# Patient Record
Sex: Female | Born: 1935 | Race: White | Hispanic: No | Marital: Married | Smoking: Never smoker
Health system: Southern US, Community
[De-identification: ages and names within clinical notes are randomized; demographics above are authoritative.]

## PROBLEM LIST (undated history)

## (undated) DIAGNOSIS — I639 Cerebral infarction, unspecified: Secondary | ICD-10-CM

## (undated) DIAGNOSIS — E113593 Type 2 diabetes mellitus with proliferative diabetic retinopathy without macular edema, bilateral: Secondary | ICD-10-CM

## (undated) DIAGNOSIS — N390 Urinary tract infection, site not specified: Secondary | ICD-10-CM

## (undated) DIAGNOSIS — I350 Nonrheumatic aortic (valve) stenosis: Secondary | ICD-10-CM

## (undated) DIAGNOSIS — G459 Transient cerebral ischemic attack, unspecified: Secondary | ICD-10-CM

## (undated) DIAGNOSIS — G5600 Carpal tunnel syndrome, unspecified upper limb: Secondary | ICD-10-CM

## (undated) DIAGNOSIS — E785 Hyperlipidemia, unspecified: Secondary | ICD-10-CM

## (undated) DIAGNOSIS — S52532A Colles' fracture of left radius, initial encounter for closed fracture: Secondary | ICD-10-CM

## (undated) DIAGNOSIS — M1990 Unspecified osteoarthritis, unspecified site: Secondary | ICD-10-CM

## (undated) DIAGNOSIS — M21372 Foot drop, left foot: Secondary | ICD-10-CM

## (undated) DIAGNOSIS — I10 Essential (primary) hypertension: Secondary | ICD-10-CM

## (undated) DIAGNOSIS — K3189 Other diseases of stomach and duodenum: Secondary | ICD-10-CM

## (undated) DIAGNOSIS — F0781 Postconcussional syndrome: Secondary | ICD-10-CM

## (undated) DIAGNOSIS — I509 Heart failure, unspecified: Secondary | ICD-10-CM

## (undated) DIAGNOSIS — K920 Hematemesis: Secondary | ICD-10-CM

## (undated) DIAGNOSIS — E039 Hypothyroidism, unspecified: Secondary | ICD-10-CM

## (undated) DIAGNOSIS — I219 Acute myocardial infarction, unspecified: Secondary | ICD-10-CM

## (undated) DIAGNOSIS — N189 Chronic kidney disease, unspecified: Secondary | ICD-10-CM

## (undated) DIAGNOSIS — C44310 Basal cell carcinoma of skin of unspecified parts of face: Secondary | ICD-10-CM

## (undated) DIAGNOSIS — E119 Type 2 diabetes mellitus without complications: Secondary | ICD-10-CM

## (undated) DIAGNOSIS — I8500 Esophageal varices without bleeding: Secondary | ICD-10-CM

## (undated) DIAGNOSIS — I2510 Atherosclerotic heart disease of native coronary artery without angina pectoris: Secondary | ICD-10-CM

## (undated) DIAGNOSIS — K5710 Diverticulosis of small intestine without perforation or abscess without bleeding: Secondary | ICD-10-CM

## (undated) DIAGNOSIS — H3530 Unspecified macular degeneration: Secondary | ICD-10-CM

## (undated) DIAGNOSIS — E118 Type 2 diabetes mellitus with unspecified complications: Secondary | ICD-10-CM

## (undated) HISTORY — PX: FRACTURE SURGERY: SHX138

## (undated) HISTORY — DX: Nonrheumatic aortic (valve) stenosis: I35.0

## (undated) HISTORY — DX: Acute myocardial infarction, unspecified: I21.9

## (undated) HISTORY — DX: Unspecified osteoarthritis, unspecified site: M19.90

## (undated) HISTORY — DX: Carpal tunnel syndrome, unspecified upper limb: G56.00

## (undated) HISTORY — PX: TOTAL ABDOMINAL HYSTERECTOMY: SHX209

## (undated) HISTORY — PX: TOTAL KNEE ARTHROPLASTY: SHX125

## (undated) HISTORY — PX: APPENDECTOMY: SHX54

## (undated) HISTORY — DX: Hyperlipidemia, unspecified: E78.5

## (undated) HISTORY — DX: Type 2 diabetes mellitus without complications: E11.9

## (undated) HISTORY — DX: Essential (primary) hypertension: I10

## (undated) HISTORY — PX: HERNIA REPAIR: SHX51

## (undated) HISTORY — DX: Transient cerebral ischemic attack, unspecified: G45.9

## (undated) HISTORY — DX: Atherosclerotic heart disease of native coronary artery without angina pectoris: I25.10

## (undated) HISTORY — PX: ABDOMINAL HYSTERECTOMY: SHX81

## (undated) HISTORY — PX: JOINT REPLACEMENT: SHX530

## (undated) HISTORY — PX: CATARACT EXTRACTION: SUR2

## (undated) SURGERY — EGD (ESOPHAGOGASTRODUODENOSCOPY)
Anesthesia: Moderate Sedation

---
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
## 1999-01-15 ENCOUNTER — Inpatient Hospital Stay (HOSPITAL_COMMUNITY): Admission: EM | Admit: 1999-01-15 | Discharge: 1999-01-17 | Payer: Self-pay | Admitting: Emergency Medicine

## 1999-01-15 ENCOUNTER — Encounter: Payer: Self-pay | Admitting: Emergency Medicine

## 1999-01-17 ENCOUNTER — Encounter: Payer: Self-pay | Admitting: Emergency Medicine

## 1999-02-02 ENCOUNTER — Encounter: Payer: Self-pay | Admitting: Emergency Medicine

## 1999-02-02 ENCOUNTER — Inpatient Hospital Stay (HOSPITAL_COMMUNITY): Admission: EM | Admit: 1999-02-02 | Discharge: 1999-02-04 | Payer: Self-pay | Admitting: Emergency Medicine

## 1999-02-03 ENCOUNTER — Encounter: Payer: Self-pay | Admitting: Internal Medicine

## 1999-03-17 ENCOUNTER — Emergency Department (HOSPITAL_COMMUNITY): Admission: EM | Admit: 1999-03-17 | Discharge: 1999-03-17 | Payer: Self-pay | Admitting: Emergency Medicine

## 1999-03-17 ENCOUNTER — Encounter: Payer: Self-pay | Admitting: Emergency Medicine

## 1999-03-22 ENCOUNTER — Emergency Department (HOSPITAL_COMMUNITY): Admission: EM | Admit: 1999-03-22 | Discharge: 1999-03-22 | Payer: Self-pay | Admitting: *Deleted

## 1999-03-22 ENCOUNTER — Encounter: Payer: Self-pay | Admitting: Orthopedic Surgery

## 1999-03-22 ENCOUNTER — Ambulatory Visit (HOSPITAL_COMMUNITY): Admission: RE | Admit: 1999-03-22 | Discharge: 1999-03-22 | Payer: Self-pay | Admitting: Orthopedic Surgery

## 1999-03-23 ENCOUNTER — Emergency Department (HOSPITAL_COMMUNITY): Admission: EM | Admit: 1999-03-23 | Discharge: 1999-03-23 | Payer: Self-pay

## 1999-05-17 HISTORY — PX: HUMERUS FRACTURE SURGERY: SHX670

## 1999-05-21 ENCOUNTER — Ambulatory Visit (HOSPITAL_COMMUNITY): Admission: RE | Admit: 1999-05-21 | Discharge: 1999-05-21 | Payer: Self-pay | Admitting: Orthopedic Surgery

## 1999-05-21 ENCOUNTER — Encounter: Payer: Self-pay | Admitting: Orthopedic Surgery

## 1999-12-09 ENCOUNTER — Encounter: Admission: RE | Admit: 1999-12-09 | Discharge: 1999-12-09 | Payer: Self-pay | Admitting: Family Medicine

## 1999-12-09 ENCOUNTER — Encounter: Payer: Self-pay | Admitting: Family Medicine

## 2000-10-24 ENCOUNTER — Encounter: Payer: Self-pay | Admitting: Cardiology

## 2000-10-24 ENCOUNTER — Ambulatory Visit (HOSPITAL_COMMUNITY): Admission: RE | Admit: 2000-10-24 | Discharge: 2000-10-24 | Payer: Self-pay | Admitting: Cardiology

## 2001-06-06 ENCOUNTER — Encounter: Admission: RE | Admit: 2001-06-06 | Discharge: 2001-09-04 | Payer: Self-pay | Admitting: Internal Medicine

## 2001-09-13 HISTORY — PX: LAPAROSCOPIC CHOLECYSTECTOMY: SUR755

## 2001-10-01 ENCOUNTER — Ambulatory Visit (HOSPITAL_COMMUNITY): Admission: RE | Admit: 2001-10-01 | Discharge: 2001-10-01 | Payer: Self-pay | Admitting: Internal Medicine

## 2001-10-01 ENCOUNTER — Encounter (INDEPENDENT_AMBULATORY_CARE_PROVIDER_SITE_OTHER): Payer: Self-pay | Admitting: *Deleted

## 2001-10-01 ENCOUNTER — Inpatient Hospital Stay (HOSPITAL_COMMUNITY): Admission: EM | Admit: 2001-10-01 | Discharge: 2001-10-03 | Payer: Self-pay | Admitting: Emergency Medicine

## 2001-10-01 ENCOUNTER — Encounter: Payer: Self-pay | Admitting: Emergency Medicine

## 2001-10-01 ENCOUNTER — Encounter: Payer: Self-pay | Admitting: Internal Medicine

## 2001-10-02 ENCOUNTER — Encounter: Payer: Self-pay | Admitting: Surgery

## 2001-10-02 HISTORY — PX: LAPAROSCOPIC CHOLECYSTECTOMY: SUR755

## 2002-03-16 HISTORY — PX: LAPAROSCOPIC INCISIONAL / UMBILICAL / VENTRAL HERNIA REPAIR: SUR789

## 2002-03-26 ENCOUNTER — Inpatient Hospital Stay (HOSPITAL_COMMUNITY): Admission: RE | Admit: 2002-03-26 | Discharge: 2002-03-28 | Payer: Self-pay | Admitting: Surgery

## 2002-03-26 HISTORY — PX: LAPAROSCOPIC INCISIONAL / UMBILICAL / VENTRAL HERNIA REPAIR: SUR789

## 2004-03-18 ENCOUNTER — Ambulatory Visit: Payer: Self-pay | Admitting: Cardiology

## 2004-05-19 ENCOUNTER — Ambulatory Visit: Payer: Self-pay | Admitting: Family Medicine

## 2004-05-20 ENCOUNTER — Ambulatory Visit: Payer: Self-pay | Admitting: Family Medicine

## 2004-05-25 ENCOUNTER — Ambulatory Visit: Payer: Self-pay | Admitting: Family Medicine

## 2004-05-31 ENCOUNTER — Ambulatory Visit: Payer: Self-pay | Admitting: Cardiology

## 2004-05-31 ENCOUNTER — Encounter: Payer: Self-pay | Admitting: Family Medicine

## 2004-08-13 ENCOUNTER — Ambulatory Visit: Payer: Self-pay | Admitting: Cardiology

## 2004-08-31 ENCOUNTER — Ambulatory Visit: Payer: Self-pay | Admitting: Family Medicine

## 2004-09-01 ENCOUNTER — Ambulatory Visit: Payer: Self-pay | Admitting: Family Medicine

## 2004-12-24 ENCOUNTER — Ambulatory Visit: Payer: Self-pay | Admitting: Cardiology

## 2005-01-10 ENCOUNTER — Ambulatory Visit: Payer: Self-pay

## 2005-01-26 ENCOUNTER — Ambulatory Visit: Payer: Self-pay | Admitting: Cardiology

## 2005-07-20 ENCOUNTER — Ambulatory Visit: Payer: Self-pay | Admitting: Cardiology

## 2005-09-13 DIAGNOSIS — I2510 Atherosclerotic heart disease of native coronary artery without angina pectoris: Secondary | ICD-10-CM | POA: Diagnosis present

## 2005-09-13 HISTORY — PX: CORONARY ANGIOPLASTY WITH STENT PLACEMENT: SHX49

## 2005-09-13 HISTORY — DX: Atherosclerotic heart disease of native coronary artery without angina pectoris: I25.10

## 2005-10-04 ENCOUNTER — Inpatient Hospital Stay (HOSPITAL_COMMUNITY): Admission: EM | Admit: 2005-10-04 | Discharge: 2005-10-05 | Payer: Self-pay | Admitting: Emergency Medicine

## 2005-10-04 ENCOUNTER — Ambulatory Visit: Payer: Self-pay | Admitting: Cardiovascular Disease

## 2005-10-06 ENCOUNTER — Ambulatory Visit: Payer: Self-pay | Admitting: Cardiology

## 2005-10-06 ENCOUNTER — Inpatient Hospital Stay (HOSPITAL_COMMUNITY): Admission: EM | Admit: 2005-10-06 | Discharge: 2005-10-08 | Payer: Self-pay | Admitting: Emergency Medicine

## 2005-11-01 ENCOUNTER — Ambulatory Visit: Payer: Self-pay | Admitting: Cardiology

## 2005-12-27 ENCOUNTER — Ambulatory Visit (HOSPITAL_COMMUNITY): Admission: RE | Admit: 2005-12-27 | Discharge: 2005-12-27 | Payer: Self-pay | Admitting: Neurology

## 2006-01-23 ENCOUNTER — Encounter: Admission: RE | Admit: 2006-01-23 | Discharge: 2006-01-23 | Payer: Self-pay | Admitting: Family Medicine

## 2006-03-02 ENCOUNTER — Ambulatory Visit: Payer: Self-pay | Admitting: Cardiology

## 2006-03-06 ENCOUNTER — Encounter: Admission: RE | Admit: 2006-03-06 | Discharge: 2006-03-06 | Payer: Self-pay | Admitting: Internal Medicine

## 2006-04-10 ENCOUNTER — Ambulatory Visit: Payer: Self-pay | Admitting: Cardiology

## 2006-04-29 ENCOUNTER — Encounter: Admission: RE | Admit: 2006-04-29 | Discharge: 2006-04-29 | Payer: Self-pay | Admitting: Ophthalmology

## 2006-05-17 ENCOUNTER — Encounter: Admission: RE | Admit: 2006-05-17 | Discharge: 2006-05-17 | Payer: Self-pay | Admitting: Neurology

## 2006-06-14 ENCOUNTER — Ambulatory Visit: Payer: Self-pay

## 2006-06-20 ENCOUNTER — Ambulatory Visit: Payer: Self-pay | Admitting: Cardiology

## 2006-10-11 ENCOUNTER — Ambulatory Visit: Payer: Self-pay | Admitting: Cardiology

## 2006-10-16 ENCOUNTER — Emergency Department (HOSPITAL_COMMUNITY): Admission: EM | Admit: 2006-10-16 | Discharge: 2006-10-16 | Payer: Self-pay | Admitting: Emergency Medicine

## 2006-12-25 ENCOUNTER — Ambulatory Visit: Payer: Self-pay | Admitting: Cardiology

## 2007-01-01 ENCOUNTER — Inpatient Hospital Stay (HOSPITAL_COMMUNITY): Admission: RE | Admit: 2007-01-01 | Discharge: 2007-01-06 | Payer: Self-pay | Admitting: Orthopedic Surgery

## 2007-01-03 DIAGNOSIS — I2510 Atherosclerotic heart disease of native coronary artery without angina pectoris: Secondary | ICD-10-CM | POA: Insufficient documentation

## 2007-01-03 DIAGNOSIS — Z794 Long term (current) use of insulin: Secondary | ICD-10-CM

## 2007-01-03 DIAGNOSIS — E785 Hyperlipidemia, unspecified: Secondary | ICD-10-CM | POA: Insufficient documentation

## 2007-01-03 DIAGNOSIS — I10 Essential (primary) hypertension: Secondary | ICD-10-CM | POA: Insufficient documentation

## 2007-01-03 DIAGNOSIS — E119 Type 2 diabetes mellitus without complications: Secondary | ICD-10-CM

## 2007-01-07 ENCOUNTER — Emergency Department (HOSPITAL_COMMUNITY): Admission: EM | Admit: 2007-01-07 | Discharge: 2007-01-07 | Payer: Self-pay | Admitting: *Deleted

## 2007-01-14 ENCOUNTER — Ambulatory Visit: Payer: Self-pay | Admitting: Cardiology

## 2007-01-14 ENCOUNTER — Inpatient Hospital Stay (HOSPITAL_COMMUNITY): Admission: EM | Admit: 2007-01-14 | Discharge: 2007-01-17 | Payer: Self-pay | Admitting: Emergency Medicine

## 2007-01-29 ENCOUNTER — Ambulatory Visit: Payer: Self-pay | Admitting: Internal Medicine

## 2007-02-13 ENCOUNTER — Ambulatory Visit: Payer: Self-pay | Admitting: Cardiology

## 2007-03-26 ENCOUNTER — Emergency Department (HOSPITAL_COMMUNITY): Admission: EM | Admit: 2007-03-26 | Discharge: 2007-03-26 | Payer: Self-pay | Admitting: Emergency Medicine

## 2007-04-03 ENCOUNTER — Ambulatory Visit: Payer: Self-pay | Admitting: Cardiology

## 2007-05-17 HISTORY — PX: MEDIAN NERVE REPAIR: SHX2024

## 2007-07-30 ENCOUNTER — Ambulatory Visit: Payer: Self-pay | Admitting: Cardiology

## 2007-07-30 LAB — CONVERTED CEMR LAB
BUN: 13 mg/dL (ref 6–23)
Calcium: 9.3 mg/dL (ref 8.4–10.5)
Creatinine, Ser: 0.8 mg/dL (ref 0.4–1.2)
GFR calc Af Amer: 91 mL/min
Sodium: 139 meq/L (ref 135–145)

## 2008-02-12 ENCOUNTER — Ambulatory Visit (HOSPITAL_BASED_OUTPATIENT_CLINIC_OR_DEPARTMENT_OTHER): Admission: RE | Admit: 2008-02-12 | Discharge: 2008-02-12 | Payer: Self-pay | Admitting: Orthopedic Surgery

## 2008-02-19 ENCOUNTER — Ambulatory Visit: Payer: Self-pay | Admitting: Cardiology

## 2008-03-11 ENCOUNTER — Ambulatory Visit (HOSPITAL_BASED_OUTPATIENT_CLINIC_OR_DEPARTMENT_OTHER): Admission: RE | Admit: 2008-03-11 | Discharge: 2008-03-11 | Payer: Self-pay | Admitting: Orthopedic Surgery

## 2008-03-24 ENCOUNTER — Telehealth: Payer: Self-pay | Admitting: Family Medicine

## 2008-04-08 ENCOUNTER — Encounter: Payer: Self-pay | Admitting: Cardiology

## 2008-08-26 DIAGNOSIS — M1990 Unspecified osteoarthritis, unspecified site: Secondary | ICD-10-CM | POA: Insufficient documentation

## 2008-08-26 DIAGNOSIS — E039 Hypothyroidism, unspecified: Secondary | ICD-10-CM | POA: Insufficient documentation

## 2008-08-27 ENCOUNTER — Encounter: Payer: Self-pay | Admitting: Cardiology

## 2008-08-27 ENCOUNTER — Ambulatory Visit: Payer: Self-pay | Admitting: Cardiology

## 2008-10-27 ENCOUNTER — Encounter (INDEPENDENT_AMBULATORY_CARE_PROVIDER_SITE_OTHER): Payer: Self-pay | Admitting: *Deleted

## 2009-01-26 ENCOUNTER — Telehealth: Payer: Self-pay | Admitting: Cardiology

## 2009-01-27 ENCOUNTER — Encounter (INDEPENDENT_AMBULATORY_CARE_PROVIDER_SITE_OTHER): Payer: Self-pay | Admitting: *Deleted

## 2009-02-04 ENCOUNTER — Telehealth: Payer: Self-pay | Admitting: Cardiology

## 2009-03-11 ENCOUNTER — Ambulatory Visit: Payer: Self-pay | Admitting: Cardiology

## 2009-04-03 ENCOUNTER — Encounter: Admission: RE | Admit: 2009-04-03 | Discharge: 2009-04-03 | Payer: Self-pay | Admitting: Internal Medicine

## 2009-04-13 ENCOUNTER — Encounter: Payer: Self-pay | Admitting: Cardiology

## 2009-05-16 DIAGNOSIS — I219 Acute myocardial infarction, unspecified: Secondary | ICD-10-CM

## 2009-05-16 HISTORY — DX: Acute myocardial infarction, unspecified: I21.9

## 2009-08-19 ENCOUNTER — Telehealth (INDEPENDENT_AMBULATORY_CARE_PROVIDER_SITE_OTHER): Payer: Self-pay | Admitting: *Deleted

## 2009-10-28 ENCOUNTER — Ambulatory Visit: Payer: Self-pay

## 2009-10-28 ENCOUNTER — Encounter: Payer: Self-pay | Admitting: Cardiology

## 2009-10-28 DIAGNOSIS — M542 Cervicalgia: Secondary | ICD-10-CM | POA: Insufficient documentation

## 2010-03-03 ENCOUNTER — Ambulatory Visit: Payer: Self-pay | Admitting: Cardiology

## 2010-03-12 ENCOUNTER — Ambulatory Visit: Payer: Self-pay | Admitting: Internal Medicine

## 2010-03-12 ENCOUNTER — Ambulatory Visit: Payer: Self-pay

## 2010-03-12 ENCOUNTER — Encounter: Payer: Self-pay | Admitting: Cardiology

## 2010-03-12 ENCOUNTER — Ambulatory Visit (HOSPITAL_COMMUNITY): Admission: RE | Admit: 2010-03-12 | Discharge: 2010-03-12 | Payer: Self-pay | Admitting: Cardiology

## 2010-03-16 ENCOUNTER — Encounter: Payer: Self-pay | Admitting: Cardiology

## 2010-04-26 ENCOUNTER — Telehealth (INDEPENDENT_AMBULATORY_CARE_PROVIDER_SITE_OTHER): Payer: Self-pay | Admitting: *Deleted

## 2010-05-03 ENCOUNTER — Ambulatory Visit (HOSPITAL_COMMUNITY)
Admission: RE | Admit: 2010-05-03 | Discharge: 2010-05-11 | Disposition: A | Discharge status: Rehabilitation Facility | Payer: Self-pay | Source: Home / Self Care | Attending: Cardiology | Admitting: Cardiology

## 2010-05-03 ENCOUNTER — Inpatient Hospital Stay (HOSPITAL_COMMUNITY)
Admission: RE | Admit: 2010-05-03 | Discharge: 2010-05-11 | Disposition: A | Discharge status: Rehabilitation Facility | Payer: Self-pay | Source: Home / Self Care | Attending: Orthopedic Surgery | Admitting: Orthopedic Surgery

## 2010-05-03 ENCOUNTER — Encounter: Payer: Self-pay | Admitting: Cardiology

## 2010-05-11 ENCOUNTER — Inpatient Hospital Stay (HOSPITAL_COMMUNITY)
Admission: RE | Admit: 2010-05-11 | Discharge: 2010-05-18 | Payer: Self-pay | Attending: Physical Medicine & Rehabilitation | Admitting: Physical Medicine & Rehabilitation

## 2010-05-16 DIAGNOSIS — I219 Acute myocardial infarction, unspecified: Secondary | ICD-10-CM

## 2010-05-16 HISTORY — DX: Acute myocardial infarction, unspecified: I21.9

## 2010-05-31 ENCOUNTER — Encounter (INDEPENDENT_AMBULATORY_CARE_PROVIDER_SITE_OTHER): Payer: Self-pay | Admitting: *Deleted

## 2010-06-01 ENCOUNTER — Encounter: Payer: Self-pay | Admitting: Cardiology

## 2010-06-01 ENCOUNTER — Other Ambulatory Visit: Payer: Self-pay | Admitting: Cardiology

## 2010-06-01 ENCOUNTER — Ambulatory Visit
Admission: RE | Admit: 2010-06-01 | Discharge: 2010-06-01 | Payer: Self-pay | Source: Home / Self Care | Attending: Cardiology | Admitting: Cardiology

## 2010-06-02 LAB — BASIC METABOLIC PANEL
BUN: 15 mg/dL (ref 6–23)
CO2: 24 mEq/L (ref 19–32)
Calcium: 9.2 mg/dL (ref 8.4–10.5)
Chloride: 106 mEq/L (ref 96–112)
Creatinine, Ser: 0.9 mg/dL (ref 0.4–1.2)
GFR: 68.62 mL/min (ref >60.00)
Glucose, Bld: 194 mg/dL — ABNORMAL HIGH (ref 70–99)
Potassium: 4.5 mEq/L (ref 3.5–5.1)
Sodium: 139 mEq/L (ref 135–145)

## 2010-06-02 LAB — CBC WITH DIFFERENTIAL/PLATELET
Basophils Absolute: 0 10*3/uL (ref 0.0–0.1)
Basophils Relative: 0.1 % (ref 0.0–3.0)
Eosinophils Absolute: 0.2 10*3/uL (ref 0.0–0.7)
Eosinophils Relative: 3.3 % (ref 0.0–5.0)
HCT: 37.4 % (ref 36.0–46.0)
Hemoglobin: 12.7 g/dL (ref 12.0–15.0)
Lymphocytes Relative: 38.6 % (ref 12.0–46.0)
Lymphs Abs: 2.8 10*3/uL (ref 0.7–4.0)
MCHC: 33.8 g/dL (ref 30.0–36.0)
MCV: 95.7 fl (ref 78.0–100.0)
Monocytes Absolute: 0.1 10*3/uL (ref 0.1–1.0)
Monocytes Relative: 1.4 % — ABNORMAL LOW (ref 3.0–12.0)
Neutro Abs: 4.1 10*3/uL (ref 1.4–7.7)
Neutrophils Relative %: 56.6 % (ref 43.0–77.0)
Platelets: 229 10*3/uL (ref 150.0–400.0)
RBC: 3.91 Mil/uL (ref 3.87–5.11)
RDW: 15.2 % — ABNORMAL HIGH (ref 11.5–14.6)
WBC: 7.2 10*3/uL (ref 4.5–10.5)

## 2010-06-03 ENCOUNTER — Telehealth: Payer: Self-pay | Admitting: Cardiology

## 2010-06-04 NOTE — Discharge Summary (Signed)
Tonya Hunter, SPILLMAN NO.:  [id number]  MEDICAL RECORD NO.:  [id number]          PATIENT TYPE:  IPS  LOCATION:  3299                         FACILITY:  [facility]  PHYSICIAN:  Meredith Staggers, M.D.DATE OF BIRTH:  March 02, 1935  DATE OF ADMISSION:  05/11/2010 DATE OF DISCHARGE:  05/18/2010                              DISCHARGE SUMMARY   DISCHARGE DIAGNOSES: 1. End-stage osteoarthritis, left knee requiring left total knee     replacement. 2. Postop ST-elevation myocardial infarction with stent placement. 3. Hypertension. 4. Dyslipidemia. 5. Diabetes mellitus. 6. Acute blood loss anemia.  HISTORY OF PRESENT ILLNESS:  [name] is a 76 year old female with history of DM type 2, coronary artery disease, OA left knee who elected to undergo left total knee replacement on May 03, 2010 by Dr. Wynelle Link.  In PACU, the patient developed chest pain with ST changes due to STEMI.  She was taken to cath lab emergently for PTCA stent of LAD by Dr. Lia Foyer as treatment for her anterior wall MI.  Postprocedure, the patient is on aspirin and Plavix.  She also continues on subcu Lovenox for DVT prophylaxis.  She has had issues with pain and numbness in left calf past 24 hours but bilateral lower extremity Dopplers done showed no evidence of DVT.  Therapies were initiated and the patient is noted to have decreased posture requiring cues to weight shift on lower right lower extremity.  Continues to have issues with elevated anxiety level. The patient was evaluated by rehab and felt that she would benefit from a CIR program.  PAST MEDICAL HISTORY: 1. Coronary artery disease with PTCA 4 years ago. 2. DM type 2. 3. Hypothyroid. 4. Hysterectomy and BSO. 5. Right total knee replacement in 2008. 6. Bilateral carpal tunnel release. 7. Appendectomy. 8. Diverticulosis. 9. Cholecystectomy. 10.Left retinal tear repair. 11.Left shoulder repair. 12.Ventral hernia repair.  ALLERGIES:   PENICILLIN and DEMEROL.  REVIEW OF SYMPTOMS:  Positive for weakness, insomnia, anxiety, and constipation.  FAMILY HISTORY:  Positive for diabetes, coronary artery disease, and cancer.  SOCIAL HISTORY:  The patient is married, lives in 1-level home with two steps at entry.  Does not use any tobacco or alcohol.  Has a supportive husband.  FUNCTIONAL HISTORY:  The patient was independent prior to admission without assisted device.  FUNCTIONAL STATUS:  The patient is min assist transfers.  Min assist ambulating 15-33 feet with a rolling walker.  Min guard assist for upper body care, mod to total assist lower body care.  She has 55 degrees flexion with 5 degrees extension on left lower extremity.  HOSPITAL COURSE:  Tonya Hunter was admitted to rehab on May 11, 2010 for inpatient therapies to consist of PT, OT at least 3 hours 5 days a week.  Past admission, physiatrist, rehab RN, and therapy team have worked together to provide customized and collaborative interdisciplinary care.  The patient's blood pressures were checked on b.i.d. basis during this stay.  These have shown reasonable control and have ranged from 242A-834H systolics with an occasional high at 140s. Diastolic in 96Q-22L range.  Heart rate has been stable in  60-70 range. The patient's p.o. intake has been monitored along and is slowly been improving.  Blood sugars were monitored with before meals and at bedtime CBG checks.  Her Lantus insulin was titrated up to her home dose. Sliding scale insulin NovoLog was used for elevated blood sugars. Currently blood sugars are ranging from 120s-160s.  The patient reports using 50 units of NovoLog q.a.m. only.  She was advised that this would be too higher dose and wondered whether she needed to split this dose. She is advised to hold her NovoLog for now.  Check blood sugars on before meals and at bedtime basis and contact Dr. Jacquiline Doe office for adjustment in her  hypoglycemic regimen.  The patient's knee incision has been monitored along.  She was noted to have a deep edema with ecchymosis, left proximal thigh and left calf.  Left knee incision is clean, dry, and intact with Steri-Strips in place.  Edema in left knee and ecchymosis much improved by the time of discharge.  Pain in left calf has also almost resolved.  The patient has had some issues with chest discomfort with attempts at range of motion on left knee.  [facility] was contacted for input.  EKG done showed no significant changes.  PY2 platelet assay was done showing 95% inhibition. Cardiology felt that the patient's symptoms are more due to anxiety and eager support has been ongoing throughout her stay.  The patient continues to use Xanax on b.i.d. p.r.n. basis.  She is advised to follow up with Dr. [name] for long-term antianxiety medicine if her anxiety does not improve. Labs done past admission reveal sodium 136, potassium 3.9, chloride 102, CO2 27, glucose 114, BUN 9, Chloride 27, alkaline phosphatase 152, albumin 2.6.  CBC revealed hemoglobin 9.4, hematocrit 28.9, WBC 9.3 and platelets 363.  During the patient's stay in rehab, weekly team conference was held to monitor the patient's progress, set goals as well as discuss barriers to discharge.  At admission, the patient was limited by significant left knee pain and contracture, left ankle pain with contracture as well as left lower extremity edema.  She was noted to have decreased muscular endurance, decreased activity tolerance as well as decrease in left upper and lower extremity strength.  The patient was at supervision level for bed mobility to low supervision for ambulating short distances.  She required min assist to navigate around obstacles.  She did require cuing to get the left heel flat on the floor to help with her overall mobility.  The patient progressed to being modified independent for ambulation in a  controlled area.  She is able to navigate 8 steps with supervision and cuing for sequencing.  The patient declined attempts to progress to a cane.  Family education has been done with husband and the patient discharged to home at modified independent level for mobility in a controlled setting.  OT has worked with the patient on self-care tasks.  The patient was overall at supervision level for ADLs.  Attempts were made to get the patient into the shower to help progress towards independence in bathing and dressing.  The patient has declined to do this and has preferred to do sponge baths. Attempts were also made to progress the patient to home tasks.  However, the patient has declined this as she reports her husband will do these activities.  Currently, the patient is modified independent for toileting and toilet transfers.  She will have 24-hour supervision assistance from husband for her ADL  tasks.  Further followup home health PT, OT has been set up through [facility].  The patient was maintained on subcu Lovenox throughout her stay.  It was discontinued at the time of discharge.  Home Health RN has been arranged for wound care monitoring.  On May 18, 2010, the patient is discharged to home.  DISCHARGE MEDICATIONS: 1. Xanax 0.25 mg b.i.d. p.r.n. 2. Ecotrin 325 mg per day. 3. Nu-Iron 150 mg b.i.d. 4. Robaxin 500 mg p.o. q.6 h. p.r.n. spasms. 5. Percocet 5/325 one p.o. q.4 h. p.r.n. moderate-to-severe pain, #50     Rx. 6. Senokot-S 2 p.o. at bedtime. 7. Lasix 40 mg 1/2 p.o. per day. 8. Metoprolol 50 mg p.o. b.i.d., note increased dose. 9. KCl 10 mEq 2 tablets p.o. per day. 10.Lantus insulin 15 units subcu at bedtime. 11.Levothyroxine 200 mcg p.o. per day. 12.Metformin XR 1000 mg p.o. at bedtime. 13.Plavix 75 mg a day. 14.Vytorin 10-20 p.o. per day.  DIET:  Carb-modified medium.  ACTIVITY LEVEL:  Supervision for mobility, assistance with ADLs. Continue to  use walker for now.  SPECIAL INSTRUCTIONS:  No alcohol, no smoking, and no driving.  Do not use Humalog insulin for now.  Do not use Imdur.  [facility] to provide PT, OT, and RN.  Check blood sugars before meals and at bedtime basis for now.  FOLLOWUP:  The patient is to follow up with Dr. Naaman Plummer as needed. Follow up with Dr. Wynelle Link for postop check.  Follow up with Dr. Lia Foyer in 2 weeks.  Follow up with Dr. [name] in the next couple of weeks for input regarding diabetes management as well as issues regarding anxiety if these continue.     [name], P.A.   ______________________________ Meredith Staggers, M.D.    PL/MEDQ  D:  05/19/2010  T:  05/19/2010  Job:  [id number]  cc:   [name], M.D. Loretha Brasil. Lia Foyer, MD, [facility] Gaynelle Arabian, M.D.  Electronically Signed by Joline Maxcy. on 05/20/2010 08:46:36 AM Electronically Signed by Alger Simons M.D. on 06/04/2010 09:53:16 AM

## 2010-06-15 NOTE — Assessment & Plan Note (Signed)
Summary: f1y   Visit Type:  1 year follow up Primary Provider:  [name]  CC:  Sob.  History of Present Illness: Doing well. Denies chest pain.  Has mild shortness of breath with exertion.  Overall her leg is so bad she cannot go on any further.   Does not want to have another myoview  "nearly killed me".  No other major symptoms.  Current Medications (verified): 1)  Metoprolol Tartrate 50 Mg Tabs (Metoprolol Tartrate) .Marland Kitchen.. 1 Tablet in The Morning and 1/2 Tablet in The Afternoon 2)  Isosorbide Mononitrate Cr 60 Mg  Tb24 (Isosorbide Mononitrate) .... Take 1 Tablet By Mouth Once A Day 3)  Furosemide 40 Mg  Tabs (Furosemide) .... Take 1 Tablet By Mouth Once A Day 4)  Synthroid 200 Mcg  Tabs (Levothyroxine Sodium) .... Take 1 Tablet By Mouth Once A Day 5)  Humalog 100 Unit/ml Soln (Insulin Lispro (Human)) .... 50 Units Every Morning 6)  Vytorin 10-20 Mg Tabs (Ezetimibe-Simvastatin) .... Take One Tablet By Mouth Daily At Bedtime 7)  Plavix 75 Mg Tabs (Clopidogrel Bisulfate) .... Take One Tablet By Mouth Daily 8)  Metformin Hcl 500 Mg Tabs (Metformin Hcl) .... Take 4 Tablets At Night 9)  Lantus 100 Unit/ml Soln (Insulin Glargine) .... 50 Units At Night 10)  Aspirin Ec 325 Mg Tbec (Aspirin) .... Take One Tablet By Mouth Daily 11)  Potassium Chloride Cr 10 Meq Cr-Caps (Potassium Chloride) .... Take One Tablet By Mouth Daily 12)  Nitrostat 0.4 Mg Subl (Nitroglycerin) .Marland Kitchen.. 1 Tablet Under Tongue At Onset of Chest Pain; You May Repeat Every 5 Minutes For Up To 3 Doses.  Allergies: 1)  ! Pcn 2)  ! Demerol  Past History:  Past Medical History: Last updated: 08/26/2008 CORONARY ARTERY DISEASE (ICD-414.00)-status post Taxus drug-eluting stent  placement to the left anterior descending artery in May 2007. Marland Kitchen Hypertension.  DIABETES MELLITUS, TYPE II (ICD-250.00) HYPERLIPIDEMIA (ICD-272.4) HYPERTENSION (ICD-401.9) OSTEOARTHRITIS (ICD-715.90) HYPOTHYROIDISM, HX OF (ICD-V12.2) FAMILY HISTORY  DIABETES 1ST DEGREE RELATIVE (ICD-V18.0)  Past Surgical History: Last updated: 08/26/2008  Decompression, right median nerve- 2009  Decompression, left median nerve-2009 :appendectomy,  ,cataract  surgery,  cholecystectomy, hernia   repair, hysterectomy, knee surgery  Taxus drug-eluting stent   placement to the left anterior descending artery in May 2007.         Family History: Last updated: 08/26/2008 Family History Diabetes 1st degree relative Family History Hypertension  Is insignificant for premature coronary disease.      Social History: Last updated: 08/26/2008 The patient is married.  She has two children.  She   denies tobacco or alcohol abuse.  She is an Web designer at   [employer].   Vital Signs:  Patient profile:   76 year old female Height:      66 inches Weight:      222.50 pounds BMI:     36.04 Pulse rate:   66 / minute Pulse rhythm:   regular Resp:     18 per minute BP sitting:   129 / 69  (left arm) Cuff size:   large  Vitals Entered By: Sidney Ace (March 03, 2010 8:54 AM)  Physical Exam  General:  Well developed, well nourished, in no acute distress. Head:  normocephalic and atraumatic Eyes:  PERRLA/EOM intact; conjunctiva and lids normal. Lungs:  Clear bilaterally to auscultation and percussion. Heart:  PMI non displaced.  Normal S1 and S2.  Minimal SEM.  No DM.  Abdomen:  Bowel sounds positive; abdomen  soft and non-tender without masses, organomegaly, or hernias noted. No hepatosplenomegaly. Pulses:  pulses normal in all 4 extremities Extremities:  No clubbing or cyanosis. Neurologic:  Alert and oriented x 3.   Cardiac Cath  Procedure date:  10/06/2005  Findings:       ANGIOGRAPHIC DATA:  1.  The left main is free of critical disease.  2.  The left anterior descending artery is a moderately calcified vessel.      There is about a 40% area of eccentric plaquing just prior to the      stented site which is just after the  diagonal.  Distal to the stent      site, there is some mild luminal irregularity.  Proximal diagonal branch      has about 40-50% narrowing in its ostium as well, although it does not      appear to be critical.  The LAD appears to be slightly worse in the LAO      cranial view, but it does not appear to be any different than at the      time of the procedure itself and I would grade it at no more than 50% as      noted.  3.  The circumflex provides a large first marginal branch whose terminal      branches have subtotal occlusions of 90%.  This is similar to what she      had previously.  It is fairly distal.  The second marginal branch is      large in caliber and free of critical disease.  4.  The right coronary artery also has diffuse luminal irregularity with      some calcification and about 30-40% narrowing distally prior to the PDA      takeoff.  This is eccentric.   EKG  Procedure date:  03/03/2010  Findings:      NSR.  RBBB.  Impression & Recommendations:  Problem # 1:  CORONARY ARTERY DISEASE (ICD-414.00)  Stable.  no incremental symptoms.  She states she has to have surgery, and cannot have any stress  "last one almost killed me".  Therefore, will do echo and review LV.  No high risk features.  She can reduce ASA to 13m, and hold Plavix 5 days before surgery.   Her updated medication list for this problem includes:    Metoprolol Tartrate 50 Mg Tabs (Metoprolol tartrate) ..Marland Kitchen.. 1 tablet in the morning and 1/2 tablet in the afternoon    Isosorbide Mononitrate Cr 60 Mg Tb24 (Isosorbide mononitrate) ..Marland Kitchen.. Take 1 tablet by mouth once a day    Plavix 75 Mg Tabs (Clopidogrel bisulfate) ..Marland Kitchen.. Take one tablet by mouth daily    Aspirin 81 Mg Tbec (Aspirin) ..Marland Kitchen.. Take one tablet by mouth daily    Nitrostat 0.4 Mg Subl (Nitroglycerin) ..Marland Kitchen.. 1 tablet under tongue at onset of chest pain; you may repeat every 5 minutes for up to 3 doses.  Orders: EKG w/ Interpretation  (93000) Echocardiogram (Echo)  Problem # 2:  HYPERLIPIDEMIA (ICD-272.4)  Followed by her primary. Her updated medication list for this problem includes:    Vytorin 10-20 Mg Tabs (Ezetimibe-simvastatin) ..Marland Kitchen.. Take one tablet by mouth daily at bedtime  Her updated medication list for this problem includes:    Vytorin 10-20 Mg Tabs (Ezetimibe-simvastatin) ..Marland Kitchen.. Take one tablet by mouth daily at bedtime  Orders: EKG w/ Interpretation (93000) Echocardiogram (Echo)  Problem # 3:  DIABETES MELLITUS, TYPE II (ICD-250.00) Managed by  Dr. [name]. Her updated medication list for this problem includes:    Humalog 100 Unit/ml Soln (Insulin lispro (human)) .Marland KitchenMarland KitchenMarland KitchenMarland Kitchen 50 units every morning    Metformin Hcl 500 Mg Tabs (Metformin hcl) .Marland Kitchen... Take 4 tablets at night    Lantus 100 Unit/ml Soln (Insulin glargine) .Marland KitchenMarland KitchenMarland KitchenMarland Kitchen 50 units at night    Aspirin 81 Mg Tbec (Aspirin) .Marland Kitchen... Take one tablet by mouth daily  Problem # 4:  OSTEOARTHRITIS (ICD-715.90) requires surgery.  Patient Instructions: 1)  Your physician has recommended you make the following change in your medication: DECREASE Aspirin to 41m once a day 2)  Your physician wants you to follow-up in: 6 MONTHS.  You will receive a reminder letter in the mail two months in advance. If you don't receive a letter, please call our office to schedule the follow-up appointment. 3)  Your physician has requested that you have an echocardiogram.  Echocardiography is a painless test that uses sound waves to create images of your heart. It provides your doctor with information about the size and shape of your heart and how well your heart's chambers and valves are working.  This procedure takes approximately one hour. There are no restrictions for this procedure. 4)  Prior to surgery you may hold your Plavix for 5 days.  Do not stop Aspirin for surgery.

## 2010-06-15 NOTE — Progress Notes (Signed)
Summary: refill meds  Phone Note Refill Request Call back at Home Phone [phone] Message from:  Pharmacy on August 19, 2009 4:18 PM  Refills Requested: Medication #1:  PLAVIX 75 MG TABS Take one tablet by mouth daily [facility] [address] [phone]   Method Requested: Fax to [facility] Initial call taken by: Neil Crouch,  August 19, 2009 4:18 PM  Follow-up for Phone Call        Rx faxed to pharmacy Follow-up by: Sidney Ace,  August 19, 2009 4:42 PM    Prescriptions: PLAVIX 75 MG TABS (CLOPIDOGREL BISULFATE) Take one tablet by mouth daily  #90 x 3   Entered by:   Sidney Ace   Authorized by:   Wadie Lessen, MD, [facility]   Signed by:   Sidney Ace on 08/19/2009   Method used:   Faxed to ...       [facility] [id number] (retail)       [place]       [place],   [address]       Ph: [phone] or [phone]       Fax: [phone]   RxID:   [id number]

## 2010-06-15 NOTE — Assessment & Plan Note (Signed)
Summary: tightness in neck  Nurse Visit   Vital Signs:  Patient profile:   76 year old female Weight:      219 pounds Pulse rate:   78 / minute BP sitting:   120 / 66  (left arm) Cuff size:   large  Vitals Entered By: Theodosia Quay, RN, BSN (October 28, 2009 9:32 AM)  Current Medications (verified): 1)  Metoprolol Tartrate 50 Mg Tabs (Metoprolol Tartrate) .Marland Kitchen.. 1 Tablet in The Morning and 1/2 Tablet in The Afternoon 2)  Isosorbide Mononitrate Cr 60 Mg  Tb24 (Isosorbide Mononitrate) .... Take 1 Tablet By Mouth Once A Day 3)  Furosemide 40 Mg  Tabs (Furosemide) .... Take 1 Tablet By Mouth Once A Day 4)  Synthroid 200 Mcg  Tabs (Levothyroxine Sodium) .... Take 1 Tablet By Mouth Once A Day 5)  Humalog 100 Unit/ml Soln (Insulin Lispro (Human)) .... 50 Units Every Morning 6)  Vytorin 10-20 Mg Tabs (Ezetimibe-Simvastatin) .... Take One Tablet By Mouth Daily At Bedtime 7)  Plavix 75 Mg Tabs (Clopidogrel Bisulfate) .... Take One Tablet By Mouth Daily 8)  Metformin Hcl 500 Mg Tabs (Metformin Hcl) .... Take 4 Tablets At Night 9)  Lantus 100 Unit/ml Soln (Insulin Glargine) .... 50 Units At Night 10)  Aspirin Ec 325 Mg Tbec (Aspirin) .... Take One Tablet By Mouth Daily 11)  Potassium Chloride Cr 10 Meq Cr-Caps (Potassium Chloride) .... Take One Tablet By Mouth Daily 12)  Nitrostat 0.4 Mg Subl (Nitroglycerin) .Marland Kitchen.. 1 Tablet Under Tongue At Onset of Chest Pain; You May Repeat Every 5 Minutes For Up To 3 Doses.  Allergies (verified): 1)  ! Pcn 2)  ! Demerol  Impression & Recommendations:  Problem # 1:  NECK PAIN (ICD-723.1) The pt walked into the office this morning with c/o fatigue and left neck and should pain. The pt denies CP.  The pt developed constant left sided neck and shoulder pain yesterday and as the day progressed her pain worsened.  The pt did take one NTG last night and her pain decreased.   The pt said her neck hurts when she moves certain ways and it bothered her last night when she  would lay certain ways in bed. The pt did take an ASA and NTG as a "precaution" this morning.   The pt said she got very anxious because of this pain and thought that her carotid artery had closed off. The pt denies any visual disturbances, HA, dizziness, slurred speech, weakness of extremities or other stroke symptoms.  EKG was performed and is unchanged from previous.  The pt did have a carotid performed 06/14/06 and this was normal.  I did review both the carotid and EKG with the pt.  I spoke with Dr Haroldine Laws (DOD) and we felt that symptoms could be related to muscle or nerve issues.  I instructed the pt to contact our office if she had any change in her symptoms or further questions.  Pt agrees with plan.  Theodosia Quay, RN, BSN  October 28, 2009 9:34 AM   Prescriptions: NITROSTAT 0.4 MG SUBL (NITROGLYCERIN) 1 tablet under tongue at onset of chest pain; you may repeat every 5 minutes for up to 3 doses.  #25 x 1   Entered by:   Theodosia Quay, RN, BSN   Authorized by:   Wadie Lessen, MD, [facility]   Signed by:   Theodosia Quay, RN, BSN on 10/28/2009   Method used:   Electronically to  [facility] (retail)       [address] [place], [place]  [id number]       Ph: [phone]       Fax: [phone]   RxID:   [id number]

## 2010-06-15 NOTE — Letter (Signed)
Summary: [facility] Surgical Clearance   [facility] Surgical Clearance   Imported By: Sallee Provencal 03/31/2010 14:09:54  _____________________________________________________________________  External Attachment:    Type:   Image     Comment:   External Document

## 2010-06-17 NOTE — Progress Notes (Signed)
Summary:  medication question  Phone Note Call from Patient Call back at Home Phone [phone]   Caller: Patient Reason for Call: Talk to Nurse, Talk to Doctor Summary of Call: pt is on furosemide and she needs to know if she needs to go back to a whole tab or just stay on a half a tab Initial call taken by: Shelda Pal,  June 03, 2010 12:20 PM  Follow-up for Phone Call        Left message for pt to call back. Theodosia Quay, RN, BSN  June 03, 2010 3:33 PM  Additional Follow-up for Phone Call Additional follow up Details #1::        PT RTN YOUR CALL Shelda Pal  June 03, 2010 4:06 PM   I spoke with the pt and asked her to remain on current dose of Furosemide 69m 1/2 daily.  I also made the pt aware of lab results. Additional Follow-up by: LTheodosia Quay RN, BSN,  June 03, 2010 4:59 PM

## 2010-06-17 NOTE — Letter (Signed)
Summary: Generic Letter  Press [facility], [facility]  [address] [place]   [place], [place] [address]   Phone: [phone]  Fax: [phone]        June 01, 2010 MRN: [id number]    [facility] [address] [place], [place]  [address]    To Whom it May Concern:  This patient can begin physical therapy from a cardiac standpoint.   Sincerely,    Dr Marcello Moores D. Melene Plan, RN, BSN

## 2010-06-17 NOTE — Miscellaneous (Signed)
Summary: update med  Clinical Lists Changes  Medications: Changed medication from METOPROLOL TARTRATE 50 MG TABS (METOPROLOL TARTRATE) 1 tablet in the morning and 1/2 tablet in the afternoon to METOPROLOL TARTRATE 50 MG TABS (METOPROLOL TARTRATE) Take 1 tablet by mouth two times a day

## 2010-06-17 NOTE — Assessment & Plan Note (Addendum)
Summary: Tonya Hunter    Visit Type:  Post-hospital Primary Provider:  [name]  CC:  weakness.  History of Present Illness: Tonya Hunter is in for a follow up visit.  Overall, she is doing pretty well.   She denies any significant chest pain.   She has only had one episode since I last saw her.  She has been getting home rehab, and has tolerated it reasonably well.  We reveiwed her current situation, and she is progressing nicely.  She saw Dr. Wynelle Link today, and he gave her a good report.  Her mobility is good.  Tonya Hunter had acute STEMI just after coming out of surgery.  See prior notes.  Had immediate transfer and early reperfusion treatment with relatively preserved LV.  Stent was occluded proximally, possible in an area of progressed disease at proximal end of stent, and a short overlapping stent was placed, with higher pressure post dilitation for maximal stent expansion, and prevention of stent malexpansion.    Problems Prior to Update: 1)  Neck Pain  (ICD-723.1) 2)  Coronary Artery Disease  (ICD-414.00) 3)  Diabetes Mellitus, Type II  (ICD-250.00) 4)  Hyperlipidemia  (ICD-272.4) 5)  Hypertension  (ICD-401.9) 6)  Osteoarthritis  (ICD-715.90) 7)  Hypothyroidism, Hx of  (ICD-V12.2) 8)  Family History Diabetes 1st Degree Relative  (ICD-V18.0)  Current Medications (verified): 1)  Metoprolol Tartrate 50 Mg Tabs (Metoprolol Tartrate) .... Take 1 Tablet By Mouth Two Times A Day 2)  Furosemide 40 Mg  Tabs (Furosemide) .... Take 1/2  Tablet By Mouth Once A Day 3)  Synthroid 200 Mcg  Tabs (Levothyroxine Sodium) .... Take 1 Tablet By Mouth Once A Day 4)  Vytorin 10-20 Mg Tabs (Ezetimibe-Simvastatin) .... Take One Tablet By Mouth Daily At Bedtime 5)  Plavix 75 Mg Tabs (Clopidogrel Bisulfate) .... Take One Tablet By Mouth Daily 6)  Metformin Hcl 500 Mg Tabs (Metformin Hcl) .... Take 4 Tablets At Night 7)  Lantus 100 Unit/ml Soln (Insulin Glargine) .... 50 Units At Night 8)  Aspirin Ec 325 Mg Tbec (Aspirin)  .... Take One Tablet By Mouth Daily 9)  Potassium Chloride Cr 10 Meq Cr-Caps (Potassium Chloride) .... Take 1 Tablet By Mouth Two Times A Day 10)  Nitrostat 0.4 Mg Subl (Nitroglycerin) .Marland Kitchen.. 1 Tablet Under Tongue At Onset of Chest Pain; You May Repeat Every 5 Minutes For Up To 3 Doses.  Allergies: 1)  ! Pcn 2)  ! Demerol  Vital Signs:  Patient profile:   76 year old female Height:      66 inches Weight:      216 pounds BMI:     34.99 Pulse rate:   76 / minute Pulse rhythm:   regular Resp:     18 per minute BP sitting:   144 / 78  (left arm)  Vitals Entered By: Sidney Ace (June 01, 2010 2:41 PM)  Physical Exam  General:  Well developed, well nourished, in no acute distress. Head:  normocephalic and atraumatic Eyes:  PERRLA/EOM intact; conjunctiva and lids normal. Lungs:  Clear bilaterally to auscultation and percussion. Heart:  PMI non displaced. Normal S1 and S2.  No significant murmur at this point in time.   Extremities:  Left knee slightly swollen, better than before, with nice healing at incision site.   Reasonable mobiility.  Otherwise normal  Neurologic:  Alert and oriented x 3.   EKG  Procedure date:  06/01/2010  Findings:      NSR.  RBBB.  Anterior T  wave inversion.V1-V3.  Possible anteroseptal infarct, age indeterminate.   Cardiac Cath  Procedure date:  05/04/2010  Findings:      ANGIOGRAPHIC DATA: 1. The left main is free of critical disease. 2. The left anterior descending artery courses to the apex.  The first     diagonal has about 70% area of segmental plaquing just after the     septal and is the previously placed stent, and it is occluded.     Following reperfusion, this area is opened.  A second stent is     placed proximally.  There is very minimal mal-expansion due to     calcified plaque at the proximal end of the stent.  There is an     excellent TIMI 3 flow down the remainder of the artery with good     distal runoff. 3. The circumflex  is a fairly large-caliber vessel with a fair amount     of mild luminal irregularity, calcified irregularities proximally.     There is a large marginal branch which bifurcates and has a     superior subbranch with about 80% narrowing, a finding which has     been previously noted.  There is a large posterolateral branch     which is free of critical disease. 4. The right coronary artery demonstrates about 30% narrowing after an     RV branch which has about 50% narrowing is probably 30% narrowing     in the mid PDA. 5. Ventriculography in the RAO projection reveals vigorous global     systolic function.  There maybe minimal anteroapical hypokinesis,     but overall ejection fraction appears to be in excess of 60%.   CONCLUSION: 1. Acute anterior wall infarction with peri-edge stent narrowing as     well as some element of stent thrombosis treated successfully with     the repeat balloon angioplasty and placement of the second     overlapping stent. 2. Recent knee surgery. 3. Poorly controlled diabetes mellitus.   DISPOSITION:  The patient will be managed in the coronary care unit. Aspirin and Plavix will be given.  She will likely be sent on low-dose Lovenox as opposed to Coumadin.         Loretha Brasil. Lia Foyer, MD, [facility]    Impression & Recommendations:  Problem # 1:  CORONARY ARTERY DISEASE (ICD-414.00) Post op STEMI with successful reperfusion.  She is suppressed on clopidogrel, and doing well.  Will continue at present.  I think she can proceed with ortho rehab, and she knows her limitations.   The following medications were removed from the medication list:    Isosorbide Mononitrate Cr 60 Mg Tb24 (Isosorbide mononitrate) .Marland Kitchen... Take 1 tablet by mouth once a day Her updated medication list for this problem includes:    Metoprolol Tartrate 50 Mg Tabs (Metoprolol tartrate) .Marland Kitchen... Take 1 tablet by mouth two times a day    Plavix 75 Mg Tabs (Clopidogrel bisulfate) .Marland Kitchen... Take one  tablet by mouth daily    Aspirin Ec 325 Mg Tbec (Aspirin) .Marland Kitchen... Take one tablet by mouth daily    Nitrostat 0.4 Mg Subl (Nitroglycerin) .Marland Kitchen... 1 tablet under tongue at onset of chest pain; you may repeat every 5 minutes for up to 3 doses.  Orders: EKG w/ Interpretation (93000) TLB-BMP (Basic Metabolic Panel-BMET) ([id number]) TLB-CBC Platelet - w/Differential (85025-CBCD)  Problem # 2:  DIABETES MELLITUS, TYPE II (ICD-250.00) Clearly needs to better in this arena.  She does  not control her diabetes well, and she knows this.  She understands importance, and we discussed at length the fact that diabetes is an inflammatory disease, with higher rates of ACS as a consequence.  She will continue to be more careful, and she has lost some weight in this regard.  Follow up with Dr. Joni Fears. ollowing medications were removed from the medication list:    Humalog 100 Unit/ml Soln (Insulin lispro (human)) .Marland KitchenMarland KitchenMarland KitchenMarland Kitchen 50 units every morning Her updated medication list for this problem includes:    Metformin Hcl 500 Mg Tabs (Metformin hcl) .Marland Kitchen... Take 4 tablets at night    Lantus 100 Unit/ml Soln (Insulin glargine) .Marland KitchenMarland KitchenMarland KitchenMarland Kitchen 50 units at night    Aspirin Ec 325 Mg Tbec (Aspirin) .Marland Kitchen... Take one tablet by mouth daily  Problem # 3:  HYPERLIPIDEMIA (ICD-272.4) remains on medical therapy.  Not alot of advancement of disease fortunately.  Her updated medication list for this problem includes:    Vytorin 10-20 Mg Tabs (Ezetimibe-simvastatin) .Marland Kitchen... Take one tablet by mouth daily at bedtime  Orders: TLB-BMP (Basic Metabolic Panel-BMET) (38937-[id number]) TLB-CBC Platelet - w/Differential (85025-CBCD)  Patient Instructions: 1)  Your physician recommends that you schedule a follow-up appointment in: 1 MONTH 2)  Your physician recommends that you continue on your current medications as directed. Please refer to the Current Medication list given to you today.  Appended Document: Tonya Hunter  reviewed visit however Dr. Sarajane Jews is no  longer a patient of mine

## 2010-06-17 NOTE — Consult Note (Signed)
Summary: [facility]  [place]   Imported By: Marilynne Drivers 05/20/2010 18:55:47  _____________________________________________________________________  External Attachment:    Type:   Image     Comment:   External Document

## 2010-06-17 NOTE — Progress Notes (Signed)
Summary: Records Request  Faxed EKG & Echo. to [facility] at [place] ([phone]). Tonya Hunter  April 26, 2010 8:53 AM

## 2010-07-05 ENCOUNTER — Ambulatory Visit (HOSPITAL_COMMUNITY): Payer: Self-pay

## 2010-07-05 ENCOUNTER — Other Ambulatory Visit: Payer: Self-pay

## 2010-07-05 ENCOUNTER — Encounter (HOSPITAL_COMMUNITY): Payer: Medicare Other | Attending: Cardiology

## 2010-07-05 DIAGNOSIS — Z9861 Coronary angioplasty status: Secondary | ICD-10-CM | POA: Insufficient documentation

## 2010-07-05 DIAGNOSIS — M1710 Unilateral primary osteoarthritis, unspecified knee: Secondary | ICD-10-CM | POA: Insufficient documentation

## 2010-07-05 DIAGNOSIS — H269 Unspecified cataract: Secondary | ICD-10-CM | POA: Insufficient documentation

## 2010-07-05 DIAGNOSIS — Z5189 Encounter for other specified aftercare: Secondary | ICD-10-CM | POA: Insufficient documentation

## 2010-07-05 DIAGNOSIS — I10 Essential (primary) hypertension: Secondary | ICD-10-CM | POA: Insufficient documentation

## 2010-07-05 DIAGNOSIS — E119 Type 2 diabetes mellitus without complications: Secondary | ICD-10-CM | POA: Insufficient documentation

## 2010-07-05 DIAGNOSIS — Z7902 Long term (current) use of antithrombotics/antiplatelets: Secondary | ICD-10-CM | POA: Insufficient documentation

## 2010-07-05 DIAGNOSIS — I2510 Atherosclerotic heart disease of native coronary artery without angina pectoris: Secondary | ICD-10-CM | POA: Insufficient documentation

## 2010-07-05 DIAGNOSIS — F411 Generalized anxiety disorder: Secondary | ICD-10-CM | POA: Insufficient documentation

## 2010-07-05 DIAGNOSIS — Z794 Long term (current) use of insulin: Secondary | ICD-10-CM | POA: Insufficient documentation

## 2010-07-05 DIAGNOSIS — E039 Hypothyroidism, unspecified: Secondary | ICD-10-CM | POA: Insufficient documentation

## 2010-07-05 DIAGNOSIS — IMO0002 Reserved for concepts with insufficient information to code with codable children: Secondary | ICD-10-CM | POA: Insufficient documentation

## 2010-07-05 DIAGNOSIS — Z96659 Presence of unspecified artificial knee joint: Secondary | ICD-10-CM | POA: Insufficient documentation

## 2010-07-05 DIAGNOSIS — E785 Hyperlipidemia, unspecified: Secondary | ICD-10-CM | POA: Insufficient documentation

## 2010-07-05 DIAGNOSIS — I252 Old myocardial infarction: Secondary | ICD-10-CM | POA: Insufficient documentation

## 2010-07-06 ENCOUNTER — Encounter: Payer: Self-pay | Admitting: Cardiology

## 2010-07-06 LAB — GLUCOSE, CAPILLARY: Glucose-Capillary: 199 mg/dL — ABNORMAL HIGH (ref 70–99)

## 2010-07-07 ENCOUNTER — Ambulatory Visit (HOSPITAL_COMMUNITY): Payer: Self-pay

## 2010-07-07 ENCOUNTER — Encounter (HOSPITAL_COMMUNITY): Payer: Medicare Other

## 2010-07-07 ENCOUNTER — Other Ambulatory Visit: Payer: Self-pay

## 2010-07-09 ENCOUNTER — Other Ambulatory Visit: Payer: Self-pay

## 2010-07-09 ENCOUNTER — Ambulatory Visit (HOSPITAL_COMMUNITY): Payer: Self-pay

## 2010-07-09 ENCOUNTER — Ambulatory Visit (HOSPITAL_COMMUNITY): Payer: Medicare Other

## 2010-07-09 ENCOUNTER — Encounter (HOSPITAL_COMMUNITY): Payer: Medicare Other

## 2010-07-12 ENCOUNTER — Other Ambulatory Visit: Payer: Self-pay

## 2010-07-12 ENCOUNTER — Encounter (HOSPITAL_COMMUNITY): Payer: Medicare Other

## 2010-07-12 ENCOUNTER — Ambulatory Visit (HOSPITAL_COMMUNITY): Payer: Self-pay

## 2010-07-12 LAB — GLUCOSE, CAPILLARY: Glucose-Capillary: 172 mg/dL — ABNORMAL HIGH (ref 70–99)

## 2010-07-13 ENCOUNTER — Encounter: Payer: Self-pay | Admitting: Cardiology

## 2010-07-13 ENCOUNTER — Ambulatory Visit (INDEPENDENT_AMBULATORY_CARE_PROVIDER_SITE_OTHER): Payer: Medicare Other | Admitting: Cardiology

## 2010-07-13 DIAGNOSIS — E7800 Pure hypercholesterolemia, unspecified: Secondary | ICD-10-CM

## 2010-07-13 DIAGNOSIS — I10 Essential (primary) hypertension: Secondary | ICD-10-CM

## 2010-07-13 DIAGNOSIS — I2510 Atherosclerotic heart disease of native coronary artery without angina pectoris: Secondary | ICD-10-CM

## 2010-07-13 LAB — GLUCOSE, CAPILLARY: Glucose-Capillary: 143 mg/dL — ABNORMAL HIGH (ref 70–99)

## 2010-07-14 ENCOUNTER — Other Ambulatory Visit: Payer: Self-pay

## 2010-07-14 ENCOUNTER — Encounter (HOSPITAL_COMMUNITY): Payer: Medicare Other

## 2010-07-14 ENCOUNTER — Ambulatory Visit (HOSPITAL_COMMUNITY): Payer: Self-pay

## 2010-07-15 LAB — GLUCOSE, CAPILLARY: Glucose-Capillary: 138 mg/dL — ABNORMAL HIGH (ref 70–99)

## 2010-07-16 ENCOUNTER — Other Ambulatory Visit: Payer: Self-pay

## 2010-07-16 ENCOUNTER — Ambulatory Visit (HOSPITAL_COMMUNITY): Payer: Self-pay

## 2010-07-16 ENCOUNTER — Encounter (HOSPITAL_COMMUNITY): Payer: Medicare Other | Attending: Cardiology

## 2010-07-16 DIAGNOSIS — F411 Generalized anxiety disorder: Secondary | ICD-10-CM | POA: Insufficient documentation

## 2010-07-16 DIAGNOSIS — I10 Essential (primary) hypertension: Secondary | ICD-10-CM | POA: Insufficient documentation

## 2010-07-16 DIAGNOSIS — IMO0002 Reserved for concepts with insufficient information to code with codable children: Secondary | ICD-10-CM | POA: Insufficient documentation

## 2010-07-16 DIAGNOSIS — Z96659 Presence of unspecified artificial knee joint: Secondary | ICD-10-CM | POA: Insufficient documentation

## 2010-07-16 DIAGNOSIS — Z5189 Encounter for other specified aftercare: Secondary | ICD-10-CM | POA: Insufficient documentation

## 2010-07-16 DIAGNOSIS — E039 Hypothyroidism, unspecified: Secondary | ICD-10-CM | POA: Insufficient documentation

## 2010-07-16 DIAGNOSIS — Z794 Long term (current) use of insulin: Secondary | ICD-10-CM | POA: Insufficient documentation

## 2010-07-16 DIAGNOSIS — I2510 Atherosclerotic heart disease of native coronary artery without angina pectoris: Secondary | ICD-10-CM | POA: Insufficient documentation

## 2010-07-16 DIAGNOSIS — I252 Old myocardial infarction: Secondary | ICD-10-CM | POA: Insufficient documentation

## 2010-07-16 DIAGNOSIS — Z7902 Long term (current) use of antithrombotics/antiplatelets: Secondary | ICD-10-CM | POA: Insufficient documentation

## 2010-07-16 DIAGNOSIS — E785 Hyperlipidemia, unspecified: Secondary | ICD-10-CM | POA: Insufficient documentation

## 2010-07-16 DIAGNOSIS — E119 Type 2 diabetes mellitus without complications: Secondary | ICD-10-CM | POA: Insufficient documentation

## 2010-07-16 DIAGNOSIS — Z9861 Coronary angioplasty status: Secondary | ICD-10-CM | POA: Insufficient documentation

## 2010-07-16 DIAGNOSIS — H269 Unspecified cataract: Secondary | ICD-10-CM | POA: Insufficient documentation

## 2010-07-16 DIAGNOSIS — M1710 Unilateral primary osteoarthritis, unspecified knee: Secondary | ICD-10-CM | POA: Insufficient documentation

## 2010-07-19 ENCOUNTER — Other Ambulatory Visit: Payer: Self-pay

## 2010-07-19 ENCOUNTER — Encounter (HOSPITAL_COMMUNITY): Payer: Medicare Other

## 2010-07-19 ENCOUNTER — Ambulatory Visit (HOSPITAL_COMMUNITY): Payer: Self-pay

## 2010-07-19 LAB — GLUCOSE, CAPILLARY: Glucose-Capillary: 163 mg/dL — ABNORMAL HIGH (ref 70–99)

## 2010-07-20 LAB — GLUCOSE, CAPILLARY: Glucose-Capillary: 195 mg/dL — ABNORMAL HIGH (ref 70–99)

## 2010-07-21 ENCOUNTER — Ambulatory Visit (HOSPITAL_COMMUNITY): Payer: Self-pay

## 2010-07-21 ENCOUNTER — Other Ambulatory Visit: Payer: Self-pay

## 2010-07-21 ENCOUNTER — Encounter (HOSPITAL_COMMUNITY): Payer: Medicare Other

## 2010-07-21 LAB — GLUCOSE, CAPILLARY: Glucose-Capillary: 172 mg/dL — ABNORMAL HIGH (ref 70–99)

## 2010-07-22 NOTE — Assessment & Plan Note (Signed)
Summary: 65mh follow up   Visit Type:  Follow-up Primary Provider:  AReynaldo Minium CC:  weakness.  History of Present Illness: Tonya Hunter is in for follow up.  Overall she is doing well.  She has started doing cardiac rehab.  She has noted a minimal amount of tightness in the chest with higher levels of activity.  Her knee remains a bit stiff at this point in time.  She has not had progressive symptoms, and she has done a better job with her diabetes.  Problems Prior to Update: 1)  Neck Pain  (ICD-723.1) 2)  Coronary Artery Disease  (ICD-414.00) 3)  Diabetes Mellitus, Type II  (ICD-250.00) 4)  Hyperlipidemia  (ICD-272.4) 5)  Hypertension  (ICD-401.9) 6)  Osteoarthritis  (ICD-715.90) 7)  Hypothyroidism, Hx of  (ICD-V12.2) 8)  Family History Diabetes 1st Degree Relative  (ICD-V18.0)  Current Medications (verified): 1)  Metoprolol Tartrate 50 Mg Tabs (Metoprolol Tartrate) .... Take 1 Tablet By Mouth Two Times A Day 2)  Furosemide 40 Mg  Tabs (Furosemide) .... Take 1/2  Tablet By Mouth Once A Day 3)  Synthroid 200 Mcg  Tabs (Levothyroxine Sodium) .... Take 1 Tablet By Mouth Once A Day 4)  Vytorin 10-20 Mg Tabs (Ezetimibe-Simvastatin) .... Take One Tablet By Mouth Daily At Bedtime 5)  Plavix 75 Mg Tabs (Clopidogrel Bisulfate) .... Take One Tablet By Mouth Daily 6)  Metformin Hcl 500 Mg Tabs (Metformin Hcl) .... Take 4 Tablets At Night 7)  Lantus 100 Unit/ml Soln (Insulin Glargine) .... 50 Units At Night 8)  Aspirin Ec 325 Mg Tbec (Aspirin) .... Take One Tablet By Mouth Daily 9)  Potassium Chloride Cr 10 Meq Cr-Caps (Potassium Chloride) .... Take 1 Tablet By Mouth Two Times A Day 10)  Nitrostat 0.4 Mg Subl (Nitroglycerin) ..Marland Kitchen. 1 Tablet Under Tongue At Onset of Chest Pain; You May Repeat Every 5 Minutes For Up To 3 Doses.  Allergies: 1)  ! Pcn 2)  ! Demerol  Vital Signs:  Patient profile:   76year old female Height:      66 inches Weight:      210 pounds BMI:     34.02 Pulse rate:   76 /  minute Pulse rhythm:   regular Resp:     18 per minute BP sitting:   120 / 64  (left arm) Cuff size:   large  Vitals Entered By: LSidney Ace(July 13, 2010 4:24 PM)  Physical Exam  General:  Well developed, well nourished, in no acute distress. Head:  normocephalic and atraumatic Eyes:  PERRLA/EOM intact; conjunctiva and lids normal. Lungs:  Clear bilaterally to auscultation and percussion. Heart:  PMI non displaced.  Normal S1 and S2.  No significant murmur.  No diastolic murmur.  Pulses:  pulses normal in all 4 extremities Extremities:  No clubbing or cyanosis. Neurologic:  Alert and oriented x 3.   EKG  Procedure date:  07/13/2010  Findings:      NSR. RBBB.  Anterior infarct, old.  T inversion lead V3.    Impression & Recommendations:  Problem # 1:  CORONARY ARTERY DISEASE (ICD-414.00) Overall appears stable.  Now being monitored in cardiac rehab, and watched  closely.  Will still see patient closely, and follow up closely as well.  She is to call for any change in symptom pattern.. Reminded to take her meds.  Her updated medication list for this problem includes:    Metoprolol Tartrate 50 Mg Tabs (Metoprolol tartrate) ..Marland Kitchen.. Take 1 tablet by  mouth two times a day    Plavix 75 Mg Tabs (Clopidogrel bisulfate) .Marland Kitchen... Take one tablet by mouth daily    Aspirin Ec 325 Mg Tbec (Aspirin) .Marland Kitchen... Take one tablet by mouth daily    Nitrostat 0.4 Mg Subl (Nitroglycerin) .Marland Kitchen... 1 tablet under tongue at onset of chest pain; you may repeat every 5 minutes for up to 3 doses.  Orders: EKG w/ Interpretation (93000)  Problem # 2:  HYPERLIPIDEMIA (ZSW-109.4) Being monitored by Dr. [name]. Her updated medication list for this problem includes:    Vytorin 10-20 Mg Tabs (Ezetimibe-simvastatin) .Marland Kitchen... Take one tablet by mouth daily at bedtime  Problem # 3:  HYPERTENSION (ICD-401.9) controlled at present.   Her updated medication list for this problem includes:    Metoprolol Tartrate 50  Mg Tabs (Metoprolol tartrate) .Marland Kitchen... Take 1 tablet by mouth two times a day    Furosemide 40 Mg Tabs (Furosemide) .Marland Kitchen... Take 1/2  tablet by mouth once a day    Aspirin Ec 325 Mg Tbec (Aspirin) .Marland Kitchen... Take one tablet by mouth daily  Problem # 4:  HYPOTHYROIDISM, HX OF (ICD-V12.2) on thyroid replacement.   Problem # 5:  DIABETES MELLITUS, TYPE II (ICD-250.00) A1C is improved per patient.   Her updated medication list for this problem includes:    Metformin Hcl 500 Mg Tabs (Metformin hcl) .Marland Kitchen... Take 4 tablets at night    Lantus 100 Unit/ml Soln (Insulin glargine) .Marland KitchenMarland KitchenMarland KitchenMarland Kitchen 50 units at night    Aspirin Ec 325 Mg Tbec (Aspirin) .Marland Kitchen... Take one tablet by mouth daily  Patient Instructions: 1)  Your physician recommends that you schedule a follow-up appointment in: 6 WEEKS 2)  Your physician recommends that you continue on your current medications as directed. Please refer to the Current Medication list given to you today. 3)  Dr Curt Jews [phone]

## 2010-07-23 ENCOUNTER — Ambulatory Visit (HOSPITAL_COMMUNITY): Payer: Self-pay

## 2010-07-23 ENCOUNTER — Other Ambulatory Visit: Payer: Self-pay

## 2010-07-23 ENCOUNTER — Encounter (HOSPITAL_COMMUNITY): Payer: Medicare Other

## 2010-07-26 ENCOUNTER — Other Ambulatory Visit: Payer: Self-pay

## 2010-07-26 ENCOUNTER — Ambulatory Visit (HOSPITAL_COMMUNITY): Payer: Self-pay

## 2010-07-26 ENCOUNTER — Encounter (HOSPITAL_COMMUNITY): Payer: Medicare Other

## 2010-07-26 LAB — PLATELET INHIBITION P2Y12
P2Y12 % Inhibition: 36 %
P2Y12 % Inhibition: 95 %
Platelet Function  P2Y12: 15 [PRU] — ABNORMAL LOW (ref 194–418)

## 2010-07-26 LAB — CBC
HCT: 25.3 % — ABNORMAL LOW (ref 36.0–46.0)
HCT: 27.2 % — ABNORMAL LOW (ref 36.0–46.0)
HCT: 28.3 % — ABNORMAL LOW (ref 36.0–46.0)
HCT: 28.9 % — ABNORMAL LOW (ref 36.0–46.0)
HCT: 30 % — ABNORMAL LOW (ref 36.0–46.0)
Hemoglobin: 8.9 g/dL — ABNORMAL LOW (ref 12.0–15.0)
Hemoglobin: 9.3 g/dL — ABNORMAL LOW (ref 12.0–15.0)
Hemoglobin: 9.4 g/dL — ABNORMAL LOW (ref 12.0–15.0)
Hemoglobin: 9.4 g/dL — ABNORMAL LOW (ref 12.0–15.0)
Hemoglobin: 9.6 g/dL — ABNORMAL LOW (ref 12.0–15.0)
MCH: 30.1 pg (ref 26.0–34.0)
MCH: 30.2 pg (ref 26.0–34.0)
MCH: 30.4 pg (ref 26.0–34.0)
MCH: 30.7 pg (ref 26.0–34.0)
MCHC: 32.5 g/dL (ref 30.0–36.0)
MCHC: 32.6 g/dL (ref 30.0–36.0)
MCHC: 32.6 g/dL (ref 30.0–36.0)
MCHC: 32.7 g/dL (ref 30.0–36.0)
MCHC: 33.2 g/dL (ref 30.0–36.0)
MCV: 91.6 fL (ref 78.0–100.0)
MCV: 92.2 fL (ref 78.0–100.0)
MCV: 92.3 fL (ref 78.0–100.0)
MCV: 93.2 fL (ref 78.0–100.0)
MCV: 94 fL (ref 78.0–100.0)
Platelets: 232 10*3/uL (ref 150–400)
Platelets: 253 10*3/uL (ref 150–400)
RBC: 2.8 MIL/uL — ABNORMAL LOW (ref 3.87–5.11)
RBC: 3.19 MIL/uL — ABNORMAL LOW (ref 3.87–5.11)
RDW: 13.1 % (ref 11.5–15.5)
RDW: 13.2 % (ref 11.5–15.5)
RDW: 13.4 % (ref 11.5–15.5)
WBC: 7.5 10*3/uL (ref 4.0–10.5)
WBC: 9.5 10*3/uL (ref 4.0–10.5)

## 2010-07-26 LAB — BASIC METABOLIC PANEL
BUN: 11 mg/dL (ref 6–23)
BUN: 11 mg/dL (ref 6–23)
BUN: 9 mg/dL (ref 6–23)
BUN: 9 mg/dL (ref 6–23)
CO2: 24 mEq/L (ref 19–32)
CO2: 25 mEq/L (ref 19–32)
CO2: 26 mEq/L (ref 19–32)
CO2: 27 mEq/L (ref 19–32)
Calcium: 7.8 mg/dL — ABNORMAL LOW (ref 8.4–10.5)
Calcium: 7.8 mg/dL — ABNORMAL LOW (ref 8.4–10.5)
Chloride: 101 mEq/L (ref 96–112)
Chloride: 102 mEq/L (ref 96–112)
Chloride: 104 mEq/L (ref 96–112)
Chloride: 99 mEq/L (ref 96–112)
Chloride: 99 mEq/L (ref 96–112)
Creatinine, Ser: 0.81 mg/dL (ref 0.4–1.2)
Creatinine, Ser: 0.82 mg/dL (ref 0.4–1.2)
Creatinine, Ser: 0.82 mg/dL (ref 0.4–1.2)
Creatinine, Ser: 0.86 mg/dL (ref 0.4–1.2)
GFR calc Af Amer: >60 mL/min (ref >60)
GFR calc Af Amer: >60 mL/min (ref >60)
GFR calc Af Amer: >60 mL/min (ref >60)
GFR calc non Af Amer: >60 mL/min (ref >60)
GFR calc non Af Amer: >60 mL/min (ref >60)
GFR calc non Af Amer: >60 mL/min (ref >60)
Glucose, Bld: 196 mg/dL — ABNORMAL HIGH (ref 70–99)
Glucose, Bld: 205 mg/dL — ABNORMAL HIGH (ref 70–99)
Glucose, Bld: 205 mg/dL — ABNORMAL HIGH (ref 70–99)
Glucose, Bld: 212 mg/dL — ABNORMAL HIGH (ref 70–99)
Glucose, Bld: 231 mg/dL — ABNORMAL HIGH (ref 70–99)
Glucose, Bld: 274 mg/dL — ABNORMAL HIGH (ref 70–99)
Potassium: 3.5 mEq/L (ref 3.5–5.1)
Potassium: 3.6 mEq/L (ref 3.5–5.1)
Potassium: 4.1 mEq/L (ref 3.5–5.1)
Sodium: 134 mEq/L — ABNORMAL LOW (ref 135–145)
Sodium: 136 mEq/L (ref 135–145)
Sodium: 136 mEq/L (ref 135–145)
Sodium: 136 mEq/L (ref 135–145)

## 2010-07-26 LAB — COMPREHENSIVE METABOLIC PANEL
ALT: 16 U/L (ref 0–35)
ALT: 36 U/L — ABNORMAL HIGH (ref 0–35)
AST: 232 U/L — ABNORMAL HIGH (ref 0–37)
Albumin: 2.8 g/dL — ABNORMAL LOW (ref 3.5–5.2)
CO2: 27 mEq/L (ref 19–32)
Calcium: 7.9 mg/dL — ABNORMAL LOW (ref 8.4–10.5)
Calcium: 8.4 mg/dL (ref 8.4–10.5)
Chloride: 102 mEq/L (ref 96–112)
Creatinine, Ser: 0.83 mg/dL (ref 0.4–1.2)
GFR calc Af Amer: >60 mL/min (ref >60)
GFR calc non Af Amer: >60 mL/min (ref >60)
Glucose, Bld: 114 mg/dL — ABNORMAL HIGH (ref 70–99)
Sodium: 136 mEq/L (ref 135–145)
Sodium: 136 mEq/L (ref 135–145)
Total Bilirubin: 0.8 mg/dL (ref 0.3–1.2)

## 2010-07-26 LAB — GLUCOSE, CAPILLARY
Glucose-Capillary: 138 mg/dL — ABNORMAL HIGH (ref 70–99)
Glucose-Capillary: 179 mg/dL — ABNORMAL HIGH (ref 70–99)
Glucose-Capillary: 122 mg/dL — ABNORMAL HIGH (ref 70–99)
Glucose-Capillary: 123 mg/dL — ABNORMAL HIGH (ref 70–99)
Glucose-Capillary: 129 mg/dL — ABNORMAL HIGH (ref 70–99)
Glucose-Capillary: 145 mg/dL — ABNORMAL HIGH (ref 70–99)
Glucose-Capillary: 149 mg/dL — ABNORMAL HIGH (ref 70–99)
Glucose-Capillary: 156 mg/dL — ABNORMAL HIGH (ref 70–99)
Glucose-Capillary: 160 mg/dL — ABNORMAL HIGH (ref 70–99)
Glucose-Capillary: 161 mg/dL — ABNORMAL HIGH (ref 70–99)
Glucose-Capillary: 163 mg/dL — ABNORMAL HIGH (ref 70–99)
Glucose-Capillary: 164 mg/dL — ABNORMAL HIGH (ref 70–99)
Glucose-Capillary: 166 mg/dL — ABNORMAL HIGH (ref 70–99)
Glucose-Capillary: 169 mg/dL — ABNORMAL HIGH (ref 70–99)
Glucose-Capillary: 169 mg/dL — ABNORMAL HIGH (ref 70–99)
Glucose-Capillary: 174 mg/dL — ABNORMAL HIGH (ref 70–99)
Glucose-Capillary: 178 mg/dL — ABNORMAL HIGH (ref 70–99)
Glucose-Capillary: 180 mg/dL — ABNORMAL HIGH (ref 70–99)
Glucose-Capillary: 182 mg/dL — ABNORMAL HIGH (ref 70–99)
Glucose-Capillary: 182 mg/dL — ABNORMAL HIGH (ref 70–99)
Glucose-Capillary: 185 mg/dL — ABNORMAL HIGH (ref 70–99)
Glucose-Capillary: 193 mg/dL — ABNORMAL HIGH (ref 70–99)
Glucose-Capillary: 198 mg/dL — ABNORMAL HIGH (ref 70–99)
Glucose-Capillary: 205 mg/dL — ABNORMAL HIGH (ref 70–99)
Glucose-Capillary: 214 mg/dL — ABNORMAL HIGH (ref 70–99)
Glucose-Capillary: 215 mg/dL — ABNORMAL HIGH (ref 70–99)
Glucose-Capillary: 224 mg/dL — ABNORMAL HIGH (ref 70–99)
Glucose-Capillary: 225 mg/dL — ABNORMAL HIGH (ref 70–99)
Glucose-Capillary: 227 mg/dL — ABNORMAL HIGH (ref 70–99)
Glucose-Capillary: 260 mg/dL — ABNORMAL HIGH (ref 70–99)
Glucose-Capillary: 270 mg/dL — ABNORMAL HIGH (ref 70–99)
Glucose-Capillary: 279 mg/dL — ABNORMAL HIGH (ref 70–99)
Glucose-Capillary: 289 mg/dL — ABNORMAL HIGH (ref 70–99)

## 2010-07-26 LAB — PROTIME-INR
INR: 0.94 (ref 0.00–1.49)
INR: 1.08 (ref 0.00–1.49)
INR: 1.19 (ref 0.00–1.49)
INR: 1.25 (ref 0.00–1.49)
Prothrombin Time: 14.2 seconds (ref 11.6–15.2)
Prothrombin Time: 15.9 seconds — ABNORMAL HIGH (ref 11.6–15.2)

## 2010-07-26 LAB — TYPE AND SCREEN
ABO/RH(D): O POS
Antibody Screen: NEGATIVE

## 2010-07-26 LAB — URINE MICROSCOPIC-ADD ON

## 2010-07-26 LAB — CK TOTAL AND CKMB (NOT AT ARMC)
CK, MB: 199.5 ng/mL (ref 0.3–4.0)
Relative Index: 6.2 — ABNORMAL HIGH (ref 0.0–2.5)
Relative Index: 8.1 — ABNORMAL HIGH (ref 0.0–2.5)
Relative Index: 8.2 — ABNORMAL HIGH (ref 0.0–2.5)

## 2010-07-26 LAB — DIFFERENTIAL
Basophils Absolute: 0 10*3/uL (ref 0.0–0.1)
Basophils Relative: 0 % (ref 0–1)
Eosinophils Absolute: 0.3 10*3/uL (ref 0.0–0.7)
Lymphs Abs: 3.1 10*3/uL (ref 0.7–4.0)
Neutrophils Relative %: 53 % (ref 43–77)

## 2010-07-26 LAB — URINE CULTURE

## 2010-07-26 LAB — URINALYSIS, ROUTINE W REFLEX MICROSCOPIC
Glucose, UA: 100 mg/dL — AB
Hgb urine dipstick: NEGATIVE
Specific Gravity, Urine: >1.046 — ABNORMAL HIGH (ref 1.005–1.030)
Urobilinogen, UA: 1 mg/dL (ref 0.0–1.0)
pH: 5.5 (ref 5.0–8.0)

## 2010-07-26 LAB — MRSA PCR SCREENING: MRSA by PCR: NEGATIVE

## 2010-07-26 LAB — TROPONIN I: Troponin I: >100 ng/mL (ref 0.00–0.06)

## 2010-07-26 LAB — CARDIAC PANEL(CRET KIN+CKTOT+MB+TROPI)
Total CK: 244 U/L — ABNORMAL HIGH (ref 7–177)
Troponin I: 9.77 ng/mL (ref 0.00–0.06)

## 2010-07-27 LAB — CBC
HCT: 40.2 % (ref 36.0–46.0)
MCH: 30.5 pg (ref 26.0–34.0)
MCHC: 32.8 g/dL (ref 30.0–36.0)
RDW: 13 % (ref 11.5–15.5)

## 2010-07-27 LAB — COMPREHENSIVE METABOLIC PANEL
ALT: 24 U/L (ref 0–35)
CO2: 24 mEq/L (ref 19–32)
Calcium: 9.8 mg/dL (ref 8.4–10.5)
Creatinine, Ser: 0.83 mg/dL (ref 0.4–1.2)
GFR calc Af Amer: >60 mL/min (ref >60)
GFR calc non Af Amer: >60 mL/min (ref >60)
Glucose, Bld: 203 mg/dL — ABNORMAL HIGH (ref 70–99)
Sodium: 140 mEq/L (ref 135–145)
Total Protein: 7.4 g/dL (ref 6.0–8.3)

## 2010-07-27 LAB — SURGICAL PCR SCREEN
MRSA, PCR: NEGATIVE
Staphylococcus aureus: POSITIVE — AB

## 2010-07-27 LAB — URINALYSIS, ROUTINE W REFLEX MICROSCOPIC
Glucose, UA: NEGATIVE mg/dL
Ketones, ur: NEGATIVE mg/dL

## 2010-07-27 LAB — PROTIME-INR
INR: 1.08 (ref 0.00–1.49)
Prothrombin Time: 14.2 seconds (ref 11.6–15.2)

## 2010-07-27 LAB — URINE MICROSCOPIC-ADD ON

## 2010-07-27 LAB — APTT: aPTT: 43 seconds — ABNORMAL HIGH (ref 24–37)

## 2010-07-28 ENCOUNTER — Encounter (HOSPITAL_COMMUNITY): Payer: Medicare Other

## 2010-07-28 ENCOUNTER — Ambulatory Visit (HOSPITAL_COMMUNITY): Payer: Self-pay

## 2010-07-30 ENCOUNTER — Encounter (HOSPITAL_COMMUNITY): Payer: Medicare Other

## 2010-07-30 ENCOUNTER — Ambulatory Visit (HOSPITAL_COMMUNITY): Payer: Self-pay

## 2010-08-02 ENCOUNTER — Ambulatory Visit (HOSPITAL_COMMUNITY): Payer: Self-pay

## 2010-08-02 ENCOUNTER — Encounter (HOSPITAL_COMMUNITY): Payer: Medicare Other

## 2010-08-04 ENCOUNTER — Ambulatory Visit (HOSPITAL_COMMUNITY): Payer: Self-pay

## 2010-08-04 ENCOUNTER — Encounter (HOSPITAL_COMMUNITY): Payer: Medicare Other

## 2010-08-06 ENCOUNTER — Ambulatory Visit (HOSPITAL_COMMUNITY): Payer: Self-pay

## 2010-08-06 ENCOUNTER — Other Ambulatory Visit: Payer: Self-pay | Admitting: Cardiology

## 2010-08-06 ENCOUNTER — Encounter (HOSPITAL_COMMUNITY): Payer: Medicare Other

## 2010-08-09 ENCOUNTER — Ambulatory Visit (HOSPITAL_COMMUNITY): Payer: Self-pay

## 2010-08-09 ENCOUNTER — Other Ambulatory Visit: Payer: Self-pay | Admitting: Cardiology

## 2010-08-09 ENCOUNTER — Encounter (HOSPITAL_COMMUNITY): Payer: Medicare Other

## 2010-08-10 ENCOUNTER — Encounter: Payer: Self-pay | Admitting: Cardiology

## 2010-08-11 ENCOUNTER — Ambulatory Visit (HOSPITAL_COMMUNITY): Payer: Self-pay

## 2010-08-11 ENCOUNTER — Encounter (HOSPITAL_COMMUNITY): Payer: Medicare Other

## 2010-08-12 NOTE — Miscellaneous (Signed)
Summary: [facility] Last Lab Request   [facility] Last Lab Request   Imported By: Sallee Provencal 07/23/2010 11:10:34  _____________________________________________________________________  External Attachment:    Type:   Image     Comment:   External Document

## 2010-08-13 ENCOUNTER — Encounter (HOSPITAL_COMMUNITY): Payer: Medicare Other

## 2010-08-13 ENCOUNTER — Ambulatory Visit (HOSPITAL_COMMUNITY): Payer: Self-pay

## 2010-08-16 ENCOUNTER — Ambulatory Visit (HOSPITAL_COMMUNITY): Payer: Self-pay

## 2010-08-16 ENCOUNTER — Encounter (HOSPITAL_COMMUNITY): Payer: Medicare Other | Attending: Cardiology

## 2010-08-16 DIAGNOSIS — Z7902 Long term (current) use of antithrombotics/antiplatelets: Secondary | ICD-10-CM | POA: Insufficient documentation

## 2010-08-16 DIAGNOSIS — Z5189 Encounter for other specified aftercare: Secondary | ICD-10-CM | POA: Insufficient documentation

## 2010-08-16 DIAGNOSIS — I10 Essential (primary) hypertension: Secondary | ICD-10-CM | POA: Insufficient documentation

## 2010-08-16 DIAGNOSIS — H269 Unspecified cataract: Secondary | ICD-10-CM | POA: Insufficient documentation

## 2010-08-16 DIAGNOSIS — Z96659 Presence of unspecified artificial knee joint: Secondary | ICD-10-CM | POA: Insufficient documentation

## 2010-08-16 DIAGNOSIS — F411 Generalized anxiety disorder: Secondary | ICD-10-CM | POA: Insufficient documentation

## 2010-08-16 DIAGNOSIS — Z9861 Coronary angioplasty status: Secondary | ICD-10-CM | POA: Insufficient documentation

## 2010-08-16 DIAGNOSIS — I2510 Atherosclerotic heart disease of native coronary artery without angina pectoris: Secondary | ICD-10-CM | POA: Insufficient documentation

## 2010-08-16 DIAGNOSIS — E039 Hypothyroidism, unspecified: Secondary | ICD-10-CM | POA: Insufficient documentation

## 2010-08-16 DIAGNOSIS — Z794 Long term (current) use of insulin: Secondary | ICD-10-CM | POA: Insufficient documentation

## 2010-08-16 DIAGNOSIS — M1710 Unilateral primary osteoarthritis, unspecified knee: Secondary | ICD-10-CM | POA: Insufficient documentation

## 2010-08-16 DIAGNOSIS — IMO0002 Reserved for concepts with insufficient information to code with codable children: Secondary | ICD-10-CM | POA: Insufficient documentation

## 2010-08-16 DIAGNOSIS — E119 Type 2 diabetes mellitus without complications: Secondary | ICD-10-CM | POA: Insufficient documentation

## 2010-08-16 DIAGNOSIS — I252 Old myocardial infarction: Secondary | ICD-10-CM | POA: Insufficient documentation

## 2010-08-16 DIAGNOSIS — E785 Hyperlipidemia, unspecified: Secondary | ICD-10-CM | POA: Insufficient documentation

## 2010-08-18 ENCOUNTER — Ambulatory Visit (HOSPITAL_COMMUNITY): Payer: Self-pay

## 2010-08-18 ENCOUNTER — Encounter (HOSPITAL_COMMUNITY): Payer: Medicare Other

## 2010-08-20 ENCOUNTER — Encounter (HOSPITAL_COMMUNITY): Payer: Medicare Other

## 2010-08-20 ENCOUNTER — Ambulatory Visit (HOSPITAL_COMMUNITY): Payer: Self-pay

## 2010-08-23 ENCOUNTER — Ambulatory Visit (HOSPITAL_COMMUNITY): Payer: Self-pay

## 2010-08-23 ENCOUNTER — Encounter (HOSPITAL_COMMUNITY): Payer: Medicare Other

## 2010-08-23 ENCOUNTER — Other Ambulatory Visit: Payer: Self-pay | Admitting: Cardiology

## 2010-08-24 ENCOUNTER — Encounter: Payer: Self-pay | Admitting: Cardiology

## 2010-08-24 ENCOUNTER — Ambulatory Visit (INDEPENDENT_AMBULATORY_CARE_PROVIDER_SITE_OTHER): Payer: Medicare Other | Admitting: Cardiology

## 2010-08-24 DIAGNOSIS — I2510 Atherosclerotic heart disease of native coronary artery without angina pectoris: Secondary | ICD-10-CM

## 2010-08-24 DIAGNOSIS — E785 Hyperlipidemia, unspecified: Secondary | ICD-10-CM

## 2010-08-24 DIAGNOSIS — I10 Essential (primary) hypertension: Secondary | ICD-10-CM

## 2010-08-24 MED ORDER — POTASSIUM CHLORIDE 10 MEQ PO TBCR: 10.0000 meq | EXTENDED_RELEASE_TABLET | Freq: Two times a day (BID) | ORAL | Status: DC

## 2010-08-24 NOTE — Assessment & Plan Note (Signed)
No recurrent symptoms.  I think she can reduce her ASA to 15m.  We will continue to follow her in six months, or earlier should problems arise.

## 2010-08-24 NOTE — Assessment & Plan Note (Signed)
Followed by Dr. [name].  Labs not available to [name].

## 2010-08-24 NOTE — Patient Instructions (Signed)
Your physician wants you to follow-up in: 6 MONTHS.  You will receive a reminder letter in the mail two months in advance. If you don't receive a letter, please call our office to schedule the follow-up appointment.  Your physician has recommended you make the following change in your medication: DECREASE Aspirin to 87m once a day

## 2010-08-24 NOTE — Progress Notes (Signed)
HPI:  Tonya Hunter is in for follow up.  She is in rehab and doing well.  Does not have chest pain.  Overall is doing well.  Her sugars do go up and down.  Her LV function at the time of her MI was essentially normal.    Current Outpatient Prescriptions  Medication Sig Dispense Refill  . aspirin 325 MG EC tablet Take 325 mg by mouth daily.        . clopidogrel (PLAVIX) 75 MG tablet Take 75 mg by mouth daily.        Marland Kitchen ezetimibe-simvastatin (VYTORIN) 10-20 MG per tablet Take 1 tablet by mouth at bedtime.        . furosemide (LASIX) 40 MG tablet Take 40 mg by mouth daily. TAKE 1/2 (HALF) TAB QD       . insulin glargine (LANTUS) 100 UNIT/ML injection Inject 50 Units into the skin at bedtime.        Marland Kitchen levothyroxine (SYNTHROID, LEVOTHROID) 200 MCG tablet Take 200 mcg by mouth daily.        . metFORMIN (GLUCOPHAGE) 500 MG tablet Take 500 mg by mouth at bedtime. TAKE 4 TABS QHS       . nitroGLYCERIN (NITROSTAT) 0.4 MG SL tablet Place 0.4 mg under the tongue every 5 (five) minutes as needed.        . potassium chloride (KLOR-CON) 10 MEQ CR tablet Take 10 mEq by mouth 2 (two) times daily.          Allergies  Allergen Reactions  . Meperidine Hcl   . Penicillins     Past Medical History  Diagnosis Date  . Coronary artery disease 09/2005    s/p TAXUS DRUG-ELUTING STENT PLACEMENT TO THE LEFT ANTERIOR DESCENDING ARTERY  . Hypertension   . Diabetes mellitus     TYPE II  . Hyperlipidemia   . Osteoarthrosis, unspecified whether generalized or localized, unspecified site   . Thyroid disease     HYPOTHYROIDISM  . History of hysterectomy     Past Surgical History  Procedure Date  . Median nerve repair 2009    DECOMPRESSION...RIGHT AND LEFT DECOMPRESSION  . Appendectomy   . Cataract extraction   . Cholecystectomy   . Hernia repair   . Knee surgery   . Coronary stent placement 09/2005    PLACEMENT TO LEFT ANTERIOR DESCENDING ARTERY    Family History  Problem Relation Age of Onset  . Diabetes    .  Hypertension      History   Social History  . Marital Status: Married    Spouse Name: N/A    Number of Children: N/A  . 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 Smoking status: Never Smoker   . Smokeless tobacco: Not on file  . Alcohol Use: No  . Drug Use: No  . Sexually Active:    Other Topics Concern  . Not on file   Social History Narrative  . No narrative on file    ROS: Please see the HPI.  All other systems reviewed and negative.  PHYSICAL EXAM:  BP 124/74  Pulse 62  Resp 18  Ht 5' 6"  (1.676 m)  Wt 204 lb (92.534 kg)  BMI 32.93 kg/m2  General: Well developed, well nourished, in no acute distress. Head:  Normocephalic and atraumatic. Neck: no JVD Lungs: Clear to auscultation and percussion. Heart: Normal S1 and S2.  No murmur, rubs  or gallops.  Pulses: Pulses normal in all 4 extremities. Extremities: No clubbing or cyanosis. No edema. Neurologic: Alert and oriented x 3.  EKG:  NSR.  RBBB.  ASSESSMENT AND PLAN:

## 2010-08-24 NOTE — Assessment & Plan Note (Signed)
Well controlled 

## 2010-08-25 ENCOUNTER — Encounter (HOSPITAL_COMMUNITY): Payer: Medicare Other

## 2010-08-25 ENCOUNTER — Other Ambulatory Visit: Payer: Self-pay | Admitting: Cardiology

## 2010-08-25 ENCOUNTER — Ambulatory Visit (HOSPITAL_COMMUNITY): Payer: Self-pay

## 2010-08-25 LAB — GLUCOSE, CAPILLARY: Glucose-Capillary: 298 mg/dL — ABNORMAL HIGH (ref 70–99)

## 2010-08-26 ENCOUNTER — Telehealth: Payer: Self-pay | Admitting: Cardiology

## 2010-08-27 ENCOUNTER — Encounter (HOSPITAL_COMMUNITY): Payer: Medicare Other

## 2010-08-27 ENCOUNTER — Other Ambulatory Visit: Payer: Self-pay | Admitting: Cardiology

## 2010-08-27 ENCOUNTER — Ambulatory Visit (HOSPITAL_COMMUNITY): Payer: Self-pay

## 2010-08-27 NOTE — Telephone Encounter (Signed)
I spoke with the pt and made her aware that when she was in the office she requested that her potassium prescription be sent to [place].  This was sent on 08/24/10.  The pt has now ran out of potassium and I made her aware that I could call in a local Rx for this medication.  The pt now does not want a local prescription because the medicine will be coming from [place].  I made the pt aware that if she is not taking a potassium supplement for a few days then she needs to increase her intake of potassium rich foods.  Pt agreed with plan and will call the office back if she decides to get a local RX.

## 2010-08-30 ENCOUNTER — Encounter (HOSPITAL_COMMUNITY): Payer: Medicare Other

## 2010-08-30 ENCOUNTER — Ambulatory Visit (HOSPITAL_COMMUNITY): Payer: Self-pay

## 2010-08-30 LAB — GLUCOSE, CAPILLARY: Glucose-Capillary: 217 mg/dL — ABNORMAL HIGH (ref 70–99)

## 2010-09-01 ENCOUNTER — Ambulatory Visit (HOSPITAL_COMMUNITY): Payer: Self-pay

## 2010-09-01 ENCOUNTER — Encounter (HOSPITAL_COMMUNITY): Payer: Medicare Other

## 2010-09-02 ENCOUNTER — Other Ambulatory Visit: Payer: Self-pay | Admitting: Cardiology

## 2010-09-03 ENCOUNTER — Ambulatory Visit (HOSPITAL_COMMUNITY): Payer: Self-pay

## 2010-09-03 ENCOUNTER — Encounter (HOSPITAL_COMMUNITY): Payer: Medicare Other

## 2010-09-06 ENCOUNTER — Ambulatory Visit (HOSPITAL_COMMUNITY): Payer: Self-pay

## 2010-09-06 ENCOUNTER — Encounter (HOSPITAL_COMMUNITY): Payer: Medicare Other

## 2010-09-08 ENCOUNTER — Encounter (HOSPITAL_COMMUNITY): Payer: Medicare Other

## 2010-09-08 ENCOUNTER — Ambulatory Visit (HOSPITAL_COMMUNITY): Payer: Self-pay

## 2010-09-10 ENCOUNTER — Ambulatory Visit (HOSPITAL_COMMUNITY): Payer: Self-pay

## 2010-09-10 ENCOUNTER — Encounter (HOSPITAL_COMMUNITY): Payer: Medicare Other

## 2010-09-13 ENCOUNTER — Ambulatory Visit (HOSPITAL_COMMUNITY): Payer: Self-pay

## 2010-09-13 ENCOUNTER — Encounter (HOSPITAL_COMMUNITY): Payer: Medicare Other

## 2010-09-15 ENCOUNTER — Encounter (HOSPITAL_COMMUNITY): Payer: Medicare Other

## 2010-09-15 ENCOUNTER — Ambulatory Visit (HOSPITAL_COMMUNITY): Payer: Self-pay

## 2010-09-17 ENCOUNTER — Ambulatory Visit (HOSPITAL_COMMUNITY): Payer: Self-pay

## 2010-09-17 ENCOUNTER — Encounter (HOSPITAL_COMMUNITY): Payer: Medicare Other

## 2010-09-20 ENCOUNTER — Encounter (HOSPITAL_COMMUNITY): Payer: Medicare Other | Attending: Cardiology

## 2010-09-20 ENCOUNTER — Ambulatory Visit (HOSPITAL_COMMUNITY): Payer: Self-pay

## 2010-09-20 DIAGNOSIS — I2510 Atherosclerotic heart disease of native coronary artery without angina pectoris: Secondary | ICD-10-CM | POA: Insufficient documentation

## 2010-09-20 DIAGNOSIS — I10 Essential (primary) hypertension: Secondary | ICD-10-CM | POA: Insufficient documentation

## 2010-09-20 DIAGNOSIS — E119 Type 2 diabetes mellitus without complications: Secondary | ICD-10-CM | POA: Insufficient documentation

## 2010-09-20 DIAGNOSIS — Z7902 Long term (current) use of antithrombotics/antiplatelets: Secondary | ICD-10-CM | POA: Insufficient documentation

## 2010-09-20 DIAGNOSIS — E039 Hypothyroidism, unspecified: Secondary | ICD-10-CM | POA: Insufficient documentation

## 2010-09-20 DIAGNOSIS — Z9861 Coronary angioplasty status: Secondary | ICD-10-CM | POA: Insufficient documentation

## 2010-09-20 DIAGNOSIS — IMO0002 Reserved for concepts with insufficient information to code with codable children: Secondary | ICD-10-CM | POA: Insufficient documentation

## 2010-09-20 DIAGNOSIS — M1710 Unilateral primary osteoarthritis, unspecified knee: Secondary | ICD-10-CM | POA: Insufficient documentation

## 2010-09-20 DIAGNOSIS — F411 Generalized anxiety disorder: Secondary | ICD-10-CM | POA: Insufficient documentation

## 2010-09-20 DIAGNOSIS — I252 Old myocardial infarction: Secondary | ICD-10-CM | POA: Insufficient documentation

## 2010-09-20 DIAGNOSIS — Z794 Long term (current) use of insulin: Secondary | ICD-10-CM | POA: Insufficient documentation

## 2010-09-20 DIAGNOSIS — Z5189 Encounter for other specified aftercare: Secondary | ICD-10-CM | POA: Insufficient documentation

## 2010-09-20 DIAGNOSIS — Z96659 Presence of unspecified artificial knee joint: Secondary | ICD-10-CM | POA: Insufficient documentation

## 2010-09-20 DIAGNOSIS — H269 Unspecified cataract: Secondary | ICD-10-CM | POA: Insufficient documentation

## 2010-09-20 DIAGNOSIS — E785 Hyperlipidemia, unspecified: Secondary | ICD-10-CM | POA: Insufficient documentation

## 2010-09-22 ENCOUNTER — Ambulatory Visit (HOSPITAL_COMMUNITY): Payer: Self-pay

## 2010-09-22 ENCOUNTER — Encounter (INDEPENDENT_AMBULATORY_CARE_PROVIDER_SITE_OTHER): Payer: Medicare Other

## 2010-09-22 ENCOUNTER — Encounter (INDEPENDENT_AMBULATORY_CARE_PROVIDER_SITE_OTHER): Payer: Medicare Other | Admitting: Vascular Surgery

## 2010-09-22 ENCOUNTER — Encounter: Payer: Self-pay | Admitting: Vascular Surgery

## 2010-09-22 ENCOUNTER — Encounter (HOSPITAL_COMMUNITY): Payer: Medicare Other

## 2010-09-22 DIAGNOSIS — I83893 Varicose veins of bilateral lower extremities with other complications: Secondary | ICD-10-CM

## 2010-09-23 NOTE — Consult Note (Signed)
NEW PATIENT CONSULTATION  Tonya Hunter, Tonya Hunter DOB:  10-28-1934                                       09/22/2010 TAVWP#:[id number]  Patient presents today for evaluation of painful venous varicosity in her left pretibial area.  She reports this is becoming more enlarged over time, and she does have discomfort over this area, described as aching, more so with prolonged standing.  This can occur over the varicosity and also extends down onto her ankle as well.  She does not have any history of DVT, superficial thrombophlebitis, or bleeding.  She does not have any similar difficulty in her right leg.  PAST HISTORY:  Significant for insulin-dependent diabetes, hypertension. Does have a history of myocardial infarction at the time of orthopedic surgery on May 03, 2010.  Prior history is notable for cholecystectomy, hernia, hysterectomy, cataracts, bilateral carpal tunnel syndrome.  SOCIAL HISTORY:  She is married with 2 children.  She is retired from [employer].  She does not smoke or drink alcohol.  FAMILY HISTORY:  Significant for myocardial infarction and death of her father at age 65.  REVIEW OF SYSTEMS:  Positive for bronchitis, otherwise review of systems negative except for HPI.  PHYSICAL EXAMINATION:  A well-developed and well-nourished white female appearing stated age in no acute stress.  Blood pressure is 151/61, heart rate 83, respirations 18.  HEENT:  Normal.  Radial pulses are 2+. She has 2+ dorsalis pedis pulses bilaterally.  She does have a prior incision in her left patella  for total knee replacement, otherwise she has no major deformities or cyanosis.  Neurologic:  No focal weakness, paresthesias.  Skin without ulcers or rashes.  She does have the varix as described over her left pretibial area.  She underwent a formal venous duplex in our office, and this reveals multiple Tonya Hunter branching of her left great saphenous vein.  She does not have  any evidence of reflux in her great saphenous vein.  The area of particular concern does arise from her branch of her great saphenous vein and does communicate with a perforator at the level of the lateral calf.  She has not worn compression garments for these, and today was fitted with thigh-high graduated compression garments.  I do feel she could get the symptom relief from phlebectomy of this vein.  I explained that this is not dangerous and would not put her at any increased risk for DVT or other more serious medical problems.  I do not feel that she would require any treatment of her saphenous veins since we could not demonstrate any reflux on duplex.  She understands and will see [name] again in 3 months for continued discussion to determine if she is achieving adequate symptom relief with compression alone.    Rosetta Posner, M.D. Electronically Signed  TFE/MEDQ  D:  09/22/2010  T:  09/23/2010  Job:  [id number]  cc:   [name], M.D. Loretha Brasil. Lia Foyer, MD, [facility]

## 2010-09-24 ENCOUNTER — Ambulatory Visit (HOSPITAL_COMMUNITY): Payer: Self-pay

## 2010-09-24 ENCOUNTER — Encounter (HOSPITAL_COMMUNITY): Payer: Medicare Other

## 2010-09-27 ENCOUNTER — Encounter (HOSPITAL_COMMUNITY): Payer: Medicare Other

## 2010-09-27 ENCOUNTER — Ambulatory Visit (HOSPITAL_COMMUNITY): Payer: Self-pay

## 2010-09-28 NOTE — Assessment & Plan Note (Signed)
[facility]                            CARDIOLOGY OFFICE NOTE   NAME:Tonya Hunter, Tonya Hunter                       MRN:          [id number]  DATE:07/30/2007                            DOB:          30-Apr-1935    HISTORY OF PRESENT ILLNESS:  Tonya Hunter is in for follow-up.  In general,  she has been stable.  She did fall on the way in today.  In careful  questioning her, she did not have syncope or presyncope.  She also did  not trip or slide on any object.  She says she just thinks she just  simply fell.  In order to protect her leg, she landed on both hands.  However, she has full mobility, and the nurses placed some ice and gave  her some oral Tylenol.  Her sugars have been a little bit higher at  night, and I have encouraged her to speak with Dr. [name].  She has not  been having any recurrent anginal-type symptoms, and overall seems to be  getting along reasonably well.   PHYSICAL EXAMINATION:  VITAL SIGNS:  Today on examination a blood  pressure is 126/76, pulse 66.  LUNG:  Fields are clear.  CARDIAC:  Rhythm is regular.  NEUROLOGICAL:  She is able to squeeze both hands.  There is no  inordinate amount of swelling.  There is probably a bruise on the left  palm.  The remainder of the examination is unremarkable.   STUDIES:  EKG today reveals normal sinus rhythm with right bundle branch  block and is unchanged from previous tracings.   IMPRESSION:  1. Coronary disease with known multivessel.  2. Hypertension, under good control.  3. Hypercholesterolemia.  4. Insulin dependent diabetes mellitus.   PLAN:  1. Basic metabolic profile will be obtained.  2. We will defer to Dr. [name] for both management of lipids and her      diabetes.  3. We will have her return to see [name] in follow-up in 6 months.  4. She will remain on the same current medical regimen.     Loretha Brasil. Lia Foyer, MD, [facility]  Electronically Signed    TDS/MedQ  DD: 07/30/2007  DT:  07/30/2007  Job #: [id number]

## 2010-09-28 NOTE — H&P (Signed)
Tonya Hunter, PETH NO.:  [id number]   MEDICAL RECORD NO.:  [id number]          PATIENT TYPE:  INP   LOCATION:  7628                         FACILITY:  [facility]   PHYSICIAN:  Gaynelle Arabian, M.D.    DATE OF BIRTH:  June 12, 1934   DATE OF ADMISSION:  01/01/2007  DATE OF DISCHARGE:  01/06/2007                              HISTORY & PHYSICAL   CHIEF COMPLAINT:  Right knee pain.   HISTORY OF PRESENT ILLNESS:  The patient is 76 year old female who has  seen by Dr. Wynelle Link for ongoing knee pain.  She has known significant  end-stage arthritis which had been refractory to nonoperative  management.  She now presents for total knee replacement.  She has been  seen preoperatively by Dr. Lia Foyer and also by Dr. [name] and felt to  be stable for up and coming surgery.   ALLERGIES:  PENICILLIN.   CURRENT MEDICATIONS:  AVANDARYL, ASPIRIN, METFORMIN, FUROSEMIDE, PLAVIX,  POTASSIUM, METOPROLOL, ISOSORBIDE, SYNTHROID, VYTORIN AND LANTUS  INSULIN.   PAST MEDICAL HISTORY:  1. History bronchitis.  2. History of pneumonia.  3. Hypertension.  4. Coronary arterial disease.  5 . Hypercholesterolemia.  1. Diverticulosis.  2. Insulin dependent diabetes mellitus.  3. Hypothyroidism.   PAST SURGICAL HISTORY:  1. Hysterectomy.  2. Gallbladder surgery.  3. Hernia repair.  4. Shoulder surgery.  5. Cardiac catheterization with stenting.   SOCIAL HISTORY:  Married.  Works as an Web designer.  Nonsmoker.  No alcohol.  Two children.   FAMILY HISTORY:  Father with stroke and hypertension.  Mother with  cancer.  Grandfather with diabetes.   REVIEW OF SYSTEMS:  GENERAL:  No fevers, chills, night sweats.  NEUROLOGICAL:  No seizures, syncope or paralysis.  RESPIRATORY:  No  shortness of breath, productive cough or hemoptysis.  CARDIOVASCULAR:  No chest pain or orthopnea.  GI: No nausea or constipation.  GU: No  dysuria or hematuria.  DISCHARGE MUSCULOSKELETAL:  Right  knee.   PHYSICAL EXAMINATION:  VITAL SIGNS:  Pulse 76, respirations 14, blood  pressure 120/68.  GENERAL:  A 77 year old white female, well-nourished, well-developed, no  acute distress, overweight, alert, oriented, cooperative, pleasant.  HEENT:  Normocephalic, atraumatic.  Pupils round and reactive.  Oropharynx clear.  EOMs intact.  NECK:  Supple.  No carotid bruits.  CHEST:  Clear anterior and posterior chest walls.  No rhonchi, rales or  wheezing.  HEART:  Regular rhythm without murmur.  ABDOMEN:  Soft, round, slightly protuberant.  Bowel sounds present.  BREAST/RECTAL/GENITOURINARY:  Not done, not pertinent per present  illness.  EXTREMITIES:  Right knee, right range of motion 10-100,  marked crepitus  tender, more medial than lateral.  No effusion.   IMPRESSION:  Osteoarthritis right knee.   PLAN:  The patient was admitted to [facility] to undergo a  right total knee replacement arthroplasty.  Surgery will be performed by  Gaynelle Arabian.      Alexzandrew L. Perkins, P.A.C.      Gaynelle Arabian, M.D.  Electronically Signed    ALP/MEDQ  D:  02/08/2007  T:  02/09/2007  Job:  [id number]   cc:   Gaynelle Arabian, M.D.  Fax: [phone]   [name], M.D.  Fax: [place] Lia Foyer, MD, [place]. [place]  [place] [address]

## 2010-09-28 NOTE — H&P (Signed)
NAMENATHANIA, WALDMAN NO.:  [id number]   MEDICAL RECORD NO.:  [id number]          PATIENT TYPE:  INP   LOCATION:  0240                         FACILITY:  [facility]   PHYSICIAN:  Minus Breeding, MD, FACCDATE OF BIRTH:  Jan 08, 1935   DATE OF ADMISSION:  01/14/2007  DATE OF DISCHARGE:                              HISTORY & PHYSICAL   CARDIOLOGIST:  Dr. Bing Quarry   PRIMARY CARE PHYSICIAN:  Dr. [name]   CHIEF COMPLAINT:  Shortness of breath.   HISTORY OF PRESENT ILLNESS:  Mrs. Cella is a 76 year old female patient  with a history of coronary disease status post Taxus stenting to the LAD  in May 2007 who recently underwent elective total knee replacement on  January 01, 2007, secondary to severe osteoarthritis.  She presented to  [facility] emergency room today with sudden onset of dyspnea this  morning shortly after awakening.  She noted associated interscapular  numbness and bilateral hand numbness.  She denied chest pain, nausea,  diaphoresis, syncope or palpitations.  Her symptoms are somewhat better  now and she has received Lovenox x1 (1 mg/kg) and is on nitroglycerin  paste.  Her initial point-of-care markers have been negative x2.  Her  symptoms are completely unlike what she had prior to her LAD stenting.  She was somewhat concerned about pulmonary embolus.  D-dimer was  elevated and she did have a CT angiogram done of her chest.  This  revealed no pulmonary embolism.  She had no significant change in small  bilateral upper lobe nodules.   PAST MEDICAL HISTORY:  1. Coronary disease.      a.     Status post Taxus drug-eluting stent to the LAD in May 2007.      b.     Re-look catheterization May 2007 revealed patent stent in       the LAD, second diagonal 40% stenosis, RCA distal 40% stenosis,       and circumflex with two small subbranches with 90% stenosis.  2. Diabetes mellitus.  3. Hypothyroidism.  4. Hyperlipidemia.  5. Hypertension.  6.  Osteoarthritis status post right total knee replacement January 01, 2007.  7. Status post hysterectomy.  8. Status post cholecystectomy.  9. Status post hernia repair.  10.History of left shoulder fracture.  11.Carpal tunnel syndrome.   MEDICATIONS:  1. Coumadin - she was to take 5 mg one tablet today and one tablet      tomorrow and have her INR rechecked on Tuesday, September 2.  2. Hydrocodone/APAP 10/325 mg one to two q.4-6h. p.r.n.  3. Amaryl - the patient says that she takes this at home but it is not      on her medication list at this time.  4. Furosemide 40 mg daily.  5. Isosorbide mononitrate 60 mg daily.  6. Metformin 2 g q.h.s.  7. Metoprolol ER 50 mg q.h.s.  8. Potassium 10 mEq daily.  9. Synthroid 0.2 mg daily.  10.Lantus insulin 50 units q.h.s.   ALLERGIES:  1. PENICILLIN.  2. DEMEROL.   SOCIAL HISTORY:  The patient is married.  She has two children.  She  denies tobacco or alcohol abuse.  She is an Web designer at  [employer].   FAMILY HISTORY:  Is insignificant for premature coronary disease.   REVIEW OF SYSTEMS:  Please see HPI.  Denies any fevers, chills,  headache, sore throat.  Denies any dysuria, hematuria.  Denies any  bright red blood per rectum or melena.  Denies any dysphagia,  odynophagia.  Denies any orthopnea, PND, or pedal edema.  Denies any  significant palpitations, syncope, near-syncope.  Denies any cough.  Rest of the review of systems are negative.   PHYSICAL EXAMINATION:  She is a well-nourished, well-developed female.  Blood pressure is 138/72, pulse 84, respirations 15, temperature 97,  oxygen saturation is 92% on room air.  HEENT:  Normal.  NECK:  Without JVD.  LYMPH:  Without lymphadenopathy.  ENDOCRINE:  Without thyromegaly.  CAROTIDS:  Without bruits bilaterally.  CARDIAC:  Normal S1, S2; regular rate and rhythm without murmurs.  LUNGS:  Clear to auscultation bilaterally without wheezing, rhonchi or  rales.   ABDOMEN:  Soft, nontender, with normoactive bowel sounds, no  organomegaly.  EXTREMITIES:  Without clubbing, cyanosis.  She does have trace bilateral  edema.  MUSCULOSKELETAL:  Right total knee replacement scar healing well.  No  erythema, no discharge.  NEUROLOGIC:  She is alert and oriented x3.  Cranial nerves II-XII are  grossly intact.   Chest x-ray reveals bibasilar atelectasis, bronchitic changes.  Chest CT  reveals no pulmonary embolism, no significant change of the small  bilateral upper lobe nodules - preliminary reading.   Electrocardiogram reveals sinus rhythm, heart rate of 72, normal axis,  right bundle-branch block.   LABORATORY DATA:  WBC 6.3, HGB 11.6, HCT 34.1, PLT 513.  Sodium 140,  potassium 3.5, BUN 10, creatinine 0.58, glucose 86.  Point-of-care  markers negative x2.  INR 1.5, D-dimer 1.76.   IMPRESSION:  1. Acute dyspnea.  2. Coronary disease status post Taxus drug-eluting stent to left      anterior descending artery in May 2007.  3. Good left ventricular function.  4. Osteoarthritis status post right total knee replacement January 01, 2007.  5. Diabetes mellitus.  6. Hypertension.  7. Hyperlipidemia.  8. Hypothyroidism.   PLAN:  The patient will be placed in observation status.  We will  restart her aspirin at 81 mg daily given her history of drug-eluting  stent placement to the LAD.  She is currently off of her Plavix but  needs coverage with Coumadin status post knee replacement.  Her INR is  subtherapeutic.  She probably needs Lovenox or heparin bridging until  her INR is 2 or greater, since she is currently off of her Plavix.  She  had problems initially with Lovenox with gum bleeding and actually came  to the emergency room and had the Lovenox discontinued but was kept on  Coumadin.  She has received Lovenox in the emergency room today.  We  will continue this.  If she tolerates this, she may be able to go home  on Lovenox.  If not, she will  need IV heparin.  Once her Coumadin is  completed post knee replacement she will need to restart her Plavix.  We  will continue to cycle her cardiac markers.  If she rules out for  myocardial infarction will consider proceeding with outpatient Myoview  testing.      Richardson Dopp, PA-C  Minus Breeding, MD, [facility]  Electronically Signed    SW/MEDQ  D:  01/14/2007  T:  01/15/2007  Job:  [id number]   cc:   [name], M.D.  Fax: [phone]

## 2010-09-28 NOTE — Assessment & Plan Note (Signed)
[facility]                            CARDIOLOGY OFFICE NOTE   NAME:Tonya Hunter, Tonya Hunter                       MRN:          [id number]  DATE:04/03/2007                            DOB:          09-Dec-1934    Tonya Hunter is in for a followup visit.  In general, she is stable.  She has not  been having any major problems.  She recently had an episode of what was  thought to be profound vertigo.  She saw Dr. Ernesto Rutherford shortly thereafter  and was thought to have sinusitis.  She is slowly, gradually recovering  from her knee.  She denies any ongoing chest pain or significant  shortness of breath.   MEDICATIONS:  1. Lantus 30 units morning, 50 units in the evening.  2. Glimepiride 4 mg daily.  3. Furosemide 40 mg daily.  4. Plavix 75 mg daily.  5. Isosorbide mononitrate 60 mg daily.  6. Metoprolol 50 mg 1 in the morning and 1/2 in the p.m.  7. K-Dur 10 mEq daily.  8. Synthroid 200 mcg daily.  9. Metformin ER 500 mg 2 nightly.  10.Vytorin 10/20.  11.Aspirin 81 mg daily.   PHYSICAL EXAMINATION:  She is alert and oriented, no acute distress.  Lung fields are entirely clear.  Cardiac rhythm is regular.  Extremities reveal no definite edema.   Recent laboratory studies were obtained and revealed a BUN of 6,  creatinine of 0.6, and a potassium of 4.1.   IMPRESSION:  1. Coronary artery disease, on medical therapy.  2. Non-insulin dependent diabetes mellitus.  3. Recent knee surgery.  4. Hypercholesterolemia, on lipid-lowering therapy.   PLAN:  Return to clinic in 4 months unless further problems arise.     Loretha Brasil. Lia Foyer, MD, [facility]  Electronically Signed    TDS/MedQ  DD: 04/03/2007  DT: 04/04/2007  Job #: [id number]

## 2010-09-28 NOTE — Assessment & Plan Note (Signed)
[facility]                            CARDIOLOGY OFFICE NOTE   NAME:[place], Tonya Hunter                       MRN:          [id number]  DATE:10/11/2006                            DOB:          1934-10-14    Dot is in for followup.  In general she has been stable.  She has been  seen by multiple ophthalmologists.  There is no definite answer as to  the cause of her symptoms.  She denies any significant shortness of  breath or chest pain, she has continued to get along reasonably well.  She denies any blood in her stools.   MEDICATIONS:  1. Furosemide 40 mg daily.  2. Plavix 75 mg daily.  3. Isosorbide 60 mg daily.  4. Monopril 10 mg one-half tablet daily.  5. Metoprolol 50 mg one in the morning and one-half at night.  6. K-Dur 10 mEq daily.  7. Synthroid 200 mcg daily.  8. Lantus as directed.  9. Aspirin daily.  10.Vytorin 10/20 daily.   PHYSICAL EXAMINATION:  She is alert and oriented, she is in no acute  distress.  Blood pressure is 110/60 by me and the pulse is 61.  LUNGS:  Fields are clear.  CARDIAC:  Rhythm is regular.  There is no extremity edema.   The electrocardiogram demonstrates normal sinus rhythm with right bundle  branch block.   IMPRESSION:  1. Coronary artery disease.  2. Insulin-dependent diabetes mellitus.  3. Hypercholesterolemia on lipid lowering therapy.  4. Hypertension.   PLAN:  I have asked her to get her blood pressure checked on a frequent  basis and call us with the results.  Her blood pressure is a little bit  lower but she is feeling well.  She has had  laboratory studies by Dr. [name] and has planned followup with Dr.  [name] and Dr. Joni Fears.  We will see her back in followup in 3 months.     Loretha Brasil. Lia Foyer, MD, [facility]  Electronically Signed    TDS/MedQ  DD: 10/11/2006  DT: 10/11/2006  Job #: [id number]   cc:   [name], M.D.  Shanna Cisco., MD

## 2010-09-28 NOTE — Assessment & Plan Note (Signed)
[facility]                            CARDIOLOGY OFFICE NOTE   NAME:[place], Tonya Hunter                       MRN:          [id number]  DATE:12/25/2006                            DOB:          03-26-1935    [name] is in for followup.  She is scheduled to have surgery.  She  really is in a position now where she really can not put this off.  She  has not been having chest pain or significant shortness of breath.  She  is increasingly limited in part, because of less activity though.  Her  surgery is scheduled on January 01, 2007 with Dr. Wynelle Link.   EXAMINATION:  VITAL SIGNS:  On physical today, the blood pressure is  154/74, and the pulse is 96.  LUNGS:  The lung fields are clear.  CARDIAC:  The cardiac rhythm is regular without a significant murmur.   Electrocardiogram demonstrates sinus rhythm with right bundle branch  block.   From a cardiac standpoint, she has continued to remain stable.  She has  now out over a year from placement of a drug-eluting stent.  We have  talked about the various options.  At the present time, she will stop  her Plavix and hold it prior to surgery.  I have recommended that she  stay on aspirin, throughout the surgery.  She will continue to remain on  this medication.  She will need glucose control during the  hospitalization.  I have recommend that she continue her metoprolol as  prescribed.  She is also on daily isosorbide at 60 mg daily.  A  continued medical approach will be recommended.  She is at increased  risk, but clearly at the present time she needed to proceed with  surgery, as she is no longer able to get around.     Loretha Brasil. Lia Foyer, MD, [facility]  Electronically Signed    TDS/MedQ  DD: 01/01/2007  DT: 01/01/2007  Job #: [id number]

## 2010-09-28 NOTE — Op Note (Signed)
Tonya Hunter, APPLE NO.:  [id number]   MEDICAL RECORD NO.:  [id number]          PATIENT TYPE:  AMB   LOCATION:  [place]                          FACILITY:  [facility]   PHYSICIAN:  Daryll Brod, M.D.       DATE OF BIRTH:  12-31-1934   DATE OF PROCEDURE:  02/12/2008  DATE OF DISCHARGE:                               OPERATIVE REPORT   PREOPERATIVE DIAGNOSIS:  Carpal tunnel syndrome, right hand.   POSTOPERATIVE DIAGNOSIS:  Carpal tunnel syndrome, right hand.   OPERATION:  Decompression, right median nerve.   SURGEON:  Daryll Brod, MD   ASSISTANT:  Dimas Millin, RN   ANESTHESIA:  General.   ANESTHESIOLOGIST:  Nelda Severe. Tonya Alexander, MD.   HISTORY:  The patient is a 76 year old female with a history of carpal  tunnel syndrome, EMG nerve conductions positive with a motor delay of  greater than 12 on right side.  This has EMG nerve conduction positive.  She has elected to undergo surgical decompression.  She is aware of  risks and complications including infection, recurrence injury to  arteries, nerves, tendons, and incomplete relief of symptoms or  dystrophy.  In the preoperative area, the patient is seen.  The  extremity marked by both the patient and surgeon.  Antibiotic given.   PROCEDURE:  The patient was brought to the operating room where a  general anesthetic was carried out without difficulty under the  direction of Dr. Tobias Hunter.  A time-out was taken.  She was prepped using  DuraPrep, supine position, right arm free.  The limb was exsanguinated  with an Esmarch bandage.  Tourniquet placed in forearm and it was  inflated to 250 mmHg.  Straight incision was then made longitudinally in  the palm, carried down through subcutaneous tissue.  Bleeders were  electrocauterized, palmar fascia was split, superficial palmar arch  identified, the flexor tendon to the ring and little finger identified.  To the ulnar side of the median nerve, the carpal retinaculum was  incised with sharp  dissection.  A right-angle and Sewall retractor were  placed between skin and forearm fascia.  The fascia was released for  approximately a centimeter and half proximal to the wrist crease under  direct vision.  The canal was explored.  The area of compression to the  nerve was immediately apparent.  This showed an area of significant  hyperemia with an hourglass deformity.  The wound was irrigated.  Tenosynovial tissue was moderately thickened.  No further lesions were  identified.  The wound was irrigated.  The skin closed with interrupted  5-0  Vicryl and Rapide sutures.  A sterile compressive dressing and splint to  the wrist was applied.  The patient tolerated the procedure well and was  taken to the recovery room for observation in satisfactory condition.  She will be discharged home.  To return to the [facility]  in 1 week time, on Vicodin.           ______________________________  Daryll Brod, M.D.     GK/MEDQ  D:  02/12/2008  T:  02/12/2008  Job:  [id number]   cc:   [name], M.D.

## 2010-09-28 NOTE — Op Note (Signed)
NAMECORTNE, AMARA NO.:  [id number]   MEDICAL RECORD NO.:  [id number]          PATIENT TYPE:  AMB   LOCATION:  [place]                          FACILITY:  [facility]   PHYSICIAN:  Daryll Brod, M.D.       DATE OF BIRTH:  02-19-1935   DATE OF PROCEDURE:  03/11/2008  DATE OF DISCHARGE:                               OPERATIVE REPORT   PREOPERATIVE DIAGNOSIS:  Carpal tunnel syndrome, left hand.   POSTOPERATIVE DIAGNOSIS:  Carpal tunnel syndrome, left hand.   OPERATION:  Decompression, left median nerve.   SURGEON:  Daryll Brod, MD   ASSISTANT:  None.   ANESTHESIA:  General.   DATE OF OPERATION:  March 11, 2008.   ANESTHESIOLOGIST:  Sharolyn Douglas, MD   HISTORY:  The patient is a 76 year old female with a history of carpal  tunnel syndrome, EMG nerve conductions positive, which has not responded  to conservative treatment.  She is seen in the preoperative area.  The  extremity marked by both the patient and surgeon, questions again  encouraged and answered.  She was aware that there is no guarantee with  surgery, possibility of infection, recurrence, injury to arteries,  nerves, tendons, incomplete relief of symptoms, and dystrophy.  The  extremity has been marked, antibiotic given.   PROCEDURE:  The patient was brought to the operating room where a  general anesthetic was carried out without difficulty under the  direction of Dr. [name].  She was prepped using DuraPrep, supine  position, left arm free.  A time-out was taken.  The limb was  exsanguinated with an Esmarch bandage.  Tourniquet placed in the  forearm, was inflated to 250 mmHg.  A longitudinal incision was made in  the palm and carried down through the subcutaneous tissue.  Bleeders  were electrocauterized, palmar fascia was split, superficial palmar arch  identified, the flexor tendon to the right little finger identified to  the ulnar side of the median nerve.  Carpal retinaculum was incised with  sharp dissection.  Right angle and Sewall retractor were placed between  the skin and forearm fascia.  The fascia was released for approximately  1.5 proximal to the wrist crease under direct vision.  Canal was  explored.  Air compression to the nerve was apparent.  No further  lesions were identified.  Tenosynovial tissue was moderately thickened.  The wound was irrigated.  The skin was then closed with interrupted 5-0  Vicryl Rapide sutures.  A sterile  compressive dressing and splint to the wrist was applied.  The patient  tolerated the procedure well and was taken to the recovery room for  observation in satisfactory condition.  She will be discharged home to  return to the hand center of [facility] in 1 week on Vicodin.           ______________________________  Daryll Brod, M.D.     GK/MEDQ  D:  03/11/2008  T:  03/11/2008  Job:  [id number]   cc:   [name], M.D.

## 2010-09-28 NOTE — Assessment & Plan Note (Signed)
[facility]                            CARDIOLOGY OFFICE NOTE   NAME:Tonya Hunter                       MRN:          [id number]  DATE:02/19/2008                            DOB:          [birth year]    Tonya Hunter is in for a followup.  In general, she has been relatively  stable.  She has not been having any ongoing chest pain.  She had carpal  tunnel surgery without too much difficulty.  She has seen Dr. [name]  recently, and is scheduled to have followup labs in the near future.   MEDICATIONS:  1. Furosemide 40 mg daily.  2. Plavix 75 mg daily.  3. Isosorbide mononitrate 60 mg daily.  4. Metoprolol 50 mg one in the morning and one half in the evening.  5. K-Dur 10 mEq daily.  6. Synthroid 200 mcg daily.  7. Vytorin 10/20 nightly.  8. Aspirin 81 mg daily.  9. Lantus insulin 50 units nightly.  10.Glimepiride 4 mg daily.  11.Metformin ER 500 mg 4 tablets daily.   PHYSICAL EXAMINATION:  GENERAL:  She is alert and oriented.  VITAL SIGNS:  Initially, the blood pressure is 167/91 and recheck  146/77, pulse 81, weight is 212 pounds.  LUNG:  Fields are clear to auscultation and percussion.  CARDIAC:  Rhythm is regular.   Her electrocardiogram demonstrates normal sinus rhythm.  There is a  right bundle branch block.  There is nonspecific T-wave abnormalities.  T-waves have been down right previously in III and aVF, so I do not  think there is an interval change other than the rate being somewhat  higher.   Recent laboratory studies done as a part of our surgery revealed normal  BUN and creatinine with a normal potassium 4.7, hemoglobin of 13.5,  capillary glucose of 151.   Overall that appears to remain stable.  She will continue on her current  medical regimen.  She is scheduled to have the other carpal tunnel  worked on.  If she will remain on her current medical regimen, should  there be any change in symptoms.  She is to contact us  promptly.     Tonya Hunter. Lia Foyer, MD, [facility]  Electronically Signed    TDS/MedQ  DD: 02/19/2008  DT: 02/20/2008  Job #: [id number]

## 2010-09-28 NOTE — Discharge Summary (Signed)
Tonya Hunter, Tonya Hunter NO.:  [id number]   MEDICAL RECORD NO.:  [id number]          PATIENT TYPE:  INP   LOCATION:  0093                         FACILITY:  [facility]   PHYSICIAN:  Loretha Brasil. Lia Foyer, MD, FACCDATE OF BIRTH:  12/09/1934   DATE OF ADMISSION:  01/14/2007  DATE OF DISCHARGE:  01/17/2007                               DISCHARGE SUMMARY   DISCHARGE DIAGNOSIS:  Dyspnea.   SECONDARY DIAGNOSIS:  1. Coronary artery disease, status post Taxus drug-eluting stent      placement to the left anterior descending artery in May 2007.  2. Hypertension.  3. Hyperlipidemia.  4. Type 2 diabetes mellitus.  5. Osteoarthritis, status post right total knee replacement January 01, 2007, now on Coumadin.  6. Status post hysterectomy.  7. Status post cholecystectomy.  8. Status post hernia repair.  9. History of left shoulder fracture.  10.Carpal tunnel syndrome.  11.Obesity.   ALLERGIES:  PENICILLIN and DEMEROL.   PROCEDURES:  CT angiogram of the chest.   HISTORY OF PRESENT ILLNESS:  A 76 year old Caucasian female with the  above problem list, who is recently status post right total knee  placement January 01, 2007.  She has been on Coumadin since then.  She  presented to the [facility] ED on August 31 with complaints of dyspnea.  Cardiac markers were negative and D-dimer was elevated at 1.76.  Notably, her INR was subtherapeutic at 1.5.  She was admitted for  further evaluation.   HOSPITAL COURSE:  The patient ruled out for MI by cardiac markers.  Because of her elevated D-dimer, she underwent a CT angiogram of the  chest August 31, which was negative for pulmonary embolism and showed  stable apical pulmonary nodules.  Her Coumadin dosage was increased and  initially she was treated with Lovenox as a bridge; however, this was  discontinued at the patient's request secondary to prior history of  oropharyngeal bleeding on Lovenox.  She has not had any recurrent  dyspnea and will be discharged home September 3 in satisfactory  condition.  She will have follow-up INRs via [facility].   DISCHARGE LABS:  Hemoglobin 11.2, hematocrit 33.6, WBC 8.6, platelets  517.  PT 21.0, INR 1.8 (January 16, 2007).  Sodium 139, potassium 3.9,  chloride 102, CO2 28, BUN 11, creatinine 0.77, glucose 85, calcium 9.3.  CK 92, MB 1.8, troponin I 0.03.   DISPOSITION:  The patient will be discharged home September 3 in good  condition.   FOLLOW-UP PLANS AND APPOINTMENTS:  She has follow-up with Dr. Lia Foyer  September 30 at 11:30 a.m.  She will continue to have INR checked via  [facility].   DISCHARGE MEDICATIONS:  1. Aspirin 81 mg daily.  2. Coumadin as directed.  3. Lasix 40 mg daily.  4. Imdur 60 mg daily.  5. Metformin 2000 mg q.p.m.  6. Metoprolol 25 mg b.i.d.  7. Potassium 10 mEq daily.  8. Synthroid 200 mcg daily.  9. Vytorin 10/20 mg q.p.m.  10.Amaryl 4 mg daily.  11.Lantus 50 units q.h.s.  12.Nitroglycerin 0.4 mg sublingual p.r.n. chest pain.   OUTSTANDING LAB STUDIES:  INR will be pending the morning of September  3.   DURATION OF DISCHARGE ENCOUNTER:  40 minutes including physician time.      Murray Hodgkins, ANP      Loretha Brasil. Lia Foyer, MD, [facility]  Electronically Signed    CB/MEDQ  D:  01/16/2007  T:  01/17/2007  Job:  [id number]

## 2010-09-28 NOTE — Assessment & Plan Note (Signed)
[facility]                            CARDIOLOGY OFFICE NOTE   NAME:Hang, MARTHANN ABSHIER                       MRN:          [id number]  DATE:02/13/2007                            DOB:          1934/06/25    Dot is in for a followup visit.  Other than a little bit of nausea, she  is doing much better.  She saw Dr. [name] and her Lantus was cut back.  She had also been seen in our office and Dr. Dorris Carnes had cut back her  Amaryl to 2 mg.  Her Avandia had been stopped in the hospital.  She has  occasional episodes of mild nausea, but she is now off of pain medicine  for about a week.  This tends to be very intermittent.   CURRENT MEDICATIONS:  1. Lantus 25 mg nightly.  2. Glimepiride 2 mg daily.  3. Vytorin 10/20.  4. Aspirin 325 mg daily.  5. Synthroid 200 mcg daily.  6. K-Dur 20 mEq daily.  7. Metoprolol 50 mg in the morning and 25 in the evening.  8. Isosorbide 60 mg in the morning.  9. Plavix 75 mg daily.  10.Furosemide 40 mg daily.   Of note, the patient has had lab work done at Dr. Jacquiline Doe recently.   PHYSICAL:  The blood pressure is 115/67, pulse 72.  The lung fields are clear.  CARDIAC:  Rhythm is regular without a significant murmur noted.   IMPRESSION:  1. Known coronary artery disease status post stenting.  2. Insulin requiring diabetes mellitus.  3. History of bronchitis.  4. History of status post right knee replacement.  5. Hypothyroidism.  6. Diverticulosis.  7. Hypercholesterolemia.  8. Hypertension.   PLAN:  1. Return to clinic in 2 months in continuing followup.  2. Check labs from Dr. Jacquiline Doe office.  3. With the nausea, I also suggested that she consider cutting her      aspirin down to 81 mg.     Loretha Brasil. Lia Foyer, MD, [facility]  Electronically Signed    TDS/MedQ  DD: 02/13/2007  DT: 02/13/2007  Job #: [id number]   cc:   [name], M.D.  Shanna Cisco., MD

## 2010-09-28 NOTE — Discharge Summary (Signed)
Tonya Hunter, MARCHETTI NO.:  [id number]   MEDICAL RECORD NO.:  [id number]          PATIENT TYPE:  INP   LOCATION:  3220                         FACILITY:  [facility]   PHYSICIAN:  Gaynelle Arabian, M.D.    DATE OF BIRTH:  1934-12-03   DATE OF ADMISSION:  01/01/2007  DATE OF DISCHARGE:  01/06/2007                               DISCHARGE SUMMARY   ADMITTING DIAGNOSES:  1. Osteoarthritis right knee.  2. History of bronchitis.  3. History of pneumonia.  4. Coronary arterial disease.  5. Hypertension.  6. Hypercholesterolemia.  7. Diverticulosis.  8. Hypothyroidism.  9. Insulin-dependent diabetes mellitus.   DISCHARGE DIAGNOSES:  1. Osteoarthritis right knee status post right total knee replacement      arthroplasty.  2. Postoperative blood loss anemia and anemia did not require      transfusion.  3. History of bronchitis.  4. History of pneumonia.  5. Coronary arterial disease.  6. Hypertension.  7. Hypercholesterolemia.  8. Diverticulosis.  9. Hypothyroidism.  10.Insulin-dependent diabetes mellitus.   PROCEDURE:  On January 01, 2007, right total knee surgery Dr. Wynelle Link.  Tourniquet x51 minutes.   CONSULTS:  Medical services, Dr. Sharlett Iles, covering for Dr. [name].   BRIEF HISTORY:  Tonya Hunter is a 76 year old female with severe  osteoarthritis of the right knee with progressive worsening pain and  dysfunction, extensive nonoperative including injections now presents  for total knee arthroplasty.   LABORATORY:  The laboratory data shows admission CBC:  Hemoglobin 12.7,  hematocrit 37.1, white cell count 6.8.  Postop hemoglobin 10.2, drifted  down to 9.7, came back up to 10.2 and 29.9 with the hematocrit.  PT/PTT  preop 13.4 and 39, respectively.  INR 1.0.  Serial protimes followed.  Postop PT/INR 16.5 and 1.3.  Chem panel on admission:  Elevated ALP of  140; otherwise, chem panel within normal limits.  Serial __________ were  followed.  Electrolytes remained  within normal limits.  Preop UA:  Small  bili, otherwise negative.  Follow-up UA on January 05, 2007, trace  leukocyte esterase, only 3-6 white cells, positive nitrite.  Urine  culture:  Klebsiella.  Blood group type O positive.  EKG, December 25, 2006:  Normal sinus rhythm, possible left enlargement, right bundle  branch block.   HOSPITAL COURSE:  The patient admitted to [place] procedure well.  Later transferred to recovery room and  orthopedic floor.  Had moderate pain on the evening of surgery.  On  morning of day 1 we called in a medical consult.  Dr. Sharlett Iles was  covering for Dr. [name] and assisted with care throughout the hospital  course.  She was a known diabetic.  She was placed on sliding-scale  insulin and Glucophage was on hold along with Amaryl until her diet  improved.  Hypertension was well-controlled.  From the morning of day 1  she had moderate pain and was slow to get up out of bed.  Hemoglobin was  stable at 10.2.  By day 2, pain was not getting any better, so meds were  changed from Vicodin  over to p.o. Dilaudid.  Left the Foley in another  day.  The Amaryl was restarted by Dr. Sharlett Iles for better glucose  control. Increase MiraLax and Senokot-S for constipation.  She was still  slow to progress with her therapy, only getting up about 10 feet  although she did walk 40 feet later that afternoon.  Dressing was  changed on day 2.  Hemoglobin was down to 9.7.  Incision was healing  well.  She mainly had complaints of pain.  No complaints of the anemia.  By day 3 she was doing a little bit better.  More comfortable.  Incision  was healing well.  She was slowly progressing with therapy and felt like  she needed a little bit more time.  Constipation had resolved along with  the diabetes.  She was changed over to insulin-resistant sliding scale  and Glucophage was restarted to assist with better management per Dr.  Sharlett Iles.  By day 4 she started  to improve.  She was walking about 50-  75 feet, slowly progressing, although she did spike a temperature,  unfortunately, and we got a chest x-ray which only showed some  atelectasis.  Lovenox was added.  At the time of the of discharge the  urine culture was pending.  She did continue to slowly progress and by  the following day of January 06, 2007 she had met her goals.  She is  better controlled on medication and the chest x-ray only showed some  atelectasis.  Her temperature was back down.  She was discharged home.   DISCHARGE PLAN:  1. Was discharged home on January 06, 2007.  2. Discharge diagnoses:  Please see above.  3. Discharge meds:  Dilaudid, Robaxin, Coumadin and Lovenox.  4. Diet:  Cardiac diet, carbohydrate-modified diabetic diet.  5. Follow up:  Two weeks.   ACTIVITY:  Weightbearing as tolerated.  Total knee protocol.  Home  health PT and home health nursing.   DISPOSITION:  Home.   CONDITION ON DISCHARGE:  Slowly improving.  Please note, at the time of  the dictation the urine culture was negative.  This will be followed up  on outpatient basis.      Alexzandrew L. Perkins, P.A.C.      Gaynelle Arabian, M.D.  Electronically Signed    ALP/MEDQ  D:  02/07/2007  T:  02/08/2007  Job:  [id number]   cc:   Loretha Brasil. Lia Foyer, MD, [place]  [place]  [place] [address]   [name], M.D.  Fax: [phone]   Gaynelle Arabian, M.D.  Fax: [phone]

## 2010-09-28 NOTE — Assessment & Plan Note (Signed)
[facility]                            CARDIOLOGY OFFICE NOTE   NAME:Tonya Hunter, Tonya Hunter                       MRN:          [id number]  DATE:01/29/2007                            DOB:          June 15, 1934    IDENTIFICATION:  The patient is a 76 year old woman who is usually  followed by Tonya Hunter. Stuckey, MD.  She comes in today because she is  not feeling well, as an add-on.   The patient was recently discharged from the hospital on January 17, 2007.  Note, she is status post recent right knee replacement on January 01, 2007.  She went back into the hospital because of shortness of  breath and found to have some fluid overload.  Her medicines were  adjusted.  She was taken off of Avandia and kept on Amaryl and  metformin.   Since discharge, she is taking pain medicines for her knee, tapering  back.  She has had some nausea every day.  She is taking some fluid but  her p.o. intake seems to be down some.   Today, she was going to go to rehab and she woke up and she was very  lightheaded.  No chest pain.  She called in and was told to come here  for further evaluation.  Note, she has had some nausea and vomiting,  felt clammy.   CURRENT MEDICATIONS:  1. Amaryl 4 mg daily.  2. Lantus 50 nightly?  3. Vytorin 10/20.  4. Aspirin 325.  5. Synthroid 200 mcg.  6. Potassium 10 mEq.  7. Metoprolol 25 b.i.d.  8. Metformin 2 g q.p.m.  9. Imdur 60.  10.Lasix 40.  11.Coumadin as directed.  12.Aspirin 81 mg daily.   PHYSICAL EXAMINATION:  VITAL SIGNS:  Her blood pressure laying 112/66,  pulse 66; sitting 116/67, pulse 68; standing at 0 minutes 112/68, pulse  74 (note, patient dizzy with sitting and standing); standing at 2  minutes 122/68, pulse 77 (a little dizzy); 5 minutes 115/69, pulse 75 (a  little dizzy).  GENERAL APPEARANCE:  The patient is in no acute distress.  LUNGS:  Clear.  CARDIOVASCULAR:  Regular rate and rhythm, S1 and S2.  No S3.  No  murmurs.  ABDOMEN:  Benign.  EXTREMITIES:  Right knee looks good.  No lower extremity edema.   A 12-lead EKG shows normal sinus rhythm, 69 beats per minute, right  bundle branch block.   IMPRESSION:  1. Dizziness.  The patient is not orthostatic on exam.  She does note      some decreased intake  because of nausea.  I have encouraged her to      increase this.  Also her sugars are running a little lower than      usual in the morning, whether this is contributing.  I have asked      her to back down on her Amaryl in the morning to 2 and we will      follow up with her.  I did not check any blood work as the patient  was here after 5:00 and the phlebotomist was gone.  Again, we will      have one of the message nurses contact her tomorrow to see how she      is doing.  2. Coronary artery disease.  History of coronary artery disease with      stent placement to the left anterior descending in May 2007.  I am      not convinced this is any active process of ischemia going on.      Will follow closely.  3. Hypertension.  Not orthostatic.  4. Dyslipidemia.  Continue medications.  5. Diabetes.  As noted above.  6. Arthritis, status post right knee replacement.  Try to taper her      pain medications as she is doing.  These may contribute to above.   FOLLOW UP:  As planned, again as noted above.     Fay Records, MD, [facility]  Electronically Signed    PVR/MedQ  DD: 01/29/2007  DT: 01/30/2007  Job #: [id number]

## 2010-09-28 NOTE — Op Note (Signed)
Tonya Hunter, FREE NO.:  [id number]   MEDICAL RECORD NO.:  [id number]          PATIENT TYPE:  INP   LOCATION:  1618                         FACILITY:  [facility]   PHYSICIAN:  Gaynelle Arabian, M.D.    DATE OF BIRTH:  1934-07-02   DATE OF PROCEDURE:  01/01/2007  DATE OF DISCHARGE:                               OPERATIVE REPORT   PREOPERATIVE DIAGNOSES:  Osteoarthritis right knee.   POSTOPERATIVE DIAGNOSES:  Osteoarthritis right knee.   PROCEDURE:  Right total knee arthroplasty.   SURGEON:  Dr. Pilar Plate Aluisio.   ASSISTANT:  Nurse assist.   ANESTHESIA:  General with postop Marcaine pain pump.   ESTIMATED BLOOD LOSS:  Minimal.   DRAINS:  Hemovac x1.   TOURNIQUET TIME:  51 minutes at 300 mmHg.   COMPLICATIONS:  None.   CONDITION:  Stable to recovery.   CLINICAL NOTE:  Tonya Hunter is a 76 year old female with severe end-stage  arthritis of the right knee with progressively worsening pain and  dysfunction.  She has had extensive nonoperative management including  injections and presents now for a total knee arthroplasty due to  worsening symptoms and function.   PROCEDURE IN DETAIL:  After successful administration of general  anesthetic, a tourniquet was placed high on the right thigh and right  lower extremity was prepped and draped in the usual sterile fashion.  Extremities wrapped in Esmarch, knee flexed and tourniquet inflated to  300 mmHg. A midline incision made with a 10 blade through the  subcutaneous tissue to the level of the extensor mechanism.  A fresh  blade is used make a medial parapatellar arthrotomy.  The soft tissue  over the proximal medial tibia is subperiosteally elevated to the joint  line with the knife and into the semimembranosus bursa with the Cobb  elevator. The soft tissue laterally is elevated with attention being  paid to avoiding the patellar tendon on the tibial tubercle. The patella  was everted, knee flexed 90 degrees, ACL and  PCL removed. A drill was  used to create a starting hole in the distal femur and canal was  thoroughly irrigated.  A 5 degree right valgus alignment guide is placed  and referencing off the posterior condyles, rotation is marked and a  block pinned to remove 10 mm off the distal femur.  Distal femoral  resection is made with an oscillating saw.  A sizing block is placed and  a size 4 is the most appropriate.  Rotation is marked off the  epicondylar axis.  A size 4 cutting block placed and the anterior,  posterior and chamfer cuts made.   The tibia is subluxed forward and the menisci are removed.  Extramedullary tibial alignment guide is placed referencing proximally  at the medial aspect of the tibial tubercle and distally along the  second metatarsal axis and tibial crest.  The block is pinned to remove  approximately 10 mm off the nondeficient lateral side.  We had to go  down further to get to the base of the medial defect.  Resection is made  with an oscillating  saw.  A size 4 was the most appropriate tibial  component and the proximal tibia is prepared with the modular drill and  keel punch for a size 4.  Femoral preparation is completed with the  intercondylar cut.   A size 4 mobile bearing tibial trial, size 4 posterior stabilized  femoral trial and a 12.5-mm posterior stabilized rotating platform  insert trial are placed.  With the 12.5 full extension is achieved with  excellent varus and valgus balance throughout full range of motion.  The  patella was then everted and thickness measured to be 23 mm.  Freehand  resection was taken to 14 mm, 38 template is placed, lug holes were  drilled, trial patella was placed and it tracks normally.  Osteophytes  removed off the posterior femur with the trials in place.  All trials  are removed and the cut bone surfaces are prepared with pulsatile  lavage.  Cement is mixed and once ready for implantation the size 4  mobile bearing tibial  tray, size 4 posterior stabilized femur and 38  patella are cemented into place, patella is held with a clamp.  A trial  15 insert is placed, knee held in full extension and all extruded cement  removed.  Once the cement is fully hardened then the wound is copiously  irrigated and the FloSeal is attempted to be injected in the posterior  capsule.  It is noted that the FloSeal was not of appropriate  consistency and thus could not be used.  We further irrigated and then  placed the permanent 15 mm posterior stabilized rotating platform  insert.  The extensor mechanism was then closed over a Hemovac drain  with interrupted #1 PDS.  Flexion against gravity was 135 degrees with  normal tracking of the patella.  The tourniquet was then released for a  total time of 51 minutes. The subcu was closed with interrupted 2-0  Vicryl, subcuticular with running 4-0 Monocryl.  The incision is cleaned  and dried and Steri-Strips and a bulky dressing are applied.  The  catheter for the Marcaine pain pump is placed and then the pump is  initiated.  Steri-Strips and a bulky sterile dressing are applied and  the drain is hooked to suction.  She is placed into a knee immobilizer,  awakened and transported to recovery in stable condition.      Gaynelle Arabian, M.D.  Electronically Signed     FA/MEDQ  D:  01/01/2007  T:  01/02/2007  Job:  [id number]

## 2010-09-29 ENCOUNTER — Ambulatory Visit (HOSPITAL_COMMUNITY): Payer: Self-pay

## 2010-09-29 ENCOUNTER — Encounter (HOSPITAL_COMMUNITY): Payer: Medicare Other

## 2010-09-30 NOTE — Procedures (Unsigned)
LOWER EXTREMITY VENOUS REFLUX EXAM  INDICATION:  Painful left lower extremity.  EXAM:  Using color-flow imaging and pulse Doppler spectral analysis, the left common femoral, superficial femoral, popliteal, posterior tibial, greater and lesser saphenous veins are evaluated.  There is no evidence suggesting deep venous insufficiency in the left lower extremity.  The left saphenofemoral junction is competent.  The left GSV is competent.  The left proximal small saphenous vein demonstrates competency.  GSV Diameter (used if found to be incompetent only)                                           Right    Left Proximal Greater Saphenous Vein           cm       cm Proximal-to-mid-thigh                     cm       cm Mid thigh                                 cm       cm Mid-distal thigh                          cm       cm Distal thigh                              cm       cm Knee                                      cm       cm  IMPRESSION: 1. Left greater saphenous vein is competent. 2. The left greater saphenous vein is not tortuous. 3. The deep venous system is competent. 4. The left small saphenous vein is competent.  ___________________________________________ Rosetta Posner, M.D.  LT/MEDQ  D:  09/22/2010  T:  09/22/2010  Job:  [id number]

## 2010-10-01 ENCOUNTER — Ambulatory Visit (HOSPITAL_COMMUNITY): Payer: Self-pay

## 2010-10-01 ENCOUNTER — Encounter (HOSPITAL_COMMUNITY): Payer: Medicare Other

## 2010-10-01 NOTE — Assessment & Plan Note (Signed)
[facility]                            CARDIOLOGY OFFICE NOTE   NAME:Tonya Hunter, Tonya Hunter                       MRN:          [id number]  DATE:06/20/2006                            DOB:          1934/07/19    Tonya Hunter is in for a followup visit.  She is stable.  She has not been  having any chest pain.  The biggest problem seems to be intermittent  episodes of visual disturbance.  She says she gets diamond-like sparkles  in both eyes.  She has seen Dr. Kathrin Penner.  She apparently was thought  to have a little bit of hemorrhage behind both eyes, but she was seen by  Dr. Zadie Rhine and then apparently he told her he was not sure what the  problem was and referred her to Dr. Erling Cruz.  Dr. Erling Cruz saw her and  apparently an MRA was done which was not revealing.  Finally, she has  had carotid Dopplers which have been negative as well.  She still has  concerns.  She says this is almost like what she had with her cataract  problem.  She has questioned whether she should go to [facility] for  further opinion, which had been suggested by Dr. Kathrin Penner according  to the patient.   She denies any ongoing chest pain.  She is also having problems with her  knees which have been a consistent and ongoing issue.  She potentially  will have knee surgery at the end of this period.   Her medications include:  1. Furosemide 40 mg daily.  2. Plavix 75 mg daily.  3. Isosorbide mononitrate 60 mg daily.  4. Monopril 10 mg one-half tablet daily.  5. Metoprolol 50 mg one in the a.m. and one q.h.s.  6. K-Dur 10 mEq daily.  7. Synthroid 200 mcg daily.  8. Lantus as directed.  9. Avandaryl 4/4 mg which has been prescribed by Dr. [name].  10.Two aspirin daily.  11.Vytorin 10/20.   On physical examination, she is alert and oriented.  The blood pressure  is 133/72, the pulse is 62.  The carotid upstrokes are brisk.  The lung  fields are clear.  The cardiac rhythm is regular without a  significant  murmur noted.   The patient's electrocardiogram demonstrates normal sinus rhythm.  There  is right bundle-branch block.   The patient currently remains stable.   IMPRESSION:  1. Coronary artery disease status post stenting with drug-eluting      platform and diffuse disease.  2. Insulin-dependent diabetes mellitus.  3. Recurrent visual disturbances of uncertain etiology.  4. Severe degenerative arthritis, probably requiring total knee      replacements.  5. Abnormal chest x-ray with lung nodule.  CT scheduled in May 2008      for followup.  6. History of hypercholesterolemia on lipid-lowering therapy.   PLAN:  1. Return to clinic in 3 months.  2. CT will need to be repeated.  3. She wanted to see an ophthalmologist at [facility] and I have      encouraged her to call Dr.  Stoneburner who had recommended that      previously for that to be arranged.  4. I will defer management of her hyperlipidemia to her primary care      physician.     Loretha Brasil. Lia Foyer, MD, [facility]  Electronically Signed    TDS/MedQ  DD: 06/20/2006  DT: 06/20/2006  Job #: [id number]

## 2010-10-01 NOTE — H&P (Signed)
[place]  Patient:    Tonya Hunter, Tonya Hunter Visit Number: [id number] MRN: [id number]          Service Type: MED Location: [phone] Attending Physician:  [name] Cleverly Dictated by:   Earnstine Regal, M.D. Admit Date:  10/01/2001   CC:         [name]. [name], M.D.  Loretha Brasil. Lia Foyer, M.D. [facility]   History and Physical  REASON FOR ADMISSION:  Cholelithiasis, biliary colic.  REFERRING PHYSICIAN:  Dr. Nanine Means. [name] and Dr. Orlie Dakin.  BRIEF HISTORY:  The patient is a 75 year old white female who presents to the emergency department at the request of Dr. [name] for right upper quadrant abdominal and right chest pain radiating to the back.  The patient has had a several-month history of intermittent pain.  This occurs mainly in the morning after breakfast.  It is associated with nausea.  However, over the past five days the pain has been more severe.  It has been occurring at night as well as in the mornings.  The patient has had emesis.  The patient was seen within the past week by both Dr. [name] and Dr. Addison Lank, her cardiologist.  The patient was sent today to the radiology department where she underwent ultrasound of the abdomen demonstrating a solitary free-floating gallstone within the gallbladder.  There was no wall thickening.  There was no pericholecystic fluid.  There was no biliary dilatation.  There was fatty infiltration of the liver.  Chest x-ray showed no active disease.  The patient was then sent to the emergency department where she was seen and evaluated by Dr. Winfred Leeds.  Laboratory studies were obtained showing a normal white count of 7.6; hemoglobin 14.0; platelet count 320,000.  Differential was normal. Chemistry profile was normal with the exception of a glucose of 198 and an SGOT of 47.  Urinalysis was benign.  General surgery is consulted at this time for treatment of symptomatic  cholelithiasis.  PAST MEDICAL HISTORY:  History of coronary artery disease, history of hypertension, history of type 2 diabetes mellitus, status post umbilical herniorrhaphy by Dr. Marylene Buerger five years ago, status post ovarian cystectomy approximately 40 years ago, status post vaginal hysterectomy.  MEDICATIONS: 1. Lantus 28 units subcu q.h.s. 2. Lasix 20 mg p.o. q.d. 3. Glucophage 2000 mg q.p.m. 4. Glucotrol 20 mg q.a.m. 5. Isordil. 6. Hydrochlorothiazide. 7. Synthroid 0.175 mg daily. 8. Plavix.  ALLERGIES:  PENICILLIN causing difficulty with ambulation.  SOCIAL HISTORY:  The patient is employed as an Web designer at [employer].  She does not smoke.  She does not drink alcohol.  REVIEW OF SYSTEMS:  A 15-system review without significant other positive except at noted above.  FAMILY HISTORY:  Unremarkable.  PHYSICAL EXAMINATION:  GENERAL:  A 76 year old well-developed, well-nourished white female in no acute distress.  VITAL SIGNS:  Temperature 98.2, pulse 68, respirations 20, blood pressure 123/65.  HEENT:  Normocephalic, atraumatic.  Sclerae are clear.  Dentition is fair. Mucous membranes are dry.  LUNGS:  Clear to auscultation.  CARDIAC:  Shows a regular rate and rhythm.  ABDOMEN:  Soft without distention.  There are bowel sounds present.   There is a well-healed surgical wound just below the umbilicus.  There is a lower right paramedian incision consistent with previous ovarian cystectomy.  There are no palpable masses.  There is mild tenderness in the epigastrium and right upper quadrant.  There are no palpable masses.  There are  no peritoneal signs. There is no Murphys sign.  EXTREMITIES:  Nontender with trace edema at the ankles.  NEUROLOGIC:  The patient is alert and oriented to person, place, and time without focal neurologic deficit.  LABORATORY STUDIES:  Complete blood count:  White count 7.6; hemoglobin 14.0; hematocrit 40.4%; platelet  count 320,000.  Differential shows 58% neutrophils, 33% lymphocytes, 6% monocytes, 3% eosinophils.  Chemistry profile is notable for the following abnormalities:  Glucose 198, SGOT 47; otherwise, values are within range.  Lipase is normal at 47.  Urinalysis is benign.  Chest x-ray shows no active disease.  EKG shows right bundle-branch block without acute change.  IMPRESSION: 1. Cholelithiasis. 2. Probable biliary colic. 3. Fatty infiltration of the liver. 4. Type 2 diabetes. 5. Hypertension. 6. Coronary artery disease.  PLAN: 1. Admission to [facility] 2. Control the pain and nausea. 3. For cholecystectomy this admission. 4. Diabetes management per Dr. [name]. 5. Routine postoperative care. Dictated by:   Earnstine Regal, M.D. Attending Physician:  [name] Cleverly DD:  10/01/01 TD:  10/01/01 Job: [id number]

## 2010-10-01 NOTE — Op Note (Signed)
[place]  Patient:    Tonya Hunter, Tonya Hunter Visit Number: [id number] MRN: [id number]          Service Type: MED Location: [phone] Attending Physician:  [name] Cleverly Dictated by:   Earnstine Regal, M.D. Proc. Date: 10/02/01 Admit Date:  10/01/2001   CC:         [name]. [name], M.D.   Operative Report  PREOPERATIVE DIAGNOSES: 1. Unrelenting biliary colic. 2. Cholelithiasis.  POSTOPERATIVE DIAGNOSES: 1. Unrelenting biliary colic. 2. Cholelithiasis.  PROCEDURE:  Laparoscopic cholecystectomy with intraoperative cholangiography.  SURGEON:  Earnstine Regal, M.D.  ASSISTANT:  Laure Kidney, R.N.F.A.  ANESTHESIA:  General.  ESTIMATED BLOOD LOSS:  Minimal.  PREPARATION:  Betadine.  COMPLICATIONS:  None.  INDICATION:  The patient is a 76 year old white female who presents with three-week history of intermittent right upper quadrant abdominal pain radiating to the back.  The patient presented to the emergency department. Ultrasound demonstrated a solitary gallstone.  No ductal dilatation was identified.  Liver function tests showed minimal abnormality.  The patient was prepared and brought to the operating room for cholecystectomy.  DESCRIPTION OF PROCEDURE:  The procedure was done in OR #5 at the [facility].  The patient was brought to the operating room and placed in a supine position on the operating room table.  Following the administration of general anesthesia, the patient was prepped and draped in the usual strict aseptic fashion.  After ascertaining that an adequate level of anesthesia had been obtained, a supraumbilical incision was made with a #15 blade in the midline.  Dissection was carried down to the fascia.  Fascia was incised in the midline and the peritoneal cavity is entered cautiously.  A 0 Vicryl pursestring suture is placed in the fascia.  A Hasson cannula is introduced under direct vision  and secured with the pursestring suture.  The abdomen is insufflated with carbon dioxide.  The laparoscope is introduced under direct vision and the abdomen explored.  There are some adhesions to the patients previous lower abdominal surgery sites.  The liver appears to have considerable fatty infiltration.  Operative ports are placed along the right costal margin in the midline, midclavicular line, and anterior axillary line. Fundus of the gallbladder is grasped and retracted cephalad.  The gallbladder is nearly completely intrahepatic.  The fundus of the gallbladder is grasped. Adhesions to the duodenum are carefully taken down to provide exposure.  With some difficulty the cystic duct is dissected out.  A clip is placed at the neck of the gallbladder.  Cystic duct is incised, and a Cook cholangiography catheter is introduced to the right upper quadrant through a stab wound.  The [facility] catheter is inserted into the cystic duct and secured with a ligaclip. Using C-arm fluoroscopy, real-time cholangiography is performed.  There is rapid filling of a normal common bile duct.  There is free flow distally into the duodenum with slight reflux into the pancreatic duct.  There is reflux of contrast into the right and left hepatic ductal systems.  There are no filling defects and no evidence of obstruction.  The clip is withdrawn, as is the [facility] catheter.  The cystic duct is triply clipped and divided.  The cystic artery is dissected out along its length, triply clipped, and divided.  The gallbladder is then excised from the gallbladder bed using a hook electrocautery for hemostasis.  The gallbladder is extracted through the umbilical port without difficulty.  The 0  Vicryl pursestring suture is tied securely.  The right upper quadrant is irrigated with warm saline, which is evacuated.  Good hemostasis was noted.  The ports are removed under direct vision and pneumoperitoneum is released.  All port  sites are anesthetized with local anesthetic.  All wounds are closed with interrupted 4-0 Vicryl subcuticular sutures.  Wounds are washed and dried and benzoin and Steri-Strips are applied.  Sterile gauze dressings are applied.  The patient is awakened from anesthesia and brought to the recovery room in stable condition.  The patient tolerated the procedure well. Dictated by:   Earnstine Regal, M.D. Attending Physician:  [name] Cleverly DD:  10/02/01 TD:  10/03/01 Job: [id number]

## 2010-10-01 NOTE — Discharge Summary (Signed)
NAMEMarland Kitchen  Tonya Hunter, Tonya Hunter NO.:  [id number]   MEDICAL RECORD NO.:  [id number]          PATIENT TYPE:  INP   LOCATION:  2005                         FACILITY:  [facility]   PHYSICIAN:  Glori Bickers, M.D. LHCDATE OF BIRTH:  03-Oct-1934   DATE OF ADMISSION:  10/06/2005  DATE OF DISCHARGE:  10/08/2005                                 DISCHARGE SUMMARY   PROCEDURES:  1.  Cardiac catheterization.  2.  Coronary arteriogram.  3.  Left ventriculogram.  4.  CT the chest with contrast.   DISCHARGE DIAGNOSES:  Bilateral arm numbness.  Status post cardiac  catheterization showing no new disease.  CT of the chest without acute  changes.   SECONDARY DIAGNOSES:  1.  Status post bilateral arm numbness associated with chest tightness on      Oct 04, 2005, status post drug-eluting stent to the LAD.  2.  Type 2 by the diabetes.  3.  Hypothyroidism.  4.  Hyperlipidemia.  5.  Hypertension.  6.  Status post hysterectomy, cholecystectomy and hernia repair.  7.  Obesity.  8.  Allergy or intolerance to PENICILLIN and STATINS      which caused      swelling  9.  Osteoarthritis with knee problems.   Time of discharge 33 minutes.   HOSPITAL COURSE:  Tonya Hunter is a 76 year old female who had recurrent  bilateral arm numbness which has previously been an anginal equivalent.  She  came to the emergency room and was admitted for further evaluation and  treatment.   Her cardiac enzymes were negative for MI.  It was felt that she needed  cardiac catheterization to rule out in-stent restenosis.  A cardiac  catheterization was performed on Oct 06, 2005.   Dr. Lia Foyer performed a heart catheterization and it showed a patent LAD  stent with irregular margins and diffuse circumflex disease.  A terminal  branch of the first marginal had a subtotal occlusion of 90%, but this was  similar to what she had previously.  The second marginal branch was large in  caliber and free of significant disease.   Dr. Lia Foyer felt her symptoms were  atypical and the CT was indicated to rule out abnormalities.   His chest CT showed no PE or dissection.  There was a right upper lobe  nodule which will need follow-up.   On Oct 08, 2005, Tonya Hunter's arm numbness had resolved.  Hemoglobin A1c was  7.4.  The right upper lobe nodule will need follow-up CT in 3 months.  She  was evaluated by Dr. Haroldine Laws and considered stable for discharge with  outpatient follow-up arranged.   DISCHARGE INSTRUCTIONS:  1.  Her activity level is to be per the cath discharge instruction sheet.  2.  She is to follow up with Dr. Lia Foyer as scheduled.  She is to follow up      with Dr. [name] as needed.  3.  She is to get a repeat CT of the chest in 3 months.  4.  She is to stick to a low-fat diabetic diet.   DISCHARGE  MEDICATIONS:  1.  Plavix 75 mg daily.  2.  Aspirin 325 mg daily.  3.  Metoprolol 50 mg a.m., 25 mg p.m.  4.  Zetia 10 mg daily.  5.  Imdur 30 mg daily.  6.  Glucophage is on hold until Oct 10, 2005.  7.  Synthroid 200 mcg daily.  8.  Lantus 50 units q.h.s.  9.  Avandia daily.  10. Sublingual nitroglycerin p.r.n.      Rosaria Ferries, P.A. [facility]      Glori Bickers, M.D. [facility]  Electronically Signed    RB/MEDQ  D:  10/08/2005  T:  10/09/2005  Job:  [id number]   cc:   [name], M.D.  Fax: [phone]

## 2010-10-01 NOTE — H&P (Signed)
NAMEMarland Kitchen  Tonya Hunter, Tonya Hunter NO.:  [id number]   MEDICAL RECORD NO.:  [id number]          PATIENT TYPE:  EMS   LOCATION:  [facility]                         FACILITY:  [facility]   PHYSICIAN:  [name], M.D.     DATE OF BIRTH:  03-09-1935   DATE OF ADMISSION:  10/04/2005  DATE OF DISCHARGE:                                HISTORY & PHYSICAL   Admission for chest pain and unstable angina.   Tonya Hunter is a 76 year old patient of Tonya Hunter and Tonya Hunter.  She has  coronary risk factors including positive family history and diabetes.   She has been having increasing paresthesias in her upper extremities for  about a week.  This morning at about 3 a.m. she had these symptoms  accompanied with substernal chest pain.  She took nitroglycerin with relief.   The patient says she has a history of coronary disease.  She had a  catheterization three years ago apparently and it is unclear to me that she  had critical disease.  No angioplasty or stent was performed, but she said  she had five blockages.   The patient has seen Tonya Hunter in the office since then.  She does not  know the dosages of her medications.   REVIEW OF SYSTEMS:  Otherwise remarkable for no palpitations, no dyspnea, no  presyncope.  The patient still works full-time.   She is allergic to PENICILLIN.   She does not know the dosages of her medications except for Lantus 30 units  at night.  She also is on Avandia, Imdur and Synthroid.   She has had previous hernia surgery by Tonya Hunter, M.D.   PHYSICAL EXAMINATION:  VITAL SIGNS:  Her blood pressure is 110/70, pulse 70  and regular.  LUNGS:  Clear.  CARDIAC:  There is an S1 and S2 with normal heart sounds.  ABDOMEN:  Benign.  EXTREMITIES:  Lower extremities with intact pulses, no edema.   Labs are pending.  Chest x-ray is normal.  EKG shows sinus rhythm with right  bundle, which is old.  There are no acute changes.   IMPRESSION:  The patient will be ruled  out for myocardial infarction.  We  will hydrate her.  Given her history of probably moderate coronary disease  and diabetes, I think the best risk-stratifying procedure would be heart  catheterization.  She did not have any problems with her last procedure.   We will get our office records, and her husband will go home and get the  dosages of her other medications.  The risks of catheterization including  stroke, bleeding, contrast reaction, need for emergent surgery, were  discussed.  She is willing to proceed.   She will be started on heparin in the emergency room.   She was given three aspirin on arrival.           ______________________________  [name], M.D.     PN/MEDQ  D:  10/04/2005  T:  10/04/2005  Job:  [id number]

## 2010-10-01 NOTE — Cardiovascular Report (Signed)
NAMEMarland Kitchen  Tonya Hunter, Tonya Hunter NO.:  [id number]   MEDICAL RECORD NO.:  [id number]          PATIENT TYPE:  INP   LOCATION:  2807                         FACILITY:  [facility]   PHYSICIAN:  Eustace Quail, M.D. [facility] DATE OF BIRTH:  Mar 29, 1935   DATE OF PROCEDURE:  10/04/2005  DATE OF DISCHARGE:                              CARDIAC CATHETERIZATION   CLINICAL HISTORY:  Tonya Hunter is 76 years old, has insulin-dependent diabetes  and has previously diagnosed nonobstructive coronary disease.  She was  admitted to the emergency room today with unstable angina by Tonya Hunter and  scheduled for evaluation with angiography.   PROCEDURE:  The procedure was performed via the right femoral artery using  arterial sheath and 6-French performed coronary catheters.  A front wall  anteriorpuncture was performed, and Omnipaque contrast was used.  After  completion of the diagnostic study, we made a decision to do an intervention  on the lesion in the left anterior descending artery.   The patient was given Angiomax bolus and infusion and was given a load of  600 mg of Plavix and 20 of Pepcid.  We used a Q 3.56-French guiding catheter  with side holes and a Prowater wire.  We crossed the lesion in the proximal  LAD with a wire without too much difficulty.  We predilated with a 2.25 x 15-  mm Maverick performing two inflations up to 10 atmospheres for 30 seconds.  We then attempted to deploy a 2.5 x 24 mm Taxus stent but were unable to  advance the stent to the lesion due to calcification and tortuosity.  We  then passed a buddy wire and again attempted to pass the 2.5 x 24 mm stent  but again were unsuccessful.  We then downgraded to a 2.5 x 20 mm Taxus  stent, and with a great deal of difficulty with deep throating the guiding  catheter, we were able to advance the stent to the proper position, and it  just barely covered the lesion.  We dilated this with 1 inflation of 9  atmospheres for 30 seconds.   We post dilated to 2.5 x 20 mm Quantum Maverick  performing up one inflation up to 15 atmospheres for 30 seconds.  We then  post dilated with a 2.75 x 12-mm Quantum Maverick performing two inflations  up to 18 atmospheres for 30 seconds.  Final diagnostic study was then  performed through the guiding catheter.  The patient tolerated the procedure  well and left the laboratory in satisfactory condition.   RESULTS:  Aortic pressure is 110/58 with mean of 80 and left ventricular  pressure was 110/8.   Left main coronary artery.  The left main coronary artery was free of  significant disease.   Left anterior descending artery.  The left anterior descending artery gave  rise to small and large diagonal branches, a large septal perforator and a  small diagonal branch.  There was 90% stenosis in the proximal LAD after the  two diagonal branches and first septal perforator.  There was moderately  heavy calcification in the lesion.  There is also a 40% lesion in the  proximal portion of the second diagonal branch.   Circumflex artery.  The circumflex artery gave rise to an atrial branch, a  moderately large marginal branch and two posterolateral branches.  The  marginal branch divided into two sub-branches, each which had a 90%  stenosis.   Right coronary artery.  The right coronary artery is a moderate-sized vessel  that gave rise to a right ventricular branch, a posterior descending branch  and posterolateral branch.  There was 40% narrowing in the mid-to-distal  vessel, and there were irregularities in the mid and distal vessel.   Left ventriculogram. The left ventriculogram performed in the RAO projection  showed good wall motion with no areas of hypokinesis.  Estimated ejection  fraction was 70%.   Following stenting of the lesion in the proximal  the stenosis improved from  90% to 0%.  There were some lucencies in the distal portion of the stent.  We were not certain whether this  represented a prolapsed plaque or whether  this might have been altered densities related to calcium overlying parts of  the stent.  We felt we had good balloon expansion, and we went to a very  high pressures with the 2.75 balloon.   CONCLUSION:  1.  Coronary artery disease with 90% stenosis in the proximal LAD and 40%      narrowing in the second diagonal branch, 90% narrowing in two small sub      branches of the marginal branch of circumflex artery, 40% narrowing of      the distal right coronary artery with normal LV function.  2.  Successful percutaneous coronary intervention of the lesion in the      proximal LAD using a Taxus drug-eluting stent with improvement in      narrowing from 90% to 0%.   DISPOSITION:  The patient was returned to he angio suite for further  observation.  Because of the possible tissue prolapse, we elected to use  bail-out Integrilin at the end of the procedure which we will continue for  12 hours.           ______________________________  Eustace Quail, M.D. [facility]     BB/MEDQ  D:  10/04/2005  T:  10/04/2005  Job:  [id number]   cc:   Tonya Hunter., M.D.  [address] [place]  [place] [address]   Tonya Hunter, M.D.  Fax: [phone]

## 2010-10-01 NOTE — Op Note (Signed)
NAME:  IVADELL, GAUL                          ACCOUNT NO.:  [id number]   MEDICAL RECORD NO.:  [id number]                   PATIENT TYPE:  INP   LOCATION:  0002                                 FACILITY:  [facility]   PHYSICIAN:  Earnstine Regal, M.D.                DATE OF BIRTH:  [birth year]   DATE OF PROCEDURE:  03/26/2002  DATE OF DISCHARGE:                                 OPERATIVE REPORT   PREOPERATIVE DIAGNOSIS:  Incarcerated ventral incisional hernia.   POSTOPERATIVE DIAGNOSIS:  Incarcerated ventral incisional hernia.   PROCEDURE:  Laparoscopic ventral incisional hernia repair with Gore-Tex dual  mesh.   SURGEON:  Earnstine Regal, M.D.   ASSISTANT:  Lew Dawes. Rosana Hoes, M.D.   ANESTHESIA:  General per Nena Alexander. Fortune, M.D.   ESTIMATED BLOOD LOSS:  Minimal.   PREPARATION:  Betadine.   COMPLICATIONS:  None.   INDICATIONS:  The patient is a 76 year old white female, who has previously  undergone laparoscopic cholecystectomy and previous ventral hernia repair.  The patient has now developed a recurrent ventral incisional hernia with  incarcerated omentum.  She comes to surgery for repair.   DESCRIPTION OF PROCEDURE:  The procedure is done in OR #6 at the [facility].  The patient is brought to the operating room and placed  in a supine position on the operating room table.  Following the  administration of general anesthesia, the patient is prepped and draped in  the usual strict aseptic fashion.  After ascertaining that an adequate level  of anesthesia had been obtained, an incision is made in the left upper  quadrant of the abdominal wall with a #15 blade and carried down to the  fascia.  The fascia is incised.  An 0 Vicryl pursestring suture is placed in  the external and internal oblique fascias.  The peritoneum is incised, and  the peritoneal cavity is entered cautiously.  A 12 mm Hasson cannula is  introduced under direct vision and secured with  the pursestring suture.  The  abdomen is insufflated with carbon dioxide.  The laparoscope is introduced  and the abdomen explored.  There are adhesions of omentum into the hernia at  the level of the umbilicus.  The defect measures approximately 5 cm in  greatest dimension.  Operative ports are placed in the left lower quadrant  and right mid abdomen with 5 mm trocars.  Adhesions and omental tissue are  extracted from the hernia.  Hemostasis is obtained with the electrocautery.  Sharp dissection is employed with the Endoshears.  After clearing the  hernia, the margins of the defect are marked on the skin.  A 4 cm margin is  then measured circumferentially.  This measurement is 15 x 17 cm.  Therefore, a sheet of Gore-Tex dual mesh measuring 15 x 19 cm is employed in  its entirety.  The mesh is oriented with  alphanumeric marks at the four  cardinal points.  Then 8-0 Novofil sutures are placed around the margins of  the mesh.  The mesh is then rolled and inserted into the peritoneal cavity  through the Hasson cannula without difficulty.  The mesh is unrolled and is  oriented with the corrugated side towards the abdominal wall and the  alphanumeric orientation appropriately situated.  Incisions are made at each  of the eight points around the diagram on the abdominal wall.  Using the  EndoCatch, the suture material of 0 Novofil is brought through the abdominal  wall at each of eight points.  Sutures are then tied securely and suture  tags excised.  The mesh appears to be well-situated against the anterior  abdominal wall, widely covering the defect.  Next, the tacking device is  employed to place two concentric rows of tacks around the periphery of the  mesh, securing it generously to the abdominal wall.  Good hemostasis is  noted throughout.  Pneumoperitoneum is released, and a scope is used to  watch the abdominal wall reapproximate to the anterior viscera.  All ports  are removed.  Then the  0 Vicryl pursestring suture is tied securely.  Wounds  are anesthetized with local anesthetic.  All three laparoscopic port wounds  are closed with interrupted 4-0 Vicryl subcuticular sutures.  All wounds are  washed and dried.  Benzoin and Steri-Strips are then applied to all wounds.  Sterile dressings are applied.  The patient will be placed in an abdominal  binder prior to transfer to the recovery room.  The patient tolerated the  procedure well.                                               Earnstine Regal, M.D.    TMG/MEDQ  D:  03/26/2002  T:  03/26/2002  Job:  [id number]   cc:   [name], M.D.  [address]  [place]  [place] [address]  Fax: [place] Lia Foyer, M.D. [facility]

## 2010-10-01 NOTE — H&P (Signed)
NAMEMarland Kitchen  BALI, LYN NO.:  [id number]   MEDICAL RECORD NO.:  [id number]          PATIENT TYPE:  INP   LOCATION:  1826                         FACILITY:  [facility]   PHYSICIAN:  Kirk Ruths, M.D. LHCDATE OF BIRTH:  [birth year]   DATE OF ADMISSION:  10/06/2005  DATE OF DISCHARGE:                                HISTORY & PHYSICAL   SUMMARY OF HISTORY:  Mrs. Sproule is a 76 year old white female who presents  from the ER complaining of bilateral arm numbness.  She was discharged  yesterday at 4:30 p.m. after being hospitalized with chest discomfort.  She  stated for a week prior to her Oct 04, 2005, admission she was having  bilateral hand numbness.  However, on Oct 04, 2005, she had bilateral arm  numbness associated with chest tightness which prompted her admission,  cardiac catheterization, and stenting of her LAD.   After being discharged home yesterday she stated that she did well.  This  morning she woke up and rolled over and noticed bilateral arm numbness.  She  denied any associated chest discomfort, shortness of breath, nausea,  vomiting or diaphoresis.  She gave a 5 on a scale of 0-10.  She took two  aspirin, three sublingual nitroglycerin without any relief, thus her  presentation to the emergency room.  In the emergency room she has not  received any thing for her bilateral discomfort and it has gradually eased  off to a level of 2 on a scale of 0-10.   While during our interview, she developed pain underneath her left breast.  She stated at that point that her bilateral arm numbness had resolved.  A  repeat EKG was performed and again did not show any acute changes.   PAST MEDICAL HISTORY:  Is notable for allergy to PENICILLIN.  The patient  states that she swells with all STATINS (specifics unknown).   Current medications as of discharge include:  1.  Synthroid 200 mcg daily.  2.  Lopressor 25 b.i.d.  3.  Lantus 50 units q.h.s.  4.  Imdur, metformin  and Avandia, unknown dosages.  5.  Plavix 75 daily.  6.  Zetia 10 mg daily.  7.  Nitroglycerin 0.4 p.r.n.  8.  Aspirin 325 daily.   She has not taken her medications this morning.  On review of her  medications, she stated that she took Plavix last night and she states that  the nurse did not inform her that she had already received Plavix yesterday  prior to her discharge.  The patient did not bring her medications to the  hospital.  She does not know the doses of her Imdur, metformin, and Avandia.  These were not listed in her previous admission.  Her husband was asked to  bring in the bottles of medications for review.   Her medical history is notable for type 2 diabetes for 10 years without  associated -opathies; hypothyroidism, unknown last TSH check;  hyperlipidemia, unknown last lipid check; hypertension; remote  catheterization with specifics unknown; status post hysterectomy in 1991;  cholecystectomy; hernia repair; in addition  to findings listed in the HPI.   SOCIAL HISTORY:  She resides in [place] with her husband.  She is  employed at [employer] as an Web designer.  She denies any  alcohol, drugs or tobacco, herbal medications, specific diet, and she does  not exercise secondary to the need for a bilateral knee replacement.   Her mother is deceased at the age of 41 with liver cancer, her father at the  age of 59 with a myocardial infarction, history of hypertension.  She has a  sister age 36 with a history of diabetes and status post kidney transplant.   REVIEW OF SYSTEMS:  In addition to above is notable for glasses, bilateral  knee arthralgias, menopausal.  She denies exertional chest discomfort.   PHYSICAL EXAMINATION:  GENERAL:  Well-nourished, well-developed, obese white  female in no apparent distress, accompanied by her husband.  VITAL SIGNS  Temperature is 96.6; blood pressure initially 159/82, now  134/76; pulse is 78 and regular; respirations 22  and regular; 98% saturation  on room air.  HEENT:  Unremarkable.  NECK:  Supple without thyromegaly, adenopathy, JVD or carotid bruits.  CHEST:  Symmetrical excursion.  LUNGS:  Clear to auscultation without rales, rhonchi or wheezing.  HEART:  PMI is not displaced.  Regular rate and rhythm.  Do not appreciate  murmurs, rubs, clicks or gallops.  All pulses are symmetrical and intact  without femoral or abdominal bruits.  ABDOMEN:  Obese.  Bowel sounds present without organomegaly, masses or  tenderness.  EXTREMITIES:  Negative cyanosis, clubbing or edema.  Peripheral pulses  symmetrical and intact.  Right catheterization site does show some slight  ecchymosis.  Dressing is still in place.  MUSCULOSKELETAL:  Essentially unremarkable, as well as neurologic.   Chest x-ray is pending.  Initial EKG showed normal sinus rhythm, left axis  deviation, normal intervals, right bundle-branch block without any changes  from EKG on Oct 05, 2005.  Repeat EKG in the emergency room associated chest  discomfort again did not show any changes.  An i-STAT in the ER showed H&H  of 15.3 and 45.0, sodium 137, potassium 4.2, BUN 14, creatinine 0.8, glucose  154.  CK-MB was unremarkable.  Troponin slightly elevated 0.13.   IMPRESSION:  1.  Bilateral arm numbness, uncertain etiology; somewhat atypical chest      discomfort.  2.  Anxiety.  3.  Hypertension.  4.  Obesity.  5.  Status post recent stenting of the left anterior descending artery as      previously described.   DISPOSITION:  Dr. Stanford Breed reviewed the patient's history, spoke with and  examined the patient, and agrees with the above.  Dr. Stanford Breed feels that  her symptoms are somewhat conservative.  We will admit her, place her on IV  nitroglycerin, nitroglycerin, aspirin and Plavix, and continue her home  medications.  Her husband was asked to bring in the bottles for review.  We will proceed with re-look catheterization with Dr. Lia Foyer.  The  risks,  benefits  and procedure were discussed with the patient and she agrees to proceed as  planned.  We will continue to cycle enzymes.  If her catheterization does  not show any new stenoses, consideration should be given to referring her  back to her primary care physician to determine etiology of her bilateral  arm paresthesias.      Sharyl Nimrod, P.A. [facility]    ______________________________  Kirk Ruths, M.D. [facility]    EW/MEDQ  D:  10/06/2005  T:  10/06/2005  Job:  [id number]   cc:   [name], M.D.  Fax: [place] Lia Foyer, M.D. [facility]  [address] [place]  [place] [address]

## 2010-10-01 NOTE — Assessment & Plan Note (Signed)
[facility]                              CARDIOLOGY OFFICE NOTE   NAME:Juniel, CYAN CLIPPINGER                       MRN:          [id number]  DATE:03/02/2006                            DOB:          1935/02/23    [name] is in for followup.  She is stable.  She has not been having any  ongoing symptoms.  She feels well.  Her biggest problem has been her knees.  She wants to probably have something done with her knees.  She saw Dr.  [name] the other day, and she is continuing on her current medical regimen.  Unfortunately, she had previously tried Actos and could not tolerate it.  She has been able to tolerate Avandia, and despite the known data regarding  the cardiac situation, the Avandia has been successful in bringing her  hemoglobin A1c down from the 11 range to 6.6.  It is therefore my  understanding that the drug has been continued.   PHYSICAL EXAMINATION:  VITAL SIGNS:  The blood pressure is 124/70, the pulse  is 68.  LUNGS:  The lung fields are clear.  CARDIAC:  The cardiac rhythm is regular.   The EKG reveals normal sinus rhythm with a right bundle morphology.   Recent lab work is remarkable for a BUN of 21, a creatinine of 0.8.  Her LDL  is 101 on Zetia 10 mg.  She cannot tolerate statins.  Hemoglobin is 12.3 and  hematocrit 36.9, with a platelet count of 280,000 and an A1c of 6.6.   Fortunately, she is continuing to get along well.  She is interested in  possibly having her knees done.  I told her that we should hold off for at  least a year after the implantation of a drug-eluting stent.  Finally, the  patient has had a small right upper lobe lung nodule and a followup CT scan  was suggested, and this will be accomplished shortly.  We will follow up  with her in 4 months.       Loretha Brasil. Lia Foyer, MD, [facility]     TDS/MedQ  DD:  03/02/2006  DT:  03/04/2006  Job #:  [id number]

## 2010-10-01 NOTE — Discharge Summary (Signed)
[name], [name] NO.:  [id number]   MEDICAL RECORD NO.:  [id number]          PATIENT TYPE:  INP   LOCATION:  6531                         FACILITY:  [facility]   PHYSICIAN:  Eustace Quail, M.D. [facility] DATE OF BIRTH:  [birth year]   DATE OF ADMISSION:  10/04/2005  DATE OF DISCHARGE:  10/05/2005                           DISCHARGE SUMMARY - REFERRING   DISCHARGE DIAGNOSES:  ACS, status post drug-eluting stenting to the left  anterior descending, as previously described.   PROCEDURES PERFORMED:  Cardiac catheterization on Oct 04, 2005 with stenting  to LAD by Dr. Olevia Perches.   SUMMARY OF HISTORY:  Tonya Hunter is a 76 year old female who on the morning of  admission at approximately 3:00 a.m. developed substernal chest discomfort  for which she took nitroglycerin with relief.  She also had some  paresthesias in the upper extremities for the preceding week.   The patient states that she has had catheterization in the past with a  history of 5 blockages but without previous angioplasty or stenting.  Specifics were not available.  Her history is also notable for diabetes,  hyperlipidemia, hypothyroidism.   LABORATORY DATA:  Admission H&H 14.6 and 43.0, normal indices, platelets  317,000, WBCs 8.9.  Prior to discharge H&H was 12.2 and 35.9, normal  indices, platelets 273,000,  WBC 6.4.  Admission PTT 41, PT 12.8, sodium  131, potassium 4.1, BUN 22, creatinine 0.6, glucose 141.  Prior to  discharge, BUN, creatinine was 14 and 0.9, potassium 32.9.  CK-MB post  procedure was unremarkable.  Chest x-ray on admission did not show any edema  or focal infiltrate.  There was a subtle nodular opacity in the right mid  lung suspicious for super-imposition of shadows.  EKG showed sinus rhythm,  normal axis, right bundle branch block, nonspecific ST-T wave changes.   HOSPITAL COURSE:  Tonya Hunter was taken to the cath lab on Oct 04, 2005 by Dr.  Olevia Perches.  According to Dr. Nichola Sizer note, she had  a mid 40% RCA with distal  irregularities, s 40% diagonal one, 90% proximal LAD, distal circumflex of  90%, EF of 70%.  After review, Dr. Olevia Perches performed drug-eluting stent to  the proximal LAD reducing this from 90% to 0%.  Post-procedure  catheterization site was intact.  Case management assisted with discharge  needs.  After Dr. Olevia Perches assessed on Oct 05, 2005, it was felt that she  could be discharged home.  He recommend Plavix long-term and Zetia was added  for her history of hyperlipidemia.  He recommended that she stay out of work  until next week.   HISTORY:  As noted above or below.   DISPOSITION:  The patient was asked to maintain low salt/fat/cholesterol ADA  diet.  Her activity and wound care is as per the supplemental discharge  sheet.  She was asked to bring all her medications to all appointments.   New prescriptions were written for:  1.  Plavix 75 mg q.d.  2.  Zetia 10 mg q.d.  3.  Nitroglycerin 0.4 as needed were written.   She was asked to  begin aspirin 325 mg q.d.   Continue her:  1.  Synthroid 200 mcg q.d.  2.  Lopressor 25 b.i.d.  3.  Lantus 500 units q.h.s.  4.  Imdur, as previously.  5.  Metformin, as previously.  6.  Avandia as previously.   She will follow up with Dr. Lia Foyer on November 01, 2005 at 4:15, and she was  asked to follow up with Dr. [name].  Consideration should be given to  discontinuing Avandia given recent studies.      Tonya Hunter, P.A. [facility]    ______________________________  Eustace Quail, M.D. [facility]    EW/MEDQ  D:  10/05/2005  T:  10/05/2005  Job:  [id number]   cc:   Dr. Queen Blossom D. Lia Foyer, M.D. [facility]  [address] [place]  [place] [address]

## 2010-10-01 NOTE — Discharge Summary (Signed)
[place]  Patient:    Tonya Hunter, Tonya Hunter Visit Number: [id number] MRN: [id number]          Service Type: MED Location: [phone] Attending Physician:  [name] Cleverly Dictated by:   Earnstine Regal, M.D. Admit Date:  10/01/2001 Discharge Date: 10/03/2001   CC:         [name]. [name], M.D.   Discharge Summary  REASON FOR ADMISSION:  Biliary colic.  HISTORY OF PRESENT ILLNESS:  Patient is a 76 year old white female who presents with a several-month history of right upper quadrant and right chest pain radiating to the back associated with nausea.  Patient was seen and evaluated in the emergency room.  Ultrasound showed solitary gallstone without biliary dilatation.  Chest x-ray was unrevealing.  Patient was admitted for pain control.  She was seen by Dr. [name] for her diabetes management.  She was prepared for the operating room.  HOSPITAL COURSE:  Patient was admitted on May 19.  She was prepared and taken to the operating room on the morning of Oct 02, 2001.  She underwent laparoscopic cholecystectomy with intraoperative cholangiography. Postoperatively, the patient did very well.  Initially, she had difficulty with pain control, nausea, and vomiting.  However, this resolved over the ensuing 24 hours.  She was advanced from clear liquids to a regular diet.  She became ambulatory.  She was prepared for discharge home on the evening of 5x 21, 2003.  DISCHARGE PLANNING:  Patient is discharged home Oct 03, 2001, in good condition, tolerating a regular diet, and ambulating independently.  Patient will be seen back in my office at [facility] in two to three weeks.  DISCHARGE MEDICATIONS:  Vicodin as needed for pain and Milk of Magnesia as needed for constipation.  FINAL DIAGNOSIS:  Chronic cholecystitis, cholelithiasis.  CONDITION AT DISCHARGE:  Improved. Dictated by:   Earnstine Regal, M.D. Attending Physician:   [name] Cleverly DD:  10/25/01 TD:  10/28/01 Job: [id number]

## 2010-10-01 NOTE — Cardiovascular Report (Signed)
NAMEMarland Kitchen  LYNDA, WANNINGER NO.:  [id number]   MEDICAL RECORD NO.:  [id number]          PATIENT TYPE:  INP   LOCATION:  2005                         FACILITY:  [facility]   PHYSICIAN:  Loretha Brasil. Lia Foyer, M.D. [facility] OF BIRTH:  17-Feb-1935   DATE OF PROCEDURE:  10/06/2005  DATE OF DISCHARGE:                              CARDIAC CATHETERIZATION   INDICATIONS:  Ms. Eriksen is a delightful 76 year old who is well known to me.  She underwent stenting of the left anterior descending artery with a Taxus  drug-eluting stent by Dr. Olevia Perches.  There was some difficulty with passing  the stent due to calcification in the vessel.  She has now had some chest  discomfort, and the quality of the discomfort is that it is slightly worse  with inspiration and also into the back.  The current study was done to  assess coronary anatomy.   PROCEDURE:  Selective coronary arteriography.   DESCRIPTION OF PROCEDURE:  The procedure was performed through the left  femoral artery using a 4-French sheath and 4-French catheters.  She  tolerated the procedure without complication and was taken into the holding  area in satisfactory clinical condition.   HEMODYNAMIC DATA.:  Central aortic pressure 129/66, mean 93.   ANGIOGRAPHIC DATA:  1.  The left main is free of critical disease.  2.  The left anterior descending artery is a moderately calcified vessel.      There is about a 40% area of eccentric plaquing just prior to the      stented site which is just after the diagonal.  Distal to the stent      site, there is some mild luminal irregularity.  Proximal diagonal branch      has about 40-50% narrowing in its ostium as well, although it does not      appear to be critical.  The LAD appears to be slightly worse in the LAO      cranial view, but it does not appear to be any different than at the      time of the procedure itself and I would grade it at no more than 50% as      noted.  3.  The circumflex  provides a large first marginal branch whose terminal      branches have subtotal occlusions of 90%.  This is similar to what she      had previously.  It is fairly distal.  The second marginal branch is      large in caliber and free of critical disease.  4.  The right coronary artery also has diffuse luminal irregularity with      some calcification and about 30-40% narrowing distally prior to the PDA      takeoff.  This is eccentric.   CONCLUSIONS:  1.  The stent to the left anterior descending artery continues to      demonstrate patency.  There is mild irregularity along the proximal      aspect of the stent at the completion of the procedure, but it does not  appear to be flow limiting and that was a consensus opinion.  There is      brisk flow down the LAD, and it does not appear to be significantly      diseased in this location.  2.  Severe disease of the circumflex marginal as previously noted.   DISPOSITION:  The patient's symptoms are somewhat atypical.  Her blood  pressure is under control in the catheterization laboratory.  We will check  a D-dimer, and we may potentially get a CT scan to rule out other potential  causes of her current symptoms which seem to be more pleuritic in nature.      Loretha Brasil. Lia Foyer, M.D. [facility]  Electronically Signed     TDS/MEDQ  D:  10/06/2005  T:  10/07/2005  Job:  [id number]   cc:   CV Laboratory   Shanna Cisco., M.D.  [address] [place]  [place] [address]   Eustace Quail, M.D. [facility]  [address] [place]  [place] [address]

## 2010-10-04 ENCOUNTER — Ambulatory Visit (HOSPITAL_COMMUNITY): Payer: Self-pay

## 2010-10-04 ENCOUNTER — Encounter (HOSPITAL_COMMUNITY): Payer: Medicare Other

## 2010-10-06 ENCOUNTER — Ambulatory Visit (HOSPITAL_COMMUNITY): Payer: Self-pay

## 2010-10-06 ENCOUNTER — Encounter (HOSPITAL_COMMUNITY): Payer: Medicare Other

## 2010-10-08 ENCOUNTER — Ambulatory Visit (HOSPITAL_COMMUNITY): Payer: Self-pay

## 2010-10-08 ENCOUNTER — Encounter (HOSPITAL_COMMUNITY): Payer: Medicare Other

## 2010-12-16 ENCOUNTER — Encounter: Payer: Self-pay | Admitting: Cardiology

## 2010-12-27 ENCOUNTER — Other Ambulatory Visit: Payer: Self-pay | Admitting: Cardiology

## 2010-12-29 MED ORDER — METOPROLOL TARTRATE 50 MG PO TABS: ORAL_TABLET | ORAL | Status: DC

## 2010-12-29 NOTE — Telephone Encounter (Signed)
Pt out of metoprolol and needs refill, was requested 8-13

## 2010-12-31 ENCOUNTER — Other Ambulatory Visit: Payer: Self-pay | Admitting: *Deleted

## 2011-02-09 ENCOUNTER — Other Ambulatory Visit: Payer: Self-pay | Admitting: Dermatology

## 2011-02-14 LAB — BASIC METABOLIC PANEL
BUN: 11
CO2: 28
Calcium: 9.3
Chloride: 103
Creatinine, Ser: 0.64
Creatinine, Ser: 0.76
GFR calc Af Amer: >60

## 2011-02-14 LAB — GLUCOSE, CAPILLARY: Glucose-Capillary: 151 — ABNORMAL HIGH

## 2011-02-14 LAB — POCT HEMOGLOBIN-HEMACUE: Hemoglobin: 13.5

## 2011-02-22 LAB — I-STAT 8, (EC8 V) (CONVERTED LAB)
BUN: 6
Chloride: 105
HCT: 46
Hemoglobin: 15.6 — ABNORMAL HIGH
Operator id: 198171
Potassium: 4.1
Sodium: 140

## 2011-02-22 LAB — POCT I-STAT CREATININE
Creatinine, Ser: 0.6
Operator id: 198171

## 2011-02-23 ENCOUNTER — Ambulatory Visit (INDEPENDENT_AMBULATORY_CARE_PROVIDER_SITE_OTHER): Payer: Medicare Other | Admitting: Cardiology

## 2011-02-23 ENCOUNTER — Encounter: Payer: Self-pay | Admitting: Cardiology

## 2011-02-23 DIAGNOSIS — E785 Hyperlipidemia, unspecified: Secondary | ICD-10-CM

## 2011-02-23 DIAGNOSIS — I10 Essential (primary) hypertension: Secondary | ICD-10-CM

## 2011-02-23 DIAGNOSIS — I2510 Atherosclerotic heart disease of native coronary artery without angina pectoris: Secondary | ICD-10-CM

## 2011-02-23 NOTE — Assessment & Plan Note (Signed)
No recurrent symptoms.  We discussed DAPT.  Would recommend continued use of DAPT.  Had AMI with withdrawal for ortho surgery

## 2011-02-23 NOTE — Assessment & Plan Note (Signed)
BP is well controlled 

## 2011-02-23 NOTE — Patient Instructions (Signed)
Your physician recommends that you return for a FASTING lipid, liver and BMP---nothing to eat or drink after midnight, lab opens at 8:30  Your physician recommends that you continue on your current medications as directed. Please refer to the Current Medication list given to you today.  Your physician wants you to follow-up in: 6 MONTHS.  You will receive a reminder letter in the mail two months in advance. If you don't receive a letter, please call our office to schedule the follow-up appointment.

## 2011-02-23 NOTE — Assessment & Plan Note (Signed)
Needs lipid and liver.

## 2011-02-23 NOTE — Progress Notes (Signed)
HPI:  She is doing pretty well.  Both knees are functioning without trouble.  Denies any chest pain.  No progressive shortness of breath.  She is now on Lantus, and has many questions which we tried to answer today.    Current Outpatient Prescriptions  Medication Sig Dispense Refill  . aspirin 81 MG EC tablet Take 1 tablet (81 mg total) by mouth daily.      Marland Kitchen ezetimibe-simvastatin (VYTORIN) 10-20 MG per tablet Take 1 tablet by mouth at bedtime.        . furosemide (LASIX) 40 MG tablet Take 40 mg by mouth daily. TAKE 1/2 (HALF) TAB QD       . insulin glargine (LANTUS) 100 UNIT/ML injection Inject 80 Units into the skin at bedtime.       . insulin lispro (HUMALOG) 100 UNIT/ML injection Inject 10 Units into the skin 3 (three) times daily before meals.        Marland Kitchen levothyroxine (SYNTHROID, LEVOTHROID) 200 MCG tablet Take 200 mcg by mouth daily.        . metoprolol (LOPRESSOR) 50 MG tablet 50 mg 2 (two) times daily.        . nitroGLYCERIN (NITROSTAT) 0.4 MG SL tablet Place 0.4 mg under the tongue every 5 (five) minutes as needed.        Marland Kitchen PLAVIX 75 MG tablet TAKE 1 TABLET EVERY DAY  90 tablet  3  . potassium chloride (KLOR-CON) 10 MEQ CR tablet Take 1 tablet (10 mEq total) by mouth 2 (two) times daily.  180 tablet  3    Allergies  Allergen Reactions  . Meperidine Hcl   . Penicillins     Past Medical History  Diagnosis Date  . Coronary artery disease 09/2005    s/p TAXUS DRUG-ELUTING STENT PLACEMENT TO THE LEFT ANTERIOR DESCENDING ARTERY  . Hypertension   . Diabetes mellitus     TYPE II  . Hyperlipidemia   . Osteoarthrosis, unspecified whether generalized or localized, unspecified site   . Thyroid disease     HYPOTHYROIDISM  . History of hysterectomy     Past Surgical History  Procedure Date  . Median nerve repair 2009    DECOMPRESSION...RIGHT AND LEFT DECOMPRESSION  . Appendectomy   . Cataract extraction   . Cholecystectomy   . Hernia repair   . Knee surgery   . Coronary stent  placement 09/2005    PLACEMENT TO LEFT ANTERIOR DESCENDING ARTERY    Family History  Problem Relation Age of Onset  . Diabetes    . Hypertension      History   Social History  . Marital Status: Married    Spouse Name: N/A    Number of Children: N/A  . 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 Smoking status: Never Smoker   . Smokeless tobacco: Not on file  . Alcohol Use: No  . Drug Use: No  . Sexually Active:    Other Topics Concern  . Not on file   Social History Narrative  . No narrative on file    ROS: Please see the HPI.  All other systems reviewed and negative.  PHYSICAL EXAM:  BP 138/70  Pulse 60  Resp 18  Ht 5' 6"  (1.676 m)  Wt 212 lb 12.8 oz (96.525 kg)  BMI 34.35 kg/m2  General: Well developed, well nourished, in no acute distress. Head:  Normocephalic and atraumatic. Neck: no  JVD Lungs: Clear to auscultation and percussion. Heart: Normal S1 and S2.  No murmur, rubs or gallops.  Abdomen:  Normal bowel sounds; soft; non tender; no organomegaly Pulses: Pulses normal in all 4 extremities. Extremities: No clubbing or cyanosis. Trace edema Neurologic: Alert and oriented x 3.  EKG:  NSR.  RBBB.  Cannot exclude old ASMI.    ASSESSMENT AND PLAN:

## 2011-02-24 ENCOUNTER — Other Ambulatory Visit (INDEPENDENT_AMBULATORY_CARE_PROVIDER_SITE_OTHER): Payer: Medicare Other | Admitting: *Deleted

## 2011-02-24 DIAGNOSIS — I10 Essential (primary) hypertension: Secondary | ICD-10-CM

## 2011-02-24 DIAGNOSIS — I2510 Atherosclerotic heart disease of native coronary artery without angina pectoris: Secondary | ICD-10-CM

## 2011-02-24 DIAGNOSIS — E785 Hyperlipidemia, unspecified: Secondary | ICD-10-CM

## 2011-02-24 LAB — HEPATIC FUNCTION PANEL
ALT: 27 U/L (ref 0–35)
AST: 34 U/L (ref 0–37)
Bilirubin, Direct: 0.2 mg/dL (ref 0.0–0.3)
Total Bilirubin: 0.9 mg/dL (ref 0.3–1.2)

## 2011-02-24 LAB — BASIC METABOLIC PANEL
BUN: 14 mg/dL (ref 6–23)
CO2: 28 mEq/L (ref 19–32)
Calcium: 9.4 mg/dL (ref 8.4–10.5)
GFR: 89.8 mL/min (ref >60.00)
Glucose, Bld: 218 mg/dL — ABNORMAL HIGH (ref 70–99)
Potassium: 4 mEq/L (ref 3.5–5.1)

## 2011-02-25 LAB — BASIC METABOLIC PANEL
BUN: 11
BUN: 8
CO2: 25
Calcium: 8.6
Calcium: 9.2
Chloride: 101
Chloride: 102
Chloride: 103
Chloride: 103
Chloride: 105
Creatinine, Ser: 0.77
Creatinine, Ser: 0.77
Creatinine, Ser: 0.8
GFR calc Af Amer: >60
GFR calc Af Amer: >60
GFR calc Af Amer: >60
GFR calc Af Amer: >60
GFR calc non Af Amer: >60
GFR calc non Af Amer: >60
GFR calc non Af Amer: >60
GFR calc non Af Amer: >60
Glucose, Bld: 85
Potassium: 3.9
Potassium: 3.4 — ABNORMAL LOW
Potassium: 3.5
Potassium: 4.3
Sodium: 137
Sodium: 137
Sodium: 139
Sodium: 140

## 2011-02-25 LAB — TYPE AND SCREEN
ABO/RH(D): O POS
Antibody Screen: NEGATIVE

## 2011-02-25 LAB — URINE MICROSCOPIC-ADD ON

## 2011-02-25 LAB — CBC
HCT: 33.5 — ABNORMAL LOW
HCT: 34 — ABNORMAL LOW
HCT: 34.1 — ABNORMAL LOW
Hemoglobin: 11.3 — ABNORMAL LOW
Hemoglobin: 10.2 — ABNORMAL LOW
Hemoglobin: 10.2 — ABNORMAL LOW
Hemoglobin: 11.6 — ABNORMAL LOW
Hemoglobin: 11.6 — ABNORMAL LOW
MCHC: 33.9
MCHC: 34.4
MCV: 89.1
MCV: 89.2
MCV: 88.6
MCV: 89.8
Platelets: 517 — ABNORMAL HIGH
Platelets: 182
RBC: 3.76 — ABNORMAL LOW
RBC: 3.25 — ABNORMAL LOW
RBC: 3.36 — ABNORMAL LOW
RBC: 3.38 — ABNORMAL LOW
RBC: 3.78 — ABNORMAL LOW
RBC: 3.88
RDW: 13.6
RDW: 13.2
WBC: 8.1
WBC: 8.6
WBC: 6.3
WBC: 7.3
WBC: 9

## 2011-02-25 LAB — DIFFERENTIAL
Basophils Relative: 1
Eosinophils Absolute: 0.3
Eosinophils Relative: 3
Eosinophils Relative: 4
Lymphocytes Relative: 25
Lymphs Abs: 1.6
Monocytes Absolute: 0.4
Monocytes Absolute: 0.6
Monocytes Relative: 7
Monocytes Relative: 9
Neutro Abs: 3.4

## 2011-02-25 LAB — URINE CULTURE

## 2011-02-25 LAB — PROTIME-INR
INR: 1.8 — ABNORMAL HIGH
INR: 1.1
INR: 1.3
INR: 1.3
INR: 1.5
Prothrombin Time: 14.7
Prothrombin Time: 16 — ABNORMAL HIGH
Prothrombin Time: 16.5 — ABNORMAL HIGH
Prothrombin Time: 18 — ABNORMAL HIGH

## 2011-02-25 LAB — CK TOTAL AND CKMB (NOT AT ARMC): CK, MB: 1.5

## 2011-02-25 LAB — URINALYSIS, ROUTINE W REFLEX MICROSCOPIC
Glucose, UA: NEGATIVE
Hgb urine dipstick: NEGATIVE
Protein, ur: NEGATIVE
Specific Gravity, Urine: 1.01
Urobilinogen, UA: 2 — ABNORMAL HIGH

## 2011-02-25 LAB — CARDIAC PANEL(CRET KIN+CKTOT+MB+TROPI)
CK, MB: 1.8
Troponin I: 0.03

## 2011-02-25 LAB — POCT CARDIAC MARKERS
CKMB, poc: <1 — ABNORMAL LOW
Operator id: 4534
Troponin i, poc: <0.05

## 2011-02-25 LAB — TROPONIN I: Troponin I: 0.02

## 2011-02-28 LAB — URINALYSIS, ROUTINE W REFLEX MICROSCOPIC
Nitrite: NEGATIVE
Specific Gravity, Urine: 1.025
Urobilinogen, UA: 0.2

## 2011-02-28 LAB — COMPREHENSIVE METABOLIC PANEL
ALT: 20
AST: 30
CO2: 27
Calcium: 9.6
Chloride: 105
GFR calc Af Amer: >60
GFR calc non Af Amer: >60
Sodium: 140
Total Bilirubin: 0.5

## 2011-02-28 LAB — CBC
RBC: 4.21
WBC: 6.8

## 2011-03-03 LAB — I-STAT 8, (EC8 V) (CONVERTED LAB)
BUN: 16
Bicarbonate: 24.5 — ABNORMAL HIGH
Glucose, Bld: 109 — ABNORMAL HIGH
Operator id: 277751
Sodium: 141
TCO2: 26
pCO2, Ven: 39.5 — ABNORMAL LOW

## 2011-03-03 LAB — CBC
HCT: 35.6 — ABNORMAL LOW
MCV: 90.2
RBC: 3.95
WBC: 8.4

## 2011-03-03 LAB — POCT CARDIAC MARKERS
CKMB, poc: 1.1
CKMB, poc: 1.3
Operator id: 277751
Troponin i, poc: <0.05
Troponin i, poc: <0.05

## 2011-03-03 LAB — DIFFERENTIAL
Eosinophils Absolute: 0.1
Eosinophils Relative: 1
Lymphocytes Relative: 28
Lymphs Abs: 2.4
Monocytes Relative: 8

## 2011-03-03 LAB — POCT I-STAT CREATININE: Operator id: 277751

## 2011-03-08 ENCOUNTER — Telehealth: Payer: Self-pay | Admitting: Cardiology

## 2011-03-08 NOTE — Telephone Encounter (Signed)
Walk In Pt Form " Pt Dropped off Hand Written Note" sent to Message Nurse  03/08/11/km

## 2011-03-17 ENCOUNTER — Telehealth: Payer: Self-pay | Admitting: Cardiology

## 2011-03-17 NOTE — Telephone Encounter (Signed)
Pt was returning your call about test results

## 2011-03-17 NOTE — Telephone Encounter (Signed)
Pt calling back again about test results

## 2011-03-21 ENCOUNTER — Telehealth: Payer: Self-pay | Admitting: Cardiology

## 2011-03-21 NOTE — Telephone Encounter (Signed)
Pt rtn call, Pt calling back very anxious to get test results, can be reached until 950a

## 2011-03-21 NOTE — Telephone Encounter (Signed)
Called patient with lab results and Dr.Stuckey's MD recommendations.

## 2011-05-23 ENCOUNTER — Other Ambulatory Visit: Payer: Self-pay | Admitting: Cardiology

## 2011-05-31 DIAGNOSIS — F341 Dysthymic disorder: Secondary | ICD-10-CM | POA: Diagnosis not present

## 2011-05-31 DIAGNOSIS — I259 Chronic ischemic heart disease, unspecified: Secondary | ICD-10-CM | POA: Diagnosis not present

## 2011-05-31 DIAGNOSIS — I10 Essential (primary) hypertension: Secondary | ICD-10-CM | POA: Diagnosis not present

## 2011-05-31 DIAGNOSIS — E1159 Type 2 diabetes mellitus with other circulatory complications: Secondary | ICD-10-CM | POA: Diagnosis not present

## 2011-07-28 ENCOUNTER — Other Ambulatory Visit: Payer: Self-pay | Admitting: Cardiology

## 2011-07-28 DIAGNOSIS — E1159 Type 2 diabetes mellitus with other circulatory complications: Secondary | ICD-10-CM | POA: Diagnosis not present

## 2011-07-28 DIAGNOSIS — I10 Essential (primary) hypertension: Secondary | ICD-10-CM | POA: Diagnosis not present

## 2011-07-28 DIAGNOSIS — F341 Dysthymic disorder: Secondary | ICD-10-CM | POA: Diagnosis not present

## 2011-07-28 DIAGNOSIS — I259 Chronic ischemic heart disease, unspecified: Secondary | ICD-10-CM | POA: Diagnosis not present

## 2011-07-28 MED ORDER — METOPROLOL TARTRATE 50 MG PO TABS: 50.0000 mg | ORAL_TABLET | Freq: Two times a day (BID) | ORAL | Status: DC

## 2011-07-28 NOTE — Telephone Encounter (Signed)
FU Call: Pt calling to check on status of refill. Pt stated she is out of RX and needs refill called in ASAP.

## 2011-07-28 NOTE — Telephone Encounter (Signed)
Refilled medication and spoke with patient

## 2011-08-22 ENCOUNTER — Ambulatory Visit (INDEPENDENT_AMBULATORY_CARE_PROVIDER_SITE_OTHER): Payer: Medicare Other | Admitting: Cardiology

## 2011-08-22 ENCOUNTER — Encounter: Payer: Self-pay | Admitting: Cardiology

## 2011-08-22 VITALS — BP 120/72 | HR 60 | Ht 66.0 in | Wt 220.4 lb

## 2011-08-22 DIAGNOSIS — E785 Hyperlipidemia, unspecified: Secondary | ICD-10-CM

## 2011-08-22 DIAGNOSIS — I2510 Atherosclerotic heart disease of native coronary artery without angina pectoris: Secondary | ICD-10-CM | POA: Diagnosis not present

## 2011-08-22 DIAGNOSIS — I10 Essential (primary) hypertension: Secondary | ICD-10-CM

## 2011-08-22 NOTE — Progress Notes (Signed)
HPI:  She is doing pretty well.  She denies chest pain.  Her biggest issue at present is her insulin use and dosing.  She relates her weight gain to the change in meds, but she is understanding that there are issues with this.  Denies cardiac symptoms at all.   Current Outpatient Prescriptions  Medication Sig Dispense Refill  . aspirin 81 MG EC tablet Take 1 tablet (81 mg total) by mouth daily.      Marland Kitchen ezetimibe-simvastatin (VYTORIN) 10-20 MG per tablet Take 1 tablet by mouth at bedtime.        . furosemide (LASIX) 40 MG tablet Take 40 mg by mouth daily. TAKE 1/2 (HALF) TAB QD       . insulin glargine (LANTUS) 100 UNIT/ML injection Inject 80 Units into the skin at bedtime.       . insulin lispro (HUMALOG) 100 UNIT/ML injection Inject 12 Units into the skin 3 (three) times daily before meals.       Marland Kitchen levothyroxine (SYNTHROID, LEVOTHROID) 200 MCG tablet Take 200 mcg by mouth daily.        . metoprolol (LOPRESSOR) 50 MG tablet Take 1 tablet (50 mg total) by mouth 2 (two) times daily.  180 tablet  2  . nitroGLYCERIN (NITROSTAT) 0.4 MG SL tablet TAKE 1 TABLET UNDER TONGUE AT ONSET OF CHEST PAIN; MAY REPEAT IN 5 MIN FOR UP TO 3 DOSES.  25 tablet  2  . PLAVIX 75 MG tablet TAKE 1 TABLET EVERY DAY  90 tablet  3  . potassium chloride (KLOR-CON) 10 MEQ CR tablet Take 1 tablet (10 mEq total) by mouth 2 (two) times daily.  180 tablet  3    Allergies  Allergen Reactions  . Meperidine Hcl   . Penicillins     Past Medical History  Diagnosis Date  . Coronary artery disease 09/2005    s/p TAXUS DRUG-ELUTING STENT PLACEMENT TO THE LEFT ANTERIOR DESCENDING ARTERY  . Hypertension   . Diabetes mellitus     TYPE II  . Hyperlipidemia   . Osteoarthrosis, unspecified whether generalized or localized, unspecified site   . Thyroid disease     HYPOTHYROIDISM  . History of hysterectomy     Past Surgical History  Procedure Date  . Median nerve repair 2009    DECOMPRESSION...RIGHT AND LEFT DECOMPRESSION  .  Appendectomy   . Cataract extraction   . Cholecystectomy   . Hernia repair   . Knee surgery   . Coronary stent placement 09/2005    PLACEMENT TO LEFT ANTERIOR DESCENDING ARTERY    Family History  Problem Relation Age of Onset  . Diabetes    . Hypertension      History   Social History  . Marital Status: Married    Spouse Name: N/A    Number of Children: N/A  . 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 Smoking status: Never Smoker   . Smokeless tobacco: Not on file  . Alcohol Use: No  . Drug Use: No  . Sexually Active:    Other Topics Concern  . Not on file   Social History Narrative  . No narrative on file    ROS: Please see the HPI.  All other systems reviewed and negative.  PHYSICAL EXAM:  BP 120/72  Pulse 60  Ht 5' 6"  (1.676 m)  Wt 220 lb 6.4 oz (99.973 kg)  BMI 35.57  kg/m2  General: Well developed, well nourished, overweight female in no acute distress. Head:  Normocephalic and atraumatic. Neck: no JVD Lungs: Clear to auscultation and percussion. Heart: Normal S1 and S2.  No murmur, rubs or gallops.  Pulses: Pulses normal in all 4 extremities. Extremities: No clubbing or cyanosis. No edema. Neurologic: Alert and oriented x 3.  EKG:  NSR.  RBBB. T inversion in III, AVF unchanged.  V2 and V3 likely due to lead placement. And underlying bundle.   ASSESSMENT AND PLAN:

## 2011-08-22 NOTE — Assessment & Plan Note (Signed)
Well controlled 

## 2011-08-22 NOTE — Assessment & Plan Note (Signed)
No symptoms.  Will keep on DAPT, and see back again in about six months.

## 2011-08-22 NOTE — Patient Instructions (Signed)
Your physician wants you to follow-up in: 6 MONTHS.  You will receive a reminder letter in the mail two months in advance. If you don't receive a letter, please call our office to schedule the follow-up appointment.  Your physician recommends that you continue on your current medications as directed. Please refer to the Current Medication list given to you today.

## 2011-08-22 NOTE — Assessment & Plan Note (Signed)
Defer management to Dr. [name] in concert with DM management.

## 2011-08-26 ENCOUNTER — Other Ambulatory Visit: Payer: Self-pay | Admitting: Cardiology

## 2011-08-26 MED ORDER — CLOPIDOGREL BISULFATE 75 MG PO TABS: 75.0000 mg | ORAL_TABLET | Freq: Every day | ORAL | Status: DC

## 2011-08-26 NOTE — Telephone Encounter (Signed)
Rx Refill - Clopidogrel (PLAVIX) 75 MG tablet   Patient verified pharmacy for this refill as [facility] on [address] ([place], [place]).  Patient requesting 90 day supply, she can be contacted at hm# for additional information [phone].

## 2011-09-02 DIAGNOSIS — I259 Chronic ischemic heart disease, unspecified: Secondary | ICD-10-CM | POA: Diagnosis not present

## 2011-09-02 DIAGNOSIS — E669 Obesity, unspecified: Secondary | ICD-10-CM | POA: Diagnosis not present

## 2011-09-02 DIAGNOSIS — I10 Essential (primary) hypertension: Secondary | ICD-10-CM | POA: Diagnosis not present

## 2011-09-02 DIAGNOSIS — E1159 Type 2 diabetes mellitus with other circulatory complications: Secondary | ICD-10-CM | POA: Diagnosis not present

## 2011-09-06 DIAGNOSIS — E1139 Type 2 diabetes mellitus with other diabetic ophthalmic complication: Secondary | ICD-10-CM | POA: Diagnosis not present

## 2011-09-06 DIAGNOSIS — H43819 Vitreous degeneration, unspecified eye: Secondary | ICD-10-CM | POA: Diagnosis not present

## 2011-09-06 DIAGNOSIS — E11339 Type 2 diabetes mellitus with moderate nonproliferative diabetic retinopathy without macular edema: Secondary | ICD-10-CM | POA: Diagnosis not present

## 2011-09-06 DIAGNOSIS — E1165 Type 2 diabetes mellitus with hyperglycemia: Secondary | ICD-10-CM | POA: Diagnosis not present

## 2011-09-06 DIAGNOSIS — H35049 Retinal micro-aneurysms, unspecified, unspecified eye: Secondary | ICD-10-CM | POA: Diagnosis not present

## 2011-10-14 DIAGNOSIS — L570 Actinic keratosis: Secondary | ICD-10-CM | POA: Diagnosis not present

## 2011-10-14 DIAGNOSIS — L723 Sebaceous cyst: Secondary | ICD-10-CM | POA: Diagnosis not present

## 2011-10-14 DIAGNOSIS — L821 Other seborrheic keratosis: Secondary | ICD-10-CM | POA: Diagnosis not present

## 2011-11-24 DIAGNOSIS — E1159 Type 2 diabetes mellitus with other circulatory complications: Secondary | ICD-10-CM | POA: Diagnosis not present

## 2011-11-24 DIAGNOSIS — I259 Chronic ischemic heart disease, unspecified: Secondary | ICD-10-CM | POA: Diagnosis not present

## 2011-11-24 DIAGNOSIS — I10 Essential (primary) hypertension: Secondary | ICD-10-CM | POA: Diagnosis not present

## 2011-11-24 DIAGNOSIS — E782 Mixed hyperlipidemia: Secondary | ICD-10-CM | POA: Diagnosis not present

## 2012-02-08 DIAGNOSIS — E039 Hypothyroidism, unspecified: Secondary | ICD-10-CM | POA: Diagnosis not present

## 2012-02-08 DIAGNOSIS — I10 Essential (primary) hypertension: Secondary | ICD-10-CM | POA: Diagnosis not present

## 2012-02-08 DIAGNOSIS — R82998 Other abnormal findings in urine: Secondary | ICD-10-CM | POA: Diagnosis not present

## 2012-02-08 DIAGNOSIS — E119 Type 2 diabetes mellitus without complications: Secondary | ICD-10-CM | POA: Diagnosis not present

## 2012-02-15 DIAGNOSIS — Z0000 Encounter for general adult medical examination without abnormal findings: Secondary | ICD-10-CM | POA: Diagnosis not present

## 2012-02-15 DIAGNOSIS — I10 Essential (primary) hypertension: Secondary | ICD-10-CM | POA: Diagnosis not present

## 2012-02-15 DIAGNOSIS — E669 Obesity, unspecified: Secondary | ICD-10-CM | POA: Diagnosis not present

## 2012-02-15 DIAGNOSIS — E1159 Type 2 diabetes mellitus with other circulatory complications: Secondary | ICD-10-CM | POA: Diagnosis not present

## 2012-02-21 ENCOUNTER — Ambulatory Visit (INDEPENDENT_AMBULATORY_CARE_PROVIDER_SITE_OTHER): Payer: Medicare Other | Admitting: Cardiology

## 2012-02-21 ENCOUNTER — Encounter: Payer: Self-pay | Admitting: Cardiology

## 2012-02-21 VITALS — BP 110/68 | HR 58 | Ht 66.0 in | Wt 212.0 lb

## 2012-02-21 DIAGNOSIS — E785 Hyperlipidemia, unspecified: Secondary | ICD-10-CM

## 2012-02-21 DIAGNOSIS — I2510 Atherosclerotic heart disease of native coronary artery without angina pectoris: Secondary | ICD-10-CM | POA: Diagnosis not present

## 2012-02-21 DIAGNOSIS — I10 Essential (primary) hypertension: Secondary | ICD-10-CM | POA: Diagnosis not present

## 2012-02-21 NOTE — Patient Instructions (Addendum)
Your physician recommends that you schedule a follow-up appointment in: MARCH 2014  Your physician recommends that you continue on your current medications as directed. Please refer to the Current Medication list given to you today.

## 2012-02-21 NOTE — Assessment & Plan Note (Signed)
She appears very stable at the present time. We discussed dual antiplatelet therapy in great detail. At the present time, bicarbonate Tonya Hunter this. She is a poorly controlled diabetic she had stent thrombosis. She is overlapping drug-eluting stents one of is a first generation.  We will continue to follow her closely, and maintain a dialogue about this. She has no interest in stopping her dual antiplatelet therapy

## 2012-02-21 NOTE — Assessment & Plan Note (Signed)
Currently stable.

## 2012-02-21 NOTE — Assessment & Plan Note (Signed)
We discussed this in some detail today. I went over with her the construct of the improvement trial, and told her that an answer will eventually be available regarding ezetamibe.  Follow up is with Dr. [name].

## 2012-02-21 NOTE — Progress Notes (Signed)
HPI:  The patient is in for followup. She's gradually recovered from all of her surgeries, and seems to be doing extremely well. She denies any ongoing chest pain, and feels good. We discussed in detail today the continued use of dual antiplatelet therapy in her case. The patient had what appears to be stent thrombosis, and a second stent placed. She has diabetes. The diabetes is chronically poorly controlled.  Current Outpatient Prescriptions  Medication Sig Dispense Refill  . aspirin 81 MG EC tablet Take 1 tablet (81 mg total) by mouth daily.      . ciprofloxacin (CIPRO) 500 MG tablet Take 1 tablet by mouth Twice daily.      . clopidogrel (PLAVIX) 75 MG tablet Take 1 tablet (75 mg total) by mouth daily.  90 tablet  3  . furosemide (LASIX) 40 MG tablet Take 40 mg by mouth daily. TAKE 1/2 (HALF) TAB QD       . insulin glargine (LANTUS) 100 UNIT/ML injection Inject 80 Units into the skin at bedtime.       . insulin lispro (HUMALOG) 100 UNIT/ML injection Inject 12 Units into the skin 3 (three) times daily before meals.       Marland Kitchen levothyroxine (SYNTHROID, LEVOTHROID) 200 MCG tablet Take 200 mcg by mouth daily.        . metoprolol (LOPRESSOR) 50 MG tablet Take 1 tablet (50 mg total) by mouth 2 (two) times daily.  180 tablet  2  . nitroGLYCERIN (NITROSTAT) 0.4 MG SL tablet TAKE 1 TABLET UNDER TONGUE AT ONSET OF CHEST PAIN; MAY REPEAT IN 5 MIN FOR UP TO 3 DOSES.  25 tablet  2  . potassium chloride (K-DUR,KLOR-CON) 10 MEQ tablet Take 1 tablet by mouth daily.      . simvastatin (ZOCOR) 20 MG tablet Take 1 tablet by mouth daily.      Marland Kitchen ZETIA 10 MG tablet Take 1 tablet by mouth daily.        Allergies  Allergen Reactions  . Meperidine Hcl   . Penicillins     Past Medical History  Diagnosis Date  . Coronary artery disease 09/2005    s/p TAXUS DRUG-ELUTING STENT PLACEMENT TO THE LEFT ANTERIOR DESCENDING ARTERY  . Hypertension   . Diabetes mellitus     TYPE II  . Hyperlipidemia   .  Osteoarthrosis, unspecified whether generalized or localized, unspecified site   . Thyroid disease     HYPOTHYROIDISM  . History of hysterectomy     Past Surgical History  Procedure Date  . Median nerve repair 2009    DECOMPRESSION...RIGHT AND LEFT DECOMPRESSION  . Appendectomy   . Cataract extraction   . Cholecystectomy   . Hernia repair   . Knee surgery   . Coronary stent placement 09/2005    PLACEMENT TO LEFT ANTERIOR DESCENDING ARTERY    Family History  Problem Relation Age of Onset  . Diabetes    . Hypertension      History   Social History  . Marital Status: Married    Spouse Name: N/A    Number of Children: N/A  . 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 Smoking status: Never Smoker   . Smokeless tobacco: Not on file  . Alcohol Use: No  . Drug Use: No  . Sexually Active:    Other Topics Concern  . Not on file   Social History Narrative  .  No narrative on file    ROS: Please see the HPI.  All other systems reviewed and negative.  PHYSICAL EXAM:  BP 110/68  Pulse 58  Ht 5' 6"  (1.676 m)  Wt 212 lb (96.163 kg)  BMI 34.22 kg/m2  SpO2 95%  General: Well developed, well nourished, in no acute distress. Head:  Normocephalic and atraumatic. Neck: no JVD Lungs: Clear to auscultation and percussion. Heart: Normal S1 and S2.  No murmur, rubs or gallops.  Pulses: Pulses normal in all 4 extremities. Extremities: No clubbing or cyanosis. No edema. Neurologic: Alert and oriented x 3.  EKG:  SB.  RBBB.  Septal MI, old.  Nonspecific T flattening.  No acute changes.   ASSESSMENT AND PLAN:

## 2012-03-06 DIAGNOSIS — H35049 Retinal micro-aneurysms, unspecified, unspecified eye: Secondary | ICD-10-CM | POA: Diagnosis not present

## 2012-03-06 DIAGNOSIS — E1139 Type 2 diabetes mellitus with other diabetic ophthalmic complication: Secondary | ICD-10-CM | POA: Diagnosis not present

## 2012-03-06 DIAGNOSIS — E11339 Type 2 diabetes mellitus with moderate nonproliferative diabetic retinopathy without macular edema: Secondary | ICD-10-CM | POA: Diagnosis not present

## 2012-03-06 DIAGNOSIS — H353190 Nonexudative age-related macular degeneration, unspecified eye, stage unspecified: Secondary | ICD-10-CM | POA: Diagnosis not present

## 2012-03-06 DIAGNOSIS — E1165 Type 2 diabetes mellitus with hyperglycemia: Secondary | ICD-10-CM | POA: Diagnosis not present

## 2012-03-06 DIAGNOSIS — H43819 Vitreous degeneration, unspecified eye: Secondary | ICD-10-CM | POA: Diagnosis not present

## 2012-06-18 DIAGNOSIS — I10 Essential (primary) hypertension: Secondary | ICD-10-CM | POA: Diagnosis not present

## 2012-06-18 DIAGNOSIS — E1159 Type 2 diabetes mellitus with other circulatory complications: Secondary | ICD-10-CM | POA: Diagnosis not present

## 2012-06-18 DIAGNOSIS — E039 Hypothyroidism, unspecified: Secondary | ICD-10-CM | POA: Diagnosis not present

## 2012-06-18 DIAGNOSIS — E119 Type 2 diabetes mellitus without complications: Secondary | ICD-10-CM | POA: Diagnosis not present

## 2012-07-19 DIAGNOSIS — Z85828 Personal history of other malignant neoplasm of skin: Secondary | ICD-10-CM | POA: Diagnosis not present

## 2012-07-19 DIAGNOSIS — L570 Actinic keratosis: Secondary | ICD-10-CM | POA: Diagnosis not present

## 2012-07-19 DIAGNOSIS — D485 Neoplasm of uncertain behavior of skin: Secondary | ICD-10-CM | POA: Diagnosis not present

## 2012-07-19 DIAGNOSIS — L538 Other specified erythematous conditions: Secondary | ICD-10-CM | POA: Diagnosis not present

## 2012-07-24 ENCOUNTER — Encounter: Payer: Self-pay | Admitting: Cardiology

## 2012-07-24 ENCOUNTER — Ambulatory Visit (INDEPENDENT_AMBULATORY_CARE_PROVIDER_SITE_OTHER): Payer: Medicare Other | Admitting: Cardiology

## 2012-07-24 VITALS — BP 136/64 | HR 65 | Ht 66.0 in | Wt 210.2 lb

## 2012-07-24 DIAGNOSIS — I10 Essential (primary) hypertension: Secondary | ICD-10-CM

## 2012-07-24 DIAGNOSIS — I2510 Atherosclerotic heart disease of native coronary artery without angina pectoris: Secondary | ICD-10-CM | POA: Diagnosis not present

## 2012-07-24 DIAGNOSIS — E785 Hyperlipidemia, unspecified: Secondary | ICD-10-CM

## 2012-07-24 NOTE — Patient Instructions (Addendum)
Your physician recommends that you continue on your current medications as directed. Please refer to the Current Medication list given to you today.  Your physician wants you to follow-up in: 6 months. You will receive a reminder letter in the mail two months in advance. If you don't receive a letter, please call our office to schedule the follow-up appointment.  

## 2012-07-24 NOTE — Assessment & Plan Note (Signed)
Controlled at present.

## 2012-07-24 NOTE — Assessment & Plan Note (Signed)
This nice lady had a first generation drug-eluting stent placed by Dr. Olevia Perches in 2007. In 2011, she presented with acute stent thrombosis following a orthopedic procedure. She underwent urgent catheterization, and had a clotted first generation drug-eluting stent. She underwent repeat dilatation with placement of a second stent, has done well since that time. We discussed the options in great detail. We plan to continue her on dual antiplatelet therapy, for 2 specific reasons, 1/  DM, 2/  VLST associated with first generation DES  (TAXUS).  She has minor bruising but no clinical bleeding.

## 2012-07-24 NOTE — Progress Notes (Signed)
HPI:   Nice lady is in for followup. We had an extensive discussion regarding her clinical situation. She is doing really quite well. She has no major specific complaints. She denies any chest pain or progressive shortness of breath. We went over in detail her dual antiplatelet therapy a day, and discussed all the options available to her.  We also discussed the IMPROVE IT study and its impact on her care, specifically in the ZETIA puts her in the donut hole.  Also, she says she has started bowling again.  She did ask about a prominent clavicle today.      Current Outpatient Prescriptions  Medication Sig Dispense Refill  . aspirin 81 MG EC tablet Take 325 mg by mouth daily.       . clopidogrel (PLAVIX) 75 MG tablet Take 1 tablet (75 mg total) by mouth daily.  90 tablet  3  . furosemide (LASIX) 40 MG tablet Take 40 mg by mouth daily. TAKE 1/2 (HALF) TAB QD       . insulin glargine (LANTUS) 100 UNIT/ML injection Inject 80 Units into the skin at bedtime.       Marland Kitchen levothyroxine (SYNTHROID, LEVOTHROID) 200 MCG tablet Take 200 mcg by mouth daily.        . metFORMIN (GLUCOPHAGE) 500 MG tablet Take 500 mg by mouth 4 (four) times daily. 2 in the morning 2 in the evening.      . metoprolol (LOPRESSOR) 50 MG tablet Take 1 tablet (50 mg total) by mouth 2 (two) times daily.  180 tablet  2  . nitroGLYCERIN (NITROSTAT) 0.4 MG SL tablet TAKE 1 TABLET UNDER TONGUE AT ONSET OF CHEST PAIN; MAY REPEAT IN 5 MIN FOR UP TO 3 DOSES.  25 tablet  2  . potassium chloride (K-DUR,KLOR-CON) 10 MEQ tablet Take 1 tablet by mouth daily.      . simvastatin (ZOCOR) 20 MG tablet Take 1 tablet by mouth daily.      Marland Kitchen ZETIA 10 MG tablet Take 1 tablet by mouth daily.       No current facility-administered medications for this visit.    Allergies  Allergen Reactions  . Meperidine Hcl   . Penicillins     Past Medical History  Diagnosis Date  . Coronary artery disease 09/2005    s/p TAXUS DRUG-ELUTING STENT PLACEMENT TO THE  LEFT ANTERIOR DESCENDING ARTERY  . Hypertension   . Diabetes mellitus     TYPE II  . Hyperlipidemia   . Osteoarthrosis, unspecified whether generalized or localized, unspecified site   . Thyroid disease     HYPOTHYROIDISM  . History of hysterectomy     Past Surgical History  Procedure Laterality Date  . Median nerve repair  2009    DECOMPRESSION...RIGHT AND LEFT DECOMPRESSION  . Appendectomy    . Cataract extraction    . Cholecystectomy    . Hernia repair    . Knee surgery    . Coronary stent placement  09/2005    PLACEMENT TO LEFT ANTERIOR DESCENDING ARTERY    Family History  Problem Relation Age of Onset  . Diabetes    . Hypertension      History   Social History  . Marital Status: Married    Spouse Name: N/A    Number of Children: N/A  . 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 Smoking status: Never Smoker   . Smokeless tobacco: Not  on file  . Alcohol Use: No  . Drug Use: No  . Sexually Active:    Other Topics Concern  . Not on file   Social History Narrative  . No narrative on file    ROS: Please see the HPI.  All other systems reviewed and negative.  PHYSICAL EXAM:  BP 136/64  Pulse 65  Ht 5' 6"  (1.676 m)  Wt 210 lb 3.2 oz (95.346 kg)  BMI 33.94 kg/m2  SpO2 98%  General: Well developed, well nourished, in no acute distress. Head:  Normocephalic and atraumatic. Neck: no JVD.  Prominent R clavicular head.   Lungs: Clear to auscultation and percussion. Heart: Normal S1 and S2.  No murmur, rubs or gallops.  Pulses: Pulses normal in all 4 extremities. Extremities: No clubbing or cyanosis. No edema. Neurologic: Alert and oriented x 3.  EKG:  NSR.  RBBB.    ASSESSMENT AND PLAN:  We will transition her to the care of Dr. Johnsie Cancel who provides care for her husband.   Clavicle looks like from healed fracture?.  Suggested she review with her primary MD at next visit.     Total review time:  42 minutes.

## 2012-07-24 NOTE — Assessment & Plan Note (Signed)
Discussed the upcoming results of IMPROVE IT, which should tell us if ezetamibe provides clinical benefit beyond LDL lowering.  She has not tolerated either Crestor or Lipitor in the past, so I would not change treatment.

## 2012-07-26 DIAGNOSIS — F4323 Adjustment disorder with mixed anxiety and depressed mood: Secondary | ICD-10-CM | POA: Diagnosis not present

## 2012-08-15 DIAGNOSIS — I10 Essential (primary) hypertension: Secondary | ICD-10-CM | POA: Diagnosis not present

## 2012-08-15 DIAGNOSIS — F341 Dysthymic disorder: Secondary | ICD-10-CM | POA: Diagnosis not present

## 2012-08-15 DIAGNOSIS — E782 Mixed hyperlipidemia: Secondary | ICD-10-CM | POA: Diagnosis not present

## 2012-08-15 DIAGNOSIS — E1159 Type 2 diabetes mellitus with other circulatory complications: Secondary | ICD-10-CM | POA: Diagnosis not present

## 2012-08-15 DIAGNOSIS — E669 Obesity, unspecified: Secondary | ICD-10-CM | POA: Diagnosis not present

## 2012-08-15 DIAGNOSIS — I259 Chronic ischemic heart disease, unspecified: Secondary | ICD-10-CM | POA: Diagnosis not present

## 2012-08-15 DIAGNOSIS — E039 Hypothyroidism, unspecified: Secondary | ICD-10-CM | POA: Diagnosis not present

## 2012-08-15 DIAGNOSIS — Z1331 Encounter for screening for depression: Secondary | ICD-10-CM | POA: Diagnosis not present

## 2012-09-04 DIAGNOSIS — H353190 Nonexudative age-related macular degeneration, unspecified eye, stage unspecified: Secondary | ICD-10-CM | POA: Diagnosis not present

## 2012-09-04 DIAGNOSIS — E1139 Type 2 diabetes mellitus with other diabetic ophthalmic complication: Secondary | ICD-10-CM | POA: Diagnosis not present

## 2012-09-04 DIAGNOSIS — H35049 Retinal micro-aneurysms, unspecified, unspecified eye: Secondary | ICD-10-CM | POA: Diagnosis not present

## 2012-09-04 DIAGNOSIS — E11339 Type 2 diabetes mellitus with moderate nonproliferative diabetic retinopathy without macular edema: Secondary | ICD-10-CM | POA: Diagnosis not present

## 2012-10-16 DIAGNOSIS — Z85828 Personal history of other malignant neoplasm of skin: Secondary | ICD-10-CM | POA: Diagnosis not present

## 2012-10-16 DIAGNOSIS — L821 Other seborrheic keratosis: Secondary | ICD-10-CM | POA: Diagnosis not present

## 2012-10-16 DIAGNOSIS — L570 Actinic keratosis: Secondary | ICD-10-CM | POA: Diagnosis not present

## 2012-10-16 DIAGNOSIS — L708 Other acne: Secondary | ICD-10-CM | POA: Diagnosis not present

## 2012-10-19 DIAGNOSIS — Z6832 Body mass index (BMI) 32.0-32.9, adult: Secondary | ICD-10-CM | POA: Diagnosis not present

## 2012-10-19 DIAGNOSIS — I259 Chronic ischemic heart disease, unspecified: Secondary | ICD-10-CM | POA: Diagnosis not present

## 2012-10-19 DIAGNOSIS — E782 Mixed hyperlipidemia: Secondary | ICD-10-CM | POA: Diagnosis not present

## 2012-10-19 DIAGNOSIS — E039 Hypothyroidism, unspecified: Secondary | ICD-10-CM | POA: Diagnosis not present

## 2012-10-19 DIAGNOSIS — I10 Essential (primary) hypertension: Secondary | ICD-10-CM | POA: Diagnosis not present

## 2012-10-19 DIAGNOSIS — E1159 Type 2 diabetes mellitus with other circulatory complications: Secondary | ICD-10-CM | POA: Diagnosis not present

## 2012-10-19 DIAGNOSIS — E669 Obesity, unspecified: Secondary | ICD-10-CM | POA: Diagnosis not present

## 2012-11-21 DIAGNOSIS — E1159 Type 2 diabetes mellitus with other circulatory complications: Secondary | ICD-10-CM | POA: Diagnosis not present

## 2012-11-21 DIAGNOSIS — I10 Essential (primary) hypertension: Secondary | ICD-10-CM | POA: Diagnosis not present

## 2012-12-01 ENCOUNTER — Other Ambulatory Visit: Payer: Self-pay | Admitting: Cardiology

## 2013-01-16 DIAGNOSIS — B373 Candidiasis of vulva and vagina: Secondary | ICD-10-CM | POA: Diagnosis not present

## 2013-01-16 DIAGNOSIS — I10 Essential (primary) hypertension: Secondary | ICD-10-CM | POA: Diagnosis not present

## 2013-01-16 DIAGNOSIS — E782 Mixed hyperlipidemia: Secondary | ICD-10-CM | POA: Diagnosis not present

## 2013-01-16 DIAGNOSIS — E1159 Type 2 diabetes mellitus with other circulatory complications: Secondary | ICD-10-CM | POA: Diagnosis not present

## 2013-01-21 ENCOUNTER — Ambulatory Visit: Payer: Medicare Other | Admitting: Cardiovascular Disease

## 2013-01-31 ENCOUNTER — Ambulatory Visit (INDEPENDENT_AMBULATORY_CARE_PROVIDER_SITE_OTHER): Payer: Medicare Other | Admitting: Cardiovascular Disease

## 2013-01-31 ENCOUNTER — Encounter: Payer: Self-pay | Admitting: Cardiovascular Disease

## 2013-01-31 VITALS — BP 120/68 | HR 68 | Wt 209.0 lb

## 2013-01-31 DIAGNOSIS — I2510 Atherosclerotic heart disease of native coronary artery without angina pectoris: Secondary | ICD-10-CM

## 2013-01-31 DIAGNOSIS — Z862 Personal history of diseases of the blood and blood-forming organs and certain disorders involving the immune mechanism: Secondary | ICD-10-CM

## 2013-01-31 DIAGNOSIS — E785 Hyperlipidemia, unspecified: Secondary | ICD-10-CM

## 2013-01-31 DIAGNOSIS — Z79899 Other long term (current) drug therapy: Secondary | ICD-10-CM

## 2013-01-31 DIAGNOSIS — I10 Essential (primary) hypertension: Secondary | ICD-10-CM | POA: Diagnosis not present

## 2013-01-31 DIAGNOSIS — E119 Type 2 diabetes mellitus without complications: Secondary | ICD-10-CM

## 2013-01-31 NOTE — Assessment & Plan Note (Signed)
Stable with no angina and good activity level.  Continue medical Rx Continue DAT per TS

## 2013-01-31 NOTE — Assessment & Plan Note (Signed)
TSH with lab work  F/u [employer]

## 2013-01-31 NOTE — Patient Instructions (Addendum)
Your physician wants you to follow-up in:   [place] will receive a reminder letter in the mail two months in advance. If you don't receive a letter, please call our office to schedule the follow-up appointment. Your physician recommends that you continue on your current medications as directed. Please refer to the Current Medication list given to you today.  Your physician recommends that you return for lab work in: FASTING  LIPID  TSH  FREE T4  HGB A1C

## 2013-01-31 NOTE — Assessment & Plan Note (Signed)
Victoza too expensize F/U [name] ? Start ACE for renal protection

## 2013-01-31 NOTE — Assessment & Plan Note (Signed)
Will check labs but think its ok to stop zetia  Can change statin to lipitor if LDL over 100.

## 2013-01-31 NOTE — Assessment & Plan Note (Signed)
Well controlled.  Continue current medications and low sodium Dash type diet.    

## 2013-01-31 NOTE — Progress Notes (Signed)
Patient ID: Tonya Hunter, female   DOB: March 11, 1935, 78 y.o.   MRN: [id number] 78 yo patient previously seen by Dr Lia Foyer  Stent to LAD in 2007  CRF;s elevated cholesterol and HTN.  Last cath in 2007 stent patent EF has been ok.  IDDM and not on ACE  Hard to afford zetia due to donut whole.  Sets her back $400.  No chest pain  Needs labs No recent lipid thyroid or A1c.  Sees Dr [name] as primary.  Has both knees replaced and ambulating well.  Dr [name] does her ortho work.  Discussed staying on DAT which TS suggested due to her restenosis.    ROS: Denies fever, malais, weight loss, blurry vision, decreased visual acuity, cough, sputum, SOB, hemoptysis, pleuritic pain, palpitaitons, heartburn, abdominal pain, melena, lower extremity edema, claudication, or rash.  All other systems reviewed and negative   General: Affect appropriate Healthy:  appears stated age 78: normal Neck supple with no adenopathy JVP normal no bruits no thyromegaly Lungs clear with no wheezing and good diaphragmatic motion Heart:  S1/S2 no murmur,rub, gallop or click PMI normal Abdomen: benighn, BS positve, no tenderness, no AAA no bruit.  No HSM or HJR Distal pulses intact with no bruits No edema Neuro non-focal Skin warm and dry No muscular weakness  S/P bilateral TKR   Medications Current Outpatient Prescriptions  Medication Sig Dispense Refill  . aspirin 81 MG EC tablet Take 325 mg by mouth daily.       . clopidogrel (PLAVIX) 75 MG tablet TAKE 1 TABLET BY MOUTH DAILY  90 tablet  1  . furosemide (LASIX) 40 MG tablet TAKE 1/2 (HALF) TAB QD      . insulin glargine (LANTUS) 100 UNIT/ML injection Inject into the skin at bedtime. 75/25  As directed      . levothyroxine (SYNTHROID, LEVOTHROID) 200 MCG tablet Take 200 mcg by mouth daily.        . metFORMIN (GLUCOPHAGE) 500 MG tablet 2 in the morning 2 in the evening.      . metoprolol (LOPRESSOR) 50 MG tablet Take 1 tablet (50 mg total) by mouth 2 (two) times  daily.  180 tablet  2  . nitroGLYCERIN (NITROSTAT) 0.4 MG SL tablet TAKE 1 TABLET UNDER TONGUE AT ONSET OF CHEST PAIN; MAY REPEAT IN 5 MIN FOR UP TO 3 DOSES.  25 tablet  2  . potassium chloride (K-DUR,KLOR-CON) 10 MEQ tablet Take 1 tablet by mouth daily.      . simvastatin (ZOCOR) 20 MG tablet Take 1 tablet by mouth daily.      Marland Kitchen ZETIA 10 MG tablet Take 1 tablet by mouth daily.       No current facility-administered medications for this visit.    Allergies Meperidine hcl and Penicillins  Family History: Family History  Problem Relation Age of Onset  . Diabetes    . Hypertension      Social History: History   Social History  . Marital Status: Married    Spouse Name: N/A    Number of Children: N/A  . 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 Smoking status: Never Smoker   . Smokeless tobacco: Not on file  . Alcohol Use: No  . Drug Use: No  . Sexual Activity:    Other Topics Concern  . Not on file   Social History Narrative  . No narrative on file  Electrocardiogram:  Assessment and Plan

## 2013-02-01 ENCOUNTER — Other Ambulatory Visit (INDEPENDENT_AMBULATORY_CARE_PROVIDER_SITE_OTHER): Payer: Medicare Other

## 2013-02-01 DIAGNOSIS — Z79899 Other long term (current) drug therapy: Secondary | ICD-10-CM

## 2013-02-01 DIAGNOSIS — E785 Hyperlipidemia, unspecified: Secondary | ICD-10-CM

## 2013-02-01 LAB — LIPID PANEL
Cholesterol: 128 mg/dL (ref 0–200)
VLDL: 21.8 mg/dL (ref 0.0–40.0)

## 2013-02-01 LAB — TSH: TSH: 0.96 u[IU]/mL (ref 0.35–5.50)

## 2013-02-01 LAB — HEMOGLOBIN A1C: Hgb A1c MFr Bld: 10 % — ABNORMAL HIGH (ref 4.6–6.5)

## 2013-02-07 ENCOUNTER — Telehealth: Payer: Self-pay | Admitting: Cardiovascular Disease

## 2013-02-07 NOTE — Telephone Encounter (Signed)
Follow up  Pt returning the call.

## 2013-02-07 NOTE — Telephone Encounter (Signed)
PT AWARE OF LAB RESULTS./CY 

## 2013-02-13 DIAGNOSIS — R82998 Other abnormal findings in urine: Secondary | ICD-10-CM | POA: Diagnosis not present

## 2013-02-13 DIAGNOSIS — E039 Hypothyroidism, unspecified: Secondary | ICD-10-CM | POA: Diagnosis not present

## 2013-02-13 DIAGNOSIS — E1159 Type 2 diabetes mellitus with other circulatory complications: Secondary | ICD-10-CM | POA: Diagnosis not present

## 2013-02-20 ENCOUNTER — Other Ambulatory Visit: Payer: Self-pay | Admitting: Internal Medicine

## 2013-02-20 DIAGNOSIS — I10 Essential (primary) hypertension: Secondary | ICD-10-CM | POA: Diagnosis not present

## 2013-02-20 DIAGNOSIS — E669 Obesity, unspecified: Secondary | ICD-10-CM | POA: Diagnosis not present

## 2013-02-20 DIAGNOSIS — E1159 Type 2 diabetes mellitus with other circulatory complications: Secondary | ICD-10-CM | POA: Diagnosis not present

## 2013-02-20 DIAGNOSIS — F341 Dysthymic disorder: Secondary | ICD-10-CM | POA: Diagnosis not present

## 2013-02-20 DIAGNOSIS — I259 Chronic ischemic heart disease, unspecified: Secondary | ICD-10-CM | POA: Diagnosis not present

## 2013-02-20 DIAGNOSIS — Z6833 Body mass index (BMI) 33.0-33.9, adult: Secondary | ICD-10-CM | POA: Diagnosis not present

## 2013-02-20 DIAGNOSIS — E782 Mixed hyperlipidemia: Secondary | ICD-10-CM | POA: Diagnosis not present

## 2013-02-20 DIAGNOSIS — E039 Hypothyroidism, unspecified: Secondary | ICD-10-CM | POA: Diagnosis not present

## 2013-02-20 DIAGNOSIS — R945 Abnormal results of liver function studies: Secondary | ICD-10-CM

## 2013-02-20 DIAGNOSIS — Z0000 Encounter for general adult medical examination without abnormal findings: Secondary | ICD-10-CM | POA: Diagnosis not present

## 2013-02-28 ENCOUNTER — Other Ambulatory Visit: Payer: Medicare Other

## 2013-03-01 ENCOUNTER — Ambulatory Visit
Admission: RE | Admit: 2013-03-01 | Discharge: 2013-03-01 | Disposition: A | Discharge status: Home / Self Care | Payer: Medicare Other | Source: Ambulatory Visit | Attending: Internal Medicine | Admitting: Internal Medicine

## 2013-03-01 DIAGNOSIS — R7989 Other specified abnormal findings of blood chemistry: Secondary | ICD-10-CM | POA: Diagnosis not present

## 2013-03-01 DIAGNOSIS — R945 Abnormal results of liver function studies: Secondary | ICD-10-CM

## 2013-03-04 ENCOUNTER — Other Ambulatory Visit: Payer: Medicare Other

## 2013-03-08 DIAGNOSIS — H35049 Retinal micro-aneurysms, unspecified, unspecified eye: Secondary | ICD-10-CM | POA: Diagnosis not present

## 2013-03-08 DIAGNOSIS — E1139 Type 2 diabetes mellitus with other diabetic ophthalmic complication: Secondary | ICD-10-CM | POA: Diagnosis not present

## 2013-03-08 DIAGNOSIS — H35349 Macular cyst, hole, or pseudohole, unspecified eye: Secondary | ICD-10-CM | POA: Diagnosis not present

## 2013-03-08 DIAGNOSIS — E11349 Type 2 diabetes mellitus with severe nonproliferative diabetic retinopathy without macular edema: Secondary | ICD-10-CM | POA: Diagnosis not present

## 2013-04-10 DIAGNOSIS — Z6833 Body mass index (BMI) 33.0-33.9, adult: Secondary | ICD-10-CM | POA: Diagnosis not present

## 2013-04-10 DIAGNOSIS — I10 Essential (primary) hypertension: Secondary | ICD-10-CM | POA: Diagnosis not present

## 2013-04-10 DIAGNOSIS — I259 Chronic ischemic heart disease, unspecified: Secondary | ICD-10-CM | POA: Diagnosis not present

## 2013-04-10 DIAGNOSIS — E1159 Type 2 diabetes mellitus with other circulatory complications: Secondary | ICD-10-CM | POA: Diagnosis not present

## 2013-06-10 ENCOUNTER — Other Ambulatory Visit: Payer: Self-pay | Admitting: Cardiovascular Disease

## 2013-07-10 DIAGNOSIS — M7989 Other specified soft tissue disorders: Secondary | ICD-10-CM | POA: Diagnosis not present

## 2013-07-10 DIAGNOSIS — S90129A Contusion of unspecified lesser toe(s) without damage to nail, initial encounter: Secondary | ICD-10-CM | POA: Diagnosis not present

## 2013-07-10 DIAGNOSIS — E039 Hypothyroidism, unspecified: Secondary | ICD-10-CM | POA: Diagnosis not present

## 2013-07-10 DIAGNOSIS — M79609 Pain in unspecified limb: Secondary | ICD-10-CM | POA: Diagnosis not present

## 2013-07-10 DIAGNOSIS — S92909A Unspecified fracture of unspecified foot, initial encounter for closed fracture: Secondary | ICD-10-CM | POA: Diagnosis not present

## 2013-07-10 DIAGNOSIS — IMO0002 Reserved for concepts with insufficient information to code with codable children: Secondary | ICD-10-CM | POA: Diagnosis not present

## 2013-07-10 DIAGNOSIS — E1159 Type 2 diabetes mellitus with other circulatory complications: Secondary | ICD-10-CM | POA: Diagnosis not present

## 2013-07-10 DIAGNOSIS — I10 Essential (primary) hypertension: Secondary | ICD-10-CM | POA: Diagnosis not present

## 2013-07-10 DIAGNOSIS — I259 Chronic ischemic heart disease, unspecified: Secondary | ICD-10-CM | POA: Diagnosis not present

## 2013-07-10 DIAGNOSIS — L089 Local infection of the skin and subcutaneous tissue, unspecified: Secondary | ICD-10-CM | POA: Diagnosis not present

## 2013-07-10 DIAGNOSIS — Z6833 Body mass index (BMI) 33.0-33.9, adult: Secondary | ICD-10-CM | POA: Diagnosis not present

## 2013-07-19 DIAGNOSIS — S90129A Contusion of unspecified lesser toe(s) without damage to nail, initial encounter: Secondary | ICD-10-CM | POA: Diagnosis not present

## 2013-07-25 DIAGNOSIS — E11349 Type 2 diabetes mellitus with severe nonproliferative diabetic retinopathy without macular edema: Secondary | ICD-10-CM | POA: Diagnosis not present

## 2013-07-25 DIAGNOSIS — E1139 Type 2 diabetes mellitus with other diabetic ophthalmic complication: Secondary | ICD-10-CM | POA: Diagnosis not present

## 2013-07-25 DIAGNOSIS — E1165 Type 2 diabetes mellitus with hyperglycemia: Secondary | ICD-10-CM | POA: Diagnosis not present

## 2013-07-25 DIAGNOSIS — H3560 Retinal hemorrhage, unspecified eye: Secondary | ICD-10-CM | POA: Diagnosis not present

## 2013-08-06 ENCOUNTER — Ambulatory Visit: Payer: Medicare Other | Admitting: Cardiovascular Disease

## 2013-08-07 ENCOUNTER — Ambulatory Visit: Payer: Medicare Other | Admitting: Cardiovascular Disease

## 2013-08-14 ENCOUNTER — Encounter: Payer: Self-pay | Admitting: Cardiovascular Disease

## 2013-08-14 ENCOUNTER — Ambulatory Visit (INDEPENDENT_AMBULATORY_CARE_PROVIDER_SITE_OTHER): Payer: Medicare Other | Admitting: Cardiovascular Disease

## 2013-08-14 VITALS — BP 124/78 | HR 74 | Ht 66.0 in | Wt 199.0 lb

## 2013-08-14 DIAGNOSIS — I2510 Atherosclerotic heart disease of native coronary artery without angina pectoris: Secondary | ICD-10-CM

## 2013-08-14 DIAGNOSIS — E785 Hyperlipidemia, unspecified: Secondary | ICD-10-CM

## 2013-08-14 DIAGNOSIS — E119 Type 2 diabetes mellitus without complications: Secondary | ICD-10-CM

## 2013-08-14 DIAGNOSIS — I10 Essential (primary) hypertension: Secondary | ICD-10-CM

## 2013-08-14 NOTE — Assessment & Plan Note (Signed)
Well controlled.  Continue current medications and low sodium Dash type diet.    

## 2013-08-14 NOTE — Assessment & Plan Note (Signed)
Stable with no angina and good activity level.  Continue medical Rx  

## 2013-08-14 NOTE — Progress Notes (Signed)
Patient ID: Tonya Hunter, female   DOB: 1935-02-03, 79 y.o.   MRN: [id number] 79 yo patient previously seen by Dr Lia Foyer Stent to LAD in 2007 CRF;s elevated cholesterol and HTN. Last cath in 2007 stent patent EF has been ok. IDDM and not on ACE Hard to afford zetia due to donut whole. Sets her back $400. No chest pain Needs labs No recent lipid thyroid or A1c. Sees Dr [name] as primary. Has both knees replaced and ambulating well. Dr [name] does her ortho work. Discussed staying on DAT which TS suggested due to her restenosis.   No chest pain Compliant with meds Horrible BS control  A1c over 10  Seeing Dr [name] and insulin being adjusted She has a hard time with her diet Vision worse and seeing [facility]       ROS: Denies fever, malais, weight loss, blurry vision, decreased visual acuity, cough, sputum, SOB, hemoptysis, pleuritic pain, palpitaitons, heartburn, abdominal pain, melena, lower extremity edema, claudication, or rash.  All other systems reviewed and negative  General: Affect appropriate Healthy:  appears stated age 52: normal Neck supple with no adenopathy JVP normal no bruits no thyromegaly Lungs clear with no wheezing and good diaphragmatic motion Heart:  S1/S2 no murmur, no rub, gallop or click PMI normal Abdomen: benighn, BS positve, no tenderness, no AAA no bruit.  No HSM or HJR Distal pulses intact with no bruits No edema Neuro non-focal Skin warm and dry No muscular weakness bilateral TKR with mild echymosis over RLE from fall playing shuffle board   Current Outpatient Prescriptions  Medication Sig Dispense Refill  . aspirin 81 MG EC tablet Take 325 mg by mouth daily.       . clopidogrel (PLAVIX) 75 MG tablet TAKE 1 TABLET BY MOUTH EVERY DAY  90 tablet  0  . furosemide (LASIX) 40 MG tablet TAKE 1/2 (HALF) TAB QD      . insulin glargine (LANTUS) 100 UNIT/ML injection Inject 50 Units into the skin at bedtime.      . insulin lispro protamine-lispro (HUMALOG  75/25 MIX) (75-25) 100 UNIT/ML SUSP injection Inject 30 Units into the skin 3 (three) times daily.       Marland Kitchen levothyroxine (SYNTHROID, LEVOTHROID) 200 MCG tablet Take 200 mcg by mouth daily.        . metFORMIN (GLUCOPHAGE) 500 MG tablet 2 in the morning 2 in the evening.      . metoprolol (LOPRESSOR) 50 MG tablet Take 1 tablet (50 mg total) by mouth 2 (two) times daily.  180 tablet  2  . nitroGLYCERIN (NITROSTAT) 0.4 MG SL tablet TAKE 1 TABLET UNDER TONGUE AT ONSET OF CHEST PAIN; MAY REPEAT IN 5 MIN FOR UP TO 3 DOSES.  25 tablet  2  . potassium chloride (K-DUR,KLOR-CON) 10 MEQ tablet Take 1 tablet by mouth daily.      . simvastatin (ZOCOR) 20 MG tablet Take 1 tablet by mouth daily.      Marland Kitchen ZETIA 10 MG tablet Take 1 tablet by mouth daily.       No current facility-administered medications for this visit.    Allergies  Meperidine hcl and Penicillins  Electrocardiogram:  07/24/12  SR RBBB   Today SR rate 74 RBBB no change   Assessment and Plan

## 2013-08-14 NOTE — Assessment & Plan Note (Signed)
Cholesterol is at goal.  Continue current dose of statin and diet Rx.  No myalgias or side effects.  F/U  LFT's in 6 months. Lab Results  Component Value Date   LDLCALC 59 02/01/2013

## 2013-08-14 NOTE — Assessment & Plan Note (Signed)
F/U Tonya Hunter  Big issue is inability to eat low carb diet.  Concern about recurrent CAD due to poor BS control discussed Dr Zadie Rhine will not work on eyes until better controlled as well

## 2013-08-14 NOTE — Patient Instructions (Addendum)
Your physician wants you to follow-up in:   [place] will receive a reminder letter in the mail two months in advance. If you don't receive a letter, please call our office to schedule the follow-up appointment. Your physician recommends that you continue on your current medications as directed. Please refer to the Current Medication list given to you today.

## 2013-09-13 ENCOUNTER — Other Ambulatory Visit: Payer: Self-pay | Admitting: Cardiovascular Disease

## 2013-10-15 ENCOUNTER — Other Ambulatory Visit: Payer: Self-pay | Admitting: Dermatology

## 2013-10-15 DIAGNOSIS — D485 Neoplasm of uncertain behavior of skin: Secondary | ICD-10-CM | POA: Diagnosis not present

## 2013-10-15 DIAGNOSIS — L259 Unspecified contact dermatitis, unspecified cause: Secondary | ICD-10-CM | POA: Diagnosis not present

## 2013-10-15 DIAGNOSIS — L571 Actinic reticuloid: Secondary | ICD-10-CM | POA: Diagnosis not present

## 2013-10-15 DIAGNOSIS — L570 Actinic keratosis: Secondary | ICD-10-CM | POA: Diagnosis not present

## 2013-10-15 DIAGNOSIS — Z85828 Personal history of other malignant neoplasm of skin: Secondary | ICD-10-CM | POA: Diagnosis not present

## 2013-10-15 DIAGNOSIS — D0470 Carcinoma in situ of skin of unspecified lower limb, including hip: Secondary | ICD-10-CM | POA: Diagnosis not present

## 2013-10-15 DIAGNOSIS — L708 Other acne: Secondary | ICD-10-CM | POA: Diagnosis not present

## 2013-10-15 DIAGNOSIS — L821 Other seborrheic keratosis: Secondary | ICD-10-CM | POA: Diagnosis not present

## 2013-10-15 DIAGNOSIS — L538 Other specified erythematous conditions: Secondary | ICD-10-CM | POA: Diagnosis not present

## 2013-10-24 DIAGNOSIS — L538 Other specified erythematous conditions: Secondary | ICD-10-CM | POA: Diagnosis not present

## 2013-10-24 DIAGNOSIS — L570 Actinic keratosis: Secondary | ICD-10-CM | POA: Diagnosis not present

## 2013-10-24 DIAGNOSIS — Z85828 Personal history of other malignant neoplasm of skin: Secondary | ICD-10-CM | POA: Diagnosis not present

## 2013-10-28 DIAGNOSIS — F329 Major depressive disorder, single episode, unspecified: Secondary | ICD-10-CM | POA: Diagnosis not present

## 2013-10-28 DIAGNOSIS — I259 Chronic ischemic heart disease, unspecified: Secondary | ICD-10-CM | POA: Diagnosis not present

## 2013-10-28 DIAGNOSIS — E039 Hypothyroidism, unspecified: Secondary | ICD-10-CM | POA: Diagnosis not present

## 2013-10-28 DIAGNOSIS — I10 Essential (primary) hypertension: Secondary | ICD-10-CM | POA: Diagnosis not present

## 2013-10-28 DIAGNOSIS — Z6833 Body mass index (BMI) 33.0-33.9, adult: Secondary | ICD-10-CM | POA: Diagnosis not present

## 2013-10-28 DIAGNOSIS — E1159 Type 2 diabetes mellitus with other circulatory complications: Secondary | ICD-10-CM | POA: Diagnosis not present

## 2013-10-28 DIAGNOSIS — E782 Mixed hyperlipidemia: Secondary | ICD-10-CM | POA: Diagnosis not present

## 2013-11-07 DIAGNOSIS — H353190 Nonexudative age-related macular degeneration, unspecified eye, stage unspecified: Secondary | ICD-10-CM | POA: Diagnosis not present

## 2013-11-07 DIAGNOSIS — E1165 Type 2 diabetes mellitus with hyperglycemia: Secondary | ICD-10-CM | POA: Diagnosis not present

## 2013-11-07 DIAGNOSIS — H35359 Cystoid macular degeneration, unspecified eye: Secondary | ICD-10-CM | POA: Diagnosis not present

## 2013-11-07 DIAGNOSIS — R42 Dizziness and giddiness: Secondary | ICD-10-CM | POA: Diagnosis not present

## 2013-11-07 DIAGNOSIS — E11349 Type 2 diabetes mellitus with severe nonproliferative diabetic retinopathy without macular edema: Secondary | ICD-10-CM | POA: Diagnosis not present

## 2013-11-07 DIAGNOSIS — E1139 Type 2 diabetes mellitus with other diabetic ophthalmic complication: Secondary | ICD-10-CM | POA: Diagnosis not present

## 2013-11-28 NOTE — Telephone Encounter (Signed)
Close Encounter

## 2014-02-21 DIAGNOSIS — E782 Mixed hyperlipidemia: Secondary | ICD-10-CM | POA: Diagnosis not present

## 2014-02-21 DIAGNOSIS — R8299 Other abnormal findings in urine: Secondary | ICD-10-CM | POA: Diagnosis not present

## 2014-02-21 DIAGNOSIS — R8290 Unspecified abnormal findings in urine: Secondary | ICD-10-CM | POA: Diagnosis not present

## 2014-02-21 DIAGNOSIS — E039 Hypothyroidism, unspecified: Secondary | ICD-10-CM | POA: Diagnosis not present

## 2014-02-21 DIAGNOSIS — E1151 Type 2 diabetes mellitus with diabetic peripheral angiopathy without gangrene: Secondary | ICD-10-CM | POA: Diagnosis not present

## 2014-02-21 DIAGNOSIS — I10 Essential (primary) hypertension: Secondary | ICD-10-CM | POA: Diagnosis not present

## 2014-02-27 ENCOUNTER — Other Ambulatory Visit: Payer: Self-pay | Admitting: Internal Medicine

## 2014-02-27 DIAGNOSIS — E039 Hypothyroidism, unspecified: Secondary | ICD-10-CM | POA: Diagnosis not present

## 2014-02-27 DIAGNOSIS — Z0000 Encounter for general adult medical examination without abnormal findings: Secondary | ICD-10-CM | POA: Diagnosis not present

## 2014-02-27 DIAGNOSIS — Z1231 Encounter for screening mammogram for malignant neoplasm of breast: Secondary | ICD-10-CM

## 2014-02-27 DIAGNOSIS — E1151 Type 2 diabetes mellitus with diabetic peripheral angiopathy without gangrene: Secondary | ICD-10-CM | POA: Diagnosis not present

## 2014-02-27 DIAGNOSIS — Z9861 Coronary angioplasty status: Secondary | ICD-10-CM | POA: Diagnosis not present

## 2014-02-27 DIAGNOSIS — E782 Mixed hyperlipidemia: Secondary | ICD-10-CM | POA: Diagnosis not present

## 2014-02-27 DIAGNOSIS — Z6832 Body mass index (BMI) 32.0-32.9, adult: Secondary | ICD-10-CM | POA: Diagnosis not present

## 2014-02-27 DIAGNOSIS — I10 Essential (primary) hypertension: Secondary | ICD-10-CM | POA: Diagnosis not present

## 2014-02-27 DIAGNOSIS — Z1389 Encounter for screening for other disorder: Secondary | ICD-10-CM | POA: Diagnosis not present

## 2014-03-26 ENCOUNTER — Ambulatory Visit
Admission: RE | Admit: 2014-03-26 | Discharge: 2014-03-26 | Disposition: A | Discharge status: Home / Self Care | Payer: Medicare Other | Source: Ambulatory Visit | Attending: Internal Medicine | Admitting: Internal Medicine

## 2014-03-26 ENCOUNTER — Encounter (INDEPENDENT_AMBULATORY_CARE_PROVIDER_SITE_OTHER): Payer: Self-pay

## 2014-03-26 DIAGNOSIS — Z1231 Encounter for screening mammogram for malignant neoplasm of breast: Secondary | ICD-10-CM | POA: Diagnosis not present

## 2014-03-31 DIAGNOSIS — E11349 Type 2 diabetes mellitus with severe nonproliferative diabetic retinopathy without macular edema: Secondary | ICD-10-CM | POA: Diagnosis not present

## 2014-03-31 DIAGNOSIS — H35362 Drusen (degenerative) of macula, left eye: Secondary | ICD-10-CM | POA: Diagnosis not present

## 2014-04-07 ENCOUNTER — Ambulatory Visit (INDEPENDENT_AMBULATORY_CARE_PROVIDER_SITE_OTHER): Payer: Medicare Other | Admitting: Cardiovascular Disease

## 2014-04-07 ENCOUNTER — Encounter: Payer: Self-pay | Admitting: Cardiovascular Disease

## 2014-04-07 VITALS — BP 138/68 | HR 76 | Ht 66.0 in | Wt 203.8 lb

## 2014-04-07 DIAGNOSIS — E119 Type 2 diabetes mellitus without complications: Secondary | ICD-10-CM | POA: Diagnosis not present

## 2014-04-07 DIAGNOSIS — I2510 Atherosclerotic heart disease of native coronary artery without angina pectoris: Secondary | ICD-10-CM | POA: Diagnosis not present

## 2014-04-07 DIAGNOSIS — Z794 Long term (current) use of insulin: Secondary | ICD-10-CM

## 2014-04-07 DIAGNOSIS — I10 Essential (primary) hypertension: Secondary | ICD-10-CM

## 2014-04-07 DIAGNOSIS — IMO0001 Reserved for inherently not codable concepts without codable children: Secondary | ICD-10-CM

## 2014-04-07 NOTE — Progress Notes (Signed)
Patient ID: Tonya Hunter, female   DOB: 1934-12-17, 79 y.o.   MRN: [id number] 79 yo patient previously seen by Dr Lia Foyer Stent to LAD in 2007 CRF;s elevated cholesterol and HTN. Last cath in 2007 stent patent EF has been ok. IDDM and not on ACE Hard to afford zetia due to donut whole. Sets her back $400. No chest pain Needs labs No recent lipid thyroid or A1c. Sees Dr [name] as primary. Has both knees replaced and ambulating well. Dr [name] does her ortho work. Discussed staying on DAT which TS suggested due to her restenosis.   No chest pain Compliant with meds Horrible BS control A1c over 10 Seeing Dr [name] and insulin being adjusted  Now on LA at night and meal coverage She has a hard time with her diet Vision worse and seeing [facility] eye exam ok   Stress about daughter who has chronic Lyme disease     ROS: Denies fever, malais, weight loss, blurry vision, decreased visual acuity, cough, sputum, SOB, hemoptysis, pleuritic pain, palpitaitons, heartburn, abdominal pain, melena, lower extremity edema, claudication, or rash.  All other systems reviewed and negative  General: Affect appropriate Healthy:  appears stated age 22: normal Neck supple with no adenopathy JVP normal no bruits no thyromegaly Lungs clear with no wheezing and good diaphragmatic motion Heart:  S1/S2 no murmur, no rub, gallop or click PMI normal Abdomen: benighn, BS positve, no tenderness, no AAA no bruit.  No HSM or HJR Distal pulses intact with no bruits No edema Neuro non-focal Skin warm and dry No muscular weakness   Current Outpatient Prescriptions  Medication Sig Dispense Refill  . aspirin 81 MG EC tablet Take 325 mg by mouth daily.     . clopidogrel (PLAVIX) 75 MG tablet TAKE 1 TABLET BY MOUTH DAILY 90 tablet 0  . furosemide (LASIX) 40 MG tablet TAKE 1/2 (HALF) TAB QD    . insulin glargine (LANTUS) 100 UNIT/ML injection Inject 50 Units into the skin at bedtime.    . insulin lispro protamine-lispro  (HUMALOG 75/25 MIX) (75-25) 100 UNIT/ML SUSP injection Inject 30 Units into the skin 3 (three) times daily.     Marland Kitchen levothyroxine (SYNTHROID, LEVOTHROID) 200 MCG tablet Take 200 mcg by mouth daily.      . metFORMIN (GLUCOPHAGE) 500 MG tablet 2 in the morning 2 in the evening.    . metoprolol (LOPRESSOR) 50 MG tablet Take 1 tablet (50 mg total) by mouth 2 (two) times daily. 180 tablet 2  . nitroGLYCERIN (NITROSTAT) 0.4 MG SL tablet TAKE 1 TABLET UNDER TONGUE AT ONSET OF CHEST PAIN; MAY REPEAT IN 5 MIN FOR UP TO 3 DOSES. 25 tablet 2  . potassium chloride (K-DUR,KLOR-CON) 10 MEQ tablet Take 1 tablet by mouth daily.    . simvastatin (ZOCOR) 20 MG tablet Take 1 tablet by mouth daily.    Marland Kitchen ZETIA 10 MG tablet Take 1 tablet by mouth daily.     No current facility-administered medications for this visit.    Allergies  Meperidine hcl and Penicillins  Electrocardiogram: 08/14/13  SR rate 61 RBBB   Assessment and Plan

## 2014-04-07 NOTE — Assessment & Plan Note (Signed)
F/U Tonya Hunter  Discussed low carb diet.  Target hemoglobin A1c is 6.5 or less.  Continue current medications.

## 2014-04-07 NOTE — Patient Instructions (Signed)
Your physician wants you to follow-up in: YEAR WITH DR NISHAN  You will receive a reminder letter in the mail two months in advance. If you don't receive a letter, please call our office to schedule the follow-up appointment.  Your physician recommends that you continue on your current medications as directed. Please refer to the Current Medication list given to you today. 

## 2014-04-07 NOTE — Assessment & Plan Note (Signed)
Stable with no angina and good activity level.  Continue medical Rx  

## 2014-04-07 NOTE — Assessment & Plan Note (Signed)
Well controlled.  Continue current medications and low sodium Dash type diet.    

## 2014-05-01 DIAGNOSIS — L920 Granuloma annulare: Secondary | ICD-10-CM | POA: Diagnosis not present

## 2014-05-01 DIAGNOSIS — L570 Actinic keratosis: Secondary | ICD-10-CM | POA: Diagnosis not present

## 2014-05-01 DIAGNOSIS — Z85828 Personal history of other malignant neoplasm of skin: Secondary | ICD-10-CM | POA: Diagnosis not present

## 2014-05-01 DIAGNOSIS — L578 Other skin changes due to chronic exposure to nonionizing radiation: Secondary | ICD-10-CM | POA: Diagnosis not present

## 2014-05-01 DIAGNOSIS — L821 Other seborrheic keratosis: Secondary | ICD-10-CM | POA: Diagnosis not present

## 2014-06-16 ENCOUNTER — Inpatient Hospital Stay (HOSPITAL_COMMUNITY)
Admission: EM | Admit: 2014-06-16 | Discharge: 2014-06-18 | DRG: 379 | Disposition: A | Discharge status: Home / Self Care | Payer: Medicare Other | Attending: Internal Medicine | Admitting: Internal Medicine

## 2014-06-16 ENCOUNTER — Encounter (HOSPITAL_COMMUNITY): Payer: Self-pay | Admitting: Internal Medicine

## 2014-06-16 ENCOUNTER — Emergency Department (HOSPITAL_COMMUNITY): Payer: Medicare Other

## 2014-06-16 DIAGNOSIS — M1990 Unspecified osteoarthritis, unspecified site: Secondary | ICD-10-CM | POA: Diagnosis present

## 2014-06-16 DIAGNOSIS — I252 Old myocardial infarction: Secondary | ICD-10-CM

## 2014-06-16 DIAGNOSIS — K2960 Other gastritis without bleeding: Secondary | ICD-10-CM | POA: Diagnosis present

## 2014-06-16 DIAGNOSIS — Z90710 Acquired absence of both cervix and uterus: Secondary | ICD-10-CM | POA: Diagnosis not present

## 2014-06-16 DIAGNOSIS — Z9049 Acquired absence of other specified parts of digestive tract: Secondary | ICD-10-CM | POA: Diagnosis present

## 2014-06-16 DIAGNOSIS — Z9861 Coronary angioplasty status: Secondary | ICD-10-CM | POA: Diagnosis not present

## 2014-06-16 DIAGNOSIS — Z9849 Cataract extraction status, unspecified eye: Secondary | ICD-10-CM | POA: Diagnosis not present

## 2014-06-16 DIAGNOSIS — K222 Esophageal obstruction: Secondary | ICD-10-CM | POA: Diagnosis present

## 2014-06-16 DIAGNOSIS — I10 Essential (primary) hypertension: Secondary | ICD-10-CM | POA: Diagnosis present

## 2014-06-16 DIAGNOSIS — I2510 Atherosclerotic heart disease of native coronary artery without angina pectoris: Secondary | ICD-10-CM | POA: Diagnosis present

## 2014-06-16 DIAGNOSIS — Z7901 Long term (current) use of anticoagulants: Secondary | ICD-10-CM

## 2014-06-16 DIAGNOSIS — K922 Gastrointestinal hemorrhage, unspecified: Secondary | ICD-10-CM | POA: Diagnosis present

## 2014-06-16 DIAGNOSIS — E785 Hyperlipidemia, unspecified: Secondary | ICD-10-CM | POA: Diagnosis present

## 2014-06-16 DIAGNOSIS — Z880 Allergy status to penicillin: Secondary | ICD-10-CM

## 2014-06-16 DIAGNOSIS — Z7982 Long term (current) use of aspirin: Secondary | ICD-10-CM

## 2014-06-16 DIAGNOSIS — Z888 Allergy status to other drugs, medicaments and biological substances status: Secondary | ICD-10-CM | POA: Diagnosis not present

## 2014-06-16 DIAGNOSIS — Z96652 Presence of left artificial knee joint: Secondary | ICD-10-CM | POA: Diagnosis present

## 2014-06-16 DIAGNOSIS — Z7902 Long term (current) use of antithrombotics/antiplatelets: Secondary | ICD-10-CM

## 2014-06-16 DIAGNOSIS — E119 Type 2 diabetes mellitus without complications: Secondary | ICD-10-CM

## 2014-06-16 DIAGNOSIS — K920 Hematemesis: Secondary | ICD-10-CM | POA: Diagnosis present

## 2014-06-16 DIAGNOSIS — E039 Hypothyroidism, unspecified: Secondary | ICD-10-CM | POA: Diagnosis present

## 2014-06-16 DIAGNOSIS — R404 Transient alteration of awareness: Secondary | ICD-10-CM | POA: Diagnosis not present

## 2014-06-16 DIAGNOSIS — E1159 Type 2 diabetes mellitus with other circulatory complications: Secondary | ICD-10-CM | POA: Diagnosis not present

## 2014-06-16 DIAGNOSIS — Z955 Presence of coronary angioplasty implant and graft: Secondary | ICD-10-CM

## 2014-06-16 DIAGNOSIS — R42 Dizziness and giddiness: Secondary | ICD-10-CM | POA: Diagnosis not present

## 2014-06-16 DIAGNOSIS — Z794 Long term (current) use of insulin: Secondary | ICD-10-CM | POA: Diagnosis not present

## 2014-06-16 LAB — URINALYSIS, ROUTINE W REFLEX MICROSCOPIC
Bilirubin Urine: NEGATIVE
Glucose, UA: >1000 mg/dL — AB
HGB URINE DIPSTICK: NEGATIVE
Ketones, ur: 15 mg/dL — AB
Nitrite: NEGATIVE
PROTEIN: NEGATIVE mg/dL
Specific Gravity, Urine: 1.029 (ref 1.005–1.030)
Urobilinogen, UA: 1 mg/dL (ref 0.0–1.0)
pH: 5 (ref 5.0–8.0)

## 2014-06-16 LAB — COMPREHENSIVE METABOLIC PANEL
ALT: 16 U/L (ref 0–35)
ANION GAP: 11 (ref 5–15)
AST: 28 U/L (ref 0–37)
Albumin: 3 g/dL — ABNORMAL LOW (ref 3.5–5.2)
Alkaline Phosphatase: 123 U/L — ABNORMAL HIGH (ref 39–117)
BUN: 41 mg/dL — ABNORMAL HIGH (ref 6–23)
CALCIUM: 9.2 mg/dL (ref 8.4–10.5)
CHLORIDE: 103 mmol/L (ref 96–112)
CO2: 22 mmol/L (ref 19–32)
CREATININE: 0.92 mg/dL (ref 0.50–1.10)
GFR calc non Af Amer: 59 mL/min — ABNORMAL LOW (ref >90)
GFR, EST AFRICAN AMERICAN: 68 mL/min — AB (ref >90)
Glucose, Bld: 315 mg/dL — ABNORMAL HIGH (ref 70–99)
Potassium: 4.1 mmol/L (ref 3.5–5.1)
Sodium: 136 mmol/L (ref 135–145)
Total Bilirubin: 0.8 mg/dL (ref 0.3–1.2)
Total Protein: 6 g/dL (ref 6.0–8.3)

## 2014-06-16 LAB — CBC WITH DIFFERENTIAL/PLATELET
Basophils Absolute: 0 10*3/uL (ref 0.0–0.1)
Basophils Relative: 0 % (ref 0–1)
Eosinophils Absolute: 0.1 10*3/uL (ref 0.0–0.7)
Eosinophils Relative: 1 % (ref 0–5)
HCT: 30.3 % — ABNORMAL LOW (ref 36.0–46.0)
Hemoglobin: 10.2 g/dL — ABNORMAL LOW (ref 12.0–15.0)
LYMPHS PCT: 34 % (ref 12–46)
Lymphs Abs: 2.9 10*3/uL (ref 0.7–4.0)
MCH: 29.5 pg (ref 26.0–34.0)
MCHC: 33.7 g/dL (ref 30.0–36.0)
MCV: 87.6 fL (ref 78.0–100.0)
Monocytes Absolute: 0.7 10*3/uL (ref 0.1–1.0)
Monocytes Relative: 9 % (ref 3–12)
NEUTROS ABS: 4.8 10*3/uL (ref 1.7–7.7)
NEUTROS PCT: 56 % (ref 43–77)
PLATELETS: 206 10*3/uL (ref 150–400)
RBC: 3.46 MIL/uL — AB (ref 3.87–5.11)
RDW: 12.8 % (ref 11.5–15.5)
WBC: 8.6 10*3/uL (ref 4.0–10.5)

## 2014-06-16 LAB — PROTIME-INR
INR: 1.31 (ref 0.00–1.49)
Prothrombin Time: 16.4 seconds — ABNORMAL HIGH (ref 11.6–15.2)

## 2014-06-16 LAB — GLUCOSE, CAPILLARY: Glucose-Capillary: 218 mg/dL — ABNORMAL HIGH (ref 70–99)

## 2014-06-16 LAB — I-STAT TROPONIN, ED: Troponin i, poc: 0.01 ng/mL (ref 0.00–0.08)

## 2014-06-16 LAB — URINE MICROSCOPIC-ADD ON

## 2014-06-16 LAB — TYPE AND SCREEN
ABO/RH(D): O POS
Antibody Screen: NEGATIVE

## 2014-06-16 LAB — POC OCCULT BLOOD, ED: FECAL OCCULT BLD: NEGATIVE

## 2014-06-16 LAB — APTT: aPTT: 37 seconds (ref 24–37)

## 2014-06-16 LAB — LIPASE, BLOOD: LIPASE: 67 U/L — AB (ref 11–59)

## 2014-06-16 MED ORDER — INSULIN GLARGINE 100 UNIT/ML ~~LOC~~ SOLN
25.0000 [IU] | Freq: Every day | SUBCUTANEOUS | Status: DC
  Administered 2014-06-17: 25 [IU] via SUBCUTANEOUS
  Filled 2014-06-16 (×3): qty 0.25

## 2014-06-16 MED ORDER — ALUM & MAG HYDROXIDE-SIMETH 200-200-20 MG/5ML PO SUSP: 30.0000 mL | Freq: Four times a day (QID) | ORAL | Status: DC | PRN

## 2014-06-16 MED ORDER — SODIUM CHLORIDE 0.9 % IV SOLN
INTRAVENOUS | Status: DC
  Administered 2014-06-17: via INTRAVENOUS

## 2014-06-16 MED ORDER — ONDANSETRON HCL 4 MG/2ML IJ SOLN
4.0000 mg | Freq: Once | INTRAMUSCULAR | Status: AC
  Administered 2014-06-16: 4 mg via INTRAVENOUS
  Filled 2014-06-16: qty 2

## 2014-06-16 MED ORDER — ACETAMINOPHEN 650 MG RE SUPP: 650.0000 mg | Freq: Four times a day (QID) | RECTAL | Status: DC | PRN

## 2014-06-16 MED ORDER — INSULIN ASPART 100 UNIT/ML ~~LOC~~ SOLN
0.0000 [IU] | SUBCUTANEOUS | Status: DC
  Administered 2014-06-17 (×2): 5 [IU] via SUBCUTANEOUS
  Administered 2014-06-17: 2 [IU] via SUBCUTANEOUS
  Administered 2014-06-17: 3 [IU] via SUBCUTANEOUS
  Administered 2014-06-17: 2 [IU] via SUBCUTANEOUS
  Administered 2014-06-17: 5 [IU] via SUBCUTANEOUS
  Administered 2014-06-18: 3 [IU] via SUBCUTANEOUS
  Administered 2014-06-18: 2 [IU] via SUBCUTANEOUS

## 2014-06-16 MED ORDER — HYDROCODONE-ACETAMINOPHEN 5-325 MG PO TABS: 1.0000 | ORAL_TABLET | ORAL | Status: DC | PRN

## 2014-06-16 MED ORDER — LEVOTHYROXINE SODIUM 200 MCG PO TABS
200.0000 ug | ORAL_TABLET | Freq: Every day | ORAL | Status: DC
  Administered 2014-06-18: 200 ug via ORAL
  Filled 2014-06-16 (×3): qty 1

## 2014-06-16 MED ORDER — METOPROLOL TARTRATE 50 MG PO TABS
50.0000 mg | ORAL_TABLET | Freq: Two times a day (BID) | ORAL | Status: DC
  Administered 2014-06-17: 50 mg via ORAL
  Filled 2014-06-16 (×5): qty 1

## 2014-06-16 MED ORDER — SODIUM CHLORIDE 0.9 % IV SOLN
8.0000 mg/h | INTRAVENOUS | Status: DC
  Administered 2014-06-16 – 2014-06-17 (×2): 8 mg/h via INTRAVENOUS
  Filled 2014-06-16 (×4): qty 80

## 2014-06-16 MED ORDER — ACETAMINOPHEN 325 MG PO TABS: 650.0000 mg | ORAL_TABLET | Freq: Four times a day (QID) | ORAL | Status: DC | PRN

## 2014-06-16 MED ORDER — ONDANSETRON HCL 4 MG PO TABS: 4.0000 mg | ORAL_TABLET | Freq: Four times a day (QID) | ORAL | Status: DC | PRN

## 2014-06-16 MED ORDER — SODIUM CHLORIDE 0.9 % IV SOLN
80.0000 mg | Freq: Once | INTRAVENOUS | Status: AC
  Administered 2014-06-16: 80 mg via INTRAVENOUS
  Filled 2014-06-16: qty 80

## 2014-06-16 MED ORDER — ONDANSETRON HCL 4 MG/2ML IJ SOLN
4.0000 mg | Freq: Four times a day (QID) | INTRAMUSCULAR | Status: DC | PRN
  Administered 2014-06-17: 4 mg via INTRAVENOUS
  Filled 2014-06-16: qty 2

## 2014-06-16 MED ORDER — SIMVASTATIN 20 MG PO TABS
20.0000 mg | ORAL_TABLET | Freq: Every day | ORAL | Status: DC
  Administered 2014-06-17: 20 mg via ORAL
  Filled 2014-06-16 (×2): qty 1

## 2014-06-16 NOTE — H&P (Signed)
PCP:   [name], MD   Chief Complaint:  vomitting blood  HPI: Tonya Hunter is a 80 year old white female with a history of coronary artery disease status post PCI with drug-eluting stent (2007) with MI after TKR (2012) on ongoing aspirin and Plavix, diabetes mellitus on insulin therapy who presented to the emergency department with complaint of vomiting blood. The patient states that she had an episode of black emesis at 1 PM. Several hours later, she had a recurrent episode of vomiting with dark black blood clots. She states that she was feeling well prior to the onset of vomiting other than mild anorexia. No other preceding symptoms. Mild lower abdominal discomfort but no cramping, diarrhea, or blood in her stools. Last bowel movement was 2 days ago.  Other than her aspirin and Plavix she is not on any anticoagulants, NSAIDs.  She denies any alcohol intake. Endorses significant family stress. Due to her vomiting of blood she called EMS. En route she had an additional episode of black emesis. In the emergency department her hemoglobin is 10.2, BUN 41. She is Hemoccult negative. She is hemodynamically stable with a blood pressure of 129/56 with heart rate of 86 on presentation. She was started on continuous Protonix drip in the emergency department and we were consulted for admission. GI was also called while she was in the emergency department due to concern of active UGI bleed.    Review of Systems:  Review of Systems - All systems reviewed with the patient and are negative except in history of present illness. Past Medical History: Past Medical History  Diagnosis Date  . Coronary artery disease 09/2005    s/p TAXUS DRUG-ELUTING STENT PLACEMENT TO THE LEFT ANTERIOR DESCENDING ARTERY  . Hypertension   . Diabetes mellitus     TYPE II  . Hyperlipidemia   . Osteoarthrosis, unspecified whether generalized or localized, unspecified site   . Thyroid disease     HYPOTHYROIDISM  . History of  hysterectomy    Coronary disease s/p taxus drug-eluting stent to LAD (2007) Osteoarthritis Left shoulder fracture Carpal tunnel syndrome dm2 MI s/p Left TKR 2012-cath   Past Surgical History  Procedure Laterality Date  . Median nerve repair  2009    DECOMPRESSION...RIGHT AND LEFT DECOMPRESSION  . Appendectomy    . Cataract extraction    . Cholecystectomy    . Hernia repair    . Knee surgery    . Coronary stent placement  09/2005    PLACEMENT TO LEFT ANTERIOR DESCENDING ARTERY   Right total knee (2008) Hysterectomy Cholecystectomy Hernia repair left tkr 2012 cath/PCI 2012   Medications: Prior to Admission medications   Medication Sig Start Date End Date Taking? Authorizing Provider  aspirin 325 MG EC tablet Take 325 mg by mouth daily.   Yes Historical Provider, MD  clopidogrel (PLAVIX) 75 MG tablet TAKE 1 TABLET BY MOUTH DAILY   Yes Josue Hector, MD  furosemide (LASIX) 40 MG tablet TAKE 1/2 (HALF) TAB QD   Yes Historical Provider, MD  hydrocortisone valerate cream (WESTCORT) 0.2 % Apply 1 application topically as needed. For skin iritation 05/05/14  Yes Historical Provider, MD  insulin glargine (LANTUS) 100 UNIT/ML injection Inject 50 Units into the skin at bedtime.   Yes Historical Provider, MD  insulin lispro protamine-lispro (HUMALOG 75/25 MIX) (75-25) 100 UNIT/ML SUSP injection Inject 10 Units into the skin 3 (three) times daily.    Yes Historical Provider, MD  levothyroxine (SYNTHROID, LEVOTHROID) 200 MCG tablet  Take 200 mcg by mouth daily.     Yes Historical Provider, MD  metFORMIN (GLUCOPHAGE) 500 MG tablet Take 1,000 mg by mouth 2 (two) times daily with a meal. 2 in the morning 2 in the evening. 06/04/12  Yes Historical Provider, MD  metoprolol (LOPRESSOR) 50 MG tablet Take 1 tablet (50 mg total) by mouth 2 (two) times daily. 07/28/11  Yes Hillary Bow, MD  potassium chloride (K-DUR,KLOR-CON) 10 MEQ tablet Take 1 tablet by mouth daily. 12/16/11  Yes Historical  Provider, MD  simvastatin (ZOCOR) 20 MG tablet Take 1 tablet by mouth daily at 6 PM.  12/24/11  Yes Historical Provider, MD  nitroGLYCERIN (NITROSTAT) 0.4 MG SL tablet TAKE 1 TABLET UNDER TONGUE AT ONSET OF CHEST PAIN; MAY REPEAT IN 5 MIN FOR UP TO 3 DOSES. 05/23/11   Blane Ohara, MD    Allergies:   Allergies  Allergen Reactions  . Penicillins Other (See Comments)    weakness  . Meperidine Hcl Other (See Comments)    unknown    Social History: She is married.  She has two children.  She is an Web designer at [employer].  She does not drink or smoke.   Family History: Family History  Problem Relation Age of Onset  . Diabetes    . Hypertension      Physical Exam: Filed Vitals:   06/16/14 2101 06/16/14 2115 06/16/14 2145 06/16/14 2215  BP: 129/56 124/70 127/44 115/51  Pulse: 86 92 89 89  Temp: 98.3 F (36.8 C)     TempSrc: Oral     Resp: 18 17 20 16   Height: 5' 6"  (1.676 m)     Weight: 88.905 kg (196 lb)     SpO2: 95% 94% 96% 97%   General appearance: alert and no distress Head: Normocephalic, without obvious abnormality, atraumatic Eyes: conjunctivae/corneas clear. PERRL, EOM's intact.  Nose: Nares normal. Septum midline. Mucosa normal. No drainage or sinus tenderness. Throat: lips, mucosa, and tongue normal; teeth and gums normal Neck: no adenopathy, no carotid bruit, no JVD and thyroid not enlarged, symmetric, no tenderness/mass/nodules Resp: clear to auscultation bilaterally Cardio: regular rate and rhythm GI: soft, non-tender; bowel sounds normal; no masses,  no organomegaly Extremities: extremities normal, atraumatic, no cyanosis or edema Pulses: 2+ and symmetric Lymph nodes: no cervical lymphadenopathy Neurologic: Alert and oriented X 3, normal strength and tone. Normal symmetric reflexes.     Labs on Admission:   Recent Labs  06/16/14 2104  NA 136  K 4.1  CL 103  CO2 22  GLUCOSE 315*  BUN 41*  CREATININE 0.92  CALCIUM 9.2     Recent Labs  06/16/14 2104  AST 28  ALT 16  ALKPHOS 123*  BILITOT 0.8  PROT 6.0  ALBUMIN 3.0*    Recent Labs  06/16/14 2104  LIPASE 67*    Recent Labs  06/16/14 2104  WBC 8.6  NEUTROABS 4.8  HGB 10.2*  HCT 30.3*  MCV 87.6  PLT 206   No results for input(s): CKTOTAL, CKMB, CKMBINDEX, TROPONINI in the last 72 hours. Lab Results  Component Value Date   INR 1.31 06/16/2014   INR 0.94 05/10/2010   INR 1.06 05/09/2010   No results for input(s): TSH, T4TOTAL, T3FREE, THYROIDAB in the last 72 hours.  Invalid input(s): FREET3 No results for input(s): VITAMINB12, FOLATE, FERRITIN, TIBC, IRON, RETICCTPCT in the last 72 hours.  Radiological Exams on Admission: Dg Chest Portable 1 View  06/16/2014   CLINICAL DATA:  Acute onset of hematemesis.  Initial encounter.  EXAM: PORTABLE CHEST - 1 VIEW  COMPARISON:  Chest radiograph performed 05/09/2010  FINDINGS: The lungs are well-aerated and clear. There is no evidence of focal opacification, pleural effusion or pneumothorax.  The cardiomediastinal silhouette is within normal limits. No acute osseous abnormalities are seen. There is nonspecific lucency within the humeral heads, more prominent on the left, which appears grossly stable from 2008.  IMPRESSION: No acute cardiopulmonary process seen.   Electronically Signed   By: Garald Balding M.D.   On: 06/16/2014 21:31   Orders placed or performed during the hospital encounter of 06/16/14  . ED EKG   (only if pt is 78 y.o. or older and pain is above umbilicus)  . ED EKG   (only if pt is 77 y.o. or older and pain is above umbilicus)  . EKG 12-Lead  . EKG 12-Lead    Assessment/Plan Principal Problem: 1. Upper gastrointestinal bleed- history is concerning for an acute upper GI bleed with recurrent black hematemesis, elevated BUN, and anemia. Admit to a medical bed for close monitoring of vitals, hemoglobin, and GI evaluation. Continue continuous Protonix drip.  GI was consult it in  the emergency department. We'll keep her nothing by mouth for anticipated EGD. She has been typed and screened. We'll transfuse if hemoglobin drops below 8.0 on serial hemoglobins. Active Problems: 2. IDDM (insulin dependent diabetes mellitus)-will provide one half of her home dose of Lantus and cover with sliding-scale insulin while nothing by mouth. 3. Essential hypertension-we'll continue her metoprolol and hold Lasix. Hold metoprolol if blood pressure below 100/50. 4. Coronary artery disease status post PCI with drug-eluting stent and prior MI-she is at increased risk for recurrent in-stent thrombosis while off Plavix. However, at this point, risk of continuing antiplatelet therapy exceeds benefit. We'll resume when bleeding resolves per GI recommendations.  Marton Redwood 06/16/2014, 10:57 PM

## 2014-06-16 NOTE — ED Provider Notes (Signed)
CSN: [id number]     Arrival date & time 06/16/14  2055 History   First MD Initiated Contact with Patient 06/16/14 2057     Chief Complaint  Patient presents with  . Hematemesis  . Dizziness     (Consider location/radiation/quality/duration/timing/severity/associated sxs/prior Treatment) HPI  80 year old female past history of CAD status post stent, diabetes, hypertension, thyroid disease who presents to ED complaining of several bouts of coffee-ground emesis this evening. Patient states onset was acute. She denies any abdominal pain prior to this. She has no history of peptic ulcer disease. Patient states she vomited approximately 5 or 6 times. She states she called her PCP on call who advised her to go to the ED for evaluation of ulcer. Patient denies any dark or bloody stools. She has been previously in her normal state of health without recent illness. Patient reports she has felt lightheaded which is made worse upon sitting up or standing. Denies syncope. Patient does not take any NSAIDs. She is on an aspirin and Plavix. Denies fever, URI symptoms, nosebleed, use of Pepto-Bismol or iron tablets.     Past Medical History  Diagnosis Date  . Coronary artery disease 09/2005    s/p TAXUS DRUG-ELUTING STENT PLACEMENT TO THE LEFT ANTERIOR DESCENDING ARTERY  . Hypertension   . Diabetes mellitus     TYPE II  . Hyperlipidemia   . Osteoarthrosis, unspecified whether generalized or localized, unspecified site   . Thyroid disease     HYPOTHYROIDISM  . History of hysterectomy    Past Surgical History  Procedure Laterality Date  . Median nerve repair  2009    DECOMPRESSION...RIGHT AND LEFT DECOMPRESSION  . Appendectomy    . Cataract extraction    . Cholecystectomy    . Hernia repair    . Knee surgery    . Coronary stent placement  09/2005    PLACEMENT TO LEFT ANTERIOR DESCENDING ARTERY   Family History  Problem Relation Age of Onset  . Diabetes    . Hypertension     History   Substance Use Topics  . Smoking status: Never Smoker   . Smokeless tobacco: Not on file  . Alcohol Use: No   OB History    No data available     Review of Systems  Constitutional: Negative for fever and chills.  HENT: Negative for congestion, rhinorrhea and sore throat.   Eyes: Negative for visual disturbance.  Respiratory: Negative for cough and shortness of breath.   Cardiovascular: Negative for chest pain, palpitations and leg swelling.  Gastrointestinal: Positive for vomiting. Negative for nausea, abdominal pain, diarrhea, constipation, blood in stool, abdominal distention, anal bleeding and rectal pain.  Genitourinary: Negative for dysuria, hematuria, vaginal bleeding and vaginal discharge.  Musculoskeletal: Negative for back pain and neck pain.  Skin: Negative for rash.  Neurological: Positive for light-headedness. Negative for weakness and headaches.  All other systems reviewed and are negative.     Allergies  Meperidine hcl and Penicillins  Home Medications   Prior to Admission medications   Medication Sig Start Date End Date Taking? Authorizing Provider  aspirin 81 MG EC tablet Take 325 mg by mouth daily.  08/24/10   Hillary Bow, MD  clopidogrel (PLAVIX) 75 MG tablet TAKE 1 TABLET BY MOUTH DAILY    Josue Hector, MD  furosemide (LASIX) 40 MG tablet TAKE 1/2 (HALF) TAB QD    Historical Provider, MD  insulin glargine (LANTUS) 100 UNIT/ML injection Inject 50 Units into the skin at  bedtime.    Historical Provider, MD  insulin lispro protamine-lispro (HUMALOG 75/25 MIX) (75-25) 100 UNIT/ML SUSP injection Inject 30 Units into the skin 3 (three) times daily.     Historical Provider, MD  levothyroxine (SYNTHROID, LEVOTHROID) 200 MCG tablet Take 200 mcg by mouth daily.      Historical Provider, MD  metFORMIN (GLUCOPHAGE) 500 MG tablet 2 in the morning 2 in the evening. 06/04/12   Historical Provider, MD  metoprolol (LOPRESSOR) 50 MG tablet Take 1 tablet (50 mg total) by  mouth 2 (two) times daily. 07/28/11   Hillary Bow, MD  nitroGLYCERIN (NITROSTAT) 0.4 MG SL tablet TAKE 1 TABLET UNDER TONGUE AT ONSET OF CHEST PAIN; MAY REPEAT IN 5 MIN FOR UP TO 3 DOSES. 05/23/11   Blane Ohara, MD  potassium chloride (K-DUR,KLOR-CON) 10 MEQ tablet Take 1 tablet by mouth daily. 12/16/11   Historical Provider, MD  simvastatin (ZOCOR) 20 MG tablet Take 1 tablet by mouth daily. 12/24/11   Historical Provider, MD  ZETIA 10 MG tablet Take 1 tablet by mouth daily. 12/24/11   Historical Provider, MD   BP 129/56 mmHg  Pulse 86  Temp(Src) 98.3 F (36.8 C) (Oral)  Resp 18  Ht 5' 6"  (1.676 m)  Wt 196 lb (88.905 kg)  BMI 31.65 kg/m2  SpO2 95% Physical Exam  Constitutional: She is oriented to person, place, and time. She appears well-developed and well-nourished. No distress.  HENT:  Head: Normocephalic and atraumatic.  Eyes: Conjunctivae are normal.  Neck: Normal range of motion.  Cardiovascular: Normal rate, regular rhythm, normal heart sounds and intact distal pulses.   No murmur heard. Pulmonary/Chest: Effort normal and breath sounds normal. No respiratory distress. She has no wheezes. She has no rales. She exhibits no tenderness.  Abdominal: Soft. Bowel sounds are normal. She exhibits no distension and no mass. There is no tenderness. There is no rebound and no guarding.  Musculoskeletal: Normal range of motion.  Neurological: She is alert and oriented to person, place, and time. No cranial nerve deficit.  Skin: Skin is warm and dry.  Psychiatric: She has a normal mood and affect.  Nursing note and vitals reviewed.   ED Course  Procedures (including critical care time) Labs Review Labs Reviewed  CBC WITH DIFFERENTIAL/PLATELET - Abnormal; Notable for the following:    RBC 3.46 (*)    Hemoglobin 10.2 (*)    HCT 30.3 (*)    All other components within normal limits  COMPREHENSIVE METABOLIC PANEL - Abnormal; Notable for the following:    Glucose, Bld 315 (*)    BUN  41 (*)    Albumin 3.0 (*)    Alkaline Phosphatase 123 (*)    GFR calc non Af Amer 59 (*)    GFR calc Af Amer 68 (*)    All other components within normal limits  LIPASE, BLOOD - Abnormal; Notable for the following:    Lipase 67 (*)    All other components within normal limits  URINALYSIS, ROUTINE W REFLEX MICROSCOPIC - Abnormal; Notable for the following:    Glucose, UA >1000 (*)    Ketones, ur 15 (*)    Leukocytes, UA LARGE (*)    All other components within normal limits  PROTIME-INR - Abnormal; Notable for the following:    Prothrombin Time 16.4 (*)    All other components within normal limits  URINE MICROSCOPIC-ADD ON - Abnormal; Notable for the following:    Squamous Epithelial / LPF FEW (*)  Casts HYALINE CASTS (*)    All other components within normal limits  URINE CULTURE  APTT  PROTIME-INR  HEMOGLOBIN  BASIC METABOLIC PANEL  CBC  I-STAT TROPOININ, ED  POC OCCULT BLOOD, ED  TYPE AND SCREEN    Imaging Review Dg Chest Portable 1 View  06/16/2014   CLINICAL DATA:  Acute onset of hematemesis.  Initial encounter.  EXAM: PORTABLE CHEST - 1 VIEW  COMPARISON:  Chest radiograph performed 05/09/2010  FINDINGS: The lungs are well-aerated and clear. There is no evidence of focal opacification, pleural effusion or pneumothorax.  The cardiomediastinal silhouette is within normal limits. No acute osseous abnormalities are seen. There is nonspecific lucency within the humeral heads, more prominent on the left, which appears grossly stable from 2008.  IMPRESSION: No acute cardiopulmonary process seen.   Electronically Signed   By: Garald Balding M.D.   On: 06/16/2014 21:31     EKG Interpretation   Date/Time:  Monday June 16 2014 20:59:39 EST Ventricular Rate:  92 PR Interval:  147 QRS Duration: 146 QT Interval:  431 QTC Calculation: 533 R Axis:   73 Text Interpretation:  Sinus tachycardia Ventricular premature complex  Right bundle branch block No significant change since  last tracing  Confirmed by POLLINA  MD, CHRISTOPHER [phone]) on 06/16/2014 9:09:06 PM      MDM   Final diagnoses:  None    RAMATOULAYE PACK is a 80 y.o. female with H&P as above. Patient presents hemodynamically stable and in no apparent distress. Patient having coffee ground emesis prior to arrival. No history of ulcers. Suspect upper GI bleed. No signs of shock. Patient is mildly tachycardic into the 90s for patient who is on metoprolol. Patient has a benign abdominal exam. Will check CXR, screening labs and start patient on Protonix drip. Hgb 10 which is greater than normal. Elevated lipase is likely 2/2 UGIB and not pancreatitis as this is only mildly elevated and pt without abd pain. UA shows likely dirty catch. Pt admitted to Dr. Brigitte Pulse. GI was consulted who will see in AM. Remained stable in ED.  Pt seen in conjunction with Dr. Georgie Chard, [facility] Resident - PGY-2        Kirstie Peri, MD 06/17/14 0001  Orpah Greek, MD 06/18/14 [phone]

## 2014-06-16 NOTE — ED Notes (Signed)
Pt coming from home with c/o vomiting blood, approximately 300-400 mL, pt given 4 mg Zofran, nausea resolved, pt is on Plavix. Pt reports some dizziness, and minor abdominal pain. Pt A&Ox4, respirations equal and unlabored, skin warm and dry

## 2014-06-17 ENCOUNTER — Encounter (HOSPITAL_COMMUNITY): Payer: Self-pay | Admitting: *Deleted

## 2014-06-17 ENCOUNTER — Encounter (HOSPITAL_COMMUNITY)
Admission: EM | Disposition: A | Discharge status: Home / Self Care | Payer: Self-pay | Source: Home / Self Care | Attending: Internal Medicine

## 2014-06-17 HISTORY — PX: ESOPHAGOGASTRODUODENOSCOPY: SHX5428

## 2014-06-17 LAB — BASIC METABOLIC PANEL
ANION GAP: 10 (ref 5–15)
BUN: 37 mg/dL — ABNORMAL HIGH (ref 6–23)
CO2: 23 mmol/L (ref 19–32)
CREATININE: 0.92 mg/dL (ref 0.50–1.10)
Calcium: 9.1 mg/dL (ref 8.4–10.5)
Chloride: 107 mmol/L (ref 96–112)
GFR calc Af Amer: 68 mL/min — ABNORMAL LOW (ref >90)
GFR, EST NON AFRICAN AMERICAN: 59 mL/min — AB (ref >90)
Glucose, Bld: 144 mg/dL — ABNORMAL HIGH (ref 70–99)
POTASSIUM: 3.7 mmol/L (ref 3.5–5.1)
SODIUM: 140 mmol/L (ref 135–145)

## 2014-06-17 LAB — CBC
HCT: 27.9 % — ABNORMAL LOW (ref 36.0–46.0)
Hemoglobin: 9.5 g/dL — ABNORMAL LOW (ref 12.0–15.0)
MCH: 30.2 pg (ref 26.0–34.0)
MCHC: 34.1 g/dL (ref 30.0–36.0)
MCV: 88.6 fL (ref 78.0–100.0)
PLATELETS: 175 10*3/uL (ref 150–400)
RBC: 3.15 MIL/uL — ABNORMAL LOW (ref 3.87–5.11)
RDW: 12.9 % (ref 11.5–15.5)
WBC: 7.9 10*3/uL (ref 4.0–10.5)

## 2014-06-17 LAB — GLUCOSE, CAPILLARY
GLUCOSE-CAPILLARY: 154 mg/dL — AB (ref 70–99)
GLUCOSE-CAPILLARY: 225 mg/dL — AB (ref 70–99)
Glucose-Capillary: 133 mg/dL — ABNORMAL HIGH (ref 70–99)
Glucose-Capillary: 134 mg/dL — ABNORMAL HIGH (ref 70–99)
Glucose-Capillary: 136 mg/dL — ABNORMAL HIGH (ref 70–99)
Glucose-Capillary: 143 mg/dL — ABNORMAL HIGH (ref 70–99)
Glucose-Capillary: 229 mg/dL — ABNORMAL HIGH (ref 70–99)
Glucose-Capillary: 242 mg/dL — ABNORMAL HIGH (ref 70–99)

## 2014-06-17 LAB — PROTIME-INR
INR: 1.21 (ref 0.00–1.49)
PROTHROMBIN TIME: 15.4 s — AB (ref 11.6–15.2)

## 2014-06-17 LAB — ABO/RH: ABO/RH(D): O POS

## 2014-06-17 LAB — HEMOGLOBIN: Hemoglobin: 9.9 g/dL — ABNORMAL LOW (ref 12.0–15.0)

## 2014-06-17 SURGERY — EGD (ESOPHAGOGASTRODUODENOSCOPY)
Anesthesia: Moderate Sedation

## 2014-06-17 MED ORDER — DIPHENHYDRAMINE HCL 50 MG/ML IJ SOLN
INTRAMUSCULAR | Status: AC
  Filled 2014-06-17: qty 1

## 2014-06-17 MED ORDER — BUTAMBEN-TETRACAINE-BENZOCAINE 2-2-14 % EX AERO
INHALATION_SPRAY | CUTANEOUS | Status: DC | PRN
  Administered 2014-06-17: 2 via TOPICAL

## 2014-06-17 MED ORDER — SODIUM CHLORIDE 0.9 % IV SOLN
INTRAVENOUS | Status: DC
  Administered 2014-06-17: 09:00:00 via INTRAVENOUS

## 2014-06-17 MED ORDER — MIDAZOLAM HCL 5 MG/ML IJ SOLN
INTRAMUSCULAR | Status: AC
  Filled 2014-06-17: qty 2

## 2014-06-17 MED ORDER — NITROGLYCERIN 0.4 MG SL SUBL
0.4000 mg | SUBLINGUAL_TABLET | SUBLINGUAL | Status: DC | PRN
  Administered 2014-06-17: 0.4 mg via SUBLINGUAL
  Filled 2014-06-17: qty 1

## 2014-06-17 MED ORDER — MIDAZOLAM HCL 10 MG/2ML IJ SOLN
INTRAMUSCULAR | Status: DC | PRN
  Administered 2014-06-17: 2 mg via INTRAVENOUS
  Administered 2014-06-17: 1 mg via INTRAVENOUS

## 2014-06-17 MED ORDER — PANTOPRAZOLE SODIUM 40 MG PO TBEC
40.0000 mg | DELAYED_RELEASE_TABLET | Freq: Every day | ORAL | Status: DC
  Administered 2014-06-17 – 2014-06-18 (×3): 40 mg via ORAL
  Filled 2014-06-17 (×2): qty 1

## 2014-06-17 MED ORDER — FENTANYL CITRATE 0.05 MG/ML IJ SOLN
INTRAMUSCULAR | Status: DC | PRN
  Administered 2014-06-17: 25 ug via INTRAVENOUS

## 2014-06-17 MED ORDER — NITROGLYCERIN 0.4 MG/SPRAY TL SOLN: 1.0000 | Status: DC | PRN

## 2014-06-17 MED ORDER — FENTANYL CITRATE 0.05 MG/ML IJ SOLN
INTRAMUSCULAR | Status: AC
  Filled 2014-06-17: qty 2

## 2014-06-17 NOTE — Interval H&P Note (Signed)
History and Physical Interval Note:  06/17/2014 10:01 AM  Tonya Hunter  has presented today for surgery, with the diagnosis of Hematemesis  The various methods of treatment have been discussed with the patient and family. After consideration of risks, benefits and other options for treatment, the patient has consented to  Procedure(s): ESOPHAGOGASTRODUODENOSCOPY (EGD) (N/A) as a surgical intervention .  The patient's history has been reviewed, patient examined, no change in status, stable for surgery.  I have reviewed the patient's chart and labs.  Questions were answered to the patient's satisfaction.     [name] C

## 2014-06-17 NOTE — Progress Notes (Addendum)
GI addendum: EGD surprisingly relatively normal except for mild antral gastritis and a lower esophageal ring. Did not see any coffee grounds or older fresh blood anywhere in the upper GI tract. Would monitor stools hemoglobin and monitor for any further vomiting and advance diet as tolerated. If no Further bleeding would probably be reasonable to resume Plavix relatively soon. Would consider a proton pump inhibitor for 6 weeks for any possible missed lesions on this exam.

## 2014-06-17 NOTE — Op Note (Signed)
[facility] [place], [id number]   ENDOSCOPY PROCEDURE REPORT  PATIENT: Tonya Hunter, Tonya Hunter  MR#: [id number] BIRTHDATE: Nov 14, 1934 , 46  yrs. old GENDER: female ENDOSCOPIST: Teena Irani, MD REFERRED BY: PROCEDURE DATE:  2014-06-27 PROCEDURE: ASA CLASS: INDICATIONS:  coffee-ground emesis MEDICATIONS: fentanyl 25 g, Versed 3 mg TOPICAL ANESTHETIC:  DESCRIPTION OF PROCEDURE: After the risks benefits and alternatives of the procedure were thoroughly explained, informed consent was obtained.  The Pentax      endoscope was introduced through the mouth and advanced to the second portion of the duodenum , Without limitations.  The instrument was slowly withdrawn as the mucosa was fully examined.    esophagus:widely patent lower esophageal ring without other esophageal abnormality stomach: Minimal antral gastritis no focal abnormality no fresh or old blood seen anywhere duodenum: Normal bulb and second portion          The scope was then withdrawn from the patient and the procedure completed.  COMPLICATIONS: There were no immediate complications.  ENDOSCOPIC IMPRESSION: lower esophageal ring Mild antral gastritis No evidence or stigmata of acute or recent bleeding  RECOMMENDATIONS: monitor for further vomiting, monitor stools and hemoglobin. Advance diet as tolerated. Consider trial of proton pump inhibitor for 6 weeks for possible missed lesions  REPEAT EXAM:  eSigned:  Teena Irani, MD June 27, 2014 10:16 AM    CC:  CPT CODES: ICD CODES:  The ICD and CPT codes recommended by this software are interpretations from the data that the clinical staff has captured with the software.  The verification of the translation of this report to the ICD and CPT codes and modifiers is the sole responsibility of the health care institution and practicing physician where this report was generated.  [facility] will not be held  responsible for the validity of the ICD and CPT codes included on this report.  AMA assumes no liability for data contained or not contained herein. CPT is a Designer, television/film set of the [employer].

## 2014-06-17 NOTE — Progress Notes (Signed)
Subjective: Seen enroute to egd-no further bleeding this am--husband bedside  Objective: Vital signs in last 24 hours: Temp:  [98.3 F (36.8 C)-98.7 F (37.1 C)] 98.3 F (36.8 C) (02/02 0951) Pulse Rate:  [86-100] 88 (02/02 1020) Resp:  [13-25] 19 (02/02 1020) BP: (108-166)/(33-70) 115/46 mmHg (02/02 1020) SpO2:  [93 %-98 %] 95 % (02/02 1020) Weight:  [88.905 kg (196 lb)] 88.905 kg (196 lb) (02/01 2101) Weight change:   CBG (last 3)   Recent Labs  06/17/14 0346 06/17/14 0644 06/17/14 0746  GLUCAP 133* 134* 136*    Intake/Output from previous day: 02/01 0701 - 02/02 0700 In: 729.2 [I.V.:729.2] Out: -   Physical Exam: Alert, no distress No jvd Chest clear RRR Abdomen benign Neuro normal No edema   Lab Results:  Recent Labs  06/16/14 2104 06/17/14 0537  NA 136 140  K 4.1 3.7  CL 103 107  CO2 22 23  GLUCOSE 315* 144*  BUN 41* 37*  CREATININE 0.92 0.92  CALCIUM 9.2 9.1    Recent Labs  06/16/14 2104  AST 28  ALT 16  ALKPHOS 123*  BILITOT 0.8  PROT 6.0  ALBUMIN 3.0*    Recent Labs  06/16/14 2104 06/17/14 0040 06/17/14 0537  WBC 8.6  --  7.9  NEUTROABS 4.8  --   --   HGB 10.2* 9.9* 9.5*  HCT 30.3*  --  27.9*  MCV 87.6  --  88.6  PLT 206  --  175   Lab Results  Component Value Date   INR 1.21 06/17/2014   INR 1.31 06/16/2014   INR 0.94 05/10/2010   No results for input(s): CKTOTAL, CKMB, CKMBINDEX, TROPONINI in the last 72 hours. No results for input(s): TSH, T4TOTAL, T3FREE, THYROIDAB in the last 72 hours.  Invalid input(s): FREET3 No results for input(s): VITAMINB12, FOLATE, FERRITIN, TIBC, IRON, RETICCTPCT in the last 72 hours.  Studies/Results: Dg Chest Portable 1 View  06/16/2014   CLINICAL DATA:  Acute onset of hematemesis.  Initial encounter.  EXAM: PORTABLE CHEST - 1 VIEW  COMPARISON:  Chest radiograph performed 05/09/2010  FINDINGS: The lungs are well-aerated and clear. There is no evidence of focal opacification, pleural  effusion or pneumothorax.  The cardiomediastinal silhouette is within normal limits. No acute osseous abnormalities are seen. There is nonspecific lucency within the humeral heads, more prominent on the left, which appears grossly stable from 2008.  IMPRESSION: No acute cardiopulmonary process seen.   Electronically Signed   By: Garald Balding M.D.   On: 06/16/2014 21:31     Assessment/Plan: 1. UGI bleed- per endo--itis, see orders 2. CAD/stent-stable 3. DM2- stable  Advance diet   LOS: 1 day   Tonya Hunter A 06/17/2014, 10:55 AM

## 2014-06-17 NOTE — Consult Note (Signed)
[facility] Consult Note  Referring Provider: No ref. provider found Primary Care Physician:  [name], MD Primary Gastroenterologist:  Dr.  Laurel Dimmer Complaint: Vomiting blood HPI: Tonya Hunter is an 80 y.o. white female  who presents with vomiting black emesis followed by vomiting dark red blood and clots on 2 occasions. She denies any abdominal pain in her stool was heme-negative. She had stable blood pressure and presenting to the emergency room. She is on Plavix because of a drug-eluting stent which has been held. She denies any prior upper GI bleeding. She denies any use of nonsteroidal anti-inflammatory drugs. She is started on a Protonix drip and had a hemoglobin of 10.2  Past Medical History  Diagnosis Date  . Coronary artery disease 09/2005    s/p TAXUS DRUG-ELUTING STENT PLACEMENT TO THE LEFT ANTERIOR DESCENDING ARTERY  . Hypertension   . Diabetes mellitus     TYPE II  . Hyperlipidemia   . Osteoarthrosis, unspecified whether generalized or localized, unspecified site   . Thyroid disease     HYPOTHYROIDISM  . History of hysterectomy     Past Surgical History  Procedure Laterality Date  . Median nerve repair  2009    DECOMPRESSION...RIGHT AND LEFT DECOMPRESSION  . Appendectomy    . Cataract extraction    . Cholecystectomy    . Hernia repair    . Knee surgery    . Coronary stent placement  09/2005    PLACEMENT TO LEFT ANTERIOR DESCENDING ARTERY    Medications Prior to Admission  Medication Sig Dispense Refill  . aspirin 325 MG EC tablet Take 325 mg by mouth daily.    . clopidogrel (PLAVIX) 75 MG tablet TAKE 1 TABLET BY MOUTH DAILY 90 tablet 0  . furosemide (LASIX) 40 MG tablet TAKE 1/2 (HALF) TAB QD    . hydrocortisone valerate cream (WESTCORT) 0.2 % Apply 1 application topically as needed. For skin iritation    . insulin glargine (LANTUS) 100 UNIT/ML injection Inject 50 Units into the skin at bedtime.    . insulin lispro protamine-lispro (HUMALOG  75/25 MIX) (75-25) 100 UNIT/ML SUSP injection Inject 10 Units into the skin 3 (three) times daily.     Marland Kitchen levothyroxine (SYNTHROID, LEVOTHROID) 200 MCG tablet Take 200 mcg by mouth daily.      . metFORMIN (GLUCOPHAGE) 500 MG tablet Take 1,000 mg by mouth 2 (two) times daily with a meal. 2 in the morning 2 in the evening.    . metoprolol (LOPRESSOR) 50 MG tablet Take 1 tablet (50 mg total) by mouth 2 (two) times daily. 180 tablet 2  . potassium chloride (K-DUR,KLOR-CON) 10 MEQ tablet Take 1 tablet by mouth daily.    . simvastatin (ZOCOR) 20 MG tablet Take 1 tablet by mouth daily at 6 PM.     . nitroGLYCERIN (NITROSTAT) 0.4 MG SL tablet TAKE 1 TABLET UNDER TONGUE AT ONSET OF CHEST PAIN; MAY REPEAT IN 5 MIN FOR UP TO 3 DOSES. 25 tablet 2    Allergies:  Allergies  Allergen Reactions  . Penicillins Other (See Comments)    weakness  . Meperidine Hcl Other (See Comments)    unknown    Family History  Problem Relation Age of Onset  . Diabetes    . Hypertension      Social History:  reports that she has never smoked. She does not have any smokeless tobacco history on file. She reports that she does not drink alcohol or use illicit drugs.  Review of  Systems: negative except as above   Blood pressure 112/55, pulse 100, temperature 98.5 F (36.9 C), temperature source Oral, resp. rate 20, height 5' 6" (1.676 m), weight 88.905 kg (196 lb), SpO2 97 %. Head: Normocephalic, without obvious abnormality, atraumatic Neck: no adenopathy, no carotid bruit, no JVD, supple, symmetrical, trachea midline and thyroid not enlarged, symmetric, no tenderness/mass/nodules Resp: clear to auscultation bilaterally Cardio: regular rate and rhythm, S1, S2 normal, no murmur, click, rub or gallop GI: Abdomen soft nondistended with normoactive bowel sounds. No hepatosplenomegaly mass or guarding Extremities: extremities normal, atraumatic, no cyanosis or edema  Results for orders placed or performed during the  hospital encounter of 06/16/14 (from the past 48 hour(s))  CBC with Differential     Status: Abnormal   Collection Time: 06/16/14  9:04 PM  Result Value Ref Range   WBC 8.6 4.0 - 10.5 K/uL   RBC 3.46 (L) 3.87 - 5.11 MIL/uL   Hemoglobin 10.2 (L) 12.0 - 15.0 g/dL   HCT 30.3 (L) 36.0 - 46.0 %   MCV 87.6 78.0 - 100.0 fL   MCH 29.5 26.0 - 34.0 pg   MCHC 33.7 30.0 - 36.0 g/dL   RDW 12.8 11.5 - 15.5 %   Platelets 206 150 - 400 K/uL   Neutrophils Relative % 56 43 - 77 %   Neutro Abs 4.8 1.7 - 7.7 K/uL   Lymphocytes Relative 34 12 - 46 %   Lymphs Abs 2.9 0.7 - 4.0 K/uL   Monocytes Relative 9 3 - 12 %   Monocytes Absolute 0.7 0.1 - 1.0 K/uL   Eosinophils Relative 1 0 - 5 %   Eosinophils Absolute 0.1 0.0 - 0.7 K/uL   Basophils Relative 0 0 - 1 %   Basophils Absolute 0.0 0.0 - 0.1 K/uL  Comprehensive metabolic panel     Status: Abnormal   Collection Time: 06/16/14  9:04 PM  Result Value Ref Range   Sodium 136 135 - 145 mmol/L   Potassium 4.1 3.5 - 5.1 mmol/L   Chloride 103 96 - 112 mmol/L   CO2 22 19 - 32 mmol/L   Glucose, Bld 315 (H) 70 - 99 mg/dL   BUN 41 (H) 6 - 23 mg/dL   Creatinine, Ser 0.92 0.50 - 1.10 mg/dL   Calcium 9.2 8.4 - 10.5 mg/dL   Total Protein 6.0 6.0 - 8.3 g/dL   Albumin 3.0 (L) 3.5 - 5.2 g/dL   AST 28 0 - 37 U/L   ALT 16 0 - 35 U/L   Alkaline Phosphatase 123 (H) 39 - 117 U/L   Total Bilirubin 0.8 0.3 - 1.2 mg/dL   GFR calc non Af Amer 59 (L) >90 mL/min   GFR calc Af Amer 68 (L) >90 mL/min    Comment: (NOTE) The eGFR has been calculated using the CKD EPI equation. This calculation has not been validated in all clinical situations. eGFR's persistently <90 mL/min signify possible Chronic Kidney Disease.    Anion gap 11 5 - 15  Lipase, blood     Status: Abnormal   Collection Time: 06/16/14  9:04 PM  Result Value Ref Range   Lipase 67 (H) 11 - 59 U/L  APTT     Status: None   Collection Time: 06/16/14  9:04 PM  Result Value Ref Range   aPTT 37 24 - 37 seconds     Comment:        IF BASELINE aPTT IS ELEVATED, SUGGEST PATIENT RISK ASSESSMENT BE USED   TO DETERMINE APPROPRIATE ANTICOAGULANT THERAPY.   Protime-INR     Status: Abnormal   Collection Time: 06/16/14  9:04 PM  Result Value Ref Range   Prothrombin Time 16.4 (H) 11.6 - 15.2 seconds   INR 1.31 0.00 - 1.49  I-stat troponin, ED (only if pt is 78 y.o. or older & pain is above umbilicus) - do not order at [facility]     Status: None   Collection Time: 06/16/14  9:37 PM  Result Value Ref Range   Troponin i, poc 0.01 0.00 - 0.08 ng/mL   Comment 3            Comment: Due to the release kinetics of cTnI, a negative result within the first hours of the onset of symptoms does not rule out myocardial infarction with certainty. If myocardial infarction is still suspected, repeat the test at appropriate intervals.   Urinalysis, Routine w reflex microscopic     Status: Abnormal   Collection Time: 06/16/14  9:38 PM  Result Value Ref Range   Color, Urine YELLOW YELLOW   APPearance CLEAR CLEAR   Specific Gravity, Urine 1.029 1.005 - 1.030   pH 5.0 5.0 - 8.0   Glucose, UA >1000 (A) NEGATIVE mg/dL   Hgb urine dipstick NEGATIVE NEGATIVE   Bilirubin Urine NEGATIVE NEGATIVE   Ketones, ur 15 (A) NEGATIVE mg/dL   Protein, ur NEGATIVE NEGATIVE mg/dL   Urobilinogen, UA 1.0 0.0 - 1.0 mg/dL   Nitrite NEGATIVE NEGATIVE   Leukocytes, UA LARGE (A) NEGATIVE  Urine microscopic-add on     Status: Abnormal   Collection Time: 06/16/14  9:38 PM  Result Value Ref Range   Squamous Epithelial / LPF FEW (A) RARE   WBC, UA 11-20 <3 WBC/hpf   RBC / HPF 0-2 <3 RBC/hpf   Bacteria, UA RARE RARE   Casts HYALINE CASTS (A) NEGATIVE   Urine-Other MUCOUS PRESENT   Type and screen     Status: None   Collection Time: 06/16/14 10:54 PM  Result Value Ref Range   ABO/RH(D) O POS    Antibody Screen NEG    Sample Expiration 06/19/2014   ABO/Rh     Status: None (Preliminary result)   Collection Time: 06/16/14 10:54 PM  Result  Value Ref Range   ABO/RH(D) O POS   POC occult blood, ED RN will collect     Status: None   Collection Time: 06/16/14 11:17 PM  Result Value Ref Range   Fecal Occult Bld NEGATIVE NEGATIVE  Glucose, capillary     Status: Abnormal   Collection Time: 06/16/14 11:57 PM  Result Value Ref Range   Glucose-Capillary 218 (H) 70 - 99 mg/dL  Protime-INR     Status: Abnormal   Collection Time: 06/17/14 12:40 AM  Result Value Ref Range   Prothrombin Time 15.4 (H) 11.6 - 15.2 seconds   INR 1.21 0.00 - 1.49  Hemoglobin     Status: Abnormal   Collection Time: 06/17/14 12:40 AM  Result Value Ref Range   Hemoglobin 9.9 (L) 12.0 - 15.0 g/dL  Glucose, capillary     Status: Abnormal   Collection Time: 06/17/14  3:46 AM  Result Value Ref Range   Glucose-Capillary 133 (H) 70 - 99 mg/dL  Basic metabolic panel     Status: Abnormal   Collection Time: 06/17/14  5:37 AM  Result Value Ref Range   Sodium 140 135 - 145 mmol/L   Potassium 3.7 3.5 - 5.1 mmol/L   Chloride 107 96 -  112 mmol/L   CO2 23 19 - 32 mmol/L   Glucose, Bld 144 (H) 70 - 99 mg/dL   BUN 37 (H) 6 - 23 mg/dL   Creatinine, Ser 0.92 0.50 - 1.10 mg/dL   Calcium 9.1 8.4 - 10.5 mg/dL   GFR calc non Af Amer 59 (L) >90 mL/min   GFR calc Af Amer 68 (L) >90 mL/min    Comment: (NOTE) The eGFR has been calculated using the CKD EPI equation. This calculation has not been validated in all clinical situations. eGFR's persistently <90 mL/min signify possible Chronic Kidney Disease.    Anion gap 10 5 - 15  CBC     Status: Abnormal   Collection Time: 06/17/14  5:37 AM  Result Value Ref Range   WBC 7.9 4.0 - 10.5 K/uL   RBC 3.15 (L) 3.87 - 5.11 MIL/uL   Hemoglobin 9.5 (L) 12.0 - 15.0 g/dL   HCT 27.9 (L) 36.0 - 46.0 %   MCV 88.6 78.0 - 100.0 fL   MCH 30.2 26.0 - 34.0 pg   MCHC 34.1 30.0 - 36.0 g/dL   RDW 12.9 11.5 - 15.5 %   Platelets 175 150 - 400 K/uL  Glucose, capillary     Status: Abnormal   Collection Time: 06/17/14  6:44 AM  Result Value  Ref Range   Glucose-Capillary 134 (H) 70 - 99 mg/dL   Dg Chest Portable 1 View  06/16/2014   CLINICAL DATA:  Acute onset of hematemesis.  Initial encounter.  EXAM: PORTABLE CHEST - 1 VIEW  COMPARISON:  Chest radiograph performed 05/09/2010  FINDINGS: The lungs are well-aerated and clear. There is no evidence of focal opacification, pleural effusion or pneumothorax.  The cardiomediastinal silhouette is within normal limits. No acute osseous abnormalities are seen. There is nonspecific lucency within the humeral heads, more prominent on the left, which appears grossly stable from 2008.  IMPRESSION: No acute cardiopulmonary process seen.   Electronically Signed   By: Jeffery  Chang M.D.   On: 06/16/2014 21:31    Assessment: Acute upper GI bleed associated with anticoagulant use, suspect peptic ulcer disease Plan:  Will proceed with EGD this morning, continue Protonix, transfuse as needed. , C 06/17/2014, 7:43 AM    

## 2014-06-17 NOTE — Progress Notes (Signed)
**Note Tonya Hunter** NURSING PROGRESS NOTE  [name] [id number] Admission Data: 06/17/2014 2330 Attending Provider: [name], MD [email or web address],[id number] A, MD Code Status: full  Tonya Hunter is a 80 y.o. female patient admitted from ED:  -No acute distress noted.  -No complaints of shortness of breath.  -No complaints of chest pain.   Blood pressure 156/56, pulse 95, temperature 98.7 F (37.1 C), temperature source Oral, resp. rate 18, height 5' 6"  (1.676 m), weight 88.905 kg (196 lb), SpO2 98 %.   IV Fluids:  IV in place, occlusive dsg intact without redness, IV cath antecubital left, condition patent and no redness normal saline.   Allergies:  Penicillins and Meperidine hcl  Past Medical History:   has a past medical history of Coronary artery disease (09/2005); Hypertension; Diabetes mellitus; Hyperlipidemia; Osteoarthrosis, unspecified whether generalized or localized, unspecified site; Thyroid disease; and History of hysterectomy.  Past Surgical History:   has past surgical history that includes Median nerve repair (2009); Appendectomy; Cataract extraction; Cholecystectomy; Hernia repair; Knee surgery; and Coronary stent placement (09/2005).  Social History:   reports that she has never smoked. She does not have any smokeless tobacco history on file. She reports that she does not drink alcohol or use illicit drugs.  Skin: WDL  Patient/Family oriented to room. Information packet given to patient/family. Admission inpatient armband information verified with patient/family to include name and date of birth and placed on patient arm. Side rails up x 2, fall assessment and education completed with patient/family. Patient/family able to verbalize understanding of risk associated with falls and verbalized understanding to call for assistance before getting out of bed. Call light within reach. Patient/family able to voice and demonstrate understanding of unit orientation instructions.

## 2014-06-17 NOTE — H&P (View-Only) (Signed)
[facility] Consult Note  Referring Provider: No ref. provider found Primary Care Physician:  ARONSON,[name], MD Primary Gastroenterologist:  Dr.  Chief Complaint: Vomiting blood HPI: Tonya Hunter is an 79 y.o. white female  who presents with vomiting black emesis followed by vomiting dark red blood and clots on 2 occasions. She denies any abdominal pain in her stool was heme-negative. She had stable blood pressure and presenting to the emergency room. She is on Plavix because of a drug-eluting stent which has been held. She denies any prior upper GI bleeding. She denies any use of nonsteroidal anti-inflammatory drugs. She is started on a Protonix drip and had a hemoglobin of 10.2  Past Medical History  Diagnosis Date  . Coronary artery disease 09/2005    s/p TAXUS DRUG-ELUTING STENT PLACEMENT TO THE LEFT ANTERIOR DESCENDING ARTERY  . Hypertension   . Diabetes mellitus     TYPE II  . Hyperlipidemia   . Osteoarthrosis, unspecified whether generalized or localized, unspecified site   . Thyroid disease     HYPOTHYROIDISM  . History of hysterectomy     Past Surgical History  Procedure Laterality Date  . Median nerve repair  2009    DECOMPRESSION...RIGHT AND LEFT DECOMPRESSION  . Appendectomy    . Cataract extraction    . Cholecystectomy    . Hernia repair    . Knee surgery    . Coronary stent placement  09/2005    PLACEMENT TO LEFT ANTERIOR DESCENDING ARTERY    Medications Prior to Admission  Medication Sig Dispense Refill  . aspirin 325 MG EC tablet Take 325 mg by mouth daily.    . clopidogrel (PLAVIX) 75 MG tablet TAKE 1 TABLET BY MOUTH DAILY 90 tablet 0  . furosemide (LASIX) 40 MG tablet TAKE 1/2 (HALF) TAB QD    . hydrocortisone valerate cream (WESTCORT) 0.2 % Apply 1 application topically as needed. For skin iritation    . insulin glargine (LANTUS) 100 UNIT/ML injection Inject 50 Units into the skin at bedtime.    . insulin lispro protamine-lispro (HUMALOG  75/25 MIX) (75-25) 100 UNIT/ML SUSP injection Inject 10 Units into the skin 3 (three) times daily.     . levothyroxine (SYNTHROID, LEVOTHROID) 200 MCG tablet Take 200 mcg by mouth daily.      . metFORMIN (GLUCOPHAGE) 500 MG tablet Take 1,000 mg by mouth 2 (two) times daily with a meal. 2 in the morning 2 in the evening.    . metoprolol (LOPRESSOR) 50 MG tablet Take 1 tablet (50 mg total) by mouth 2 (two) times daily. 180 tablet 2  . potassium chloride (K-DUR,KLOR-CON) 10 MEQ tablet Take 1 tablet by mouth daily.    . simvastatin (ZOCOR) 20 MG tablet Take 1 tablet by mouth daily at 6 PM.     . nitroGLYCERIN (NITROSTAT) 0.4 MG SL tablet TAKE 1 TABLET UNDER TONGUE AT ONSET OF CHEST PAIN; MAY REPEAT IN 5 MIN FOR UP TO 3 DOSES. 25 tablet 2    Allergies:  Allergies  Allergen Reactions  . Penicillins Other (See Comments)    weakness  . Meperidine Hcl Other (See Comments)    unknown    Family History  Problem Relation Age of Onset  . Diabetes    . Hypertension      Social History:  reports that she has never smoked. She does not have any smokeless tobacco history on file. She reports that she does not drink alcohol or use illicit drugs.  Review of   Systems: negative except as above   Blood pressure 112/55, pulse 100, temperature 98.5 F (36.9 C), temperature source Oral, resp. rate 20, height 5' 6" (1.676 m), weight 88.905 kg (196 lb), SpO2 97 %. Head: Normocephalic, without obvious abnormality, atraumatic Neck: no adenopathy, no carotid bruit, no JVD, supple, symmetrical, trachea midline and thyroid not enlarged, symmetric, no tenderness/mass/nodules Resp: clear to auscultation bilaterally Cardio: regular rate and rhythm, S1, S2 normal, no murmur, click, rub or gallop GI: Abdomen soft nondistended with normoactive bowel sounds. No hepatosplenomegaly mass or guarding Extremities: extremities normal, atraumatic, no cyanosis or edema  Results for orders placed or performed during the  hospital encounter of 06/16/14 (from the past 48 hour(s))  CBC with Differential     Status: Abnormal   Collection Time: 06/16/14  9:04 PM  Result Value Ref Range   WBC 8.6 4.0 - 10.5 K/uL   RBC 3.46 (L) 3.87 - 5.11 MIL/uL   Hemoglobin 10.2 (L) 12.0 - 15.0 g/dL   HCT 30.3 (L) 36.0 - 46.0 %   MCV 87.6 78.0 - 100.0 fL   MCH 29.5 26.0 - 34.0 pg   MCHC 33.7 30.0 - 36.0 g/dL   RDW 12.8 11.5 - 15.5 %   Platelets 206 150 - 400 K/uL   Neutrophils Relative % 56 43 - 77 %   Neutro Abs 4.8 1.7 - 7.7 K/uL   Lymphocytes Relative 34 12 - 46 %   Lymphs Abs 2.9 0.7 - 4.0 K/uL   Monocytes Relative 9 3 - 12 %   Monocytes Absolute 0.7 0.1 - 1.0 K/uL   Eosinophils Relative 1 0 - 5 %   Eosinophils Absolute 0.1 0.0 - 0.7 K/uL   Basophils Relative 0 0 - 1 %   Basophils Absolute 0.0 0.0 - 0.1 K/uL  Comprehensive metabolic panel     Status: Abnormal   Collection Time: 06/16/14  9:04 PM  Result Value Ref Range   Sodium 136 135 - 145 mmol/L   Potassium 4.1 3.5 - 5.1 mmol/L   Chloride 103 96 - 112 mmol/L   CO2 22 19 - 32 mmol/L   Glucose, Bld 315 (H) 70 - 99 mg/dL   BUN 41 (H) 6 - 23 mg/dL   Creatinine, Ser 0.92 0.50 - 1.10 mg/dL   Calcium 9.2 8.4 - 10.5 mg/dL   Total Protein 6.0 6.0 - 8.3 g/dL   Albumin 3.0 (L) 3.5 - 5.2 g/dL   AST 28 0 - 37 U/L   ALT 16 0 - 35 U/L   Alkaline Phosphatase 123 (H) 39 - 117 U/L   Total Bilirubin 0.8 0.3 - 1.2 mg/dL   GFR calc non Af Amer 59 (L) >90 mL/min   GFR calc Af Amer 68 (L) >90 mL/min    Comment: (NOTE) The eGFR has been calculated using the CKD EPI equation. This calculation has not been validated in all clinical situations. eGFR's persistently <90 mL/min signify possible Chronic Kidney Disease.    Anion gap 11 5 - 15  Lipase, blood     Status: Abnormal   Collection Time: 06/16/14  9:04 PM  Result Value Ref Range   Lipase 67 (H) 11 - 59 U/L  APTT     Status: None   Collection Time: 06/16/14  9:04 PM  Result Value Ref Range   aPTT 37 24 - 37 seconds     Comment:        IF BASELINE aPTT IS ELEVATED, SUGGEST PATIENT RISK ASSESSMENT BE USED   TO DETERMINE APPROPRIATE ANTICOAGULANT THERAPY.   Protime-INR     Status: Abnormal   Collection Time: 06/16/14  9:04 PM  Result Value Ref Range   Prothrombin Time 16.4 (H) 11.6 - 15.2 seconds   INR 1.31 0.00 - 1.49  I-stat troponin, ED (only if pt is 78 y.o. or older & pain is above umbilicus) - do not order at [facility]     Status: None   Collection Time: 06/16/14  9:37 PM  Result Value Ref Range   Troponin i, poc 0.01 0.00 - 0.08 ng/mL   Comment 3            Comment: Due to the release kinetics of cTnI, a negative result within the first hours of the onset of symptoms does not rule out myocardial infarction with certainty. If myocardial infarction is still suspected, repeat the test at appropriate intervals.   Urinalysis, Routine w reflex microscopic     Status: Abnormal   Collection Time: 06/16/14  9:38 PM  Result Value Ref Range   Color, Urine YELLOW YELLOW   APPearance CLEAR CLEAR   Specific Gravity, Urine 1.029 1.005 - 1.030   pH 5.0 5.0 - 8.0   Glucose, UA >1000 (A) NEGATIVE mg/dL   Hgb urine dipstick NEGATIVE NEGATIVE   Bilirubin Urine NEGATIVE NEGATIVE   Ketones, ur 15 (A) NEGATIVE mg/dL   Protein, ur NEGATIVE NEGATIVE mg/dL   Urobilinogen, UA 1.0 0.0 - 1.0 mg/dL   Nitrite NEGATIVE NEGATIVE   Leukocytes, UA LARGE (A) NEGATIVE  Urine microscopic-add on     Status: Abnormal   Collection Time: 06/16/14  9:38 PM  Result Value Ref Range   Squamous Epithelial / LPF FEW (A) RARE   WBC, UA 11-20 <3 WBC/hpf   RBC / HPF 0-2 <3 RBC/hpf   Bacteria, UA RARE RARE   Casts HYALINE CASTS (A) NEGATIVE   Urine-Other MUCOUS PRESENT   Type and screen     Status: None   Collection Time: 06/16/14 10:54 PM  Result Value Ref Range   ABO/RH(D) O POS    Antibody Screen NEG    Sample Expiration 06/19/2014   ABO/Rh     Status: None (Preliminary result)   Collection Time: 06/16/14 10:54 PM  Result  Value Ref Range   ABO/RH(D) O POS   POC occult blood, ED RN will collect     Status: None   Collection Time: 06/16/14 11:17 PM  Result Value Ref Range   Fecal Occult Bld NEGATIVE NEGATIVE  Glucose, capillary     Status: Abnormal   Collection Time: 06/16/14 11:57 PM  Result Value Ref Range   Glucose-Capillary 218 (H) 70 - 99 mg/dL  Protime-INR     Status: Abnormal   Collection Time: 06/17/14 12:40 AM  Result Value Ref Range   Prothrombin Time 15.4 (H) 11.6 - 15.2 seconds   INR 1.21 0.00 - 1.49  Hemoglobin     Status: Abnormal   Collection Time: 06/17/14 12:40 AM  Result Value Ref Range   Hemoglobin 9.9 (L) 12.0 - 15.0 g/dL  Glucose, capillary     Status: Abnormal   Collection Time: 06/17/14  3:46 AM  Result Value Ref Range   Glucose-Capillary 133 (H) 70 - 99 mg/dL  Basic metabolic panel     Status: Abnormal   Collection Time: 06/17/14  5:37 AM  Result Value Ref Range   Sodium 140 135 - 145 mmol/L   Potassium 3.7 3.5 - 5.1 mmol/L   Chloride 107 96 -  112 mmol/L   CO2 23 19 - 32 mmol/L   Glucose, Bld 144 (H) 70 - 99 mg/dL   BUN 37 (H) 6 - 23 mg/dL   Creatinine, Ser 0.92 0.50 - 1.10 mg/dL   Calcium 9.1 8.4 - 10.5 mg/dL   GFR calc non Af Amer 59 (L) >90 mL/min   GFR calc Af Amer 68 (L) >90 mL/min    Comment: (NOTE) The eGFR has been calculated using the CKD EPI equation. This calculation has not been validated in all clinical situations. eGFR's persistently <90 mL/min signify possible Chronic Kidney Disease.    Anion gap 10 5 - 15  CBC     Status: Abnormal   Collection Time: 06/17/14  5:37 AM  Result Value Ref Range   WBC 7.9 4.0 - 10.5 K/uL   RBC 3.15 (L) 3.87 - 5.11 MIL/uL   Hemoglobin 9.5 (L) 12.0 - 15.0 g/dL   HCT 27.9 (L) 36.0 - 46.0 %   MCV 88.6 78.0 - 100.0 fL   MCH 30.2 26.0 - 34.0 pg   MCHC 34.1 30.0 - 36.0 g/dL   RDW 12.9 11.5 - 15.5 %   Platelets 175 150 - 400 K/uL  Glucose, capillary     Status: Abnormal   Collection Time: 06/17/14  6:44 AM  Result Value  Ref Range   Glucose-Capillary 134 (H) 70 - 99 mg/dL   Dg Chest Portable 1 View  06/16/2014   CLINICAL DATA:  Acute onset of hematemesis.  Initial encounter.  EXAM: PORTABLE CHEST - 1 VIEW  COMPARISON:  Chest radiograph performed 05/09/2010  FINDINGS: The lungs are well-aerated and clear. There is no evidence of focal opacification, pleural effusion or pneumothorax.  The cardiomediastinal silhouette is within normal limits. No acute osseous abnormalities are seen. There is nonspecific lucency within the humeral heads, more prominent on the left, which appears grossly stable from 2008.  IMPRESSION: No acute cardiopulmonary process seen.   Electronically Signed   By: Jeffery  Chang M.D.   On: 06/16/2014 21:31    Assessment: Acute upper GI bleed associated with anticoagulant use, suspect peptic ulcer disease Plan:  Will proceed with EGD this morning, continue Protonix, transfuse as needed. Wiktoria Hemrick C 06/17/2014, 7:43 AM    

## 2014-06-18 ENCOUNTER — Encounter (HOSPITAL_COMMUNITY): Payer: Self-pay | Admitting: Gastroenterology

## 2014-06-18 LAB — CBC
HCT: 26.6 % — ABNORMAL LOW (ref 36.0–46.0)
Hemoglobin: 9 g/dL — ABNORMAL LOW (ref 12.0–15.0)
MCH: 30.4 pg (ref 26.0–34.0)
MCHC: 33.8 g/dL (ref 30.0–36.0)
MCV: 89.9 fL (ref 78.0–100.0)
Platelets: 162 10*3/uL (ref 150–400)
RBC: 2.96 MIL/uL — ABNORMAL LOW (ref 3.87–5.11)
RDW: 13.2 % (ref 11.5–15.5)
WBC: 5.3 10*3/uL (ref 4.0–10.5)

## 2014-06-18 LAB — BASIC METABOLIC PANEL
ANION GAP: 6 (ref 5–15)
BUN: 16 mg/dL (ref 6–23)
CO2: 27 mmol/L (ref 19–32)
Calcium: 8.5 mg/dL (ref 8.4–10.5)
Chloride: 106 mmol/L (ref 96–112)
Creatinine, Ser: 0.88 mg/dL (ref 0.50–1.10)
GFR calc Af Amer: 72 mL/min — ABNORMAL LOW (ref >90)
GFR calc non Af Amer: 62 mL/min — ABNORMAL LOW (ref >90)
Glucose, Bld: 111 mg/dL — ABNORMAL HIGH (ref 70–99)
POTASSIUM: 3.7 mmol/L (ref 3.5–5.1)
Sodium: 139 mmol/L (ref 135–145)

## 2014-06-18 LAB — GLUCOSE, CAPILLARY
GLUCOSE-CAPILLARY: 106 mg/dL — AB (ref 70–99)
GLUCOSE-CAPILLARY: 191 mg/dL — AB (ref 70–99)
Glucose-Capillary: 74 mg/dL (ref 70–99)

## 2014-06-18 LAB — URINE CULTURE: Colony Count: 70000

## 2014-06-18 MED ORDER — PANTOPRAZOLE SODIUM 40 MG PO TBEC
40.0000 mg | DELAYED_RELEASE_TABLET | Freq: Two times a day (BID) | ORAL | Status: DC
  Administered 2014-06-18: 40 mg via ORAL
  Filled 2014-06-18: qty 1

## 2014-06-18 MED ORDER — PANTOPRAZOLE SODIUM 40 MG PO TBEC: 40.0000 mg | DELAYED_RELEASE_TABLET | Freq: Two times a day (BID) | ORAL | Status: DC

## 2014-06-18 MED ORDER — POLYETHYLENE GLYCOL 3350 17 G PO PACK
17.0000 g | PACK | Freq: Three times a day (TID) | ORAL | Status: DC | PRN
  Administered 2014-06-18: 17 g via ORAL
  Filled 2014-06-18 (×3): qty 1

## 2014-06-18 NOTE — Progress Notes (Addendum)
[facility] Progress Note  Subjective: The patient denies any nausea vomiting and has not had any bowel movements. Tolerated clear liquid diet  Objective: Vital signs in last 24 hours: Temp:  [98.1 F (36.7 C)-98.4 F (36.9 C)] 98.4 F (36.9 C) (02/03 0552) Pulse Rate:  [65-93] 65 (02/03 0552) Resp:  [12-25] 12 (02/03 0552) BP: (108-172)/(33-73) 112/47 mmHg (02/03 0552) SpO2:  [92 %-99 %] 92 % (02/03 0552) Weight change:    PE: Abdomen soft  Lab Results: Results for orders placed or performed during the hospital encounter of 06/16/14 (from the past 24 hour(s))  Glucose, capillary     Status: Abnormal   Collection Time: 06/17/14 12:09 PM  Result Value Ref Range   Glucose-Capillary 154 (H) 70 - 99 mg/dL  Glucose, capillary     Status: Abnormal   Collection Time: 06/17/14  3:52 PM  Result Value Ref Range   Glucose-Capillary 225 (H) 70 - 99 mg/dL  Glucose, capillary     Status: Abnormal   Collection Time: 06/17/14  8:11 PM  Result Value Ref Range   Glucose-Capillary 242 (H) 70 - 99 mg/dL   Comment 1 Notify RN   Glucose, capillary     Status: Abnormal   Collection Time: 06/17/14  8:20 PM  Result Value Ref Range   Glucose-Capillary 229 (H) 70 - 99 mg/dL  Glucose, capillary     Status: Abnormal   Collection Time: 06/17/14 11:45 PM  Result Value Ref Range   Glucose-Capillary 143 (H) 70 - 99 mg/dL   Comment 1 Notify RN    Comment 2 Documented in Chart   Glucose, capillary     Status: None   Collection Time: 06/18/14  4:13 AM  Result Value Ref Range   Glucose-Capillary 74 70 - 99 mg/dL  Glucose, capillary     Status: Abnormal   Collection Time: 06/18/14  7:54 AM  Result Value Ref Range   Glucose-Capillary 106 (H) 70 - 99 mg/dL  CBC     Status: Abnormal   Collection Time: 06/18/14  8:14 AM  Result Value Ref Range   WBC 5.3 4.0 - 10.5 K/uL   RBC 2.96 (L) 3.87 - 5.11 MIL/uL   Hemoglobin 9.0 (L) 12.0 - 15.0 g/dL   HCT 26.6 (L) 36.0 - 46.0 %   MCV 89.9 78.0 -  100.0 fL   MCH 30.4 26.0 - 34.0 pg   MCHC 33.8 30.0 - 36.0 g/dL   RDW 13.2 11.5 - 15.5 %   Platelets 162 150 - 400 K/uL    Studies/Results: Dg Chest Portable 1 View  06/16/2014   CLINICAL DATA:  Acute onset of hematemesis.  Initial encounter.  EXAM: PORTABLE CHEST - 1 VIEW  COMPARISON:  Chest radiograph performed 05/09/2010  FINDINGS: The lungs are well-aerated and clear. There is no evidence of focal opacification, pleural effusion or pneumothorax.  The cardiomediastinal silhouette is within normal limits. No acute osseous abnormalities are seen. There is nonspecific lucency within the humeral heads, more prominent on the left, which appears grossly stable from 2008.  IMPRESSION: No acute cardiopulmonary process seen.   Electronically Signed   By: Garald Balding M.D.   On: 06/16/2014 21:31      Assessment: Upper GI bleed with surprisingly clean EGD with no old blood or definite bleeding source  Plan: Would treat with PPI for 6 weeks to cover any missed acid peptic disease. Monitor stools and hemoglobin Will advance diet  Plavix on hold for now  FFKVQ,OHCO C 06/18/2014, 9:31 AM

## 2014-06-18 NOTE — Progress Notes (Signed)
Nsg Discharge Note  Admit Date:  06/16/2014 Discharge date: 06/18/2014   CADIE SORCI to be D/C'd Home per MD order.  AVS completed.  Copy for chart, and copy for patient signed, and dated. Patient/caregiver able to verbalize understanding.  Discharge Medication:   Medication List    STOP taking these medications        aspirin 325 MG EC tablet     clopidogrel 75 MG tablet  Commonly known as:  PLAVIX      TAKE these medications        furosemide 40 MG tablet  Commonly known as:  LASIX  TAKE 1/2 (HALF) TAB QD     hydrocortisone valerate cream 0.2 %  Commonly known as:  WESTCORT  Apply 1 application topically as needed. For skin iritation     insulin glargine 100 UNIT/ML injection  Commonly known as:  LANTUS  Inject 50 Units into the skin at bedtime.     insulin lispro protamine-lispro (75-25) 100 UNIT/ML Susp injection  Commonly known as:  HUMALOG 75/25 MIX  Inject 10 Units into the skin 3 (three) times daily.     levothyroxine 200 MCG tablet  Commonly known as:  SYNTHROID, LEVOTHROID  Take 200 mcg by mouth daily.     metFORMIN 500 MG tablet  Commonly known as:  GLUCOPHAGE  Take 1,000 mg by mouth 2 (two) times daily with a meal. 2 in the morning 2 in the evening.     metoprolol 50 MG tablet  Commonly known as:  LOPRESSOR  Take 1 tablet (50 mg total) by mouth 2 (two) times daily.     nitroGLYCERIN 0.4 MG SL tablet  Commonly known as:  NITROSTAT  TAKE 1 TABLET UNDER TONGUE AT ONSET OF CHEST PAIN; MAY REPEAT IN 5 MIN FOR UP TO 3 DOSES.     pantoprazole 40 MG tablet  Commonly known as:  PROTONIX  Take 1 tablet (40 mg total) by mouth 2 (two) times daily.     potassium chloride 10 MEQ tablet  Commonly known as:  K-DUR,KLOR-CON  Take 1 tablet by mouth daily.     simvastatin 20 MG tablet  Commonly known as:  ZOCOR  Take 1 tablet by mouth daily at 6 PM.        Discharge Assessment: Filed Vitals:   06/18/14 1434  BP: 153/66  Pulse: 73  Temp: 98 F (36.7  C)  Resp: 18   Skin clean, dry and intact without evidence of skin break down, no evidence of skin tears noted. IV catheter discontinued intact. Site without signs and symptoms of complications - no redness or edema noted at insertion site, patient denies c/o pain - only slight tenderness at site.  Dressing with slight pressure applied.  D/c Instructions-Education: Discharge instructions given to patient/family with verbalized understanding. D/c education completed with patient/family including follow up instructions, medication list, d/c activities limitations if indicated, with other d/c instructions as indicated by MD - patient able to verbalize understanding, all questions fully answered. Patient instructed to return to ED, call 911, or call MD for any changes in condition.  Patient escorted via [place], and D/C home via private auto.  Dayle [place], RN 06/18/2014 4:07 PM

## 2014-06-18 NOTE — Discharge Summary (Signed)
DISCHARGE SUMMARY  Tonya Hunter  MR#: [id number]  DOB:02-Jul-1934  Date of Admission: 06/16/2014 Date of Discharge: 06/18/2014  Attending Physician:Tonya Hunter  Patient's NTI:Tonya A, MD  Consults:Treatment Team:  Tonya Sabins, MD  Discharge Diagnoses: Principal Problem:   Upper gastrointestinal bleed Active Problems:   IDDM (insulin dependent diabetes mellitus)   Essential hypertension   Coronary atherosclerosis   Upper GI bleed   Discharge Medications:   Medication List    STOP taking these medications        aspirin 325 MG EC tablet     clopidogrel 75 MG tablet  Commonly known as:  PLAVIX      TAKE these medications        furosemide 40 MG tablet  Commonly known as:  LASIX  TAKE 1/2 (HALF) TAB QD     hydrocortisone valerate cream 0.2 %  Commonly known as:  WESTCORT  Apply 1 application topically as needed. For skin iritation     insulin glargine 100 UNIT/ML injection  Commonly known as:  LANTUS  Inject 50 Units into the skin at bedtime.     insulin lispro protamine-lispro (75-25) 100 UNIT/ML Susp injection  Commonly known as:  HUMALOG 75/25 MIX  Inject 10 Units into the skin 3 (three) times daily.     levothyroxine 200 MCG tablet  Commonly known as:  SYNTHROID, LEVOTHROID  Take 200 mcg by mouth daily.     metFORMIN 500 MG tablet  Commonly known as:  GLUCOPHAGE  Take 1,000 mg by mouth 2 (two) times daily with Hunter meal. 2 in the morning 2 in the evening.     metoprolol 50 MG tablet  Commonly known as:  LOPRESSOR  Take 1 tablet (50 mg total) by mouth 2 (two) times daily.     nitroGLYCERIN 0.4 MG SL tablet  Commonly known as:  NITROSTAT  TAKE 1 TABLET UNDER TONGUE AT ONSET OF CHEST PAIN; MAY REPEAT IN 5 MIN FOR UP TO 3 DOSES.     potassium chloride 10 MEQ tablet  Commonly known as:  K-DUR,KLOR-CON  Take 1 tablet by mouth daily.     simvastatin 20 MG tablet  Commonly known as:  ZOCOR  Take 1 tablet by mouth daily at 6 PM.         Hospital Procedures: Dg Chest Portable 1 View  06/16/2014   CLINICAL DATA:  Acute onset of hematemesis.  Initial encounter.  EXAM: PORTABLE CHEST - 1 VIEW  COMPARISON:  Chest radiograph performed 05/09/2010  FINDINGS: The lungs are well-aerated and clear. There is no evidence of focal opacification, pleural effusion or pneumothorax.  The cardiomediastinal silhouette is within normal limits. No acute osseous abnormalities are seen. There is nonspecific lucency within the humeral heads, more prominent on the left, which appears grossly stable from 2008.  IMPRESSION: No acute cardiopulmonary process seen.   Electronically Signed   By: Tonya Hunter M.D.   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 The patient states that she had an episode of black emesis at 1 PM. Several hours later, she had Hunter recurrent episode of vomiting with dark black blood clots. She states that she was feeling well prior to  the onset of vomiting other than mild anorexia. No other preceding symptoms. Mild lower abdominal discomfort but no cramping, diarrhea, or blood in her stools. Last bowel movement was 2 days ago. Other than her aspirin and Plavix she is not on any anticoagulants, NSAIDs. She denies any alcohol intake. Endorses significant family stress. Due to her vomiting of blood she called EMS. En route she had an additional episode of black emesis. In the emergency department her hemoglobin is 10.2, BUN 41. She is Hemoccult negative. She is hemodynamically stable with Hunter blood pressure of 129/56 with heart rate of 86 on presentation. She was started on continuous Protonix drip in the emergency department and we were  consulted for admission. GI was also called while she was in the emergency department due to concern of active UGI bleed.  Hospital Course: Patient was hydrated, bowel rested and had no bleeding since admitted.  HGB stable 9-9.5.  EGD with itis but no active blood.  Diet advanced and stable upon d/c.  We will call protonix in to [place] and f/u outpt.  Day of Discharge Exam BP 120/58 mmHg  Pulse 70  Temp(Src) 98.4 F (36.9 C) (Oral)  Resp 12  Ht 5' 6"  (1.676 m)  Wt 88.905 kg (196 lb)  BMI 31.65 kg/m2  SpO2 92%  Physical Exam: General appearance: alert, cooperative and no distress Eyes: no scleral icterus Throat: oropharynx moist without erythema Resp: clear to auscultation bilaterally Cardio: regular rate and rhythm, S1, S2 normal, no murmur, click, rub or gallop Extremities: no clubbing, cyanosis or edema Abdomen benign Neuro normal  Discharge Labs:  Recent Labs  06/17/14 0537 06/18/14 0814  NA 140 139  K 3.7 3.7  CL 107 106  CO2 23 27  GLUCOSE 144* 111*  BUN 37* 16  CREATININE 0.92 0.88  CALCIUM 9.1 8.5    Recent Labs  06/16/14 2104  AST 28  ALT 16  ALKPHOS 123*  BILITOT 0.8  PROT 6.0  ALBUMIN 3.0*    Recent Labs  06/16/14 2104  06/17/14 0537 06/18/14 0814  WBC 8.6  --  7.9 5.3  NEUTROABS 4.8  --   --   --   HGB 10.2*  < > 9.5* 9.0*  HCT 30.3*  --  27.9* 26.6*  MCV 87.6  --  88.6 89.9  PLT 206  --  175 162  < > = values in this interval not displayed. No results for input(s): CKTOTAL, CKMB, CKMBINDEX, TROPONINI in the last 72 hours. No results for input(s): TSH, T4TOTAL, T3FREE, THYROIDAB in the last 72 hours.  Invalid input(s): FREET3 No results for input(s): VITAMINB12, FOLATE, FERRITIN, TIBC, IRON, RETICCTPCT in the last 72 hours.  Discharge instructions:     Discharge Instructions    Diet - low sodium heart healthy    Complete by:  As directed      Increase activity slowly    Complete by:  As directed             Disposition:home  Follow-up Appts: Follow-up with Tonya Hunter at [facility] in 1 week.  Call for appointment.  Condition on Discharge: improved   Tests Needing Follow-up: Cbc next week  Signed: Tyron Manetta Hunter 06/18/2014, 1:56 PM

## 2014-06-20 ENCOUNTER — Emergency Department (HOSPITAL_COMMUNITY): Payer: Medicare Other

## 2014-06-20 ENCOUNTER — Telehealth: Payer: Self-pay | Admitting: Cardiovascular Disease

## 2014-06-20 ENCOUNTER — Encounter (HOSPITAL_COMMUNITY): Payer: Self-pay | Admitting: Emergency Medicine

## 2014-06-20 ENCOUNTER — Emergency Department (HOSPITAL_COMMUNITY)
Admission: EM | Admit: 2014-06-20 | Discharge: 2014-06-20 | Disposition: A | Discharge status: Home / Self Care | Payer: Medicare Other | Attending: Emergency Medicine | Admitting: Emergency Medicine

## 2014-06-20 DIAGNOSIS — R079 Chest pain, unspecified: Secondary | ICD-10-CM | POA: Diagnosis not present

## 2014-06-20 DIAGNOSIS — Z7902 Long term (current) use of antithrombotics/antiplatelets: Secondary | ICD-10-CM | POA: Insufficient documentation

## 2014-06-20 DIAGNOSIS — Z7982 Long term (current) use of aspirin: Secondary | ICD-10-CM | POA: Diagnosis not present

## 2014-06-20 DIAGNOSIS — Z880 Allergy status to penicillin: Secondary | ICD-10-CM | POA: Diagnosis not present

## 2014-06-20 DIAGNOSIS — R0789 Other chest pain: Secondary | ICD-10-CM | POA: Diagnosis not present

## 2014-06-20 DIAGNOSIS — D62 Acute posthemorrhagic anemia: Secondary | ICD-10-CM

## 2014-06-20 DIAGNOSIS — Z794 Long term (current) use of insulin: Secondary | ICD-10-CM | POA: Insufficient documentation

## 2014-06-20 DIAGNOSIS — E079 Disorder of thyroid, unspecified: Secondary | ICD-10-CM | POA: Diagnosis not present

## 2014-06-20 DIAGNOSIS — E119 Type 2 diabetes mellitus without complications: Secondary | ICD-10-CM | POA: Insufficient documentation

## 2014-06-20 DIAGNOSIS — Z79899 Other long term (current) drug therapy: Secondary | ICD-10-CM | POA: Diagnosis not present

## 2014-06-20 DIAGNOSIS — E785 Hyperlipidemia, unspecified: Secondary | ICD-10-CM | POA: Insufficient documentation

## 2014-06-20 DIAGNOSIS — R202 Paresthesia of skin: Secondary | ICD-10-CM | POA: Diagnosis not present

## 2014-06-20 DIAGNOSIS — I10 Essential (primary) hypertension: Secondary | ICD-10-CM | POA: Diagnosis not present

## 2014-06-20 DIAGNOSIS — I2510 Atherosclerotic heart disease of native coronary artery without angina pectoris: Secondary | ICD-10-CM | POA: Insufficient documentation

## 2014-06-20 DIAGNOSIS — Z9889 Other specified postprocedural states: Secondary | ICD-10-CM | POA: Diagnosis not present

## 2014-06-20 DIAGNOSIS — M1990 Unspecified osteoarthritis, unspecified site: Secondary | ICD-10-CM | POA: Diagnosis not present

## 2014-06-20 DIAGNOSIS — Z90710 Acquired absence of both cervix and uterus: Secondary | ICD-10-CM | POA: Insufficient documentation

## 2014-06-20 DIAGNOSIS — I25110 Atherosclerotic heart disease of native coronary artery with unstable angina pectoris: Secondary | ICD-10-CM | POA: Diagnosis not present

## 2014-06-20 DIAGNOSIS — R42 Dizziness and giddiness: Secondary | ICD-10-CM

## 2014-06-20 LAB — COMPREHENSIVE METABOLIC PANEL
ALT: 17 U/L (ref 0–35)
AST: 29 U/L (ref 0–37)
Albumin: 3.3 g/dL — ABNORMAL LOW (ref 3.5–5.2)
Alkaline Phosphatase: 117 U/L (ref 39–117)
Anion gap: 5 (ref 5–15)
BUN: 7 mg/dL (ref 6–23)
CO2: 26 mmol/L (ref 19–32)
Calcium: 8.6 mg/dL (ref 8.4–10.5)
Chloride: 108 mmol/L (ref 96–112)
Creatinine, Ser: 0.82 mg/dL (ref 0.50–1.10)
GFR, EST AFRICAN AMERICAN: 78 mL/min — AB (ref >90)
GFR, EST NON AFRICAN AMERICAN: 67 mL/min — AB (ref >90)
Glucose, Bld: 116 mg/dL — ABNORMAL HIGH (ref 70–99)
Potassium: 3.7 mmol/L (ref 3.5–5.1)
Sodium: 139 mmol/L (ref 135–145)
Total Bilirubin: 0.5 mg/dL (ref 0.3–1.2)
Total Protein: 5.9 g/dL — ABNORMAL LOW (ref 6.0–8.3)

## 2014-06-20 LAB — CBC WITH DIFFERENTIAL/PLATELET
BASOS ABS: 0 10*3/uL (ref 0.0–0.1)
Basophils Relative: 0 % (ref 0–1)
EOS ABS: 0 10*3/uL (ref 0.0–0.7)
EOS PCT: 1 % (ref 0–5)
HEMATOCRIT: 24.8 % — AB (ref 36.0–46.0)
HEMOGLOBIN: 8.6 g/dL — AB (ref 12.0–15.0)
Lymphocytes Relative: 21 % (ref 12–46)
Lymphs Abs: 1.4 10*3/uL (ref 0.7–4.0)
MCH: 30.7 pg (ref 26.0–34.0)
MCHC: 34.7 g/dL (ref 30.0–36.0)
MCV: 88.6 fL (ref 78.0–100.0)
Monocytes Absolute: 0.5 10*3/uL (ref 0.1–1.0)
Monocytes Relative: 7 % (ref 3–12)
NEUTROS ABS: 4.6 10*3/uL (ref 1.7–7.7)
Neutrophils Relative %: 71 % (ref 43–77)
Platelets: 162 10*3/uL (ref 150–400)
RBC: 2.8 MIL/uL — AB (ref 3.87–5.11)
RDW: 13.1 % (ref 11.5–15.5)
WBC: 6.4 10*3/uL (ref 4.0–10.5)

## 2014-06-20 LAB — TROPONIN I: Troponin I: <0.03 ng/mL (ref <0.031)

## 2014-06-20 MED ORDER — FERROUS SULFATE 325 (65 FE) MG PO TABS: 325.0000 mg | ORAL_TABLET | Freq: Every day | ORAL | Status: DC

## 2014-06-20 MED ORDER — CLOPIDOGREL BISULFATE 75 MG PO TABS: 75.0000 mg | ORAL_TABLET | Freq: Every day | ORAL | Status: DC

## 2014-06-20 MED ORDER — SODIUM CHLORIDE 0.9 % IV BOLUS (SEPSIS)
500.0000 mL | Freq: Once | INTRAVENOUS | Status: AC
  Administered 2014-06-20: 500 mL via INTRAVENOUS

## 2014-06-20 MED ORDER — NITROGLYCERIN 0.4 MG SL SUBL: SUBLINGUAL_TABLET | SUBLINGUAL | Status: DC

## 2014-06-20 NOTE — ED Notes (Signed)
Ambulated with assistance to bathroom, not dizzy, ambulated without difficulty.

## 2014-06-20 NOTE — ED Notes (Signed)
Pt arrived by [facility] from home with c/o cp that feels like pressure on the center with no radiation. Pt also c/o numbness to hands and knees and lightheadedness. EKG shows RBBB. Nitro administered x 5, pt administered 3 nitro at home with no relief and ems administered nitro x2 we relief after 2nd nitro. Denies pain at this time. Pt has hx of MI in the past with stent placement.

## 2014-06-20 NOTE — ED Provider Notes (Signed)
CSN: [id number]     Arrival date & time 06/20/14  1007 History   First MD Initiated Contact with Patient 06/20/14 1008     Chief Complaint  Patient presents with  . Chest Pain     (Consider location/radiation/quality/duration/timing/severity/associated sxs/prior Treatment) HPI    PCP: [name], MD / Cardiologist - Dr. Johnsie Cancel Blood pressure 104/46, pulse 69, resp. rate 18, SpO2 97 %.  Tonya Hunter is a 80 y.o.female with a significant PMH of CAD (MI and placement of stent to Left Anterior Descending Artery), hypertension, diabetes, hyperlipidemia, arthritis, and thyroid disease presents to the ER with complaints of numbness to 4 and 5th digits to right and left hands, bilateral knee numbness and chest pressure that the patient became aware of when she woke up this morning.  She was released from the hospital on 06/18/2014 for further evaluation of coffee ground emesis, she had an endoscopy done which did not show any abnormalities to explain the bleeding. She reports not feeling 100% when leaving the hospital but because her hemoglobin has improved from 6 to 9 (per pt report) inpatient that she was discharged.   Her pressure was substernal and not associated with nausea, vomiting, rectal bleeding, hematemesis. No weakness, confusion, change in vision, headache, diarrhea. She reports being given nitro x 5 by EMS and did not have pain relief until the 5th nitro. She now has no pressure or tingling sensations. Pt A &O x 3 and currently asymptomatic.   Past Medical History  Diagnosis Date  . Coronary artery disease 09/2005    s/p TAXUS DRUG-ELUTING STENT PLACEMENT TO THE LEFT ANTERIOR DESCENDING ARTERY  . Hypertension   . Diabetes mellitus     TYPE II  . Hyperlipidemia   . Osteoarthrosis, unspecified whether generalized or localized, unspecified site   . Thyroid disease     HYPOTHYROIDISM  . History of hysterectomy    Past Surgical History  Procedure Laterality Date  . Median  nerve repair  2009    DECOMPRESSION...RIGHT AND LEFT DECOMPRESSION  . Appendectomy    . Cataract extraction    . Cholecystectomy    . Hernia repair    . Knee surgery    . Coronary stent placement  09/2005    PLACEMENT TO LEFT ANTERIOR DESCENDING ARTERY  . Esophagogastroduodenoscopy N/A 06/17/2014    Procedure: ESOPHAGOGASTRODUODENOSCOPY (EGD);  Surgeon: Missy Sabins, MD;  Location[facility]COPY;  Service: Endoscopy;  Laterality: N/A;   Family History  Problem Relation Age of Onset  . Diabetes    . Hypertension     History  Substance Use Topics  . Smoking status: Never Smoker   . Smokeless tobacco: Not on file  . Alcohol Use: No   OB History    No data available     Review of Systems  10 Systems reviewed and are negative for acute change except as noted in the HPI.    Allergies  Penicillins; Demerol; and Meperidine hcl  Home Medications   Prior to Admission medications   Medication Sig Start Date End Date Taking? Authorizing Provider  aspirin 325 MG tablet Take 325 mg by mouth once.   Yes Historical Provider, MD  clopidogrel (PLAVIX) 75 MG tablet Take 75 mg by mouth daily.   Yes Historical Provider, MD  furosemide (LASIX) 40 MG tablet Take 20 mg by mouth 2 (two) times daily.    Yes Historical Provider, MD  hydrocortisone valerate cream (WESTCORT) 0.2 % Apply 1 application topically as needed. For  skin iritation 05/05/14  Yes Historical Provider, MD  insulin glargine (LANTUS) 100 UNIT/ML injection Inject 50 Units into the skin at bedtime.   Yes Historical Provider, MD  insulin lispro protamine-lispro (HUMALOG 75/25 MIX) (75-25) 100 UNIT/ML SUSP injection Inject 10 Units into the skin 3 (three) times daily.    Yes Historical Provider, MD  levothyroxine (SYNTHROID, LEVOTHROID) 200 MCG tablet Take 200 mcg by mouth daily.     Yes Historical Provider, MD  metFORMIN (GLUCOPHAGE) 500 MG tablet Take 1,000 mg by mouth 2 (two) times daily with a meal. 2 in the morning 2 in the evening.  06/04/12  Yes Historical Provider, MD  metoprolol (LOPRESSOR) 50 MG tablet Take 1 tablet (50 mg total) by mouth 2 (two) times daily. 07/28/11  Yes Hillary Bow, MD  nitroGLYCERIN (NITROSTAT) 0.4 MG SL tablet TAKE 1 TABLET UNDER TONGUE AT ONSET OF CHEST PAIN; MAY REPEAT IN 5 MIN FOR UP TO 3 DOSES. 05/23/11  Yes Blane Ohara, MD  pantoprazole (PROTONIX) 40 MG tablet Take 1 tablet (40 mg total) by mouth 2 (two) times daily. 06/18/14  Yes [name], MD  potassium chloride (K-DUR,KLOR-CON) 10 MEQ tablet Take 1 tablet by mouth daily. 12/16/11  Yes Historical Provider, MD  simvastatin (ZOCOR) 20 MG tablet Take 1 tablet by mouth daily at 6 PM.  12/24/11  Yes Historical Provider, MD   BP 136/53 mmHg  Pulse 67  Resp 17  SpO2 97% Physical Exam  Constitutional: She is oriented to person, place, and time. She appears well-developed and well-nourished. No distress.  HENT:  Head: Normocephalic and atraumatic.  Eyes: Pupils are equal, round, and reactive to light.  Neck: Normal range of motion. Neck supple.  Cardiovascular: Normal rate and regular rhythm.   Pulmonary/Chest: Effort normal and breath sounds normal. She exhibits no tenderness and no bony tenderness.  Abdominal: Soft. Bowel sounds are normal. There is no tenderness. There is no CVA tenderness.  Neurological: She is alert and oriented to person, place, and time.  Cranial nerves II-VIII and X-XII evaluated and show no deficits. Pt alert and oriented x 3 Upper and lower extremity strength is symmetrical and physiologic Normal muscular tone No facial droop Coordination intact  Skin: Skin is warm and dry.  Psychiatric: Her behavior is normal. Thought content normal.  Nursing note and vitals reviewed.   ED Course  Procedures (including critical care time) Labs Review Labs Reviewed  CBC WITH DIFFERENTIAL/PLATELET - Abnormal; Notable for the following:    RBC 2.80 (*)    Hemoglobin 8.6 (*)    HCT 24.8 (*)    All other components  within normal limits  COMPREHENSIVE METABOLIC PANEL - Abnormal; Notable for the following:    Glucose, Bld 116 (*)    Total Protein 5.9 (*)    Albumin 3.3 (*)    GFR calc non Af Amer 67 (*)    GFR calc Af Amer 78 (*)    All other components within normal limits  TROPONIN I    Imaging Review Dg Chest 2 View  06/20/2014   CLINICAL DATA:  Hematemesis for 4 days.  Chest pain.  EXAM: CHEST - 2 VIEW  COMPARISON:  One-view chest 06/16/2014  FINDINGS: The heart size and mediastinal contours are within normal limits. Both lungs are clear. The visualized skeletal structures are unremarkable.  IMPRESSION: Negative two view chest x-ray   Electronically Signed   By: Lawrence Santiago M.D.   On: 06/20/2014 12:43  EKG Interpretation   Date/Time:  Friday June 20 2014 10:23:49 EST Ventricular Rate:  65 PR Interval:  148 QRS Duration: 153 QT Interval:  494 QTC Calculation: 514 R Axis:   55 Text Interpretation:  Sinus rhythm Right bundle branch block No  significant change since last tracing Confirmed by BEATON  MD, ROBERT  ([id number]) on 06/20/2014 10:40:44 AM      MDM   Final diagnoses:  Chest pain    Patient work-up in the ED showed no acute changes and pt has had no symptoms in the ED. The PA with cardiologist recommends discharging the patient. She has  Given her rx to treat her anemia, please refer to Nell Range, PA-C note from this visit for recommendations. She has a f/u appointment scheduled for recheck. Dr. Audie Pinto aware of plans.  80 y.o.Merida Alcantar Sovine's evaluation in the Emergency Department is complete. It has been determined that no acute conditions requiring further emergency intervention are present at this time. The patient/guardian have been advised of the diagnosis and plan. We have discussed signs and symptoms that warrant return to the ED, such as changes or worsening in symptoms.  Vital signs are stable at discharge. Filed Vitals:   06/20/14 1243  BP: 136/53  Pulse:  67  Resp: 17    Patient/guardian has voiced understanding and agreed to follow-up with the PCP or specialist.     Linus Mako, PA-C 06/20/14 [place], MD 06/20/14 [phone]

## 2014-06-20 NOTE — H&P (Signed)
Patient ID: AVIN UPPERMAN MRN: [id number], DOB/AGE: 1934/07/14   Admit date: 06/20/2014   Primary Physician: [name], MD Primary Cardiologist: Dr. Johnsie Cancel   Pt. Profile:  Tonya Hunter is a 80 y.o. female with a history of CAD s/p DES to LAD (2007) s/p DES for ISR of LAD (2011), HTN, DM, and HLD who presented to [facility] via EMS today for chest pain as well as numbness to 4 and 5th digits to right and left hands, bilateral knee numbness.  She had a first generation DES placed to her LAD in 2007. In 2011, she presented with acute stent thrombosis following a orthopedic procedure and stopping her DAPT. She underwent urgent catheterization, and had a clotted first generation drug-eluting stent. She underwent repeat dilatation with placement of a second stent, has done well since that time.  She was recently admitted to [facility] for evaluation of coffee ground emesis, she had an endoscopy done which did not show any abnormalities to explain the bleeding. Her Hg was noted to be around 6, which climbed to ~9 and she was discharged on 06/18/2014 off DAPT and a trial of protonix. She reports not feeling 100% when leaving the hospital but because her hemoglobin has improved that she was discharged. She has continued to feel mildly dizzy since discharge.   Today she noted sudden onset of chest pressure and feeling like she was going to pass out. She also had numbness in her hands and knees which was similar to the sx she had prior to her 2007 stent placement. She became alarmed and called EMS. The pressure was substernal and not associated with nausea, vomiting, diaphoresis or SOB. It was constant and finally relieved after 5 SL NTG. She reports taking 3 SL NTG at home with no relief and finally relief after 2 given by EMS. She now has no pressure or tingling sensations. Troponin negative x1, ECG with RBBB unchanged from previous tracings.   Problem List  Past Medical History  Diagnosis Date  .  Coronary artery disease 09/2005    s/p TAXUS DRUG-ELUTING STENT PLACEMENT TO THE LEFT ANTERIOR DESCENDING ARTERY  . Hypertension   . Diabetes mellitus     TYPE II  . Hyperlipidemia   . Osteoarthrosis, unspecified whether generalized or localized, unspecified site   . Thyroid disease     HYPOTHYROIDISM  . History of hysterectomy     Past Surgical History  Procedure Laterality Date  . Median nerve repair  2009    DECOMPRESSION...RIGHT AND LEFT DECOMPRESSION  . Appendectomy    . Cataract extraction    . Cholecystectomy    . Hernia repair    . Knee surgery    . Coronary stent placement  09/2005    PLACEMENT TO LEFT ANTERIOR DESCENDING ARTERY  . Esophagogastroduodenoscopy N/A 06/17/2014    Procedure: ESOPHAGOGASTRODUODENOSCOPY (EGD);  Surgeon: Missy Sabins, MD;  Location: [facility] ENDOSCOPY;  Service: Endoscopy;  Laterality: N/A;     Allergies  Allergies  Allergen Reactions  . Penicillins Other (See Comments)    weakness  . Demerol [Meperidine] Other (See Comments)    unknown  . Meperidine Hcl Other (See Comments)    unknown     Home Medications  Prior to Admission medications   Medication Sig Start Date End Date Taking? Authorizing Provider  aspirin 325 MG tablet Take 325 mg by mouth once.   Yes Historical Provider, MD  clopidogrel (PLAVIX) 75 MG tablet Take 75 mg by mouth daily.  Yes Historical Provider, MD  furosemide (LASIX) 40 MG tablet Take 20 mg by mouth 2 (two) times daily.    Yes Historical Provider, MD  hydrocortisone valerate cream (WESTCORT) 0.2 % Apply 1 application topically as needed. For skin iritation 05/05/14  Yes Historical Provider, MD  insulin glargine (LANTUS) 100 UNIT/ML injection Inject 50 Units into the skin at bedtime.   Yes Historical Provider, MD  insulin lispro protamine-lispro (HUMALOG 75/25 MIX) (75-25) 100 UNIT/ML SUSP injection Inject 10 Units into the skin 3 (three) times daily.    Yes Historical Provider, MD  levothyroxine (SYNTHROID, LEVOTHROID)  200 MCG tablet Take 200 mcg by mouth daily.     Yes Historical Provider, MD  metFORMIN (GLUCOPHAGE) 500 MG tablet Take 1,000 mg by mouth 2 (two) times daily with a meal. 2 in the morning 2 in the evening. 06/04/12  Yes Historical Provider, MD  metoprolol (LOPRESSOR) 50 MG tablet Take 1 tablet (50 mg total) by mouth 2 (two) times daily. 07/28/11  Yes Hillary Bow, MD  nitroGLYCERIN (NITROSTAT) 0.4 MG SL tablet TAKE 1 TABLET UNDER TONGUE AT ONSET OF CHEST PAIN; MAY REPEAT IN 5 MIN FOR UP TO 3 DOSES. 05/23/11  Yes Blane Ohara, MD  pantoprazole (PROTONIX) 40 MG tablet Take 1 tablet (40 mg total) by mouth 2 (two) times daily. 06/18/14  Yes [name], MD  potassium chloride (K-DUR,KLOR-CON) 10 MEQ tablet Take 1 tablet by mouth daily. 12/16/11  Yes Historical Provider, MD  simvastatin (ZOCOR) 20 MG tablet Take 1 tablet by mouth daily at 6 PM.  12/24/11  Yes Historical Provider, MD    Family History  Family History  Problem Relation Age of Onset  . Diabetes    . Hypertension     No family status information on file.     Social History  History   Social History  . Marital Status: Married    Spouse Name: N/A    Number of Children: N/A  . 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 Smoking status: Never Smoker   . Smokeless tobacco: Not on file  . Alcohol Use: No  . Drug Use: No  . Sexual Activity: Not on file   Other Topics Concern  . Not on file   Social History Narrative     All other systems reviewed and are otherwise negative except as noted above.  Physical Exam  Blood pressure 136/53, pulse 67, resp. rate 17, SpO2 97 %.  General: Pleasant, NAD Psych: Normal affect. Neuro: Alert and oriented X 3. Moves all extremities spontaneously. HEENT: Normal  Neck: Supple without bruits or JVD. Lungs:  Resp regular and unlabored, CTA. Heart: RRR no s3, s4, or murmurs. Abdomen: Soft, non-tender,  non-distended, BS + x 4.  Extremities: No clubbing, cyanosis or edema. DP/PT/Radials 2+ and equal bilaterally.  Labs   Recent Labs  06/20/14 1128  TROPONINI <0.03   Lab Results  Component Value Date   WBC 6.4 06/20/2014   HGB 8.6* 06/20/2014   HCT 24.8* 06/20/2014   MCV 88.6 06/20/2014   PLT 162 06/20/2014    Recent Labs Lab 06/20/14 1128  NA 139  K 3.7  CL 108  CO2 26  BUN 7  CREATININE 0.82  CALCIUM 8.6  PROT 5.9*  BILITOT 0.5  ALKPHOS 117  ALT 17  AST 29  GLUCOSE 116*   Lab Results  Component Value Date  CHOL 128 02/01/2013   HDL 47.70 02/01/2013   LDLCALC 59 02/01/2013   TRIG 109.0 02/01/2013     Radiology/Studies  Dg Chest 2 View  06/20/2014   CLINICAL DATA:  Hematemesis for 4 days.  Chest pain.  EXAM: CHEST - 2 VIEW  COMPARISON:  One-view chest 06/16/2014  FINDINGS: The heart size and mediastinal contours are within normal limits. Both lungs are clear. The visualized skeletal structures are unremarkable.  IMPRESSION: Negative two view chest x-ray   Electronically Signed   By: Lawrence Santiago M.D.   On: 06/20/2014 12:43   Dg Chest Portable 1 View  06/16/2014   CLINICAL DATA:  Acute onset of hematemesis.  Initial encounter.  EXAM: PORTABLE CHEST - 1 VIEW  COMPARISON:  Chest radiograph performed 05/09/2010  FINDINGS: The lungs are well-aerated and clear. There is no evidence of focal opacification, pleural effusion or pneumothorax.  The cardiomediastinal silhouette is within normal limits. No acute osseous abnormalities are seen. There is nonspecific lucency within the humeral heads, more prominent on the left, which appears grossly stable from 2008.  IMPRESSION: No acute cardiopulmonary process seen.   Electronically Signed   By: Garald Balding M.D.   On: 06/16/2014 21:31    ECG   ECG with RBBB unchanged from previous tracings.    ASSESSMENT AND PLAN  CONNY SITU is a 80 y.o. female with a history of CAD s/p DES to LAD (2007) s/p DES for ISR of LAD  (2011), HTN, DM, and HLD who presented to [facility] via EMS today for chest pain as well as numbness to 4 and 5th digits to right and left hands, bilateral knee numbness.  Chest pain- atypical but per patient report this is a similar presentation to when she presented prior to her MI. She was recently taken off her DAPT for GI bleed and has a history of acute stent restenosis off DAPT for orthopedic surgery. --Troponin neg x1, ECG with no acute ST or TW changes.  -- Recent upper endoscopy with no evidence of acute bleed. Would restart ASA 81 (previously on 367m) + plavix.  -- Cont simvastatin and lopressor 558mBID  Recent coffee ground emesis-  Patient very concerned with her Hg dropping. 8.6 today. This could still be equilibrating from her recent GI bleed. Will add iron supplementation  --  Recent upper endoscopy with no evidence of acute bleed. FOBT negative.  -- Continue protonix  DM- continue home meds  HTN- well controlled on metoprolol 5074mID  Hypothyroidism- continue syncthroid   HLD- continue statin    Discharge from the ED back on DAPT with ASA 60m46md Plavix as well as iron supplementation ferrous sulfate 325mg22m. Will see her back in the office on 06/25/14 with me and repeat lab work at that time.   SigneJudy PimpleC 06/20/2014, 1:11 PM  Pager [phone] seen and examined and history reviewed. Agree with above findings and plan. 79 [id number] with history outlined above. Recent admission with Upper GIB but EGD showed only mild antral gastritis. Presents today with nonspecific symptoms of numbness in arms and knees and lightheadedness. Was concerned because when she had her cardiac problems in the past she felt numb all over. Cardiac evaluation today is benign. Ecg is without change. Normal troponin. Hgb is a little lower at 8.6. No symptoms of recurrent bleeding. She states she has felt weak since earlier admission. I think a lot of this may be related to her  persistent  anemia. I have recommended starting her on iron therapy. Will recheck CBC in one week. Since no active source of bleeding noted on EGD I would start her back on DAPT with Plavix 75 mg daily and ASA 81 mg daily. She has a history of stent thrombosis when off DAPT and she has a stent within a stent in her LAD. She is still on Protonix. She is stable to be DC home from ED from my standpoint.   Peter Martinique, Lyons 06/20/2014 2:32 PM

## 2014-06-20 NOTE — Telephone Encounter (Signed)
New Message        Pt's husband calling to notify Dr. Johnsie Cancel that pt is being taken to [facility] due to her heart.

## 2014-06-20 NOTE — Telephone Encounter (Signed)
No answer. Message routed to Dr. Johnsie Cancel.

## 2014-06-20 NOTE — Discharge Instructions (Signed)

## 2014-06-23 ENCOUNTER — Telehealth: Payer: Self-pay | Admitting: *Deleted

## 2014-06-23 NOTE — Telephone Encounter (Signed)
Needs f/u with primary and GI for anemia      PT  NOTIFIED   NEEDS  TO F/U WITH  DR  ARONSON   RE  ANEMIA ./CY

## 2014-06-25 ENCOUNTER — Ambulatory Visit (INDEPENDENT_AMBULATORY_CARE_PROVIDER_SITE_OTHER): Payer: Medicare Other | Admitting: Physician Assistant

## 2014-06-25 ENCOUNTER — Encounter: Payer: Self-pay | Admitting: Physician Assistant

## 2014-06-25 VITALS — BP 124/68 | HR 63 | Ht 66.0 in | Wt 203.2 lb

## 2014-06-25 DIAGNOSIS — D509 Iron deficiency anemia, unspecified: Secondary | ICD-10-CM | POA: Diagnosis not present

## 2014-06-25 LAB — CBC WITH DIFFERENTIAL/PLATELET
Basophils Absolute: 0 10*3/uL (ref 0.0–0.1)
Basophils Relative: 0.5 % (ref 0.0–3.0)
Eosinophils Absolute: 0.2 10*3/uL (ref 0.0–0.7)
Eosinophils Relative: 2.6 % (ref 0.0–5.0)
HCT: 31.1 % — ABNORMAL LOW (ref 36.0–46.0)
Hemoglobin: 10.3 g/dL — ABNORMAL LOW (ref 12.0–15.0)
Lymphocytes Relative: 30.5 % (ref 12.0–46.0)
Lymphs Abs: 2.1 10*3/uL (ref 0.7–4.0)
MCHC: 33.1 g/dL (ref 30.0–36.0)
MCV: 91.3 fl (ref 78.0–100.0)
MONO ABS: 0.6 10*3/uL (ref 0.1–1.0)
Monocytes Relative: 8.4 % (ref 3.0–12.0)
NEUTROS ABS: 4 10*3/uL (ref 1.4–7.7)
NEUTROS PCT: 58 % (ref 43.0–77.0)
Platelets: 240 10*3/uL (ref 150.0–400.0)
RBC: 3.41 Mil/uL — AB (ref 3.87–5.11)
RDW: 14 % (ref 11.5–15.5)
WBC: 7 10*3/uL (ref 4.0–10.5)

## 2014-06-25 NOTE — Progress Notes (Signed)
Cardiology Office Note   Date:  06/25/2014   ID:  Tonya Hunter, DOB 1935-01-18, MRN [id number]  PCP:  Tonya Lyons, MD  Cardiologist:  Dr. Romero Hunter hospital follow up for chest pain.    History of Present Illness:  Tonya Hunter is a 80 y.o. female with a history of CAD s/p DES to LAD (2007) s/p DES for ISR of LAD (2011), HTN, DM, and HLD who was recently discharged from the [facility] ED for chest pain and arm/leg numbness. She was sent home the same day with close follow up in the clinic. Today she presents for post hospital follow up.  She had a first generation DES placed to her LAD in 2007. In 2011, she presented with acute stent thrombosis following a orthopedic procedure and stopping her DAPT. She underwent urgent catheterization, and had a clotted first generation drug-eluting stent. She underwent repeat dilatation with placement of a second stent, has done well since that time. She was recently admitted to [facility] for evaluation of coffee ground emesis, she had an endoscopy done which did not show any abnormalities to explain the bleeding. She was discharged on 06/18/2014 off DAPT and a trial of protonix. She reports not feeling 100% when leaving the hospital but because her hemoglobin had improved that she was discharged. She continued to feel mildly dizzy since discharge.   She presented again to the [facility] ED on 06/20/14 after sudden onset of chest pressure and feeling like she was going to pass out and numbness. She was concerned because when she had her cardiac problems in the past she felt numb all over, which prompted her to call EMS. The pressure was substernal and not associated with nausea, vomiting, diaphoresis or SOB. It was constant and finally relieved after 5 SL NTG. She reports taking 3 SL NTG at home with no relief and finally relief after 2 given by EMS. Troponin negative x1, ECG with RBBB unchanged from previous tracings. Hgb was a little lower at 8.6. No symptoms of  recurrent bleeding. She reported feeling weak since earlier admission. She was seen by Dr. Martinique who felt that her symptoms were most likely related to her persistent anemia and recommended starting her on iron therapy. Since there was no active source of bleeding noted on EGD, it was recommended that she resume DAPT with Plavix 75 mg daily and ASA 81 mg daily. She has a history of stent thrombosis when off DAPT and she has a stent within a stent in her LAD. She was felt stable to be sent home from the ED.   Today she presents for post hospital follow up. She reports slight pressure and numbness behind her eyes. She has has a little weakness, mostly with exertion. No more chest pain. No SOB. No dizziness or syncope. No bleeding in her stool and urine. Her stool does look "smokey looking." Does have some slight abdominal discomfort. No LE swelling, orthopnea and PND. She is scheduled to see her PCP, Dr. Reynaldo Hunter today.    Past Medical History  Diagnosis Date  . Coronary artery disease 09/2005    s/p TAXUS DRUG-ELUTING STENT PLACEMENT TO THE LEFT ANTERIOR DESCENDING ARTERY  . Hypertension   . Diabetes mellitus     TYPE II  . Hyperlipidemia   . Osteoarthrosis, unspecified whether generalized or localized, unspecified site   . Thyroid disease     HYPOTHYROIDISM  . History of hysterectomy     Past Surgical History  Procedure Laterality Date  . Median nerve repair  2009    DECOMPRESSION...RIGHT AND LEFT DECOMPRESSION  . Appendectomy    . Cataract extraction    . Cholecystectomy    . Hernia repair    . Knee surgery    . Coronary stent placement  09/2005    PLACEMENT TO LEFT ANTERIOR DESCENDING ARTERY  . Esophagogastroduodenoscopy N/A 06/17/2014    Procedure: ESOPHAGOGASTRODUODENOSCOPY (EGD);  Surgeon: Tonya Sabins, MD;  Location: [facility] ENDOSCOPY;  Service: Endoscopy;  Laterality: N/A;     Current Outpatient Prescriptions  Medication Sig Dispense Refill  . aspirin 325 MG tablet Take 325 mg by  mouth once.    . clopidogrel (PLAVIX) 75 MG tablet Take 1 tablet (75 mg total) by mouth daily. 30 tablet 0  . ferrous sulfate 325 (65 FE) MG tablet Take 1 tablet (325 mg total) by mouth daily. 30 tablet 0  . furosemide (LASIX) 40 MG tablet Take 20 mg by mouth 2 (two) times daily.     . hydrocortisone valerate cream (WESTCORT) 0.2 % Apply 1 application topically as needed. For skin iritation    . insulin glargine (LANTUS) 100 UNIT/ML injection Inject 50 Units into the skin at bedtime.    . insulin lispro protamine-lispro (HUMALOG 75/25 MIX) (75-25) 100 UNIT/ML SUSP injection Inject 10 Units into the skin 3 (three) times daily.     Marland Kitchen levothyroxine (SYNTHROID, LEVOTHROID) 200 MCG tablet Take 200 mcg by mouth daily.      . metFORMIN (GLUCOPHAGE) 500 MG tablet Take 1,000 mg by mouth 2 (two) times daily with a meal. 2 in the morning 2 in the evening.    . metoprolol (LOPRESSOR) 50 MG tablet Take 1 tablet (50 mg total) by mouth 2 (two) times daily. 180 tablet 2  . nitroGLYCERIN (NITROSTAT) 0.4 MG SL tablet TAKE 1 TABLET UNDER TONGUE AT ONSET OF CHEST PAIN; MAY REPEAT IN 5 MIN FOR UP TO 3 DOSES. 25 tablet 2  . pantoprazole (PROTONIX) 40 MG tablet Take 1 tablet (40 mg total) by mouth 2 (two) times daily. 60 tablet 3  . potassium chloride (K-DUR,KLOR-CON) 10 MEQ tablet Take 1 tablet by mouth daily.    . simvastatin (ZOCOR) 20 MG tablet Take 1 tablet by mouth daily at 6 PM.      No current facility-administered medications for this visit.    Allergies:   Demerol; Penicillins; and Meperidine hcl    Social History:  The patient  reports that she has never smoked. She does not have any smokeless tobacco history on file. She reports that she does not drink alcohol or use illicit drugs.   Family History:  The patient's family history includes Cancer in her mother; Heart attack in her father.    ROS:  Please see the history of present illness.   Otherwise, review of systems are negative.  All other systems  are reviewed and negative.    PHYSICAL EXAM: VS:  BP 124/68 mmHg  Pulse 63  Ht 5' 6"  (1.676 m)  Wt 203 lb 3.2 oz (92.171 kg)  BMI 32.81 kg/m2  SpO2 99% , BMI Body mass index is 32.81 kg/(m^2). GEN: Well nourished, well developed, in no acute distress HEENT: normal Neck: no JVD, carotid bruits, or masses Cardiac: RRR; no murmurs, rubs, or gallops,no edema  Respiratory:  clear to auscultation bilaterally, normal work of breathing GI: soft, nontender, nondistended, + BS MS: no deformity or atrophy Skin: warm and dry, no rash Neuro:  Strength and sensation  are intact Psych: euthymic mood, full affect   EKG:  EKG is not ordered today.   Recent Labs: 06/20/2014: ALT 17; BUN 7; Creatinine 0.82; Hemoglobin 8.6*; Platelets 162; Potassium 3.7; Sodium 139    Lipid Panel    Component Value Date/Time   CHOL 128 02/01/2013 0810   TRIG 109.0 02/01/2013 0810   HDL 47.70 02/01/2013 0810   CHOLHDL 3 02/01/2013 0810   VLDL 21.8 02/01/2013 0810   LDLCALC 59 02/01/2013 0810      Wt Readings from Last 3 Encounters:  06/25/14 203 lb 3.2 oz (92.171 kg)  06/16/14 196 lb (88.905 kg)  04/07/14 203 lb 12.8 oz (92.443 kg)      Other studies Reviewed: Additional studies/ records that were reviewed today include: LHC, 2007, 2001. EGD Review of the above records demonstrates:  CAD s/p DES to LAD (2007) s/p DES for ISR of LAD (2011). Mild antral gastritis on EGD.   ASSESSMENT AND PLAN:  Tonya Hunter is a 80 y.o. female with a history of CAD s/p DES to LAD (2007) s/p DES for ISR of LAD (2011), HTN, DM, and HLD who was recently discharged from the [facility] ED for chest pain and arm/leg numbness. She was sent home the same day with close follow up in the clinic. Today she presents for post hospital follow up.   CAD- chest pain now resolved. No further episodes since being seen in the ED last week.  -- Recent UGIB and DAPT discontinued. She had an upper endoscopy with no evidence of acute bleed. She  was restarted on ASA 81 (previously on 343m) + plavix on 06/20/14. Tolerating this well  -- Cont ASA, simvastatin and lopressor 555mBID  Recent coffee ground emesis-8.6 in the ED 06/20/14. This could still be equilibrating from her recent GI bleed. Placed on iron supplementation  -- Recent upper endoscopy with no evidence of acute bleed. FOBT negative.  -- Continue protonix -- Continue iron supp. Will repeat CBC today and she will follow with Dr. ArReynaldo Miniumbout this  DM- continue home meds  HTN- Well controlled today: BP 124/68; HR 63. Continue metoprolol 5070mID  Hypothyroidism- continue syncthroid  HLD- continue statin    Current medicines are reviewed at length with the patient today.  The patient does not have concerns regarding medicines.  The following changes have been made:  no change  Labs/ tests ordered today include: CBC  Orders Placed This Encounter  Procedures  . CBC with Differential    Disposition:   FU with Dr. NisJohnsie Cancel 3-6 months  Signed, THOCrista Luria/02/2015 10:25 AM    [email or web address]  [id number]: [phone]: [phone]

## 2014-06-25 NOTE — Patient Instructions (Signed)
Your physician recommends that you continue on your current medications as directed. Please refer to the Current Medication list given to you today.  Your physician recommends that you get labs today, CBC.  Your physician recommends that you schedule a follow-up appointment in: 3 months with Dr. Johnsie Cancel.

## 2014-07-17 DIAGNOSIS — Z6832 Body mass index (BMI) 32.0-32.9, adult: Secondary | ICD-10-CM | POA: Diagnosis not present

## 2014-07-17 DIAGNOSIS — K922 Gastrointestinal hemorrhage, unspecified: Secondary | ICD-10-CM | POA: Diagnosis not present

## 2014-07-17 DIAGNOSIS — E1159 Type 2 diabetes mellitus with other circulatory complications: Secondary | ICD-10-CM | POA: Diagnosis not present

## 2014-07-17 DIAGNOSIS — I10 Essential (primary) hypertension: Secondary | ICD-10-CM | POA: Diagnosis not present

## 2014-07-17 DIAGNOSIS — Z9861 Coronary angioplasty status: Secondary | ICD-10-CM | POA: Diagnosis not present

## 2014-08-20 DIAGNOSIS — Z6832 Body mass index (BMI) 32.0-32.9, adult: Secondary | ICD-10-CM | POA: Diagnosis not present

## 2014-08-20 DIAGNOSIS — N390 Urinary tract infection, site not specified: Secondary | ICD-10-CM | POA: Diagnosis not present

## 2014-08-20 DIAGNOSIS — R8290 Unspecified abnormal findings in urine: Secondary | ICD-10-CM | POA: Diagnosis not present

## 2014-08-20 DIAGNOSIS — R109 Unspecified abdominal pain: Secondary | ICD-10-CM | POA: Diagnosis not present

## 2014-08-25 DIAGNOSIS — F329 Major depressive disorder, single episode, unspecified: Secondary | ICD-10-CM | POA: Diagnosis not present

## 2014-08-25 DIAGNOSIS — I10 Essential (primary) hypertension: Secondary | ICD-10-CM | POA: Diagnosis not present

## 2014-08-25 DIAGNOSIS — E782 Mixed hyperlipidemia: Secondary | ICD-10-CM | POA: Diagnosis not present

## 2014-08-25 DIAGNOSIS — E039 Hypothyroidism, unspecified: Secondary | ICD-10-CM | POA: Diagnosis not present

## 2014-08-25 DIAGNOSIS — Z9861 Coronary angioplasty status: Secondary | ICD-10-CM | POA: Diagnosis not present

## 2014-08-25 DIAGNOSIS — E1159 Type 2 diabetes mellitus with other circulatory complications: Secondary | ICD-10-CM | POA: Diagnosis not present

## 2014-08-25 DIAGNOSIS — Z6831 Body mass index (BMI) 31.0-31.9, adult: Secondary | ICD-10-CM | POA: Diagnosis not present

## 2014-08-25 DIAGNOSIS — Z1389 Encounter for screening for other disorder: Secondary | ICD-10-CM | POA: Diagnosis not present

## 2014-08-26 DIAGNOSIS — E11349 Type 2 diabetes mellitus with severe nonproliferative diabetic retinopathy without macular edema: Secondary | ICD-10-CM | POA: Diagnosis not present

## 2014-08-26 DIAGNOSIS — E11341 Type 2 diabetes mellitus with severe nonproliferative diabetic retinopathy with macular edema: Secondary | ICD-10-CM | POA: Diagnosis not present

## 2014-08-27 DIAGNOSIS — H6122 Impacted cerumen, left ear: Secondary | ICD-10-CM | POA: Diagnosis not present

## 2014-08-27 DIAGNOSIS — H903 Sensorineural hearing loss, bilateral: Secondary | ICD-10-CM | POA: Diagnosis not present

## 2014-09-02 DIAGNOSIS — E1139 Type 2 diabetes mellitus with other diabetic ophthalmic complication: Secondary | ICD-10-CM | POA: Diagnosis not present

## 2014-09-02 DIAGNOSIS — E11341 Type 2 diabetes mellitus with severe nonproliferative diabetic retinopathy with macular edema: Secondary | ICD-10-CM | POA: Diagnosis not present

## 2014-09-02 DIAGNOSIS — E11349 Type 2 diabetes mellitus with severe nonproliferative diabetic retinopathy without macular edema: Secondary | ICD-10-CM | POA: Diagnosis not present

## 2014-10-03 NOTE — Progress Notes (Signed)
Patient ID: Tonya Hunter, female   DOB: 1934/08/22, 80 y.o.   MRN: [id number]    Cardiology Office Note   Date:  10/06/2014   ID:  Tonya Hunter, DOB 1935/01/27, MRN [id number]  PCP:  [name], MD  Cardiologist:  Dr. Romero Belling hospital follow up for chest pain.    History of Present Illness:  Tonya Hunter is a 80 y.o. female with a history of CAD s/p DES to LAD (2007) s/p DES for ISR of LAD (2011), HTN, DM, and HLD who was recently discharged from the [facility] ED for chest pain and arm/leg numbness. She was sent home the same day with close follow up in the clinic. Today she presents for post hospital follow up.  She had a first generation DES placed to her LAD in 2007. In 2011, she presented with acute stent thrombosis following a orthopedic procedure and stopping her DAPT. She underwent urgent catheterization, and had a clotted first generation drug-eluting stent. She underwent repeat dilatation with placement of a second stent, has done well since that time. She was recently admitted to [facility] for evaluation of coffee ground emesis, she had an endoscopy done which did not show any abnormalities to explain the bleeding. She was discharged on 06/18/2014 off DAPT and a trial of protonix. She reports not feeling 100% when leaving the hospital but because her hemoglobin had improved that she was discharged. She continued to feel mildly dizzy since discharge.   She presented again to the [facility] ED on 06/20/14 after sudden onset of chest pressure and feeling like she was going to pass out and numbness. She was concerned because when she had her cardiac problems in the past she felt numb all over, which prompted her to call EMS. The pressure was substernal and not associated with nausea, vomiting, diaphoresis or SOB. It was constant and finally relieved after 5 SL NTG. She reports taking 3 SL NTG at home with no relief and finally relief after 2 given by EMS. Troponin negative x1, ECG with RBBB  unchanged from previous tracings. Hgb was a little lower at 8.6. No symptoms of recurrent bleeding. She reported feeling weak since earlier admission. She was seen by Dr. Martinique who felt that her symptoms were most likely related to her persistent anemia and recommended starting her on iron therapy. Since there was no active source of bleeding noted on EGD, it was recommended that she resume DAPT with Plavix 75 mg daily and ASA 81 mg daily. She has a history of stent thrombosis when off DAPT and she has a stent within a stent in her LAD. She was felt stable to be sent home from the ED.   Saw PA 07/01/14 and complained of  slight pressure and numbness behind her eyes. She has has a little weakness, mostly with exertion. No more chest pain. No SOB. No dizziness or syncope. No bleeding in her stool and urine. Her stool does look "smokey looking." Does have some slight abdominal discomfort. No LE swelling, orthopnea and PND.    Past Medical History  Diagnosis Date  . Coronary artery disease 09/2005    s/p TAXUS DRUG-ELUTING STENT PLACEMENT TO THE LEFT ANTERIOR DESCENDING ARTERY  . Hypertension   . Diabetes mellitus     TYPE II  . Hyperlipidemia   . Osteoarthrosis, unspecified whether generalized or localized, unspecified site   . Thyroid disease     HYPOTHYROIDISM  . History of hysterectomy  Past Surgical History  Procedure Laterality Date  . Median nerve repair  2009    DECOMPRESSION...RIGHT AND LEFT DECOMPRESSION  . Appendectomy    . Cataract extraction    . Cholecystectomy    . Hernia repair    . Knee surgery    . Coronary stent placement  09/2005    PLACEMENT TO LEFT ANTERIOR DESCENDING ARTERY  . Esophagogastroduodenoscopy N/A 06/17/2014    Procedure: ESOPHAGOGASTRODUODENOSCOPY (EGD);  Surgeon: Missy Sabins, MD;  Location: [facility] ENDOSCOPY;  Service: Endoscopy;  Laterality: N/A;     Current Outpatient Prescriptions  Medication Sig Dispense Refill  . aspirin 81 MG tablet Take 81 mg by  mouth daily.    . clopidogrel (PLAVIX) 75 MG tablet Take 1 tablet (75 mg total) by mouth daily. 30 tablet 0  . ferrous sulfate 325 (65 FE) MG tablet Take 1 tablet (325 mg total) by mouth daily. 30 tablet 0  . furosemide (LASIX) 40 MG tablet Take 20 mg by mouth 2 (two) times daily.     . hydrocortisone valerate cream (WESTCORT) 0.2 % Apply 1 application topically as needed. For skin iritation    . insulin glargine (LANTUS) 100 UNIT/ML injection Inject 50 Units into the skin at bedtime.    . insulin lispro protamine-lispro (HUMALOG 75/25 MIX) (75-25) 100 UNIT/ML SUSP injection Inject 10 Units into the skin 3 (three) times daily.     Marland Kitchen levothyroxine (SYNTHROID, LEVOTHROID) 200 MCG tablet Take 200 mcg by mouth daily.      . metFORMIN (GLUCOPHAGE) 500 MG tablet Take 1,000 mg by mouth 2 (two) times daily with a meal.     . metFORMIN (GLUCOPHAGE) 500 MG tablet Take 1,000 mg by mouth 2 (two) times daily with a meal.    . nitroGLYCERIN (NITROSTAT) 0.4 MG SL tablet TAKE 1 TABLET UNDER TONGUE AT ONSET OF CHEST PAIN; MAY REPEAT IN 5 MIN FOR UP TO 3 DOSES. 25 tablet 2  . pantoprazole (PROTONIX) 40 MG tablet Take 40 mg by mouth daily.    . potassium chloride (K-DUR,KLOR-CON) 10 MEQ tablet Take 1 tablet by mouth daily.    . simvastatin (ZOCOR) 20 MG tablet Take 1 tablet by mouth daily at 6 PM.      No current facility-administered medications for this visit.    Allergies:   Demerol; Penicillins; and Meperidine hcl    Social History:  The patient  reports that she has never smoked. She does not have any smokeless tobacco history on file. She reports that she does not drink alcohol or use illicit drugs.   Family History:  The patient's family history includes Cancer in her mother; Heart attack in her father.    ROS:  Please see the history of present illness.   Otherwise, review of systems are negative.  All other systems are reviewed and negative.    PHYSICAL EXAM: VS:  BP 118/60 mmHg  Pulse 61  Ht  5' 6"  (1.676 m)  Wt 90.629 kg (199 lb 12.8 oz)  BMI 32.26 kg/m2 , BMI Body mass index is 32.26 kg/(m^2). GEN: Well nourished, well developed, in no acute distress HEENT: normal Neck: no JVD, carotid bruits, or masses Cardiac: RRR; no murmurs, rubs, or gallops,no edema  Respiratory:  clear to auscultation bilaterally, normal work of breathing GI: soft, nontender, nondistended, + BS MS: no deformity or atrophy Skin: warm and dry, no rash Neuro:  Strength and sensation are intact Psych: euthymic mood, full affect   EKG:  EKG is not  ordered today.   Recent Labs: 06/20/2014: ALT 17; BUN 7; Creatinine 0.82; Potassium 3.7; Sodium 139 06/25/2014: Hemoglobin 10.3*; Platelets 240.0    Lipid Panel    Component Value Date/Time   CHOL 128 02/01/2013 0810   TRIG 109.0 02/01/2013 0810   HDL 47.70 02/01/2013 0810   CHOLHDL 3 02/01/2013 0810   VLDL 21.8 02/01/2013 0810   LDLCALC 59 02/01/2013 0810      Wt Readings from Last 3 Encounters:  10/06/14 90.629 kg (199 lb 12.8 oz)  06/25/14 92.171 kg (203 lb 3.2 oz)  06/16/14 88.905 kg (196 lb)      Other studies Reviewed: Additional studies/ records that were reviewed today include: LHC, 2007, 2001. EGD Review of the above records demonstrates:  CAD s/p DES to LAD (2007) s/p DES for ISR of LAD (2011). Mild antral gastritis on EGD.   ASSESSMENT AND PLAN:  Tonya Hunter is a 80 y.o. female with a history of CAD s/p DES to LAD (2007) s/p DES for ISR of LAD (2011), HTN, DM, and HLD who was recently discharged from the [facility] ED for chest pain and arm/leg numbness. She was sent home the same day with close follow up in the clinic. Today she presents for post hospital follow up.   CAD- chest pain now resolved. No further episodes since being seen in the ED last week.  -- Recent UGIB and DAPT discontinued. She had an upper endoscopy with no evidence of acute bleed. She was restarted on ASA 81 (previously on 379m) + plavix on 06/20/14. Tolerating this  well  -- Cont ASA, simvastatin and lopressor 511mBID f/u lexiscan myovue given known CAD and risk of restenosis.  She describes a bad reaction to  Previous cardiolite likely done with adenosine and not lexiscan.    Recent coffee ground emesis-8.6 in the ED 06/20/14. This could still be equilibrating from her recent GI bleed. Placed on iron supplementation  -- Recent upper endoscopy with no evidence of acute bleed. FOBT negative.  -- Continue protonix -- Continue iron supp.  F/U HaAmedeo Plenty ArReynaldo Minium DM- continue home meds  HTN- Well controlled today: BP 124/68; HR 63. Continue metoprolol 5068mID  Hypothyroidism- continue synthroid  HLD- continue statin    Current medicines are reviewed at length with the patient today.  The patient does not have concerns regarding medicines.  The following changes have been made:  no change  Labs/ tests ordered today include: Lexiscan myovue   No orders of the defined types were placed in this encounter.    Disposition:   FU with me in 6 months if myovue low risk   Signed, PetJenkins RougeD  10/06/2014 11:53 AM    [facility] [address]  [id number]: [phone]: [phone]

## 2014-10-06 ENCOUNTER — Encounter: Payer: Self-pay | Admitting: Cardiovascular Disease

## 2014-10-06 ENCOUNTER — Ambulatory Visit (INDEPENDENT_AMBULATORY_CARE_PROVIDER_SITE_OTHER): Payer: Medicare Other | Admitting: Cardiovascular Disease

## 2014-10-06 VITALS — BP 118/60 | HR 61 | Ht 66.0 in | Wt 199.8 lb

## 2014-10-06 DIAGNOSIS — R0789 Other chest pain: Secondary | ICD-10-CM

## 2014-10-06 NOTE — Patient Instructions (Signed)
Medication Instructions:  NO CHANGES  Labwork: NONE  Testing/Procedures: Your physician has requested that you have a lexiscan myoview. For further information please visit [email or web address]. Please follow instruction sheet, as given.   Follow-Up: Your physician wants you to follow-up in:   [place] will receive a reminder letter in the mail two months in advance. If you don't receive a letter, please call our office to schedule the follow-up appointment.  Any Other Special Instructions Will Be Listed Below (If Applicable).

## 2014-10-15 ENCOUNTER — Telehealth (HOSPITAL_COMMUNITY): Payer: Self-pay

## 2014-10-15 NOTE — Telephone Encounter (Signed)
Encounter complete. 

## 2014-10-17 ENCOUNTER — Ambulatory Visit (HOSPITAL_COMMUNITY)
Admission: RE | Admit: 2014-10-17 | Discharge: 2014-10-17 | Disposition: A | Discharge status: Home / Self Care | Payer: Medicare Other | Source: Ambulatory Visit | Attending: Cardiovascular Disease | Admitting: Cardiovascular Disease

## 2014-10-17 ENCOUNTER — Other Ambulatory Visit: Payer: Self-pay | Admitting: Cardiovascular Disease

## 2014-10-17 DIAGNOSIS — R0789 Other chest pain: Secondary | ICD-10-CM | POA: Diagnosis not present

## 2014-10-17 DIAGNOSIS — I4510 Unspecified right bundle-branch block: Secondary | ICD-10-CM | POA: Diagnosis not present

## 2014-10-17 DIAGNOSIS — R9439 Abnormal result of other cardiovascular function study: Secondary | ICD-10-CM | POA: Insufficient documentation

## 2014-10-17 LAB — MYOCARDIAL PERFUSION IMAGING
CHL CUP NUCLEAR SDS: 3
CHL CUP NUCLEAR SRS: 8
CHL CUP RESTING HR STRESS: 59 {beats}/min
CHL CUP STRESS STAGE 1 GRADE: 0 %
CHL CUP STRESS STAGE 1 SBP: 132 mmHg
CHL CUP STRESS STAGE 1 SPEED: 0 mph
CHL CUP STRESS STAGE 2 SPEED: 0 mph
CHL CUP STRESS STAGE 4 GRADE: 0 %
CHL CUP STRESS STAGE 4 SPEED: 0 mph
Estimated workload: 1 METS
LV dias vol: 117 mL
LV sys vol: 64 mL
NUC STRESS TID: 1.15
Nuc Stress EF: 46 %
Peak HR: 68 {beats}/min
Percent of predicted max HR: 47 %
SSS: 11
Stage 1 DBP: 65 mmHg
Stage 1 HR: 57 {beats}/min
Stage 2 Grade: 0 %
Stage 2 HR: 57 {beats}/min
Stage 3 Grade: 0 %
Stage 3 HR: 68 {beats}/min
Stage 3 Speed: 0 mph
Stage 4 DBP: 67 mmHg
Stage 4 HR: 65 {beats}/min
Stage 4 SBP: 153 mmHg

## 2014-10-17 MED ORDER — REGADENOSON 0.4 MG/5ML IV SOLN
0.4000 mg | Freq: Once | INTRAVENOUS | Status: AC
Start: 2014-10-17 — End: 2014-10-17
  Administered 2014-10-17: 0.4 mg via INTRAVENOUS

## 2014-10-17 MED ORDER — TECHNETIUM TC 99M SESTAMIBI GENERIC - CARDIOLITE
30.6000 | Freq: Once | INTRAVENOUS | Status: AC | PRN
  Administered 2014-10-17: 31 via INTRAVENOUS

## 2014-10-17 MED ORDER — TECHNETIUM TC 99M SESTAMIBI GENERIC - CARDIOLITE
10.5000 | Freq: Once | INTRAVENOUS | Status: AC | PRN
  Administered 2014-10-17: 11 via INTRAVENOUS

## 2014-10-17 MED ORDER — AMINOPHYLLINE 25 MG/ML IV SOLN
75.0000 mg | Freq: Once | INTRAVENOUS | Status: AC
  Administered 2014-10-17: 75 mg via INTRAVENOUS

## 2014-10-22 ENCOUNTER — Telehealth: Payer: Self-pay | Admitting: Cardiovascular Disease

## 2014-10-22 NOTE — Telephone Encounter (Signed)
PT AWARE OF MYOVIEW RESULTS./CY 

## 2014-10-22 NOTE — Telephone Encounter (Signed)
New message      Returning a call to Altha Harm to get test results

## 2014-11-20 DIAGNOSIS — L821 Other seborrheic keratosis: Secondary | ICD-10-CM | POA: Diagnosis not present

## 2014-11-20 DIAGNOSIS — Z85828 Personal history of other malignant neoplasm of skin: Secondary | ICD-10-CM | POA: Diagnosis not present

## 2014-11-20 DIAGNOSIS — L718 Other rosacea: Secondary | ICD-10-CM | POA: Diagnosis not present

## 2014-11-20 DIAGNOSIS — L570 Actinic keratosis: Secondary | ICD-10-CM | POA: Diagnosis not present

## 2014-11-20 DIAGNOSIS — L820 Inflamed seborrheic keratosis: Secondary | ICD-10-CM | POA: Diagnosis not present

## 2014-11-20 DIAGNOSIS — L920 Granuloma annulare: Secondary | ICD-10-CM | POA: Diagnosis not present

## 2014-12-15 DIAGNOSIS — L718 Other rosacea: Secondary | ICD-10-CM | POA: Diagnosis not present

## 2014-12-15 DIAGNOSIS — Z85828 Personal history of other malignant neoplasm of skin: Secondary | ICD-10-CM | POA: Diagnosis not present

## 2015-01-05 DIAGNOSIS — E11349 Type 2 diabetes mellitus with severe nonproliferative diabetic retinopathy without macular edema: Secondary | ICD-10-CM | POA: Diagnosis not present

## 2015-01-05 DIAGNOSIS — E11341 Type 2 diabetes mellitus with severe nonproliferative diabetic retinopathy with macular edema: Secondary | ICD-10-CM | POA: Diagnosis not present

## 2015-01-05 DIAGNOSIS — H3531 Nonexudative age-related macular degeneration: Secondary | ICD-10-CM | POA: Diagnosis not present

## 2015-01-07 DIAGNOSIS — Z6831 Body mass index (BMI) 31.0-31.9, adult: Secondary | ICD-10-CM | POA: Diagnosis not present

## 2015-01-07 DIAGNOSIS — E1159 Type 2 diabetes mellitus with other circulatory complications: Secondary | ICD-10-CM | POA: Diagnosis not present

## 2015-01-07 DIAGNOSIS — F329 Major depressive disorder, single episode, unspecified: Secondary | ICD-10-CM | POA: Diagnosis not present

## 2015-01-07 DIAGNOSIS — I10 Essential (primary) hypertension: Secondary | ICD-10-CM | POA: Diagnosis not present

## 2015-01-07 DIAGNOSIS — Z9861 Coronary angioplasty status: Secondary | ICD-10-CM | POA: Diagnosis not present

## 2015-02-25 ENCOUNTER — Encounter: Payer: Self-pay | Admitting: Cardiovascular Disease

## 2015-03-02 DIAGNOSIS — I10 Essential (primary) hypertension: Secondary | ICD-10-CM | POA: Diagnosis not present

## 2015-03-02 DIAGNOSIS — E1159 Type 2 diabetes mellitus with other circulatory complications: Secondary | ICD-10-CM | POA: Diagnosis not present

## 2015-03-02 DIAGNOSIS — E039 Hypothyroidism, unspecified: Secondary | ICD-10-CM | POA: Diagnosis not present

## 2015-03-02 DIAGNOSIS — N390 Urinary tract infection, site not specified: Secondary | ICD-10-CM | POA: Diagnosis not present

## 2015-03-02 DIAGNOSIS — R8290 Unspecified abnormal findings in urine: Secondary | ICD-10-CM | POA: Diagnosis not present

## 2015-03-06 DIAGNOSIS — E782 Mixed hyperlipidemia: Secondary | ICD-10-CM | POA: Diagnosis not present

## 2015-03-06 DIAGNOSIS — E1159 Type 2 diabetes mellitus with other circulatory complications: Secondary | ICD-10-CM | POA: Diagnosis not present

## 2015-03-06 DIAGNOSIS — I10 Essential (primary) hypertension: Secondary | ICD-10-CM | POA: Diagnosis not present

## 2015-03-06 DIAGNOSIS — Z23 Encounter for immunization: Secondary | ICD-10-CM | POA: Diagnosis not present

## 2015-03-06 DIAGNOSIS — E039 Hypothyroidism, unspecified: Secondary | ICD-10-CM | POA: Diagnosis not present

## 2015-03-06 DIAGNOSIS — F329 Major depressive disorder, single episode, unspecified: Secondary | ICD-10-CM | POA: Diagnosis not present

## 2015-03-06 DIAGNOSIS — Z0000 Encounter for general adult medical examination without abnormal findings: Secondary | ICD-10-CM | POA: Diagnosis not present

## 2015-03-06 DIAGNOSIS — Z6830 Body mass index (BMI) 30.0-30.9, adult: Secondary | ICD-10-CM | POA: Diagnosis not present

## 2015-03-06 DIAGNOSIS — Z9861 Coronary angioplasty status: Secondary | ICD-10-CM | POA: Diagnosis not present

## 2015-03-06 DIAGNOSIS — D692 Other nonthrombocytopenic purpura: Secondary | ICD-10-CM | POA: Diagnosis not present

## 2015-04-10 ENCOUNTER — Ambulatory Visit: Payer: Medicare Other | Admitting: Cardiovascular Disease

## 2015-04-20 NOTE — Progress Notes (Signed)
Patient ID: Tonya Hunter, female   DOB: 05/24/1934, 80 y.o.   MRN: [id number]    Cardiology Office Note   Date:  04/23/2015   ID:  Tonya Hunter, DOB [birth year], MRN [id number]  PCP:  [name], MD  Cardiologist:  Dr. Romero Belling hospital follow up for chest pain.    History of Present Illness:  Tonya Hunter is a 80 y.o. female with a history of CAD s/p DES to LAD (2007) s/p DES for ISR of LAD (2011), HTN, DM, and HLD   She had a first generation DES placed to her LAD in 2007. In 2011, she presented with acute stent thrombosis following a orthopedic procedure and stopping her DAPT. She underwent urgent catheterization, and had a clotted first generation drug-eluting stent. She underwent repeat dilatation with placement of a second stent, has done well since that time. She was  admitted to [facility] for evaluation of coffee ground emesis, she had an endoscopy done which did not show any abnormalities to explain the bleeding. She was discharged on 06/18/2014 off DAPT and a trial of protonix. She reports not feeling 100% when leaving the hospital but because her hemoglobin had improved that she was discharged. She continued to feel mildly dizzy since discharge.   She presented again to the [facility] ED on 06/20/14 after sudden onset of chest pressure and feeling like she was going to pass out and numbness. She was concerned because when she had her cardiac problems in the past she felt numb all over, which prompted her to call EMS. The pressure was substernal and not associated with nausea, vomiting, diaphoresis or SOB. It was constant and finally relieved after 5 SL NTG. She reports taking 3 SL NTG at home with no relief and finally relief after 2 given by EMS. Troponin negative x1, ECG with RBBB unchanged from previous tracings. Hgb was a little lower at 8.6. No symptoms of recurrent bleeding. She reported feeling weak since earlier admission. She was seen by Dr. Martinique who felt that her symptoms  were most likely related to her persistent anemia and recommended starting her on iron therapy. Since there was no active source of bleeding noted on EGD, it was recommended that she resume DAPT with Plavix 75 mg daily and ASA 81 mg daily. She has a history of stent thrombosis when off DAPT and she has a stent within a stent in her LAD. She was felt stable to be sent home from the ED.   F/U Myovue was done 10/17/14 and low risk: Low risk pharmacological stress nuclear study with a moderate anteroapical scar with minimal periinfarct ischemia and corresponding hypokinesis with mildly reduced global LV systolic function.  Sees Tonya Hunter as primary a1c in 9-10 range too high    Past Medical History  Diagnosis Date  . Coronary artery disease 09/2005    s/p TAXUS DRUG-ELUTING STENT PLACEMENT TO THE LEFT ANTERIOR DESCENDING ARTERY  . Hypertension   . Diabetes mellitus     TYPE II  . Hyperlipidemia   . Osteoarthrosis, unspecified whether generalized or localized, unspecified site   . Thyroid disease     HYPOTHYROIDISM  . History of hysterectomy     Past Surgical History  Procedure Laterality Date  . Median nerve repair  2009    DECOMPRESSION...RIGHT AND LEFT DECOMPRESSION  . Appendectomy    . Cataract extraction    . Cholecystectomy    . Hernia repair    . Knee surgery    .  Coronary stent placement  09/2005    PLACEMENT TO LEFT ANTERIOR DESCENDING ARTERY  . Esophagogastroduodenoscopy N/A 06/17/2014    Procedure: ESOPHAGOGASTRODUODENOSCOPY (EGD);  Surgeon: Missy Sabins, MD;  Location: [facility] ENDOSCOPY;  Service: Endoscopy;  Laterality: N/A;     Current Outpatient Prescriptions  Medication Sig Dispense Refill  . aspirin 81 MG tablet Take 81 mg by mouth daily.    . clopidogrel (PLAVIX) 75 MG tablet Take 1 tablet (75 mg total) by mouth daily. 30 tablet 0  . furosemide (LASIX) 40 MG tablet Take 20 mg by mouth 2 (two) times daily.     . hydrocortisone valerate cream (WESTCORT) 0.2 % Apply 1  application topically daily as needed. For skin iritation    . insulin glargine (LANTUS) 100 UNIT/ML injection Inject 50 Units into the skin at bedtime.    . insulin lispro protamine-lispro (HUMALOG 75/25 MIX) (75-25) 100 UNIT/ML SUSP injection Inject 10 Units into the skin 3 (three) times daily.     Marland Kitchen levothyroxine (SYNTHROID, LEVOTHROID) 200 MCG tablet Take 200 mcg by mouth daily.      . metFORMIN (GLUCOPHAGE) 500 MG tablet Take 1,000 mg by mouth 2 (two) times daily with a meal.    . metoprolol (LOPRESSOR) 50 MG tablet Take 50 mg by mouth 2 (two) times daily.  2  . nitroGLYCERIN (NITROSTAT) 0.4 MG SL tablet TAKE 1 TABLET UNDER TONGUE AT ONSET OF CHEST PAIN; MAY REPEAT IN 5 MIN FOR UP TO 3 DOSES. 25 tablet 2  . potassium chloride (K-DUR,KLOR-CON) 10 MEQ tablet Take 1 tablet by mouth daily.    . simvastatin (ZOCOR) 20 MG tablet Take 1 tablet by mouth daily at 6 PM.      No current facility-administered medications for this visit.    Allergies:   Demerol; Penicillins; and Meperidine hcl    Social History:  The patient  reports that she has never smoked. She does not have any smokeless tobacco history on file. She reports that she does not drink alcohol or use illicit drugs.   Family History:  The patient's family history includes Cancer in her mother; Heart attack in her father.    ROS:  Please see the history of present illness.   Otherwise, review of systems are negative.  All other systems are reviewed and negative.    PHYSICAL EXAM: VS:  BP 130/60 mmHg  Pulse 56  Ht 5' 6"  (1.676 m)  Wt 90.175 kg (198 lb 12.8 oz)  BMI 32.10 kg/m2 , BMI Body mass index is 32.1 kg/(m^2). GEN: Well nourished, well developed, in no acute distress HEENT: normal Neck: no JVD, carotid bruits, or masses Cardiac: RRR; no murmurs, rubs, or gallops,no edema  Respiratory:  clear to auscultation bilaterally, normal work of breathing GI: soft, nontender, nondistended, + BS MS: no deformity or atrophy Skin:  warm and dry, no rash Neuro:  Strength and sensation are intact Psych: euthymic mood, full affect   EKG:  EKG is not ordered today.   Recent Labs: 06/20/2014: ALT 17; BUN 7; Creatinine, Ser 0.82; Potassium 3.7; Sodium 139 06/25/2014: Hemoglobin 10.3*; Platelets 240.0    Lipid Panel    Component Value Date/Time   CHOL 128 02/01/2013 0810   TRIG 109.0 02/01/2013 0810   HDL 47.70 02/01/2013 0810   CHOLHDL 3 02/01/2013 0810   VLDL 21.8 02/01/2013 0810   LDLCALC 59 02/01/2013 0810      Wt Readings from Last 3 Encounters:  04/23/15 90.175 kg (198 lb 12.8 oz)  10/17/14 90.266 kg (199 lb)  10/06/14 90.629 kg (199 lb 12.8 oz)      Other studies Reviewed: Additional studies/ records that were reviewed today include: LHC, 2007, 2001. EGD Review of the above records demonstrates:  CAD s/p DES to LAD (2007) s/p DES for ISR of LAD (2011). Mild antral gastritis on EGD.   ASSESSMENT AND PLAN:  Tonya Hunter is a 80 y.o. female with a history of CAD s/p DES to LAD (2007) s/p DES for ISR of LAD (2011)   CAD- chest pain now resolved. No further episodes  -- Recent UGIB and DAPT discontinued. She had an upper endoscopy with no evidence of acute bleed. She was restarted on ASA 81 (previously on 352m) + plavix on 06/20/14. Tolerating this well  -- Cont ASA, simvastatin and lopressor 548mBID low risk myovue June  Stable   Coffee ground emesis-8.6 in the ED 06/20/14. This could still be equilibrating from her recent GI bleed. Placed on iron supplementation  -- Recent upper endoscopy with no evidence of acute bleed. FOBT negative.  -- Continue protonix -- Continue iron supp.  F/U HaAmedeo Plenty ArReynaldo Hunter -  Lab Results  Component Value Date   HCT 31.1* 06/25/2014     DM- continue home meds needs to take humalog with meals on a regular basis   HTN- Well controlled today: BP 124/68; HR 63. Continue metoprolol 5038mID  Hypothyroidism- continue synthroid  HLD- continue statin     Current medicines are reviewed at length with the patient today.  The patient does not have concerns regarding medicines.  The following changes have been made:  no change  Labs/ tests ordered today include: Lexiscan myovue   ECG: 06/20/14  SR RBBB no acute changes    Disposition:   FU with me in 6 months   Signed, PetJenkins RougeD  04/23/2015 8:19 AM    [facility] [address]  [phone]: [phone]: [phone]

## 2015-04-23 ENCOUNTER — Ambulatory Visit (INDEPENDENT_AMBULATORY_CARE_PROVIDER_SITE_OTHER): Payer: Medicare Other | Admitting: Cardiovascular Disease

## 2015-04-23 ENCOUNTER — Encounter: Payer: Self-pay | Admitting: Cardiovascular Disease

## 2015-04-23 VITALS — BP 130/60 | HR 56 | Ht 66.0 in | Wt 198.8 lb

## 2015-04-23 DIAGNOSIS — I10 Essential (primary) hypertension: Secondary | ICD-10-CM

## 2015-04-23 DIAGNOSIS — I2510 Atherosclerotic heart disease of native coronary artery without angina pectoris: Secondary | ICD-10-CM

## 2015-04-23 NOTE — Patient Instructions (Addendum)

## 2015-04-30 DIAGNOSIS — L820 Inflamed seborrheic keratosis: Secondary | ICD-10-CM | POA: Diagnosis not present

## 2015-04-30 DIAGNOSIS — L718 Other rosacea: Secondary | ICD-10-CM | POA: Diagnosis not present

## 2015-04-30 DIAGNOSIS — L920 Granuloma annulare: Secondary | ICD-10-CM | POA: Diagnosis not present

## 2015-04-30 DIAGNOSIS — Z85828 Personal history of other malignant neoplasm of skin: Secondary | ICD-10-CM | POA: Diagnosis not present

## 2015-04-30 DIAGNOSIS — L570 Actinic keratosis: Secondary | ICD-10-CM | POA: Diagnosis not present

## 2015-04-30 DIAGNOSIS — L821 Other seborrheic keratosis: Secondary | ICD-10-CM | POA: Diagnosis not present

## 2015-07-09 DIAGNOSIS — E113411 Type 2 diabetes mellitus with severe nonproliferative diabetic retinopathy with macular edema, right eye: Secondary | ICD-10-CM | POA: Diagnosis not present

## 2015-07-09 DIAGNOSIS — E113412 Type 2 diabetes mellitus with severe nonproliferative diabetic retinopathy with macular edema, left eye: Secondary | ICD-10-CM | POA: Diagnosis not present

## 2015-07-09 DIAGNOSIS — H35362 Drusen (degenerative) of macula, left eye: Secondary | ICD-10-CM | POA: Diagnosis not present

## 2015-07-09 DIAGNOSIS — H353131 Nonexudative age-related macular degeneration, bilateral, early dry stage: Secondary | ICD-10-CM | POA: Diagnosis not present

## 2015-07-10 DIAGNOSIS — E1159 Type 2 diabetes mellitus with other circulatory complications: Secondary | ICD-10-CM | POA: Diagnosis not present

## 2015-07-10 DIAGNOSIS — Z1389 Encounter for screening for other disorder: Secondary | ICD-10-CM | POA: Diagnosis not present

## 2015-07-10 DIAGNOSIS — I10 Essential (primary) hypertension: Secondary | ICD-10-CM | POA: Diagnosis not present

## 2015-07-10 DIAGNOSIS — Z6831 Body mass index (BMI) 31.0-31.9, adult: Secondary | ICD-10-CM | POA: Diagnosis not present

## 2015-07-16 DIAGNOSIS — E113411 Type 2 diabetes mellitus with severe nonproliferative diabetic retinopathy with macular edema, right eye: Secondary | ICD-10-CM | POA: Diagnosis not present

## 2015-08-04 DIAGNOSIS — H6122 Impacted cerumen, left ear: Secondary | ICD-10-CM | POA: Diagnosis not present

## 2015-08-04 DIAGNOSIS — H903 Sensorineural hearing loss, bilateral: Secondary | ICD-10-CM | POA: Diagnosis not present

## 2015-10-27 DIAGNOSIS — E113411 Type 2 diabetes mellitus with severe nonproliferative diabetic retinopathy with macular edema, right eye: Secondary | ICD-10-CM | POA: Diagnosis not present

## 2015-10-27 DIAGNOSIS — H5371 Glare sensitivity: Secondary | ICD-10-CM | POA: Diagnosis not present

## 2015-10-27 DIAGNOSIS — E113412 Type 2 diabetes mellitus with severe nonproliferative diabetic retinopathy with macular edema, left eye: Secondary | ICD-10-CM | POA: Diagnosis not present

## 2015-10-27 DIAGNOSIS — H353131 Nonexudative age-related macular degeneration, bilateral, early dry stage: Secondary | ICD-10-CM | POA: Diagnosis not present

## 2015-11-23 DIAGNOSIS — Z6830 Body mass index (BMI) 30.0-30.9, adult: Secondary | ICD-10-CM | POA: Diagnosis not present

## 2015-11-23 DIAGNOSIS — Z9861 Coronary angioplasty status: Secondary | ICD-10-CM | POA: Diagnosis not present

## 2015-11-23 DIAGNOSIS — R945 Abnormal results of liver function studies: Secondary | ICD-10-CM | POA: Diagnosis not present

## 2015-11-23 DIAGNOSIS — E782 Mixed hyperlipidemia: Secondary | ICD-10-CM | POA: Diagnosis not present

## 2015-11-23 DIAGNOSIS — D692 Other nonthrombocytopenic purpura: Secondary | ICD-10-CM | POA: Diagnosis not present

## 2015-11-23 DIAGNOSIS — I10 Essential (primary) hypertension: Secondary | ICD-10-CM | POA: Diagnosis not present

## 2015-11-23 DIAGNOSIS — E669 Obesity, unspecified: Secondary | ICD-10-CM | POA: Diagnosis not present

## 2015-11-23 DIAGNOSIS — E1159 Type 2 diabetes mellitus with other circulatory complications: Secondary | ICD-10-CM | POA: Diagnosis not present

## 2015-11-23 DIAGNOSIS — E038 Other specified hypothyroidism: Secondary | ICD-10-CM | POA: Diagnosis not present

## 2015-11-23 DIAGNOSIS — R160 Hepatomegaly, not elsewhere classified: Secondary | ICD-10-CM | POA: Diagnosis not present

## 2015-11-23 DIAGNOSIS — F329 Major depressive disorder, single episode, unspecified: Secondary | ICD-10-CM | POA: Diagnosis not present

## 2015-12-04 DIAGNOSIS — Z0100 Encounter for examination of eyes and vision without abnormal findings: Secondary | ICD-10-CM | POA: Diagnosis not present

## 2015-12-04 DIAGNOSIS — E113391 Type 2 diabetes mellitus with moderate nonproliferative diabetic retinopathy without macular edema, right eye: Secondary | ICD-10-CM | POA: Diagnosis not present

## 2015-12-04 DIAGNOSIS — H04123 Dry eye syndrome of bilateral lacrimal glands: Secondary | ICD-10-CM | POA: Diagnosis not present

## 2015-12-04 DIAGNOSIS — H353130 Nonexudative age-related macular degeneration, bilateral, stage unspecified: Secondary | ICD-10-CM | POA: Diagnosis not present

## 2015-12-15 DIAGNOSIS — Z85828 Personal history of other malignant neoplasm of skin: Secondary | ICD-10-CM | POA: Diagnosis not present

## 2015-12-15 DIAGNOSIS — L720 Epidermal cyst: Secondary | ICD-10-CM | POA: Diagnosis not present

## 2015-12-31 ENCOUNTER — Encounter: Payer: Self-pay | Admitting: Cardiovascular Disease

## 2015-12-31 DIAGNOSIS — E1159 Type 2 diabetes mellitus with other circulatory complications: Secondary | ICD-10-CM | POA: Diagnosis not present

## 2015-12-31 DIAGNOSIS — I10 Essential (primary) hypertension: Secondary | ICD-10-CM | POA: Diagnosis not present

## 2015-12-31 DIAGNOSIS — I2510 Atherosclerotic heart disease of native coronary artery without angina pectoris: Secondary | ICD-10-CM | POA: Diagnosis not present

## 2015-12-31 DIAGNOSIS — Z9861 Coronary angioplasty status: Secondary | ICD-10-CM | POA: Diagnosis not present

## 2015-12-31 DIAGNOSIS — I959 Hypotension, unspecified: Secondary | ICD-10-CM | POA: Diagnosis not present

## 2015-12-31 DIAGNOSIS — Z6831 Body mass index (BMI) 31.0-31.9, adult: Secondary | ICD-10-CM | POA: Diagnosis not present

## 2015-12-31 DIAGNOSIS — R911 Solitary pulmonary nodule: Secondary | ICD-10-CM | POA: Diagnosis not present

## 2015-12-31 DIAGNOSIS — E039 Hypothyroidism, unspecified: Secondary | ICD-10-CM | POA: Diagnosis not present

## 2015-12-31 DIAGNOSIS — I700 Atherosclerosis of aorta: Secondary | ICD-10-CM | POA: Diagnosis not present

## 2015-12-31 DIAGNOSIS — R0600 Dyspnea, unspecified: Secondary | ICD-10-CM | POA: Diagnosis not present

## 2015-12-31 DIAGNOSIS — R079 Chest pain, unspecified: Secondary | ICD-10-CM | POA: Diagnosis not present

## 2016-01-05 ENCOUNTER — Ambulatory Visit (INDEPENDENT_AMBULATORY_CARE_PROVIDER_SITE_OTHER): Payer: Medicare Other | Admitting: Cardiovascular Disease

## 2016-01-05 VITALS — BP 110/62 | HR 70 | Resp 18 | Ht 66.0 in | Wt 201.0 lb

## 2016-01-05 DIAGNOSIS — R0989 Other specified symptoms and signs involving the circulatory and respiratory systems: Secondary | ICD-10-CM

## 2016-01-05 NOTE — Patient Instructions (Addendum)
Medication Instructions:  Your physician recommends that you continue on your current medications as directed. Please refer to the Current Medication list given to you today.  Labwork: NONE  Testing/Procedures: Your physician has requested that you have a carotid duplex. This test is an ultrasound of the carotid arteries in your neck. It looks at blood flow through these arteries that supply the brain with blood. Allow one hour for this exam. There are no restrictions or special instructions.  Follow-Up: Your physician wants you to follow-up in: December with Dr. Johnsie Cancel.   If you need a refill on your cardiac medications before your next appointment, please call your pharmacy.

## 2016-01-05 NOTE — Progress Notes (Signed)
Patient ID: Tonya Hunter, female   DOB: Sep 26, 1934, 81 y.o.   MRN: [id number]    Cardiology Office Note   Date:  01/05/2016   ID:  Tonya Hunter, DOB 1935-03-01, MRN [id number]  PCP:  [name], MD  Cardiologist:  Dr. Romero Belling hospital follow up for chest pain.    History of Present Illness:  Tonya Hunter is a 81 y.o. female with a history of CAD s/p DES to LAD (2007) s/p DES for ISR of LAD (2011), HTN, DM, and HLD   She had a first generation DES placed to her LAD in 2007. In 2011, she presented with acute stent thrombosis following a orthopedic procedure and stopping her DAPT. She underwent urgent catheterization, and had a clotted first generation drug-eluting stent. She underwent repeat dilatation with placement of a second stent, has done well since that time. She was  admitted to [facility] for evaluation of coffee ground emesis, she had an endoscopy done which did not show any abnormalities to explain the bleeding. She was discharged on 06/18/2014 off DAPT and a trial of protonix. She reports not feeling 100% when leaving the hospital but because her hemoglobin had improved that she was discharged. She continued to feel mildly dizzy since discharge.   She presented again to the [facility] ED on 06/20/14 after sudden onset of chest pressure and feeling like she was going to pass out and numbness. She was concerned because when she had her cardiac problems in the past she felt numb all over, which prompted her to call EMS. The pressure was substernal and not associated with nausea, vomiting, diaphoresis or SOB. It was constant and finally relieved after 5 SL NTG. She reports taking 3 SL NTG at home with no relief and finally relief after 2 given by EMS. Troponin negative x1, ECG with RBBB unchanged from previous tracings. Hgb was a little lower at 8.6. No symptoms of recurrent bleeding. She reported feeling weak since earlier admission. She was seen by Dr. Martinique who felt that her symptoms  were most likely related to her persistent anemia and recommended starting her on iron therapy. Since there was no active source of bleeding noted on EGD, it was recommended that she resume DAPT with Plavix 75 mg daily and ASA 81 mg daily. She has a history of stent thrombosis when off DAPT and she has a stent within a stent in her LAD. She was felt stable to be sent home from the ED.   F/U Myovue was done 10/17/14 and low risk: Low risk pharmacological stress nuclear study with a moderate anteroapical scar with minimal periinfarct ischemia and corresponding hypokinesis with mildly reduced global LV systolic function.  Sees [name] as primary a1c in 9-10 range too high Recent bought of pleurisy helped  By [employer]. Thought she might have angina but clearly was pleuritic.  CTA negative   Past Medical History:  Diagnosis Date  . Coronary artery disease 09/2005   s/p TAXUS DRUG-ELUTING STENT PLACEMENT TO THE LEFT ANTERIOR DESCENDING ARTERY  . Diabetes mellitus    TYPE II  . History of hysterectomy   . Hyperlipidemia   . Hypertension   . Osteoarthrosis, unspecified whether generalized or localized, unspecified site   . Thyroid disease    HYPOTHYROIDISM    Past Surgical History:  Procedure Laterality Date  . APPENDECTOMY    . CATARACT EXTRACTION    . CHOLECYSTECTOMY    . CORONARY STENT PLACEMENT  09/2005  PLACEMENT TO LEFT ANTERIOR DESCENDING ARTERY  . ESOPHAGOGASTRODUODENOSCOPY N/A 06/17/2014   Procedure: ESOPHAGOGASTRODUODENOSCOPY (EGD);  Surgeon: Missy Sabins, MD;  Location: [facility] ENDOSCOPY;  Service: Endoscopy;  Laterality: N/A;  . HERNIA REPAIR    . KNEE SURGERY    . MEDIAN NERVE REPAIR  2009   DECOMPRESSION...RIGHT AND LEFT DECOMPRESSION     Current Outpatient Prescriptions  Medication Sig Dispense Refill  . aspirin 81 MG tablet Take 81 mg by mouth daily.    . clopidogrel (PLAVIX) 75 MG tablet Take 1 tablet (75 mg total) by mouth daily. 30 tablet 0  . furosemide (LASIX) 40 MG tablet  Take 20 mg by mouth 2 (two) times daily.     . hydrocortisone valerate cream (WESTCORT) 0.2 % Apply 1 application topically daily as needed. For skin iritation    . insulin glargine (LANTUS) 100 UNIT/ML injection Inject 50 Units into the skin at bedtime.    . insulin lispro protamine-lispro (HUMALOG 75/25 MIX) (75-25) 100 UNIT/ML SUSP injection Inject 10 Units into the skin 3 (three) times daily.     Marland Kitchen levothyroxine (SYNTHROID, LEVOTHROID) 200 MCG tablet Take 200 mcg by mouth daily.      . metFORMIN (GLUCOPHAGE) 500 MG tablet Take 1,000 mg by mouth 2 (two) times daily with a meal.    . metoprolol (LOPRESSOR) 50 MG tablet Take 50 mg by mouth 2 (two) times daily.  2  . nitroGLYCERIN (NITROSTAT) 0.4 MG SL tablet TAKE 1 TABLET UNDER TONGUE AT ONSET OF CHEST PAIN; MAY REPEAT IN 5 MIN FOR UP TO 3 DOSES. 25 tablet 2  . potassium chloride (K-DUR,KLOR-CON) 10 MEQ tablet Take 1 tablet by mouth daily.    . simvastatin (ZOCOR) 20 MG tablet Take 1 tablet by mouth daily at 6 PM.      No current facility-administered medications for this visit.     Allergies:   Demerol [meperidine]; Penicillins; and Meperidine hcl    Social History:  The patient  reports that she has never smoked. She does not have any smokeless tobacco history on file. She reports that she does not drink alcohol or use drugs.   Family History:  The patient's family history includes Cancer in her mother; Heart attack in her father.    ROS:  Please see the history of present illness.   Otherwise, review of systems are negative.  All other systems are reviewed and negative.    PHYSICAL EXAM: VS:  BP 110/62 (BP Location: Right Arm, Patient Position: Sitting, Cuff Size: Large)   Pulse 70   Resp 18   Ht 5' 6"  (1.676 m)   Wt 201 lb (91.2 kg)   SpO2 96%   BMI 32.44 kg/m  , BMI Body mass index is 32.44 kg/m. GEN: Well nourished, well developed, in no acute distress  HEENT: normal  Neck: no JVD,  Positive left carotid bruits, or  masses Cardiac: RRR; no murmurs, rubs, or gallops,no edema  Respiratory:  clear to auscultation bilaterally, normal work of breathing GI: soft, nontender, nondistended, + BS MS: no deformity or atrophy  Skin: warm and dry, no rash Neuro:  Strength and sensation are intact Psych: euthymic mood, full affect   EKG:  EKG is not ordered today.   Recent Labs: No results found for requested labs within last 8760 hours.    Lipid Panel    Component Value Date/Time   CHOL 128 02/01/2013 0810   TRIG 109.0 02/01/2013 0810   HDL 47.70 02/01/2013 0810  CHOLHDL 3 02/01/2013 0810   VLDL 21.8 02/01/2013 0810   LDLCALC 59 02/01/2013 0810      Wt Readings from Last 3 Encounters:  01/05/16 201 lb (91.2 kg)  04/23/15 198 lb 12.8 oz (90.2 kg)  10/17/14 199 lb (90.3 kg)      Other studies Reviewed: Additional studies/ records that were reviewed today include: LHC, 2007, 2001. EGD Review of the above records demonstrates:  CAD s/p DES to LAD (2007) s/p DES for ISR of LAD (2011). Mild antral gastritis on EGD.   ASSESSMENT AND PLAN:  Tonya Hunter is a 81 y.o. female with a history of CAD s/p DES to LAD (2007) s/p DES for ISR of LAD (2011)   CAD- chest pain now resolved. No further episodes  -- Recent UGIB and DAPT discontinued. She had an upper endoscopy with no evidence of acute bleed. She was restarted on ASA 81 (previously on 320m) + plavix on 06/20/14. Tolerating this well  -- Cont ASA, simvastatin and lopressor 575mBID low risk myovue June  Stable   Coffee ground emesis-8.6 in the ED 06/20/14. This could still be equilibrating from her recent GI bleed. Placed on iron supplementation  -- Recent upper endoscopy with no evidence of acute bleed. FOBT negative.  -- Continue protonix -- Continue iron supp.  F/U HaAmedeo Plenty ArReynaldo Minium -  Lab Results  Component Value Date   HCT 31.1 (L) 06/25/2014     DM- continue home meds needs to take humalog with meals on a regular basis  Discussed Consequences of poor control I think this may be contributing to some of her nausea  HTN- Well controlled today: BP 124/68; HR 63. Continue metoprolol 5043mID  Hypothyroidism- continue synthroid  HLD- continue statin   Pleurisy:  Resolved lung exam normal PRN NSAI's try to avoid steroids due to DM  Bruit: left sided f/u carotid duplex given known CAD    Current medicines are reviewed at length with the patient today.  The patient does not have concerns regarding medicines.  The following changes have been made:  no change  Labs/ tests ordered today include: Lexiscan myovue   ECG: 06/20/14  SR RBBB no acute changes    Disposition:   FU with me in 6 months   Signed, PetJenkins RougeD  01/05/2016 4:04 PM    [facility] [address]  [id number]: [phone]: [phone]

## 2016-01-11 ENCOUNTER — Ambulatory Visit (HOSPITAL_COMMUNITY)
Admission: RE | Admit: 2016-01-11 | Discharge: 2016-01-11 | Disposition: A | Discharge status: Home / Self Care | Payer: Medicare Other | Source: Ambulatory Visit | Attending: Cardiovascular Disease | Admitting: Cardiovascular Disease

## 2016-01-11 DIAGNOSIS — E785 Hyperlipidemia, unspecified: Secondary | ICD-10-CM | POA: Diagnosis not present

## 2016-01-11 DIAGNOSIS — I6523 Occlusion and stenosis of bilateral carotid arteries: Secondary | ICD-10-CM | POA: Insufficient documentation

## 2016-01-11 DIAGNOSIS — I10 Essential (primary) hypertension: Secondary | ICD-10-CM | POA: Diagnosis not present

## 2016-01-11 DIAGNOSIS — I2510 Atherosclerotic heart disease of native coronary artery without angina pectoris: Secondary | ICD-10-CM | POA: Diagnosis not present

## 2016-01-11 DIAGNOSIS — R0989 Other specified symptoms and signs involving the circulatory and respiratory systems: Secondary | ICD-10-CM | POA: Insufficient documentation

## 2016-01-11 DIAGNOSIS — R42 Dizziness and giddiness: Secondary | ICD-10-CM | POA: Insufficient documentation

## 2016-01-27 ENCOUNTER — Ambulatory Visit: Payer: Self-pay | Admitting: Cardiovascular Disease

## 2016-02-04 DIAGNOSIS — L738 Other specified follicular disorders: Secondary | ICD-10-CM | POA: Diagnosis not present

## 2016-02-04 DIAGNOSIS — L0889 Other specified local infections of the skin and subcutaneous tissue: Secondary | ICD-10-CM | POA: Diagnosis not present

## 2016-02-04 DIAGNOSIS — L820 Inflamed seborrheic keratosis: Secondary | ICD-10-CM | POA: Diagnosis not present

## 2016-02-04 DIAGNOSIS — Z85828 Personal history of other malignant neoplasm of skin: Secondary | ICD-10-CM | POA: Diagnosis not present

## 2016-02-29 DIAGNOSIS — E1159 Type 2 diabetes mellitus with other circulatory complications: Secondary | ICD-10-CM | POA: Diagnosis not present

## 2016-02-29 DIAGNOSIS — N390 Urinary tract infection, site not specified: Secondary | ICD-10-CM | POA: Diagnosis not present

## 2016-02-29 DIAGNOSIS — I10 Essential (primary) hypertension: Secondary | ICD-10-CM | POA: Diagnosis not present

## 2016-02-29 DIAGNOSIS — R8299 Other abnormal findings in urine: Secondary | ICD-10-CM | POA: Diagnosis not present

## 2016-02-29 DIAGNOSIS — E038 Other specified hypothyroidism: Secondary | ICD-10-CM | POA: Diagnosis not present

## 2016-03-07 DIAGNOSIS — R160 Hepatomegaly, not elsewhere classified: Secondary | ICD-10-CM | POA: Diagnosis not present

## 2016-03-07 DIAGNOSIS — E1159 Type 2 diabetes mellitus with other circulatory complications: Secondary | ICD-10-CM | POA: Diagnosis not present

## 2016-03-07 DIAGNOSIS — Z6831 Body mass index (BMI) 31.0-31.9, adult: Secondary | ICD-10-CM | POA: Diagnosis not present

## 2016-03-07 DIAGNOSIS — D692 Other nonthrombocytopenic purpura: Secondary | ICD-10-CM | POA: Diagnosis not present

## 2016-03-07 DIAGNOSIS — R945 Abnormal results of liver function studies: Secondary | ICD-10-CM | POA: Diagnosis not present

## 2016-03-07 DIAGNOSIS — E668 Other obesity: Secondary | ICD-10-CM | POA: Diagnosis not present

## 2016-03-07 DIAGNOSIS — Z0000 Encounter for general adult medical examination without abnormal findings: Secondary | ICD-10-CM | POA: Diagnosis not present

## 2016-03-07 DIAGNOSIS — I10 Essential (primary) hypertension: Secondary | ICD-10-CM | POA: Diagnosis not present

## 2016-03-07 DIAGNOSIS — Z789 Other specified health status: Secondary | ICD-10-CM | POA: Diagnosis not present

## 2016-03-07 DIAGNOSIS — E038 Other specified hypothyroidism: Secondary | ICD-10-CM | POA: Diagnosis not present

## 2016-03-07 DIAGNOSIS — E782 Mixed hyperlipidemia: Secondary | ICD-10-CM | POA: Diagnosis not present

## 2016-03-07 DIAGNOSIS — Z9861 Coronary angioplasty status: Secondary | ICD-10-CM | POA: Diagnosis not present

## 2016-03-14 ENCOUNTER — Other Ambulatory Visit: Payer: Self-pay | Admitting: Internal Medicine

## 2016-03-14 DIAGNOSIS — Z1231 Encounter for screening mammogram for malignant neoplasm of breast: Secondary | ICD-10-CM

## 2016-03-29 DIAGNOSIS — H353131 Nonexudative age-related macular degeneration, bilateral, early dry stage: Secondary | ICD-10-CM | POA: Diagnosis not present

## 2016-03-29 DIAGNOSIS — E113412 Type 2 diabetes mellitus with severe nonproliferative diabetic retinopathy with macular edema, left eye: Secondary | ICD-10-CM | POA: Diagnosis not present

## 2016-03-29 DIAGNOSIS — E113411 Type 2 diabetes mellitus with severe nonproliferative diabetic retinopathy with macular edema, right eye: Secondary | ICD-10-CM | POA: Diagnosis not present

## 2016-03-29 DIAGNOSIS — H35362 Drusen (degenerative) of macula, left eye: Secondary | ICD-10-CM | POA: Diagnosis not present

## 2016-04-04 DIAGNOSIS — L570 Actinic keratosis: Secondary | ICD-10-CM | POA: Diagnosis not present

## 2016-04-04 DIAGNOSIS — L820 Inflamed seborrheic keratosis: Secondary | ICD-10-CM | POA: Diagnosis not present

## 2016-04-04 DIAGNOSIS — L821 Other seborrheic keratosis: Secondary | ICD-10-CM | POA: Diagnosis not present

## 2016-04-04 DIAGNOSIS — L84 Corns and callosities: Secondary | ICD-10-CM | POA: Diagnosis not present

## 2016-04-04 DIAGNOSIS — L738 Other specified follicular disorders: Secondary | ICD-10-CM | POA: Diagnosis not present

## 2016-04-04 DIAGNOSIS — L218 Other seborrheic dermatitis: Secondary | ICD-10-CM | POA: Diagnosis not present

## 2016-04-04 DIAGNOSIS — Z85828 Personal history of other malignant neoplasm of skin: Secondary | ICD-10-CM | POA: Diagnosis not present

## 2016-04-20 ENCOUNTER — Ambulatory Visit
Admission: RE | Admit: 2016-04-20 | Discharge: 2016-04-20 | Disposition: A | Discharge status: Home / Self Care | Payer: Medicare Other | Source: Ambulatory Visit | Attending: Internal Medicine | Admitting: Internal Medicine

## 2016-04-20 DIAGNOSIS — Z1231 Encounter for screening mammogram for malignant neoplasm of breast: Secondary | ICD-10-CM | POA: Diagnosis not present

## 2016-04-25 DIAGNOSIS — Z85828 Personal history of other malignant neoplasm of skin: Secondary | ICD-10-CM | POA: Diagnosis not present

## 2016-04-25 DIAGNOSIS — L280 Lichen simplex chronicus: Secondary | ICD-10-CM | POA: Diagnosis not present

## 2016-04-26 NOTE — Progress Notes (Signed)
Patient ID: Tonya Hunter, female   DOB: July 28, 1934, 81 y.o.   MRN: [id number]    Cardiology Office Note   Date:  04/28/2016   ID:  Tonya Hunter, DOB 1934/08/29, MRN [id number]  PCP:  [name], MD  Cardiologist:  Dr. Romero Belling hospital follow up for chest pain.    History of Present Illness:  Tonya Hunter is a 81 y.o. female with a history of CAD s/p DES to LAD (2007) s/p DES for ISR of LAD (2011), HTN, DM, and HLD   She had a first generation DES placed to her LAD in 2007. In 2011, she presented with acute stent thrombosis following a orthopedic procedure and stopping her DAPT. She underwent urgent catheterization, and had a clotted first generation drug-eluting stent. She underwent repeat dilatation with placement of a second stent, has done well since that time. She was  admitted to [facility] for evaluation of coffee ground emesis, she had an endoscopy done which did not show any abnormalities to explain the bleeding. She was discharged on 06/18/2014 off DAPT and a trial of protonix. She reports not feeling 100% when leaving the hospital but because her hemoglobin had improved that she was discharged. She continued to feel mildly dizzy since discharge.   She presented again to the [facility] ED on 06/20/14 after sudden onset of chest pressure and feeling like she was going to pass out and numbness. She was concerned because when she had her cardiac problems in the past she felt numb all over, which prompted her to call EMS. The pressure was substernal and not associated with nausea, vomiting, diaphoresis or SOB. It was constant and finally relieved after 5 SL NTG. She reports taking 3 SL NTG at home with no relief and finally relief after 2 given by EMS. Troponin negative x1, ECG with RBBB unchanged from previous tracings. Hgb was a little lower at 8.6. No symptoms of recurrent bleeding. She reported feeling weak since earlier admission. She was seen by Dr. Martinique who felt that her symptoms  were most likely related to her persistent anemia and recommended starting her on iron therapy. Since there was no active source of bleeding noted on EGD, it was recommended that she resume DAPT with Plavix 75 mg daily and ASA 81 mg daily. She has a history of stent thrombosis when off DAPT and she has a stent within a stent in her LAD. She was felt stable to be sent home from the ED.   F/U Myovue was done 10/17/14 and low risk: Low risk pharmacological stress nuclear study with a moderate anteroapical scar with minimal periinfarct ischemia and corresponding hypokinesis with mildly reduced global LV systolic function.  Sees [name] as primary a1c in 9-10 range too high    Past Medical History:  Diagnosis Date  . Coronary artery disease 09/2005   s/p TAXUS DRUG-ELUTING STENT PLACEMENT TO THE LEFT ANTERIOR DESCENDING ARTERY  . Diabetes mellitus    TYPE II  . History of hysterectomy   . Hyperlipidemia   . Hypertension   . Osteoarthrosis, unspecified whether generalized or localized, unspecified site   . Thyroid disease    HYPOTHYROIDISM    Past Surgical History:  Procedure Laterality Date  . APPENDECTOMY    . CATARACT EXTRACTION    . CHOLECYSTECTOMY    . CORONARY STENT PLACEMENT  09/2005   PLACEMENT TO LEFT ANTERIOR DESCENDING ARTERY  . ESOPHAGOGASTRODUODENOSCOPY N/A 06/17/2014   Procedure: ESOPHAGOGASTRODUODENOSCOPY (EGD);  Surgeon: Jenny Reichmann  Charolette Forward, MD;  Location: [facility] ENDOSCOPY;  Service: Endoscopy;  Laterality: N/A;  . HERNIA REPAIR    . KNEE SURGERY    . MEDIAN NERVE REPAIR  2009   DECOMPRESSION...RIGHT AND LEFT DECOMPRESSION     Current Outpatient Prescriptions  Medication Sig Dispense Refill  . aspirin 81 MG tablet Take 81 mg by mouth daily.    . clopidogrel (PLAVIX) 75 MG tablet Take 1 tablet (75 mg total) by mouth daily. 30 tablet 0  . furosemide (LASIX) 40 MG tablet Take 20 mg by mouth 2 (two) times daily.     . hydrocortisone valerate cream (WESTCORT) 0.2 % Apply 1 application  topically daily as needed. For skin iritation    . insulin glargine (LANTUS) 100 UNIT/ML injection Inject 50 Units into the skin at bedtime.    . insulin lispro protamine-lispro (HUMALOG 75/25 MIX) (75-25) 100 UNIT/ML SUSP injection Inject 10 Units into the skin 3 (three) times daily.     Marland Kitchen levothyroxine (SYNTHROID, LEVOTHROID) 200 MCG tablet Take 200 mcg by mouth daily.      . metFORMIN (GLUCOPHAGE) 500 MG tablet Take 1,000 mg by mouth 2 (two) times daily with a meal.    . metoprolol (LOPRESSOR) 50 MG tablet Take 50 mg by mouth 2 (two) times daily.  2  . nitroGLYCERIN (NITROSTAT) 0.4 MG SL tablet TAKE 1 TABLET UNDER TONGUE AT ONSET OF CHEST PAIN; MAY REPEAT IN 5 MIN FOR UP TO 3 DOSES. 25 tablet 2  . potassium chloride (K-DUR,KLOR-CON) 10 MEQ tablet Take 1 tablet by mouth daily.    . simvastatin (ZOCOR) 20 MG tablet Take 1 tablet by mouth daily at 6 PM.      No current facility-administered medications for this visit.     Allergies:   Demerol [meperidine]; Penicillins; and Meperidine hcl    Social History:  The patient  reports that she has never smoked. She has never used smokeless tobacco. She reports that she does not drink alcohol or use drugs.   Family History:  The patient's family history includes Cancer in her mother; Heart attack in her father.    ROS:  Please see the history of present illness.   Otherwise, review of systems are negative.  All other systems are reviewed and negative.    PHYSICAL EXAM: VS:  BP 120/66   Pulse (!) 110   Ht 5' 6"  (1.676 m)   Wt 201 lb (91.2 kg)   SpO2 94%   BMI 32.44 kg/m  , BMI Body mass index is 32.44 kg/m. GEN: Well nourished, well developed, in no acute distress  HEENT: normal  Neck: no JVD,  Positive left carotid bruits, or masses Cardiac: RRR; no murmurs, rubs, or gallops,no edema  Respiratory:  clear to auscultation bilaterally, normal work of breathing GI: soft, nontender, nondistended, + BS MS: no deformity or atrophy  Skin:  warm and dry, no rash Neuro:  Strength and sensation are intact Psych: euthymic mood, full affect   EKG:  EKG is not ordered today.   Recent Labs: No results found for requested labs within last 8760 hours.    Lipid Panel    Component Value Date/Time   CHOL 128 02/01/2013 0810   TRIG 109.0 02/01/2013 0810   HDL 47.70 02/01/2013 0810   CHOLHDL 3 02/01/2013 0810   VLDL 21.8 02/01/2013 0810   LDLCALC 59 02/01/2013 0810      Wt Readings from Last 3 Encounters:  04/28/16 201 lb (91.2 kg)  01/05/16 201  lb (91.2 kg)  04/23/15 198 lb 12.8 oz (90.2 kg)      Other studies Reviewed: Additional studies/ records that were reviewed today include: LHC, 2007, 2001. EGD Review of the above records demonstrates:  CAD s/p DES to LAD (2007) s/p DES for ISR of LAD (2011). Mild antral gastritis on EGD.   ASSESSMENT AND PLAN:  Tonya Hunter is a 81 y.o. female with a history of CAD s/p DES to LAD (2007) s/p DES for ISR of LAD (2011)   CAD- chest pain now resolved. No further episodes  --  UGIB and DAPT discontinued. She had an upper endoscopy with no evidence of acute bleed. She was restarted on ASA 81 (previously on 349m) + plavix on 06/20/14. Tolerating this well  -- Cont ASA, simvastatin and lopressor 532mBID low risk myovue June  Stable   GI Bleed 06/20/14.  -- Continue iron supp.  F/U HaAmedeo Plenty ArReynaldo Minium -  Lab Results  Component Value Date   HCT 31.1 (L) 06/25/2014     DM- continue home meds needs to take humalog with meals on a regular basis Discussed Consequences of poor control I think this may be contributing to some of her nausea  HTN- Well controlled.  Continue current medications and low sodium Dash type diet.     Hypothyroidism- continue synthroid Lab Results  Component Value Date   TSH 0.96 02/01/2013     HLD- continue statin  Lab Results  Component Value Date   LDLCALC 59 02/01/2013     Bruit: left sided carotid done 8//28/17 with plaque no stenosis     Current medicines are reviewed at length with the patient today.  The patient does not have concerns regarding medicines.  The following changes have been made:  no change  Labs/ tests ordered today include: Lexiscan myovue   ECG: 12/31/15   SR RBBB no acute changes    Disposition:   FU with me in a year    [employer]

## 2016-04-28 ENCOUNTER — Ambulatory Visit (INDEPENDENT_AMBULATORY_CARE_PROVIDER_SITE_OTHER): Payer: Medicare Other | Admitting: Cardiovascular Disease

## 2016-04-28 ENCOUNTER — Encounter: Payer: Self-pay | Admitting: Cardiovascular Disease

## 2016-04-28 VITALS — BP 120/66 | HR 110 | Ht 66.0 in | Wt 201.0 lb

## 2016-04-28 DIAGNOSIS — I2510 Atherosclerotic heart disease of native coronary artery without angina pectoris: Secondary | ICD-10-CM

## 2016-04-28 NOTE — Patient Instructions (Addendum)

## 2016-05-16 DIAGNOSIS — K2980 Duodenitis without bleeding: Secondary | ICD-10-CM

## 2016-05-16 DIAGNOSIS — G459 Transient cerebral ischemic attack, unspecified: Secondary | ICD-10-CM

## 2016-05-16 HISTORY — DX: Duodenitis without bleeding: K29.80

## 2016-05-16 HISTORY — DX: Transient cerebral ischemic attack, unspecified: G45.9

## 2016-05-23 DIAGNOSIS — Z85828 Personal history of other malignant neoplasm of skin: Secondary | ICD-10-CM | POA: Diagnosis not present

## 2016-05-23 DIAGNOSIS — L718 Other rosacea: Secondary | ICD-10-CM | POA: Diagnosis not present

## 2016-05-23 DIAGNOSIS — L280 Lichen simplex chronicus: Secondary | ICD-10-CM | POA: Diagnosis not present

## 2016-06-03 ENCOUNTER — Emergency Department (HOSPITAL_COMMUNITY)
Admission: EM | Admit: 2016-06-03 | Discharge: 2016-06-03 | Disposition: A | Discharge status: Home / Self Care | Payer: Medicare Other | Attending: Emergency Medicine | Admitting: Emergency Medicine

## 2016-06-03 ENCOUNTER — Encounter (HOSPITAL_COMMUNITY): Payer: Self-pay

## 2016-06-03 DIAGNOSIS — E039 Hypothyroidism, unspecified: Secondary | ICD-10-CM | POA: Insufficient documentation

## 2016-06-03 DIAGNOSIS — E119 Type 2 diabetes mellitus without complications: Secondary | ICD-10-CM | POA: Diagnosis not present

## 2016-06-03 DIAGNOSIS — I10 Essential (primary) hypertension: Secondary | ICD-10-CM | POA: Insufficient documentation

## 2016-06-03 DIAGNOSIS — M7989 Other specified soft tissue disorders: Secondary | ICD-10-CM | POA: Diagnosis present

## 2016-06-03 DIAGNOSIS — Z7982 Long term (current) use of aspirin: Secondary | ICD-10-CM | POA: Diagnosis not present

## 2016-06-03 DIAGNOSIS — Z955 Presence of coronary angioplasty implant and graft: Secondary | ICD-10-CM | POA: Insufficient documentation

## 2016-06-03 DIAGNOSIS — Z794 Long term (current) use of insulin: Secondary | ICD-10-CM | POA: Insufficient documentation

## 2016-06-03 DIAGNOSIS — I2510 Atherosclerotic heart disease of native coronary artery without angina pectoris: Secondary | ICD-10-CM | POA: Insufficient documentation

## 2016-06-03 LAB — CBC WITH DIFFERENTIAL/PLATELET
BASOS PCT: 0 %
Basophils Absolute: 0 10*3/uL (ref 0.0–0.1)
Eosinophils Absolute: 0.2 10*3/uL (ref 0.0–0.7)
Eosinophils Relative: 3 %
HCT: 36.9 % (ref 36.0–46.0)
Hemoglobin: 12.5 g/dL (ref 12.0–15.0)
Lymphocytes Relative: 32 %
Lymphs Abs: 2.1 10*3/uL (ref 0.7–4.0)
MCH: 29.7 pg (ref 26.0–34.0)
MCHC: 33.9 g/dL (ref 30.0–36.0)
MCV: 87.6 fL (ref 78.0–100.0)
MONO ABS: 0.7 10*3/uL (ref 0.1–1.0)
MONOS PCT: 10 %
NEUTROS PCT: 55 %
Neutro Abs: 3.5 10*3/uL (ref 1.7–7.7)
Platelets: 178 10*3/uL (ref 150–400)
RBC: 4.21 MIL/uL (ref 3.87–5.11)
RDW: 13.4 % (ref 11.5–15.5)
WBC: 6.5 10*3/uL (ref 4.0–10.5)

## 2016-06-03 LAB — BASIC METABOLIC PANEL
Anion gap: 9 (ref 5–15)
BUN: 17 mg/dL (ref 6–20)
CALCIUM: 9.2 mg/dL (ref 8.9–10.3)
CO2: 24 mmol/L (ref 22–32)
CREATININE: 0.84 mg/dL (ref 0.44–1.00)
Chloride: 104 mmol/L (ref 101–111)
GFR calc Af Amer: >60 mL/min (ref >60)
Glucose, Bld: 137 mg/dL — ABNORMAL HIGH (ref 65–99)
Potassium: 4.2 mmol/L (ref 3.5–5.1)
SODIUM: 137 mmol/L (ref 135–145)

## 2016-06-03 MED ORDER — ENOXAPARIN SODIUM 100 MG/ML ~~LOC~~ SOLN
1.0000 mg/kg | Freq: Once | SUBCUTANEOUS | Status: AC
  Administered 2016-06-03: 85 mg via SUBCUTANEOUS
  Filled 2016-06-03: qty 1

## 2016-06-03 NOTE — ED Provider Notes (Signed)
[facility] DEPT Provider Note   CSN: [id number] Arrival date & time: 06/03/16  1934     History   Chief Complaint Chief Complaint  Patient presents with  . DVT    possible    HPI Tonya Hunter is a 82 y.o. female.  The history is provided by the patient.  Leg Pain   This is a new problem. The current episode started 2 days ago. The problem occurs constantly. The problem has been gradually worsening. The pain is present in the left lower leg and left foot. The pain is moderate. Pertinent negatives include no numbness, full range of motion, no stiffness, no tingling and no itching. Associated symptoms comments: Swelling. She has tried nothing for the symptoms. The treatment provided no relief. There has been no history of extremity trauma. Family history is significant for no rheumatoid arthritis and no gout.    Past Medical History:  Diagnosis Date  . Coronary artery disease 09/2005   s/p TAXUS DRUG-ELUTING STENT PLACEMENT TO THE LEFT ANTERIOR DESCENDING ARTERY  . Diabetes mellitus    TYPE II  . History of hysterectomy   . Hyperlipidemia   . Hypertension   . Osteoarthrosis, unspecified whether generalized or localized, unspecified site   . Thyroid disease    HYPOTHYROIDISM    Patient Active Problem List   Diagnosis Date Noted  . Upper gastrointestinal bleed 06/16/2014  . Upper GI bleed 06/16/2014  . NECK PAIN 10/28/2009  . OSTEOARTHRITIS 08/26/2008  . Hypothyroidism 08/26/2008  . IDDM (insulin dependent diabetes mellitus) ([place]) 01/03/2007  . Elevated lipids 01/03/2007  . Essential hypertension 01/03/2007  . Coronary atherosclerosis 01/03/2007    Past Surgical History:  Procedure Laterality Date  . APPENDECTOMY    . CATARACT EXTRACTION    . CHOLECYSTECTOMY    . CORONARY STENT PLACEMENT  09/2005   PLACEMENT TO LEFT ANTERIOR DESCENDING ARTERY  . ESOPHAGOGASTRODUODENOSCOPY N/A 06/17/2014   Procedure: ESOPHAGOGASTRODUODENOSCOPY (EGD);  Surgeon: Missy Sabins, MD;   Location: [facility] ENDOSCOPY;  Service: Endoscopy;  Laterality: N/A;  . HERNIA REPAIR    . KNEE SURGERY    . MEDIAN NERVE REPAIR  2009   DECOMPRESSION...RIGHT AND LEFT DECOMPRESSION    OB History    No data available       Home Medications    Prior to Admission medications   Medication Sig Start Date End Date Taking? Authorizing Provider  aspirin 81 MG tablet Take 81 mg by mouth daily.   Yes Historical Provider, MD  clopidogrel (PLAVIX) 75 MG tablet Take 1 tablet (75 mg total) by mouth daily. 06/20/14  Yes Delos Haring, PA-C  doxycycline (VIBRAMYCIN) 100 MG capsule Take 100 mg by mouth daily. 05/23/16  Yes Historical Provider, MD  furosemide (LASIX) 40 MG tablet Take 20 mg by mouth 2 (two) times daily.    Yes Historical Provider, MD  insulin glargine (LANTUS) 100 UNIT/ML injection Inject 50 Units into the skin at bedtime.   Yes Historical Provider, MD  insulin lispro protamine-lispro (HUMALOG 75/25 MIX) (75-25) 100 UNIT/ML SUSP injection Inject 10 Units into the skin 3 (three) times daily.    Yes Historical Provider, MD  levothyroxine (SYNTHROID, LEVOTHROID) 200 MCG tablet Take 200 mcg by mouth daily.     Yes Historical Provider, MD  metFORMIN (GLUCOPHAGE) 500 MG tablet Take 1,000 mg by mouth 2 (two) times daily with a meal.   Yes Historical Provider, MD  metoprolol (LOPRESSOR) 50 MG tablet Take 50 mg by mouth 2 (two) times daily.  03/13/15  Yes Historical Provider, MD  nitroGLYCERIN (NITROSTAT) 0.4 MG SL tablet TAKE 1 TABLET UNDER TONGUE AT ONSET OF CHEST PAIN; MAY REPEAT IN 5 MIN FOR UP TO 3 DOSES. 06/20/14  Yes Delos Haring, PA-C  potassium chloride (K-DUR,KLOR-CON) 10 MEQ tablet Take 1 tablet by mouth daily. 12/16/11  Yes Historical Provider, MD  simvastatin (ZOCOR) 20 MG tablet Take 1 tablet by mouth daily at 6 PM.  12/24/11  Yes Historical Provider, MD  hydrocortisone valerate cream (WESTCORT) 0.2 % Apply 1 application topically daily as needed. For skin iritation 05/05/14   Historical  Provider, MD    Family History Family History  Problem Relation Age of Onset  . Cancer Mother   . Heart attack Father     Social History Social History  Substance Use Topics  . Smoking status: Never Smoker  . Smokeless tobacco: Never Used  . Alcohol use No     Allergies   Demerol [meperidine]; Penicillins; and Meperidine hcl   Review of Systems Review of Systems  Musculoskeletal: Negative for stiffness.  Skin: Negative for itching.  Neurological: Negative for tingling and numbness.  Ten systems are reviewed and are negative for acute change except as noted in the HPI    Physical Exam Updated Vital Signs BP 140/72 (BP Location: Right Arm)   Pulse 68   Temp 97.9 F (36.6 C) (Oral)   Resp 16   Ht 5' 6"  (1.676 m)   Wt 190 lb (86.2 kg)   SpO2 97%   BMI 30.67 kg/m   Physical Exam  Constitutional: She is oriented to person, place, and time. She appears well-developed and well-nourished. No distress.  HENT:  Head: Normocephalic and atraumatic.  Nose: Nose normal.  Eyes: Conjunctivae and EOM are normal. Pupils are equal, round, and reactive to light. Right eye exhibits no discharge. Left eye exhibits no discharge. No scleral icterus.  Neck: Normal range of motion. Neck supple.  Cardiovascular: Normal rate and regular rhythm.  Exam reveals no gallop and no friction rub.   No murmur heard. Pulmonary/Chest: Effort normal and breath sounds normal. No stridor. No respiratory distress. She has no rales.  Abdominal: Soft. She exhibits no distension. There is no tenderness.  Musculoskeletal: She exhibits no edema or tenderness.       Left ankle: She exhibits swelling and ecchymosis. She exhibits no deformity, no laceration and normal pulse.  Neurological: She is alert and oriented to person, place, and time.  Skin: Skin is warm and dry. No rash noted. She is not diaphoretic. No erythema.  Psychiatric: She has a normal mood and affect.  Vitals reviewed.    ED Treatments  / Results  Labs (all labs ordered are listed, but only abnormal results are displayed) Labs Reviewed  BASIC METABOLIC PANEL - Abnormal; Notable for the following:       Result Value   Glucose, Bld 137 (*)    All other components within normal limits  CBC WITH DIFFERENTIAL/PLATELET    EKG  EKG Interpretation None       Radiology No results found.  Procedures Procedures (including critical care time)  Medications Ordered in ED Medications  enoxaparin (LOVENOX) injection 85 mg (85 mg Subcutaneous Given 06/03/16 2244)     Initial Impression / Assessment and Plan / ED Course  I have reviewed the triage vital signs and the nursing notes.  Pertinent labs & imaging results that were available during my care of the patient were reviewed by me and considered in  my medical decision making (see chart for details).     Presentation concerning for lower extremity DVT. No evidence of cellulitis. Unable to obtain ultrasound at this time a night. Screening labs obtained which showed normal platelet count and no evidence of renal insufficiency. Patient given dose of Lovenox here and instructed to follow-up at [facility] for DVT study in the morning.  Final Clinical Impressions(s) / ED Diagnoses   Final diagnoses:  Leg swelling   Disposition: Discharge  Condition: Good  I have discussed the results, Dx and Tx plan with the patient who expressed understanding and agree(s) with the plan. Discharge instructions discussed at great length. The patient was given strict return precautions who verbalized understanding of the instructions. No further questions at time of discharge.    New Prescriptions   No medications on file    Follow Up: [place] to  please present to the emergency department at [facility] and inform them that you therefore ultrasound of the leg to rule out a DVT      Fatima Blank, MD 06/03/16 2332

## 2016-06-03 NOTE — ED Triage Notes (Signed)
Pt states that 2 nights ago, she started experiencing L calf pain. She states that the pain has subsided, but now the L lower leg and ankle is stiff, warm to touch, and swelling. Denies recent travel or prolonged sitting. Denies SOB or chest pain. A&Ox4.

## 2016-06-04 ENCOUNTER — Ambulatory Visit (HOSPITAL_BASED_OUTPATIENT_CLINIC_OR_DEPARTMENT_OTHER)
Admission: RE | Admit: 2016-06-04 | Discharge: 2016-06-04 | Disposition: A | Discharge status: Home / Self Care | Payer: Medicare Other | Source: Ambulatory Visit | Attending: Emergency Medicine | Admitting: Emergency Medicine

## 2016-06-04 ENCOUNTER — Encounter (HOSPITAL_COMMUNITY): Payer: Self-pay

## 2016-06-04 ENCOUNTER — Ambulatory Visit (HOSPITAL_COMMUNITY): Admission: EM | Admit: 2016-06-04 | Payer: Medicare Other | Source: Ambulatory Visit | Admitting: Radiology

## 2016-06-04 ENCOUNTER — Emergency Department (HOSPITAL_COMMUNITY)
Admission: EM | Admit: 2016-06-04 | Discharge: 2016-06-04 | Disposition: A | Discharge status: Home / Self Care | Payer: Medicare Other | Attending: Dermatology | Admitting: Dermatology

## 2016-06-04 DIAGNOSIS — Z79899 Other long term (current) drug therapy: Secondary | ICD-10-CM | POA: Diagnosis not present

## 2016-06-04 DIAGNOSIS — Z5321 Procedure and treatment not carried out due to patient leaving prior to being seen by health care provider: Secondary | ICD-10-CM | POA: Diagnosis not present

## 2016-06-04 DIAGNOSIS — E119 Type 2 diabetes mellitus without complications: Secondary | ICD-10-CM | POA: Insufficient documentation

## 2016-06-04 DIAGNOSIS — M25572 Pain in left ankle and joints of left foot: Secondary | ICD-10-CM

## 2016-06-04 DIAGNOSIS — Z794 Long term (current) use of insulin: Secondary | ICD-10-CM | POA: Diagnosis not present

## 2016-06-04 DIAGNOSIS — Z955 Presence of coronary angioplasty implant and graft: Secondary | ICD-10-CM | POA: Insufficient documentation

## 2016-06-04 DIAGNOSIS — I10 Essential (primary) hypertension: Secondary | ICD-10-CM | POA: Insufficient documentation

## 2016-06-04 DIAGNOSIS — M79609 Pain in unspecified limb: Secondary | ICD-10-CM

## 2016-06-04 DIAGNOSIS — M7989 Other specified soft tissue disorders: Secondary | ICD-10-CM

## 2016-06-04 DIAGNOSIS — I2510 Atherosclerotic heart disease of native coronary artery without angina pectoris: Secondary | ICD-10-CM | POA: Insufficient documentation

## 2016-06-04 NOTE — ED Triage Notes (Signed)
Pt has redness and swelling in her left lower leg. Sent here after visit from [place] yesterday to have ultrasound.

## 2016-06-04 NOTE — Progress Notes (Signed)
*  PRELIMINARY RESULTS* Vascular Ultrasound Left lower extremity venous duplex has been completed.  Preliminary findings: No evidence of deep vein thrombosis or baker's cysts in the left lower extremity. Incidental finding: There is a heterogeneous fluid collection on the left anterior foot, patients area of pain, swelling and bruising.  Area is approximately 4 x 2 x 1 cm.   Tonya Hunter 06/04/2016, 10:27 AM

## 2016-06-06 DIAGNOSIS — Z9861 Coronary angioplasty status: Secondary | ICD-10-CM | POA: Diagnosis not present

## 2016-06-06 DIAGNOSIS — Z6831 Body mass index (BMI) 31.0-31.9, adult: Secondary | ICD-10-CM | POA: Diagnosis not present

## 2016-06-06 DIAGNOSIS — M25472 Effusion, left ankle: Secondary | ICD-10-CM | POA: Diagnosis not present

## 2016-06-09 ENCOUNTER — Emergency Department (HOSPITAL_COMMUNITY)
Admission: EM | Admit: 2016-06-09 | Discharge: 2016-06-09 | Disposition: A | Discharge status: Home / Self Care | Payer: Medicare Other | Attending: Emergency Medicine | Admitting: Emergency Medicine

## 2016-06-09 ENCOUNTER — Emergency Department (HOSPITAL_COMMUNITY): Payer: Medicare Other

## 2016-06-09 ENCOUNTER — Encounter (HOSPITAL_COMMUNITY): Payer: Self-pay

## 2016-06-09 DIAGNOSIS — H539 Unspecified visual disturbance: Secondary | ICD-10-CM | POA: Diagnosis not present

## 2016-06-09 DIAGNOSIS — Z79899 Other long term (current) drug therapy: Secondary | ICD-10-CM | POA: Insufficient documentation

## 2016-06-09 DIAGNOSIS — E039 Hypothyroidism, unspecified: Secondary | ICD-10-CM | POA: Insufficient documentation

## 2016-06-09 DIAGNOSIS — Z7982 Long term (current) use of aspirin: Secondary | ICD-10-CM | POA: Insufficient documentation

## 2016-06-09 DIAGNOSIS — Z955 Presence of coronary angioplasty implant and graft: Secondary | ICD-10-CM | POA: Insufficient documentation

## 2016-06-09 DIAGNOSIS — I10 Essential (primary) hypertension: Secondary | ICD-10-CM | POA: Insufficient documentation

## 2016-06-09 DIAGNOSIS — Z794 Long term (current) use of insulin: Secondary | ICD-10-CM | POA: Diagnosis not present

## 2016-06-09 DIAGNOSIS — E119 Type 2 diabetes mellitus without complications: Secondary | ICD-10-CM | POA: Diagnosis not present

## 2016-06-09 DIAGNOSIS — R200 Anesthesia of skin: Secondary | ICD-10-CM | POA: Insufficient documentation

## 2016-06-09 DIAGNOSIS — R791 Abnormal coagulation profile: Secondary | ICD-10-CM | POA: Diagnosis not present

## 2016-06-09 DIAGNOSIS — R531 Weakness: Secondary | ICD-10-CM | POA: Diagnosis not present

## 2016-06-09 LAB — ETHANOL

## 2016-06-09 LAB — CBC WITH DIFFERENTIAL/PLATELET
Basophils Absolute: 0 10*3/uL (ref 0.0–0.1)
Basophils Relative: 0 %
EOS ABS: 0.1 10*3/uL (ref 0.0–0.7)
EOS PCT: 2 %
HCT: 36.7 % (ref 36.0–46.0)
Hemoglobin: 12.2 g/dL (ref 12.0–15.0)
LYMPHS ABS: 1.5 10*3/uL (ref 0.7–4.0)
Lymphocytes Relative: 34 %
MCH: 29.9 pg (ref 26.0–34.0)
MCHC: 33.2 g/dL (ref 30.0–36.0)
MCV: 90 fL (ref 78.0–100.0)
MONOS PCT: 8 %
Monocytes Absolute: 0.4 10*3/uL (ref 0.1–1.0)
Neutro Abs: 2.4 10*3/uL (ref 1.7–7.7)
Neutrophils Relative %: 56 %
PLATELETS: 139 10*3/uL — AB (ref 150–400)
RBC: 4.08 MIL/uL (ref 3.87–5.11)
RDW: 13.7 % (ref 11.5–15.5)
WBC: 4.3 10*3/uL (ref 4.0–10.5)

## 2016-06-09 LAB — RAPID URINE DRUG SCREEN, HOSP PERFORMED
AMPHETAMINES: NOT DETECTED
BARBITURATES: NOT DETECTED
Benzodiazepines: NOT DETECTED
Cocaine: NOT DETECTED
Opiates: NOT DETECTED
TETRAHYDROCANNABINOL: NOT DETECTED

## 2016-06-09 LAB — COMPREHENSIVE METABOLIC PANEL
ALK PHOS: 131 U/L — AB (ref 38–126)
ALT: 21 U/L (ref 14–54)
AST: 32 U/L (ref 15–41)
Albumin: 3.3 g/dL — ABNORMAL LOW (ref 3.5–5.0)
Anion gap: 10 (ref 5–15)
BUN: 11 mg/dL (ref 6–20)
CALCIUM: 8.8 mg/dL — AB (ref 8.9–10.3)
CHLORIDE: 108 mmol/L (ref 101–111)
CO2: 24 mmol/L (ref 22–32)
Creatinine, Ser: 0.68 mg/dL (ref 0.44–1.00)
GFR calc Af Amer: >60 mL/min (ref >60)
GFR calc non Af Amer: >60 mL/min (ref >60)
Glucose, Bld: 106 mg/dL — ABNORMAL HIGH (ref 65–99)
Potassium: 3.9 mmol/L (ref 3.5–5.1)
Sodium: 142 mmol/L (ref 135–145)
Total Bilirubin: 0.8 mg/dL (ref 0.3–1.2)
Total Protein: 6.7 g/dL (ref 6.5–8.1)

## 2016-06-09 LAB — URINALYSIS, ROUTINE W REFLEX MICROSCOPIC
Glucose, UA: NEGATIVE mg/dL
Hgb urine dipstick: NEGATIVE
KETONES UR: NEGATIVE mg/dL
NITRITE: NEGATIVE
PH: 5.5 (ref 5.0–8.0)
Protein, ur: NEGATIVE mg/dL
Specific Gravity, Urine: >1.03 — ABNORMAL HIGH (ref 1.005–1.030)

## 2016-06-09 LAB — PROTIME-INR
INR: 1.09
Prothrombin Time: 14.1 seconds (ref 11.4–15.2)

## 2016-06-09 LAB — APTT: APTT: 39 s — AB (ref 24–36)

## 2016-06-09 LAB — URINALYSIS, MICROSCOPIC (REFLEX): RBC / HPF: NONE SEEN RBC/hpf (ref 0–5)

## 2016-06-09 LAB — TROPONIN I: Troponin I: <0.03 ng/mL (ref <0.03)

## 2016-06-09 MED ORDER — SODIUM CHLORIDE 0.9 % IV SOLN: 100.0000 mL/h | INTRAVENOUS | Status: DC

## 2016-06-09 MED ORDER — LORAZEPAM 2 MG/ML IJ SOLN
1.0000 mg | INTRAMUSCULAR | Status: DC | PRN
Start: 2016-06-09 — End: 2016-06-09
  Administered 2016-06-09: 1 mg via INTRAVENOUS
  Filled 2016-06-09: qty 1

## 2016-06-09 MED ORDER — SODIUM CHLORIDE 0.9 % IV BOLUS (SEPSIS)
500.0000 mL | Freq: Once | INTRAVENOUS | Status: AC
  Administered 2016-06-09: 500 mL via INTRAVENOUS

## 2016-06-09 NOTE — ED Notes (Signed)
Pt ambulatory to restroom

## 2016-06-09 NOTE — Discharge Instructions (Signed)
As discussed, your evaluation today has been largely reassuring.  But, it is important that you monitor your condition carefully, and do not hesitate to return to the ED if you develop new, or concerning changes in your condition. ? ?Otherwise, please follow-up with your physician for appropriate ongoing care. ? ?

## 2016-06-09 NOTE — ED Notes (Addendum)
Pt transported to xray 

## 2016-06-09 NOTE — ED Triage Notes (Signed)
Per Pt, Pt reports feeling anxious all yesterday and last night. Has Hx of blood clot noted to her left leg and reports having an [name] that was positive. Pt complains that this morning she woke up and felt some numbness on the back of her arms bilaterally and front of her legs bilaterally.

## 2016-06-09 NOTE — ED Notes (Signed)
Pt transported to MRI 

## 2016-06-09 NOTE — ED Provider Notes (Signed)
[facility] DEPT Provider Note   CSN: [id number] Arrival date & time: 06/09/16  0908     History   Chief Complaint Chief Complaint  Patient presents with  . Numbness    HPI Tonya Hunter is a 82 y.o. female.  HPI  Patient presents with concern of numbness, vision changes. She is a poor historian.  It seems as though over the past week patient has had differences from baseline. Initially the patient had left ankle swelling, ecchymosis, and was evaluated at our affiliated facility, and then with her primary care physician. The evaluation was essentially unremarkable, and she notes that symptoms have improved. However, since that time the patient smoked bilateral thigh numbness, bilateral posterior forearm numbness, intermittent blurry vision. No changes in medication, no changes in diet. She does have multiple medical issues, including diabetes, CAD, prior stent. She states that she takes medication, including Plavix, as directed.   Past Medical History:  Diagnosis Date  . Coronary artery disease 09/2005   s/p TAXUS DRUG-ELUTING STENT PLACEMENT TO THE LEFT ANTERIOR DESCENDING ARTERY  . Diabetes mellitus    TYPE II  . History of hysterectomy   . Hyperlipidemia   . Hypertension   . Osteoarthrosis, unspecified whether generalized or localized, unspecified site   . Thyroid disease    HYPOTHYROIDISM    Patient Active Problem List   Diagnosis Date Noted  . Upper gastrointestinal bleed 06/16/2014  . Upper GI bleed 06/16/2014  . NECK PAIN 10/28/2009  . OSTEOARTHRITIS 08/26/2008  . Hypothyroidism 08/26/2008  . IDDM (insulin dependent diabetes mellitus) ([place]) 01/03/2007  . Elevated lipids 01/03/2007  . Essential hypertension 01/03/2007  . Coronary atherosclerosis 01/03/2007    Past Surgical History:  Procedure Laterality Date  . APPENDECTOMY    . CATARACT EXTRACTION    . CHOLECYSTECTOMY    . CORONARY STENT PLACEMENT  09/2005   PLACEMENT TO LEFT ANTERIOR  DESCENDING ARTERY  . ESOPHAGOGASTRODUODENOSCOPY N/A 06/17/2014   Procedure: ESOPHAGOGASTRODUODENOSCOPY (EGD);  Surgeon: Missy Sabins, MD;  Location:  [facility] ENDOSCOPY;  Service: Endoscopy;  Laterality: N/A;  . HERNIA REPAIR    . KNEE SURGERY    . MEDIAN NERVE REPAIR  2009   DECOMPRESSION...RIGHT AND LEFT DECOMPRESSION    OB History    No data available       Home Medications    Prior to Admission medications   Medication Sig Start Date End Date Taking? Authorizing Provider  aspirin EC 81 MG tablet Take 81 mg by mouth daily.   Yes Historical Provider, MD  clopidogrel (PLAVIX) 75 MG tablet Take 75 mg by mouth at bedtime.   Yes Historical Provider, MD  doxycycline (VIBRAMYCIN) 100 MG capsule Take 100 mg by mouth daily.   Yes Historical Provider, MD  furosemide (LASIX) 40 MG tablet Take 20 mg by mouth 2 (two) times daily.    Yes Historical Provider, MD  insulin glargine (LANTUS) 100 UNIT/ML injection Inject 50 Units into the skin at bedtime.   Yes Historical Provider, MD  insulin lispro protamine-lispro (HUMALOG 75/25 MIX) (75-25) 100 UNIT/ML SUSP injection Inject 10 Units into the skin 3 (three) times daily.    Yes Historical Provider, MD  levothyroxine (SYNTHROID, LEVOTHROID) 200 MCG tablet Take 200 mcg by mouth daily before breakfast.    Yes Historical Provider, MD  metFORMIN (GLUCOPHAGE) 500 MG tablet Take 1,000 mg by mouth 2 (two) times daily with a meal.   Yes Historical Provider, MD  metoprolol (LOPRESSOR) 50 MG tablet Take 50 mg by mouth  2 (two) times daily.   Yes Historical Provider, MD  nitroGLYCERIN (NITROSTAT) 0.4 MG SL tablet Place 0.4 mg under the tongue every 5 (five) minutes as needed for chest pain.   Yes Historical Provider, MD  potassium chloride (K-DUR,KLOR-CON) 10 MEQ tablet Take 10 mEq by mouth daily.    Yes Historical Provider, MD  simvastatin (ZOCOR) 20 MG tablet Take 20 mg by mouth every evening.    Yes Historical Provider, MD    Family History Family History  Problem  Relation Age of Onset  . Cancer Mother   . Heart attack Father     Social History Social History  Substance Use Topics  . Smoking status: Never Smoker  . Smokeless tobacco: Never Used  . Alcohol use No     Allergies   Demerol [meperidine] and Penicillins   Review of Systems Review of Systems  Constitutional:       Per HPI, otherwise negative  HENT:       Per HPI, otherwise negative  Eyes: Positive for visual disturbance.  Respiratory:       Per HPI, otherwise negative  Cardiovascular:       Per HPI, otherwise negative  Gastrointestinal: Negative for vomiting.  Endocrine:       Negative aside from HPI  Genitourinary:       Neg aside from HPI   Musculoskeletal:       Per HPI, otherwise negative  Skin: Negative.   Neurological: Positive for numbness. Negative for syncope.     Physical Exam Updated Vital Signs BP 166/72 (BP Location: Right Arm)   Pulse 77   Temp 98 F (36.7 C) (Oral)   Resp 18   Ht 5' 6"  (1.676 m)   Wt 190 lb (86.2 kg)   SpO2 99%   BMI 30.67 kg/m   Physical Exam  Constitutional: She is oriented to person, place, and time. She appears well-developed and well-nourished. No distress.  HENT:  Head: Normocephalic and atraumatic.  Eyes: Conjunctivae and EOM are normal.  Cardiovascular: Normal rate and regular rhythm.   Pulmonary/Chest: Effort normal and breath sounds normal. No stridor. No respiratory distress.  Abdominal: She exhibits no distension.  Musculoskeletal: She exhibits no edema.  Neurological: She is alert and oriented to person, place, and time.  Patient has slow cranial nerve responses, but appropriate, symmetric. Speech is clear, brief. Visual tracking is appropriate, bilateral No appreciable sensation differences, with dull or pinpoint sensation in her thighs, forearms.  Skin: Skin is warm and dry.  Ecchymosis, left anterior calf, unremarkable otherwise  Psychiatric: She has a normal mood and affect.  Repetitive speech, but  otherwise awake and alert, oriented appropriately  Nursing note and vitals reviewed.    ED Treatments / Results  Labs (all labs ordered are listed, but only abnormal results are displayed) Labs Reviewed  APTT - Abnormal; Notable for the following:       Result Value   aPTT 39 (*)    All other components within normal limits  COMPREHENSIVE METABOLIC PANEL - Abnormal; Notable for the following:    Glucose, Bld 106 (*)    Calcium 8.8 (*)    Albumin 3.3 (*)    Alkaline Phosphatase 131 (*)    All other components within normal limits  CBC WITH DIFFERENTIAL/PLATELET - Abnormal; Notable for the following:    Platelets 139 (*)    All other components within normal limits  URINALYSIS, ROUTINE W REFLEX MICROSCOPIC - Abnormal; Notable for the following:  Specific Gravity, Urine >1.030 (*)    Bilirubin Urine MODERATE (*)    Leukocytes, UA SMALL (*)    All other components within normal limits  URINALYSIS, MICROSCOPIC (REFLEX) - Abnormal; Notable for the following:    Bacteria, UA RARE (*)    Squamous Epithelial / LPF 6-30 (*)    All other components within normal limits  ETHANOL  TROPONIN I  PROTIME-INR  RAPID URINE DRUG SCREEN, HOSP PERFORMED    EKG  EKG Interpretation  Date/Time:  Thursday June 09 2016 09:15:49 EST Ventricular Rate:  82 PR Interval:  148 QRS Duration: 144 QT Interval:  432 QTC Calculation: 504 R Axis:   112 Text Interpretation:  Sinus rhythm with occasional Premature ventricular complexes Right bundle branch block No significant change since last tracing Abnormal ekg Confirmed by Carmin Muskrat  MD [phone]) on 06/09/2016 11:13:12 AM       Radiology Dg Chest 2 View  Result Date: 06/09/2016 CLINICAL DATA:  Bilateral upper extremity numbness.  Weakness. EXAM: CHEST  2 VIEW COMPARISON:  06/20/2014 FINDINGS: Heart and mediastinal contours are within normal limits. No focal opacities or effusions. No acute bony abnormality. IMPRESSION: No active  cardiopulmonary disease. Electronically Signed   By: Rolm Baptise M.D.   On: 06/09/2016 12:01   Mr Brain Wo Contrast  Result Date: 06/09/2016 CLINICAL DATA:  Bilateral arm numbness, weakness.  Vision change EXAM: MRI HEAD WITHOUT CONTRAST TECHNIQUE: Multiplanar, multiecho pulse sequences of the brain and surrounding structures were obtained without intravenous contrast. COMPARISON:  MRI 05/17/2006 FINDINGS: Brain: Image quality degraded by mild motion. Ventricle size and cerebral volume normal for age. Negative for acute infarct. No significant chronic ischemia. Negative for hemorrhage or mass. No shift of the midline structures. Vascular: Normal arterial flow voids. Skull and upper cervical spine: Negative Sinuses/Orbits: Negative Other: None IMPRESSION: Normal for age MRI of the brain without contrast. Image quality degraded by motion. Electronically Signed   By: Franchot Gallo M.D.   On: 06/09/2016 14:08    Procedures Procedures (including critical care time)  Medications Ordered in ED Medications  sodium chloride 0.9 % bolus 500 mL (500 mLs Intravenous New Bag/Given 06/09/16 1250)    Followed by  0.9 %  sodium chloride infusion (not administered)  LORazepam (ATIVAN) injection 1 mg (1 mg Intravenous Given 06/09/16 1254)     Initial Impression / Assessment and Plan / ED Course  I have reviewed the triage vital signs and the nursing notes.  Pertinent labs & imaging results that were available during my care of the patient were reviewed by me and considered in my medical decision making (see chart for details).  2:24 PM On repeat exam the patient is awake and alert in no distress, has no ongoing complaints. She and I had a lengthy conversation about all findings, including a reassuring MRI, labs. With no ongoing complex, there is low suspicion for atypical stroke. No evidence for PE, on physical exam, vital signs, no evidence for ACS. Patient is on appropriate medical management for risk  reduction for stroke, ACS. With reassuring vitals, findings, labs, appropriate medication use, the patient was discharged in stable condition.  Final Clinical Impressions(s) / ED Diagnoses  Numbness   Carmin Muskrat, MD 06/09/16 1424

## 2016-06-14 ENCOUNTER — Encounter (HOSPITAL_COMMUNITY)
Admission: EM | Disposition: A | Discharge status: Home / Self Care | Payer: Self-pay | Source: Home / Self Care | Attending: Internal Medicine

## 2016-06-14 ENCOUNTER — Inpatient Hospital Stay (HOSPITAL_COMMUNITY)
Admission: EM | Admit: 2016-06-14 | Discharge: 2016-06-17 | DRG: 369 | Disposition: A | Discharge status: Home / Self Care | Payer: Medicare Other | Attending: Internal Medicine | Admitting: Internal Medicine

## 2016-06-14 ENCOUNTER — Emergency Department (HOSPITAL_COMMUNITY): Payer: Medicare Other

## 2016-06-14 ENCOUNTER — Encounter (HOSPITAL_COMMUNITY): Payer: Self-pay

## 2016-06-14 DIAGNOSIS — I10 Essential (primary) hypertension: Secondary | ICD-10-CM | POA: Diagnosis not present

## 2016-06-14 DIAGNOSIS — D649 Anemia, unspecified: Secondary | ICD-10-CM | POA: Diagnosis present

## 2016-06-14 DIAGNOSIS — K920 Hematemesis: Secondary | ICD-10-CM | POA: Diagnosis present

## 2016-06-14 DIAGNOSIS — C259 Malignant neoplasm of pancreas, unspecified: Secondary | ICD-10-CM | POA: Diagnosis not present

## 2016-06-14 DIAGNOSIS — K3189 Other diseases of stomach and duodenum: Secondary | ICD-10-CM | POA: Diagnosis not present

## 2016-06-14 DIAGNOSIS — E1165 Type 2 diabetes mellitus with hyperglycemia: Secondary | ICD-10-CM | POA: Diagnosis present

## 2016-06-14 DIAGNOSIS — E119 Type 2 diabetes mellitus without complications: Secondary | ICD-10-CM

## 2016-06-14 DIAGNOSIS — Z79899 Other long term (current) drug therapy: Secondary | ICD-10-CM

## 2016-06-14 DIAGNOSIS — Z792 Long term (current) use of antibiotics: Secondary | ICD-10-CM

## 2016-06-14 DIAGNOSIS — I2510 Atherosclerotic heart disease of native coronary artery without angina pectoris: Secondary | ICD-10-CM | POA: Diagnosis present

## 2016-06-14 DIAGNOSIS — I8501 Esophageal varices with bleeding: Secondary | ICD-10-CM | POA: Diagnosis not present

## 2016-06-14 DIAGNOSIS — K922 Gastrointestinal hemorrhage, unspecified: Secondary | ICD-10-CM | POA: Diagnosis present

## 2016-06-14 DIAGNOSIS — K5710 Diverticulosis of small intestine without perforation or abscess without bleeding: Secondary | ICD-10-CM | POA: Diagnosis present

## 2016-06-14 DIAGNOSIS — Z955 Presence of coronary angioplasty implant and graft: Secondary | ICD-10-CM

## 2016-06-14 DIAGNOSIS — K766 Portal hypertension: Secondary | ICD-10-CM | POA: Diagnosis present

## 2016-06-14 DIAGNOSIS — Z794 Long term (current) use of insulin: Secondary | ICD-10-CM

## 2016-06-14 DIAGNOSIS — K2980 Duodenitis without bleeding: Secondary | ICD-10-CM | POA: Diagnosis not present

## 2016-06-14 DIAGNOSIS — K7460 Unspecified cirrhosis of liver: Secondary | ICD-10-CM | POA: Diagnosis present

## 2016-06-14 DIAGNOSIS — Z7982 Long term (current) use of aspirin: Secondary | ICD-10-CM

## 2016-06-14 DIAGNOSIS — E875 Hyperkalemia: Secondary | ICD-10-CM | POA: Diagnosis present

## 2016-06-14 DIAGNOSIS — E118 Type 2 diabetes mellitus with unspecified complications: Secondary | ICD-10-CM

## 2016-06-14 DIAGNOSIS — E039 Hypothyroidism, unspecified: Secondary | ICD-10-CM | POA: Diagnosis present

## 2016-06-14 DIAGNOSIS — E785 Hyperlipidemia, unspecified: Secondary | ICD-10-CM | POA: Diagnosis present

## 2016-06-14 DIAGNOSIS — K2970 Gastritis, unspecified, without bleeding: Secondary | ICD-10-CM | POA: Diagnosis present

## 2016-06-14 DIAGNOSIS — I8500 Esophageal varices without bleeding: Secondary | ICD-10-CM

## 2016-06-14 DIAGNOSIS — Z7902 Long term (current) use of antithrombotics/antiplatelets: Secondary | ICD-10-CM

## 2016-06-14 DIAGNOSIS — R112 Nausea with vomiting, unspecified: Secondary | ICD-10-CM | POA: Diagnosis not present

## 2016-06-14 DIAGNOSIS — I4510 Unspecified right bundle-branch block: Secondary | ICD-10-CM | POA: Diagnosis present

## 2016-06-14 DIAGNOSIS — Z8249 Family history of ischemic heart disease and other diseases of the circulatory system: Secondary | ICD-10-CM

## 2016-06-14 HISTORY — DX: Type 2 diabetes mellitus without complications: E11.9

## 2016-06-14 HISTORY — DX: Hematemesis: K92.0

## 2016-06-14 HISTORY — DX: Basal cell carcinoma of skin of unspecified parts of face: C44.310

## 2016-06-14 HISTORY — PX: ESOPHAGOGASTRODUODENOSCOPY: SHX5428

## 2016-06-14 HISTORY — DX: Hypothyroidism, unspecified: E03.9

## 2016-06-14 HISTORY — DX: Acute myocardial infarction, unspecified: I21.9

## 2016-06-14 LAB — CBC WITH DIFFERENTIAL/PLATELET
BASOS PCT: 1 %
Basophils Absolute: 0.1 10*3/uL (ref 0.0–0.1)
Eosinophils Absolute: 0.2 10*3/uL (ref 0.0–0.7)
Eosinophils Relative: 2 %
HEMATOCRIT: 32.9 % — AB (ref 36.0–46.0)
HEMOGLOBIN: 10.5 g/dL — AB (ref 12.0–15.0)
LYMPHS ABS: 1.8 10*3/uL (ref 0.7–4.0)
Lymphocytes Relative: 25 %
MCH: 29.2 pg (ref 26.0–34.0)
MCHC: 31.9 g/dL (ref 30.0–36.0)
MCV: 91.4 fL (ref 78.0–100.0)
MONOS PCT: 7 %
Monocytes Absolute: 0.5 10*3/uL (ref 0.1–1.0)
NEUTROS ABS: 4.9 10*3/uL (ref 1.7–7.7)
NEUTROS PCT: 65 %
Platelets: 169 10*3/uL (ref 150–400)
RBC: 3.6 MIL/uL — ABNORMAL LOW (ref 3.87–5.11)
RDW: 13.2 % (ref 11.5–15.5)
WBC: 7.3 10*3/uL (ref 4.0–10.5)

## 2016-06-14 LAB — COMPREHENSIVE METABOLIC PANEL
ALT: 21 U/L (ref 14–54)
ANION GAP: 8 (ref 5–15)
AST: 32 U/L (ref 15–41)
Albumin: 2.9 g/dL — ABNORMAL LOW (ref 3.5–5.0)
Alkaline Phosphatase: 126 U/L (ref 38–126)
BUN: 30 mg/dL — ABNORMAL HIGH (ref 6–20)
CHLORIDE: 108 mmol/L (ref 101–111)
CO2: 22 mmol/L (ref 22–32)
Calcium: 8.8 mg/dL — ABNORMAL LOW (ref 8.9–10.3)
Creatinine, Ser: 0.96 mg/dL (ref 0.44–1.00)
GFR calc non Af Amer: 55 mL/min — ABNORMAL LOW (ref >60)
Glucose, Bld: 194 mg/dL — ABNORMAL HIGH (ref 65–99)
Potassium: 6.4 mmol/L (ref 3.5–5.1)
SODIUM: 138 mmol/L (ref 135–145)
Total Bilirubin: 0.9 mg/dL (ref 0.3–1.2)
Total Protein: 6.1 g/dL — ABNORMAL LOW (ref 6.5–8.1)

## 2016-06-14 LAB — CBG MONITORING, ED: GLUCOSE-CAPILLARY: 78 mg/dL (ref 65–99)

## 2016-06-14 LAB — PROTIME-INR
INR: 1.2
PROTHROMBIN TIME: 15.3 s — AB (ref 11.4–15.2)

## 2016-06-14 LAB — TYPE AND SCREEN
ABO/RH(D): O POS
ANTIBODY SCREEN: NEGATIVE

## 2016-06-14 LAB — GLUCOSE, CAPILLARY
Glucose-Capillary: 67 mg/dL (ref 65–99)
Glucose-Capillary: 164 mg/dL — ABNORMAL HIGH (ref 65–99)

## 2016-06-14 LAB — HEMOGLOBIN AND HEMATOCRIT, BLOOD
HCT: 31.7 % — ABNORMAL LOW (ref 36.0–46.0)
Hemoglobin: 10.2 g/dL — ABNORMAL LOW (ref 12.0–15.0)

## 2016-06-14 LAB — POC OCCULT BLOOD, ED: Fecal Occult Bld: POSITIVE — AB

## 2016-06-14 LAB — POTASSIUM: Potassium: 5.5 mmol/L — ABNORMAL HIGH (ref 3.5–5.1)

## 2016-06-14 LAB — APTT: aPTT: 37 seconds — ABNORMAL HIGH (ref 24–36)

## 2016-06-14 SURGERY — EGD (ESOPHAGOGASTRODUODENOSCOPY)
Anesthesia: Moderate Sedation

## 2016-06-14 MED ORDER — ACETAMINOPHEN 650 MG RE SUPP
650.0000 mg | Freq: Four times a day (QID) | RECTAL | Status: DC | PRN
Start: 2016-06-14 — End: 2016-06-17

## 2016-06-14 MED ORDER — INSULIN GLARGINE 100 UNIT/ML ~~LOC~~ SOLN
30.0000 [IU] | Freq: Every day | SUBCUTANEOUS | Status: DC
  Administered 2016-06-14 – 2016-06-16 (×3): 30 [IU] via SUBCUTANEOUS
  Filled 2016-06-14 (×4): qty 0.3

## 2016-06-14 MED ORDER — IOPAMIDOL (ISOVUE-300) INJECTION 61%
INTRAVENOUS | Status: AC
  Administered 2016-06-14: 30 mL
  Filled 2016-06-14: qty 30

## 2016-06-14 MED ORDER — PANTOPRAZOLE SODIUM 40 MG IV SOLR
40.0000 mg | INTRAVENOUS | Status: DC
  Administered 2016-06-14: 40 mg via INTRAVENOUS
  Filled 2016-06-14: qty 40

## 2016-06-14 MED ORDER — INSULIN ASPART 100 UNIT/ML ~~LOC~~ SOLN
0.0000 [IU] | Freq: Every day | SUBCUTANEOUS | Status: DC
  Administered 2016-06-16: 2 [IU] via SUBCUTANEOUS

## 2016-06-14 MED ORDER — PANTOPRAZOLE SODIUM 40 MG IV SOLR
40.0000 mg | Freq: Two times a day (BID) | INTRAVENOUS | Status: DC
  Administered 2016-06-14 – 2016-06-16 (×5): 40 mg via INTRAVENOUS
  Filled 2016-06-14 (×6): qty 40

## 2016-06-14 MED ORDER — SODIUM CHLORIDE 0.9% FLUSH
3.0000 mL | Freq: Two times a day (BID) | INTRAVENOUS | Status: DC
  Administered 2016-06-15 – 2016-06-17 (×4): 3 mL via INTRAVENOUS

## 2016-06-14 MED ORDER — MIDAZOLAM HCL 10 MG/2ML IJ SOLN
INTRAMUSCULAR | Status: DC | PRN
  Administered 2016-06-14: 2 mg via INTRAVENOUS
  Administered 2016-06-14: 1 mg via INTRAVENOUS

## 2016-06-14 MED ORDER — INSULIN ASPART 100 UNIT/ML ~~LOC~~ SOLN
0.0000 [IU] | Freq: Three times a day (TID) | SUBCUTANEOUS | Status: DC
  Administered 2016-06-15: 3 [IU] via SUBCUTANEOUS
  Administered 2016-06-16: 2 [IU] via SUBCUTANEOUS
  Administered 2016-06-16: 5 [IU] via SUBCUTANEOUS
  Administered 2016-06-17: 1 [IU] via SUBCUTANEOUS
  Administered 2016-06-17: 3 [IU] via SUBCUTANEOUS

## 2016-06-14 MED ORDER — ONDANSETRON HCL 4 MG PO TABS: 4.0000 mg | ORAL_TABLET | Freq: Four times a day (QID) | ORAL | Status: DC | PRN

## 2016-06-14 MED ORDER — MIDAZOLAM HCL 5 MG/ML IJ SOLN
INTRAMUSCULAR | Status: AC
  Filled 2016-06-14: qty 2

## 2016-06-14 MED ORDER — IOPAMIDOL (ISOVUE-300) INJECTION 61%
INTRAVENOUS | Status: AC
  Administered 2016-06-14
  Administered 2016-06-15: 100 mL
  Filled 2016-06-14: qty 100

## 2016-06-14 MED ORDER — BUTAMBEN-TETRACAINE-BENZOCAINE 2-2-14 % EX AERO
INHALATION_SPRAY | CUTANEOUS | Status: DC | PRN
  Administered 2016-06-14: 2 via TOPICAL

## 2016-06-14 MED ORDER — FENTANYL CITRATE (PF) 100 MCG/2ML IJ SOLN
INTRAMUSCULAR | Status: AC
Start: 2016-06-14 — End: 2016-06-14
  Filled 2016-06-14: qty 2

## 2016-06-14 MED ORDER — SODIUM CHLORIDE 0.9 % IV SOLN
INTRAVENOUS | Status: DC
  Administered 2016-06-14 – 2016-06-15 (×3): via INTRAVENOUS

## 2016-06-14 MED ORDER — ONDANSETRON HCL 4 MG/2ML IJ SOLN
4.0000 mg | Freq: Four times a day (QID) | INTRAMUSCULAR | Status: DC | PRN
  Administered 2016-06-14 – 2016-06-16 (×2): 4 mg via INTRAVENOUS
  Filled 2016-06-14: qty 2

## 2016-06-14 MED ORDER — ACETAMINOPHEN 325 MG PO TABS
650.0000 mg | ORAL_TABLET | Freq: Four times a day (QID) | ORAL | Status: DC | PRN
  Administered 2016-06-16: 650 mg via ORAL
  Filled 2016-06-14: qty 2

## 2016-06-14 NOTE — ED Notes (Signed)
MD Tomi Bamberger at the bedside

## 2016-06-14 NOTE — Consult Note (Signed)
[facility] Consult Note  Referring Provider: No ref. provider found Primary Care Physician:  [name], MD Primary Gastroenterologist:  Dr.  Laurel Dimmer Complaint: Vomiting blood HPI: Tonya Hunter is an 82 y.o. white female  presents to the emergency room after early this morning spitting up a little blood, shortly thereafter followed by frank hematemesis. She had a bowel movement slightly earlier in the day which was normal in appearance. She is on Plavix. She had a upper endoscopy for GI bleeding 2 years ago which was essentially unrevealing. She has been hemodynamically stable.  Past Medical History:  Diagnosis Date  . Coronary artery disease 09/2005   s/p TAXUS DRUG-ELUTING STENT PLACEMENT TO THE LEFT ANTERIOR DESCENDING ARTERY  . Diabetes mellitus    TYPE II  . History of hysterectomy   . Hyperlipidemia   . Hypertension   . Osteoarthrosis, unspecified whether generalized or localized, unspecified site   . Thyroid disease    HYPOTHYROIDISM    Past Surgical History:  Procedure Laterality Date  . APPENDECTOMY    . CATARACT EXTRACTION    . CHOLECYSTECTOMY    . CORONARY STENT PLACEMENT  09/2005   PLACEMENT TO LEFT ANTERIOR DESCENDING ARTERY  . ESOPHAGOGASTRODUODENOSCOPY N/A 06/17/2014   Procedure: ESOPHAGOGASTRODUODENOSCOPY (EGD);  Surgeon: Missy Sabins, MD;  Location: [facility] ENDOSCOPY;  Service: Endoscopy;  Laterality: N/A;  . HERNIA REPAIR    . KNEE SURGERY    . MEDIAN NERVE REPAIR  2009   DECOMPRESSION...RIGHT AND LEFT DECOMPRESSION     (Not in a hospital admission)  Allergies:  Allergies  Allergen Reactions  . Demerol [Meperidine] Other (See Comments)    Reaction:  Unknown   . Penicillins Other (See Comments)    Reaction:  Weakness  Has patient had a PCN reaction causing immediate rash, facial/tongue/throat swelling, SOB or lightheadedness with hypotension: No Has patient had a PCN reaction causing severe rash involving mucus membranes or skin necrosis:  No Has patient had a PCN reaction that required hospitalization Yes Has patient had a PCN reaction occurring within the last 10 years: No If all of the above answers are "NO", then may proceed with Cephalosporin use.    Family History  Problem Relation Age of Onset  . Cancer Mother   . Heart attack Father     Social History:  reports that she has never smoked. She has never used smokeless tobacco. She reports that she does not drink alcohol or use drugs.  Review of Systems: negative except as above   Blood pressure (!) 119/105, pulse 69, temperature 98.2 F (36.8 C), temperature source Oral, resp. rate 16, height 5' 6" (1.676 m), weight 86.2 kg (190 lb), SpO2 97 %. Head: Normocephalic, without obvious abnormality, atraumatic Neck: no adenopathy, no carotid bruit, no JVD, supple, symmetrical, trachea midline and thyroid not enlarged, symmetric, no tenderness/mass/nodules Resp: clear to auscultation bilaterally Cardio: regular rate and rhythm, S1, S2 normal, no murmur, click, rub or gallop GI: Abdomen soft nontender no hepatosplenomegaly mass or guarding Extremities: extremities normal, atraumatic, no cyanosis or edema  Results for orders placed or performed during the hospital encounter of 06/14/16 (from the past 48 hour(s))  Comprehensive metabolic panel     Status: Abnormal   Collection Time: 06/14/16  9:39 AM  Result Value Ref Range   Sodium 138 135 - 145 mmol/L   Potassium 6.4 (HH) 3.5 - 5.1 mmol/L    Comment: NO VISIBLE HEMOLYSIS CRITICAL RESULT CALLED TO, READ BACK BY AND VERIFIED WITH: H.STEELE,RN 06/14/16  1022 BY BSLADE    Chloride 108 101 - 111 mmol/L   CO2 22 22 - 32 mmol/L   Glucose, Bld 194 (H) 65 - 99 mg/dL   BUN 30 (H) 6 - 20 mg/dL   Creatinine, Ser 0.96 0.44 - 1.00 mg/dL   Calcium 8.8 (L) 8.9 - 10.3 mg/dL   Total Protein 6.1 (L) 6.5 - 8.1 g/dL   Albumin 2.9 (L) 3.5 - 5.0 g/dL   AST 32 15 - 41 U/L   ALT 21 14 - 54 U/L   Alkaline Phosphatase 126 38 - 126 U/L    Total Bilirubin 0.9 0.3 - 1.2 mg/dL   GFR calc non Af Amer 55 (L) >60 mL/min   GFR calc Af Amer >60 >60 mL/min    Comment: (NOTE) The eGFR has been calculated using the CKD EPI equation. This calculation has not been validated in all clinical situations. eGFR's persistently <60 mL/min signify possible Chronic Kidney Disease.    Anion gap 8 5 - 15  CBC WITH DIFFERENTIAL     Status: Abnormal   Collection Time: 06/14/16  9:39 AM  Result Value Ref Range   WBC 7.3 4.0 - 10.5 K/uL   RBC 3.60 (L) 3.87 - 5.11 MIL/uL   Hemoglobin 10.5 (L) 12.0 - 15.0 g/dL   HCT 32.9 (L) 36.0 - 46.0 %   MCV 91.4 78.0 - 100.0 fL   MCH 29.2 26.0 - 34.0 pg   MCHC 31.9 30.0 - 36.0 g/dL   RDW 13.2 11.5 - 15.5 %   Platelets 169 150 - 400 K/uL   Neutrophils Relative % 65 %   Neutro Abs 4.9 1.7 - 7.7 K/uL   Lymphocytes Relative 25 %   Lymphs Abs 1.8 0.7 - 4.0 K/uL   Monocytes Relative 7 %   Monocytes Absolute 0.5 0.1 - 1.0 K/uL   Eosinophils Relative 2 %   Eosinophils Absolute 0.2 0.0 - 0.7 K/uL   Basophils Relative 1 %   Basophils Absolute 0.1 0.0 - 0.1 K/uL  APTT     Status: Abnormal   Collection Time: 06/14/16  9:39 AM  Result Value Ref Range   aPTT 37 (H) 24 - 36 seconds    Comment:        IF BASELINE aPTT IS ELEVATED, SUGGEST PATIENT RISK ASSESSMENT BE USED TO DETERMINE APPROPRIATE ANTICOAGULANT THERAPY.   Protime-INR     Status: Abnormal   Collection Time: 06/14/16  9:39 AM  Result Value Ref Range   Prothrombin Time 15.3 (H) 11.4 - 15.2 seconds   INR 1.20   Type and screen     Status: None   Collection Time: 06/14/16  9:41 AM  Result Value Ref Range   ABO/RH(D) O POS    Antibody Screen NEG    Sample Expiration 06/17/2016   Hemoglobin and hematocrit, blood     Status: Abnormal   Collection Time: 06/14/16 12:15 PM  Result Value Ref Range   Hemoglobin 10.2 (L) 12.0 - 15.0 g/dL   HCT 31.7 (L) 36.0 - 46.0 %  Potassium     Status: Abnormal   Collection Time: 06/14/16 12:15 PM  Result  Value Ref Range   Potassium 5.5 (H) 3.5 - 5.1 mmol/L  POC occult blood, ED Provider will collect     Status: Abnormal   Collection Time: 06/14/16 12:17 PM  Result Value Ref Range   Fecal Occult Bld POSITIVE (A) NEGATIVE   Dg Chest Portable 1 View  Result Date: 06/14/2016 CLINICAL  DATA:  Onset nausea and vomiting this morning. Patient reports hematemesis. EXAM: PORTABLE CHEST 1 VIEW COMPARISON:  PA and lateral chest 06/09/2016 and 01/14/2007. CT chest 01/14/2007. FINDINGS: Right upper lobe nodule is unchanged. Lungs otherwise clear. Heart size normal. No pneumothorax or pleural effusion. IMPRESSION: No acute disease. Electronically Signed   By: Inge Rise M.D.   On: 06/14/2016 10:09    Assessment: Upper GI bleed by history, hemodynamically stable. Plan:  Will plan bedside EGD. IV Protonix. Hold Plavix. Koua Deeg C 06/14/2016, 1:24 PM  Pager [phone] If no answer or after 5 PM call [phone]

## 2016-06-14 NOTE — Progress Notes (Signed)
Addendum to consult: EGD showed no active bleeding and no strong stigmata of hemorrhage but unexpectedly moderate-sized esophageal varices involving the lower two thirds of the esophagus with portal gastropathy appearance and multiple small antral and duodenal erosions. No blood was seen whatsoever. We will obtain H. pylori antibody, start a PPI and obtain abdominal CT scan to rule out cirrhosis or pre-hepatic causes of portal hypertension. Will also hold Plavix.

## 2016-06-14 NOTE — H&P (Signed)
History and Physical    Tonya Hunter ZOX:[id number] DOB: 1934-12-13 DOA: 06/14/2016  PCP: [name], MD Patient coming from: home  Chief Complaint: hematemesis  HPI: Tonya Hunter is a very pleasant 82 y.o. female with medical history significant  CAD status post DES, hypertension, diabetes, hyperlipidemia since emergency department today with the chief complaint of hematemesis. Initial evaluation concerning for upper GI bleed acute blood loss anemia  Information is obtained from the patient and her husband who is at the bedside. Patient reports awakening today experiencing nausea vomiting moderate amount bright red blood. She states she has experienced this in the past but the blood was "very dark". Associated symptoms include generalized weakness over the last day and a half mild dizziness with position change intermittent nausea. She denies any abdominal pain melena bright red blood per rectum. She denies headache visual disturbances chest pain palpitation shortness of breath. She denies fever chills cough dysuria hematuria frequency or urgency. She does report he came to the emergency department 5 days ago complaining of swelling bruising and numbness of her left lower leg. Was evaluated by her PCP and at the emergency department that was unremarkable. Was negative for DVT. She reports swelling improved but ecchymosis spreading.    ED Course: In emergency department she's afebrile hemodynamically stable and not hypoxic. He is provided with IV fluids at the time of admission she's had no further hematemesis  Review of Systems: As per HPI otherwise 10 point review of systems negative.   Ambulatory Status: She ambulates independently no recent falls independent with ADLs  Past Medical History:  Diagnosis Date  . Coronary artery disease 09/2005   s/p TAXUS DRUG-ELUTING STENT PLACEMENT TO THE LEFT ANTERIOR DESCENDING ARTERY  . Diabetes mellitus    TYPE II  . History of  hysterectomy   . Hyperlipidemia   . Hypertension   . Osteoarthrosis, unspecified whether generalized or localized, unspecified site   . Thyroid disease    HYPOTHYROIDISM    Past Surgical History:  Procedure Laterality Date  . APPENDECTOMY    . CATARACT EXTRACTION    . CHOLECYSTECTOMY    . CORONARY STENT PLACEMENT  09/2005   PLACEMENT TO LEFT ANTERIOR DESCENDING ARTERY  . ESOPHAGOGASTRODUODENOSCOPY N/A 06/17/2014   Procedure: ESOPHAGOGASTRODUODENOSCOPY (EGD);  Surgeon: Missy Sabins, MD;  Location: [facility] ENDOSCOPY;  Service: Endoscopy;  Laterality: N/A;  . HERNIA REPAIR    . KNEE SURGERY    . MEDIAN NERVE REPAIR  2009   DECOMPRESSION...RIGHT AND LEFT DECOMPRESSION    Social History   Social History  . Marital status: Married    Spouse name: N/A  . Number of children: N/A  . Years of education: N/A   Occupational History  . 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 Smoking status: Never Smoker  . Smokeless tobacco: Never Used  . Alcohol use No  . Drug use: No  . Sexual activity: Not on file   Other Topics Concern  . Not on file   Social History Narrative  . No narrative on file    Allergies  Allergen Reactions  . Demerol [Meperidine] Other (See Comments)    Reaction:  Unknown   . Penicillins Other (See Comments)    Reaction:  Weakness  Has patient had a PCN reaction causing immediate rash, facial/tongue/throat swelling, SOB or lightheadedness with hypotension: No Has patient had a PCN reaction causing severe rash involving mucus membranes or skin necrosis: No Has patient had a PCN  reaction that required hospitalization Yes Has patient had a PCN reaction occurring within the last 10 years: No If all of the above answers are "NO", then may proceed with Cephalosporin use.    Family History  Problem Relation Age of Onset  . Cancer Mother   . Heart attack Father     Prior to Admission medications   Medication Sig Start Date End Date  Taking? Authorizing Provider  aspirin EC 81 MG tablet Take 81 mg by mouth daily.   Yes Historical Provider, MD  clopidogrel (PLAVIX) 75 MG tablet Take 75 mg by mouth at bedtime.   Yes Historical Provider, MD  doxycycline (VIBRAMYCIN) 100 MG capsule Take 100 mg by mouth daily.   Yes Historical Provider, MD  furosemide (LASIX) 40 MG tablet Take 20 mg by mouth 2 (two) times daily.    Yes Historical Provider, MD  insulin glargine (LANTUS) 100 UNIT/ML injection Inject 50 Units into the skin at bedtime.   Yes Historical Provider, MD  insulin lispro protamine-lispro (HUMALOG 75/25 MIX) (75-25) 100 UNIT/ML SUSP injection Inject 10 Units into the skin 3 (three) times daily.    Yes Historical Provider, MD  levothyroxine (SYNTHROID, LEVOTHROID) 200 MCG tablet Take 200 mcg by mouth daily before breakfast.    Yes Historical Provider, MD  metFORMIN (GLUCOPHAGE) 500 MG tablet Take 1,000 mg by mouth 2 (two) times daily with a meal.   Yes Historical Provider, MD  metoprolol (LOPRESSOR) 50 MG tablet Take 50 mg by mouth 2 (two) times daily.   Yes Historical Provider, MD  nitroGLYCERIN (NITROSTAT) 0.4 MG SL tablet Place 0.4 mg under the tongue every 5 (five) minutes as needed for chest pain.   Yes Historical Provider, MD  potassium chloride (K-DUR,KLOR-CON) 10 MEQ tablet Take 10 mEq by mouth daily.    Yes Historical Provider, MD  simvastatin (ZOCOR) 20 MG tablet Take 20 mg by mouth every evening.    Yes Historical Provider, MD    Physical Exam: Vitals:   06/14/16 1245 06/14/16 1300 06/14/16 1315 06/14/16 1330  BP: 136/56 134/71 140/63 135/72  Pulse: 77 78 82 85  Resp: 15 22 21 15   Temp:      TempSrc:      SpO2: 97% 95% 100% 95%  Weight:      Height:         General:  Appears calm and comfortable Eyes:  PERRL, EOMI, normal lids, iris ENT:  grossly normal hearing, lips & tongue, Because membranes of her mouth are moist and pink Neck:  no LAD, masses or thyromegaly Cardiovascular:  RRR, no m/r/g. No LE  edema.  Respiratory:  CTA bilaterally, no w/r/r. Normal respiratory effort. Abdomen:  soft, ntnd, is a bowel sounds throughout no guarding or rebounding Skin:  no rash or induration seen on limited exam, ecchymosis left lower extremity no swelling no heat Musculoskeletal:  grossly normal tone BUE/BLE, good ROM, no bony abnormality Psychiatric:  grossly normal mood and affect, speech fluent and appropriate, AOx3 Neurologic:  CN 2-12 grossly intact, moves all extremities in coordinated fashion, sensation intact  Labs on Admission: I have personally reviewed following labs and imaging studies  CBC:  Recent Labs Lab 06/09/16 1228 06/14/16 0939 06/14/16 1215  WBC 4.3 7.3  --   NEUTROABS 2.4 4.9  --   HGB 12.2 10.5* 10.2*  HCT 36.7 32.9* 31.7*  MCV 90.0 91.4  --   PLT 139* 169  --    Basic Metabolic Panel:  Recent Labs Lab 06/09/16  1228 06/14/16 0939 06/14/16 1215  NA 142 138  --   K 3.9 6.4* 5.5*  CL 108 108  --   CO2 24 22  --   GLUCOSE 106* 194*  --   BUN 11 30*  --   CREATININE 0.68 0.96  --   CALCIUM 8.8* 8.8*  --    GFR: Estimated Creatinine Clearance: 52.6 mL/min (by C-G formula based on SCr of 0.96 mg/dL). Liver Function Tests:  Recent Labs Lab 06/09/16 1228 06/14/16 0939  AST 32 32  ALT 21 21  ALKPHOS 131* 126  BILITOT 0.8 0.9  PROT 6.7 6.1*  ALBUMIN 3.3* 2.9*   No results for input(s): LIPASE, AMYLASE in the last 168 hours. No results for input(s): AMMONIA in the last 168 hours. Coagulation Profile:  Recent Labs Lab 06/09/16 1228 06/14/16 0939  INR 1.09 1.20   Cardiac Enzymes:  Recent Labs Lab 06/09/16 1228  TROPONINI <0.03   BNP (last 3 results) No results for input(s): PROBNP in the last 8760 hours. HbA1C: No results for input(s): HGBA1C in the last 72 hours. CBG: No results for input(s): GLUCAP in the last 168 hours. Lipid Profile: No results for input(s): CHOL, HDL, LDLCALC, TRIG, CHOLHDL, LDLDIRECT in the last 72 hours. Thyroid  Function Tests: No results for input(s): TSH, T4TOTAL, FREET4, T3FREE, THYROIDAB in the last 72 hours. Anemia Panel: No results for input(s): VITAMINB12, FOLATE, FERRITIN, TIBC, IRON, RETICCTPCT in the last 72 hours. Urine analysis:    Component Value Date/Time   COLORURINE YELLOW 06/09/2016 1146   APPEARANCEUR CLEAR 06/09/2016 1146   LABSPEC >1.030 (H) 06/09/2016 1146   PHURINE 5.5 06/09/2016 1146   GLUCOSEU NEGATIVE 06/09/2016 1146   HGBUR NEGATIVE 06/09/2016 1146   BILIRUBINUR MODERATE (A) 06/09/2016 1146   KETONESUR NEGATIVE 06/09/2016 1146   PROTEINUR NEGATIVE 06/09/2016 1146   UROBILINOGEN 1.0 06/16/2014 2138   NITRITE NEGATIVE 06/09/2016 1146   LEUKOCYTESUR SMALL (A) 06/09/2016 1146    Creatinine Clearance: Estimated Creatinine Clearance: 52.6 mL/min (by C-G formula based on SCr of 0.96 mg/dL).  Sepsis Labs: @LABRCNTIP (procalcitonin:4,lacticidven:4) )No results found for this or any previous visit (from the past 240 hour(s)).   Radiological Exams on Admission: Dg Chest Portable 1 View  Result Date: 06/14/2016 CLINICAL DATA:  Onset nausea and vomiting this morning. Patient reports hematemesis. EXAM: PORTABLE CHEST 1 VIEW COMPARISON:  PA and lateral chest 06/09/2016 and 01/14/2007. CT chest 01/14/2007. FINDINGS: Right upper lobe nodule is unchanged. Lungs otherwise clear. Heart size normal. No pneumothorax or pleural effusion. IMPRESSION: No acute disease. Electronically Signed   By: Inge Rise M.D.   On: 06/14/2016 10:09    EKG: Independently reviewed. Sinus rhythm Right bundle branch block No significant change since last tracing  Assessment/Plan Principal Problem:   Upper gastrointestinal bleed Active Problems:   IDDM (insulin dependent diabetes mellitus) (HCC)   Essential hypertension   Hypothyroidism   Hematemesis   Anemia   Hyperkalemia   CAD (coronary artery disease)   #1.Hematemesis/upper gi bleed. Patient on aspirin and Plavix. Denies any NSAID use.  history of same. EGD 2 years ago unremarkable. Hemoglobin 10.5 is hemodynamically stable and nontoxic appearing -Admit to telemetry -Serial CBC -Hold aspirin and Plavix -Nothing by mouth -Protonix IV -Away Dr. Amedeo Plenty recommendations  #2. Anemia. Likely acute blood loss. Hemoglobin 10.5. Chart review indicates 5 days ago hemoglobin was 12.2. Likely related to #1. -See #1 -Monitor oxygen saturation level -Supplemental oxygen as indicated -Protonic -Serial CBCs -Consider transfusion if any further hematemesis  and/or hemoglobin trending down  #3. CAD status post DES to LAD as well as stent thrombosis and started plavix. EKG as noted above. Patient denies any chest pain. Home meds include aspirin, metoprolol -Hold aspirin and Plavix -Resume metoprolol when indicated -Resume Zocor when appropriate -consider transfusion if Hg drifts to 7.  4. Hyperkalemia. Mild. Potassium 5.5 it point of admission. Medications include Lasix and potassium supplement. -Hold potassium for now -Hold Lasix for now -Monitor  #5. Diabetes. Home medications include Lantus 50 units at bedtime as well as 75/25 10 units 3 times a day  And oral agents -Hold oral agents for now -Provide Lantus at a slightly lower dose -Obtain a hemoglobin A1c -Nothing by mouth for now -Sliding scale insulin for optimal control  #6. Hypertension. Controlled in the emergency department. -Hold home medications for now -Monitor blood pressure closely -Resume as indicated     DVT prophylaxis: scd  Code Status: full  Family Communication: husband at bedside  Disposition Plan: home when ready  Consults called: dr Amedeo Plenty  Admission status: obs    Radene Gunning MD [facility]  If 7PM-7AM, please contact night-coverage [email or web address] Password [email or web address]  06/14/2016, 1:54 PM

## 2016-06-14 NOTE — Op Note (Signed)
[facility] Patient Name: Tonya Hunter Procedure Date : 06/14/2016 MRN: [id number] Attending MD: Missy Sabins , MD Date of Birth: 1934/12/15 CSN: [id number] Age: 82 Admit Type: Inpatient Procedure:                Upper GI endoscopy Indications:              Hematemesis Providers:                Elyse Jarvis. Amedeo Plenty, MD, Vista Lawman, RN, William Dalton, Technician Referring MD:              Medicines:                Midazolam 3 mg IV Complications:            No immediate complications. Estimated Blood Loss:     Estimated blood loss: none. Procedure:                Pre-Anesthesia Assessment:                           - Prior to the procedure, a History and Physical                            was performed, and patient medications and                            allergies were reviewed. The patient's tolerance of                            previous anesthesia was also reviewed. The risks                            and benefits of the procedure and the sedation                            options and risks were discussed with the patient.                            All questions were answered, and informed consent                            was obtained. Prior Anticoagulants: The patient has                            taken Plavix (clopidogrel), last dose was day of                            procedure. ASA Grade Assessment: III - A patient                            with severe systemic disease. After reviewing the  risks and benefits, the patient was deemed in                            satisfactory condition to undergo the procedure.                           After obtaining informed consent, the endoscope was                            passed under direct vision. Throughout the                            procedure, the patient's blood pressure, pulse, and                            oxygen saturations were monitored  continuously. The                            [id number]-[id number] ([id number]) scope was introduced through the                            mouth, and advanced to the second part of duodenum.                            The upper GI endoscopy was accomplished without                            difficulty. The patient tolerated the procedure                            well. Scope In: Scope Out: Findings:      Grade III varices were found in the middle third of the esophagus and in       the lower third of the esophagus.      Moderate portal hypertensive gastropathy was found in the gastric fundus       and in the gastric body.      Scattered moderate inflammation characterized by erosions, erythema and       granularity was found in the entire examined stomach.      Diffuse moderate inflammation characterized by erosions, erythema and       friability was found in the duodenal bulb.      The second portion of the duodenum was normal.      There is no endoscopic evidence of bleeding in the entire examined       stomach.      There is no endoscopic evidence of bleeding in the entire examined       duodenum.      A large non-bleeding diverticulum was found in the second portion of the       duodenum. Impression:               - Grade III esophageal varices.                           - Portal hypertensive gastropathy.                           -  Gastritis.                           - Duodenitis.                           - Normal second portion of the duodenum.                           - Non-bleeding duodenal diverticulum.                           - No specimens collected. Moderate Sedation:      Moderate (conscious) sedation was administered by the endoscopy nurse       and supervised by the endoscopist. The following parameters were       monitored: oxygen saturation, heart rate, blood pressure, and response       to care. Recommendation:           - Use Protonix (pantoprazole) 80 mg IV BID.                            - Perform CT scan (computed tomography) of the                            abdomen with contrast at appointment to be                            scheduled.                           - Patient has a contact number available for                            emergencies. The signs and symptoms of potential                            delayed complications were discussed with the                            patient. Return to normal activities tomorrow.                            Written discharge instructions were provided to the                            patient.                           - Clear liquid diet.                           - hold plavix                           - Continue present medications.                           -  Discontinue Plavix (clopidogrel). Procedure Code(s):        --- Professional ---                           [phone], Esophagogastroduodenoscopy, flexible,                            transoral; diagnostic, including collection of                            specimen(s) by brushing or washing, when performed                            (separate procedure) Diagnosis Code(s):        --- Professional ---                           I85.00, Esophageal varices without bleeding                           K76.6, Portal hypertension                           K31.89, Other diseases of stomach and duodenum                           K29.70, Gastritis, unspecified, without bleeding                           K29.80, Duodenitis without bleeding                           K92.0, Hematemesis                           K57.10, Diverticulosis of small intestine without                            perforation or abscess without bleeding CPT copyright 2016 [employer]. All rights reserved. The codes documented in this report are preliminary and upon coder review may  be revised to meet current compliance requirements. Missy Sabins, MD 06/14/2016 3:03:54  PM This report has been signed electronically. Number of Addenda: 0

## 2016-06-14 NOTE — ED Provider Notes (Addendum)
[facility] DEPT Provider Note   CSN: [id number] Arrival date & time: 06/14/16  0906     History   Chief Complaint Chief Complaint  Patient presents with  . Hematemesis    HPI Tonya Hunter is a 82 y.o. female.  HPI Patient presents to the emergency room with complaints of nausea, vomiting and hematemesis.  When she woke up this morning she was feeling nauseated and had general malaise. She went to the bathroom and vomited what appeared to be specks of blood. She thought it might pass a she walked out to the kitchen. She felt nauseated again and suddenly vomited a large amount of blood into the sink. She continues to have nausea. She denies any abdominal pain. She has not noticed any blood in her stool.  Patient does take aspirin daily. She also takes Plavix. She does have history of intestinal bleeding in the past but they were never able to locate the source. Previously she saw Dr. Oletta Lamas, gastroenterology. Patient denies any chest pain or shortness of breath. Past Medical History:  Diagnosis Date  . Coronary artery disease 09/2005   s/p TAXUS DRUG-ELUTING STENT PLACEMENT TO THE LEFT ANTERIOR DESCENDING ARTERY  . Diabetes mellitus    TYPE II  . History of hysterectomy   . Hyperlipidemia   . Hypertension   . Osteoarthrosis, unspecified whether generalized or localized, unspecified site   . Thyroid disease    HYPOTHYROIDISM    Patient Active Problem List   Diagnosis Date Noted  . Anemia 06/14/2016  . Hyperkalemia 06/14/2016  . Upper gastrointestinal bleed 06/16/2014  . Hematemesis 06/16/2014  . NECK PAIN 10/28/2009  . OSTEOARTHRITIS 08/26/2008  . Hypothyroidism 08/26/2008  . IDDM (insulin dependent diabetes mellitus) ([place]) 01/03/2007  . Elevated lipids 01/03/2007  . Essential hypertension 01/03/2007  . Coronary atherosclerosis 01/03/2007    Past Surgical History:  Procedure Laterality Date  . APPENDECTOMY    . CATARACT EXTRACTION    . CHOLECYSTECTOMY    .  CORONARY STENT PLACEMENT  09/2005   PLACEMENT TO LEFT ANTERIOR DESCENDING ARTERY  . ESOPHAGOGASTRODUODENOSCOPY N/A 06/17/2014   Procedure: ESOPHAGOGASTRODUODENOSCOPY (EGD);  Surgeon: Missy Sabins, MD;  Location: [facility] ENDOSCOPY;  Service: Endoscopy;  Laterality: N/A;  . HERNIA REPAIR    . KNEE SURGERY    . MEDIAN NERVE REPAIR  2009   DECOMPRESSION...RIGHT AND LEFT DECOMPRESSION    OB History    No data available       Home Medications    Prior to Admission medications   Medication Sig Start Date End Date Taking? Authorizing Provider  aspirin EC 81 MG tablet Take 81 mg by mouth daily.   Yes Historical Provider, MD  clopidogrel (PLAVIX) 75 MG tablet Take 75 mg by mouth at bedtime.   Yes Historical Provider, MD  doxycycline (VIBRAMYCIN) 100 MG capsule Take 100 mg by mouth daily.   Yes Historical Provider, MD  furosemide (LASIX) 40 MG tablet Take 20 mg by mouth 2 (two) times daily.    Yes Historical Provider, MD  insulin glargine (LANTUS) 100 UNIT/ML injection Inject 50 Units into the skin at bedtime.   Yes Historical Provider, MD  insulin lispro protamine-lispro (HUMALOG 75/25 MIX) (75-25) 100 UNIT/ML SUSP injection Inject 10 Units into the skin 3 (three) times daily.    Yes Historical Provider, MD  levothyroxine (SYNTHROID, LEVOTHROID) 200 MCG tablet Take 200 mcg by mouth daily before breakfast.    Yes Historical Provider, MD  metFORMIN (GLUCOPHAGE) 500 MG tablet Take 1,000  mg by mouth 2 (two) times daily with a meal.   Yes Historical Provider, MD  metoprolol (LOPRESSOR) 50 MG tablet Take 50 mg by mouth 2 (two) times daily.   Yes Historical Provider, MD  nitroGLYCERIN (NITROSTAT) 0.4 MG SL tablet Place 0.4 mg under the tongue every 5 (five) minutes as needed for chest pain.   Yes Historical Provider, MD  potassium chloride (K-DUR,KLOR-CON) 10 MEQ tablet Take 10 mEq by mouth daily.    Yes Historical Provider, MD  simvastatin (ZOCOR) 20 MG tablet Take 20 mg by mouth every evening.    Yes  Historical Provider, MD    Family History Family History  Problem Relation Age of Onset  . Cancer Mother   . Heart attack Father     Social History Social History  Substance Use Topics  . Smoking status: Never Smoker  . Smokeless tobacco: Never Used  . Alcohol use No     Allergies   Demerol [meperidine] and Penicillins   Review of Systems Review of Systems  Cardiovascular: Positive for palpitations.  All other systems reviewed and are negative.    Physical Exam Updated Vital Signs BP (!) 119/105   Pulse 69   Temp 98.2 F (36.8 C) (Oral)   Resp 16   Ht 5' 6"  (1.676 m)   Wt 86.2 kg   SpO2 97%   BMI 30.67 kg/m   Physical Exam  Constitutional: She appears well-developed and well-nourished. No distress.  HENT:  Head: Normocephalic and atraumatic.  Right Ear: External ear normal.  Left Ear: External ear normal.  Mouth/Throat: No oropharyngeal exudate.  No epistaxis, no blood in the oropharynx  Eyes: Conjunctivae are normal. Right eye exhibits no discharge. Left eye exhibits no discharge. No scleral icterus.  Neck: Neck supple. No tracheal deviation present.  Cardiovascular: Normal rate, regular rhythm and intact distal pulses.   Pulmonary/Chest: Effort normal and breath sounds normal. No stridor. No respiratory distress. She has no wheezes. She has no rales.  Abdominal: Soft. Bowel sounds are normal. She exhibits no distension. There is no tenderness. There is no rebound and no guarding.  Genitourinary:  Genitourinary Comments: Brown stool  Musculoskeletal: She exhibits no edema or tenderness.  Mild bruising noted of the left shin, no edema  Neurological: She is alert. She has normal strength. No cranial nerve deficit (no facial droop, extraocular movements intact, no slurred speech) or sensory deficit. She exhibits normal muscle tone. She displays no seizure activity. Coordination normal.  Skin: Skin is warm and dry. No rash noted.  Psychiatric: She has a  normal mood and affect.  Nursing note and vitals reviewed.    ED Treatments / Results  Labs (all labs ordered are listed, but only abnormal results are displayed) Labs Reviewed  COMPREHENSIVE METABOLIC PANEL - Abnormal; Notable for the following:       Result Value   Potassium 6.4 (*)    Glucose, Bld 194 (*)    BUN 30 (*)    Calcium 8.8 (*)    Total Protein 6.1 (*)    Albumin 2.9 (*)    GFR calc non Af Amer 55 (*)    All other components within normal limits  CBC WITH DIFFERENTIAL/PLATELET - Abnormal; Notable for the following:    RBC 3.60 (*)    Hemoglobin 10.5 (*)    HCT 32.9 (*)    All other components within normal limits  APTT - Abnormal; Notable for the following:    aPTT 37 (*)  All other components within normal limits  PROTIME-INR - Abnormal; Notable for the following:    Prothrombin Time 15.3 (*)    All other components within normal limits  HEMOGLOBIN AND HEMATOCRIT, BLOOD - Abnormal; Notable for the following:    Hemoglobin 10.2 (*)    HCT 31.7 (*)    All other components within normal limits  POTASSIUM - Abnormal; Notable for the following:    Potassium 5.5 (*)    All other components within normal limits  POC OCCULT BLOOD, ED - Abnormal; Notable for the following:    Fecal Occult Bld POSITIVE (*)    All other components within normal limits  HEMOGLOBIN A1C  TYPE AND SCREEN    EKG  EKG Interpretation  Date/Time:  Tuesday June 14 2016 09:34:17 EST Ventricular Rate:  68 PR Interval:    QRS Duration: 138 QT Interval:  442 QTC Calculation: 471 R Axis:   85 Text Interpretation:  Sinus rhythm Right bundle branch block No significant change since last tracing Confirmed by Tynlee Bayle  MD-J, Khalea Ventura ([id number]) on 06/14/2016 12:12:06 PM       Radiology Dg Chest Portable 1 View  Result Date: 06/14/2016 CLINICAL DATA:  Onset nausea and vomiting this morning. Patient reports hematemesis. EXAM: PORTABLE CHEST 1 VIEW COMPARISON:  PA and lateral chest 06/09/2016 and  01/14/2007. CT chest 01/14/2007. FINDINGS: Right upper lobe nodule is unchanged. Lungs otherwise clear. Heart size normal. No pneumothorax or pleural effusion. IMPRESSION: No acute disease. Electronically Signed   By: Inge Rise M.D.   On: 06/14/2016 10:09    Procedures Procedures (including critical care time)  Medications Ordered in ED Medications  0.9 %  sodium chloride infusion (125 mL/hr Intravenous Rate/Dose Verify 06/14/16 1130)  sodium chloride flush (NS) 0.9 % injection 3 mL (not administered)  acetaminophen (TYLENOL) tablet 650 mg (not administered)    Or  acetaminophen (TYLENOL) suppository 650 mg (not administered)  ondansetron (ZOFRAN) tablet 4 mg (not administered)    Or  ondansetron (ZOFRAN) injection 4 mg (not administered)  insulin aspart (novoLOG) injection 0-15 Units (not administered)  insulin aspart (novoLOG) injection 0-5 Units (not administered)  insulin glargine (LANTUS) injection 30 Units (not administered)  pantoprazole (PROTONIX) injection 40 mg (not administered)     Initial Impression / Assessment and Plan / ED Course  I have reviewed the triage vital signs and the nursing notes.  Pertinent labs & imaging results that were available during my care of the patient were reviewed by me and considered in my medical decision making (see chart for details).  Clinical Course as of Jun 15 1327  Tue Jun 14, 2016  1143 K elevated.  Cr is normal.  Suspect hemolysis although none visualized.  Will repeat  [JK]  1143 Hgb decreased to 10.  C/w gi bleeding  [JK]  1328 D/w Dr Amedeo Plenty.  Plan on endoscopy.  D/w Dyanne Carrel who will admit  [JK]  1329 Repeat K decreased  [JK]    Clinical Course User Index [JK] Dorie Rank, MD    Patient presents to the emergency room with complaints of upper GI bleeding. In the emergency room she has not had any recurrent hematemesis. Labs do show a drop in her hemoglobin in just the last couple of days. She remains hemodynamically  stable without consult with gastroenterology in the medical service for admission and further evaluation.  Will repeat her K level and recheck h/h  Final Clinical Impressions(s) / ED Diagnoses   Final diagnoses:  Gastrointestinal  hemorrhage, unspecified gastrointestinal hemorrhage type    New Prescriptions New Prescriptions   No medications on file     Dorie Rank, MD 06/14/16 [place], MD 06/14/16 1329

## 2016-06-14 NOTE — ED Notes (Signed)
Hemoccult card at the bedside

## 2016-06-14 NOTE — ED Notes (Signed)
X-Ray at the bedside 

## 2016-06-14 NOTE — ED Notes (Signed)
Endoscopy at bedside.

## 2016-06-14 NOTE — ED Triage Notes (Signed)
Per Pt, Pt is coming from home with complaints of two episodes of blood noted in her vomit. Pt reports waking up, feeling nauseous, and noting dark red and bright red blood in her vomit. EMS was called. While transporting, pt reports "chest fluttering" and nausea. EKG was noted to be normal. Pt was given 4 mg of Zofran that relieved nausea.

## 2016-06-15 ENCOUNTER — Encounter (HOSPITAL_COMMUNITY): Payer: Self-pay | Admitting: Radiology

## 2016-06-15 ENCOUNTER — Observation Stay (HOSPITAL_COMMUNITY): Payer: Medicare Other

## 2016-06-15 DIAGNOSIS — I8501 Esophageal varices with bleeding: Secondary | ICD-10-CM | POA: Diagnosis present

## 2016-06-15 DIAGNOSIS — I8500 Esophageal varices without bleeding: Secondary | ICD-10-CM | POA: Diagnosis not present

## 2016-06-15 DIAGNOSIS — E039 Hypothyroidism, unspecified: Secondary | ICD-10-CM | POA: Diagnosis present

## 2016-06-15 DIAGNOSIS — I2510 Atherosclerotic heart disease of native coronary artery without angina pectoris: Secondary | ICD-10-CM | POA: Diagnosis not present

## 2016-06-15 DIAGNOSIS — K922 Gastrointestinal hemorrhage, unspecified: Secondary | ICD-10-CM | POA: Diagnosis not present

## 2016-06-15 DIAGNOSIS — Z7982 Long term (current) use of aspirin: Secondary | ICD-10-CM | POA: Diagnosis not present

## 2016-06-15 DIAGNOSIS — Z79899 Other long term (current) drug therapy: Secondary | ICD-10-CM | POA: Diagnosis not present

## 2016-06-15 DIAGNOSIS — E890 Postprocedural hypothyroidism: Secondary | ICD-10-CM | POA: Diagnosis not present

## 2016-06-15 DIAGNOSIS — D649 Anemia, unspecified: Secondary | ICD-10-CM | POA: Diagnosis not present

## 2016-06-15 DIAGNOSIS — K7460 Unspecified cirrhosis of liver: Secondary | ICD-10-CM | POA: Diagnosis present

## 2016-06-15 DIAGNOSIS — Z794 Long term (current) use of insulin: Secondary | ICD-10-CM | POA: Diagnosis not present

## 2016-06-15 DIAGNOSIS — C259 Malignant neoplasm of pancreas, unspecified: Secondary | ICD-10-CM | POA: Diagnosis not present

## 2016-06-15 DIAGNOSIS — Z792 Long term (current) use of antibiotics: Secondary | ICD-10-CM | POA: Diagnosis not present

## 2016-06-15 DIAGNOSIS — E785 Hyperlipidemia, unspecified: Secondary | ICD-10-CM | POA: Diagnosis present

## 2016-06-15 DIAGNOSIS — Z7902 Long term (current) use of antithrombotics/antiplatelets: Secondary | ICD-10-CM | POA: Diagnosis not present

## 2016-06-15 DIAGNOSIS — K5710 Diverticulosis of small intestine without perforation or abscess without bleeding: Secondary | ICD-10-CM | POA: Diagnosis present

## 2016-06-15 DIAGNOSIS — E875 Hyperkalemia: Secondary | ICD-10-CM | POA: Diagnosis not present

## 2016-06-15 DIAGNOSIS — K2980 Duodenitis without bleeding: Secondary | ICD-10-CM | POA: Diagnosis present

## 2016-06-15 DIAGNOSIS — E119 Type 2 diabetes mellitus without complications: Secondary | ICD-10-CM | POA: Diagnosis not present

## 2016-06-15 DIAGNOSIS — K3189 Other diseases of stomach and duodenum: Secondary | ICD-10-CM | POA: Diagnosis present

## 2016-06-15 DIAGNOSIS — Z8249 Family history of ischemic heart disease and other diseases of the circulatory system: Secondary | ICD-10-CM | POA: Diagnosis not present

## 2016-06-15 DIAGNOSIS — Z955 Presence of coronary angioplasty implant and graft: Secondary | ICD-10-CM | POA: Diagnosis not present

## 2016-06-15 DIAGNOSIS — I10 Essential (primary) hypertension: Secondary | ICD-10-CM | POA: Diagnosis not present

## 2016-06-15 DIAGNOSIS — K766 Portal hypertension: Secondary | ICD-10-CM | POA: Diagnosis present

## 2016-06-15 DIAGNOSIS — E1165 Type 2 diabetes mellitus with hyperglycemia: Secondary | ICD-10-CM | POA: Diagnosis present

## 2016-06-15 DIAGNOSIS — K920 Hematemesis: Secondary | ICD-10-CM | POA: Diagnosis not present

## 2016-06-15 DIAGNOSIS — K2970 Gastritis, unspecified, without bleeding: Secondary | ICD-10-CM | POA: Diagnosis not present

## 2016-06-15 DIAGNOSIS — I4510 Unspecified right bundle-branch block: Secondary | ICD-10-CM | POA: Diagnosis present

## 2016-06-15 LAB — BASIC METABOLIC PANEL
ANION GAP: 8 (ref 5–15)
BUN: 27 mg/dL — ABNORMAL HIGH (ref 6–20)
CHLORIDE: 109 mmol/L (ref 101–111)
CO2: 22 mmol/L (ref 22–32)
Calcium: 8.6 mg/dL — ABNORMAL LOW (ref 8.9–10.3)
Creatinine, Ser: 0.77 mg/dL (ref 0.44–1.00)
GFR calc non Af Amer: >60 mL/min (ref >60)
Glucose, Bld: 77 mg/dL (ref 65–99)
POTASSIUM: 3.9 mmol/L (ref 3.5–5.1)
Sodium: 139 mmol/L (ref 135–145)

## 2016-06-15 LAB — CBC
HEMATOCRIT: 29 % — AB (ref 36.0–46.0)
HEMATOCRIT: 28.8 % — AB (ref 36.0–46.0)
HEMOGLOBIN: 9.7 g/dL — AB (ref 12.0–15.0)
HEMOGLOBIN: 9.5 g/dL — AB (ref 12.0–15.0)
MCH: 30.1 pg (ref 26.0–34.0)
MCH: 29.6 pg (ref 26.0–34.0)
MCHC: 33.4 g/dL (ref 30.0–36.0)
MCHC: 33 g/dL (ref 30.0–36.0)
MCV: 90.1 fL (ref 78.0–100.0)
MCV: 89.7 fL (ref 78.0–100.0)
Platelets: 125 10*3/uL — ABNORMAL LOW (ref 150–400)
Platelets: 137 10*3/uL — ABNORMAL LOW (ref 150–400)
RBC: 3.22 MIL/uL — AB (ref 3.87–5.11)
RBC: 3.21 MIL/uL — ABNORMAL LOW (ref 3.87–5.11)
RDW: 13.6 % (ref 11.5–15.5)
RDW: 13.4 % (ref 11.5–15.5)
WBC: 5.1 10*3/uL (ref 4.0–10.5)
WBC: 4 10*3/uL (ref 4.0–10.5)

## 2016-06-15 LAB — HEMOGLOBIN A1C
Hgb A1c MFr Bld: 10 % — ABNORMAL HIGH (ref 4.8–5.6)
Mean Plasma Glucose: 240 mg/dL

## 2016-06-15 LAB — IRON AND TIBC
Iron: 67 ug/dL (ref 28–170)
SATURATION RATIOS: 20 % (ref 10.4–31.8)
TIBC: 333 ug/dL (ref 250–450)
UIBC: 266 ug/dL

## 2016-06-15 LAB — GLUCOSE, CAPILLARY
GLUCOSE-CAPILLARY: 128 mg/dL — AB (ref 65–99)
Glucose-Capillary: 70 mg/dL (ref 65–99)
Glucose-Capillary: 112 mg/dL — ABNORMAL HIGH (ref 65–99)
Glucose-Capillary: 154 mg/dL — ABNORMAL HIGH (ref 65–99)

## 2016-06-15 LAB — FERRITIN: Ferritin: 49 ng/mL (ref 11–307)

## 2016-06-15 MED ORDER — SODIUM CHLORIDE 0.9 % IV SOLN: INTRAVENOUS | Status: DC

## 2016-06-15 NOTE — Care Management Obs Status (Signed)
[place] NOTIFICATION   Patient Details  Name: Tonya Hunter MRN: [id number] Date of Birth: December 20, 1934   Medicare Observation Status Notification Given:  Yes    Carles Collet, RN 06/15/2016, 11:10 AM

## 2016-06-15 NOTE — Progress Notes (Signed)
PROGRESS NOTE    Tonya Hunter  QAS:[id number] DOB: [birth year] DOA: 06/14/2016 PCP: [name], MD   Outpatient Specialists:     Brief Narrative:  Tonya VECCHIO is a very pleasant 82 y.o. female with medical history significant  CAD status post DES, hypertension, diabetes, hyperlipidemia since emergency department today with the chief complaint of hematemesis. Initial evaluation concerning for upper GI bleed acute blood loss anemia  Information is obtained from the patient and her husband who is at the bedside. Patient reports awakening today experiencing nausea vomiting moderate amount bright red blood. She states she has experienced this in the past but the blood was "very dark". Associated symptoms include generalized weakness over the last day and a half mild dizziness with position change intermittent nausea. She denies any abdominal pain melena bright red blood per rectum. She denies headache visual disturbances chest pain palpitation shortness of breath. She denies fever chills cough dysuria hematuria frequency or urgency. She does report he came to the emergency department 5 days ago complaining of swelling bruising and numbness of her left lower leg. Was evaluated by her PCP and at the emergency department that was unremarkable. Was negative for DVT. She reports swelling improved but ecchymosis spreading.    Assessment & Plan:   Principal Problem:   Upper gastrointestinal bleed Active Problems:   IDDM (insulin dependent diabetes mellitus) (HCC)   Essential hypertension   Hypothyroidism   Hematemesis   Anemia   Hyperkalemia   CAD (coronary artery disease)   Hematemesis/upper gi bleed due to esophageal varices -CT Scan of abd shows cirrhosis-- patient denies alcohol usage GI consulted-- appreciate assistance  Anemia. Likely acute blood loss. Hemoglobin 10.5. Chart review indicates 5 days ago hemoglobin was 12.2. Likely related to #1. -daily CBC -Consider  transfusion if any further hematemesis and/or hemoglobin trending down < 7  CAD status post DES to LAD as well as stent thrombosis and started plavix. EKG as noted above. Patient denies any chest pain. Home meds include aspirin, metoprolol -Hold aspirin and Plavix -Resume metoprolol when indicated -Resume Zocor when appropriate -consider transfusion if Hg drifts to 7.  Hyperkalemia. Mild. Potassium 5.5 it point of admission. Medications include Lasix and potassium supplement. -Hold potassium for now -Hold Lasix for now -Monitor  Diabetes- poorly controlled -Home medications include Lantus 50 units at bedtime as well as 75/25 10 units 3 times a day  And oral agents -Hold oral agents for now -Lantus at a slightly lower dose -hemoglobin A1c 10  -Sliding scale insulin for optimal control  Hypertension. -Hold home medications for now    DVT prophylaxis:  SCD's  Code Status: Full Code   Family Communication: patient  Disposition Plan:     Consultants:   GI     Subjective: No SOB, no CP-- anxious to hear test results  Objective: Vitals:   06/14/16 1735 06/14/16 2029 06/15/16 0456 06/15/16 0943  BP: (!) 143/52 (!) 110/54 (!) 124/48 (!) 144/61  Pulse: 81 88 83 94  Resp: 19 17 17 17   Temp: 98.2 F (36.8 C) 98 F (36.7 C) 98.2 F (36.8 C) 97.9 F (36.6 C)  TempSrc: Oral   Oral  SpO2: 96% 95% 91% 98%  Weight:  91.2 kg (201 lb)    Height:        Intake/Output Summary (Last 24 hours) at 06/15/16 1206 Last data filed at 06/15/16 1059  Gross per 24 hour  Intake          9432.76  ml  Output             2550 ml  Net           125.42 ml   Filed Weights   06/14/16 0921 06/14/16 1400 06/14/16 2029  Weight: 86.2 kg (190 lb) 86.2 kg (190 lb) 91.2 kg (201 lb)    Examination:  General exam: Appears calm and comfortable  Respiratory system: Clear to auscultation. Respiratory effort normal. Cardiovascular system: S1 & S2 heard, RRR. No JVD, murmurs, rubs,  gallops or clicks. No pedal edema. Gastrointestinal system: Abdomen is nondistended, soft and nontender. No organomegaly or masses felt. Normal bowel sounds heard. Central nervous system: Alert      Data Reviewed: I have personally reviewed following labs and imaging studies  CBC:  Recent Labs Lab 06/09/16 1228 06/14/16 0939 06/14/16 1215 06/15/16 0636  WBC 4.3 7.3  --  5.1  NEUTROABS 2.4 4.9  --   --   HGB 12.2 10.5* 10.2* 9.7*  HCT 36.7 32.9* 31.7* 29.0*  MCV 90.0 91.4  --  90.1  PLT 139* 169  --  151*   Basic Metabolic Panel:  Recent Labs Lab 06/09/16 1228 06/14/16 0939 06/14/16 1215 06/15/16 0636  NA 142 138  --  139  K 3.9 6.4* 5.5* 3.9  CL 108 108  --  109  CO2 24 22  --  22  GLUCOSE 106* 194*  --  77  BUN 11 30*  --  27*  CREATININE 0.68 0.96  --  0.77  CALCIUM 8.8* 8.8*  --  8.6*   GFR: Estimated Creatinine Clearance: 64.9 mL/min (by C-G formula based on SCr of 0.77 mg/dL). Liver Function Tests:  Recent Labs Lab 06/09/16 1228 06/14/16 0939  AST 32 32  ALT 21 21  ALKPHOS 131* 126  BILITOT 0.8 0.9  PROT 6.7 6.1*  ALBUMIN 3.3* 2.9*   No results for input(s): LIPASE, AMYLASE in the last 168 hours. No results for input(s): AMMONIA in the last 168 hours. Coagulation Profile:  Recent Labs Lab 06/09/16 1228 06/14/16 0939  INR 1.09 1.20   Cardiac Enzymes:  Recent Labs Lab 06/09/16 1228  TROPONINI <0.03   BNP (last 3 results) No results for input(s): PROBNP in the last 8760 hours. HbA1C:  Recent Labs  06/14/16 1215  HGBA1C 10.0*   CBG:  Recent Labs Lab 06/14/16 1611 06/14/16 1739 06/14/16 2159 06/15/16 0811 06/15/16 1137  GLUCAP 78 67 164* 70 112*   Lipid Profile: No results for input(s): CHOL, HDL, LDLCALC, TRIG, CHOLHDL, LDLDIRECT in the last 72 hours. Thyroid Function Tests: No results for input(s): TSH, T4TOTAL, FREET4, T3FREE, THYROIDAB in the last 72 hours. Anemia Panel: No results for input(s): VITAMINB12, FOLATE,  FERRITIN, TIBC, IRON, RETICCTPCT in the last 72 hours. Urine analysis:    Component Value Date/Time   COLORURINE YELLOW 06/09/2016 [place] 06/09/2016 1146   LABSPEC >1.030 (H) 06/09/2016 1146   PHURINE 5.5 06/09/2016 1146   GLUCOSEU NEGATIVE 06/09/2016 1146   HGBUR NEGATIVE 06/09/2016 1146   BILIRUBINUR MODERATE (A) 06/09/2016 1146   KETONESUR NEGATIVE 06/09/2016 1146   PROTEINUR NEGATIVE 06/09/2016 1146   UROBILINOGEN 1.0 06/16/2014 2138   NITRITE NEGATIVE 06/09/2016 1146   LEUKOCYTESUR SMALL (A) 06/09/2016 1146     )No results found for this or any previous visit (from the past 240 hour(s)).    Anti-infectives    None       Radiology Studies: Ct Abdomen Pelvis W Contrast  Result Date: 06/15/2016 CLINICAL DATA:  82 year old female inpatient admitted with hematemesis, with moderate-sized varices in the lower 2/3 of the esophagus, portal gastropathy and antral and duodenal erosions on upper endoscopy. EXAM: CT ABDOMEN AND PELVIS WITH CONTRAST TECHNIQUE: Multidetector CT imaging of the abdomen and pelvis was performed using the standard protocol following bolus administration of intravenous contrast. CONTRAST:  100 cc ISOVUE-300 IOPAMIDOL (ISOVUE-300) INJECTION 61% COMPARISON:  03/01/2013 abdominal sonogram. 10/07/2005 CT angiogram of the abdomen and pelvis. FINDINGS: Lower chest: No significant pulmonary nodules or acute consolidative airspace disease. Three-vessel coronary atherosclerosis. Small varices are noted surrounding the lower third of the thoracic esophagus. Hepatobiliary: There is mild relative hypertrophy of the lateral segment left liver lobe, and the liver surface is diffusely irregular, indicating hepatic cirrhosis. No liver mass. Cholecystectomy. No biliary ductal dilatation. Common bile duct diameter 5 mm. Pancreas: Diffuse fatty infiltration of the otherwise normal pancreas, with no pancreatic mass or duct dilation. Multiple small to moderate  periampullary duodenal diverticula. Spleen: Normal size spleen. No splenic mass. Scattered granulomatous calcifications in the spleen. Adrenals/Urinary Tract: Normal adrenals. Simple 2.4 cm lower left renal cyst. Subcentimeter hypodense renal cortical lesion in the lateral interpolar left kidney is too small to characterize and requires no further follow-up ( This recommendation follows ACR consensus guidelines: Management of the Incidental Renal Mass on CT: A White Paper of the ACR Incidental Findings Committee. J Am Coll Radiol 2017; article in press.). No hydronephrosis. Normal bladder. Stomach/Bowel: Small hiatal hernia. Mild circumferential wall thickening in the gastric antrum. Normal caliber small bowel with no small bowel wall thickening. Appendectomy. Mild sigmoid diverticulosis, with no large bowel wall thickening or pericolonic fat stranding. Vascular/Lymphatic: Atherosclerotic nonaneurysmal abdominal aorta. Patent portal, splenic, hepatic and renal veins. Small paraumbilical varices. No pathologically enlarged lymph nodes in the abdomen or pelvis. Reproductive: Status post hysterectomy, with no abnormal findings at the vaginal cuff. No adnexal mass. Other: No pneumoperitoneum, ascites or focal fluid collection. Status post ventral abdominal wall mesh hernia repair . No evidence of a recurrent ventral abdominal hernia. Musculoskeletal: No aggressive appearing focal osseous lesions. Marked thoracolumbar spondylosis. IMPRESSION: 1. Cirrhosis.  No liver mass. 2. Small paraumbilical and lower esophageal varices. 3. No ascites.  Normal size spleen. 4. Nonspecific mild circumferential wall thickening in the gastric antrum, correlate with the recent upper endoscopy findings. 5. Additional findings include aortic atherosclerosis, three-vessel coronary atherosclerosis, small hiatal hernia and mild sigmoid diverticulosis. Electronically Signed   By: Ilona Sorrel M.D.   On: 06/15/2016 07:41   Dg Chest Portable 1  View  Result Date: 06/14/2016 CLINICAL DATA:  Onset nausea and vomiting this morning. Patient reports hematemesis. EXAM: PORTABLE CHEST 1 VIEW COMPARISON:  PA and lateral chest 06/09/2016 and 01/14/2007. CT chest 01/14/2007. FINDINGS: Right upper lobe nodule is unchanged. Lungs otherwise clear. Heart size normal. No pneumothorax or pleural effusion. IMPRESSION: No acute disease. Electronically Signed   By: Inge Rise M.D.   On: 06/14/2016 10:09        Scheduled Meds: . insulin aspart  0-15 Units Subcutaneous TID WC  . insulin aspart  0-5 Units Subcutaneous QHS  . insulin glargine  30 Units Subcutaneous QHS  . pantoprazole (PROTONIX) IV  40 mg Intravenous Q12H  . sodium chloride flush  3 mL Intravenous Q12H   Continuous Infusions:   LOS: 0 days    Time spent: 25 min    [place], DO [facility] Pager [phone]  If 7PM-7AM, please contact night-coverage [email or web address] Password [facility] 06/15/2016,  12:06 PM

## 2016-06-15 NOTE — Progress Notes (Signed)
[facility] Progress Note  Subjective: The patient feels okay, did have a dark stool last night.  Objective: Vital signs in last 24 hours: Temp:  [97.9 F (36.6 C)-98.2 F (36.8 C)] 97.9 F (36.6 C) (01/31 0943) Pulse Rate:  [78-94] 94 (01/31 0943) Resp:  [15-27] 17 (01/31 0943) BP: (91-144)/(47-72) 144/61 (01/31 0943) SpO2:  [91 %-100 %] 98 % (01/31 0943) Weight:  [86.2 kg (190 lb)-91.2 kg (201 lb)] 91.2 kg (201 lb) (01/30 2029) Weight change:    PE: Unchanged  Lab Results: Results for orders placed or performed during the hospital encounter of 06/14/16 (from the past 24 hour(s))  CBG monitoring, ED     Status: None   Collection Time: 06/14/16  4:11 PM  Result Value Ref Range   Glucose-Capillary 78 65 - 99 mg/dL  Glucose, capillary     Status: None   Collection Time: 06/14/16  5:39 PM  Result Value Ref Range   Glucose-Capillary 67 65 - 99 mg/dL  Glucose, capillary     Status: Abnormal   Collection Time: 06/14/16  9:59 PM  Result Value Ref Range   Glucose-Capillary 164 (H) 65 - 99 mg/dL  Basic metabolic panel     Status: Abnormal   Collection Time: 06/15/16  6:36 AM  Result Value Ref Range   Sodium 139 135 - 145 mmol/L   Potassium 3.9 3.5 - 5.1 mmol/L   Chloride 109 101 - 111 mmol/L   CO2 22 22 - 32 mmol/L   Glucose, Bld 77 65 - 99 mg/dL   BUN 27 (H) 6 - 20 mg/dL   Creatinine, Ser 0.77 0.44 - 1.00 mg/dL   Calcium 8.6 (L) 8.9 - 10.3 mg/dL   GFR calc non Af Amer >60 >60 mL/min   GFR calc Af Amer >60 >60 mL/min   Anion gap 8 5 - 15  CBC     Status: Abnormal   Collection Time: 06/15/16  6:36 AM  Result Value Ref Range   WBC 5.1 4.0 - 10.5 K/uL   RBC 3.22 (L) 3.87 - 5.11 MIL/uL   Hemoglobin 9.7 (L) 12.0 - 15.0 g/dL   HCT 29.0 (L) 36.0 - 46.0 %   MCV 90.1 78.0 - 100.0 fL   MCH 30.1 26.0 - 34.0 pg   MCHC 33.4 30.0 - 36.0 g/dL   RDW 13.6 11.5 - 15.5 %   Platelets 125 (L) 150 - 400 K/uL  Glucose, capillary     Status: None   Collection Time: 06/15/16   8:11 AM  Result Value Ref Range   Glucose-Capillary 70 65 - 99 mg/dL  Glucose, capillary     Status: Abnormal   Collection Time: 06/15/16 11:37 AM  Result Value Ref Range   Glucose-Capillary 112 (H) 65 - 99 mg/dL    Studies/Results: Ct Abdomen Pelvis W Contrast  Result Date: 06/15/2016 CLINICAL DATA:  82 year old female inpatient admitted with hematemesis, with moderate-sized varices in the lower 2/3 of the esophagus, portal gastropathy and antral and duodenal erosions on upper endoscopy. EXAM: CT ABDOMEN AND PELVIS WITH CONTRAST TECHNIQUE: Multidetector CT imaging of the abdomen and pelvis was performed using the standard protocol following bolus administration of intravenous contrast. CONTRAST:  100 cc ISOVUE-300 IOPAMIDOL (ISOVUE-300) INJECTION 61% COMPARISON:  03/01/2013 abdominal sonogram. 10/07/2005 CT angiogram of the abdomen and pelvis. FINDINGS: Lower chest: No significant pulmonary nodules or acute consolidative airspace disease. Three-vessel coronary atherosclerosis. Small varices are noted surrounding the lower third of the thoracic esophagus. Hepatobiliary: There is mild  relative hypertrophy of the lateral segment left liver lobe, and the liver surface is diffusely irregular, indicating hepatic cirrhosis. No liver mass. Cholecystectomy. No biliary ductal dilatation. Common bile duct diameter 5 mm. Pancreas: Diffuse fatty infiltration of the otherwise normal pancreas, with no pancreatic mass or duct dilation. Multiple small to moderate periampullary duodenal diverticula. Spleen: Normal size spleen. No splenic mass. Scattered granulomatous calcifications in the spleen. Adrenals/Urinary Tract: Normal adrenals. Simple 2.4 cm lower left renal cyst. Subcentimeter hypodense renal cortical lesion in the lateral interpolar left kidney is too small to characterize and requires no further follow-up ( This recommendation follows ACR consensus guidelines: Management of the Incidental Renal Mass on CT: A  White Paper of the ACR Incidental Findings Committee. J Am Coll Radiol 2017; article in press.). No hydronephrosis. Normal bladder. Stomach/Bowel: Small hiatal hernia. Mild circumferential wall thickening in the gastric antrum. Normal caliber small bowel with no small bowel wall thickening. Appendectomy. Mild sigmoid diverticulosis, with no large bowel wall thickening or pericolonic fat stranding. Vascular/Lymphatic: Atherosclerotic nonaneurysmal abdominal aorta. Patent portal, splenic, hepatic and renal veins. Small paraumbilical varices. No pathologically enlarged lymph nodes in the abdomen or pelvis. Reproductive: Status post hysterectomy, with no abnormal findings at the vaginal cuff. No adnexal mass. Other: No pneumoperitoneum, ascites or focal fluid collection. Status post ventral abdominal wall mesh hernia repair . No evidence of a recurrent ventral abdominal hernia. Musculoskeletal: No aggressive appearing focal osseous lesions. Marked thoracolumbar spondylosis. IMPRESSION: 1. Cirrhosis.  No liver mass. 2. Small paraumbilical and lower esophageal varices. 3. No ascites.  Normal size spleen. 4. Nonspecific mild circumferential wall thickening in the gastric antrum, correlate with the recent upper endoscopy findings. 5. Additional findings include aortic atherosclerosis, three-vessel coronary atherosclerosis, small hiatal hernia and mild sigmoid diverticulosis. Electronically Signed   By: Ilona Sorrel M.D.   On: 06/15/2016 07:41   Dg Chest Portable 1 View  Result Date: 06/14/2016 CLINICAL DATA:  Onset nausea and vomiting this morning. Patient reports hematemesis. EXAM: PORTABLE CHEST 1 VIEW COMPARISON:  PA and lateral chest 06/09/2016 and 01/14/2007. CT chest 01/14/2007. FINDINGS: Right upper lobe nodule is unchanged. Lungs otherwise clear. Heart size normal. No pneumothorax or pleural effusion. IMPRESSION: No acute disease. Electronically Signed   By: Inge Rise M.D.   On: 06/14/2016 10:09       Assessment: Upper GI bleed with esophageal varices found, CT scan shows cirrhosis, LFTs all normal, no ascites.  Plan: Suspect this represents nonalcoholic fatty liver disease but will obtain hepatitis serologies autoimmune markers and iron studies. She has already bled once and is already on a beta blocker, after some discussion with decided to repeat EGD tomorrow with banding to prevent further bleeds.    Lankford Gutzmer C 06/15/2016, 12:59 PM  Pager [phone] If no answer or after 5 PM call [phone]

## 2016-06-15 NOTE — Care Management Note (Signed)
Case Management Note  Patient Details  Name: Tonya Hunter MRN: [id number] Date of Birth: 05-17-1934  Subjective/Objective:                 Patient in obs for GIB. No CM needs identified at this time.    Action/Plan:  Anticipate DC to home with spouse when medically cleared by MD. No CM needs identified at this time.  Expected Discharge Date:                  Expected Discharge Plan:  Home/Self Care  In-House Referral:     Discharge planning Services  CM Consult  Post Acute Care Choice:    Choice offered to:     DME Arranged:    DME Agency:     HH Arranged:    HH Agency:     Status of Service:  In process, will continue to follow  If discussed at Long Length of Stay Meetings, dates discussed:    Additional Comments:  Carles Collet, RN 06/15/2016, 11:10 AM

## 2016-06-16 ENCOUNTER — Inpatient Hospital Stay (HOSPITAL_COMMUNITY): Payer: Medicare Other | Admitting: Anesthesiology

## 2016-06-16 ENCOUNTER — Encounter (HOSPITAL_COMMUNITY)
Admission: EM | Disposition: A | Discharge status: Home / Self Care | Payer: Self-pay | Source: Home / Self Care | Attending: Internal Medicine

## 2016-06-16 ENCOUNTER — Encounter (HOSPITAL_COMMUNITY): Payer: Self-pay | Admitting: Anesthesiology

## 2016-06-16 DIAGNOSIS — I8500 Esophageal varices without bleeding: Secondary | ICD-10-CM

## 2016-06-16 DIAGNOSIS — I864 Gastric varices: Secondary | ICD-10-CM

## 2016-06-16 HISTORY — PX: ESOPHAGOGASTRODUODENOSCOPY (EGD) WITH PROPOFOL: SHX5813

## 2016-06-16 HISTORY — DX: Esophageal varices without bleeding: I85.00

## 2016-06-16 HISTORY — PX: ESOPHAGEAL BANDING: SHX5518

## 2016-06-16 HISTORY — DX: Gastric varices: I86.4

## 2016-06-16 LAB — CBC WITH DIFFERENTIAL/PLATELET
BASOS PCT: 0 %
Basophils Absolute: 0 10*3/uL (ref 0.0–0.1)
Eosinophils Absolute: 0.2 10*3/uL (ref 0.0–0.7)
Eosinophils Relative: 4 %
HCT: 28.6 % — ABNORMAL LOW (ref 36.0–46.0)
HEMOGLOBIN: 9.6 g/dL — AB (ref 12.0–15.0)
LYMPHS PCT: 38 %
Lymphs Abs: 1.9 10*3/uL (ref 0.7–4.0)
MCH: 30.2 pg (ref 26.0–34.0)
MCHC: 33.6 g/dL (ref 30.0–36.0)
MCV: 89.9 fL (ref 78.0–100.0)
MONO ABS: 0.5 10*3/uL (ref 0.1–1.0)
Monocytes Relative: 10 %
NEUTROS ABS: 2.4 10*3/uL (ref 1.7–7.7)
NEUTROS PCT: 48 %
Platelets: 143 10*3/uL — ABNORMAL LOW (ref 150–400)
RBC: 3.18 MIL/uL — AB (ref 3.87–5.11)
RDW: 13.8 % (ref 11.5–15.5)
WBC: 4.9 10*3/uL (ref 4.0–10.5)

## 2016-06-16 LAB — BASIC METABOLIC PANEL
ANION GAP: 8 (ref 5–15)
BUN: 15 mg/dL (ref 6–20)
CALCIUM: 9.1 mg/dL (ref 8.9–10.3)
CO2: 23 mmol/L (ref 22–32)
Chloride: 110 mmol/L (ref 101–111)
Creatinine, Ser: 0.77 mg/dL (ref 0.44–1.00)
Glucose, Bld: 127 mg/dL — ABNORMAL HIGH (ref 65–99)
POTASSIUM: 3.9 mmol/L (ref 3.5–5.1)
SODIUM: 141 mmol/L (ref 135–145)

## 2016-06-16 LAB — CBC
HCT: 29.8 % — ABNORMAL LOW (ref 36.0–46.0)
Hemoglobin: 9.9 g/dL — ABNORMAL LOW (ref 12.0–15.0)
MCH: 29.8 pg (ref 26.0–34.0)
MCHC: 33.2 g/dL (ref 30.0–36.0)
MCV: 89.8 fL (ref 78.0–100.0)
PLATELETS: 128 10*3/uL — AB (ref 150–400)
RBC: 3.32 MIL/uL — AB (ref 3.87–5.11)
RDW: 13.6 % (ref 11.5–15.5)
WBC: 4.5 10*3/uL (ref 4.0–10.5)

## 2016-06-16 LAB — GLUCOSE, CAPILLARY
GLUCOSE-CAPILLARY: 130 mg/dL — AB (ref 65–99)
GLUCOSE-CAPILLARY: 232 mg/dL — AB (ref 65–99)
Glucose-Capillary: 120 mg/dL — ABNORMAL HIGH (ref 65–99)
Glucose-Capillary: 207 mg/dL — ABNORMAL HIGH (ref 65–99)

## 2016-06-16 LAB — MITOCHONDRIAL ANTIBODIES: MITOCHONDRIAL M2 AB, IGG: 7.4 U (ref 0.0–20.0)

## 2016-06-16 LAB — HEPATITIS C ANTIBODY

## 2016-06-16 LAB — ANTI-SMOOTH MUSCLE ANTIBODY, IGG: F-ACTIN AB IGG: 11 U (ref 0–19)

## 2016-06-16 LAB — H. PYLORI ANTIBODY, IGG

## 2016-06-16 LAB — HEPATITIS B SURFACE ANTIGEN: HEP B S AG: NEGATIVE

## 2016-06-16 SURGERY — ESOPHAGOGASTRODUODENOSCOPY (EGD) WITH PROPOFOL
Anesthesia: Monitor Anesthesia Care

## 2016-06-16 MED ORDER — LIDOCAINE 2% (20 MG/ML) 5 ML SYRINGE
INTRAMUSCULAR | Status: DC | PRN
  Administered 2016-06-16: 30 mg via INTRAVENOUS

## 2016-06-16 MED ORDER — FENTANYL CITRATE (PF) 100 MCG/2ML IJ SOLN
INTRAMUSCULAR | Status: AC
  Filled 2016-06-16: qty 2

## 2016-06-16 MED ORDER — LACTATED RINGERS IV SOLN
INTRAVENOUS | Status: DC
  Administered 2016-06-16: 09:00:00 via INTRAVENOUS

## 2016-06-16 MED ORDER — PROPOFOL 500 MG/50ML IV EMUL
INTRAVENOUS | Status: DC | PRN
  Administered 2016-06-16: 50 ug/kg/min via INTRAVENOUS

## 2016-06-16 MED ORDER — LEVOTHYROXINE SODIUM 200 MCG PO TABS
200.0000 ug | ORAL_TABLET | Freq: Every day | ORAL | Status: DC
  Administered 2016-06-17: 200 ug via ORAL
  Filled 2016-06-16: qty 2
  Filled 2016-06-16: qty 1

## 2016-06-16 MED ORDER — ONDANSETRON HCL 4 MG/2ML IJ SOLN: 4.0000 mg | Freq: Once | INTRAMUSCULAR | Status: DC | PRN

## 2016-06-16 MED ORDER — PHENOL 1.4 % MT LIQD: 1.0000 | OROMUCOSAL | Status: DC | PRN

## 2016-06-16 MED ORDER — SENNOSIDES-DOCUSATE SODIUM 8.6-50 MG PO TABS
1.0000 | ORAL_TABLET | Freq: Every evening | ORAL | Status: DC | PRN
  Administered 2016-06-16: 1 via ORAL
  Filled 2016-06-16: qty 1

## 2016-06-16 MED ORDER — FENTANYL CITRATE (PF) 100 MCG/2ML IJ SOLN
50.0000 ug | Freq: Once | INTRAMUSCULAR | Status: AC
  Administered 2016-06-16: 50 ug via INTRAVENOUS

## 2016-06-16 MED ORDER — GLYCOPYRROLATE 0.2 MG/ML IV SOSY
PREFILLED_SYRINGE | INTRAVENOUS | Status: DC | PRN
  Administered 2016-06-16: .2 mg via INTRAVENOUS

## 2016-06-16 MED ORDER — METOPROLOL TARTRATE 50 MG PO TABS
50.0000 mg | ORAL_TABLET | Freq: Two times a day (BID) | ORAL | Status: DC
  Administered 2016-06-16 – 2016-06-17 (×3): 50 mg via ORAL
  Filled 2016-06-16 (×3): qty 1

## 2016-06-16 MED ORDER — POLYETHYLENE GLYCOL 3350 17 G PO PACK: 17.0000 g | PACK | Freq: Every day | ORAL | Status: DC | PRN

## 2016-06-16 NOTE — Evaluation (Signed)
Physical Therapy Evaluation Patient Details Name: Tonya Hunter MRN: [id number] DOB: 1934/10/14 Today's Date: 06/16/2016   History of Present Illness  JALA DUNDON is a very pleasant 82 y.o. female with medical history significant  CAD status post DES, hypertension, diabetes, hyperlipidemia since emergency department today with the chief complaint of hematemesis.  2/1 pt s/p upper endoscopy with varices banding.  Clinical Impression  Pt is close to baseline functioning and should be safe at home with husband's. Although pt is mildly deconditioned, there are no further acute PT needs.  Will sign off at this time.     Follow Up Recommendations No PT follow up    Equipment Recommendations  None recommended by PT    Recommendations for Other Services       Precautions / Restrictions Precautions Precautions:  (minimal fall risk)      Mobility  Bed Mobility Overal bed mobility: Modified Independent                Transfers Overall transfer level: Modified independent                  Ambulation/Gait Ambulation/Gait assistance: Supervision Ambulation Distance (Feet): 100 Feet Assistive device: None Gait Pattern/deviations: Step-through pattern Gait velocity: slower Gait velocity interpretation: at or above normal speed for age/gender General Gait Details: generally steady, but guarded  Stairs            Wheelchair Mobility    Modified Rankin (Stroke Patients Only)       Balance Overall balance assessment: No apparent balance deficits (not formally assessed)                                           Pertinent Vitals/Pain Pain Assessment: Faces Faces Pain Scale: No hurt    Home Living Family/patient expects to be discharged to:: Private residence Living Arrangements: Spouse/significant other Available Help at Discharge: Family;Available 24 hours/day Type of Home: House Home Access: Stairs to enter Entrance Stairs-Rails:  Psychiatric nurse of Steps: several Home Layout: One level Home Equipment: None      Prior Function Level of Independence: Independent         Comments: drives and runs errands     Hand Dominance   Dominant Hand: Right    Extremity/Trunk Assessment   Upper Extremity Assessment Upper Extremity Assessment: Overall WFL for tasks assessed (mild left shoulder dysfunction, but generally functional)    Lower Extremity Assessment Lower Extremity Assessment: Overall WFL for tasks assessed       Communication   Communication: No difficulties  Cognition Arousal/Alertness: Awake/alert Behavior During Therapy: WFL for tasks assessed/performed Overall Cognitive Status: Within Functional Limits for tasks assessed                      General Comments      Exercises     Assessment/Plan    PT Assessment Patent does not need any further PT services  PT Problem List            PT Treatment Interventions      PT Goals (Current goals can be found in the Care Plan section)  Acute Rehab PT Goals Patient Stated Goal: home when medically ready PT Goal Formulation: With patient    Frequency     Barriers to discharge        Co-evaluation  End of Session   Activity Tolerance: Patient tolerated treatment well Patient left: in bed;with call bell/phone within reach;with family/visitor present Nurse Communication: Mobility status         Time: 7955-8316 PT Time Calculation (min) (ACUTE ONLY): 17 min   Charges:   PT Evaluation $PT Eval Low Complexity: 1 Procedure     PT G CodesTessie Fass Catori Panozzo 06/16/2016, 5:43 PM 06/16/2016  Donnella Sham, PT [phone]  (pager)

## 2016-06-16 NOTE — Transfer of Care (Signed)
Immediate Anesthesia Transfer of Care Note  Patient: Tonya Hunter  Procedure(s) Performed: Procedure(s): ESOPHAGOGASTRODUODENOSCOPY (EGD) WITH PROPOFOL (N/A) ESOPHAGEAL BANDING (N/A)  Patient Location: PACU  Anesthesia Type:General  Level of Consciousness: awake, alert , oriented and patient cooperative  Airway & Oxygen Therapy: Patient Spontanous Breathing and Patient connected to nasal cannula oxygen  Post-op Assessment: Report given to RN and Post -op Vital signs reviewed and stable  Post vital signs: Reviewed  Last Vitals:  Vitals:   06/16/16 1016 06/16/16 1020  BP: (!) 165/69 (!) 143/90  Pulse: (!) 118 (!) 118  Resp: (!) 23 (!) 21  Temp: 36.6 C     Last Pain:  Vitals:   06/16/16 1016  TempSrc: Oral  PainSc:          Complications: No apparent anesthesia complications

## 2016-06-16 NOTE — Progress Notes (Signed)
PROGRESS NOTE    Tonya Hunter  JSE:[id number] DOB: May 21, 1934 DOA: 06/14/2016 PCP: [name], MD   Outpatient Specialists:     Brief Narrative:  Tonya Hunter was admitted after experiencing nausea vomiting moderate amount bright red blood. She states she has experienced this in the past but the blood was "very dark". Associated symptoms include generalized weakness over the last day and a half mild dizziness with position change intermittent nausea. She denies any abdominal pain melena bright red blood per rectum. She denies headache visual disturbances chest pain palpitation shortness of breath. She denies fever chills cough dysuria hematuria frequency or urgency. She does report he came to the emergency department 5 days ago complaining of swelling bruising and numbness of her left lower leg. Was evaluated by her PCP and at the emergency department that was unremarkable. Was negative for DVT. She reports swelling improved but ecchymosis spreading.    Assessment & Plan:   Principal Problem:   Upper gastrointestinal bleed Active Problems:   IDDM (insulin dependent diabetes mellitus) (HCC)   Essential hypertension   Hypothyroidism   Hematemesis   Anemia   Hyperkalemia   CAD (coronary artery disease)   Hematemesis/upper gi bleed due to esophageal varices -CT Scan of abd shows cirrhosis-- patient denies alcohol usage, probably NASH-- labs pending: mitochondrial antibody, anti smooth muscle, Hep C GI consulted-- plan for EGD and banding today  Anemia. Likely acute blood loss. Hemoglobin 10.5. Chart review indicates most recent hemoglobin was 12.2.  -daily CBC -Consider transfusion if any further hematemesis and/or hemoglobin trending down < 7  CAD status post DES to LAD as well as stent thrombosis and started plavix. EKG as noted above. Patient denies any chest pain. Home meds include aspirin, metoprolol -Hold aspirin and Plavix- stent appears to be from 2007 -Resume  metoprolol  -Resume Zocor when appropriate   Hyperkalemia.  -resolved  Diabetes- poorly controlled -Home medications include Lantus 50 units at bedtime as well as 75/25 10 units 3 times a day  And oral agents -Hold oral agents for now -Lantus at a slightly lower dose and hold 75/25 -hemoglobin A1c 10 -Sliding scale insulin for optimal control  Hypertension. -resume BB  Hypothyroidism -resume home meds   DVT prophylaxis:  SCD's  Code Status: Full Code   Family Communication: Patient/husband  Disposition Plan:  Home in AM?   Consultants:   GI     Subjective: Very anxious about procedure  Objective: Vitals:   06/15/16 2141 06/16/16 0545 06/16/16 0829 06/16/16 0856  BP: 137/71 135/65 (!) 142/77 (!) 166/78  Pulse: 87 84 98 97  Resp: 17 16 16 17   Temp: 97.6 F (36.4 C) 97.6 F (36.4 C) 98.5 F (36.9 C) 98.4 F (36.9 C)  TempSrc: Oral Oral Oral Oral  SpO2: 96% 97% 97% 95%  Weight: 89.8 kg (198 lb)   89.8 kg (198 lb)  Height:    5' 6"  (1.676 m)    Intake/Output Summary (Last 24 hours) at 06/16/16 1011 Last data filed at 06/16/16 0600  Gross per 24 hour  Intake              720 ml  Output             1900 ml  Net            -1180 ml   Filed Weights   06/14/16 2029 06/15/16 2141 06/16/16 0856  Weight: 91.2 kg (201 lb) 89.8 kg (198 lb) 89.8 kg (198 lb)  Examination:  General exam: Anxious, twirling hair Respiratory system: Clear to auscultation. Respiratory effort normal. Cardiovascular system: S1 & S2 heard, RRR. No JVD, murmurs, rubs, gallops or clicks. No pedal edema. Gastrointestinal system: Abdomen is nondistended, soft and nontender. No organomegaly or masses felt. Normal bowel sounds heard. Central nervous system: Alert      Data Reviewed: I have personally reviewed following labs and imaging studies  CBC:  Recent Labs Lab 06/09/16 1228 06/14/16 0939 06/14/16 1215 06/15/16 0636 06/15/16 2133  WBC 4.3 7.3  --  5.1 4.0    NEUTROABS 2.4 4.9  --   --   --   HGB 12.2 10.5* 10.2* 9.7* 9.5*  HCT 36.7 32.9* 31.7* 29.0* 28.8*  MCV 90.0 91.4  --  90.1 89.7  PLT 139* 169  --  125* 993*   Basic Metabolic Panel:  Recent Labs Lab 06/09/16 1228 06/14/16 0939 06/14/16 1215 06/15/16 0636  NA 142 138  --  139  K 3.9 6.4* 5.5* 3.9  CL 108 108  --  109  CO2 24 22  --  22  GLUCOSE 106* 194*  --  77  BUN 11 30*  --  27*  CREATININE 0.68 0.96  --  0.77  CALCIUM 8.8* 8.8*  --  8.6*   GFR: Estimated Creatinine Clearance: 64.4 mL/min (by C-G formula based on SCr of 0.77 mg/dL). Liver Function Tests:  Recent Labs Lab 06/09/16 1228 06/14/16 0939  AST 32 32  ALT 21 21  ALKPHOS 131* 126  BILITOT 0.8 0.9  PROT 6.7 6.1*  ALBUMIN 3.3* 2.9*   No results for input(s): LIPASE, AMYLASE in the last 168 hours. No results for input(s): AMMONIA in the last 168 hours. Coagulation Profile:  Recent Labs Lab 06/09/16 1228 06/14/16 0939  INR 1.09 1.20   Cardiac Enzymes:  Recent Labs Lab 06/09/16 1228  TROPONINI <0.03   BNP (last 3 results) No results for input(s): PROBNP in the last 8760 hours. HbA1C:  Recent Labs  06/14/16 1215  HGBA1C 10.0*   CBG:  Recent Labs Lab 06/15/16 0811 06/15/16 1137 06/15/16 1631 06/15/16 2125 06/16/16 0811  GLUCAP 70 112* 154* 128* 130*   Lipid Profile: No results for input(s): CHOL, HDL, LDLCALC, TRIG, CHOLHDL, LDLDIRECT in the last 72 hours. Thyroid Function Tests: No results for input(s): TSH, T4TOTAL, FREET4, T3FREE, THYROIDAB in the last 72 hours. Anemia Panel:  Recent Labs  06/15/16 1516  FERRITIN 49  TIBC 333  IRON 67   Urine analysis:    Component Value Date/Time   COLORURINE YELLOW 06/09/2016 [place] 06/09/2016 1146   LABSPEC >1.030 (H) 06/09/2016 1146   PHURINE 5.5 06/09/2016 1146   GLUCOSEU NEGATIVE 06/09/2016 1146   HGBUR NEGATIVE 06/09/2016 1146   BILIRUBINUR MODERATE (A) 06/09/2016 1146   KETONESUR NEGATIVE 06/09/2016  1146   PROTEINUR NEGATIVE 06/09/2016 1146   UROBILINOGEN 1.0 06/16/2014 2138   NITRITE NEGATIVE 06/09/2016 1146   LEUKOCYTESUR SMALL (A) 06/09/2016 1146     )No results found for this or any previous visit (from the past 240 hour(s)).    Anti-infectives    None       Radiology Studies: Ct Abdomen Pelvis W Contrast  Result Date: 06/15/2016 CLINICAL DATA:  82 year old female inpatient admitted with hematemesis, with moderate-sized varices in the lower 2/3 of the esophagus, portal gastropathy and antral and duodenal erosions on upper endoscopy. EXAM: CT ABDOMEN AND PELVIS WITH CONTRAST TECHNIQUE: Multidetector CT imaging of the abdomen and pelvis was  performed using the standard protocol following bolus administration of intravenous contrast. CONTRAST:  100 cc ISOVUE-300 IOPAMIDOL (ISOVUE-300) INJECTION 61% COMPARISON:  03/01/2013 abdominal sonogram. 10/07/2005 CT angiogram of the abdomen and pelvis. FINDINGS: Lower chest: No significant pulmonary nodules or acute consolidative airspace disease. Three-vessel coronary atherosclerosis. Small varices are noted surrounding the lower third of the thoracic esophagus. Hepatobiliary: There is mild relative hypertrophy of the lateral segment left liver lobe, and the liver surface is diffusely irregular, indicating hepatic cirrhosis. No liver mass. Cholecystectomy. No biliary ductal dilatation. Common bile duct diameter 5 mm. Pancreas: Diffuse fatty infiltration of the otherwise normal pancreas, with no pancreatic mass or duct dilation. Multiple small to moderate periampullary duodenal diverticula. Spleen: Normal size spleen. No splenic mass. Scattered granulomatous calcifications in the spleen. Adrenals/Urinary Tract: Normal adrenals. Simple 2.4 cm lower left renal cyst. Subcentimeter hypodense renal cortical lesion in the lateral interpolar left kidney is too small to characterize and requires no further follow-up ( This recommendation follows ACR consensus  guidelines: Management of the Incidental Renal Mass on CT: A White Paper of the ACR Incidental Findings Committee. J Am Coll Radiol 2017; article in press.). No hydronephrosis. Normal bladder. Stomach/Bowel: Small hiatal hernia. Mild circumferential wall thickening in the gastric antrum. Normal caliber small bowel with no small bowel wall thickening. Appendectomy. Mild sigmoid diverticulosis, with no large bowel wall thickening or pericolonic fat stranding. Vascular/Lymphatic: Atherosclerotic nonaneurysmal abdominal aorta. Patent portal, splenic, hepatic and renal veins. Small paraumbilical varices. No pathologically enlarged lymph nodes in the abdomen or pelvis. Reproductive: Status post hysterectomy, with no abnormal findings at the vaginal cuff. No adnexal mass. Other: No pneumoperitoneum, ascites or focal fluid collection. Status post ventral abdominal wall mesh hernia repair . No evidence of a recurrent ventral abdominal hernia. Musculoskeletal: No aggressive appearing focal osseous lesions. Marked thoracolumbar spondylosis. IMPRESSION: 1. Cirrhosis.  No liver mass. 2. Small paraumbilical and lower esophageal varices. 3. No ascites.  Normal size spleen. 4. Nonspecific mild circumferential wall thickening in the gastric antrum, correlate with the recent upper endoscopy findings. 5. Additional findings include aortic atherosclerosis, three-vessel coronary atherosclerosis, small hiatal hernia and mild sigmoid diverticulosis. Electronically Signed   By: Ilona Sorrel M.D.   On: 06/15/2016 07:41        Scheduled Meds: . [MAR Hold] insulin aspart  0-15 Units Subcutaneous TID WC  . [MAR Hold] insulin aspart  0-5 Units Subcutaneous QHS  . [MAR Hold] insulin glargine  30 Units Subcutaneous QHS  . [MAR Hold] pantoprazole (PROTONIX) IV  40 mg Intravenous Q12H  . [MAR Hold] sodium chloride flush  3 mL Intravenous Q12H   Continuous Infusions: . sodium chloride Stopped (06/15/16 2128)  . lactated ringers 20  mL/hr at 06/16/16 0901     LOS: 1 day    Time spent: 25 min    [place], DO [facility] Pager [phone]  If 7PM-7AM, please contact night-coverage [email or web address] Password [email or web address] 06/16/2016, 10:11 AM

## 2016-06-16 NOTE — Progress Notes (Signed)
Patient has been encouraged twice to ambulate and patient continues to refuse

## 2016-06-16 NOTE — Anesthesia Preprocedure Evaluation (Addendum)
Anesthesia Evaluation  Patient identified by MRN, date of birth, ID band Patient awake    Reviewed: Allergy & Precautions, NPO status , Patient's Chart, lab work & pertinent test results  Airway Mallampati: II  TM Distance: >3 FB Neck ROM: Full    Dental no notable dental hx. (+) Teeth Intact, Caps   Pulmonary neg pulmonary ROS,    Pulmonary exam normal breath sounds clear to auscultation       Cardiovascular hypertension, Pt. on medications + CAD, + Past MI and + Cardiac Stents  Normal cardiovascular exam Rhythm:Regular Rate:Normal     Neuro/Psych negative neurological ROS  negative psych ROS   GI/Hepatic (+) Cirrhosis   Esophageal Varices    , Esophageal varices Hematemesis   Endo/Other  diabetes, Poorly Controlled, Type 2, Insulin Dependent, Oral Hypoglycemic AgentsHypothyroidism Obesity Hyperlipidemia  Renal/GU negative Renal ROS  negative genitourinary   Musculoskeletal  (+) Arthritis , Osteoarthritis,    Abdominal (+) + obese,   Peds  Hematology  (+) anemia , Thrombocytopenia    Anesthesia Other Findings   Reproductive/Obstetrics                              Chemistry      Component Value Date/Time   NA 139 06/15/2016 0636   K 3.9 06/15/2016 0636   CL 109 06/15/2016 0636   CO2 22 06/15/2016 0636   BUN 27 (H) 06/15/2016 0636   CREATININE 0.77 06/15/2016 0636      Component Value Date/Time   CALCIUM 8.6 (L) 06/15/2016 0636   ALKPHOS 126 06/14/2016 0939   AST 32 06/14/2016 0939   ALT 21 06/14/2016 0939   BILITOT 0.9 06/14/2016 0939     Lab Results  Component Value Date   WBC 4.0 06/15/2016   HGB 9.5 (L) 06/15/2016   HCT 28.8 (L) 06/15/2016   MCV 89.7 06/15/2016   PLT 137 (L) 06/15/2016   EKG: normal sinus rhythm, RBBB.  Anesthesia Physical Anesthesia Plan  ASA: III  Anesthesia Plan: MAC   Post-op Pain Management:    Induction: Intravenous  Airway  Management Planned: Natural Airway and Nasal Cannula  Additional Equipment:   Intra-op Plan:   Post-operative Plan:   Informed Consent: I have reviewed the patients History and Physical, chart, labs and discussed the procedure including the risks, benefits and alternatives for the proposed anesthesia with the patient or authorized representative who has indicated his/her understanding and acceptance.     Plan Discussed with: CRNA and Anesthesiologist  Anesthesia Plan Comments:         Anesthesia Quick Evaluation

## 2016-06-16 NOTE — Progress Notes (Signed)
EGD with variceal banding successful. Hepatitis B surface antigen is negative, remainder of serologic workup in progress. Will follow. Probably resume Plavix 2 days from now.

## 2016-06-16 NOTE — Op Note (Signed)
[facility] Patient Name: Tonya Hunter Procedure Date : 06/16/2016 MRN: [id number] Attending MD: Missy Sabins , MD Date of Birth: October 31, 1934 CSN: [id number] Age: 82 Admit Type: Inpatient Procedure:                Upper GI endoscopy Indications:              1st degree variceal eradication (no prior bleeding) Providers:                Elyse Jarvis. Amedeo Plenty, MD, Cleda Daub, RN, William Dalton, Technician Referring MD:              Medicines:                Propofol per Anesthesia Complications:            No immediate complications. Estimated Blood Loss:     Estimated blood loss was minimal. Procedure:                Pre-Anesthesia Assessment:                           - Prior to the procedure, a History and Physical                            was performed, and patient medications and                            allergies were reviewed. The patient's tolerance of                            previous anesthesia was also reviewed. The risks                            and benefits of the procedure and the sedation                            options and risks were discussed with the patient.                            All questions were answered, and informed consent                            was obtained. Prior Anticoagulants: The patient has                            taken Plavix (clopidogrel), last dose was 3 days                            prior to procedure. ASA Grade Assessment: III - A                            patient with severe systemic disease. After  reviewing the risks and benefits, the patient was                            deemed in satisfactory condition to undergo the                            procedure.                           After obtaining informed consent, the endoscope was                            passed under direct vision. Throughout the                            procedure, the patient's blood  pressure, pulse, and                            oxygen saturations were monitored continuously. The                            [id number] ([id number]) scope was introduced through the                            mouth, and advanced to the second part of duodenum.                            The upper GI endoscopy was accomplished without                            difficulty. The patient tolerated the procedure                            well. Findings:      Grade III varices were found in the middle third of the esophagus and in       the lower third of the esophagus. Six bands were successfully placed       with incomplete eradication of varices. There was no bleeding during the       procedure.      Moderate portal hypertensive gastropathy was found in the cardia, in the       gastric fundus and in the gastric body.      The examined duodenum was normal. Impression:               - Grade III esophageal varices. Incompletely                            eradicated. Banded.                           - Portal hypertensive gastropathy.                           - Normal examined duodenum.                           -  No specimens collected. Moderate Sedation:      no moderate sedation Recommendation:           - Patient has a contact number available for                            emergencies. The signs and symptoms of potential                            delayed complications were discussed with the                            patient. Return to normal activities tomorrow.                            Written discharge instructions were provided to the                            patient.                           - Resume previous diet.                           - Continue present medications.                           - Resume Plavix (clopidogrel) at prior dose in 2                            days. Procedure Code(s):        --- Professional ---                           [phone],  Esophagogastroduodenoscopy, flexible,                            transoral; with band ligation of esophageal/gastric                            varices Diagnosis Code(s):        --- Professional ---                           I85.00, Esophageal varices without bleeding                           K76.6, Portal hypertension                           K31.89, Other diseases of stomach and duodenum CPT copyright 2016 [employer]. All rights reserved. The codes documented in this report are preliminary and upon coder review may  be revised to meet current compliance requirements. Missy Sabins, MD 06/16/2016 10:15:33 AM This report has been signed electronically. Number of Addenda: 0

## 2016-06-16 NOTE — Anesthesia Postprocedure Evaluation (Signed)
Anesthesia Post Note  Patient: Tonya Hunter  Procedure(s) Performed: Procedure(s) (LRB): ESOPHAGOGASTRODUODENOSCOPY (EGD) WITH PROPOFOL (N/A) ESOPHAGEAL BANDING (N/A)  Patient location during evaluation: PACU Anesthesia Type: MAC Level of consciousness: awake and alert and oriented Pain management: pain level controlled Vital Signs Assessment: post-procedure vital signs reviewed and stable Respiratory status: spontaneous breathing, nonlabored ventilation and respiratory function stable Cardiovascular status: stable and blood pressure returned to baseline Postop Assessment: no signs of nausea or vomiting Anesthetic complications: no       Last Vitals:  Vitals:   06/16/16 1016 06/16/16 1020  BP: (!) 165/69 (!) 143/90  Pulse: (!) 118 (!) 118  Resp: (!) 23 (!) 21  Temp: 36.6 C     Last Pain:  Vitals:   06/16/16 1016  TempSrc: Oral  PainSc:                  Jory Welke A.

## 2016-06-17 DIAGNOSIS — E875 Hyperkalemia: Secondary | ICD-10-CM

## 2016-06-17 DIAGNOSIS — K922 Gastrointestinal hemorrhage, unspecified: Secondary | ICD-10-CM

## 2016-06-17 DIAGNOSIS — I2510 Atherosclerotic heart disease of native coronary artery without angina pectoris: Secondary | ICD-10-CM

## 2016-06-17 DIAGNOSIS — E890 Postprocedural hypothyroidism: Secondary | ICD-10-CM

## 2016-06-17 DIAGNOSIS — D649 Anemia, unspecified: Secondary | ICD-10-CM

## 2016-06-17 LAB — BASIC METABOLIC PANEL
Anion gap: 10 (ref 5–15)
BUN: 13 mg/dL (ref 6–20)
CHLORIDE: 108 mmol/L (ref 101–111)
CO2: 23 mmol/L (ref 22–32)
CREATININE: 0.85 mg/dL (ref 0.44–1.00)
Calcium: 9.5 mg/dL (ref 8.9–10.3)
GFR calc Af Amer: >60 mL/min (ref >60)
GFR calc non Af Amer: >60 mL/min (ref >60)
Glucose, Bld: 155 mg/dL — ABNORMAL HIGH (ref 65–99)
POTASSIUM: 4 mmol/L (ref 3.5–5.1)
SODIUM: 141 mmol/L (ref 135–145)

## 2016-06-17 LAB — GLUCOSE, CAPILLARY
GLUCOSE-CAPILLARY: 198 mg/dL — AB (ref 65–99)
Glucose-Capillary: 138 mg/dL — ABNORMAL HIGH (ref 65–99)

## 2016-06-17 MED ORDER — PANTOPRAZOLE SODIUM 40 MG PO TBEC: 40.0000 mg | DELAYED_RELEASE_TABLET | Freq: Every day | ORAL | 0 refills | Status: DC

## 2016-06-17 MED ORDER — CLOPIDOGREL BISULFATE 75 MG PO TABS: 75.0000 mg | ORAL_TABLET | Freq: Every day | ORAL | 0 refills | Status: DC

## 2016-06-17 MED ORDER — PANTOPRAZOLE SODIUM 40 MG PO TBEC: 40.0000 mg | DELAYED_RELEASE_TABLET | Freq: Two times a day (BID) | ORAL | 0 refills | Status: DC

## 2016-06-17 MED ORDER — PANTOPRAZOLE SODIUM 40 MG PO TBEC
40.0000 mg | DELAYED_RELEASE_TABLET | Freq: Two times a day (BID) | ORAL | Status: DC
  Administered 2016-06-17: 40 mg via ORAL
  Filled 2016-06-17: qty 1

## 2016-06-17 NOTE — Discharge Summary (Addendum)
Physician Discharge Summary  Tonya Hunter:[id number] DOB: 1935-01-14 DOA: 06/14/2016  PCP: [name], MD  Admit date: 06/14/2016 Discharge date: 06/17/2016  Admitted From: Home  Disposition:  Home   Recommendations for Outpatient Follow-up:  1. Follow up with PCP in 1-week 2. Please resume Plavix on 02.04.2018  Home Health: No  Equipment/Devices: No   Discharge Condition: Stable  CODE STATUS: Full  Diet recommendation: Heart Healthy / Carb Modified   Brief/Interim Summary: This is a 82 yo female with CAD status post stenting who presents with a chief complaint of hematemesis. The day of admission patient presented nausea, vomiting with bright red blood. It has been associated generalized weakness, no abdominal pain or melena. Initial physical examination blood pressure 136/56, heart rate 77, respiratory 15, oxygen saturation 97%. Her mucous membranes was moist, lungs were clear to auscultation bilaterally, heart S1-S2 present rhythmic, her abdomen was soft, no guarding, lower extremities edema. Sodium 138, potassium 6.4, chloride 108, bicarbonate 22, glucose 194, BUN 30, creatinine 0.96, white count 7.3, hemoglobin 10.5, hematocrit 32.9, platelets 169. Fecal occult blood was positive. Chest x-ray was negative, noted right upper lung nodule (stable). EKG was sinus rhythm with a right bundle branch block.   The patient was admitted to the hospital with working diagnosis of hematemesis suspected to be related to upper GI bleed.  1. Acute upper GI bleed. Patient was admitted to the medical unit with a telemetry monitor, aspirin and Plavix were held, patient was started on Protonix intravenously. Patient was seen by gastroenterology, patient was intervened with upper endoscopy showed grade 3 esophageal varices which were incompletely eradicated/banded, portal hypertensive gastropathy, normal duodenum. CT of the abdomen showed liver cirrhosis with a small paraumbilical and lower  esophageal varices. No ascites. Normal spleen. She was resumed on aspirin and instruction to resume Plavix on February 4. He had mild nausea post procedure. Patient will be discharged with protonix daily.   2. Anemia. Probably multifactorial, her hemoglobin remained stable. Iron stores show no iron deficiency.  3. CAD. No chest pain. Patient was continued on antiplatelet therapy with aspirin, beta blockade with metoprolol., Statin therapy. Resume Plavix on February 4.  4. Hyperkalemia. Potassium supplements will be held, will recommend follow-up kidney function as an outpatient.  5. Type 2 diabetes mellitus. Patient was continued on long-acting insulin, 75/25 insulin, capillary glucose 232, 138. Will resume metformin at the time of discharge.   6. Hypothyroid. Patient will continue levothyroxine.  Discharge Diagnoses:  Principal Problem:   Upper gastrointestinal bleed Active Problems:   IDDM (insulin dependent diabetes mellitus) (HCC)   Essential hypertension   Hypothyroidism   Hematemesis   Anemia   Hyperkalemia   CAD (coronary artery disease)    Discharge Instructions  Discharge Instructions    Diet - low sodium heart healthy    Complete by:  As directed    Discharge instructions    Complete by:  As directed    Please follow with primary care in 7 days.   Increase activity slowly    Complete by:  As directed      Allergies as of 06/17/2016      Reactions   Demerol [meperidine] Other (See Comments)   Reaction:  Unknown    Penicillins Other (See Comments)   Reaction:  Weakness  Has patient had a PCN reaction causing immediate rash, facial/tongue/throat swelling, SOB or lightheadedness with hypotension: No Has patient had a PCN reaction causing severe rash involving mucus membranes or skin necrosis: No Has patient  had a PCN reaction that required hospitalization Yes Has patient had a PCN reaction occurring within the last 10 years: No If all of the above answers are "NO",  then may proceed with Cephalosporin use.      Medication List    STOP taking these medications   potassium chloride 10 MEQ tablet Commonly known as:  K-DUR,KLOR-CON     TAKE these medications   aspirin EC 81 MG tablet Take 81 mg by mouth daily.   clopidogrel 75 MG tablet Commonly known as:  PLAVIX Take 1 tablet (75 mg total) by mouth at bedtime. Please resume taking on 06/19/2016 What changed:  additional instructions   doxycycline 100 MG capsule Commonly known as:  VIBRAMYCIN Take 100 mg by mouth daily.   furosemide 40 MG tablet Commonly known as:  LASIX Take 20 mg by mouth 2 (two) times daily.   insulin glargine 100 UNIT/ML injection Commonly known as:  LANTUS Inject 50 Units into the skin at bedtime.   insulin lispro protamine-lispro (75-25) 100 UNIT/ML Susp injection Commonly known as:  HUMALOG 75/25 MIX Inject 10 Units into the skin 3 (three) times daily.   levothyroxine 200 MCG tablet Commonly known as:  SYNTHROID, LEVOTHROID Take 200 mcg by mouth daily before breakfast.   metFORMIN 500 MG tablet Commonly known as:  GLUCOPHAGE Take 1,000 mg by mouth 2 (two) times daily with a meal.   metoprolol 50 MG tablet Commonly known as:  LOPRESSOR Take 50 mg by mouth 2 (two) times daily.   nitroGLYCERIN 0.4 MG SL tablet Commonly known as:  NITROSTAT Place 0.4 mg under the tongue every 5 (five) minutes as needed for chest pain.   pantoprazole 40 MG tablet Commonly known as:  PROTONIX Take 1 tablet (40 mg total) by mouth 2 (two) times daily.   simvastatin 20 MG tablet Commonly known as:  ZOCOR Take 20 mg by mouth every evening.      Follow-up Information    ARONSON,[name], MD Follow up in 1 week(s).   Specialty:  Internal Medicine Contact information: [place] [address] [phone]          Allergies  Allergen Reactions  . Demerol [Meperidine] Other (See Comments)    Reaction:  Unknown   . Penicillins Other (See Comments)     Reaction:  Weakness  Has patient had a PCN reaction causing immediate rash, facial/tongue/throat swelling, SOB or lightheadedness with hypotension: No Has patient had a PCN reaction causing severe rash involving mucus membranes or skin necrosis: No Has patient had a PCN reaction that required hospitalization Yes Has patient had a PCN reaction occurring within the last 10 years: No If all of the above answers are "NO", then may proceed with Cephalosporin use.    Consultations:  Gastroenterology    Procedures/Studies: Dg Chest 2 View  Result Date: 06/09/2016 CLINICAL DATA:  Bilateral upper extremity numbness.  Weakness. EXAM: CHEST  2 VIEW COMPARISON:  06/20/2014 FINDINGS: Heart and mediastinal contours are within normal limits. No focal opacities or effusions. No acute bony abnormality. IMPRESSION: No active cardiopulmonary disease. Electronically Signed   By: Rolm Baptise M.D.   On: 06/09/2016 12:01   Mr Brain Wo Contrast  Result Date: 06/09/2016 CLINICAL DATA:  Bilateral arm numbness, weakness.  Vision change EXAM: MRI HEAD WITHOUT CONTRAST TECHNIQUE: Multiplanar, multiecho pulse sequences of the brain and surrounding structures were obtained without intravenous contrast. COMPARISON:  MRI 05/17/2006 FINDINGS: Brain: Image quality degraded by mild motion. Ventricle size and  cerebral volume normal for age. Negative for acute infarct. No significant chronic ischemia. Negative for hemorrhage or mass. No shift of the midline structures. Vascular: Normal arterial flow voids. Skull and upper cervical spine: Negative Sinuses/Orbits: Negative Other: None IMPRESSION: Normal for age MRI of the brain without contrast. Image quality degraded by motion. Electronically Signed   By: Franchot Gallo M.D.   On: 06/09/2016 14:08   Ct Abdomen Pelvis W Contrast  Result Date: 06/15/2016 CLINICAL DATA:  82 year old female inpatient admitted with hematemesis, with moderate-sized varices in the lower 2/3 of the  esophagus, portal gastropathy and antral and duodenal erosions on upper endoscopy. EXAM: CT ABDOMEN AND PELVIS WITH CONTRAST TECHNIQUE: Multidetector CT imaging of the abdomen and pelvis was performed using the standard protocol following bolus administration of intravenous contrast. CONTRAST:  100 cc ISOVUE-300 IOPAMIDOL (ISOVUE-300) INJECTION 61% COMPARISON:  03/01/2013 abdominal sonogram. 10/07/2005 CT angiogram of the abdomen and pelvis. FINDINGS: Lower chest: No significant pulmonary nodules or acute consolidative airspace disease. Three-vessel coronary atherosclerosis. Small varices are noted surrounding the lower third of the thoracic esophagus. Hepatobiliary: There is mild relative hypertrophy of the lateral segment left liver lobe, and the liver surface is diffusely irregular, indicating hepatic cirrhosis. No liver mass. Cholecystectomy. No biliary ductal dilatation. Common bile duct diameter 5 mm. Pancreas: Diffuse fatty infiltration of the otherwise normal pancreas, with no pancreatic mass or duct dilation. Multiple small to moderate periampullary duodenal diverticula. Spleen: Normal size spleen. No splenic mass. Scattered granulomatous calcifications in the spleen. Adrenals/Urinary Tract: Normal adrenals. Simple 2.4 cm lower left renal cyst. Subcentimeter hypodense renal cortical lesion in the lateral interpolar left kidney is too small to characterize and requires no further follow-up ( This recommendation follows ACR consensus guidelines: Management of the Incidental Renal Mass on CT: A White Paper of the ACR Incidental Findings Committee. J Am Coll Radiol 2017; article in press.). No hydronephrosis. Normal bladder. Stomach/Bowel: Small hiatal hernia. Mild circumferential wall thickening in the gastric antrum. Normal caliber small bowel with no small bowel wall thickening. Appendectomy. Mild sigmoid diverticulosis, with no large bowel wall thickening or pericolonic fat stranding. Vascular/Lymphatic:  Atherosclerotic nonaneurysmal abdominal aorta. Patent portal, splenic, hepatic and renal veins. Small paraumbilical varices. No pathologically enlarged lymph nodes in the abdomen or pelvis. Reproductive: Status post hysterectomy, with no abnormal findings at the vaginal cuff. No adnexal mass. Other: No pneumoperitoneum, ascites or focal fluid collection. Status post ventral abdominal wall mesh hernia repair . No evidence of a recurrent ventral abdominal hernia. Musculoskeletal: No aggressive appearing focal osseous lesions. Marked thoracolumbar spondylosis. IMPRESSION: 1. Cirrhosis.  No liver mass. 2. Small paraumbilical and lower esophageal varices. 3. No ascites.  Normal size spleen. 4. Nonspecific mild circumferential wall thickening in the gastric antrum, correlate with the recent upper endoscopy findings. 5. Additional findings include aortic atherosclerosis, three-vessel coronary atherosclerosis, small hiatal hernia and mild sigmoid diverticulosis. Electronically Signed   By: Ilona Sorrel M.D.   On: 06/15/2016 07:41   Dg Chest Portable 1 View  Result Date: 06/14/2016 CLINICAL DATA:  Onset nausea and vomiting this morning. Patient reports hematemesis. EXAM: PORTABLE CHEST 1 VIEW COMPARISON:  PA and lateral chest 06/09/2016 and 01/14/2007. CT chest 01/14/2007. FINDINGS: Right upper lobe nodule is unchanged. Lungs otherwise clear. Heart size normal. No pneumothorax or pleural effusion. IMPRESSION: No acute disease. Electronically Signed   By: Inge Rise M.D.   On: 06/14/2016 10:09    (Echo, Carotid, EGD, Colonoscopy, ERCP)    Subjective: Had mild nausea this am  after breakfast. No chest pain, no dyspnea. No fevers.   Discharge Exam: Vitals:   06/16/16 2100 06/17/16 0420  BP: (!) 157/60 (!) 133/94  Pulse: 80 73  Resp: 18 16  Temp: 97.7 F (36.5 C) 97.5 F (36.4 C)   Vitals:   06/16/16 1045 06/16/16 1833 06/16/16 2100 06/17/16 0420  BP: (!) 146/78 138/80 (!) 157/60 (!) 133/94  Pulse:  (!) 105 100 80 73  Resp: 20 20 18 16   Temp:  98 F (36.7 C) 97.7 F (36.5 C) 97.5 F (36.4 C)  TempSrc:  Oral Oral Oral  SpO2: 97% 97% 97% 95%  Weight:   89.4 kg (197 lb 1.6 oz)   Height:        General: Pt is alert, awake, not in acute distress Cardiovascular: RRR, S1/S2 +, no rubs, no gallops Respiratory: CTA bilaterally, no wheezing, no rhonchi Abdominal: Soft, NT, ND, bowel sounds + Extremities: no edema, no cyanosis    The results of significant diagnostics from this hospitalization (including imaging, microbiology, ancillary and laboratory) are listed below for reference.     Microbiology: No results found for this or any previous visit (from the past 240 hour(s)).   Labs: BNP (last 3 results) No results for input(s): BNP in the last 8760 hours. Basic Metabolic Panel:  Recent Labs Lab 06/14/16 0939 06/14/16 1215 06/15/16 0636 06/16/16 1118 06/17/16 0743  NA 138  --  139 141 141  K 6.4* 5.5* 3.9 3.9 4.0  CL 108  --  109 110 108  CO2 22  --  22 23 23   GLUCOSE 194*  --  77 127* 155*  BUN 30*  --  27* 15 13  CREATININE 0.96  --  0.77 0.77 0.85  CALCIUM 8.8*  --  8.6* 9.1 9.5   Liver Function Tests:  Recent Labs Lab 06/14/16 0939  AST 32  ALT 21  ALKPHOS 126  BILITOT 0.9  PROT 6.1*  ALBUMIN 2.9*   No results for input(s): LIPASE, AMYLASE in the last 168 hours. No results for input(s): AMMONIA in the last 168 hours. CBC:  Recent Labs Lab 06/14/16 0939 06/14/16 1215 06/15/16 0636 06/15/16 2133 06/16/16 1118 06/16/16 2317  WBC 7.3  --  5.1 4.0 4.5 4.9  NEUTROABS 4.9  --   --   --   --  2.4  HGB 10.5* 10.2* 9.7* 9.5* 9.9* 9.6*  HCT 32.9* 31.7* 29.0* 28.8* 29.8* 28.6*  MCV 91.4  --  90.1 89.7 89.8 89.9  PLT 169  --  125* 137* 128* 143*   Cardiac Enzymes: No results for input(s): CKTOTAL, CKMB, CKMBINDEX, TROPONINI in the last 168 hours. BNP: Invalid input(s): POCBNP CBG:  Recent Labs Lab 06/16/16 0811 06/16/16 1142 06/16/16 1704  06/16/16 2057 06/17/16 0755  GLUCAP 130* 120* 207* 232* 138*   D-Dimer No results for input(s): DDIMER in the last 72 hours. Hgb A1c  Recent Labs  06/14/16 1215  HGBA1C 10.0*   Lipid Profile No results for input(s): CHOL, HDL, LDLCALC, TRIG, CHOLHDL, LDLDIRECT in the last 72 hours. Thyroid function studies No results for input(s): TSH, T4TOTAL, T3FREE, THYROIDAB in the last 72 hours.  Invalid input(s): FREET3 Anemia work up  Recent Labs  06/15/16 1516  FERRITIN 49  TIBC 333  IRON 67   Urinalysis    Component Value Date/Time   COLORURINE YELLOW 06/09/2016 [place] 06/09/2016 1146   LABSPEC >1.030 (H) 06/09/2016 1146   PHURINE 5.5 06/09/2016 1146  GLUCOSEU NEGATIVE 06/09/2016 1146   HGBUR NEGATIVE 06/09/2016 1146   BILIRUBINUR MODERATE (A) 06/09/2016 1146   KETONESUR NEGATIVE 06/09/2016 1146   PROTEINUR NEGATIVE 06/09/2016 1146   UROBILINOGEN 1.0 06/16/2014 2138   NITRITE NEGATIVE 06/09/2016 1146   LEUKOCYTESUR SMALL (A) 06/09/2016 1146   Sepsis Labs Invalid input(s): PROCALCITONIN,  WBC,  LACTICIDVEN Microbiology No results found for this or any previous visit (from the past 240 hour(s)).   Time coordinating discharge: 45 minutes  SIGNED:   Tawni Millers, MD  [facility] 06/17/2016, 10:20 AM Pager   If 7PM-7AM, please contact night-coverage [email or web address] Password [email or web address]

## 2016-06-17 NOTE — Progress Notes (Signed)
Patient discharged to home after no complaints of nausea. AVS reviwed with patient by Jonni Sanger. Patient left the floor via wheelchair with nursing student and family member

## 2016-06-17 NOTE — Progress Notes (Signed)
[facility] Progress Note  Subjective: Patient complaining of some mild dysphagia and regurgitation after banding yesterday.  Objective: Vital signs in last 24 hours: Temp:  [97.5 F (36.4 C)-98 F (36.7 C)] 97.5 F (36.4 C) (02/02 0420) Pulse Rate:  [73-100] 79 (02/02 1020) Resp:  [16-20] 16 (02/02 0420) BP: (133-157)/(56-94) 139/56 (02/02 1020) SpO2:  [95 %-97 %] 95 % (02/02 0420) Weight:  [89.4 kg (197 lb 1.6 oz)] 89.4 kg (197 lb 1.6 oz) (02/01 2100) Weight change: 0 kg (0 lb)   PE: Unchanged  Lab Results: Results for orders placed or performed during the hospital encounter of 06/14/16 (from the past 24 hour(s))  Glucose, capillary     Status: Abnormal   Collection Time: 06/16/16  5:04 PM  Result Value Ref Range   Glucose-Capillary 207 (H) 65 - 99 mg/dL   Comment 1 W   Glucose, capillary     Status: Abnormal   Collection Time: 06/16/16  8:57 PM  Result Value Ref Range   Glucose-Capillary 232 (H) 65 - 99 mg/dL  CBC with Differential/Platelet     Status: Abnormal   Collection Time: 06/16/16 11:17 PM  Result Value Ref Range   WBC 4.9 4.0 - 10.5 K/uL   RBC 3.18 (L) 3.87 - 5.11 MIL/uL   Hemoglobin 9.6 (L) 12.0 - 15.0 g/dL   HCT 28.6 (L) 36.0 - 46.0 %   MCV 89.9 78.0 - 100.0 fL   MCH 30.2 26.0 - 34.0 pg   MCHC 33.6 30.0 - 36.0 g/dL   RDW 13.8 11.5 - 15.5 %   Platelets 143 (L) 150 - 400 K/uL   Neutrophils Relative % 48 %   Neutro Abs 2.4 1.7 - 7.7 K/uL   Lymphocytes Relative 38 %   Lymphs Abs 1.9 0.7 - 4.0 K/uL   Monocytes Relative 10 %   Monocytes Absolute 0.5 0.1 - 1.0 K/uL   Eosinophils Relative 4 %   Eosinophils Absolute 0.2 0.0 - 0.7 K/uL   Basophils Relative 0 %   Basophils Absolute 0.0 0.0 - 0.1 K/uL  Basic metabolic panel     Status: Abnormal   Collection Time: 06/17/16  7:43 AM  Result Value Ref Range   Sodium 141 135 - 145 mmol/L   Potassium 4.0 3.5 - 5.1 mmol/L   Chloride 108 101 - 111 mmol/L   CO2 23 22 - 32 mmol/L   Glucose, Bld 155 (H)  65 - 99 mg/dL   BUN 13 6 - 20 mg/dL   Creatinine, Ser 0.85 0.44 - 1.00 mg/dL   Calcium 9.5 8.9 - 10.3 mg/dL   GFR calc non Af Amer >60 >60 mL/min   GFR calc Af Amer >60 >60 mL/min   Anion gap 10 5 - 15  Glucose, capillary     Status: Abnormal   Collection Time: 06/17/16  7:55 AM  Result Value Ref Range   Glucose-Capillary 138 (H) 65 - 99 mg/dL  Glucose, capillary     Status: Abnormal   Collection Time: 06/17/16 11:39 AM  Result Value Ref Range   Glucose-Capillary 198 (H) 65 - 99 mg/dL   Comment 1 Notify RN    Comment 2 Document in Chart     Studies/Results: No results found.    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 Soft gel varices, status post banding and already on a beta blocker  Plan: Okay to discharge today or tomorrow. I will follow-up the next 2  or 3 weeks and pursue repeat EGD for inspection and possible further banding.    Satina Jerrell C 06/17/2016, 12:42 PM  Pager [phone] If no answer or after 5 PM call [phone]

## 2016-06-23 DIAGNOSIS — R945 Abnormal results of liver function studies: Secondary | ICD-10-CM | POA: Diagnosis not present

## 2016-06-23 DIAGNOSIS — D692 Other nonthrombocytopenic purpura: Secondary | ICD-10-CM | POA: Diagnosis not present

## 2016-06-23 DIAGNOSIS — M25473 Effusion, unspecified ankle: Secondary | ICD-10-CM | POA: Diagnosis not present

## 2016-06-23 DIAGNOSIS — Z6830 Body mass index (BMI) 30.0-30.9, adult: Secondary | ICD-10-CM | POA: Diagnosis not present

## 2016-06-23 DIAGNOSIS — I872 Venous insufficiency (chronic) (peripheral): Secondary | ICD-10-CM | POA: Diagnosis not present

## 2016-06-23 DIAGNOSIS — F329 Major depressive disorder, single episode, unspecified: Secondary | ICD-10-CM | POA: Diagnosis not present

## 2016-06-23 DIAGNOSIS — R160 Hepatomegaly, not elsewhere classified: Secondary | ICD-10-CM | POA: Diagnosis not present

## 2016-06-23 DIAGNOSIS — E038 Other specified hypothyroidism: Secondary | ICD-10-CM | POA: Diagnosis not present

## 2016-06-23 DIAGNOSIS — L218 Other seborrheic dermatitis: Secondary | ICD-10-CM | POA: Diagnosis not present

## 2016-06-23 DIAGNOSIS — L280 Lichen simplex chronicus: Secondary | ICD-10-CM | POA: Diagnosis not present

## 2016-06-23 DIAGNOSIS — I10 Essential (primary) hypertension: Secondary | ICD-10-CM | POA: Diagnosis not present

## 2016-06-23 DIAGNOSIS — K7460 Unspecified cirrhosis of liver: Secondary | ICD-10-CM | POA: Diagnosis not present

## 2016-06-23 DIAGNOSIS — I8501 Esophageal varices with bleeding: Secondary | ICD-10-CM | POA: Diagnosis not present

## 2016-06-23 DIAGNOSIS — Z85828 Personal history of other malignant neoplasm of skin: Secondary | ICD-10-CM | POA: Diagnosis not present

## 2016-06-23 DIAGNOSIS — E1159 Type 2 diabetes mellitus with other circulatory complications: Secondary | ICD-10-CM | POA: Diagnosis not present

## 2016-06-27 DIAGNOSIS — K7469 Other cirrhosis of liver: Secondary | ICD-10-CM | POA: Diagnosis not present

## 2016-06-27 DIAGNOSIS — H353131 Nonexudative age-related macular degeneration, bilateral, early dry stage: Secondary | ICD-10-CM | POA: Diagnosis not present

## 2016-06-27 DIAGNOSIS — H5371 Glare sensitivity: Secondary | ICD-10-CM | POA: Diagnosis not present

## 2016-06-27 DIAGNOSIS — E113411 Type 2 diabetes mellitus with severe nonproliferative diabetic retinopathy with macular edema, right eye: Secondary | ICD-10-CM | POA: Diagnosis not present

## 2016-06-27 DIAGNOSIS — J321 Chronic frontal sinusitis: Secondary | ICD-10-CM | POA: Diagnosis not present

## 2016-06-27 DIAGNOSIS — I8501 Esophageal varices with bleeding: Secondary | ICD-10-CM | POA: Diagnosis not present

## 2016-06-29 ENCOUNTER — Telehealth: Payer: Self-pay | Admitting: Cardiovascular Disease

## 2016-06-29 NOTE — Telephone Encounter (Signed)
Will fax this clearance note from Dr Johnsie Cancel to Loma Sousa at Dr. Llana Aliment office.  Pam RN for Dr Johnsie Cancel will fax this note.

## 2016-06-29 NOTE — Telephone Encounter (Signed)
Mustang from Dr. Llana Aliment office is calling to verify that Dr. Johnsie Cancel has received the fax regarding stopping  Request for surgical clearance:  1. What type of surgery is being performed? Endoscopy  2. When is this surgery scheduled? 07-07-16  3. Are there any medications that need to be held prior to surgery and how long? clopidogrel (PLAVIX) 75 MG tablet  4. Name of physician performing surgery? John Haze  5. What is your office phone and fax number?

## 2016-06-29 NOTE — Telephone Encounter (Signed)
Ok to stop plavix for 5 days to do endoscopy

## 2016-06-29 NOTE — Telephone Encounter (Signed)
Dr Johnsie Cancel, triage cannot locate this surgical clearance that's needed for this pt.   Dr. Carlyn Reichert will be doing a Endoscopy with banding on this pt on 07/07/16 at [facility].   He needs clearance from you stating that this pt can hold Plavix 7 days prior too this procedure.   Faxed clearance should go to [phone] ATTENTION:  Loma Sousa.   Send a message to triage and we will be glad to fax your response to Dr. Llana Aliment office.

## 2016-07-01 ENCOUNTER — Other Ambulatory Visit: Payer: Self-pay | Admitting: Gastroenterology

## 2016-07-06 ENCOUNTER — Encounter (HOSPITAL_COMMUNITY): Payer: Self-pay | Admitting: *Deleted

## 2016-07-06 NOTE — Progress Notes (Signed)
Pt denies SOB and chest pain. Pt stated that her last dose of Plavix was Saturday. Pt made aware of diabetes protocol to take 25 units of Lantus tonight, 7 units of Humalog 75/25 tonight, No Humalog or Metformin DOS. Pt made aware to check BG every 2 hours prior to arrival, interventions for BG < 70 (4 ounces of Apple or Cranberry juice recheck BG 15 minutes after drinking juice, if BG still < 70 call Endo ). Pt verbalized understanding of all pre-op instructions.

## 2016-07-07 ENCOUNTER — Encounter (HOSPITAL_COMMUNITY)
Admission: RE | Disposition: A | Discharge status: Home / Self Care | Payer: Self-pay | Source: Ambulatory Visit | Attending: Gastroenterology

## 2016-07-07 ENCOUNTER — Ambulatory Visit (HOSPITAL_COMMUNITY): Payer: Medicare Other | Admitting: Anesthesiology

## 2016-07-07 ENCOUNTER — Encounter (HOSPITAL_COMMUNITY): Payer: Self-pay

## 2016-07-07 ENCOUNTER — Ambulatory Visit (HOSPITAL_COMMUNITY)
Admission: RE | Admit: 2016-07-07 | Discharge: 2016-07-07 | Disposition: A | Discharge status: Home / Self Care | Payer: Medicare Other | Source: Ambulatory Visit | Attending: Gastroenterology | Admitting: Gastroenterology

## 2016-07-07 DIAGNOSIS — I252 Old myocardial infarction: Secondary | ICD-10-CM | POA: Insufficient documentation

## 2016-07-07 DIAGNOSIS — K7469 Other cirrhosis of liver: Secondary | ICD-10-CM | POA: Diagnosis not present

## 2016-07-07 DIAGNOSIS — Z9849 Cataract extraction status, unspecified eye: Secondary | ICD-10-CM | POA: Insufficient documentation

## 2016-07-07 DIAGNOSIS — I2510 Atherosclerotic heart disease of native coronary artery without angina pectoris: Secondary | ICD-10-CM | POA: Diagnosis not present

## 2016-07-07 DIAGNOSIS — Z7902 Long term (current) use of antithrombotics/antiplatelets: Secondary | ICD-10-CM | POA: Diagnosis not present

## 2016-07-07 DIAGNOSIS — M1990 Unspecified osteoarthritis, unspecified site: Secondary | ICD-10-CM | POA: Diagnosis not present

## 2016-07-07 DIAGNOSIS — Z880 Allergy status to penicillin: Secondary | ICD-10-CM | POA: Diagnosis not present

## 2016-07-07 DIAGNOSIS — I10 Essential (primary) hypertension: Secondary | ICD-10-CM | POA: Diagnosis not present

## 2016-07-07 DIAGNOSIS — Z90710 Acquired absence of both cervix and uterus: Secondary | ICD-10-CM | POA: Insufficient documentation

## 2016-07-07 DIAGNOSIS — I8501 Esophageal varices with bleeding: Secondary | ICD-10-CM | POA: Diagnosis not present

## 2016-07-07 DIAGNOSIS — E119 Type 2 diabetes mellitus without complications: Secondary | ICD-10-CM | POA: Insufficient documentation

## 2016-07-07 DIAGNOSIS — Z885 Allergy status to narcotic agent status: Secondary | ICD-10-CM | POA: Diagnosis not present

## 2016-07-07 DIAGNOSIS — Z8249 Family history of ischemic heart disease and other diseases of the circulatory system: Secondary | ICD-10-CM | POA: Diagnosis not present

## 2016-07-07 DIAGNOSIS — Z794 Long term (current) use of insulin: Secondary | ICD-10-CM | POA: Diagnosis not present

## 2016-07-07 DIAGNOSIS — E785 Hyperlipidemia, unspecified: Secondary | ICD-10-CM | POA: Diagnosis not present

## 2016-07-07 DIAGNOSIS — D649 Anemia, unspecified: Secondary | ICD-10-CM | POA: Insufficient documentation

## 2016-07-07 DIAGNOSIS — Z809 Family history of malignant neoplasm, unspecified: Secondary | ICD-10-CM | POA: Diagnosis not present

## 2016-07-07 DIAGNOSIS — Z79899 Other long term (current) drug therapy: Secondary | ICD-10-CM | POA: Diagnosis not present

## 2016-07-07 DIAGNOSIS — E039 Hypothyroidism, unspecified: Secondary | ICD-10-CM | POA: Diagnosis not present

## 2016-07-07 DIAGNOSIS — Z7982 Long term (current) use of aspirin: Secondary | ICD-10-CM | POA: Diagnosis not present

## 2016-07-07 DIAGNOSIS — I8500 Esophageal varices without bleeding: Secondary | ICD-10-CM | POA: Insufficient documentation

## 2016-07-07 DIAGNOSIS — Z955 Presence of coronary angioplasty implant and graft: Secondary | ICD-10-CM | POA: Diagnosis not present

## 2016-07-07 DIAGNOSIS — E875 Hyperkalemia: Secondary | ICD-10-CM | POA: Diagnosis not present

## 2016-07-07 HISTORY — PX: GASTRIC VARICES BANDING: SHX5519

## 2016-07-07 HISTORY — PX: ESOPHAGOGASTRODUODENOSCOPY (EGD) WITH PROPOFOL: SHX5813

## 2016-07-07 LAB — GLUCOSE, CAPILLARY: GLUCOSE-CAPILLARY: 149 mg/dL — AB (ref 65–99)

## 2016-07-07 SURGERY — ESOPHAGOGASTRODUODENOSCOPY (EGD) WITH PROPOFOL
Anesthesia: Monitor Anesthesia Care

## 2016-07-07 MED ORDER — ONDANSETRON HCL 4 MG/2ML IJ SOLN
INTRAMUSCULAR | Status: DC | PRN
Start: 2016-07-07 — End: 2016-07-07
  Administered 2016-07-07: 4 mg via INTRAVENOUS

## 2016-07-07 MED ORDER — PROPOFOL 500 MG/50ML IV EMUL
INTRAVENOUS | Status: DC | PRN
  Administered 2016-07-07: 100 ug/kg/min via INTRAVENOUS

## 2016-07-07 MED ORDER — CLOPIDOGREL BISULFATE 75 MG PO TABS: 75.0000 mg | ORAL_TABLET | Freq: Every day | ORAL | 0 refills | Status: DC

## 2016-07-07 MED ORDER — BUTAMBEN-TETRACAINE-BENZOCAINE 2-2-14 % EX AERO
INHALATION_SPRAY | CUTANEOUS | Status: DC | PRN
  Administered 2016-07-07: 1 via TOPICAL

## 2016-07-07 MED ORDER — FENTANYL CITRATE (PF) 100 MCG/2ML IJ SOLN: 25.0000 ug | INTRAMUSCULAR | Status: DC | PRN

## 2016-07-07 MED ORDER — LACTATED RINGERS IV SOLN
INTRAVENOUS | Status: DC | PRN
  Administered 2016-07-07: 10:00:00 via INTRAVENOUS

## 2016-07-07 MED ORDER — SODIUM CHLORIDE 0.9 % IV SOLN: INTRAVENOUS | Status: DC

## 2016-07-07 MED ORDER — ACETAMINOPHEN 325 MG PO TABS
650.0000 mg | ORAL_TABLET | Freq: Once | ORAL | Status: AC
  Administered 2016-07-07: 650 mg via ORAL
  Filled 2016-07-07: qty 2

## 2016-07-07 NOTE — Anesthesia Procedure Notes (Signed)
Procedure Name: MAC Date/Time: 07/07/2016 10:25 AM Performed by: Eligha Bridegroom Pre-anesthesia Checklist: Patient identified, Emergency Drugs available, Suction available, Patient being monitored and Timeout performed Patient Re-evaluated:Patient Re-evaluated prior to inductionOxygen Delivery Method: Nasal cannula Intubation Type: IV induction

## 2016-07-07 NOTE — Discharge Instructions (Signed)

## 2016-07-07 NOTE — Progress Notes (Signed)
Pt given tylenol for mild throat pain following procedure.  Pt able to swallow pills but is unable to swallow water without regurgitating it.  Pt is not nauseous.  Dr. Amedeo Plenty notified, ok to proceed with discharge.  Per MD, patient instructed to take small sips of liquids at home.    Vista Lawman, RN

## 2016-07-07 NOTE — Transfer of Care (Signed)
Immediate Anesthesia Transfer of Care Note  Patient: Tonya Hunter  Procedure(s) Performed: Procedure(s): ESOPHAGOGASTRODUODENOSCOPY (EGD) WITH PROPOFOL (N/A) GASTRIC VARICES BANDING (N/A)  Patient Location: Endoscopy Unit  Anesthesia Type:MAC  Level of Consciousness: awake, alert  and oriented  Airway & Oxygen Therapy: Patient Spontanous Breathing  Post-op Assessment: Report given to RN and Post -op Vital signs reviewed and stable  Post vital signs: Reviewed and stable  Last Vitals:  Vitals:   07/07/16 1110 07/07/16 1120  BP: (!) 164/58   Pulse: 66 69  Resp: 14 14  Temp:      Last Pain:  Vitals:   07/07/16 1100  TempSrc: Oral         Complications: No apparent anesthesia complications

## 2016-07-07 NOTE — Interval H&P Note (Signed)
History and Physical Interval Note:  07/07/2016 10:05 AM  Tonya Hunter  has presented today for surgery, with the diagnosis of esoph. varicies  The various methods of treatment have been discussed with the patient and family. After consideration of risks, benefits and other options for treatment, the patient has consented to  Procedure(s): ESOPHAGOGASTRODUODENOSCOPY (EGD) WITH PROPOFOL (N/A) GASTRIC VARICES BANDING (N/A) as a surgical intervention .  The patient's history has been reviewed, patient examined, no change in status, stable for surgery.  I have reviewed the patient's chart and labs.  Questions were answered to the patient's satisfaction.     Ugo Thoma C

## 2016-07-07 NOTE — Anesthesia Postprocedure Evaluation (Signed)
Anesthesia Post Note  Patient: Tonya Hunter  Procedure(s) Performed: Procedure(s) (LRB): ESOPHAGOGASTRODUODENOSCOPY (EGD) WITH PROPOFOL (N/A) GASTRIC VARICES BANDING (N/A)  Patient location during evaluation: PACU Anesthesia Type: MAC Level of consciousness: awake and alert Pain management: pain level controlled Vital Signs Assessment: post-procedure vital signs reviewed and stable Respiratory status: spontaneous breathing, nonlabored ventilation and respiratory function stable Cardiovascular status: stable and blood pressure returned to baseline Anesthetic complications: no       Last Vitals:  Vitals:   07/07/16 1140 07/07/16 1150  BP: (!) 174/71 140/80  Pulse: 65 68  Resp: 15 15  Temp:      Last Pain:  Vitals:   07/07/16 1205  TempSrc:   PainSc: 3                  Naeema Patlan,W. EDMOND

## 2016-07-07 NOTE — Anesthesia Preprocedure Evaluation (Addendum)
Anesthesia Evaluation  Patient identified by MRN, date of birth, ID band Patient awake    Reviewed: Allergy & Precautions, H&P , NPO status , Patient's Chart, lab work & pertinent test results, reviewed documented beta blocker date and time   Airway Mallampati: III  TM Distance: >3 FB Neck ROM: Full    Dental no notable dental hx. (+) Teeth Intact, Dental Advisory Given   Pulmonary neg pulmonary ROS,    Pulmonary exam normal breath sounds clear to auscultation       Cardiovascular hypertension, Pt. on medications and Pt. on home beta blockers + CAD, + Past MI and + Cardiac Stents   Rhythm:Regular Rate:Normal     Neuro/Psych negative neurological ROS  negative psych ROS   GI/Hepatic negative GI ROS, Neg liver ROS,   Endo/Other  diabetes, Insulin DependentHypothyroidism   Renal/GU negative Renal ROS  negative genitourinary   Musculoskeletal  (+) Arthritis , Osteoarthritis,    Abdominal   Peds  Hematology negative hematology ROS (+) anemia ,   Anesthesia Other Findings   Reproductive/Obstetrics negative OB ROS                            Anesthesia Physical Anesthesia Plan  ASA: III  Anesthesia Plan: MAC   Post-op Pain Management:    Induction: Intravenous  Airway Management Planned: Nasal Cannula  Additional Equipment:   Intra-op Plan:   Post-operative Plan:   Informed Consent: I have reviewed the patients History and Physical, chart, labs and discussed the procedure including the risks, benefits and alternatives for the proposed anesthesia with the patient or authorized representative who has indicated his/her understanding and acceptance.   Dental advisory given  Plan Discussed with: CRNA  Anesthesia Plan Comments:        Anesthesia Quick Evaluation

## 2016-07-07 NOTE — H&P (View-Only) (Signed)
Pt denies SOB and chest pain. Pt stated that her last dose of Plavix was Saturday. Pt made aware of diabetes protocol to take 25 units of Lantus tonight, 7 units of Humalog 75/25 tonight, No Humalog or Metformin DOS. Pt made aware to check BG every 2 hours prior to arrival, interventions for BG < 70 (4 ounces of Apple or Cranberry juice recheck BG 15 minutes after drinking juice, if BG still < 70 call Endo ). Pt verbalized understanding of all pre-op instructions.

## 2016-07-07 NOTE — Op Note (Signed)
[facility] Patient Name: Tonya Hunter Procedure Date : 07/07/2016 MRN: [id number] Attending MD: Missy Sabins , MD Date of Birth: [birth year] CSN: [id number] Age: 82 Admit Type: Outpatient Procedure:                Upper GI endoscopy Indications:              For therapy of esophageal varices Providers:                Ladarrious Kirksey C. Amedeo Plenty, MD, Cleda Daub, RN, Alfonso Patten,                            Technician, Eligha Bridegroom CRNA, CRNA Referring MD:              Medicines:                Propofol per Anesthesia Complications:            No immediate complications. Estimated Blood Loss:     Estimated blood loss was minimal. Procedure:                Pre-Anesthesia Assessment:                           - Prior to the procedure, a History and Physical                            was performed, and patient medications and                            allergies were reviewed. The patient's tolerance of                            previous anesthesia was also reviewed. The risks                            and benefits of the procedure and the sedation                            options and risks were discussed with the patient.                            All questions were answered, and informed consent                            was obtained. Prior Anticoagulants: The patient has                            taken Plavix (clopidogrel), last dose was 5 days                            prior to procedure. ASA Grade Assessment: III - A                            patient with severe systemic disease. After  reviewing the risks and benefits, the patient was                            deemed in satisfactory condition to undergo the                            procedure.                           After obtaining informed consent, the endoscope was                            passed under direct vision. Throughout the                            procedure, the patient's  blood pressure, pulse, and                            oxygen saturations were monitored continuously. The                            [id number] ([id number]) scope was introduced through the                            mouth, and advanced to the second part of duodenum.                            The upper GI endoscopy was accomplished without                            difficulty. The patient tolerated the procedure                            well. Scope In: Scope Out: Findings:      Grade II varices were found in the lower third of the esophagus. Four       bands were successfully placed with complete eradication, resulting in       deflation of varices. A slow ooze remained at the end of the procedure.      The entire examined stomach was normal.      The examined duodenum was normal. Impression:               - Grade II esophageal varices. Completely                            eradicated. Banded.                           - Normal stomach.                           - Normal examined duodenum.                           - No specimens collected. Moderate Sedation:      no moderate sedation Recommendation:           -  Patient has a contact number available for                            emergencies. The signs and symptoms of potential                            delayed complications were discussed with the                            patient. Return to normal activities tomorrow.                            Written discharge instructions were provided to the                            patient.                           - Resume previous diet.                           - Continue present medications.                           - Resume Plavix (clopidogrel) at prior dose in 2                            days.                           - Return to my office in 4 weeks. Procedure Code(s):        --- Professional ---                           [phone], Esophagogastroduodenoscopy, flexible,                             transoral; with band ligation of esophageal/gastric                            varices Diagnosis Code(s):        --- Professional ---                           I85.00, Esophageal varices without bleeding CPT copyright 2016 [employer] Association. All rights reserved. The codes documented in this report are preliminary and upon coder review may  be revised to meet current compliance requirements. Missy Sabins, MD 07/07/2016 10:56:39 AM This report has been signed electronically. Number of Addenda: 0

## 2016-07-11 ENCOUNTER — Encounter (HOSPITAL_COMMUNITY): Payer: Self-pay | Admitting: Gastroenterology

## 2016-07-28 DIAGNOSIS — Z9861 Coronary angioplasty status: Secondary | ICD-10-CM | POA: Diagnosis not present

## 2016-07-28 DIAGNOSIS — D692 Other nonthrombocytopenic purpura: Secondary | ICD-10-CM | POA: Diagnosis not present

## 2016-07-28 DIAGNOSIS — I10 Essential (primary) hypertension: Secondary | ICD-10-CM | POA: Diagnosis not present

## 2016-07-28 DIAGNOSIS — E782 Mixed hyperlipidemia: Secondary | ICD-10-CM | POA: Diagnosis not present

## 2016-07-28 DIAGNOSIS — R945 Abnormal results of liver function studies: Secondary | ICD-10-CM | POA: Diagnosis not present

## 2016-07-28 DIAGNOSIS — Z6831 Body mass index (BMI) 31.0-31.9, adult: Secondary | ICD-10-CM | POA: Diagnosis not present

## 2016-07-28 DIAGNOSIS — F329 Major depressive disorder, single episode, unspecified: Secondary | ICD-10-CM | POA: Diagnosis not present

## 2016-07-28 DIAGNOSIS — K7460 Unspecified cirrhosis of liver: Secondary | ICD-10-CM | POA: Diagnosis not present

## 2016-07-28 DIAGNOSIS — E669 Obesity, unspecified: Secondary | ICD-10-CM | POA: Diagnosis not present

## 2016-07-28 DIAGNOSIS — I872 Venous insufficiency (chronic) (peripheral): Secondary | ICD-10-CM | POA: Diagnosis not present

## 2016-07-28 DIAGNOSIS — E1159 Type 2 diabetes mellitus with other circulatory complications: Secondary | ICD-10-CM | POA: Diagnosis not present

## 2016-08-02 ENCOUNTER — Emergency Department (HOSPITAL_COMMUNITY)
Admission: EM | Admit: 2016-08-02 | Discharge: 2016-08-02 | Disposition: A | Discharge status: Home / Self Care | Payer: Medicare Other | Attending: Emergency Medicine | Admitting: Emergency Medicine

## 2016-08-02 ENCOUNTER — Emergency Department (HOSPITAL_COMMUNITY): Payer: Medicare Other

## 2016-08-02 ENCOUNTER — Encounter (HOSPITAL_COMMUNITY): Payer: Self-pay | Admitting: Emergency Medicine

## 2016-08-02 DIAGNOSIS — Z79899 Other long term (current) drug therapy: Secondary | ICD-10-CM | POA: Diagnosis not present

## 2016-08-02 DIAGNOSIS — Z955 Presence of coronary angioplasty implant and graft: Secondary | ICD-10-CM | POA: Insufficient documentation

## 2016-08-02 DIAGNOSIS — I252 Old myocardial infarction: Secondary | ICD-10-CM | POA: Insufficient documentation

## 2016-08-02 DIAGNOSIS — J069 Acute upper respiratory infection, unspecified: Secondary | ICD-10-CM | POA: Diagnosis not present

## 2016-08-02 DIAGNOSIS — H903 Sensorineural hearing loss, bilateral: Secondary | ICD-10-CM | POA: Diagnosis not present

## 2016-08-02 DIAGNOSIS — R002 Palpitations: Secondary | ICD-10-CM | POA: Insufficient documentation

## 2016-08-02 DIAGNOSIS — Z794 Long term (current) use of insulin: Secondary | ICD-10-CM | POA: Insufficient documentation

## 2016-08-02 DIAGNOSIS — I700 Atherosclerosis of aorta: Secondary | ICD-10-CM | POA: Diagnosis not present

## 2016-08-02 DIAGNOSIS — Z96653 Presence of artificial knee joint, bilateral: Secondary | ICD-10-CM | POA: Insufficient documentation

## 2016-08-02 DIAGNOSIS — E039 Hypothyroidism, unspecified: Secondary | ICD-10-CM | POA: Diagnosis not present

## 2016-08-02 DIAGNOSIS — H6122 Impacted cerumen, left ear: Secondary | ICD-10-CM | POA: Diagnosis not present

## 2016-08-02 DIAGNOSIS — I2510 Atherosclerotic heart disease of native coronary artery without angina pectoris: Secondary | ICD-10-CM | POA: Diagnosis not present

## 2016-08-02 DIAGNOSIS — E119 Type 2 diabetes mellitus without complications: Secondary | ICD-10-CM | POA: Diagnosis not present

## 2016-08-02 DIAGNOSIS — Z7982 Long term (current) use of aspirin: Secondary | ICD-10-CM | POA: Insufficient documentation

## 2016-08-02 DIAGNOSIS — I10 Essential (primary) hypertension: Secondary | ICD-10-CM | POA: Insufficient documentation

## 2016-08-02 HISTORY — DX: Esophageal varices without bleeding: I85.00

## 2016-08-02 LAB — BASIC METABOLIC PANEL
ANION GAP: 11 (ref 5–15)
BUN: 11 mg/dL (ref 6–20)
CALCIUM: 9.5 mg/dL (ref 8.9–10.3)
CHLORIDE: 100 mmol/L — AB (ref 101–111)
CO2: 24 mmol/L (ref 22–32)
Creatinine, Ser: 0.89 mg/dL (ref 0.44–1.00)
GFR calc non Af Amer: >60 mL/min (ref >60)
Glucose, Bld: 323 mg/dL — ABNORMAL HIGH (ref 65–99)
Potassium: 4.5 mmol/L (ref 3.5–5.1)
Sodium: 135 mmol/L (ref 135–145)

## 2016-08-02 LAB — CBC
HCT: 34.1 % — ABNORMAL LOW (ref 36.0–46.0)
HEMOGLOBIN: 11.1 g/dL — AB (ref 12.0–15.0)
MCH: 28 pg (ref 26.0–34.0)
MCHC: 32.6 g/dL (ref 30.0–36.0)
MCV: 86.1 fL (ref 78.0–100.0)
Platelets: 192 10*3/uL (ref 150–400)
RBC: 3.96 MIL/uL (ref 3.87–5.11)
RDW: 13.2 % (ref 11.5–15.5)
WBC: 5.8 10*3/uL (ref 4.0–10.5)

## 2016-08-02 LAB — I-STAT TROPONIN, ED: TROPONIN I, POC: 0.01 ng/mL (ref 0.00–0.08)
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
## 2016-08-02 NOTE — Discharge Instructions (Signed)
Follow up with cardiology. Return for any passing out chest pain or rapid heart rate lasting 40 minutes or longer. Cardiac monitoring here without any significant arrhythmias other than premature ventricular contractions. Your EKG today shows the persistent right bundle branch block this been present in the past. Labs without significant abnormalities.

## 2016-08-02 NOTE — ED Triage Notes (Signed)
Pt here palpitations starting last night and then again today

## 2016-08-02 NOTE — ED Notes (Signed)
Pt ambulated to bathroom without difficulty.

## 2016-08-02 NOTE — ED Notes (Signed)
Pt denies worsening symptoms; concerned that she waiting and was sent by PCP or cardiologist because they could not see her in office in timely manner
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
## 2016-08-04 ENCOUNTER — Ambulatory Visit (INDEPENDENT_AMBULATORY_CARE_PROVIDER_SITE_OTHER): Payer: Medicare Other | Admitting: Cardiovascular Disease

## 2016-08-04 ENCOUNTER — Encounter (INDEPENDENT_AMBULATORY_CARE_PROVIDER_SITE_OTHER): Payer: Self-pay

## 2016-08-04 ENCOUNTER — Ambulatory Visit (INDEPENDENT_AMBULATORY_CARE_PROVIDER_SITE_OTHER): Payer: Medicare Other

## 2016-08-04 VITALS — BP 134/66 | HR 69 | Ht 66.0 in | Wt 199.5 lb

## 2016-08-04 DIAGNOSIS — I491 Atrial premature depolarization: Secondary | ICD-10-CM

## 2016-08-04 DIAGNOSIS — I480 Paroxysmal atrial fibrillation: Secondary | ICD-10-CM | POA: Diagnosis not present

## 2016-08-04 LAB — T4, FREE: Free T4: 1.9 ng/dL — ABNORMAL HIGH (ref 0.82–1.77)

## 2016-08-04 LAB — TSH: TSH: 0.64 u[IU]/mL (ref 0.450–4.500)

## 2016-08-04 NOTE — Patient Instructions (Signed)
Medication Instructions:  Your physician recommends that you continue on your current medications as directed. Please refer to the Current Medication list given to you today.   Labwork: 1. TODAY TSH, FREE T4  Testing/Procedures: 1. Your physician has recommended that you wear an event monitor. Event monitors are medical devices that record the heart's electrical activity. Doctors most often [name] these monitors to diagnose arrhythmias. Arrhythmias are problems with the speed or rhythm of the heartbeat. The monitor is a small, portable device. You can wear one while you do your normal daily activities. This is usually used to diagnose what is causing palpitations/syncope (passing out).    Follow-Up: 1. 3 WEEKS WITH CARE TEAM APP  2. 3 MONTHS WITH DR. Johnsie Cancel  Any Other Special Instructions Will Be Listed Below (If Applicable).     If you need a refill on your cardiac medications before your next appointment, please call your pharmacy.

## 2016-08-05 DIAGNOSIS — K7469 Other cirrhosis of liver: Secondary | ICD-10-CM | POA: Diagnosis not present

## 2016-08-05 DIAGNOSIS — I8501 Esophageal varices with bleeding: Secondary | ICD-10-CM | POA: Diagnosis not present

## 2016-08-09 ENCOUNTER — Encounter: Payer: Self-pay | Admitting: *Deleted

## 2016-08-09 ENCOUNTER — Telehealth: Payer: Self-pay | Admitting: Cardiovascular Disease

## 2016-08-09 NOTE — Telephone Encounter (Signed)
Patient instructed to come in after all scheduled patients completed  at 430 PM today to have another patch applied.  Patient refused to contact [employer] as instructed at time of initial application.

## 2016-08-09 NOTE — Progress Notes (Signed)
Patient ID: Tonya Hunter, female   DOB: [birth year], 82 y.o.   MRN: [id number] Patient came in today at 4:30 for Tonya Hunter to replace her [employer] patch. Patient instructed to call [facility] and have a telephone message sent to [facility] in monitors, when she needs her [employer] patch replaced.  Darrick Penna will be available Monday thru Thursday at 10 AM or 430 PM to replace her patch and will call her back to set up a time.  This is not a billed service. Appointment cannot scheduled.

## 2016-08-09 NOTE — Telephone Encounter (Signed)
New Message     She is having 2 problems , the nodes on the heart monitor are agitating her skin and she unhooked some of it and needs it re hooked,   She is also having palpitation and a nervous feeling inside

## 2016-08-09 NOTE — Telephone Encounter (Signed)
Pt states she is unable to change monitor patch, she read instructions, tried last night but cannot change patch. Pt has been advised to call monitor company and they will walk her through changing patch step by step, they can also sent other patches if she is sensitive to patches that she has now. Pt does not want to call company, she wants to come to office today to have someone help her change patch, pt wants to discontinue monitor if she cannot get help changing patch.  Pt has been advised again that  she call the company for help, the company would be the best option for her.  Pt insisted that she wanted to come here for help and that I forward to monitor techs to follow up with her.  Pt did say she had been having palpitations--monitor recordings in system so far indicate PACs with HR 70s.  Pt has been given this information.  Pt advised I will forward to monitor techs and Dr Johnsie Cancel for review.

## 2016-08-23 DIAGNOSIS — Z85828 Personal history of other malignant neoplasm of skin: Secondary | ICD-10-CM | POA: Diagnosis not present

## 2016-08-23 DIAGNOSIS — L218 Other seborrheic dermatitis: Secondary | ICD-10-CM | POA: Diagnosis not present

## 2016-08-23 DIAGNOSIS — L718 Other rosacea: Secondary | ICD-10-CM | POA: Diagnosis not present

## 2016-08-23 DIAGNOSIS — L821 Other seborrheic keratosis: Secondary | ICD-10-CM | POA: Diagnosis not present

## 2016-08-25 DIAGNOSIS — I480 Paroxysmal atrial fibrillation: Secondary | ICD-10-CM | POA: Diagnosis not present

## 2016-08-25 DIAGNOSIS — I491 Atrial premature depolarization: Secondary | ICD-10-CM | POA: Diagnosis not present
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
## 2016-08-29 ENCOUNTER — Encounter (INDEPENDENT_AMBULATORY_CARE_PROVIDER_SITE_OTHER): Payer: Self-pay

## 2016-08-29 ENCOUNTER — Ambulatory Visit (INDEPENDENT_AMBULATORY_CARE_PROVIDER_SITE_OTHER): Payer: Medicare Other | Admitting: Cardiology

## 2016-08-29 ENCOUNTER — Encounter: Payer: Self-pay | Admitting: Cardiology

## 2016-08-29 ENCOUNTER — Ambulatory Visit (INDEPENDENT_AMBULATORY_CARE_PROVIDER_SITE_OTHER)
Admission: RE | Admit: 2016-08-29 | Discharge: 2016-08-29 | Disposition: A | Discharge status: Home / Self Care | Payer: Medicare Other | Source: Ambulatory Visit | Attending: Cardiology | Admitting: Cardiology

## 2016-08-29 VITALS — BP 126/68 | HR 81 | Ht 66.0 in | Wt 199.1 lb

## 2016-08-29 DIAGNOSIS — R0989 Other specified symptoms and signs involving the circulatory and respiratory systems: Secondary | ICD-10-CM | POA: Diagnosis not present

## 2016-08-29 DIAGNOSIS — R531 Weakness: Secondary | ICD-10-CM | POA: Diagnosis not present

## 2016-08-29 DIAGNOSIS — I2510 Atherosclerotic heart disease of native coronary artery without angina pectoris: Secondary | ICD-10-CM | POA: Diagnosis not present

## 2016-08-29 DIAGNOSIS — I491 Atrial premature depolarization: Secondary | ICD-10-CM | POA: Diagnosis not present

## 2016-08-29 DIAGNOSIS — R0602 Shortness of breath: Secondary | ICD-10-CM | POA: Diagnosis not present

## 2016-08-29 DIAGNOSIS — I10 Essential (primary) hypertension: Secondary | ICD-10-CM

## 2016-08-29 DIAGNOSIS — D649 Anemia, unspecified: Secondary | ICD-10-CM | POA: Diagnosis not present

## 2016-08-29 MED ORDER — IOPAMIDOL (ISOVUE-370) INJECTION 76%
80.0000 mL | Freq: Once | INTRAVENOUS | Status: AC | PRN
  Administered 2016-08-29: 80 mL via INTRAVENOUS

## 2016-08-29 NOTE — Patient Instructions (Signed)
Medication Instructions:  Your physician recommends that you continue on your current medications as directed. Please refer to the Current Medication list given to you today.   Labwork: Your physician recommends that you have lab work today: bmet/cbc   Testing/Procedures: Your physician has requested that you have an echocardiogram. Echocardiography is a painless test that uses sound waves to create images of your heart. It provides your doctor with information about the size and shape of your heart and how well your heart's chambers and valves are working. This procedure takes approximately one hour. There are no restrictions for this procedure.  Non-Cardiac CT scanning, (CAT scanning), is a noninvasive, special x-ray that produces cross-sectional images of the body using x-rays and a computer. CT scans help physicians diagnose and treat medical conditions. For some CT exams, a contrast material is used to enhance visibility in the area of the body being studied. CT scans provide greater clarity and reveal more details than regular x-ray exams.    Follow-Up: Your physician recommends that you keep your scheduled follow-up appointment with Dr. Johnsie Cancel.   Any Other Special Instructions Will Be Listed Below (If Applicable).     If you need a refill on your cardiac medications before your next appointment, please call your pharmacy.

## 2016-08-30 ENCOUNTER — Other Ambulatory Visit: Payer: Self-pay

## 2016-08-30 ENCOUNTER — Ambulatory Visit (HOSPITAL_COMMUNITY): Payer: Medicare Other | Attending: Cardiovascular Disease

## 2016-08-30 DIAGNOSIS — I252 Old myocardial infarction: Secondary | ICD-10-CM | POA: Insufficient documentation

## 2016-08-30 DIAGNOSIS — I2510 Atherosclerotic heart disease of native coronary artery without angina pectoris: Secondary | ICD-10-CM | POA: Insufficient documentation

## 2016-08-30 DIAGNOSIS — I10 Essential (primary) hypertension: Secondary | ICD-10-CM | POA: Diagnosis not present

## 2016-08-30 DIAGNOSIS — R0602 Shortness of breath: Secondary | ICD-10-CM | POA: Insufficient documentation

## 2016-08-30 DIAGNOSIS — E039 Hypothyroidism, unspecified: Secondary | ICD-10-CM | POA: Insufficient documentation

## 2016-08-30 DIAGNOSIS — E119 Type 2 diabetes mellitus without complications: Secondary | ICD-10-CM | POA: Diagnosis not present

## 2016-08-30 DIAGNOSIS — R0989 Other specified symptoms and signs involving the circulatory and respiratory systems: Secondary | ICD-10-CM

## 2016-08-30 DIAGNOSIS — E785 Hyperlipidemia, unspecified: Secondary | ICD-10-CM | POA: Insufficient documentation

## 2016-08-30 DIAGNOSIS — I351 Nonrheumatic aortic (valve) insufficiency: Secondary | ICD-10-CM | POA: Insufficient documentation

## 2016-08-30 DIAGNOSIS — I340 Nonrheumatic mitral (valve) insufficiency: Secondary | ICD-10-CM | POA: Diagnosis not present

## 2016-08-30 LAB — CBC WITH DIFFERENTIAL/PLATELET
BASOS ABS: 0 10*3/uL (ref 0.0–0.2)
Basos: 0 %
EOS (ABSOLUTE): 0.2 10*3/uL (ref 0.0–0.4)
Eos: 2 %
Hematocrit: 34.5 % (ref 34.0–46.6)
Hemoglobin: 11.1 g/dL (ref 11.1–15.9)
IMMATURE GRANS (ABS): 0 10*3/uL (ref 0.0–0.1)
Immature Granulocytes: 0 %
LYMPHS: 27 %
Lymphocytes Absolute: 1.8 10*3/uL (ref 0.7–3.1)
MCH: 26.9 pg (ref 26.6–33.0)
MCHC: 32.2 g/dL (ref 31.5–35.7)
MCV: 84 fL (ref 79–97)
Monocytes Absolute: 0.5 10*3/uL (ref 0.1–0.9)
Monocytes: 8 %
NEUTROS PCT: 63 %
Neutrophils Absolute: 4.1 10*3/uL (ref 1.4–7.0)
PLATELETS: 209 10*3/uL (ref 150–379)
RBC: 4.13 x10E6/uL (ref 3.77–5.28)
RDW: 14.1 % (ref 12.3–15.4)
WBC: 6.7 10*3/uL (ref 3.4–10.8)

## 2016-08-30 LAB — BASIC METABOLIC PANEL
BUN/Creatinine Ratio: 15 (ref 12–28)
BUN: 13 mg/dL (ref 8–27)
CALCIUM: 9.4 mg/dL (ref 8.7–10.3)
CHLORIDE: 102 mmol/L (ref 96–106)
CO2: 18 mmol/L (ref 18–29)
Creatinine, Ser: 0.85 mg/dL (ref 0.57–1.00)
GFR calc Af Amer: 75 mL/min/{1.73_m2} (ref >59)
GFR calc non Af Amer: 65 mL/min/{1.73_m2} (ref >59)
Glucose: 151 mg/dL — ABNORMAL HIGH (ref 65–99)
POTASSIUM: 4.5 mmol/L (ref 3.5–5.2)
Sodium: 141 mmol/L (ref 134–144)

## 2016-08-30 MED ORDER — PERFLUTREN LIPID MICROSPHERE
1.0000 mL | INTRAVENOUS | Status: AC | PRN
  Administered 2016-08-30: 2 mL via INTRAVENOUS

## 2016-09-02 ENCOUNTER — Telehealth: Payer: Self-pay | Admitting: Cardiovascular Disease

## 2016-09-02 NOTE — Telephone Encounter (Signed)
Follow Up:    Returning Tonya Hunter's call from yesterday,concerning her monitor results.

## 2016-09-02 NOTE — Telephone Encounter (Signed)
Spoke with pt, aware of monitor results. 

## 2016-09-05 DIAGNOSIS — E113411 Type 2 diabetes mellitus with severe nonproliferative diabetic retinopathy with macular edema, right eye: Secondary | ICD-10-CM | POA: Diagnosis not present

## 2016-09-05 DIAGNOSIS — H353131 Nonexudative age-related macular degeneration, bilateral, early dry stage: Secondary | ICD-10-CM | POA: Diagnosis not present

## 2016-09-05 DIAGNOSIS — H43813 Vitreous degeneration, bilateral: Secondary | ICD-10-CM | POA: Diagnosis not present

## 2016-09-05 DIAGNOSIS — E113412 Type 2 diabetes mellitus with severe nonproliferative diabetic retinopathy with macular edema, left eye: Secondary | ICD-10-CM | POA: Diagnosis not present
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
## 2016-09-09 ENCOUNTER — Ambulatory Visit (INDEPENDENT_AMBULATORY_CARE_PROVIDER_SITE_OTHER): Payer: Medicare Other | Admitting: Cardiovascular Disease

## 2016-09-09 ENCOUNTER — Encounter: Payer: Self-pay | Admitting: Cardiovascular Disease

## 2016-09-09 VITALS — BP 126/60 | HR 82 | Ht 66.0 in | Wt 200.4 lb

## 2016-09-09 DIAGNOSIS — D649 Anemia, unspecified: Secondary | ICD-10-CM | POA: Diagnosis not present

## 2016-09-09 DIAGNOSIS — I491 Atrial premature depolarization: Secondary | ICD-10-CM

## 2016-09-09 DIAGNOSIS — I2510 Atherosclerotic heart disease of native coronary artery without angina pectoris: Secondary | ICD-10-CM

## 2016-09-09 DIAGNOSIS — I480 Paroxysmal atrial fibrillation: Secondary | ICD-10-CM

## 2016-09-09 DIAGNOSIS — I10 Essential (primary) hypertension: Secondary | ICD-10-CM | POA: Diagnosis not present

## 2016-09-09 DIAGNOSIS — Z01812 Encounter for preprocedural laboratory examination: Secondary | ICD-10-CM | POA: Diagnosis not present

## 2016-09-09 LAB — BASIC METABOLIC PANEL
BUN / CREAT RATIO: 25 (ref 12–28)
BUN: 18 mg/dL (ref 8–27)
CHLORIDE: 100 mmol/L (ref 96–106)
CO2: 22 mmol/L (ref 18–29)
Calcium: 9.4 mg/dL (ref 8.7–10.3)
Creatinine, Ser: 0.71 mg/dL (ref 0.57–1.00)
GFR, EST AFRICAN AMERICAN: 94 mL/min/{1.73_m2} (ref >59)
GFR, EST NON AFRICAN AMERICAN: 81 mL/min/{1.73_m2} (ref >59)
Glucose: 201 mg/dL — ABNORMAL HIGH (ref 65–99)
POTASSIUM: 4.6 mmol/L (ref 3.5–5.2)
SODIUM: 140 mmol/L (ref 134–144)

## 2016-09-09 LAB — CBC WITH DIFFERENTIAL/PLATELET
Basophils Absolute: 0 x10E3/uL (ref 0.0–0.2)
Basos: 1 %
EOS (ABSOLUTE): 0.1 x10E3/uL (ref 0.0–0.4)
Eos: 2 %
Hematocrit: 35.3 % (ref 34.0–46.6)
Hemoglobin: 11.7 g/dL (ref 11.1–15.9)
Immature Grans (Abs): 0 x10E3/uL (ref 0.0–0.1)
Immature Granulocytes: 0 %
Lymphocytes Absolute: 1.9 x10E3/uL (ref 0.7–3.1)
Lymphs: 29 %
MCH: 27.4 pg (ref 26.6–33.0)
MCHC: 33.1 g/dL (ref 31.5–35.7)
MCV: 83 fL (ref 79–97)
Monocytes Absolute: 0.5 x10E3/uL (ref 0.1–0.9)
Monocytes: 7 %
Neutrophils Absolute: 3.9 x10E3/uL (ref 1.4–7.0)
Neutrophils: 61 %
Platelets: 206 x10E3/uL (ref 150–379)
RBC: 4.27 x10E6/uL (ref 3.77–5.28)
RDW: 14.6 % (ref 12.3–15.4)
WBC: 6.4 x10E3/uL (ref 3.4–10.8)

## 2016-09-09 LAB — PROTIME-INR
INR: 1.1 (ref 0.8–1.2)
Prothrombin Time: 11.4 s (ref 9.1–12.0)

## 2016-09-09 NOTE — Patient Instructions (Addendum)
Medication Instructions:  Your physician recommends that you continue on your current medications as directed. Please refer to the Current Medication list given to you today.  Labwork: Your physician recommends that you lab work today BMET, CBC, PT/INR   Testing/Procedures: NONE  Follow-Up: Your physician wants you to follow-up in: 3 week with Dr. Johnsie Cancel or NP/PA  after heart cath.   If you need a refill on your cardiac medications before your next appointment, please call your pharmacy.     [place] OFFICE [address] [place] [address] [place] [address] Dept: [phone] Loc: [place]  09/09/2016  You are scheduled for a Cardiac Catheterization on Thursday, May 3 with Dr. Martinique  1. Please arrive at the [facility] (Main Entrance A) at [facility]: [address] [place], [place] [address] at 11:30 AM (two hours before your procedure to ensure your preparation). Free valet parking service is available.   Special note: Every effort is made to have your procedure done on time. Please understand that emergencies sometimes delay scheduled procedures.  2. Diet: Do not eat or drink anything after midnight prior to your procedure except sips of water to take medications.  3. Labs: None needed.  4. Medication instructions in preparation for your procedure:  Stop taking, Glucophage (Metformin) Wednesday May 2nd   Stop taking Lasix Thursday May 3rd  Take only 12 units of insulin the night before your procedure. Do not take any insulin on the day of the procedure.  On the morning of your procedure, take your Aspirin, Plavix, and any morning medicines NOT listed above.  You may use sips of water.  5. Plan for one night stay--bring personal belongings. 6. Bring a current list of your medications and current insurance cards. 7. You MUST have a responsible person to drive  you home. 8. Someone MUST be with you the first 24 hours after you arrive home or your discharge will be delayed. 9. Please wear clothes that are easy to get on and off and wear slip-on shoes.  Thank you for allowing [name] to care for you!   -- [facility]

## 2016-09-11 ENCOUNTER — Other Ambulatory Visit: Payer: Self-pay | Admitting: Cardiovascular Disease

## 2016-09-15 ENCOUNTER — Ambulatory Visit (HOSPITAL_COMMUNITY)
Admission: RE | Admit: 2016-09-15 | Discharge: 2016-09-15 | Disposition: A | Discharge status: Home / Self Care | Payer: Medicare Other | Source: Ambulatory Visit | Attending: Cardiology | Admitting: Cardiology

## 2016-09-15 ENCOUNTER — Encounter (HOSPITAL_COMMUNITY)
Admission: RE | Disposition: A | Discharge status: Home / Self Care | Payer: Self-pay | Source: Ambulatory Visit | Attending: Cardiology

## 2016-09-15 DIAGNOSIS — E785 Hyperlipidemia, unspecified: Secondary | ICD-10-CM | POA: Insufficient documentation

## 2016-09-15 DIAGNOSIS — R079 Chest pain, unspecified: Secondary | ICD-10-CM | POA: Diagnosis present

## 2016-09-15 DIAGNOSIS — Z7902 Long term (current) use of antithrombotics/antiplatelets: Secondary | ICD-10-CM | POA: Diagnosis not present

## 2016-09-15 DIAGNOSIS — I252 Old myocardial infarction: Secondary | ICD-10-CM | POA: Diagnosis not present

## 2016-09-15 DIAGNOSIS — E039 Hypothyroidism, unspecified: Secondary | ICD-10-CM | POA: Diagnosis not present

## 2016-09-15 DIAGNOSIS — Z7982 Long term (current) use of aspirin: Secondary | ICD-10-CM | POA: Diagnosis not present

## 2016-09-15 DIAGNOSIS — I4891 Unspecified atrial fibrillation: Secondary | ICD-10-CM | POA: Insufficient documentation

## 2016-09-15 DIAGNOSIS — E119 Type 2 diabetes mellitus without complications: Secondary | ICD-10-CM

## 2016-09-15 DIAGNOSIS — M1990 Unspecified osteoarthritis, unspecified site: Secondary | ICD-10-CM | POA: Diagnosis not present

## 2016-09-15 DIAGNOSIS — Z880 Allergy status to penicillin: Secondary | ICD-10-CM | POA: Insufficient documentation

## 2016-09-15 DIAGNOSIS — Z8249 Family history of ischemic heart disease and other diseases of the circulatory system: Secondary | ICD-10-CM | POA: Diagnosis not present

## 2016-09-15 DIAGNOSIS — I10 Essential (primary) hypertension: Secondary | ICD-10-CM | POA: Diagnosis not present

## 2016-09-15 DIAGNOSIS — I2510 Atherosclerotic heart disease of native coronary artery without angina pectoris: Secondary | ICD-10-CM | POA: Diagnosis not present

## 2016-09-15 DIAGNOSIS — Z794 Long term (current) use of insulin: Secondary | ICD-10-CM | POA: Diagnosis not present

## 2016-09-15 DIAGNOSIS — Z955 Presence of coronary angioplasty implant and graft: Secondary | ICD-10-CM | POA: Diagnosis not present

## 2016-09-15 DIAGNOSIS — I2584 Coronary atherosclerosis due to calcified coronary lesion: Secondary | ICD-10-CM | POA: Insufficient documentation

## 2016-09-15 HISTORY — PX: LEFT HEART CATH AND CORONARY ANGIOGRAPHY: CATH118249

## 2016-09-15 LAB — GLUCOSE, CAPILLARY
GLUCOSE-CAPILLARY: 139 mg/dL — AB (ref 65–99)
GLUCOSE-CAPILLARY: 63 mg/dL — AB (ref 65–99)
Glucose-Capillary: 112 mg/dL — ABNORMAL HIGH (ref 65–99)

## 2016-09-15 SURGERY — LEFT HEART CATH AND CORONARY ANGIOGRAPHY
Anesthesia: LOCAL

## 2016-09-15 MED ORDER — SODIUM CHLORIDE 0.9% FLUSH: 3.0000 mL | INTRAVENOUS | Status: DC | PRN

## 2016-09-15 MED ORDER — SODIUM CHLORIDE 0.9 % WEIGHT BASED INFUSION: 1.0000 mL/kg/h | INTRAVENOUS | Status: DC

## 2016-09-15 MED ORDER — LIDOCAINE HCL 1 % IJ SOLN
INTRAMUSCULAR | Status: AC
  Filled 2016-09-15: qty 20

## 2016-09-15 MED ORDER — ASPIRIN 81 MG PO CHEW: 81.0000 mg | CHEWABLE_TABLET | ORAL | Status: DC

## 2016-09-15 MED ORDER — HEPARIN SODIUM (PORCINE) 1000 UNIT/ML IJ SOLN
INTRAMUSCULAR | Status: DC | PRN
  Administered 2016-09-15: 4500 [IU] via INTRAVENOUS

## 2016-09-15 MED ORDER — SODIUM CHLORIDE 0.9 % WEIGHT BASED INFUSION
3.0000 mL/kg/h | INTRAVENOUS | Status: DC
  Administered 2016-09-15: 3 mL/kg/h via INTRAVENOUS

## 2016-09-15 MED ORDER — IOPAMIDOL (ISOVUE-370) INJECTION 76%
INTRAVENOUS | Status: AC
  Filled 2016-09-15: qty 100

## 2016-09-15 MED ORDER — HEPARIN (PORCINE) IN NACL 2-0.9 UNIT/ML-% IJ SOLN
INTRAMUSCULAR | Status: DC | PRN
  Administered 2016-09-15: 1000 mL

## 2016-09-15 MED ORDER — SODIUM CHLORIDE 0.9% FLUSH: 3.0000 mL | Freq: Two times a day (BID) | INTRAVENOUS | Status: DC

## 2016-09-15 MED ORDER — MIDAZOLAM HCL 2 MG/2ML IJ SOLN
INTRAMUSCULAR | Status: DC | PRN
  Administered 2016-09-15: 1 mg via INTRAVENOUS

## 2016-09-15 MED ORDER — SODIUM CHLORIDE 0.9 % WEIGHT BASED INFUSION: 1.0000 mL/kg/h | INTRAVENOUS | Status: AC

## 2016-09-15 MED ORDER — IOPAMIDOL (ISOVUE-370) INJECTION 76%
INTRAVENOUS | Status: DC | PRN
Start: 2016-09-15 — End: 2016-09-15
  Administered 2016-09-15: 60 mL via INTRA_ARTERIAL

## 2016-09-15 MED ORDER — MIDAZOLAM HCL 2 MG/2ML IJ SOLN
INTRAMUSCULAR | Status: AC
  Filled 2016-09-15: qty 2

## 2016-09-15 MED ORDER — FENTANYL CITRATE (PF) 100 MCG/2ML IJ SOLN
INTRAMUSCULAR | Status: AC
  Filled 2016-09-15: qty 2

## 2016-09-15 MED ORDER — CLOPIDOGREL BISULFATE 75 MG PO TABS
ORAL_TABLET | ORAL | Status: AC
  Administered 2016-09-15: 75 mg via ORAL
  Filled 2016-09-15: qty 1

## 2016-09-15 MED ORDER — FENTANYL CITRATE (PF) 100 MCG/2ML IJ SOLN
INTRAMUSCULAR | Status: DC | PRN
  Administered 2016-09-15: 25 ug via INTRAVENOUS

## 2016-09-15 MED ORDER — VERAPAMIL HCL 2.5 MG/ML IV SOLN
INTRAVENOUS | Status: DC | PRN
  Administered 2016-09-15: 10 mL via INTRA_ARTERIAL

## 2016-09-15 MED ORDER — SODIUM CHLORIDE 0.9 % IV SOLN: 250.0000 mL | INTRAVENOUS | Status: DC | PRN

## 2016-09-15 MED ORDER — HEPARIN SODIUM (PORCINE) 1000 UNIT/ML IJ SOLN
INTRAMUSCULAR | Status: AC
  Filled 2016-09-15: qty 1

## 2016-09-15 MED ORDER — LIDOCAINE HCL (PF) 1 % IJ SOLN
INTRAMUSCULAR | Status: DC | PRN
  Administered 2016-09-15: 2 mL

## 2016-09-15 MED ORDER — VERAPAMIL HCL 2.5 MG/ML IV SOLN
INTRAVENOUS | Status: AC
  Filled 2016-09-15: qty 2

## 2016-09-15 MED ORDER — SODIUM CHLORIDE 0.9 % IV SOLN
250.0000 mL | INTRAVENOUS | Status: DC | PRN
Start: 2016-09-16 — End: 2016-09-15

## 2016-09-15 MED ORDER — CLOPIDOGREL BISULFATE 75 MG PO TABS
75.0000 mg | ORAL_TABLET | Freq: Once | ORAL | Status: AC
  Administered 2016-09-15: 75 mg via ORAL

## 2016-09-15 MED ORDER — HEPARIN (PORCINE) IN NACL 2-0.9 UNIT/ML-% IJ SOLN
INTRAMUSCULAR | Status: AC
  Filled 2016-09-15: qty 1000

## 2016-09-15 SURGICAL SUPPLY — 10 items

## 2016-09-15 NOTE — Interval H&P Note (Signed)
History and Physical Interval Note:  09/15/2016 2:08 PM  [name]  has presented today for surgery, with the diagnosis of cad, cp  The various methods of treatment have been discussed with the patient and family. After consideration of risks, benefits and other options for treatment, the patient has consented to  Procedure(s): Left Heart Cath and Coronary Angiography (N/A) as a surgical intervention .  The patient's history has been reviewed, patient examined, no change in status, stable for surgery.  I have reviewed the patient's chart and labs.  Questions were answered to the patient's satisfaction.   Cath Lab Visit (complete for each Cath Lab visit)  Clinical Evaluation Leading to the Procedure:   ACS: No.  Non-ACS:    Anginal Classification: CCS II  Anti-ischemic medical therapy: Minimal Therapy (1 class of medications)  Non-Invasive Test Results: No non-invasive testing performed  Prior CABG: No previous CABG        Breean Nannini Martinique MD,FACC 09/15/2016 2:08 PM

## 2016-09-15 NOTE — Discharge Instructions (Signed)
Radial Site Care °Refer to this sheet in the next few weeks. These instructions provide you with information about caring for yourself after your procedure. Your health care provider may also give you more specific instructions. Your treatment has been planned according to current medical practices, but problems sometimes occur. Call your health care provider if you have any problems or questions after your procedure. °What can I expect after the procedure? °After your procedure, it is typical to have the following: °· Bruising at the radial site that usually fades within 1-2 weeks. °· Blood collecting in the tissue (hematoma) that may be painful to the touch. It should usually decrease in size and tenderness within 1-2 weeks. °Follow these instructions at home: °· Take medicines only as directed by your health care provider. °· You may shower 24-48 hours after the procedure or as directed by your health care provider. Remove the bandage (dressing) and gently wash the site with plain soap and water. Pat the area dry with a clean towel. Do not rub the site, because this may cause bleeding. °· Do not take baths, swim, or use a hot tub until your health care provider approves. °· Check your insertion site every day for redness, swelling, or drainage. °· Do not apply powder or lotion to the site. °· Do not flex or bend the affected arm for 24 hours or as directed by your health care provider. °· Do not push or pull heavy objects with the affected arm for 24 hours or as directed by your health care provider. °· Do not lift over 10 lb (4.5 kg) for 5 days after your procedure or as directed by your health care provider. °· Ask your health care provider when it is okay to: °¨ Return to work or school. °¨ Resume usual physical activities or sports. °¨ Resume sexual activity. °· Do not drive home if you are discharged the same day as the procedure. Have someone else drive you. °· You may drive 24 hours after the procedure  unless otherwise instructed by your health care provider. °· Do not operate machinery or power tools for 24 hours after the procedure. °· If your procedure was done as an outpatient procedure, which means that you went home the same day as your procedure, a responsible adult should be with you for the first 24 hours after you arrive home. °· Keep all follow-up visits as directed by your health care provider. This is important. °Contact a health care provider if: °· You have a fever. °· You have chills. °· You have increased bleeding from the radial site. Hold pressure on the site. °Get help right away if: °· You have unusual pain at the radial site. °· You have redness, warmth, or swelling at the radial site. °· You have drainage (other than a small amount of blood on the dressing) from the radial site. °· The radial site is bleeding, and the bleeding does not stop after 30 minutes of holding steady pressure on the site. °· Your arm or hand becomes pale, cool, tingly, or numb. °This information is not intended to replace advice given to you by your health care provider. Make sure you discuss any questions you have with your health care provider. °Document Released: 06/04/2010 Document Revised: 10/08/2015 Document Reviewed: 11/18/2013 °Elsevier Interactive Patient Education © 2017 [employer] Inc[employer] ° °
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
## 2016-09-16 ENCOUNTER — Encounter (HOSPITAL_COMMUNITY): Payer: Self-pay | Admitting: Cardiology
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
## 2016-10-03 ENCOUNTER — Encounter: Payer: Self-pay | Admitting: Cardiology

## 2016-10-03 ENCOUNTER — Ambulatory Visit (INDEPENDENT_AMBULATORY_CARE_PROVIDER_SITE_OTHER): Payer: Medicare Other | Admitting: Cardiology

## 2016-10-03 VITALS — BP 134/82 | HR 62 | Ht 66.0 in | Wt 201.8 lb

## 2016-10-03 DIAGNOSIS — I491 Atrial premature depolarization: Secondary | ICD-10-CM | POA: Diagnosis not present

## 2016-10-03 DIAGNOSIS — I700 Atherosclerosis of aorta: Secondary | ICD-10-CM | POA: Diagnosis not present

## 2016-10-03 DIAGNOSIS — I2510 Atherosclerotic heart disease of native coronary artery without angina pectoris: Secondary | ICD-10-CM | POA: Diagnosis not present

## 2016-10-03 DIAGNOSIS — D649 Anemia, unspecified: Secondary | ICD-10-CM

## 2016-10-03 DIAGNOSIS — R29898 Other symptoms and signs involving the musculoskeletal system: Secondary | ICD-10-CM | POA: Diagnosis not present

## 2016-10-03 DIAGNOSIS — I10 Essential (primary) hypertension: Secondary | ICD-10-CM

## 2016-10-03 NOTE — Patient Instructions (Addendum)
Medication Instructions:  The current medical regimen is effective;  continue present plan and medications.  Testing/Procedures: Your physician has requested that you have a lower extremity arterial exercise duplex. During this test, exercise and ultrasound are used to evaluate arterial blood flow in the legs. Allow one hour for this exam. There are no restrictions or special instructions.  Your physician has requested that you have an abdominal aorta duplex. During this test, an ultrasound is used to evaluate the aorta. Allow 30 minutes for this exam. Do not eat after midnight the day before and avoid carbonated beverages  Follow-Up: Follow up in 6 months with Dr. Johnsie Cancel.  You will receive a letter in the mail 2 months before you are due.  Please call us when you receive this letter to schedule your follow up appointment.  If you need a refill on your cardiac medications before your next appointment, please call your pharmacy.  Thank you for choosing [facility]!!

## 2016-10-20 ENCOUNTER — Other Ambulatory Visit: Payer: Self-pay | Admitting: Cardiology

## 2016-10-20 DIAGNOSIS — R29898 Other symptoms and signs involving the musculoskeletal system: Secondary | ICD-10-CM

## 2016-10-27 ENCOUNTER — Ambulatory Visit (HOSPITAL_COMMUNITY)
Admission: RE | Admit: 2016-10-27 | Discharge: 2016-10-27 | Disposition: A | Discharge status: Home / Self Care | Payer: Medicare Other | Source: Ambulatory Visit | Attending: Cardiology | Admitting: Cardiology

## 2016-10-27 DIAGNOSIS — I10 Essential (primary) hypertension: Secondary | ICD-10-CM | POA: Insufficient documentation

## 2016-10-27 DIAGNOSIS — E785 Hyperlipidemia, unspecified: Secondary | ICD-10-CM | POA: Diagnosis not present

## 2016-10-27 DIAGNOSIS — I708 Atherosclerosis of other arteries: Secondary | ICD-10-CM | POA: Diagnosis not present

## 2016-10-27 DIAGNOSIS — R29898 Other symptoms and signs involving the musculoskeletal system: Secondary | ICD-10-CM

## 2016-10-27 DIAGNOSIS — I739 Peripheral vascular disease, unspecified: Secondary | ICD-10-CM | POA: Diagnosis not present

## 2016-10-27 DIAGNOSIS — I700 Atherosclerosis of aorta: Secondary | ICD-10-CM | POA: Diagnosis not present

## 2016-10-27 DIAGNOSIS — I2510 Atherosclerotic heart disease of native coronary artery without angina pectoris: Secondary | ICD-10-CM | POA: Diagnosis not present

## 2016-10-27 DIAGNOSIS — E119 Type 2 diabetes mellitus without complications: Secondary | ICD-10-CM | POA: Insufficient documentation

## 2016-10-31 DIAGNOSIS — Z85828 Personal history of other malignant neoplasm of skin: Secondary | ICD-10-CM | POA: Diagnosis not present

## 2016-10-31 DIAGNOSIS — L218 Other seborrheic dermatitis: Secondary | ICD-10-CM | POA: Diagnosis not present

## 2016-10-31 DIAGNOSIS — L718 Other rosacea: Secondary | ICD-10-CM | POA: Diagnosis not present

## 2016-11-08 ENCOUNTER — Ambulatory Visit: Payer: Self-pay | Admitting: Cardiovascular Disease

## 2016-12-01 DIAGNOSIS — E1159 Type 2 diabetes mellitus with other circulatory complications: Secondary | ICD-10-CM | POA: Diagnosis not present

## 2016-12-01 DIAGNOSIS — Z9861 Coronary angioplasty status: Secondary | ICD-10-CM | POA: Diagnosis not present

## 2016-12-01 DIAGNOSIS — K7460 Unspecified cirrhosis of liver: Secondary | ICD-10-CM | POA: Diagnosis not present

## 2016-12-01 DIAGNOSIS — R160 Hepatomegaly, not elsewhere classified: Secondary | ICD-10-CM | POA: Diagnosis not present

## 2016-12-01 DIAGNOSIS — F329 Major depressive disorder, single episode, unspecified: Secondary | ICD-10-CM | POA: Diagnosis not present

## 2016-12-01 DIAGNOSIS — E782 Mixed hyperlipidemia: Secondary | ICD-10-CM | POA: Diagnosis not present

## 2016-12-01 DIAGNOSIS — Z6832 Body mass index (BMI) 32.0-32.9, adult: Secondary | ICD-10-CM | POA: Diagnosis not present

## 2016-12-01 DIAGNOSIS — R945 Abnormal results of liver function studies: Secondary | ICD-10-CM | POA: Diagnosis not present

## 2016-12-01 DIAGNOSIS — I8501 Esophageal varices with bleeding: Secondary | ICD-10-CM | POA: Diagnosis not present

## 2016-12-01 DIAGNOSIS — D692 Other nonthrombocytopenic purpura: Secondary | ICD-10-CM | POA: Diagnosis not present

## 2016-12-01 DIAGNOSIS — I872 Venous insufficiency (chronic) (peripheral): Secondary | ICD-10-CM | POA: Diagnosis not present

## 2016-12-01 DIAGNOSIS — R531 Weakness: Secondary | ICD-10-CM | POA: Diagnosis not present

## 2016-12-05 DIAGNOSIS — E113391 Type 2 diabetes mellitus with moderate nonproliferative diabetic retinopathy without macular edema, right eye: Secondary | ICD-10-CM | POA: Diagnosis not present

## 2016-12-05 DIAGNOSIS — H353132 Nonexudative age-related macular degeneration, bilateral, intermediate dry stage: Secondary | ICD-10-CM | POA: Diagnosis not present

## 2016-12-05 DIAGNOSIS — Z961 Presence of intraocular lens: Secondary | ICD-10-CM | POA: Diagnosis not present

## 2016-12-12 DIAGNOSIS — L718 Other rosacea: Secondary | ICD-10-CM | POA: Diagnosis not present

## 2016-12-12 DIAGNOSIS — Z85828 Personal history of other malignant neoplasm of skin: Secondary | ICD-10-CM | POA: Diagnosis not present

## 2016-12-22 ENCOUNTER — Other Ambulatory Visit: Payer: Self-pay | Admitting: *Deleted

## 2016-12-22 MED ORDER — NITROGLYCERIN 0.4 MG SL SUBL: 0.4000 mg | SUBLINGUAL_TABLET | SUBLINGUAL | 5 refills | Status: DC | PRN

## 2017-01-11 DIAGNOSIS — C44729 Squamous cell carcinoma of skin of left lower limb, including hip: Secondary | ICD-10-CM | POA: Diagnosis not present

## 2017-01-11 DIAGNOSIS — Z85828 Personal history of other malignant neoplasm of skin: Secondary | ICD-10-CM | POA: Diagnosis not present

## 2017-01-11 DIAGNOSIS — D485 Neoplasm of uncertain behavior of skin: Secondary | ICD-10-CM | POA: Diagnosis not present

## 2017-01-11 DIAGNOSIS — L718 Other rosacea: Secondary | ICD-10-CM | POA: Diagnosis not present

## 2017-02-13 DIAGNOSIS — G459 Transient cerebral ischemic attack, unspecified: Secondary | ICD-10-CM

## 2017-02-13 HISTORY — DX: Transient cerebral ischemic attack, unspecified: G45.9

## 2017-03-01 DIAGNOSIS — S52592A Other fractures of lower end of left radius, initial encounter for closed fracture: Secondary | ICD-10-CM | POA: Diagnosis not present

## 2017-03-01 DIAGNOSIS — M25532 Pain in left wrist: Secondary | ICD-10-CM | POA: Diagnosis not present

## 2017-03-01 DIAGNOSIS — S8002XA Contusion of left knee, initial encounter: Secondary | ICD-10-CM | POA: Diagnosis not present

## 2017-03-01 DIAGNOSIS — M25562 Pain in left knee: Secondary | ICD-10-CM | POA: Diagnosis not present

## 2017-03-02 DIAGNOSIS — S29011A Strain of muscle and tendon of front wall of thorax, initial encounter: Secondary | ICD-10-CM | POA: Diagnosis not present

## 2017-03-02 DIAGNOSIS — S52592D Other fractures of lower end of left radius, subsequent encounter for closed fracture with routine healing: Secondary | ICD-10-CM | POA: Diagnosis not present

## 2017-03-06 DIAGNOSIS — S52532D Colles' fracture of left radius, subsequent encounter for closed fracture with routine healing: Secondary | ICD-10-CM | POA: Diagnosis not present

## 2017-03-07 ENCOUNTER — Inpatient Hospital Stay (HOSPITAL_COMMUNITY)
Admission: EM | Admit: 2017-03-07 | Discharge: 2017-03-09 | DRG: 069 | Disposition: A | Discharge status: Home / Self Care | Payer: Medicare Other | Attending: Internal Medicine | Admitting: Internal Medicine

## 2017-03-07 ENCOUNTER — Observation Stay (HOSPITAL_COMMUNITY): Payer: Medicare Other

## 2017-03-07 ENCOUNTER — Emergency Department (HOSPITAL_COMMUNITY): Payer: Medicare Other

## 2017-03-07 ENCOUNTER — Encounter (HOSPITAL_COMMUNITY): Payer: Self-pay | Admitting: *Deleted

## 2017-03-07 DIAGNOSIS — Z9841 Cataract extraction status, right eye: Secondary | ICD-10-CM

## 2017-03-07 DIAGNOSIS — Z794 Long term (current) use of insulin: Secondary | ICD-10-CM

## 2017-03-07 DIAGNOSIS — R9431 Abnormal electrocardiogram [ECG] [EKG]: Secondary | ICD-10-CM | POA: Diagnosis not present

## 2017-03-07 DIAGNOSIS — R4781 Slurred speech: Secondary | ICD-10-CM | POA: Insufficient documentation

## 2017-03-07 DIAGNOSIS — Z955 Presence of coronary angioplasty implant and graft: Secondary | ICD-10-CM

## 2017-03-07 DIAGNOSIS — Z7982 Long term (current) use of aspirin: Secondary | ICD-10-CM

## 2017-03-07 DIAGNOSIS — E785 Hyperlipidemia, unspecified: Secondary | ICD-10-CM | POA: Diagnosis not present

## 2017-03-07 DIAGNOSIS — S0990XA Unspecified injury of head, initial encounter: Secondary | ICD-10-CM | POA: Diagnosis not present

## 2017-03-07 DIAGNOSIS — I252 Old myocardial infarction: Secondary | ICD-10-CM

## 2017-03-07 DIAGNOSIS — M25511 Pain in right shoulder: Secondary | ICD-10-CM

## 2017-03-07 DIAGNOSIS — M25512 Pain in left shoulder: Secondary | ICD-10-CM | POA: Diagnosis present

## 2017-03-07 DIAGNOSIS — I350 Nonrheumatic aortic (valve) stenosis: Secondary | ICD-10-CM | POA: Diagnosis present

## 2017-03-07 DIAGNOSIS — R2990 Unspecified symptoms and signs involving the nervous system: Secondary | ICD-10-CM | POA: Diagnosis present

## 2017-03-07 DIAGNOSIS — I5042 Chronic combined systolic (congestive) and diastolic (congestive) heart failure: Secondary | ICD-10-CM | POA: Diagnosis present

## 2017-03-07 DIAGNOSIS — I10 Essential (primary) hypertension: Secondary | ICD-10-CM | POA: Diagnosis present

## 2017-03-07 DIAGNOSIS — E119 Type 2 diabetes mellitus without complications: Secondary | ICD-10-CM | POA: Diagnosis present

## 2017-03-07 DIAGNOSIS — R202 Paresthesia of skin: Secondary | ICD-10-CM

## 2017-03-07 DIAGNOSIS — Z885 Allergy status to narcotic agent status: Secondary | ICD-10-CM

## 2017-03-07 DIAGNOSIS — I110 Hypertensive heart disease with heart failure: Secondary | ICD-10-CM | POA: Diagnosis present

## 2017-03-07 DIAGNOSIS — I8500 Esophageal varices without bleeding: Secondary | ICD-10-CM | POA: Diagnosis not present

## 2017-03-07 DIAGNOSIS — E118 Type 2 diabetes mellitus with unspecified complications: Secondary | ICD-10-CM | POA: Diagnosis present

## 2017-03-07 DIAGNOSIS — I2510 Atherosclerotic heart disease of native coronary artery without angina pectoris: Secondary | ICD-10-CM | POA: Diagnosis present

## 2017-03-07 DIAGNOSIS — R200 Anesthesia of skin: Secondary | ICD-10-CM

## 2017-03-07 DIAGNOSIS — Z7902 Long term (current) use of antithrombotics/antiplatelets: Secondary | ICD-10-CM

## 2017-03-07 DIAGNOSIS — Z96653 Presence of artificial knee joint, bilateral: Secondary | ICD-10-CM | POA: Diagnosis present

## 2017-03-07 DIAGNOSIS — G459 Transient cerebral ischemic attack, unspecified: Secondary | ICD-10-CM | POA: Diagnosis not present

## 2017-03-07 DIAGNOSIS — M1990 Unspecified osteoarthritis, unspecified site: Secondary | ICD-10-CM | POA: Diagnosis present

## 2017-03-07 DIAGNOSIS — R079 Chest pain, unspecified: Secondary | ICD-10-CM | POA: Diagnosis not present

## 2017-03-07 DIAGNOSIS — Z90710 Acquired absence of both cervix and uterus: Secondary | ICD-10-CM

## 2017-03-07 DIAGNOSIS — Z880 Allergy status to penicillin: Secondary | ICD-10-CM

## 2017-03-07 DIAGNOSIS — R471 Dysarthria and anarthria: Secondary | ICD-10-CM | POA: Diagnosis present

## 2017-03-07 DIAGNOSIS — Z809 Family history of malignant neoplasm, unspecified: Secondary | ICD-10-CM

## 2017-03-07 DIAGNOSIS — E039 Hypothyroidism, unspecified: Secondary | ICD-10-CM | POA: Diagnosis present

## 2017-03-07 DIAGNOSIS — Z9842 Cataract extraction status, left eye: Secondary | ICD-10-CM

## 2017-03-07 DIAGNOSIS — Z79899 Other long term (current) drug therapy: Secondary | ICD-10-CM

## 2017-03-07 DIAGNOSIS — Z9049 Acquired absence of other specified parts of digestive tract: Secondary | ICD-10-CM

## 2017-03-07 DIAGNOSIS — Z8249 Family history of ischemic heart disease and other diseases of the circulatory system: Secondary | ICD-10-CM

## 2017-03-07 LAB — URINALYSIS, ROUTINE W REFLEX MICROSCOPIC
BILIRUBIN URINE: NEGATIVE
GLUCOSE, UA: NEGATIVE mg/dL
HGB URINE DIPSTICK: NEGATIVE
KETONES UR: NEGATIVE mg/dL
NITRITE: NEGATIVE
PH: 5 (ref 5.0–8.0)
Protein, ur: NEGATIVE mg/dL
SPECIFIC GRAVITY, URINE: 1.012 (ref 1.005–1.030)

## 2017-03-07 LAB — CBC
HCT: 36.6 % (ref 36.0–46.0)
HEMOGLOBIN: 11.9 g/dL — AB (ref 12.0–15.0)
MCH: 28.2 pg (ref 26.0–34.0)
MCHC: 32.5 g/dL (ref 30.0–36.0)
MCV: 86.7 fL (ref 78.0–100.0)
Platelets: 169 10*3/uL (ref 150–400)
RBC: 4.22 MIL/uL (ref 3.87–5.11)
RDW: 14.9 % (ref 11.5–15.5)
WBC: 6 10*3/uL (ref 4.0–10.5)

## 2017-03-07 LAB — LIPID PANEL
CHOL/HDL RATIO: 3.5 ratio
Cholesterol: 133 mg/dL (ref 0–200)
HDL: 38 mg/dL — ABNORMAL LOW (ref >40)
LDL Cholesterol: 72 mg/dL (ref 0–99)
TRIGLYCERIDES: 116 mg/dL (ref <150)
VLDL: 23 mg/dL (ref 0–40)

## 2017-03-07 LAB — BASIC METABOLIC PANEL
Anion gap: 10 (ref 5–15)
BUN: 14 mg/dL (ref 6–20)
CHLORIDE: 103 mmol/L (ref 101–111)
CO2: 23 mmol/L (ref 22–32)
CREATININE: 0.69 mg/dL (ref 0.44–1.00)
Calcium: 9 mg/dL (ref 8.9–10.3)
GFR calc non Af Amer: >60 mL/min (ref >60)
GLUCOSE: 136 mg/dL — AB (ref 65–99)
Potassium: 4.2 mmol/L (ref 3.5–5.1)
Sodium: 136 mmol/L (ref 135–145)

## 2017-03-07 LAB — CBG MONITORING, ED: GLUCOSE-CAPILLARY: 75 mg/dL (ref 65–99)

## 2017-03-07 LAB — TROPONIN I

## 2017-03-07 LAB — I-STAT TROPONIN, ED: Troponin i, poc: 0 ng/mL (ref 0.00–0.08)

## 2017-03-07 LAB — GLUCOSE, CAPILLARY: GLUCOSE-CAPILLARY: 163 mg/dL — AB (ref 65–99)

## 2017-03-07 MED ORDER — INSULIN GLARGINE 100 UNIT/ML ~~LOC~~ SOLN
25.0000 [IU] | Freq: Every day | SUBCUTANEOUS | Status: DC
  Administered 2017-03-07 – 2017-03-08 (×2): 25 [IU] via SUBCUTANEOUS
  Filled 2017-03-07 (×3): qty 0.25

## 2017-03-07 MED ORDER — INSULIN ASPART 100 UNIT/ML ~~LOC~~ SOLN: 0.0000 [IU] | Freq: Every day | SUBCUTANEOUS | Status: DC

## 2017-03-07 MED ORDER — ACETAMINOPHEN 160 MG/5ML PO SOLN: 650.0000 mg | ORAL | Status: DC | PRN

## 2017-03-07 MED ORDER — SENNOSIDES-DOCUSATE SODIUM 8.6-50 MG PO TABS: 1.0000 | ORAL_TABLET | Freq: Every evening | ORAL | Status: DC | PRN

## 2017-03-07 MED ORDER — ACETAMINOPHEN 650 MG RE SUPP: 650.0000 mg | RECTAL | Status: DC | PRN

## 2017-03-07 MED ORDER — ACETAMINOPHEN 325 MG PO TABS: 650.0000 mg | ORAL_TABLET | ORAL | Status: DC | PRN

## 2017-03-07 MED ORDER — ASPIRIN EC 81 MG PO TBEC
81.0000 mg | DELAYED_RELEASE_TABLET | Freq: Every day | ORAL | Status: DC
  Administered 2017-03-08 – 2017-03-09 (×2): 81 mg via ORAL
  Filled 2017-03-07 (×2): qty 1

## 2017-03-07 MED ORDER — SODIUM CHLORIDE 0.9 % IV SOLN
INTRAVENOUS | Status: DC
  Administered 2017-03-07: 19:00:00 via INTRAVENOUS

## 2017-03-07 MED ORDER — STROKE: EARLY STAGES OF RECOVERY BOOK
Freq: Once | Status: AC
  Administered 2017-03-08: 1
  Filled 2017-03-07: qty 1

## 2017-03-07 MED ORDER — LORAZEPAM 2 MG/ML IJ SOLN
0.5000 mg | Freq: Once | INTRAMUSCULAR | Status: AC
  Administered 2017-03-07: 0.5 mg via INTRAVENOUS
  Filled 2017-03-07: qty 1

## 2017-03-07 MED ORDER — LEVOTHYROXINE SODIUM 200 MCG PO TABS
200.0000 ug | ORAL_TABLET | Freq: Every day | ORAL | Status: DC
  Administered 2017-03-08 – 2017-03-09 (×2): 200 ug via ORAL
  Filled 2017-03-07: qty 2
  Filled 2017-03-07: qty 1
  Filled 2017-03-07: qty 2
  Filled 2017-03-07: qty 1
  Filled 2017-03-07: qty 2

## 2017-03-07 MED ORDER — NITROGLYCERIN 0.4 MG SL SUBL: 0.4000 mg | SUBLINGUAL_TABLET | SUBLINGUAL | Status: DC | PRN

## 2017-03-07 MED ORDER — SIMVASTATIN 20 MG PO TABS: 20.0000 mg | ORAL_TABLET | Freq: Every evening | ORAL | Status: DC

## 2017-03-07 MED ORDER — HYDROCODONE-ACETAMINOPHEN 5-325 MG PO TABS
1.0000 | ORAL_TABLET | ORAL | Status: DC | PRN
  Administered 2017-03-07 – 2017-03-08 (×4): 1 via ORAL
  Administered 2017-03-09 (×2): 2 via ORAL
  Administered 2017-03-09: 1 via ORAL
  Filled 2017-03-07: qty 1
  Filled 2017-03-07: qty 2
  Filled 2017-03-07 (×2): qty 1
  Filled 2017-03-07 (×2): qty 2
  Filled 2017-03-07: qty 1

## 2017-03-07 MED ORDER — CLOPIDOGREL BISULFATE 75 MG PO TABS
75.0000 mg | ORAL_TABLET | Freq: Every day | ORAL | Status: DC
  Administered 2017-03-07 – 2017-03-08 (×2): 75 mg via ORAL
  Filled 2017-03-07 (×2): qty 1

## 2017-03-07 MED ORDER — INSULIN ASPART 100 UNIT/ML ~~LOC~~ SOLN
0.0000 [IU] | Freq: Three times a day (TID) | SUBCUTANEOUS | Status: DC
  Administered 2017-03-08 (×2): 3 [IU] via SUBCUTANEOUS
  Administered 2017-03-09: 2 [IU] via SUBCUTANEOUS
  Administered 2017-03-09: 3 [IU] via SUBCUTANEOUS

## 2017-03-07 NOTE — ED Triage Notes (Signed)
Patient states she slipped and fell on her wet kitchen floor 10/17 wasn't seen in the ED wen to her ortho has a fx. Left arm was told she could poss. Have rib fx. C/o constant pain in her chest describes as deep pain has been taking hydrocodone for the pain. States she was talking to her daughter this am at 37 and her daughter said her speech was slurred, she states she took a NTG and called her daughter back and her speech was clear. Upon arrival to ed she continues to describe the pain as a deep pain worse with movement .

## 2017-03-07 NOTE — ED Notes (Signed)
PT returns from MRI. PT sitting up in bed eating meal.

## 2017-03-07 NOTE — ED Notes (Signed)
Patient transported to CT 
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
## 2017-03-07 NOTE — ED Notes (Signed)
Pt ambulated to restroom located across hallway from room, tolerated well.

## 2017-03-07 NOTE — ED Notes (Signed)
Patient transported to MRI 

## 2017-03-07 NOTE — ED Notes (Signed)
ED Provider at bedside. 
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
## 2017-03-07 NOTE — ED Notes (Signed)
Meal ordered for PT

## 2017-03-08 ENCOUNTER — Observation Stay (HOSPITAL_BASED_OUTPATIENT_CLINIC_OR_DEPARTMENT_OTHER): Payer: Medicare Other

## 2017-03-08 ENCOUNTER — Observation Stay (HOSPITAL_COMMUNITY): Payer: Medicare Other

## 2017-03-08 DIAGNOSIS — Z809 Family history of malignant neoplasm, unspecified: Secondary | ICD-10-CM | POA: Diagnosis not present

## 2017-03-08 DIAGNOSIS — E039 Hypothyroidism, unspecified: Secondary | ICD-10-CM | POA: Diagnosis present

## 2017-03-08 DIAGNOSIS — R4781 Slurred speech: Secondary | ICD-10-CM

## 2017-03-08 DIAGNOSIS — Z79899 Other long term (current) drug therapy: Secondary | ICD-10-CM | POA: Diagnosis not present

## 2017-03-08 DIAGNOSIS — M1990 Unspecified osteoarthritis, unspecified site: Secondary | ICD-10-CM | POA: Diagnosis present

## 2017-03-08 DIAGNOSIS — G459 Transient cerebral ischemic attack, unspecified: Secondary | ICD-10-CM | POA: Diagnosis present

## 2017-03-08 DIAGNOSIS — Z7902 Long term (current) use of antithrombotics/antiplatelets: Secondary | ICD-10-CM | POA: Diagnosis not present

## 2017-03-08 DIAGNOSIS — I2510 Atherosclerotic heart disease of native coronary artery without angina pectoris: Secondary | ICD-10-CM | POA: Diagnosis not present

## 2017-03-08 DIAGNOSIS — I110 Hypertensive heart disease with heart failure: Secondary | ICD-10-CM | POA: Diagnosis present

## 2017-03-08 DIAGNOSIS — R079 Chest pain, unspecified: Secondary | ICD-10-CM | POA: Diagnosis not present

## 2017-03-08 DIAGNOSIS — Z9842 Cataract extraction status, left eye: Secondary | ICD-10-CM | POA: Diagnosis not present

## 2017-03-08 DIAGNOSIS — Z8249 Family history of ischemic heart disease and other diseases of the circulatory system: Secondary | ICD-10-CM | POA: Diagnosis not present

## 2017-03-08 DIAGNOSIS — I10 Essential (primary) hypertension: Secondary | ICD-10-CM

## 2017-03-08 DIAGNOSIS — R202 Paresthesia of skin: Secondary | ICD-10-CM | POA: Diagnosis not present

## 2017-03-08 DIAGNOSIS — R2990 Unspecified symptoms and signs involving the nervous system: Secondary | ICD-10-CM

## 2017-03-08 DIAGNOSIS — I361 Nonrheumatic tricuspid (valve) insufficiency: Secondary | ICD-10-CM | POA: Diagnosis not present

## 2017-03-08 DIAGNOSIS — Z794 Long term (current) use of insulin: Secondary | ICD-10-CM | POA: Diagnosis not present

## 2017-03-08 DIAGNOSIS — Z7982 Long term (current) use of aspirin: Secondary | ICD-10-CM | POA: Diagnosis not present

## 2017-03-08 DIAGNOSIS — M25511 Pain in right shoulder: Secondary | ICD-10-CM | POA: Diagnosis not present

## 2017-03-08 DIAGNOSIS — Z880 Allergy status to penicillin: Secondary | ICD-10-CM | POA: Diagnosis not present

## 2017-03-08 DIAGNOSIS — Z96653 Presence of artificial knee joint, bilateral: Secondary | ICD-10-CM | POA: Diagnosis present

## 2017-03-08 DIAGNOSIS — Z885 Allergy status to narcotic agent status: Secondary | ICD-10-CM | POA: Diagnosis not present

## 2017-03-08 DIAGNOSIS — E785 Hyperlipidemia, unspecified: Secondary | ICD-10-CM | POA: Diagnosis present

## 2017-03-08 DIAGNOSIS — E118 Type 2 diabetes mellitus with unspecified complications: Secondary | ICD-10-CM

## 2017-03-08 DIAGNOSIS — E890 Postprocedural hypothyroidism: Secondary | ICD-10-CM | POA: Diagnosis not present

## 2017-03-08 DIAGNOSIS — I5042 Chronic combined systolic (congestive) and diastolic (congestive) heart failure: Secondary | ICD-10-CM | POA: Diagnosis present

## 2017-03-08 DIAGNOSIS — Z9049 Acquired absence of other specified parts of digestive tract: Secondary | ICD-10-CM | POA: Diagnosis not present

## 2017-03-08 DIAGNOSIS — E119 Type 2 diabetes mellitus without complications: Secondary | ICD-10-CM | POA: Diagnosis present

## 2017-03-08 DIAGNOSIS — I252 Old myocardial infarction: Secondary | ICD-10-CM | POA: Diagnosis not present

## 2017-03-08 DIAGNOSIS — Z955 Presence of coronary angioplasty implant and graft: Secondary | ICD-10-CM | POA: Diagnosis not present

## 2017-03-08 DIAGNOSIS — Z90710 Acquired absence of both cervix and uterus: Secondary | ICD-10-CM | POA: Diagnosis not present

## 2017-03-08 DIAGNOSIS — I8500 Esophageal varices without bleeding: Secondary | ICD-10-CM | POA: Diagnosis present

## 2017-03-08 LAB — ECHOCARDIOGRAM COMPLETE
Height: 66 in
Weight: 3275.2 oz

## 2017-03-08 LAB — GLUCOSE, CAPILLARY
GLUCOSE-CAPILLARY: 74 mg/dL (ref 65–99)
GLUCOSE-CAPILLARY: 171 mg/dL — AB (ref 65–99)
Glucose-Capillary: 155 mg/dL — ABNORMAL HIGH (ref 65–99)
Glucose-Capillary: 120 mg/dL — ABNORMAL HIGH (ref 65–99)

## 2017-03-08 LAB — TSH: TSH: 0.059 u[IU]/mL — ABNORMAL LOW (ref 0.350–4.500)

## 2017-03-08 LAB — HEMOGLOBIN A1C
Hgb A1c MFr Bld: 8 % — ABNORMAL HIGH (ref 4.8–5.6)
MEAN PLASMA GLUCOSE: 183 mg/dL

## 2017-03-08 LAB — TROPONIN I: Troponin I: <0.03 ng/mL (ref <0.03)

## 2017-03-08 MED ORDER — METOPROLOL TARTRATE 50 MG PO TABS
50.0000 mg | ORAL_TABLET | Freq: Two times a day (BID) | ORAL | Status: DC
  Administered 2017-03-08 – 2017-03-09 (×2): 50 mg via ORAL
  Filled 2017-03-08: qty 1
  Filled 2017-03-08: qty 2

## 2017-03-08 MED ORDER — ATORVASTATIN CALCIUM 10 MG PO TABS
20.0000 mg | ORAL_TABLET | Freq: Every day | ORAL | Status: DC
  Administered 2017-03-08: 20 mg via ORAL
  Filled 2017-03-08: qty 2

## 2017-03-08 NOTE — Progress Notes (Signed)
Speech Pathology:  Returned for cognitive assessment after OT observed functional difficulty in path-finding and recall.  Pt adamantly declined evaluation, stating that she was fine and has experienced no changes in those areas.    Our services will sign off. Claudis Giovanelli L. Tivis Ringer, [place] CCC/SLP Pager [phone]
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
## 2017-03-08 NOTE — Evaluation (Signed)
Occupational Therapy Evaluation Patient Details Name: Tonya Hunter MRN: [id number] DOB: Aug 30, 1934 Today's Date: 03/08/2017    History of Present Illness Pt is an 82 y.o.femalewith PMH of CAD, DM, hyperlipidemia, HTN. Had a recent fall with fracture to the left arm. Presented to the ED on 03/07/17 with chief complaint of slurred speech and intermittent chest pain. Initial evaluation concerning for TIA. Imaging shows no acute findings.   Clinical Impression   PTA, pt was living with her husband and was performing ADLs, IADLs, and driving. Pt currently requiring Min Guard-Min A and Mod-Max VCs for ADLs due to decreased balance, coordination, cognition, and limitations to functional use of LUE. Pt presenting with poor ST memory, problem, solving, awareness, and attention. Pt would benefit form further acute OT to facilitate safe dc. Recommend home with 24 hour supervision and Neuro Outpatient OT to increase safety and independence with ADLs and IADLs.     Follow Up Recommendations  Outpatient OT;Supervision/Assistance - 24 hour (Neuro OP OT)    Equipment Recommendations  None recommended by OT    Recommendations for Other Services PT consult     Precautions / Restrictions Precautions Precautions: Fall Restrictions Weight Bearing Restrictions: No      Mobility Bed Mobility Overal bed mobility: Modified Independent             General bed mobility comments: pt did not require any physical assist to move from sitting to supine. Use of bed rails and able to scoot her self toward the [facility].  Transfers Overall transfer level: Needs assistance Equipment used: None Transfers: Sit to/from Stand Sit to Stand: Min guard         General transfer comment: Min Guard for safety    Balance Overall balance assessment: Needs assistance Sitting-balance support: Feet supported;No upper extremity supported Sitting balance-Leahy Scale: Good     Standing balance support: During  functional activity;No upper extremity supported Standing balance-Leahy Scale: Normal Standing balance comment: pt able to bend down and pick up object off floor and place on countertop with min guard for safety but no physical assistance                           ADL either performed or assessed with clinical judgement   ADL Overall ADL's : Needs assistance/impaired Eating/Feeding: Set up;Sitting   Grooming: Min guard;Standing   Upper Body Bathing: Minimal assistance;Sitting   Lower Body Bathing: Minimal assistance;Sit to/from stand   Upper Body Dressing : Minimal assistance;Sitting   Lower Body Dressing: Minimal assistance;Sit to/from stand   Toilet Transfer: Min guard;Ambulation (Simulated in room)       Tub/ Shower Transfer: Tub transfer;Moderate assistance;Ambulation;3 in 1;Cueing for sequencing;Cueing for safety   Functional mobility during ADLs: Minimal assistance;Min guard;Cueing for sequencing;Cueing for safety General ADL Comments: Pt presenting with decreased funcitonal performance due to poor balance, cognition, and awareness. Pt with decreased attention, ST memory, and problem solving. Pt additionally limited by LUE fx.      Vision   Vision Assessment?: Vision impaired- to be further tested in functional context;Yes Eye Alignment: Within Functional Limits Tracking/Visual Pursuits: Decreased smoothness of horizontal tracking;Decreased smoothness of vertical tracking Convergence: Impaired - to be further tested in functional context Additional Comments: Pt demosntrating decreased visual attention and poor smooth tracking.      Perception     Praxis      Pertinent Vitals/Pain Pain Assessment: No/denies pain     Hand Dominance Right  Extremity/Trunk Assessment Upper Extremity Assessment Upper Extremity Assessment: LUE deficits/detail LUE Deficits / Details: L forearm fx LUE Coordination: decreased fine motor   Lower Extremity  Assessment Lower Extremity Assessment: Overall WFL for tasks assessed       Communication Communication Communication: No difficulties   Cognition Arousal/Alertness: Awake/alert Behavior During Therapy: WFL for tasks assessed/performed Overall Cognitive Status: Impaired/Different from baseline Area of Impairment: Attention;Memory;Following commands;Safety/judgement;Awareness;Problem solving                   Current Attention Level: Sustained Memory: Decreased short-term memory Following Commands: Follows one step commands inconsistently;Follows one step commands with increased time Safety/Judgement: Decreased awareness of safety;Decreased awareness of deficits Awareness: Intellectual Problem Solving: Slow processing;Requires verbal cues;Requires tactile cues;Difficulty sequencing General Comments: Pt demosntrating decreased awarness throughout session. Pt presenitn with poro attention, ST memory (recalling 1/3 words), safety awarness, and problem solving. Pt requiring increased VCs and time to follow simple commands. Required Max VCs to locate room after walking in hall. Pt reporting that sheis back to baselien and able to compelte cognittive tasks without difficulty.    General Comments  Husband at bedside. Educated pt on edema management and BEFAST    Exercises Exercises: General Upper Extremity General Exercises - Upper Extremity Shoulder Flexion: AAROM;Left;10 reps;Seated Shoulder Extension: AAROM;Right;10 reps;Seated Elbow Flexion: AROM;Left;10 reps;Seated Elbow Extension: AROM;Left;10 reps;Seated Digit Composite Flexion: AROM;Left;5 reps;Seated Composite Extension: AROM;Left;5 reps;Seated   Shoulder Instructions      Home Living Family/patient expects to be discharged to:: Private residence Living Arrangements: Spouse/significant other Available Help at Discharge: Family;Available 24 hours/day Type of Home: House Home Access: Stairs to enter [employer] of Steps: 2 Entrance Stairs-Rails: Right Home Layout: One level     Bathroom Shower/Tub: Teacher, early years/pre: Standard Bathroom Accessibility: Yes   Home Equipment: Bedside commode;Walker - 2 wheels;Cane - single point          Prior Functioning/Environment Level of Independence: Independent        Comments: ADLs, IADLs, driving, goes to the [facility]        OT Problem List: Decreased activity tolerance;Impaired balance (sitting and/or standing);Decreased cognition;Decreased safety awareness;Decreased knowledge of use of DME or AE      OT Treatment/Interventions: Self-care/ADL training;Therapeutic exercise;Energy conservation;DME and/or AE instruction;Therapeutic activities;Patient/family education    OT Goals(Current goals can be found in the care plan section) Acute Rehab OT Goals Patient Stated Goal: to go home OT Goal Formulation: With patient Time For Goal Achievement: 03/22/17 Potential to Achieve Goals: Good ADL Goals Pt Will Perform Grooming: with set-up;with supervision;standing (1-2 VCs for memory) Pt Will Perform Lower Body Dressing: with set-up;with supervision;sit to/from stand Additional ADL Goal #1: Pt will perform two part trail making task with Min VCs Additional ADL Goal #2: Pt will perform three grooming tasks with MIn VCs  OT Frequency: Min 2X/week   Barriers to D/C:            Co-evaluation              AM-PAC PT "6 Clicks" Daily Activity     Outcome Measure Help from another person eating meals?: None Help from another person taking care of personal grooming?: A Little Help from another person toileting, which includes using toliet, bedpan, or urinal?: A Little Help from another person bathing (including washing, rinsing, drying)?: A Little Help from another person to put on and taking off regular upper body clothing?: A Little Help from another person to put on and taking  off regular lower body clothing?: A  Little 6 Click Score: 19   End of Session Equipment Utilized During Treatment: Gait belt Nurse Communication: Mobility status  Activity Tolerance: Patient tolerated treatment well Patient left: in bed;with call bell/phone within reach;with bed alarm set;with family/visitor present  OT Visit Diagnosis: Unsteadiness on feet (R26.81);Other abnormalities of gait and mobility (R26.89);Muscle weakness (generalized) (M62.81);Other symptoms and signs involving cognitive function                Time: 6219-4712 OT Time Calculation (min): 30 min Charges:  OT General Charges $OT Visit: 1 Visit OT Evaluation $OT Eval Moderate Complexity: 1 Mod OT Treatments $Self Care/Home Management : 8-22 mins G-Codes: OT G-codes **NOT FOR INPATIENT CLASS** Functional Assessment Tool Used: Clinical judgement Functional Limitation: Self care Self Care Current Status (X2712): At least 20 percent but less than 40 percent impaired, limited or restricted Self Care Goal Status (J2909): At least 1 percent but less than 20 percent impaired, limited or restricted   [place], OTR/L Acute Rehab Pager: [phone] Office: [place] 03/08/2017, 2:39 PM
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
## 2017-03-08 NOTE — Progress Notes (Signed)
Pt arrived to 3W 14 at around . Pt A&O x 4, no pain,  Pt V/S taken, WNL,  fluids running at 50 cc/hr. [place] 0, Pt without distress.  Husband at the bedside. Diet ordered, Pt is Q2 neuro checks until 0000, will monitor.

## 2017-03-08 NOTE — Care Management Obs Status (Signed)
Real NOTIFICATION   Patient Details  Name: Tonya Hunter MRN: [id number] Date of Birth: 01-Mar-1935   Medicare Observation Status Notification Given:  Yes    Pollie Friar, RN 03/08/2017, 3:56 PM

## 2017-03-08 NOTE — Progress Notes (Signed)
  Echocardiogram 2D Echocardiogram has been performed.  Tonya Hunter 03/08/2017, 11:46 AM

## 2017-03-08 NOTE — Progress Notes (Signed)
PROGRESS NOTE    Tonya Hunter  JJH:[id number] DOB: Dec 29, 1934 DOA: 03/07/2017 PCP: [name], MD   Outpatient Specialists:     Brief Narrative:  Tonya Hunter is a 82 y.o. female with medical history significant for CAD status post DES to LAD in 2007, status post DES for ISR of LAD in 2011 diabetes, hyperlipidemia, hypertension, recent fall with fracture to the left arm presents to the emergency department chief complaint slurred speech intermittent chest pain.  Initial evaluation concerning for TIA.  [facility] are asked to admit  Information is obtained from the patient and her husband who is at the bedside.  She states she awakened this morning and she "just did not feel good".  Patient is unable to articulate any specific symptom.  She denied headache dizziness nausea vomiting abdominal pain.  She denied shortness of breath fever chills cough.  She states "I just did not feel good".  I called my daughter who stated that I sounded like I had slurred speech.  He denies headache blurred vision syncope or near syncope.  Associated symptoms include intermittent chest pain but she feels that is associated with her recent fall and rib bruising".  However she has a cardiac history is quite anxious she was having some sort of cardiac event.  He states she called EMS was given a nitro and her slurred speech immediately resolved   Assessment & Plan:   Principal Problem:   Stroke-like symptoms Active Problems:   Essential hypertension   Hypothyroidism   CAD (coronary artery disease)   Diabetes mellitus with complication (HCC)   Chest pain   Left shoulder pain   Strokelike symptoms.  Slurred speech numbness and tingling of her right arm.  resolved with PO nitro.... Risk factors include hypertension CAD diabetes hyperlipidemia.  CT of the head without acute abnormality -MRI/MRA: negative for acute issues -Carotid Doppler pending -2D echo EF decrease from prior -Lipid  panel: 72 hemoglobin A1c: 8 -Continue Plavix/ASA and statin -Hold Lopressor and Lasix -carb modified diet   Intermittent chest pain.  Resolved on admission.  Patient with history as noted above.  Initial troponin negative.  EKG without acute changes. -Cycle troponin -Continue Plavix and aspirin Echo shows decreased EF From prior  Hypertension.  Blood pressure on the high end of normal.  Home medications include Lasix metoprolol -Hold antihypertensive meds for now -monitor blood pressure closely and resume as indicated  Diabetes.  Serum glucose 136 on admission.  Home medications include Lantus, Humalog 75/25, metformin -Continue Lantus but at half the home dose -sliding scale for optimal control -Hemoglobin A1c: 8- room for better control  Fracture left arm last week.  Currently in cast. -Elevate -Ice -continue home pain regimen   DVT prophylaxis:  scd  Code Status: Full Code   Family Communication:   Disposition Plan:     Consultants:   Cardiology  Neuro (Dr. Leonel Ramsay)  Procedures:     Subjective:  Speech and right hand numbness relieved yesterday after nitro dose  Objective: Vitals:   03/08/17 0000 03/08/17 0500 03/08/17 0517 03/08/17 1300  BP: 126/66  (!) 143/84 (!) 146/64  Pulse: 83  83 90  Resp: 20  20 18   Temp: (!) 97.5 F (36.4 C)  97.6 F (36.4 C) 98 F (36.7 C)  TempSrc: Oral  Oral Oral  SpO2: 95%  97% 98%  Weight:  92.9 kg (204 lb 11.2 oz)    Height:  5' 6"  (1.676 m)  Intake/Output Summary (Last 24 hours) at 03/08/17 1418 Last data filed at 03/08/17 0400  Gross per 24 hour  Intake            442.5 ml  Output                0 ml  Net            442.5 ml   Filed Weights   03/08/17 0500  Weight: 92.9 kg (204 lb 11.2 oz)    Examination:  General exam: NAD in bed, left arm elevated Respiratory system: Clear to auscultation. Respiratory effort normal. Cardiovascular system: S1 & S2 heard, RRR. No JVD, murmurs, rubs,  gallops or clicks. No pedal edema. Gastrointestinal system: Abdomen is nondistended, soft and nontender. No organomegaly or masses felt. Normal bowel sounds heard. Central nervous system: Alert and oriented. No focal neurological deficits. Extremities: left arm in cast-- warm, purple in color from bruising Psychiatry: Judgement and insight appear normal. Mood & affect appropriate.     Data Reviewed: I have personally reviewed following labs and imaging studies  CBC:  Recent Labs Lab 03/07/17 1415  WBC 6.0  HGB 11.9*  HCT 36.6  MCV 86.7  PLT 213   Basic Metabolic Panel:  Recent Labs Lab 03/07/17 1415  NA 136  K 4.2  CL 103  CO2 23  GLUCOSE 136*  BUN 14  CREATININE 0.69  CALCIUM 9.0   GFR: Estimated Creatinine Clearance: 64.4 mL/min (by C-G formula based on SCr of 0.69 mg/dL). Liver Function Tests: No results for input(s): AST, ALT, ALKPHOS, BILITOT, PROT, ALBUMIN in the last 168 hours. No results for input(s): LIPASE, AMYLASE in the last 168 hours. No results for input(s): AMMONIA in the last 168 hours. Coagulation Profile: No results for input(s): INR, PROTIME in the last 168 hours. Cardiac Enzymes:  Recent Labs Lab 03/07/17 1937 03/08/17 0458  TROPONINI <0.03 <0.03   BNP (last 3 results) No results for input(s): PROBNP in the last 8760 hours. HbA1C:  Recent Labs  03/07/17 1415  HGBA1C 8.0*   CBG:  Recent Labs Lab 03/07/17 1826 03/07/17 2206 03/08/17 0642 03/08/17 1308  GLUCAP 75 163* 74 171*   Lipid Profile:  Recent Labs  03/07/17 1415  CHOL 133  HDL 38*  LDLCALC 72  TRIG 116  CHOLHDL 3.5   Thyroid Function Tests: No results for input(s): TSH, T4TOTAL, FREET4, T3FREE, THYROIDAB in the last 72 hours. Anemia Panel: No results for input(s): VITAMINB12, FOLATE, FERRITIN, TIBC, IRON, RETICCTPCT in the last 72 hours. Urine analysis:    Component Value Date/Time   COLORURINE YELLOW 03/07/2017 [place] 03/07/2017 1648     LABSPEC 1.012 03/07/2017 1648   PHURINE 5.0 03/07/2017 1648   GLUCOSEU NEGATIVE 03/07/2017 1648   HGBUR NEGATIVE 03/07/2017 [place] 03/07/2017 1648   KETONESUR NEGATIVE 03/07/2017 1648   PROTEINUR NEGATIVE 03/07/2017 1648   UROBILINOGEN 1.0 06/16/2014 2138   NITRITE NEGATIVE 03/07/2017 1648   LEUKOCYTESUR MODERATE (A) 03/07/2017 1648     )No results found for this or any previous visit (from the past 240 hour(s)).    Anti-infectives    None       Radiology Studies: Dg Chest 2 View  Addendum Date: 03/07/2017   ADDENDUM REPORT: 03/07/2017 14:53 ADDENDUM: There is severe degenerative joint disease of the left glenohumeral joint. No fracture is noted. Electronically Signed   By: Marijo Conception, M.D.   On: 03/07/2017 14:53   Result  Date: 03/07/2017 CLINICAL DATA:  Chest pain. EXAM: CHEST  2 VIEW COMPARISON:  Radiographs of August 02, 2016. CT scan of August 29, 2016. FINDINGS: The heart size and mediastinal contours are within normal limits. Stable right upper lobe nodule is noted. No acute pulmonary abnormality is noted. No pneumothorax or pleural effusion is noted. Degenerative changes seen involving the left glenohumeral joint. IMPRESSION: No active cardiopulmonary disease. Electronically Signed: By: Marijo Conception, M.D. On: 03/07/2017 14:06   Ct Head Wo Contrast  Result Date: 03/07/2017 CLINICAL DATA:  82 year old female slipped and fell on wet kitchen floor 03/01/2017. Onset of slurred speech this morning. Initial encounter. EXAM: CT HEAD WITHOUT CONTRAST TECHNIQUE: Contiguous axial images were obtained from the base of the skull through the vertex without intravenous contrast. COMPARISON:  06/09/2016 brain MR. No comparison head CT available for direct review. FINDINGS: Brain: No intracranial hemorrhage or CT evidence of large acute infarct. Prominent dural calcifications/hyperostosis rather than meningioma suspected. Overall, no intracranial mass lesion  noted on this unenhanced exam. Vascular: Vascular calcifications. Skull: No skull fracture. Sinuses/Orbits: No acute orbital abnormality. Visualized paranasal sinuses are clear. Other: Mastoid air cells and middle ear cavities are clear. IMPRESSION: No skull fracture or intracranial hemorrhage. No CT evidence of large acute infarct. Probable hyperostosis frontalis interna and dural calcifications rather than meningiomas. Electronically Signed   By: Genia Del M.D.   On: 03/07/2017 14:05   Mr Brain Wo Contrast  Result Date: 03/07/2017 CLINICAL DATA:  Slurred speech. EXAM: MRI HEAD WITHOUT CONTRAST MRA HEAD WITHOUT CONTRAST TECHNIQUE: Multiplanar, multiecho pulse sequences of the brain and surrounding structures were obtained without intravenous contrast. Angiographic images of the head were obtained using MRA technique without contrast. COMPARISON:  06/09/2016 FINDINGS: MRI HEAD FINDINGS Brain: No acute infarction, hemorrhage, hydrocephalus, extra-axial collection or mass lesion. Mild for age chronic microvascular ischemic type change in the cerebral white matter. Age normal brain volume. T1 hyperintensity in the bilateral basal ganglia, attributed to patient's history of chronic liver disease with cirrhosis. Vascular: Arterial findings below. Normal dural venous sinus flow voids. Skull and upper cervical spine: Negative for marrow lesion. C2-3 ankylosis and C3-4 degenerative facet spurring. Sinuses/Orbits: Negative. Other: Progressively motion degraded study. MRA HEAD FINDINGS Significantly motion degraded. No evidence of major branch occlusion. No convincing proximal flow limiting stenosis. IMPRESSION: Brain MRI: 1. No acute finding or explanation for symptoms. 2. Altered basal ganglia signal attributed to patient's chronic liver disease. Otherwise age normal. Intracranial MRA: Moderately motion degraded. No major vessel occlusion. No visualized flow limiting stenosis. Electronically Signed   By: Monte Fantasia M.D.   On: 03/07/2017 18:24   Mr Jodene Nam Head Wo Contrast  Result Date: 03/07/2017 CLINICAL DATA:  Slurred speech. EXAM: MRI HEAD WITHOUT CONTRAST MRA HEAD WITHOUT CONTRAST TECHNIQUE: Multiplanar, multiecho pulse sequences of the brain and surrounding structures were obtained without intravenous contrast. Angiographic images of the head were obtained using MRA technique without contrast. COMPARISON:  06/09/2016 FINDINGS: MRI HEAD FINDINGS Brain: No acute infarction, hemorrhage, hydrocephalus, extra-axial collection or mass lesion. Mild for age chronic microvascular ischemic type change in the cerebral white matter. Age normal brain volume. T1 hyperintensity in the bilateral basal ganglia, attributed to patient's history of chronic liver disease with cirrhosis. Vascular: Arterial findings below. Normal dural venous sinus flow voids. Skull and upper cervical spine: Negative for marrow lesion. C2-3 ankylosis and C3-4 degenerative facet spurring. Sinuses/Orbits: Negative. Other: Progressively motion degraded study. MRA HEAD FINDINGS Significantly motion degraded. No evidence of major branch  occlusion. No convincing proximal flow limiting stenosis. IMPRESSION: Brain MRI: 1. No acute finding or explanation for symptoms. 2. Altered basal ganglia signal attributed to patient's chronic liver disease. Otherwise age normal. Intracranial MRA: Moderately motion degraded. No major vessel occlusion. No visualized flow limiting stenosis. Electronically Signed   By: Monte Fantasia M.D.   On: 03/07/2017 18:24        Scheduled Meds: . aspirin EC  81 mg Oral Daily  . clopidogrel  75 mg Oral QHS  . insulin aspart  0-15 Units Subcutaneous TID WC  . insulin aspart  0-5 Units Subcutaneous QHS  . insulin glargine  25 Units Subcutaneous QHS  . levothyroxine  200 mcg Oral QAC breakfast  . simvastatin  20 mg Oral QPM   Continuous Infusions:   LOS: 0 days    Time spent: 35 min    [place], DO [facility] Pager [phone]  If 7PM-7AM, please contact night-coverage [email or web address] Password [email or web address] 03/08/2017, 2:18 PM

## 2017-03-08 NOTE — Progress Notes (Signed)
Physical Therapy Evaluation Patient Details Name: Tonya Hunter MRN: [id number] DOB: [birth year] Today's Date: 03/08/2017   History of Present Illness  Pt is an 82 y.o.femalewith PMH of CAD, DM, hyperlipidemia, HTN. Had a recent fall with fracture to the left arm. Presented to the ED on 03/07/17 with chief complaint of slurred speech and intermittent chest pain. Initial evaluation concerning for TIA. Imaging shows no acute findings.  Clinical Impression  Patient presents with the above. Evaluated patient and noted findings (See PT Problem List below). Prior to admission patient was independent with mobility and ADLs. She will have necessary level of caregiver assist upon d/c. PT will continue to follow acutely to address deficits and maximize function for safe d/c home.     Follow Up Recommendations No PT follow up;Supervision - Intermittent    Equipment Recommendations  None recommended by PT    Recommendations for Other Services       Precautions / Restrictions Precautions Precautions: Fall Restrictions Weight Bearing Restrictions: No      Mobility  Bed Mobility Overal bed mobility: Modified Independent             General bed mobility comments: pt did not require any physical assist to move from sitting to supine. Use of bed rails and able to scoot her self toward the [facility].  Transfers Overall transfer level: Needs assistance Equipment used: None Transfers: Sit to/from Stand Sit to Stand: Min guard         General transfer comment: pt performed sit<>stand x3; needing min guard for safety  Ambulation/Gait Ambulation/Gait assistance: Supervision Ambulation Distance (Feet): 110 Feet Assistive device: None Gait Pattern/deviations: Step-through pattern;Decreased step length - right;Decreased step length - left;Decreased stride length;Decreased dorsiflexion - right;Decreased dorsiflexion - left Gait velocity: decreased   General Gait Details: pt needs cueing for  step length and pacing. Instability noted but no significant LOB; patient able to self correct. Ambulating with a cognitive task was difficult for patient, interrupting gait speed and needing to stop to think about answers.    Stairs            Wheelchair Mobility    Modified Rankin (Stroke Patients Only)       Balance Overall balance assessment: Needs assistance Sitting-balance support: Feet supported;No upper extremity supported Sitting balance-Leahy Scale: Good     Standing balance support: During functional activity;No upper extremity supported Standing balance-Leahy Scale: Normal Standing balance comment: pt able to bend down and pick up object off floor and place on countertop with min guard for safety but no physical assistance                             Pertinent Vitals/Pain Pain Assessment: No/denies pain    Home Living Family/patient expects to be discharged to:: Private residence Living Arrangements: Spouse/significant other Available Help at Discharge: Family;Available 24 hours/day Type of Home: House Home Access: Stairs to enter Entrance Stairs-Rails: Right Entrance Stairs-Number of Steps: 2 Home Layout: One level Home Equipment: Bedside commode;Walker - 2 wheels;Cane - single point      Prior Function Level of Independence: Independent         Comments: still drives and completes ADLs     Hand Dominance   Dominant Hand: Right    Extremity/Trunk Assessment   Upper Extremity Assessment Upper Extremity Assessment: Defer to OT evaluation    Lower Extremity Assessment Lower Extremity Assessment: Overall WFL for tasks assessed (sensation to LT &  coordination intact; strength 5/5 bil)       Communication   Communication: No difficulties  Cognition Arousal/Alertness: Awake/alert Behavior During Therapy: WFL for tasks assessed/performed Overall Cognitive Status: Impaired/Different from baseline Area of Impairment: Following  commands;Safety/judgement;Awareness;Problem solving                       Following Commands: Follows one step commands inconsistently;Follows one step commands with increased time Safety/Judgement: Decreased awareness of safety;Decreased awareness of deficits Awareness: Intellectual Problem Solving: Slow processing;Requires verbal cues General Comments: Pt talkative throughout session. A&Ox4.       General Comments      Exercises     Assessment/Plan    PT Assessment Patent does not need any further PT services  PT Problem List         PT Treatment Interventions      PT Goals (Current goals can be found in the Care Plan section)  Acute Rehab PT Goals Patient Stated Goal: to go home PT Goal Formulation: With patient Time For Goal Achievement: 03/22/17 Potential to Achieve Goals: Good    Frequency     Barriers to discharge        Co-evaluation               AM-PAC PT "6 Clicks" Daily Activity  Outcome Measure Difficulty turning over in bed (including adjusting bedclothes, sheets and blankets)?: None Difficulty moving from lying on back to sitting on the side of the bed? : None Difficulty sitting down on and standing up from a chair with arms (e.g., wheelchair, bedside commode, etc,.)?: A Little Help needed moving to and from a bed to chair (including a wheelchair)?: A Little Help needed walking in hospital room?: A Little Help needed climbing 3-5 steps with a railing? : A Lot 6 Click Score: 19    End of Session Equipment Utilized During Treatment: Gait belt Activity Tolerance: Patient tolerated treatment well Patient left: in bed (with transport staff) Nurse Communication: Mobility status PT Visit Diagnosis: Unsteadiness on feet (R26.81);History of falling (Z91.81)    Time: 1700-1749 PT Time Calculation (min) (ACUTE ONLY): 22 min   Charges:   PT Evaluation $PT Eval Moderate Complexity: 1 Mod     PT G CodesSindy Guadeloupe,  SPT [phone] office   Margarita Grizzle 03/08/2017, 9:57 AM

## 2017-03-08 NOTE — Evaluation (Signed)
SLP Cancellation Note  Patient Details Name: Tonya Hunter MRN: [id number] DOB: 1935-04-18   Cancelled treatment:       Reason Eval/Treat Not Completed: SLP screened, no needs identified, will sign off (pt reports resolution of speech deficits after nitroglycerin taken)  No dysarthria apparent.     Macario Golds 03/08/2017, 7:56 AM

## 2017-03-09 ENCOUNTER — Inpatient Hospital Stay (HOSPITAL_COMMUNITY): Payer: Medicare Other

## 2017-03-09 DIAGNOSIS — E890 Postprocedural hypothyroidism: Secondary | ICD-10-CM

## 2017-03-09 DIAGNOSIS — I361 Nonrheumatic tricuspid (valve) insufficiency: Secondary | ICD-10-CM

## 2017-03-09 DIAGNOSIS — R079 Chest pain, unspecified: Secondary | ICD-10-CM

## 2017-03-09 DIAGNOSIS — M25511 Pain in right shoulder: Secondary | ICD-10-CM

## 2017-03-09 LAB — ECHOCARDIOGRAM LIMITED
AO mean calculated velocity dopler: 198 cm/s
AOPV: 0.51 m/s
AVCELMEANRAT: 0.46
AVG: 18 mmHg
AVPG: 31 mmHg
AVPKVEL: 277 cm/s
Area-P 1/2: 2 cm2
CHL CUP DOP CALC LVOT VTI: 32.9 cm
E decel time: 377 msec
Height: 66 in
LVOT peak grad rest: 8 mmHg
LVOT peak vel: 141 cm/s
LVOTVTI: 0.45 cm
MV Dec: 377
MV pk A vel: 126 m/s
MV pk E vel: 110 m/s
MVPG: 5 mmHg
P 1/2 time: 110 ms
VTI: 73.1 cm
Weight: 3275.2 oz

## 2017-03-09 LAB — GLUCOSE, CAPILLARY
GLUCOSE-CAPILLARY: 152 mg/dL — AB (ref 65–99)
Glucose-Capillary: 131 mg/dL — ABNORMAL HIGH (ref 65–99)
Glucose-Capillary: 189 mg/dL — ABNORMAL HIGH (ref 65–99)

## 2017-03-09 MED ORDER — ATORVASTATIN CALCIUM 20 MG PO TABS: 20.0000 mg | ORAL_TABLET | Freq: Every day | ORAL | 3 refills | Status: DC

## 2017-03-09 MED ORDER — LEVOTHYROXINE SODIUM 75 MCG PO TABS: 150.0000 ug | ORAL_TABLET | Freq: Every day | ORAL | Status: DC

## 2017-03-09 MED ORDER — PERFLUTREN LIPID MICROSPHERE
1.0000 mL | INTRAVENOUS | Status: AC | PRN
  Administered 2017-03-09: 2 mL via INTRAVENOUS
  Filled 2017-03-09: qty 10

## 2017-03-09 MED ORDER — LEVOTHYROXINE SODIUM 150 MCG PO TABS: 150.0000 ug | ORAL_TABLET | Freq: Every day | ORAL | 1 refills | Status: DC

## 2017-03-09 NOTE — Progress Notes (Signed)
Pt discharged at this time with her husband taking all personal belongings. IV discontinued, dry dressing applied. Discharge instructions provided along with prescriptions with verbal understanding. Pt will follow up with Md per dc summary. No noted distress.

## 2017-03-09 NOTE — Care Management Note (Signed)
Case Management Note  Patient Details  Name: Tonya Hunter MRN: [id number] Date of Birth: 1934-12-03  Subjective/Objective:                    Action/Plan: Pt discharging home with self care. CM consulted for outpatient therapy. CM met with the patient and her husband and they currently are refusing outpatient therapy. Husband to provide transportation home.   Expected Discharge Date:  03/09/17               Expected Discharge Plan:  Home/Self Care  In-House Referral:     Discharge planning Services  CM Consult  Post Acute Care Choice:    Choice offered to:     DME Arranged:    DME Agency:     HH Arranged:    HH Agency:     Status of Service:  Completed, signed off  If discussed at [employer] of Stay Meetings, dates discussed:    Additional Comments:  Pollie Friar, RN 03/09/2017, 3:39 PM

## 2017-03-09 NOTE — Discharge Summary (Signed)
Physician Discharge Summary   Patient ID: Tonya Hunter MRN: [id number] DOB/AGE: [birth year] 80 y.o.  Admit date: 03/07/2017 Discharge date: 03/09/2017  Primary Care Physician:  [name], MD  Discharge Diagnoses:   . Transient ischemic attack . CAD (coronary artery disease) . Chest pain . Diabetes mellitus with complication ([place]) . Essential hypertension . Hypothyroidism . Transient ischemic attack . Left wrist pain   Consults:   Cardiology Neurology  Recommendations for Outpatient Follow-up:  1. Please repeat CBC/BMET at next visit 2. TSH 0.059, Synthroid decreased to 150 MCG daily (outpatient dose was 200 MCG daily)   DIET: Heart healthy diet    Allergies:   Allergies  Allergen Reactions  . Demerol [Meperidine] Nausea And Vomiting  . Penicillins Other (See Comments)    Reaction:  Weakness  Has patient had a PCN reaction causing immediate rash, facial/tongue/throat swelling, SOB or lightheadedness with hypotension: No Has patient had a PCN reaction causing severe rash involving mucus membranes or skin necrosis: No Has patient had a PCN reaction that required hospitalization Yes Has patient had a PCN reaction occurring within the last 10 years: No If all of the above answers are "NO", then may proceed with Cephalosporin use.     DISCHARGE MEDICATIONS: Current Discharge Medication List    START taking these medications   Details  atorvastatin (LIPITOR) 20 MG tablet Take 1 tablet (20 mg total) by mouth at bedtime. Qty: 30 tablet, Refills: 3      CONTINUE these medications which have CHANGED   Details  levothyroxine (SYNTHROID, LEVOTHROID) 150 MCG tablet Take 1 tablet (150 mcg total) by mouth daily before breakfast. Qty: 30 tablet, Refills: 1      CONTINUE these medications which have NOT CHANGED   Details  aspirin EC 81 MG tablet Take 81 mg by mouth daily.    clopidogrel (PLAVIX) 75 MG tablet Take 1 tablet (75 mg total) by mouth at bedtime.  Please resume taking on 06/19/2016 Qty: 30 tablet, Refills: 0    furosemide (LASIX) 40 MG tablet Take 20 mg by mouth 2 (two) times daily.     HYDROcodone-acetaminophen (NORCO/VICODIN) 5-325 MG tablet Take 1 tablet by mouth every 4 (four) hours as needed for pain.    insulin glargine (LANTUS) 100 UNIT/ML injection Inject 50 Units into the skin at bedtime.    insulin lispro protamine-lispro (HUMALOG 75/25 MIX) (75-25) 100 UNIT/ML SUSP injection Inject 10 Units into the skin 3 (three) times daily.     metFORMIN (GLUCOPHAGE) 500 MG tablet Take 1,000 mg by mouth 2 (two) times daily with a meal.    metoprolol (LOPRESSOR) 50 MG tablet Take 50 mg by mouth 2 (two) times daily. Refills: 2    Multiple Vitamins-Minerals (PRESERVISION AREDS 2) CAPS Take 1 tablet by mouth 2 (two) times daily.    nitroGLYCERIN (NITROSTAT) 0.4 MG SL tablet Place 1 tablet (0.4 mg total) under the tongue every 5 (five) minutes as needed for chest pain. Max 3 doses. Qty: 25 tablet, Refills: 5    Polyvinyl Alcohol (LUBRICANT DROPS OP) Apply 1 drop to eye daily as needed (dry eyes).      STOP taking these medications     simvastatin (ZOCOR) 20 MG tablet          Brief H and P: For complete details please refer to admission H and P, but in briefDorothy S Fryeis a 82 y.o.femalewith medical history significant for CAD status post DES to LAD in 2007, status post DES for ISR of  LAD in 2011 diabetes, hyperlipidemia, hypertension, recent fall with fracture to the left arm presented to the emergency department chief complaint slurred speech intermittent chest pain.  Hospital Course:     Stroke-like symptoms/TIA:  Patient presented with slurred speech and numbness and tingling of her right arm, currently resolved Neurology was consulted and recommended TIA workup MRI brain showed no acute CVA, intracranial MRA normal, no major vessel occlusion Carotid Dopplers showed normal right and left ICA, minimal wall  plaquing Patient underwent 2D echo on 10/24 which showed EF of 40-45% with grade 1 diastolic dysfunction, dysfunction of the anteroseptum.  Limited echo with Definity contrast was done to rule out any left ventricular thrombus which was negative -PT evaluation recommended no PT follow -Neurology recommended continue dual antiplatelet therapy, control other risk factors -LDL 72, placed on Lipitor    Chronic systolic and diastolic CHF, moderate aortic stenosis -Currently stable, euvolemic, 2D echo showed EF of 40-45% with grade 1 diastolic dysfunction -Continue Lasix, beta-blocker, will benefit from ACEI/ARB as BP allows    Essential hypertension -For now resume Lasix, metoprolol, will benefit from ACEI/ARB as BP allows  Diabetes mellitus -Continue with insulin regimen as per outpatient, hemoglobin A1c 8, recommend better glycemic control  Fracture left arm last week -Currently in cast, follow-up patient with orthopedics  Day of Discharge No complaints except pain in the left wrist  BP (!) 141/81 (BP Location: Right Arm)   Pulse 78   Temp 98.4 F (36.9 C) (Oral)   Resp 16   Ht 5' 6"  (1.676 m)   Wt 92.9 kg (204 lb 11.2 oz)   LMP  (LMP Unknown)   SpO2 99%   BMI 33.04 kg/m   Physical Exam: General: Alert and awake oriented x3 not in any acute distress. HEENT: anicteric sclera, pupils reactive to light and accommodation CVS: S1-S2 clear no murmur rubs or gallops Chest: clear to auscultation bilaterally, no wheezing rales or rhonchi Abdomen: soft nontender, nondistended, normal bowel sounds Extremities: no cyanosis, clubbing or edema noted bilaterally, left arm in cast Neuro: no new FND   The results of significant diagnostics from this hospitalization (including imaging, microbiology, ancillary and laboratory) are listed below for reference.    LAB RESULTS: Basic Metabolic Panel:  Recent Labs Lab 03/07/17 1415  NA 136  K 4.2  CL 103  CO2 23  GLUCOSE 136*  BUN 14   CREATININE 0.69  CALCIUM 9.0   Liver Function Tests: No results for input(s): AST, ALT, ALKPHOS, BILITOT, PROT, ALBUMIN in the last 168 hours. No results for input(s): LIPASE, AMYLASE in the last 168 hours. No results for input(s): AMMONIA in the last 168 hours. CBC:  Recent Labs Lab 03/07/17 1415  WBC 6.0  HGB 11.9*  HCT 36.6  MCV 86.7  PLT 169   Cardiac Enzymes:  Recent Labs Lab 03/07/17 1937 03/08/17 0458  TROPONINI <0.03 <0.03   BNP: Invalid input(s): POCBNP CBG:  Recent Labs Lab 03/09/17 0620 03/09/17 1152  GLUCAP 131* 189*    Significant Diagnostic Studies:  Dg Chest 2 View  Addendum Date: 03/07/2017   ADDENDUM REPORT: 03/07/2017 14:53 ADDENDUM: There is severe degenerative joint disease of the left glenohumeral joint. No fracture is noted. Electronically Signed   By: Marijo Conception, M.D.   On: 03/07/2017 14:53   Result Date: 03/07/2017 CLINICAL DATA:  Chest pain. EXAM: CHEST  2 VIEW COMPARISON:  Radiographs of August 02, 2016. CT scan of August 29, 2016. FINDINGS: The heart size and mediastinal  contours are within normal limits. Stable right upper lobe nodule is noted. No acute pulmonary abnormality is noted. No pneumothorax or pleural effusion is noted. Degenerative changes seen involving the left glenohumeral joint. IMPRESSION: No active cardiopulmonary disease. Electronically Signed: By: Marijo Conception, M.D. On: 03/07/2017 14:06   Ct Head Wo Contrast  Result Date: 03/07/2017 CLINICAL DATA:  82 year old female slipped and fell on wet kitchen floor 03/01/2017. Onset of slurred speech this morning. Initial encounter. EXAM: CT HEAD WITHOUT CONTRAST TECHNIQUE: Contiguous axial images were obtained from the base of the skull through the vertex without intravenous contrast. COMPARISON:  06/09/2016 brain MR. No comparison head CT available for direct review. FINDINGS: Brain: No intracranial hemorrhage or CT evidence of large acute infarct. Prominent dural  calcifications/hyperostosis rather than meningioma suspected. Overall, no intracranial mass lesion noted on this unenhanced exam. Vascular: Vascular calcifications. Skull: No skull fracture. Sinuses/Orbits: No acute orbital abnormality. Visualized paranasal sinuses are clear. Other: Mastoid air cells and middle ear cavities are clear. IMPRESSION: No skull fracture or intracranial hemorrhage. No CT evidence of large acute infarct. Probable hyperostosis frontalis interna and dural calcifications rather than meningiomas. Electronically Signed   By: Genia Del M.D.   On: 03/07/2017 14:05   Mr Brain Wo Contrast  Result Date: 03/07/2017 CLINICAL DATA:  Slurred speech. EXAM: MRI HEAD WITHOUT CONTRAST MRA HEAD WITHOUT CONTRAST TECHNIQUE: Multiplanar, multiecho pulse sequences of the brain and surrounding structures were obtained without intravenous contrast. Angiographic images of the head were obtained using MRA technique without contrast. COMPARISON:  06/09/2016 FINDINGS: MRI HEAD FINDINGS Brain: No acute infarction, hemorrhage, hydrocephalus, extra-axial collection or mass lesion. Mild for age chronic microvascular ischemic type change in the cerebral white matter. Age normal brain volume. T1 hyperintensity in the bilateral basal ganglia, attributed to patient's history of chronic liver disease with cirrhosis. Vascular: Arterial findings below. Normal dural venous sinus flow voids. Skull and upper cervical spine: Negative for marrow lesion. C2-3 ankylosis and C3-4 degenerative facet spurring. Sinuses/Orbits: Negative. Other: Progressively motion degraded study. MRA HEAD FINDINGS Significantly motion degraded. No evidence of major branch occlusion. No convincing proximal flow limiting stenosis. IMPRESSION: Brain MRI: 1. No acute finding or explanation for symptoms. 2. Altered basal ganglia signal attributed to patient's chronic liver disease. Otherwise age normal. Intracranial MRA: Moderately motion degraded. No  major vessel occlusion. No visualized flow limiting stenosis. Electronically Signed   By: Monte Fantasia M.D.   On: 03/07/2017 18:24   Mr Jodene Nam Head Wo Contrast  Result Date: 03/07/2017 CLINICAL DATA:  Slurred speech. EXAM: MRI HEAD WITHOUT CONTRAST MRA HEAD WITHOUT CONTRAST TECHNIQUE: Multiplanar, multiecho pulse sequences of the brain and surrounding structures were obtained without intravenous contrast. Angiographic images of the head were obtained using MRA technique without contrast. COMPARISON:  06/09/2016 FINDINGS: MRI HEAD FINDINGS Brain: No acute infarction, hemorrhage, hydrocephalus, extra-axial collection or mass lesion. Mild for age chronic microvascular ischemic type change in the cerebral white matter. Age normal brain volume. T1 hyperintensity in the bilateral basal ganglia, attributed to patient's history of chronic liver disease with cirrhosis. Vascular: Arterial findings below. Normal dural venous sinus flow voids. Skull and upper cervical spine: Negative for marrow lesion. C2-3 ankylosis and C3-4 degenerative facet spurring. Sinuses/Orbits: Negative. Other: Progressively motion degraded study. MRA HEAD FINDINGS Significantly motion degraded. No evidence of major branch occlusion. No convincing proximal flow limiting stenosis. IMPRESSION: Brain MRI: 1. No acute finding or explanation for symptoms. 2. Altered basal ganglia signal attributed to patient's chronic liver disease. Otherwise age  normal. Intracranial MRA: Moderately motion degraded. No major vessel occlusion. No visualized flow limiting stenosis. Electronically Signed   By: Monte Fantasia M.D.   On: 03/07/2017 18:24    2D ECHO: Left ventricle: The cavity size was normal. Systolic function was   mildly to moderately reduced. The estimated ejection fraction was   in the range of 40% to 45%. There is akinesis of the anteroseptal   myocardium. Doppler parameters are consistent with abnormal left   ventricular relaxation (grade 1  diastolic dysfunction). - Aortic valve: Valve mobility was restricted. There was moderate   stenosis. There was trivial regurgitation. - Mitral valve: Severely calcified annulus. There was mild   regurgitation.  Impressions:  - Definity used; akinesis of the anteroseptal wall with overall   mild to moderate LV dysfunction; mild diastolic dysfunction;   calcified aortic valve with probable moderate AS; trace AI; mild   MR; negative saline microcavitation study.  Disposition and Follow-up: Discharge Instructions    Diet Carb Modified    Complete by:  As directed    Increase activity slowly    Complete by:  As directed        DISPOSITION: home    [place]    [name], MD. Schedule an appointment as soon as possible for a visit in 2 week(s).   Specialty:  Internal Medicine Contact information: [place] [address] [phone]        Rosalin Hawking, MD. Schedule an appointment as soon as possible for a visit in 6 week(s).   Specialty:  Neurology Contact information: [place]  [id number] [phone]            Time spent on Discharge: 84mns   Signed:   REstill CottaM.D. [facility] 03/09/2017, 2:44 PM Pager: [phone]

## 2017-03-09 NOTE — Progress Notes (Signed)
  Echocardiogram 2D Echocardiogram has been performed.  Tonya Hunter G Schneur Crowson 03/09/2017, 1:08 PM

## 2017-03-10 DIAGNOSIS — S52532D Colles' fracture of left radius, subsequent encounter for closed fracture with routine healing: Secondary | ICD-10-CM | POA: Diagnosis not present
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
## 2017-03-14 ENCOUNTER — Encounter (INDEPENDENT_AMBULATORY_CARE_PROVIDER_SITE_OTHER): Payer: Self-pay

## 2017-03-14 ENCOUNTER — Encounter: Payer: Self-pay | Admitting: Cardiovascular Disease

## 2017-03-14 ENCOUNTER — Ambulatory Visit (INDEPENDENT_AMBULATORY_CARE_PROVIDER_SITE_OTHER): Payer: Medicare Other | Admitting: Cardiovascular Disease

## 2017-03-14 VITALS — BP 118/60 | HR 68 | Ht 66.0 in | Wt 197.0 lb

## 2017-03-14 DIAGNOSIS — I2510 Atherosclerotic heart disease of native coronary artery without angina pectoris: Secondary | ICD-10-CM

## 2017-03-14 DIAGNOSIS — I350 Nonrheumatic aortic (valve) stenosis: Secondary | ICD-10-CM | POA: Diagnosis not present

## 2017-03-14 NOTE — Patient Instructions (Addendum)
Medication Instructions:  Your physician has recommended you make the following change in your medication:  1-Decrease Metoprolol 25 mg (1/2 tablet) by mouth twice daily  Labwork: NONE  Testing/Procedures: NONE  Follow-Up: Your physician wants you to follow-up in: 6 months with Dr. Johnsie Cancel. You will receive a reminder letter in the mail two months in advance. If you don't receive a letter, please call our office to schedule the follow-up appointment.   If you need a refill on your cardiac medications before your next appointment, please call your pharmacy.

## 2017-03-16 DIAGNOSIS — M25532 Pain in left wrist: Secondary | ICD-10-CM | POA: Diagnosis not present

## 2017-03-16 DIAGNOSIS — S52532D Colles' fracture of left radius, subsequent encounter for closed fracture with routine healing: Secondary | ICD-10-CM | POA: Diagnosis not present

## 2017-03-20 DIAGNOSIS — S52532D Colles' fracture of left radius, subsequent encounter for closed fracture with routine healing: Secondary | ICD-10-CM | POA: Diagnosis not present

## 2017-03-21 DIAGNOSIS — H353131 Nonexudative age-related macular degeneration, bilateral, early dry stage: Secondary | ICD-10-CM | POA: Diagnosis not present

## 2017-03-21 DIAGNOSIS — E113411 Type 2 diabetes mellitus with severe nonproliferative diabetic retinopathy with macular edema, right eye: Secondary | ICD-10-CM | POA: Diagnosis not present

## 2017-03-21 DIAGNOSIS — E113412 Type 2 diabetes mellitus with severe nonproliferative diabetic retinopathy with macular edema, left eye: Secondary | ICD-10-CM | POA: Diagnosis not present

## 2017-03-25 DIAGNOSIS — S52532D Colles' fracture of left radius, subsequent encounter for closed fracture with routine healing: Secondary | ICD-10-CM | POA: Diagnosis not present

## 2017-03-31 DIAGNOSIS — S52532D Colles' fracture of left radius, subsequent encounter for closed fracture with routine healing: Secondary | ICD-10-CM | POA: Diagnosis not present

## 2017-04-03 DIAGNOSIS — G6289 Other specified polyneuropathies: Secondary | ICD-10-CM | POA: Diagnosis not present

## 2017-04-03 DIAGNOSIS — E559 Vitamin D deficiency, unspecified: Secondary | ICD-10-CM | POA: Diagnosis not present

## 2017-04-03 DIAGNOSIS — G629 Polyneuropathy, unspecified: Secondary | ICD-10-CM | POA: Diagnosis not present

## 2017-04-03 DIAGNOSIS — G459 Transient cerebral ischemic attack, unspecified: Secondary | ICD-10-CM | POA: Diagnosis not present

## 2017-04-03 DIAGNOSIS — R0789 Other chest pain: Secondary | ICD-10-CM | POA: Diagnosis not present

## 2017-04-03 DIAGNOSIS — E538 Deficiency of other specified B group vitamins: Secondary | ICD-10-CM | POA: Diagnosis not present

## 2017-04-03 DIAGNOSIS — R531 Weakness: Secondary | ICD-10-CM | POA: Diagnosis not present

## 2017-04-03 DIAGNOSIS — R82998 Other abnormal findings in urine: Secondary | ICD-10-CM | POA: Diagnosis not present

## 2017-04-03 DIAGNOSIS — Z6828 Body mass index (BMI) 28.0-28.9, adult: Secondary | ICD-10-CM | POA: Diagnosis not present

## 2017-04-03 DIAGNOSIS — Z0000 Encounter for general adult medical examination without abnormal findings: Secondary | ICD-10-CM | POA: Diagnosis not present

## 2017-04-03 DIAGNOSIS — Z9861 Coronary angioplasty status: Secondary | ICD-10-CM | POA: Diagnosis not present

## 2017-04-03 DIAGNOSIS — I8501 Esophageal varices with bleeding: Secondary | ICD-10-CM | POA: Diagnosis not present

## 2017-04-03 DIAGNOSIS — I10 Essential (primary) hypertension: Secondary | ICD-10-CM | POA: Diagnosis not present

## 2017-04-03 DIAGNOSIS — E782 Mixed hyperlipidemia: Secondary | ICD-10-CM | POA: Diagnosis not present

## 2017-04-03 DIAGNOSIS — E038 Other specified hypothyroidism: Secondary | ICD-10-CM | POA: Diagnosis not present

## 2017-04-03 DIAGNOSIS — E1159 Type 2 diabetes mellitus with other circulatory complications: Secondary | ICD-10-CM | POA: Diagnosis not present

## 2017-04-08 DIAGNOSIS — Z6830 Body mass index (BMI) 30.0-30.9, adult: Secondary | ICD-10-CM | POA: Diagnosis not present

## 2017-04-08 DIAGNOSIS — E782 Mixed hyperlipidemia: Secondary | ICD-10-CM | POA: Diagnosis not present

## 2017-04-08 DIAGNOSIS — I10 Essential (primary) hypertension: Secondary | ICD-10-CM | POA: Diagnosis not present

## 2017-04-08 DIAGNOSIS — G459 Transient cerebral ischemic attack, unspecified: Secondary | ICD-10-CM | POA: Diagnosis not present

## 2017-04-08 DIAGNOSIS — R0789 Other chest pain: Secondary | ICD-10-CM | POA: Diagnosis not present

## 2017-04-08 DIAGNOSIS — E1159 Type 2 diabetes mellitus with other circulatory complications: Secondary | ICD-10-CM | POA: Diagnosis not present

## 2017-04-08 DIAGNOSIS — E038 Other specified hypothyroidism: Secondary | ICD-10-CM | POA: Diagnosis not present

## 2017-04-08 DIAGNOSIS — Z9861 Coronary angioplasty status: Secondary | ICD-10-CM | POA: Diagnosis not present

## 2017-04-10 DIAGNOSIS — E538 Deficiency of other specified B group vitamins: Secondary | ICD-10-CM | POA: Diagnosis not present

## 2017-04-14 DIAGNOSIS — S52532D Colles' fracture of left radius, subsequent encounter for closed fracture with routine healing: Secondary | ICD-10-CM | POA: Diagnosis not present

## 2017-04-25 DIAGNOSIS — S52532D Colles' fracture of left radius, subsequent encounter for closed fracture with routine healing: Secondary | ICD-10-CM | POA: Diagnosis not present

## 2017-04-25 DIAGNOSIS — M25532 Pain in left wrist: Secondary | ICD-10-CM | POA: Diagnosis not present

## 2017-04-26 ENCOUNTER — Ambulatory Visit: Payer: Self-pay | Admitting: Neurology

## 2017-04-27 ENCOUNTER — Encounter: Payer: Self-pay | Admitting: Neurology

## 2017-05-02 DIAGNOSIS — M79642 Pain in left hand: Secondary | ICD-10-CM | POA: Diagnosis not present

## 2017-05-02 DIAGNOSIS — M25532 Pain in left wrist: Secondary | ICD-10-CM | POA: Diagnosis not present

## 2017-05-11 DIAGNOSIS — E538 Deficiency of other specified B group vitamins: Secondary | ICD-10-CM | POA: Diagnosis not present

## 2017-05-22 DIAGNOSIS — S52532D Colles' fracture of left radius, subsequent encounter for closed fracture with routine healing: Secondary | ICD-10-CM | POA: Diagnosis not present

## 2017-06-05 DIAGNOSIS — E113412 Type 2 diabetes mellitus with severe nonproliferative diabetic retinopathy with macular edema, left eye: Secondary | ICD-10-CM | POA: Diagnosis not present

## 2017-06-05 DIAGNOSIS — H43813 Vitreous degeneration, bilateral: Secondary | ICD-10-CM | POA: Diagnosis not present

## 2017-06-05 DIAGNOSIS — E113411 Type 2 diabetes mellitus with severe nonproliferative diabetic retinopathy with macular edema, right eye: Secondary | ICD-10-CM | POA: Diagnosis not present

## 2017-06-05 DIAGNOSIS — H353131 Nonexudative age-related macular degeneration, bilateral, early dry stage: Secondary | ICD-10-CM | POA: Diagnosis not present

## 2017-06-12 DIAGNOSIS — S6292XA Unspecified fracture of left wrist and hand, initial encounter for closed fracture: Secondary | ICD-10-CM | POA: Diagnosis not present

## 2017-06-13 DIAGNOSIS — Z85828 Personal history of other malignant neoplasm of skin: Secondary | ICD-10-CM | POA: Diagnosis not present

## 2017-06-13 DIAGNOSIS — L570 Actinic keratosis: Secondary | ICD-10-CM | POA: Diagnosis not present

## 2017-06-13 DIAGNOSIS — E538 Deficiency of other specified B group vitamins: Secondary | ICD-10-CM | POA: Diagnosis not present

## 2017-06-21 ENCOUNTER — Ambulatory Visit (INDEPENDENT_AMBULATORY_CARE_PROVIDER_SITE_OTHER): Payer: Medicare Other | Admitting: Diagnostic Neuroimaging

## 2017-06-21 ENCOUNTER — Encounter: Payer: Self-pay | Admitting: Diagnostic Neuroimaging

## 2017-06-21 VITALS — BP 123/68 | HR 68 | Ht 66.0 in | Wt 198.8 lb

## 2017-06-21 DIAGNOSIS — I10 Essential (primary) hypertension: Secondary | ICD-10-CM

## 2017-06-21 DIAGNOSIS — E1141 Type 2 diabetes mellitus with diabetic mononeuropathy: Secondary | ICD-10-CM | POA: Diagnosis not present

## 2017-06-21 DIAGNOSIS — E785 Hyperlipidemia, unspecified: Secondary | ICD-10-CM

## 2017-06-21 DIAGNOSIS — Z794 Long term (current) use of insulin: Secondary | ICD-10-CM

## 2017-06-21 DIAGNOSIS — G459 Transient cerebral ischemic attack, unspecified: Secondary | ICD-10-CM | POA: Diagnosis not present

## 2017-06-21 NOTE — Patient Instructions (Addendum)
-You do not have to continue taking cholesterol medication at this time as your PCP has stopped it. From a stroke stand point, we recommend having your cholesterol levels checked every 3-6 months to ensure your levels maintain in a good range.   -continue aspirin 41m and plavix 766mper your cardiologist  -monitor blood pressure at home and maintain blood pressure less than 130/80  -increase daily exercise and maintain healthy diet  -follow up with [facility] needed    Stroke Prevention Some health problems and behaviors may make it more likely for you to have a stroke. Below are ways to lessen your risk of having a stroke.  Be active for at least 30 minutes on most or all days.  Do not smoke. Try not to be around others who smoke.  Do not drink too much alcohol. ? Do not have more than 2 drinks a day if you are a man. ? Do not have more than 1 drink a day if you are a woman and are not pregnant.  Eat healthy foods, such as fruits and vegetables. If you were put on a specific diet, follow the diet as told.  Keep your cholesterol levels under control through diet and medicines. Look for foods that are low in saturated fat, trans fat, cholesterol, and are high in fiber.  If you have diabetes, follow all diet plans and take your medicine as told.  Ask your doctor if you need treatment to lower your blood pressure. If you have high blood pressure (hypertension), follow all diet plans and take your medicine as told by your doctor.  If you are 1888950ears old, have your blood pressure checked every 3-5 years. If you are age 5371r older, have your blood pressure checked every year.  Keep a healthy weight. Eat foods that are low in calories, salt, saturated fat, trans fat, and cholesterol.  Do not take drugs.  Avoid birth control pills, if this applies. Talk to your doctor about the risks of taking birth control pills.  Talk to your doctor if you have sleep problems (sleep apnea).  Take  all medicine as told by your doctor. ? You may be told to take aspirin or blood thinner medicine. Take this medicine as told by your doctor. ? Understand your medicine instructions.  Make sure any other conditions you have are being taken care of.  Get help right away if:  You suddenly lose feeling (you feel numb) or have weakness in your face, arm, or leg.  Your face or eyelid hangs down to one side.  You suddenly feel confused.  You have trouble talking (aphasia) or understanding what people are saying.  You suddenly have trouble seeing in one or both eyes.  You suddenly have trouble walking.  You are dizzy.  You lose your balance or your movements are clumsy (uncoordinated).  You suddenly have a very bad headache and you do not know the cause.  You have new chest pain.  Your heart feels like it is fluttering or skipping a beat (irregular heartbeat). Do not wait to see if the symptoms above go away. Get help right away. Call your local emergency services (911 in [place] Do not drive yourself to the hospital. This information is not intended to replace advice given to you by your health care provider. Make sure you discuss any questions you have with your health care provider. Document Released: 11/01/2011 Document Revised: 10/08/2015 Document Reviewed: 11/02/2012 Elsevier Interactive Patient Education  [place] Compensation Strategies  1. Use "WARM" strategy.  W= write it down  A= associate it  R= repeat it  M= make a mental note  2.   You can keep a Social worker.  Use a 3-ring notebook with sections for the following: calendar, important names and phone numbers,  medications, doctors' names/phone numbers, lists/reminders, and a section to journal what you did  each day.   3.    Use a calendar to write appointments down.  4.    Write yourself a schedule for the day.  This can be placed on the calendar or in a separate section of the Memory  Notebook.  Keeping a  regular schedule can help memory.  5.    Use medication organizer with sections for each day or morning/evening pills.  You may need help loading it  6.    Keep a basket, or pegboard by the door.  Place items that you need to take out with you in the basket or on the pegboard.  You may also want to  include a message board for reminders.  7.    Use sticky notes.  Place sticky notes with reminders in a place where the task is performed.  For example: " turn off the  stove" placed by the stove, "lock the door" placed on the door at eye level, " take your medications" on  the bathroom mirror or by the place where you normally take your medications.  8.    Use alarms/timers.  Use while cooking to remind yourself to check on food or as a reminder to take your medicine, or as a  reminder to make a call, or as a reminder to perform another task, etc.
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
## 2017-07-10 DIAGNOSIS — S52532D Colles' fracture of left radius, subsequent encounter for closed fracture with routine healing: Secondary | ICD-10-CM | POA: Diagnosis not present

## 2017-07-12 DIAGNOSIS — E538 Deficiency of other specified B group vitamins: Secondary | ICD-10-CM | POA: Diagnosis not present

## 2017-07-31 DIAGNOSIS — H903 Sensorineural hearing loss, bilateral: Secondary | ICD-10-CM | POA: Diagnosis not present

## 2017-07-31 DIAGNOSIS — H6122 Impacted cerumen, left ear: Secondary | ICD-10-CM | POA: Diagnosis not present

## 2017-08-07 DIAGNOSIS — E538 Deficiency of other specified B group vitamins: Secondary | ICD-10-CM | POA: Diagnosis not present

## 2017-08-07 DIAGNOSIS — S52532D Colles' fracture of left radius, subsequent encounter for closed fracture with routine healing: Secondary | ICD-10-CM | POA: Diagnosis not present

## 2017-08-22 DIAGNOSIS — D692 Other nonthrombocytopenic purpura: Secondary | ICD-10-CM | POA: Diagnosis not present

## 2017-08-22 DIAGNOSIS — L570 Actinic keratosis: Secondary | ICD-10-CM | POA: Diagnosis not present

## 2017-08-22 DIAGNOSIS — Z85828 Personal history of other malignant neoplasm of skin: Secondary | ICD-10-CM | POA: Diagnosis not present

## 2017-08-22 DIAGNOSIS — L821 Other seborrheic keratosis: Secondary | ICD-10-CM | POA: Diagnosis not present

## 2017-08-22 DIAGNOSIS — L718 Other rosacea: Secondary | ICD-10-CM | POA: Diagnosis not present

## 2017-09-15 DIAGNOSIS — S52532D Colles' fracture of left radius, subsequent encounter for closed fracture with routine healing: Secondary | ICD-10-CM | POA: Diagnosis not present

## 2017-09-26 DIAGNOSIS — Z85828 Personal history of other malignant neoplasm of skin: Secondary | ICD-10-CM | POA: Diagnosis not present

## 2017-09-26 DIAGNOSIS — L738 Other specified follicular disorders: Secondary | ICD-10-CM | POA: Diagnosis not present

## 2017-10-11 ENCOUNTER — Encounter (INDEPENDENT_AMBULATORY_CARE_PROVIDER_SITE_OTHER): Payer: Self-pay

## 2017-10-11 ENCOUNTER — Encounter: Payer: Self-pay | Admitting: Cardiovascular Disease

## 2017-10-11 ENCOUNTER — Ambulatory Visit (INDEPENDENT_AMBULATORY_CARE_PROVIDER_SITE_OTHER): Payer: Medicare Other | Admitting: Cardiovascular Disease

## 2017-10-11 VITALS — BP 128/68 | HR 80 | Ht 66.0 in | Wt 206.2 lb

## 2017-10-11 DIAGNOSIS — I10 Essential (primary) hypertension: Secondary | ICD-10-CM | POA: Diagnosis not present

## 2017-10-11 DIAGNOSIS — I480 Paroxysmal atrial fibrillation: Secondary | ICD-10-CM

## 2017-10-11 DIAGNOSIS — R0989 Other specified symptoms and signs involving the circulatory and respiratory systems: Secondary | ICD-10-CM | POA: Diagnosis not present

## 2017-10-11 DIAGNOSIS — I350 Nonrheumatic aortic (valve) stenosis: Secondary | ICD-10-CM
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
## 2017-10-11 NOTE — Patient Instructions (Addendum)
Medication Instructions:  Your physician recommends that you continue on your current medications as directed. Please refer to the Current Medication list given to you today.  Labwork: NONE  Testing/Procedures: Your physician has requested that you have an echocardiogram in October. Echocardiography is a painless test that uses sound waves to create images of your heart. It provides your doctor with information about the size and shape of your heart and how well your heart's chambers and valves are working. This procedure takes approximately one hour. There are no restrictions for this procedure.  Follow-Up: Your physician wants you to follow-up in: 6 months with Dr. Johnsie Cancel. You will receive a reminder letter in the mail two months in advance. If you don't receive a letter, please call our office to schedule the follow-up appointment.   If you need a refill on your cardiac medications before your next appointment, please call your pharmacy.

## 2017-10-16 DIAGNOSIS — E113412 Type 2 diabetes mellitus with severe nonproliferative diabetic retinopathy with macular edema, left eye: Secondary | ICD-10-CM | POA: Diagnosis not present

## 2017-10-16 DIAGNOSIS — E113411 Type 2 diabetes mellitus with severe nonproliferative diabetic retinopathy with macular edema, right eye: Secondary | ICD-10-CM | POA: Diagnosis not present

## 2017-10-16 DIAGNOSIS — H353131 Nonexudative age-related macular degeneration, bilateral, early dry stage: Secondary | ICD-10-CM | POA: Diagnosis not present

## 2017-10-16 DIAGNOSIS — H43813 Vitreous degeneration, bilateral: Secondary | ICD-10-CM | POA: Diagnosis not present

## 2017-10-18 ENCOUNTER — Other Ambulatory Visit (HOSPITAL_COMMUNITY): Payer: Self-pay

## 2017-11-17 DIAGNOSIS — S52532G Colles' fracture of left radius, subsequent encounter for closed fracture with delayed healing: Secondary | ICD-10-CM | POA: Diagnosis not present

## 2017-12-06 ENCOUNTER — Other Ambulatory Visit: Payer: Self-pay | Admitting: Gastroenterology

## 2017-12-06 DIAGNOSIS — K7460 Unspecified cirrhosis of liver: Secondary | ICD-10-CM | POA: Diagnosis not present

## 2017-12-06 DIAGNOSIS — R1310 Dysphagia, unspecified: Secondary | ICD-10-CM | POA: Diagnosis not present

## 2017-12-06 DIAGNOSIS — R110 Nausea: Secondary | ICD-10-CM | POA: Diagnosis not present

## 2017-12-06 DIAGNOSIS — K625 Hemorrhage of anus and rectum: Secondary | ICD-10-CM | POA: Diagnosis not present

## 2017-12-06 DIAGNOSIS — R1319 Other dysphagia: Secondary | ICD-10-CM

## 2017-12-06 DIAGNOSIS — K766 Portal hypertension: Secondary | ICD-10-CM | POA: Diagnosis not present

## 2017-12-11 DIAGNOSIS — K766 Portal hypertension: Secondary | ICD-10-CM | POA: Diagnosis not present

## 2017-12-11 DIAGNOSIS — D126 Benign neoplasm of colon, unspecified: Secondary | ICD-10-CM | POA: Diagnosis not present

## 2017-12-11 DIAGNOSIS — K3189 Other diseases of stomach and duodenum: Secondary | ICD-10-CM | POA: Diagnosis not present

## 2017-12-11 DIAGNOSIS — K2980 Duodenitis without bleeding: Secondary | ICD-10-CM | POA: Diagnosis not present

## 2017-12-11 DIAGNOSIS — I8500 Esophageal varices without bleeding: Secondary | ICD-10-CM | POA: Diagnosis not present

## 2017-12-11 DIAGNOSIS — K2930 Chronic superficial gastritis without bleeding: Secondary | ICD-10-CM | POA: Diagnosis not present

## 2017-12-11 DIAGNOSIS — K224 Dyskinesia of esophagus: Secondary | ICD-10-CM | POA: Diagnosis not present

## 2017-12-11 DIAGNOSIS — K625 Hemorrhage of anus and rectum: Secondary | ICD-10-CM | POA: Diagnosis not present

## 2017-12-14 DIAGNOSIS — D126 Benign neoplasm of colon, unspecified: Secondary | ICD-10-CM | POA: Diagnosis not present

## 2017-12-14 DIAGNOSIS — K2980 Duodenitis without bleeding: Secondary | ICD-10-CM | POA: Diagnosis not present

## 2017-12-14 DIAGNOSIS — K2930 Chronic superficial gastritis without bleeding: Secondary | ICD-10-CM | POA: Diagnosis not present

## 2017-12-15 ENCOUNTER — Other Ambulatory Visit: Payer: Self-pay

## 2017-12-18 ENCOUNTER — Ambulatory Visit
Admission: RE | Admit: 2017-12-18 | Discharge: 2017-12-18 | Disposition: A | Discharge status: Home / Self Care | Payer: Medicare Other | Source: Ambulatory Visit | Attending: Gastroenterology | Admitting: Gastroenterology

## 2017-12-18 DIAGNOSIS — K7460 Unspecified cirrhosis of liver: Secondary | ICD-10-CM

## 2017-12-18 DIAGNOSIS — R1310 Dysphagia, unspecified: Secondary | ICD-10-CM | POA: Diagnosis not present

## 2017-12-18 DIAGNOSIS — R1319 Other dysphagia: Secondary | ICD-10-CM

## 2017-12-25 DIAGNOSIS — I8501 Esophageal varices with bleeding: Secondary | ICD-10-CM | POA: Diagnosis not present

## 2017-12-25 DIAGNOSIS — R160 Hepatomegaly, not elsewhere classified: Secondary | ICD-10-CM | POA: Diagnosis not present

## 2017-12-25 DIAGNOSIS — R945 Abnormal results of liver function studies: Secondary | ICD-10-CM | POA: Diagnosis not present

## 2017-12-25 DIAGNOSIS — G629 Polyneuropathy, unspecified: Secondary | ICD-10-CM | POA: Diagnosis not present

## 2017-12-25 DIAGNOSIS — G459 Transient cerebral ischemic attack, unspecified: Secondary | ICD-10-CM | POA: Diagnosis not present

## 2017-12-25 DIAGNOSIS — E1159 Type 2 diabetes mellitus with other circulatory complications: Secondary | ICD-10-CM | POA: Diagnosis not present

## 2017-12-25 DIAGNOSIS — I872 Venous insufficiency (chronic) (peripheral): Secondary | ICD-10-CM | POA: Diagnosis not present

## 2017-12-25 DIAGNOSIS — E559 Vitamin D deficiency, unspecified: Secondary | ICD-10-CM | POA: Diagnosis not present

## 2017-12-25 DIAGNOSIS — E782 Mixed hyperlipidemia: Secondary | ICD-10-CM | POA: Diagnosis not present

## 2017-12-25 DIAGNOSIS — K7460 Unspecified cirrhosis of liver: Secondary | ICD-10-CM | POA: Diagnosis not present

## 2017-12-25 DIAGNOSIS — I10 Essential (primary) hypertension: Secondary | ICD-10-CM | POA: Diagnosis not present

## 2017-12-25 DIAGNOSIS — R079 Chest pain, unspecified: Secondary | ICD-10-CM | POA: Diagnosis not present

## 2018-01-16 DIAGNOSIS — R110 Nausea: Secondary | ICD-10-CM | POA: Diagnosis not present

## 2018-01-16 DIAGNOSIS — K625 Hemorrhage of anus and rectum: Secondary | ICD-10-CM | POA: Diagnosis not present

## 2018-01-16 DIAGNOSIS — K766 Portal hypertension: Secondary | ICD-10-CM | POA: Diagnosis not present

## 2018-01-16 DIAGNOSIS — R1310 Dysphagia, unspecified: Secondary | ICD-10-CM | POA: Diagnosis not present

## 2018-01-16 DIAGNOSIS — K7469 Other cirrhosis of liver: Secondary | ICD-10-CM | POA: Diagnosis not present

## 2018-01-19 DIAGNOSIS — S52532D Colles' fracture of left radius, subsequent encounter for closed fracture with routine healing: Secondary | ICD-10-CM | POA: Diagnosis not present

## 2018-01-29 DIAGNOSIS — H353131 Nonexudative age-related macular degeneration, bilateral, early dry stage: Secondary | ICD-10-CM | POA: Diagnosis not present

## 2018-01-29 DIAGNOSIS — H43812 Vitreous degeneration, left eye: Secondary | ICD-10-CM | POA: Diagnosis not present

## 2018-01-29 DIAGNOSIS — H3561 Retinal hemorrhage, right eye: Secondary | ICD-10-CM | POA: Diagnosis not present

## 2018-01-29 DIAGNOSIS — E113412 Type 2 diabetes mellitus with severe nonproliferative diabetic retinopathy with macular edema, left eye: Secondary | ICD-10-CM | POA: Diagnosis not present

## 2018-01-29 DIAGNOSIS — E113411 Type 2 diabetes mellitus with severe nonproliferative diabetic retinopathy with macular edema, right eye: Secondary | ICD-10-CM | POA: Diagnosis not present

## 2018-02-19 ENCOUNTER — Ambulatory Visit (HOSPITAL_COMMUNITY): Payer: Medicare Other | Attending: Cardiovascular Disease

## 2018-02-19 ENCOUNTER — Other Ambulatory Visit: Payer: Self-pay

## 2018-02-19 ENCOUNTER — Telehealth: Payer: Self-pay

## 2018-02-19 DIAGNOSIS — M79605 Pain in left leg: Principal | ICD-10-CM

## 2018-02-19 DIAGNOSIS — I350 Nonrheumatic aortic (valve) stenosis: Secondary | ICD-10-CM

## 2018-02-19 DIAGNOSIS — M79604 Pain in right leg: Secondary | ICD-10-CM

## 2018-02-19 NOTE — Telephone Encounter (Signed)
Called patient with echo results. Per Dr. Johnsie Cancel, EF lower but cath last year ok AS remains non severe. F/u CHF clinic or Pharm D to start low dose entresto f/u with me 3 months will need BMET.     Patient will come in on 10/22 to see Pharm D to start low dose Entresto. Patient has appt in January for 3 month f/u. Patient wanted to know if she could have the Lower Extremity Duplex to see if she has PAD, Patient stated Dr. Johnsie Cancel had discussed this with her at her last office visit. Informed patient that Dr. Fletcher Anon had reviewed her test from last year. Per Dr. Fletcher Anon " I reviewed her studies. She has diffuse calcified disease which does not seem to be obstructive. I do not think her symptoms are related to peripheral arterial disease based on these studies." Patient stated that if she does have PAD, Dr. Johnsie Cancel was going to put her on a medication. Informed patient that a message would be sent to Dr. Johnsie Cancel to see if he wants to get a BLE duplex this year and go from there. Patient verbalized understanding and will look forward to his response.

## 2018-02-19 NOTE — Telephone Encounter (Signed)
-----   Message from Josue Hector, MD sent at 02/19/2018  4:15 PM EDT ----- EF lower but cath last year ok AS remains non severe. F/u CHF clinic or Pharm D to start low dose entresto f/u with me 3 months will need BMET

## 2018-02-20 ENCOUNTER — Telehealth: Payer: Self-pay | Admitting: Cardiovascular Disease

## 2018-02-20 ENCOUNTER — Other Ambulatory Visit: Payer: Self-pay | Admitting: Cardiovascular Disease

## 2018-02-20 DIAGNOSIS — M79605 Pain in left leg: Principal | ICD-10-CM

## 2018-02-20 DIAGNOSIS — M79604 Pain in right leg: Secondary | ICD-10-CM

## 2018-02-20 NOTE — Telephone Encounter (Signed)
Dr Fletcher Anon can handle the PVD he is the expert for PAD

## 2018-02-20 NOTE — Telephone Encounter (Signed)
Sounds like a plan

## 2018-02-20 NOTE — Telephone Encounter (Signed)
Patient has never seen Dr. Fletcher Anon. Tonya Kicks NP had asked Dr. Fletcher Anon to look at the study last year. Would you like me to reorder the study she had last year and then refer her to Dr. Fletcher Anon?

## 2018-02-20 NOTE — Telephone Encounter (Signed)
Placed order for test and referral. Made patient appointment for Lower extremity duplex on 02/21/18 and f/u with Dr. Fletcher Anon on 02/27/18. Called patient with appointment times and location. Patient verbalized understanding and will keep these appointments.

## 2018-02-21 ENCOUNTER — Ambulatory Visit (HOSPITAL_COMMUNITY)
Admission: RE | Admit: 2018-02-21 | Discharge: 2018-02-21 | Disposition: A | Discharge status: Home / Self Care | Payer: Medicare Other | Source: Ambulatory Visit | Attending: Internal Medicine | Admitting: Internal Medicine

## 2018-02-21 DIAGNOSIS — M79604 Pain in right leg: Secondary | ICD-10-CM | POA: Diagnosis not present

## 2018-02-21 DIAGNOSIS — M79605 Pain in left leg: Secondary | ICD-10-CM

## 2018-02-27 ENCOUNTER — Encounter: Payer: Self-pay | Admitting: Cardiovascular Disease

## 2018-02-27 ENCOUNTER — Ambulatory Visit (INDEPENDENT_AMBULATORY_CARE_PROVIDER_SITE_OTHER): Payer: Medicare Other | Admitting: Cardiovascular Disease

## 2018-02-27 VITALS — BP 129/64 | HR 80 | Ht 66.0 in | Wt 206.8 lb

## 2018-02-27 DIAGNOSIS — I5022 Chronic systolic (congestive) heart failure: Secondary | ICD-10-CM

## 2018-02-27 DIAGNOSIS — R29898 Other symptoms and signs involving the musculoskeletal system: Secondary | ICD-10-CM

## 2018-02-27 DIAGNOSIS — I2510 Atherosclerotic heart disease of native coronary artery without angina pectoris: Secondary | ICD-10-CM

## 2018-02-27 NOTE — Patient Instructions (Signed)
Medication Instructions:  No changes  If you need a refill on your cardiac medications before your next appointment, please call your pharmacy.   Follow-Up: As needed with Dr. Fletcher Anon.
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
## 2018-02-28 DIAGNOSIS — E038 Other specified hypothyroidism: Secondary | ICD-10-CM | POA: Diagnosis not present

## 2018-03-06 ENCOUNTER — Encounter: Payer: Self-pay | Admitting: Pharmacist

## 2018-03-06 ENCOUNTER — Ambulatory Visit (INDEPENDENT_AMBULATORY_CARE_PROVIDER_SITE_OTHER): Payer: Medicare Other | Admitting: Pharmacist

## 2018-03-06 VITALS — BP 122/68 | HR 72

## 2018-03-06 DIAGNOSIS — I10 Essential (primary) hypertension: Secondary | ICD-10-CM | POA: Diagnosis not present

## 2018-03-06 DIAGNOSIS — I2510 Atherosclerotic heart disease of native coronary artery without angina pectoris: Secondary | ICD-10-CM

## 2018-03-06 LAB — BASIC METABOLIC PANEL
BUN/Creatinine Ratio: 22 (ref 12–28)
BUN: 22 mg/dL (ref 8–27)
CALCIUM: 9.3 mg/dL (ref 8.7–10.3)
CHLORIDE: 99 mmol/L (ref 96–106)
CO2: 21 mmol/L (ref 20–29)
Creatinine, Ser: 1 mg/dL (ref 0.57–1.00)
GFR calc Af Amer: 61 mL/min/{1.73_m2} (ref >59)
GFR calc non Af Amer: 53 mL/min/{1.73_m2} — ABNORMAL LOW (ref >59)
GLUCOSE: 156 mg/dL — AB (ref 65–99)
Potassium: 4 mmol/L (ref 3.5–5.2)
Sodium: 139 mmol/L (ref 134–144)

## 2018-03-06 MED ORDER — METOPROLOL SUCCINATE ER 50 MG PO TB24: 50.0000 mg | ORAL_TABLET | Freq: Every day | ORAL | 3 refills | Status: DC
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
## 2018-03-06 NOTE — Patient Instructions (Addendum)
Return for a follow up appointment in 2-3 weeks  Go to the lab today for blood work (BMET)  Check your blood pressure at home daily (if able) and keep record of the readings.  Take your BP meds as follows: START Entresto 24/60m TWICE DAILY (about 12 hours apart) - call the insurance to determine if this will be cost-effective for you  START Metoprolol SUCCINATE 516m(1 tablet) ONCE DAILY once your complete your supply of metoprolol tartrate  Bring all of your meds, your BP cuff and your record of home blood pressures to your next appointment.  Exercise as you're able, try to walk approximately 30 minutes per day.  Keep salt intake to a minimum, especially watch canned and prepared boxed foods.  Eat more fresh fruits and vegetables and fewer canned items.  Avoid eating in fast food restaurants.    HOW TO TAKE YOUR BLOOD PRESSURE: . Rest 5 minutes before taking your blood pressure. .  Don't smoke or drink caffeinated beverages for at least 30 minutes before. . Take your blood pressure before (not after) you eat. . Sit comfortably with your back supported and both feet on the floor (don't cross your legs). . Elevate your arm to heart level on a table or a desk. . Use the proper sized cuff. It should fit smoothly and snugly around your bare upper arm. There should be enough room to slip a fingertip under the cuff. The bottom edge of the cuff should be 1 inch above the crease of the elbow. . Ideally, take 3 measurements at one sitting and record the average.

## 2018-03-09 DIAGNOSIS — R739 Hyperglycemia, unspecified: Secondary | ICD-10-CM | POA: Diagnosis not present

## 2018-03-09 DIAGNOSIS — I959 Hypotension, unspecified: Secondary | ICD-10-CM | POA: Diagnosis not present

## 2018-03-09 DIAGNOSIS — R42 Dizziness and giddiness: Secondary | ICD-10-CM | POA: Diagnosis not present

## 2018-03-12 ENCOUNTER — Telehealth: Payer: Self-pay | Admitting: Pharmacist

## 2018-03-12 NOTE — Telephone Encounter (Signed)
Pt reports that she had a bad reaction to the Entresto. She states that she went to a doctor who stopped it. She states her "blood count" went to 40. It is somewhat unclear if this is a blood pressure or blood sugar, but she states she was told to stop Entresto. She states that she has been sick anyway. She has stayed dizzy.   She states that her pressure did come back up after stopping Entresto 118/68, 106/60. She is wondering if she should stop the succinate and go back to the tartrate. I advised she continue the succinate for now and call if pressures remain low today and tomorrow. If pressures remain low she will switch back to tartrate. She will remain off Entresto for now. Advised she stay well hydrated in addition.   She will have the office that she saw send over the paperwork from her visit.

## 2018-03-13 IMAGING — MR MR HEAD W/O CM
9 of 10 series · 35 of 48 positions shown · non-contrast
Comparison: MRI 05/17/2006

CLINICAL DATA: Bilateral arm numbness, weakness.  Vision change

EXAM:
MRI HEAD WITHOUT CONTRAST
TECHNIQUE: Multiplanar, multiecho pulse sequences of the brain and surrounding
structures were obtained without intravenous contrast.

[Series 3: DWI · axial · 3.0mm · 1.09mm/px · z∈[-67,+76]mm · 8 of 98 slices shown (1 of 4)]
[im 1/98]
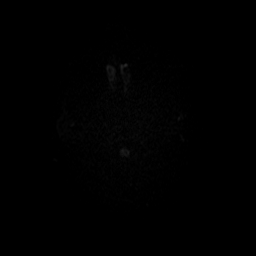
[im 11/98]
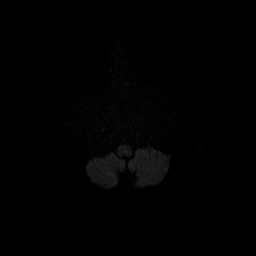
[im 33/98]
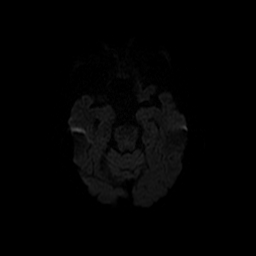
[im 44/98]
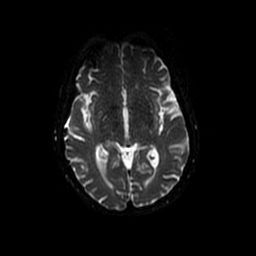
[im 54/98]
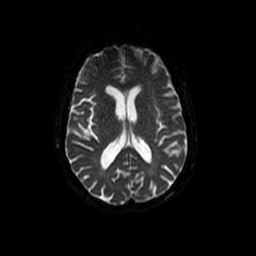
[im 65/98]
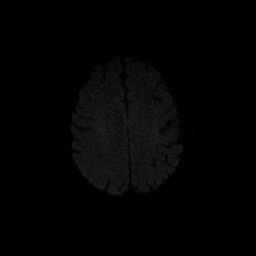
[im 87/98]
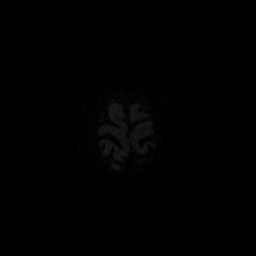
[im 98/98]
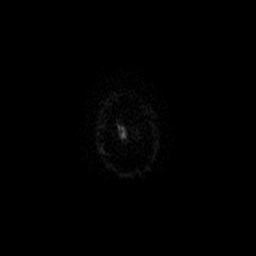

[Series 4: T2 · axial · 5.0mm · 0.47mm/px · z∈[-75,+80]mm · 3 of 27 slices shown (1 of 2)]
[im 1/27]
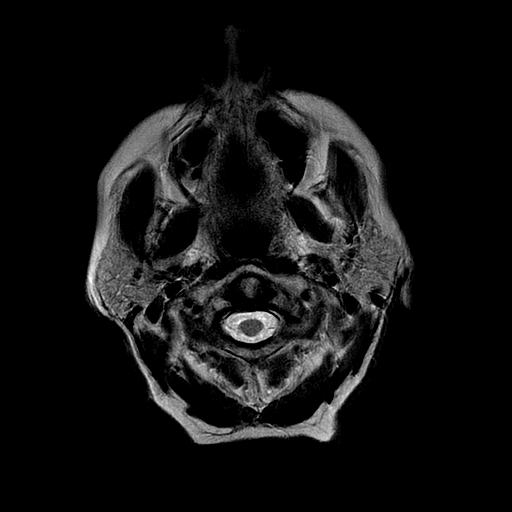
[im 14/27]
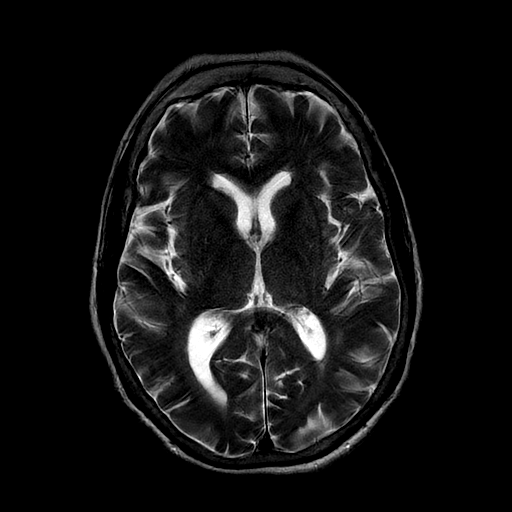
[im 27/27]
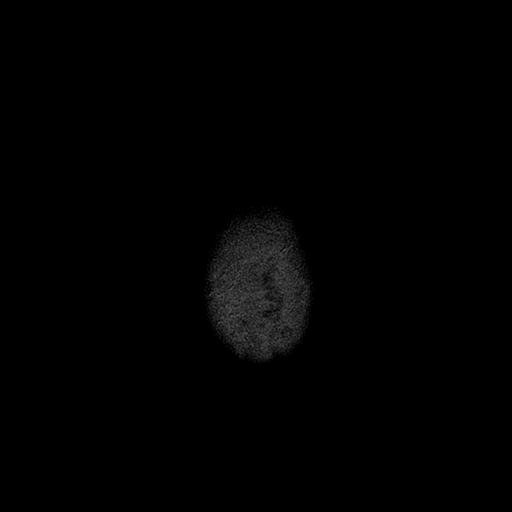

[Series 5: DWI · coronal · 5.0mm · 1.09mm/px · 7 of 68 slices shown (2 of 4)]
[im 1/68]
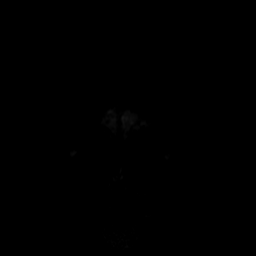
[im 12/68]
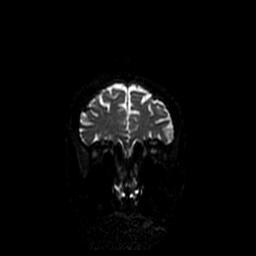
[im 23/68]
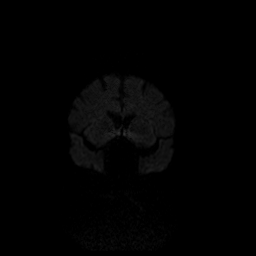
[im 34/68]
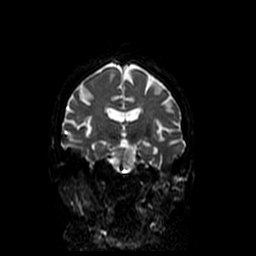
[im 45/68]
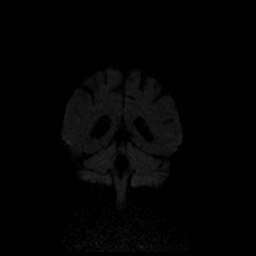
[im 56/68]
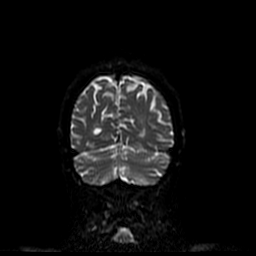
[im 68/68]
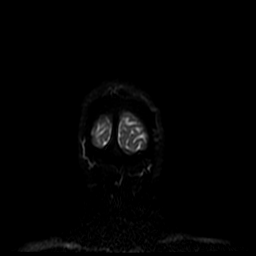

[Series 7: FLAIR · axial · 5.0mm · 0.47mm/px · z∈[-75,+80]mm · 3 of 27 slices shown]
[im 1/27]
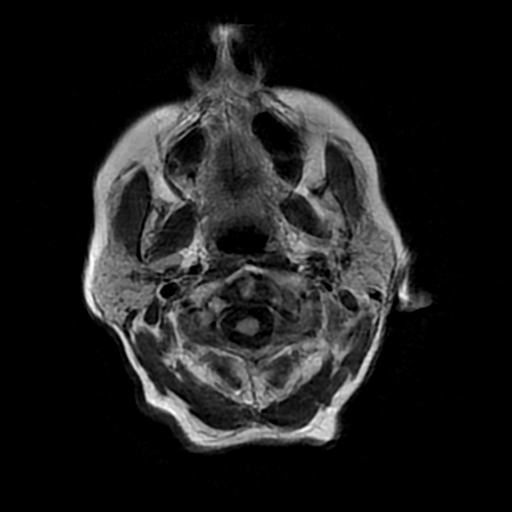
[im 14/27]
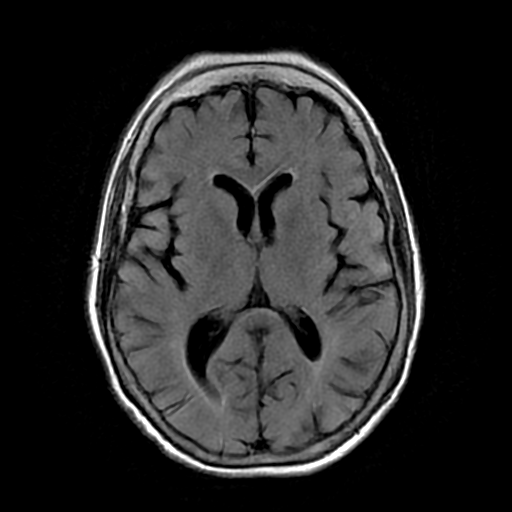
[im 27/27]
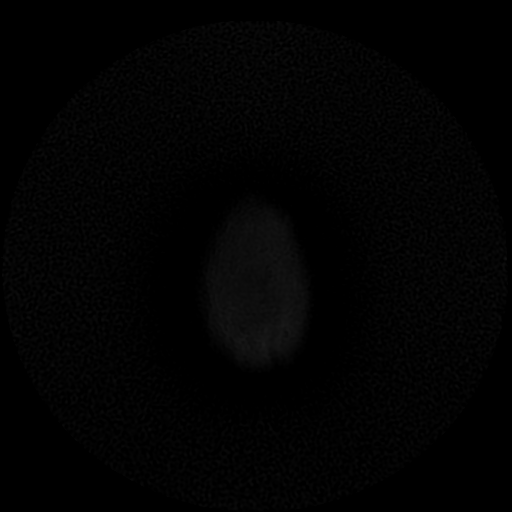

[Series 8: T1 · sagittal · 5.0mm · 0.47mm/px · 2 of 23 slices shown]
[im 1/23]
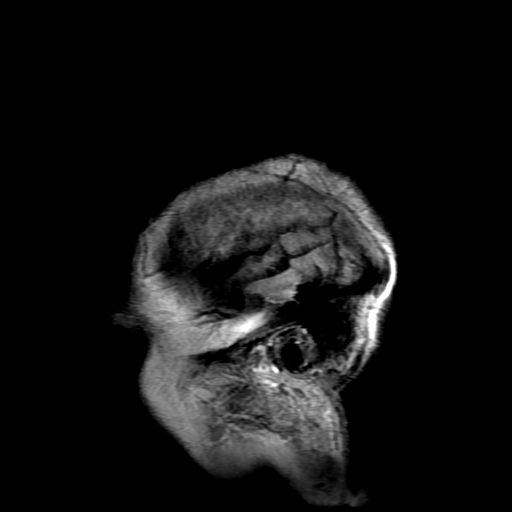
[im 23/23]
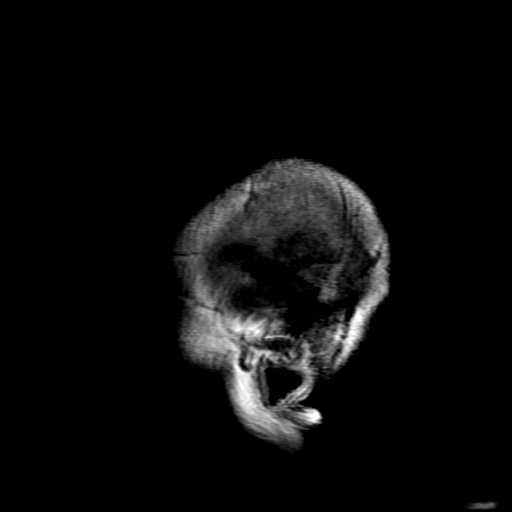

[Series 9: ax mpgr · axial · 5.0mm · 0.45mm/px · 1 of 23 slices shown]
[im 1/23]
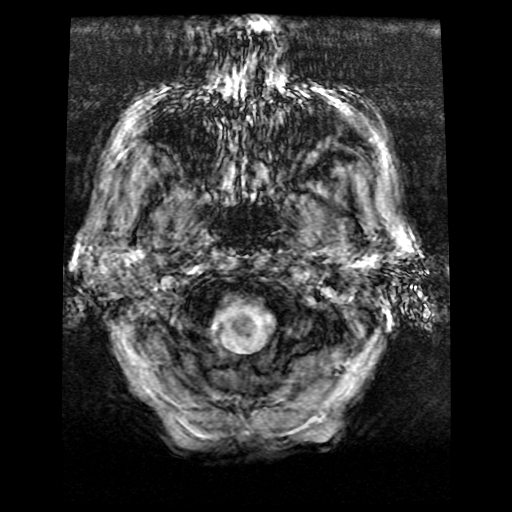

[Series 12: T2 · coronal · 5.0mm · 0.43mm/px · 3 of 31 slices shown (2 of 2)]
[im 1/31]
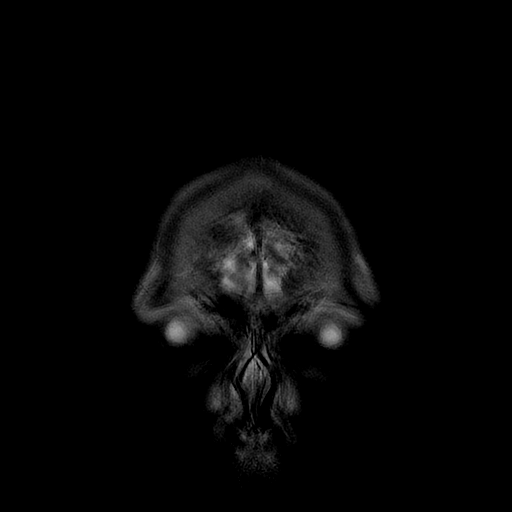
[im 16/31]
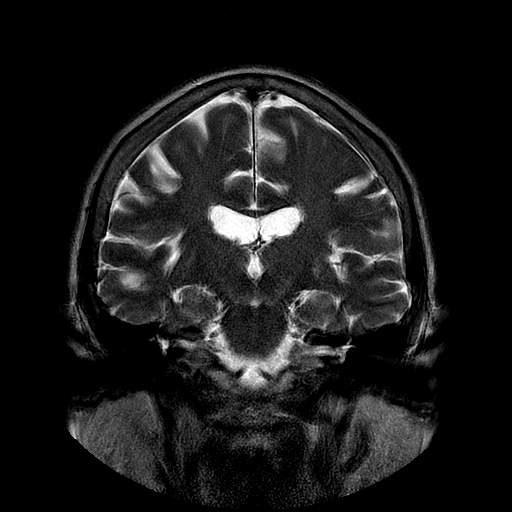
[im 31/31]
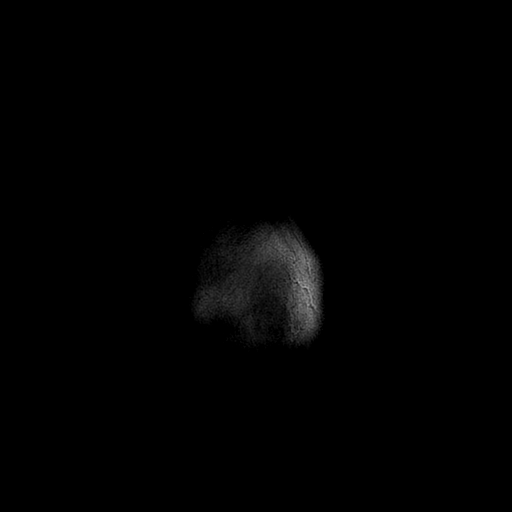

[Series 300: DWI · axial · 3.0mm · 1.09mm/px · z∈[-67,+76]mm · 5 of 49 slices shown (3 of 4)]
[im 1/49]
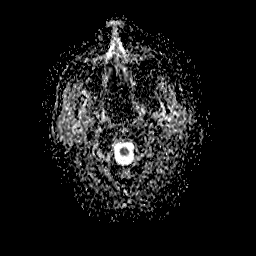
[im 13/49]
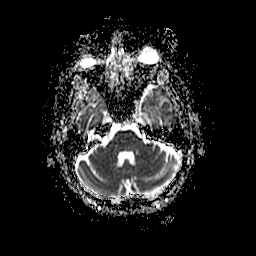
[im 25/49]
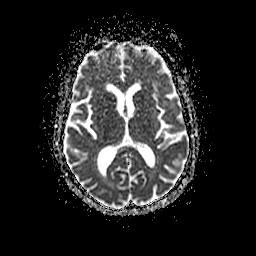
[im 37/49]
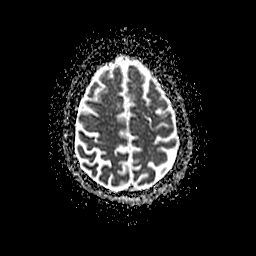
[im 49/49]
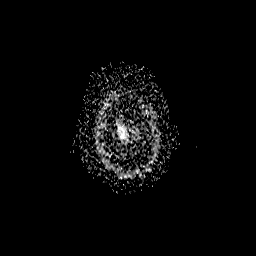

[Series 500: DWI · coronal · 5.0mm · 1.09mm/px · 3 of 34 slices shown (4 of 4)]
[im 1/34]
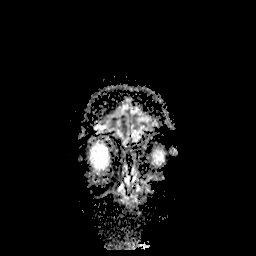
[im 17/34]
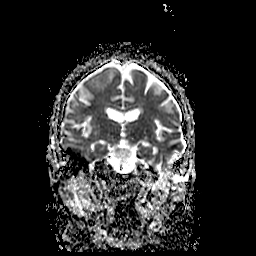
[im 34/34]
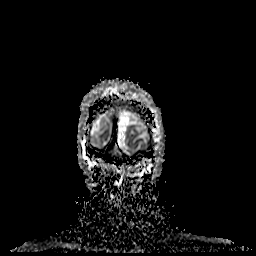

[35 of 48 positions shown; findings below may reference images not displayed]

FINDINGS: Brain: Image quality degraded by mild motion.

Ventricle size and cerebral volume normal for age. Negative for
acute infarct. No significant chronic ischemia. Negative for
hemorrhage or mass. No shift of the midline structures.

Vascular: Normal arterial flow voids.

Skull and upper cervical spine: Negative

Sinuses/Orbits: Negative

Other: None
IMPRESSION: Normal for age MRI of the brain without contrast. Image quality
degraded by motion.

## 2018-03-22 ENCOUNTER — Ambulatory Visit (INDEPENDENT_AMBULATORY_CARE_PROVIDER_SITE_OTHER): Payer: Medicare Other | Admitting: Pharmacist

## 2018-03-22 VITALS — BP 118/68 | HR 71

## 2018-03-22 DIAGNOSIS — I10 Essential (primary) hypertension: Secondary | ICD-10-CM

## 2018-03-22 DIAGNOSIS — I2510 Atherosclerotic heart disease of native coronary artery without angina pectoris: Secondary | ICD-10-CM

## 2018-03-22 MED ORDER — LISINOPRIL 2.5 MG PO TABS: 2.5000 mg | ORAL_TABLET | Freq: Every day | ORAL | 3 refills | Status: DC

## 2018-03-22 NOTE — Patient Instructions (Addendum)
Thanks for coming to see [name] today!  START lisinopril 2.5 mg by mouth every day in the morning. Take metoprolol succinate in the evenings.   STOP taking Entresto.   Keep track of your blood pressure at home and bring a log when you come back.   Follow up with [name] on April 18, 2018 at 9:30 AM.
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
## 2018-03-30 DIAGNOSIS — E113311 Type 2 diabetes mellitus with moderate nonproliferative diabetic retinopathy with macular edema, right eye: Secondary | ICD-10-CM | POA: Diagnosis not present

## 2018-03-30 DIAGNOSIS — H52203 Unspecified astigmatism, bilateral: Secondary | ICD-10-CM | POA: Diagnosis not present

## 2018-03-30 DIAGNOSIS — E113292 Type 2 diabetes mellitus with mild nonproliferative diabetic retinopathy without macular edema, left eye: Secondary | ICD-10-CM | POA: Diagnosis not present

## 2018-03-30 DIAGNOSIS — H353131 Nonexudative age-related macular degeneration, bilateral, early dry stage: Secondary | ICD-10-CM | POA: Diagnosis not present

## 2018-04-18 ENCOUNTER — Ambulatory Visit (INDEPENDENT_AMBULATORY_CARE_PROVIDER_SITE_OTHER): Payer: Medicare Other | Admitting: Pharmacist

## 2018-04-18 VITALS — BP 122/70 | HR 70

## 2018-04-18 DIAGNOSIS — I2510 Atherosclerotic heart disease of native coronary artery without angina pectoris: Secondary | ICD-10-CM

## 2018-04-18 DIAGNOSIS — I10 Essential (primary) hypertension: Secondary | ICD-10-CM | POA: Diagnosis not present

## 2018-04-18 LAB — BASIC METABOLIC PANEL
BUN/Creatinine Ratio: 19 (ref 12–28)
BUN: 18 mg/dL (ref 8–27)
CALCIUM: 9.6 mg/dL (ref 8.7–10.3)
CHLORIDE: 101 mmol/L (ref 96–106)
CO2: 23 mmol/L (ref 20–29)
Creatinine, Ser: 0.93 mg/dL (ref 0.57–1.00)
GFR calc non Af Amer: 58 mL/min/{1.73_m2} — ABNORMAL LOW (ref >59)
GFR, EST AFRICAN AMERICAN: 67 mL/min/{1.73_m2} (ref >59)
GLUCOSE: 178 mg/dL — AB (ref 65–99)
POTASSIUM: 4.8 mmol/L (ref 3.5–5.2)
Sodium: 138 mmol/L (ref 134–144)

## 2018-04-18 MED ORDER — CLOPIDOGREL BISULFATE 75 MG PO TABS: 75.0000 mg | ORAL_TABLET | Freq: Every day | ORAL | 11 refills | Status: DC
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
## 2018-04-18 NOTE — Patient Instructions (Signed)
Return for a follow up appointment as scheduled with Dr. Johnsie Cancel  Go to the lab today  Check your blood pressure at home daily (if able) and keep record of the readings.  Take your BP meds as follows: CONTINUE all as prescribed  RESTART clopidogrel 4m once daily   Bring all of your meds, your BP cuff and your record of home blood pressures to your next appointment.  Exercise as you're able, try to walk approximately 30 minutes per day.  Keep salt intake to a minimum, especially watch canned and prepared boxed foods.  Eat more fresh fruits and vegetables and fewer canned items.  Avoid eating in fast food restaurants.    HOW TO TAKE YOUR BLOOD PRESSURE: . Rest 5 minutes before taking your blood pressure. .  Don't smoke or drink caffeinated beverages for at least 30 minutes before. . Take your blood pressure before (not after) you eat. . Sit comfortably with your back supported and both feet on the floor (don't cross your legs). . Elevate your arm to heart level on a table or a desk. . Use the proper sized cuff. It should fit smoothly and snugly around your bare upper arm. There should be enough room to slip a fingertip under the cuff. The bottom edge of the cuff should be 1 inch above the crease of the elbow. . Ideally, take 3 measurements at one sitting and record the average.

## 2018-04-20 ENCOUNTER — Ambulatory Visit: Payer: Self-pay | Admitting: Cardiovascular Disease

## 2018-05-07 ENCOUNTER — Telehealth: Payer: Self-pay | Admitting: Pharmacist

## 2018-05-07 NOTE — Telephone Encounter (Signed)
Pt called and LMOM to find out if medication is working for her based on lab work.   Discussed that lab only tells if electrolytes are out of balance. That will need to repeat ECHO to evaluate pumping function of heart. She does have an appt with Dr. Johnsie Cancel on 05/21/18. Advised that she discuss with him at this visit if need to reevaluate with ECHO. She also complains of still feeling poorly on lisinopril 2.35m once daily. She is willing to continue for now.
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
## 2018-05-16 HISTORY — PX: PERONEAL NERVE DECOMPRESSION: SHX2226

## 2018-05-17 DIAGNOSIS — E1159 Type 2 diabetes mellitus with other circulatory complications: Secondary | ICD-10-CM | POA: Diagnosis not present

## 2018-05-17 DIAGNOSIS — R82998 Other abnormal findings in urine: Secondary | ICD-10-CM | POA: Diagnosis not present

## 2018-05-17 DIAGNOSIS — E039 Hypothyroidism, unspecified: Secondary | ICD-10-CM | POA: Diagnosis not present

## 2018-05-17 DIAGNOSIS — E538 Deficiency of other specified B group vitamins: Secondary | ICD-10-CM | POA: Diagnosis not present

## 2018-05-17 DIAGNOSIS — E559 Vitamin D deficiency, unspecified: Secondary | ICD-10-CM | POA: Diagnosis not present

## 2018-05-18 DIAGNOSIS — S52532D Colles' fracture of left radius, subsequent encounter for closed fracture with routine healing: Secondary | ICD-10-CM | POA: Diagnosis not present

## 2018-05-21 ENCOUNTER — Encounter: Payer: Self-pay | Admitting: Cardiovascular Disease

## 2018-05-21 ENCOUNTER — Ambulatory Visit (INDEPENDENT_AMBULATORY_CARE_PROVIDER_SITE_OTHER): Payer: Medicare Other | Admitting: Cardiovascular Disease

## 2018-05-21 ENCOUNTER — Encounter (INDEPENDENT_AMBULATORY_CARE_PROVIDER_SITE_OTHER): Payer: Self-pay

## 2018-05-21 VITALS — BP 118/62 | HR 79 | Ht 66.0 in | Wt 204.2 lb

## 2018-05-21 DIAGNOSIS — I350 Nonrheumatic aortic (valve) stenosis: Secondary | ICD-10-CM | POA: Diagnosis not present

## 2018-05-21 DIAGNOSIS — I2510 Atherosclerotic heart disease of native coronary artery without angina pectoris: Secondary | ICD-10-CM

## 2018-05-21 DIAGNOSIS — R002 Palpitations: Secondary | ICD-10-CM

## 2018-05-21 NOTE — Patient Instructions (Addendum)
Medication Instructions:   If you need a refill on your cardiac medications before your next appointment, please call your pharmacy.   Lab work:  If you have labs (blood work) drawn today and your tests are completely normal, you will receive your results only by: Marland Kitchen MyChart Message (if you have MyChart) OR . A paper copy in the mail If you have any lab test that is abnormal or we need to change your treatment, we will call you to review the results.  Testing/Procedures: None ordered today.  Follow-Up: At [facility], you and your health needs are our priority.  As part of our continuing mission to provide you with exceptional heart care, we have created designated Provider Care Teams.  These Care Teams include your primary Cardiologist (physician) and Advanced Practice Providers (APPs -  Physician Assistants and Nurse Practitioners) who all work together to provide you with the care you need, when you need it. You will need a follow up appointment in 12 months.  Please call our office 2 months in advance to schedule this appointment.  You may see [name], MD or one of the following Advanced Practice Providers on your designated Care Team:   Truitt Merle, NP Cecilie Kicks, NP . Kathyrn Drown, NP

## 2018-05-24 DIAGNOSIS — Z0000 Encounter for general adult medical examination without abnormal findings: Secondary | ICD-10-CM | POA: Diagnosis not present

## 2018-05-24 DIAGNOSIS — I10 Essential (primary) hypertension: Secondary | ICD-10-CM | POA: Diagnosis not present

## 2018-05-24 DIAGNOSIS — Z1339 Encounter for screening examination for other mental health and behavioral disorders: Secondary | ICD-10-CM | POA: Diagnosis not present

## 2018-05-24 DIAGNOSIS — E782 Mixed hyperlipidemia: Secondary | ICD-10-CM | POA: Diagnosis not present

## 2018-05-24 DIAGNOSIS — E1159 Type 2 diabetes mellitus with other circulatory complications: Secondary | ICD-10-CM | POA: Diagnosis not present

## 2018-05-24 DIAGNOSIS — Z1389 Encounter for screening for other disorder: Secondary | ICD-10-CM | POA: Diagnosis not present

## 2018-05-24 DIAGNOSIS — I8501 Esophageal varices with bleeding: Secondary | ICD-10-CM | POA: Diagnosis not present

## 2018-05-24 DIAGNOSIS — Z9861 Coronary angioplasty status: Secondary | ICD-10-CM | POA: Diagnosis not present

## 2018-05-24 DIAGNOSIS — Z6833 Body mass index (BMI) 33.0-33.9, adult: Secondary | ICD-10-CM | POA: Diagnosis not present

## 2018-05-24 DIAGNOSIS — E039 Hypothyroidism, unspecified: Secondary | ICD-10-CM | POA: Diagnosis not present

## 2018-05-24 DIAGNOSIS — Z1331 Encounter for screening for depression: Secondary | ICD-10-CM | POA: Diagnosis not present

## 2018-05-24 DIAGNOSIS — E559 Vitamin D deficiency, unspecified: Secondary | ICD-10-CM | POA: Diagnosis not present

## 2018-05-24 DIAGNOSIS — G459 Transient cerebral ischemic attack, unspecified: Secondary | ICD-10-CM | POA: Diagnosis not present

## 2018-05-24 DIAGNOSIS — E538 Deficiency of other specified B group vitamins: Secondary | ICD-10-CM | POA: Diagnosis not present

## 2018-06-27 ENCOUNTER — Other Ambulatory Visit: Payer: Self-pay | Admitting: Gastroenterology

## 2018-06-27 DIAGNOSIS — K7469 Other cirrhosis of liver: Secondary | ICD-10-CM

## 2018-06-27 DIAGNOSIS — R1310 Dysphagia, unspecified: Secondary | ICD-10-CM | POA: Diagnosis not present

## 2018-06-27 DIAGNOSIS — R110 Nausea: Secondary | ICD-10-CM | POA: Diagnosis not present

## 2018-06-28 DIAGNOSIS — M25532 Pain in left wrist: Secondary | ICD-10-CM | POA: Diagnosis not present

## 2018-06-28 DIAGNOSIS — S52532D Colles' fracture of left radius, subsequent encounter for closed fracture with routine healing: Secondary | ICD-10-CM | POA: Diagnosis not present

## 2018-07-03 DIAGNOSIS — H43812 Vitreous degeneration, left eye: Secondary | ICD-10-CM | POA: Diagnosis not present

## 2018-07-03 DIAGNOSIS — E113411 Type 2 diabetes mellitus with severe nonproliferative diabetic retinopathy with macular edema, right eye: Secondary | ICD-10-CM | POA: Diagnosis not present

## 2018-07-03 DIAGNOSIS — H5371 Glare sensitivity: Secondary | ICD-10-CM | POA: Diagnosis not present

## 2018-07-03 DIAGNOSIS — H353131 Nonexudative age-related macular degeneration, bilateral, early dry stage: Secondary | ICD-10-CM | POA: Diagnosis not present

## 2018-07-03 DIAGNOSIS — E113412 Type 2 diabetes mellitus with severe nonproliferative diabetic retinopathy with macular edema, left eye: Secondary | ICD-10-CM | POA: Diagnosis not present

## 2018-07-04 DIAGNOSIS — E113411 Type 2 diabetes mellitus with severe nonproliferative diabetic retinopathy with macular edema, right eye: Secondary | ICD-10-CM | POA: Diagnosis not present

## 2018-07-05 DIAGNOSIS — E113412 Type 2 diabetes mellitus with severe nonproliferative diabetic retinopathy with macular edema, left eye: Secondary | ICD-10-CM | POA: Diagnosis not present

## 2018-07-09 ENCOUNTER — Ambulatory Visit
Admission: RE | Admit: 2018-07-09 | Discharge: 2018-07-09 | Disposition: A | Discharge status: Home / Self Care | Payer: Medicare Other | Source: Ambulatory Visit | Attending: Gastroenterology | Admitting: Gastroenterology

## 2018-07-09 DIAGNOSIS — N281 Cyst of kidney, acquired: Secondary | ICD-10-CM | POA: Diagnosis not present

## 2018-07-09 DIAGNOSIS — H1131 Conjunctival hemorrhage, right eye: Secondary | ICD-10-CM | POA: Diagnosis not present

## 2018-07-09 DIAGNOSIS — K7469 Other cirrhosis of liver: Secondary | ICD-10-CM

## 2018-07-09 DIAGNOSIS — E113412 Type 2 diabetes mellitus with severe nonproliferative diabetic retinopathy with macular edema, left eye: Secondary | ICD-10-CM | POA: Diagnosis not present

## 2018-07-09 DIAGNOSIS — E113411 Type 2 diabetes mellitus with severe nonproliferative diabetic retinopathy with macular edema, right eye: Secondary | ICD-10-CM | POA: Diagnosis not present

## 2018-08-06 DIAGNOSIS — H353131 Nonexudative age-related macular degeneration, bilateral, early dry stage: Secondary | ICD-10-CM | POA: Diagnosis not present

## 2018-08-06 DIAGNOSIS — E113411 Type 2 diabetes mellitus with severe nonproliferative diabetic retinopathy with macular edema, right eye: Secondary | ICD-10-CM | POA: Diagnosis not present

## 2018-08-06 DIAGNOSIS — E113412 Type 2 diabetes mellitus with severe nonproliferative diabetic retinopathy with macular edema, left eye: Secondary | ICD-10-CM | POA: Diagnosis not present

## 2018-08-07 DIAGNOSIS — H43812 Vitreous degeneration, left eye: Secondary | ICD-10-CM | POA: Diagnosis not present

## 2018-08-07 DIAGNOSIS — E113412 Type 2 diabetes mellitus with severe nonproliferative diabetic retinopathy with macular edema, left eye: Secondary | ICD-10-CM | POA: Diagnosis not present

## 2018-08-09 ENCOUNTER — Telehealth: Payer: Self-pay

## 2018-08-09 MED ORDER — LISINOPRIL 2.5 MG PO TABS: 2.5000 mg | ORAL_TABLET | Freq: Every day | ORAL | 11 refills | Status: DC

## 2018-08-09 NOTE — Telephone Encounter (Signed)
Spoke with patient who states that she wanted to get a refill of her lisinopril. She is concerned about getting a follow up ECHO to make sure that her blood is flowing. Advised that Dr. Johnsie Cancel would like to get ECHO in Oct per note and she is very concerned about this. Advise that given current COVID situation, we would want to defer any unnecessary testing due to potential for exposure. We are trying to reduce risk as much as possible. She states she is doing well over all, but would like to get off some of the medication if better. Advised that she would need to stay on current heart medications to keep her heart doing well. She states understanding and will call in May to set up follow up appt if warranted.

## 2018-08-09 NOTE — Telephone Encounter (Signed)
Pt is requesting a refill of lisinopril doesn't know if you wanted to increase it or not.can you please call the pt and send in a refill but call the pt first to let them know if you want to increase or decrease? Route to pharmd [employer]

## 2018-08-30 DIAGNOSIS — M25532 Pain in left wrist: Secondary | ICD-10-CM | POA: Diagnosis not present

## 2018-09-03 ENCOUNTER — Emergency Department (HOSPITAL_COMMUNITY): Payer: Medicare Other

## 2018-09-03 ENCOUNTER — Other Ambulatory Visit: Payer: Self-pay

## 2018-09-03 ENCOUNTER — Emergency Department (HOSPITAL_COMMUNITY)
Admission: EM | Admit: 2018-09-03 | Discharge: 2018-09-03 | Disposition: A | Discharge status: Home / Self Care | Payer: Medicare Other | Attending: Emergency Medicine | Admitting: Emergency Medicine

## 2018-09-03 ENCOUNTER — Encounter (HOSPITAL_COMMUNITY): Payer: Self-pay | Admitting: Emergency Medicine

## 2018-09-03 DIAGNOSIS — E039 Hypothyroidism, unspecified: Secondary | ICD-10-CM | POA: Diagnosis not present

## 2018-09-03 DIAGNOSIS — S2242XA Multiple fractures of ribs, left side, initial encounter for closed fracture: Secondary | ICD-10-CM | POA: Insufficient documentation

## 2018-09-03 DIAGNOSIS — Z7902 Long term (current) use of antithrombotics/antiplatelets: Secondary | ICD-10-CM | POA: Diagnosis not present

## 2018-09-03 DIAGNOSIS — Z79899 Other long term (current) drug therapy: Secondary | ICD-10-CM | POA: Insufficient documentation

## 2018-09-03 DIAGNOSIS — R51 Headache: Secondary | ICD-10-CM | POA: Diagnosis not present

## 2018-09-03 DIAGNOSIS — Y939 Activity, unspecified: Secondary | ICD-10-CM | POA: Diagnosis not present

## 2018-09-03 DIAGNOSIS — E119 Type 2 diabetes mellitus without complications: Secondary | ICD-10-CM | POA: Insufficient documentation

## 2018-09-03 DIAGNOSIS — Y999 Unspecified external cause status: Secondary | ICD-10-CM | POA: Insufficient documentation

## 2018-09-03 DIAGNOSIS — Y929 Unspecified place or not applicable: Secondary | ICD-10-CM | POA: Diagnosis not present

## 2018-09-03 DIAGNOSIS — F0781 Postconcussional syndrome: Secondary | ICD-10-CM

## 2018-09-03 DIAGNOSIS — Z794 Long term (current) use of insulin: Secondary | ICD-10-CM | POA: Insufficient documentation

## 2018-09-03 DIAGNOSIS — I10 Essential (primary) hypertension: Secondary | ICD-10-CM | POA: Insufficient documentation

## 2018-09-03 DIAGNOSIS — W19XXXA Unspecified fall, initial encounter: Secondary | ICD-10-CM | POA: Diagnosis not present

## 2018-09-03 DIAGNOSIS — R0789 Other chest pain: Secondary | ICD-10-CM | POA: Diagnosis not present

## 2018-09-03 DIAGNOSIS — S0990XA Unspecified injury of head, initial encounter: Secondary | ICD-10-CM | POA: Diagnosis not present

## 2018-09-03 DIAGNOSIS — Z7982 Long term (current) use of aspirin: Secondary | ICD-10-CM | POA: Diagnosis not present

## 2018-09-03 DIAGNOSIS — S299XXA Unspecified injury of thorax, initial encounter: Secondary | ICD-10-CM | POA: Diagnosis present

## 2018-09-03 DIAGNOSIS — R202 Paresthesia of skin: Secondary | ICD-10-CM | POA: Diagnosis not present
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
## 2018-09-03 NOTE — ED Triage Notes (Addendum)
Fell on Thursday hit her head no loc fell on her ribs hurts to take a deep breath , has pressure behind her noser is on plavix , has numbness across her head it got better and thn got worse

## 2018-09-03 NOTE — Discharge Instructions (Addendum)
We saw in the ER for your headache, numbness and left-sided chest pain. X-rays show that you have a fracture of your rib #5 and 6.  There could be a fracture of rib #4 and 7 is well.  Since your fall occurred 4 days ago and you are not showing any signs of complications from the fractures, it is safe to send you home.  Please take over-the-counter pain medications for pain relief.  Also called the surgery team to get a close follow-up.  Use the incentive spirometer every 30 minutes when awake to open up your lungs.  Doing so would prevent complications like pneumonia.

## 2018-09-10 DIAGNOSIS — I10 Essential (primary) hypertension: Secondary | ICD-10-CM | POA: Diagnosis not present

## 2018-09-10 DIAGNOSIS — S2242XA Multiple fractures of ribs, left side, initial encounter for closed fracture: Secondary | ICD-10-CM | POA: Diagnosis not present

## 2018-09-10 DIAGNOSIS — S060X0A Concussion without loss of consciousness, initial encounter: Secondary | ICD-10-CM | POA: Diagnosis not present

## 2018-09-10 DIAGNOSIS — R51 Headache: Secondary | ICD-10-CM | POA: Diagnosis not present

## 2018-09-10 DIAGNOSIS — R296 Repeated falls: Secondary | ICD-10-CM | POA: Diagnosis not present

## 2018-09-11 ENCOUNTER — Other Ambulatory Visit: Payer: Self-pay | Admitting: Cardiovascular Disease

## 2018-09-12 ENCOUNTER — Telehealth: Payer: Self-pay | Admitting: Cardiovascular Disease

## 2018-09-12 NOTE — Telephone Encounter (Incomplete)
New Message °

## 2018-09-13 ENCOUNTER — Telehealth: Payer: Self-pay | Admitting: Cardiovascular Disease

## 2018-09-13 DIAGNOSIS — R9430 Abnormal result of cardiovascular function study, unspecified: Secondary | ICD-10-CM

## 2018-09-13 DIAGNOSIS — I359 Nonrheumatic aortic valve disorder, unspecified: Secondary | ICD-10-CM

## 2018-09-13 MED ORDER — NITROGLYCERIN 0.4 MG SL SUBL: 0.4000 mg | SUBLINGUAL_TABLET | SUBLINGUAL | 5 refills | Status: AC | PRN

## 2018-09-13 NOTE — Telephone Encounter (Signed)
Patient called stating she is out of medication.

## 2018-09-13 NOTE — Telephone Encounter (Signed)
Called patient back about her message. Patient wants to know if she can go ahead and get an echo now. Patient has been seeing the pharm D about her medications and they were wanting to increase one of them, but patient wants to see what her echo shows now that she has been on medication for a while. Patient stated she does not want to increase her medications because of the why it makes her feel, but if her echo has not improved she will do what she has to. Will forward to Dr. Johnsie Cancel for advisement. See phone notes from Erhard D.

## 2018-09-13 NOTE — Telephone Encounter (Signed)
Ok to schedule echo ? Lab open May 11

## 2018-09-13 NOTE — Telephone Encounter (Addendum)
Patient would like nurse to call her.  She would like to come in to see Dr. Johnsie Cancel, she would also like to schedule an ECHO however there isn't an order for an ECHO in Epic.

## 2018-09-13 NOTE — Telephone Encounter (Signed)
Left message for patient to call back  

## 2018-09-14 DIAGNOSIS — I10 Essential (primary) hypertension: Secondary | ICD-10-CM | POA: Diagnosis not present

## 2018-09-14 DIAGNOSIS — R296 Repeated falls: Secondary | ICD-10-CM | POA: Diagnosis not present

## 2018-09-14 DIAGNOSIS — S060X0A Concussion without loss of consciousness, initial encounter: Secondary | ICD-10-CM | POA: Diagnosis not present

## 2018-09-14 DIAGNOSIS — R51 Headache: Secondary | ICD-10-CM | POA: Diagnosis not present

## 2018-09-14 NOTE — Telephone Encounter (Signed)
Called patient back with Dr. Kyla Balzarine recommendations. Patient verbalized understanding and will have scheduling call her and make an appt for an echo after May 11.

## 2018-09-14 NOTE — Telephone Encounter (Signed)
Follow up:    Patient returning call from yesterday. Please call patient back.

## 2018-09-17 DIAGNOSIS — R2689 Other abnormalities of gait and mobility: Secondary | ICD-10-CM | POA: Diagnosis not present

## 2018-09-18 ENCOUNTER — Telehealth: Payer: Self-pay | Admitting: Cardiovascular Disease

## 2018-09-18 NOTE — Telephone Encounter (Signed)
Pt inquiring about her echo being scheduled. States she has not gotten a cal about this. Made aware that I would forward this note to our echo scheduler, Dutch Quint, to arrange echo. Patient verbalized understanding.

## 2018-09-18 NOTE — Telephone Encounter (Signed)
Follow up:   Patient returning call back concering a EKG appt please call patient back concering the time.

## 2018-09-19 DIAGNOSIS — R42 Dizziness and giddiness: Secondary | ICD-10-CM | POA: Diagnosis not present

## 2018-09-19 DIAGNOSIS — N183 Chronic kidney disease, stage 3 (moderate): Secondary | ICD-10-CM | POA: Diagnosis not present

## 2018-09-19 DIAGNOSIS — R2689 Other abnormalities of gait and mobility: Secondary | ICD-10-CM | POA: Diagnosis not present

## 2018-09-19 DIAGNOSIS — N179 Acute kidney failure, unspecified: Secondary | ICD-10-CM | POA: Diagnosis not present

## 2018-09-19 DIAGNOSIS — I129 Hypertensive chronic kidney disease with stage 1 through stage 4 chronic kidney disease, or unspecified chronic kidney disease: Secondary | ICD-10-CM | POA: Diagnosis not present

## 2018-09-20 ENCOUNTER — Telehealth: Payer: Self-pay | Admitting: *Deleted

## 2018-09-20 NOTE — Telephone Encounter (Signed)
LVM advising Due to current COVID 19 pandemic, our office is severely reducing in person visits in order to minimize the risk to our patients and healthcare providers. We recommend to convert your appointment to a video visit and it can be moved sooner. Advised we are closed on Friday, requested call back to discuss.

## 2018-09-20 NOTE — Telephone Encounter (Signed)
Pt returned call and stated she was given her appt yesterday due to having a concussion, she states she doesn't have anything to do a video visit with, she states that's why she was given 10/09/2018.

## 2018-09-20 NOTE — Telephone Encounter (Signed)
Noted  

## 2018-09-21 ENCOUNTER — Ambulatory Visit (HOSPITAL_COMMUNITY): Payer: Medicare Other | Attending: Internal Medicine

## 2018-09-21 ENCOUNTER — Other Ambulatory Visit: Payer: Self-pay

## 2018-09-21 DIAGNOSIS — I359 Nonrheumatic aortic valve disorder, unspecified: Secondary | ICD-10-CM | POA: Diagnosis not present

## 2018-09-21 DIAGNOSIS — R9430 Abnormal result of cardiovascular function study, unspecified: Secondary | ICD-10-CM | POA: Diagnosis not present

## 2018-09-24 ENCOUNTER — Telehealth: Payer: Self-pay

## 2018-09-24 DIAGNOSIS — I129 Hypertensive chronic kidney disease with stage 1 through stage 4 chronic kidney disease, or unspecified chronic kidney disease: Secondary | ICD-10-CM | POA: Diagnosis not present

## 2018-09-24 DIAGNOSIS — N179 Acute kidney failure, unspecified: Secondary | ICD-10-CM | POA: Diagnosis not present

## 2018-09-24 DIAGNOSIS — I359 Nonrheumatic aortic valve disorder, unspecified: Secondary | ICD-10-CM

## 2018-09-24 DIAGNOSIS — N183 Chronic kidney disease, stage 3 (moderate): Secondary | ICD-10-CM | POA: Diagnosis not present

## 2018-09-24 NOTE — Telephone Encounter (Signed)
Called patient back. Patient aware of results.

## 2018-09-24 NOTE — Telephone Encounter (Signed)
-----   Message from Josue Hector, MD sent at 09/24/2018  9:35 AM EDT ----- EF and AS stasble mild to moderate AS f/u echo in a year

## 2018-09-24 NOTE — Telephone Encounter (Signed)
Follow Up:; ° ° °Returning your call. °

## 2018-09-24 NOTE — Telephone Encounter (Signed)
Left message for patient to call back  

## 2018-09-25 DIAGNOSIS — E113412 Type 2 diabetes mellitus with severe nonproliferative diabetic retinopathy with macular edema, left eye: Secondary | ICD-10-CM | POA: Diagnosis not present

## 2018-09-25 DIAGNOSIS — E113411 Type 2 diabetes mellitus with severe nonproliferative diabetic retinopathy with macular edema, right eye: Secondary | ICD-10-CM | POA: Diagnosis not present

## 2018-09-25 DIAGNOSIS — H353131 Nonexudative age-related macular degeneration, bilateral, early dry stage: Secondary | ICD-10-CM | POA: Diagnosis not present

## 2018-09-25 DIAGNOSIS — H43812 Vitreous degeneration, left eye: Secondary | ICD-10-CM | POA: Diagnosis not present

## 2018-09-25 NOTE — Telephone Encounter (Signed)
Noted. Pt previously unable to tolerate dose increase due to low blood pressure. Will continue current therapy as prescribed.

## 2018-09-25 NOTE — Telephone Encounter (Signed)
Left message with husband to call back. Just need to advise to continue current therapies at current dosage.

## 2018-09-26 NOTE — Telephone Encounter (Signed)
Patient returned phone call. Was advised to continue lisinopril and metoprolol at the same doses she is currently on.

## 2018-09-27 DIAGNOSIS — G43809 Other migraine, not intractable, without status migrainosus: Secondary | ICD-10-CM | POA: Diagnosis not present

## 2018-09-28 ENCOUNTER — Other Ambulatory Visit (HOSPITAL_COMMUNITY): Payer: Self-pay | Admitting: Internal Medicine

## 2018-09-28 ENCOUNTER — Ambulatory Visit (HOSPITAL_COMMUNITY)
Admission: RE | Admit: 2018-09-28 | Discharge: 2018-09-28 | Disposition: A | Discharge status: Home / Self Care | Payer: Medicare Other | Source: Ambulatory Visit | Attending: Internal Medicine | Admitting: Internal Medicine

## 2018-09-28 ENCOUNTER — Telehealth: Payer: Self-pay | Admitting: Diagnostic Neuroimaging

## 2018-09-28 ENCOUNTER — Other Ambulatory Visit: Payer: Self-pay | Admitting: Internal Medicine

## 2018-09-28 ENCOUNTER — Other Ambulatory Visit: Payer: Self-pay

## 2018-09-28 DIAGNOSIS — S060X0A Concussion without loss of consciousness, initial encounter: Secondary | ICD-10-CM | POA: Insufficient documentation

## 2018-09-28 DIAGNOSIS — R296 Repeated falls: Secondary | ICD-10-CM

## 2018-09-28 LAB — CREATININE, SERUM
Creatinine, Ser: 1.09 mg/dL — ABNORMAL HIGH (ref 0.44–1.00)
GFR calc Af Amer: 55 mL/min — ABNORMAL LOW (ref >60)
GFR calc non Af Amer: 48 mL/min — ABNORMAL LOW (ref >60)

## 2018-09-28 MED ORDER — GADOBUTROL 1 MMOL/ML IV SOLN
9.0000 mL | Freq: Once | INTRAVENOUS | Status: AC | PRN
  Administered 2018-09-28: 9 mL via INTRAVENOUS

## 2018-09-28 NOTE — Telephone Encounter (Signed)
[facility] office called stating pt called their office telling them that she called [facility] and has not heard back from anyone.  Brianna asked if pt could have her in office visit earlier than her initial appointment.  [place] Ar was told about Tues and Thurs are the days Dr Leta Baptist is doing his in office visits(afternoons) .  She asked pt be put in the 4pm slot for 05-21.  No call back requested.

## 2018-10-03 ENCOUNTER — Encounter: Payer: Self-pay | Admitting: *Deleted

## 2018-10-04 ENCOUNTER — Other Ambulatory Visit: Payer: Self-pay

## 2018-10-04 ENCOUNTER — Telehealth: Payer: Self-pay | Admitting: *Deleted

## 2018-10-04 ENCOUNTER — Ambulatory Visit (INDEPENDENT_AMBULATORY_CARE_PROVIDER_SITE_OTHER): Payer: Medicare Other | Admitting: Diagnostic Neuroimaging

## 2018-10-04 ENCOUNTER — Encounter: Payer: Self-pay | Admitting: Diagnostic Neuroimaging

## 2018-10-04 VITALS — BP 134/80 | HR 76 | Temp 97.7°F | Ht 66.0 in | Wt 203.0 lb

## 2018-10-04 DIAGNOSIS — F0781 Postconcussional syndrome: Secondary | ICD-10-CM | POA: Diagnosis not present

## 2018-10-04 DIAGNOSIS — I2510 Atherosclerotic heart disease of native coronary artery without angina pectoris: Secondary | ICD-10-CM | POA: Diagnosis not present

## 2018-10-04 NOTE — Telephone Encounter (Signed)
Patient stopped on her way out of office and requested Dr Leta Baptist be asked if she should expect the "red" she saw out of her eyes to return. She stated she mentioned it to him, but forgot to ask him if it could return. She stated it has gone away now. I advised will send him a note and call her next week with his reply. She  verbalized understanding, appreciation.
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
## 2018-10-09 ENCOUNTER — Ambulatory Visit: Payer: Medicare Other | Admitting: Diagnostic Neuroimaging

## 2018-10-10 DIAGNOSIS — E1159 Type 2 diabetes mellitus with other circulatory complications: Secondary | ICD-10-CM | POA: Diagnosis not present

## 2018-10-10 DIAGNOSIS — E039 Hypothyroidism, unspecified: Secondary | ICD-10-CM | POA: Diagnosis not present

## 2018-10-10 DIAGNOSIS — I129 Hypertensive chronic kidney disease with stage 1 through stage 4 chronic kidney disease, or unspecified chronic kidney disease: Secondary | ICD-10-CM | POA: Diagnosis not present

## 2018-10-10 DIAGNOSIS — R296 Repeated falls: Secondary | ICD-10-CM | POA: Diagnosis not present

## 2018-10-10 DIAGNOSIS — N179 Acute kidney failure, unspecified: Secondary | ICD-10-CM | POA: Diagnosis not present

## 2018-10-10 DIAGNOSIS — I10 Essential (primary) hypertension: Secondary | ICD-10-CM | POA: Diagnosis not present

## 2018-10-10 DIAGNOSIS — S060X0A Concussion without loss of consciousness, initial encounter: Secondary | ICD-10-CM | POA: Diagnosis not present

## 2018-10-10 DIAGNOSIS — E782 Mixed hyperlipidemia: Secondary | ICD-10-CM | POA: Diagnosis not present

## 2018-10-10 DIAGNOSIS — N183 Chronic kidney disease, stage 3 (moderate): Secondary | ICD-10-CM | POA: Diagnosis not present

## 2018-10-10 DIAGNOSIS — R42 Dizziness and giddiness: Secondary | ICD-10-CM | POA: Diagnosis not present

## 2018-10-10 DIAGNOSIS — Z9861 Coronary angioplasty status: Secondary | ICD-10-CM | POA: Diagnosis not present

## 2018-10-10 DIAGNOSIS — S2242XA Multiple fractures of ribs, left side, initial encounter for closed fracture: Secondary | ICD-10-CM | POA: Diagnosis not present

## 2018-10-11 DIAGNOSIS — R2689 Other abnormalities of gait and mobility: Secondary | ICD-10-CM | POA: Diagnosis not present

## 2018-10-15 DIAGNOSIS — I639 Cerebral infarction, unspecified: Secondary | ICD-10-CM

## 2018-10-15 DIAGNOSIS — R2689 Other abnormalities of gait and mobility: Secondary | ICD-10-CM | POA: Diagnosis not present

## 2018-10-15 HISTORY — DX: Cerebral infarction, unspecified: I63.9

## 2018-10-15 HISTORY — PX: LOOP RECORDER INSERTION: EP1214

## 2018-10-18 DIAGNOSIS — R2689 Other abnormalities of gait and mobility: Secondary | ICD-10-CM | POA: Diagnosis not present

## 2018-10-22 ENCOUNTER — Telehealth: Payer: Self-pay | Admitting: Diagnostic Neuroimaging

## 2018-10-22 DIAGNOSIS — E113412 Type 2 diabetes mellitus with severe nonproliferative diabetic retinopathy with macular edema, left eye: Secondary | ICD-10-CM | POA: Diagnosis not present

## 2018-10-22 DIAGNOSIS — R2689 Other abnormalities of gait and mobility: Secondary | ICD-10-CM | POA: Diagnosis not present

## 2018-10-22 DIAGNOSIS — G43809 Other migraine, not intractable, without status migrainosus: Secondary | ICD-10-CM | POA: Diagnosis not present

## 2018-10-22 DIAGNOSIS — H5371 Glare sensitivity: Secondary | ICD-10-CM | POA: Diagnosis not present

## 2018-10-22 DIAGNOSIS — F0781 Postconcussional syndrome: Secondary | ICD-10-CM | POA: Diagnosis not present

## 2018-10-22 DIAGNOSIS — E113411 Type 2 diabetes mellitus with severe nonproliferative diabetic retinopathy with macular edema, right eye: Secondary | ICD-10-CM | POA: Diagnosis not present

## 2018-10-22 NOTE — Telephone Encounter (Signed)
LVM apologizing to patient that I was unable to call back in time. She has no mobile # listed. I advised Dr Leta Baptist stated to hold off on PT and consider seeing opthalmology again. I left number for any questions.

## 2018-10-22 NOTE — Telephone Encounter (Signed)
Hold off on PT; consider follow up with ophthalmology. -VRP

## 2018-10-22 NOTE — Telephone Encounter (Signed)
Received call from patient stating she was seeing red at night, occurring at 8 pm or later for past 3 nights. She stated she has had this before, was expecting it to resolve by now. She denies any other symptoms except intermittent pressure behind her eyes which is not a new symptom.  She has PT at 2, wants to know if it is okay for her to go. I advised will send message to Dr Leta Baptist and call her back as soon as I can. She  verbalized understanding, appreciation.

## 2018-10-22 NOTE — Telephone Encounter (Signed)
Patient came into office this afternoon asking to speak with RN. She had been to opthalmologist. She chose to wait until I could speak with her. She gave notes from her visit today with Dr Zadie Rhine, [facility]; notes on [facility] desk for review. Had long discussion about her episodes of seeing red. I advised she monitor her symptoms, follow Dr Dahlia Bailiff advise re: controlling HTN, diabetes, lipids. I encouraged her that her post concussive symptoms will hopefully resolve over time. She asked about PT, and I advised if she feels well she should continue with PT. She verbalized understanding, appreciation.

## 2018-10-26 ENCOUNTER — Emergency Department (HOSPITAL_COMMUNITY): Payer: Medicare Other

## 2018-10-26 ENCOUNTER — Other Ambulatory Visit: Payer: Self-pay

## 2018-10-26 ENCOUNTER — Encounter (HOSPITAL_COMMUNITY): Payer: Self-pay | Admitting: Emergency Medicine

## 2018-10-26 ENCOUNTER — Inpatient Hospital Stay (HOSPITAL_COMMUNITY)
Admission: EM | Admit: 2018-10-26 | Discharge: 2018-10-30 | DRG: 041 | Disposition: A | Discharge status: Home Health | Payer: Medicare Other | Attending: Neurology | Admitting: Neurology

## 2018-10-26 DIAGNOSIS — W19XXXA Unspecified fall, initial encounter: Secondary | ICD-10-CM | POA: Diagnosis present

## 2018-10-26 DIAGNOSIS — F0781 Postconcussional syndrome: Secondary | ICD-10-CM | POA: Diagnosis present

## 2018-10-26 DIAGNOSIS — Z79890 Hormone replacement therapy (postmenopausal): Secondary | ICD-10-CM

## 2018-10-26 DIAGNOSIS — M1990 Unspecified osteoarthritis, unspecified site: Secondary | ICD-10-CM | POA: Diagnosis present

## 2018-10-26 DIAGNOSIS — E669 Obesity, unspecified: Secondary | ICD-10-CM | POA: Diagnosis present

## 2018-10-26 DIAGNOSIS — M50321 Other cervical disc degeneration at C4-C5 level: Secondary | ICD-10-CM | POA: Diagnosis not present

## 2018-10-26 DIAGNOSIS — Z79899 Other long term (current) drug therapy: Secondary | ICD-10-CM | POA: Diagnosis not present

## 2018-10-26 DIAGNOSIS — Z7902 Long term (current) use of antithrombotics/antiplatelets: Secondary | ICD-10-CM | POA: Diagnosis not present

## 2018-10-26 DIAGNOSIS — I6350 Cerebral infarction due to unspecified occlusion or stenosis of unspecified cerebral artery: Secondary | ICD-10-CM | POA: Diagnosis not present

## 2018-10-26 DIAGNOSIS — Z8673 Personal history of transient ischemic attack (TIA), and cerebral infarction without residual deficits: Secondary | ICD-10-CM | POA: Diagnosis not present

## 2018-10-26 DIAGNOSIS — E039 Hypothyroidism, unspecified: Secondary | ICD-10-CM | POA: Diagnosis present

## 2018-10-26 DIAGNOSIS — Z6835 Body mass index (BMI) 35.0-35.9, adult: Secondary | ICD-10-CM

## 2018-10-26 DIAGNOSIS — R002 Palpitations: Secondary | ICD-10-CM | POA: Diagnosis not present

## 2018-10-26 DIAGNOSIS — Z7982 Long term (current) use of aspirin: Secondary | ICD-10-CM | POA: Diagnosis not present

## 2018-10-26 DIAGNOSIS — Z8249 Family history of ischemic heart disease and other diseases of the circulatory system: Secondary | ICD-10-CM

## 2018-10-26 DIAGNOSIS — I25119 Atherosclerotic heart disease of native coronary artery with unspecified angina pectoris: Secondary | ICD-10-CM | POA: Diagnosis not present

## 2018-10-26 DIAGNOSIS — I252 Old myocardial infarction: Secondary | ICD-10-CM

## 2018-10-26 DIAGNOSIS — E785 Hyperlipidemia, unspecified: Secondary | ICD-10-CM

## 2018-10-26 DIAGNOSIS — E119 Type 2 diabetes mellitus without complications: Secondary | ICD-10-CM | POA: Diagnosis not present

## 2018-10-26 DIAGNOSIS — I4510 Unspecified right bundle-branch block: Secondary | ICD-10-CM | POA: Diagnosis present

## 2018-10-26 DIAGNOSIS — E1165 Type 2 diabetes mellitus with hyperglycemia: Secondary | ICD-10-CM | POA: Diagnosis present

## 2018-10-26 DIAGNOSIS — Z794 Long term (current) use of insulin: Secondary | ICD-10-CM | POA: Diagnosis not present

## 2018-10-26 DIAGNOSIS — I63412 Cerebral infarction due to embolism of left middle cerebral artery: Secondary | ICD-10-CM | POA: Diagnosis not present

## 2018-10-26 DIAGNOSIS — I639 Cerebral infarction, unspecified: Secondary | ICD-10-CM | POA: Diagnosis not present

## 2018-10-26 DIAGNOSIS — I255 Ischemic cardiomyopathy: Secondary | ICD-10-CM | POA: Diagnosis present

## 2018-10-26 DIAGNOSIS — Z1159 Encounter for screening for other viral diseases: Secondary | ICD-10-CM | POA: Diagnosis not present

## 2018-10-26 DIAGNOSIS — R29810 Facial weakness: Secondary | ICD-10-CM | POA: Diagnosis present

## 2018-10-26 DIAGNOSIS — I2510 Atherosclerotic heart disease of native coronary artery without angina pectoris: Secondary | ICD-10-CM | POA: Diagnosis not present

## 2018-10-26 DIAGNOSIS — I10 Essential (primary) hypertension: Secondary | ICD-10-CM | POA: Diagnosis not present

## 2018-10-26 DIAGNOSIS — R404 Transient alteration of awareness: Secondary | ICD-10-CM | POA: Diagnosis not present

## 2018-10-26 DIAGNOSIS — R2990 Unspecified symptoms and signs involving the nervous system: Secondary | ICD-10-CM | POA: Diagnosis present

## 2018-10-26 DIAGNOSIS — I6389 Other cerebral infarction: Secondary | ICD-10-CM | POA: Diagnosis not present

## 2018-10-26 DIAGNOSIS — R4701 Aphasia: Secondary | ICD-10-CM | POA: Diagnosis present

## 2018-10-26 DIAGNOSIS — I350 Nonrheumatic aortic (valve) stenosis: Secondary | ICD-10-CM | POA: Diagnosis present

## 2018-10-26 DIAGNOSIS — G8191 Hemiplegia, unspecified affecting right dominant side: Secondary | ICD-10-CM | POA: Diagnosis not present

## 2018-10-26 DIAGNOSIS — G459 Transient cerebral ischemic attack, unspecified: Secondary | ICD-10-CM | POA: Diagnosis not present

## 2018-10-26 DIAGNOSIS — Z96653 Presence of artificial knee joint, bilateral: Secondary | ICD-10-CM | POA: Diagnosis present

## 2018-10-26 DIAGNOSIS — E1159 Type 2 diabetes mellitus with other circulatory complications: Secondary | ICD-10-CM | POA: Diagnosis not present

## 2018-10-26 DIAGNOSIS — Z880 Allergy status to penicillin: Secondary | ICD-10-CM

## 2018-10-26 DIAGNOSIS — I8500 Esophageal varices without bleeding: Secondary | ICD-10-CM | POA: Diagnosis present

## 2018-10-26 DIAGNOSIS — K922 Gastrointestinal hemorrhage, unspecified: Secondary | ICD-10-CM | POA: Diagnosis present

## 2018-10-26 DIAGNOSIS — R531 Weakness: Secondary | ICD-10-CM | POA: Diagnosis not present

## 2018-10-26 DIAGNOSIS — Z85828 Personal history of other malignant neoplasm of skin: Secondary | ICD-10-CM

## 2018-10-26 DIAGNOSIS — R4781 Slurred speech: Secondary | ICD-10-CM | POA: Diagnosis not present

## 2018-10-26 DIAGNOSIS — Z885 Allergy status to narcotic agent status: Secondary | ICD-10-CM

## 2018-10-26 DIAGNOSIS — R410 Disorientation, unspecified: Secondary | ICD-10-CM | POA: Diagnosis not present

## 2018-10-26 DIAGNOSIS — Z955 Presence of coronary angioplasty implant and graft: Secondary | ICD-10-CM

## 2018-10-26 DIAGNOSIS — R29715 NIHSS score 15: Secondary | ICD-10-CM | POA: Diagnosis present

## 2018-10-26 DIAGNOSIS — Z20828 Contact with and (suspected) exposure to other viral communicable diseases: Secondary | ICD-10-CM | POA: Diagnosis not present

## 2018-10-26 LAB — CBC
HCT: 37.9 % (ref 36.0–46.0)
Hemoglobin: 12.4 g/dL (ref 12.0–15.0)
MCH: 30 pg (ref 26.0–34.0)
MCHC: 32.7 g/dL (ref 30.0–36.0)
MCV: 91.8 fL (ref 80.0–100.0)
Platelets: 159 10*3/uL (ref 150–400)
RBC: 4.13 MIL/uL (ref 3.87–5.11)
RDW: 13.8 % (ref 11.5–15.5)
WBC: 5.6 10*3/uL (ref 4.0–10.5)
nRBC: 0 % (ref 0.0–0.2)

## 2018-10-26 LAB — I-STAT CHEM 8, ED
BUN: 19 mg/dL (ref 8–23)
Calcium, Ion: 1.05 mmol/L — ABNORMAL LOW (ref 1.15–1.40)
Chloride: 104 mmol/L (ref 98–111)
Creatinine, Ser: 1 mg/dL (ref 0.44–1.00)
Glucose, Bld: 177 mg/dL — ABNORMAL HIGH (ref 70–99)
HCT: 39 % (ref 36.0–46.0)
Hemoglobin: 13.3 g/dL (ref 12.0–15.0)
Potassium: 4.2 mmol/L (ref 3.5–5.1)
Sodium: 136 mmol/L (ref 135–145)
TCO2: 20 mmol/L — ABNORMAL LOW (ref 22–32)

## 2018-10-26 LAB — PROTIME-INR
INR: 1.1 (ref 0.8–1.2)
Prothrombin Time: 13.8 seconds (ref 11.4–15.2)

## 2018-10-26 LAB — COMPREHENSIVE METABOLIC PANEL
ALT: 19 U/L (ref 0–44)
AST: 33 U/L (ref 15–41)
Albumin: 3.5 g/dL (ref 3.5–5.0)
Alkaline Phosphatase: 212 U/L — ABNORMAL HIGH (ref 38–126)
Anion gap: 16 — ABNORMAL HIGH (ref 5–15)
BUN: 20 mg/dL (ref 8–23)
CO2: 17 mmol/L — ABNORMAL LOW (ref 22–32)
Calcium: 9.3 mg/dL (ref 8.9–10.3)
Chloride: 104 mmol/L (ref 98–111)
Creatinine, Ser: 1.16 mg/dL — ABNORMAL HIGH (ref 0.44–1.00)
GFR calc Af Amer: 51 mL/min — ABNORMAL LOW (ref >60)
GFR calc non Af Amer: 44 mL/min — ABNORMAL LOW (ref >60)
Glucose, Bld: 182 mg/dL — ABNORMAL HIGH (ref 70–99)
Potassium: 4.3 mmol/L (ref 3.5–5.1)
Sodium: 137 mmol/L (ref 135–145)
Total Bilirubin: 0.6 mg/dL (ref 0.3–1.2)
Total Protein: 7.2 g/dL (ref 6.5–8.1)

## 2018-10-26 LAB — DIFFERENTIAL
Abs Immature Granulocytes: 0.01 10*3/uL (ref 0.00–0.07)
Basophils Absolute: 0 10*3/uL (ref 0.0–0.1)
Basophils Relative: 1 %
Eosinophils Absolute: 0.1 10*3/uL (ref 0.0–0.5)
Eosinophils Relative: 3 %
Immature Granulocytes: 0 %
Lymphocytes Relative: 27 %
Lymphs Abs: 1.5 10*3/uL (ref 0.7–4.0)
Monocytes Absolute: 0.6 10*3/uL (ref 0.1–1.0)
Monocytes Relative: 10 %
Neutro Abs: 3.4 10*3/uL (ref 1.7–7.7)
Neutrophils Relative %: 59 %

## 2018-10-26 LAB — SARS CORONAVIRUS 2 BY RT PCR (HOSPITAL ORDER, PERFORMED IN ~~LOC~~ HOSPITAL LAB): SARS Coronavirus 2: NEGATIVE

## 2018-10-26 LAB — APTT: aPTT: 38 seconds — ABNORMAL HIGH (ref 24–36)

## 2018-10-26 LAB — GLUCOSE, CAPILLARY: Glucose-Capillary: 125 mg/dL — ABNORMAL HIGH (ref 70–99)

## 2018-10-26 MED ORDER — SODIUM CHLORIDE 0.9 % IV SOLN
50.0000 mL/h | INTRAVENOUS | Status: DC
  Administered 2018-10-26: 50 mL/h via INTRAVENOUS

## 2018-10-26 MED ORDER — PANTOPRAZOLE SODIUM 40 MG IV SOLR
40.0000 mg | Freq: Every day | INTRAVENOUS | Status: DC
  Administered 2018-10-26: 40 mg via INTRAVENOUS

## 2018-10-26 MED ORDER — LABETALOL HCL 5 MG/ML IV SOLN: 10.0000 mg | Freq: Once | INTRAVENOUS | Status: DC | PRN

## 2018-10-26 MED ORDER — ACETAMINOPHEN 160 MG/5ML PO SOLN: 650.0000 mg | ORAL | Status: DC | PRN

## 2018-10-26 MED ORDER — ACETAMINOPHEN 650 MG RE SUPP: 650.0000 mg | RECTAL | Status: DC | PRN

## 2018-10-26 MED ORDER — ACETAMINOPHEN 325 MG PO TABS
650.0000 mg | ORAL_TABLET | ORAL | Status: DC | PRN
  Administered 2018-10-29 – 2018-10-30 (×3): 650 mg via ORAL
  Filled 2018-10-26 (×3): qty 2

## 2018-10-26 MED ORDER — IOHEXOL 350 MG/ML SOLN
75.0000 mL | Freq: Once | INTRAVENOUS | Status: AC | PRN
  Administered 2018-10-26: 75 mL via INTRAVENOUS

## 2018-10-26 MED ORDER — SODIUM CHLORIDE 0.9% FLUSH
3.0000 mL | Freq: Once | INTRAVENOUS | Status: DC
Start: 2018-10-26 — End: 2018-10-30

## 2018-10-26 MED ORDER — NICARDIPINE HCL IN NACL 20-0.86 MG/200ML-% IV SOLN
0.0000 mg/h | INTRAVENOUS | Status: DC | PRN
  Filled 2018-10-26: qty 200

## 2018-10-26 MED ORDER — STROKE: EARLY STAGES OF RECOVERY BOOK
Freq: Once | Status: DC
  Filled 2018-10-26: qty 1

## 2018-10-26 MED ORDER — SODIUM CHLORIDE 0.9 % IV SOLN: 50.0000 mL | Freq: Once | INTRAVENOUS | Status: DC

## 2018-10-26 MED ORDER — ALTEPLASE (STROKE) FULL DOSE INFUSION
0.9000 mg/kg | Freq: Once | INTRAVENOUS | Status: AC
  Administered 2018-10-26: 84.2 mg via INTRAVENOUS
  Filled 2018-10-26: qty 100

## 2018-10-26 MED ORDER — INSULIN ASPART 100 UNIT/ML ~~LOC~~ SOLN
0.0000 [IU] | Freq: Three times a day (TID) | SUBCUTANEOUS | Status: DC
  Administered 2018-10-27 (×2): 5 [IU] via SUBCUTANEOUS
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
## 2018-10-26 NOTE — Progress Notes (Signed)
PHARMACIST CODE STROKE RESPONSE  Notified to mix tPA at 1729 by Dr. Leonel Ramsay Delivered tPA to RN at 1732  tPA dose = 8.4 mg bolus over 1 minute followed by 75.8 mg for a total dose of 84.2 mg over 1 hour  Janae Bridgeman, PharmD PGY1 Pharmacy Resident Phone: [phone] 10/26/2018 5:43 PM

## 2018-10-26 NOTE — ED Notes (Signed)
Husband: [phone] Granddaughter (POA): [phone]

## 2018-10-26 NOTE — Code Documentation (Signed)
Code Stroke   Arrived at 6269, Per EMS, RT sided weakness in RUE and RLE. Fell at home over 3 weeks ago and might have had a concussion from that fall. Arrived with RT sided weakness, dysarthria, and aphasia. Initial NIH 15, improved to 13 in CT - RUE weakness improved while in CT. LSN was 1500, this was confirmed with the patient and the EDP and Neuro MD also called the patient's husband. CT Head was done and decision made to TPA at 1722. TPA was started at 1734. NIH improved to 11. Per MD, there was motor apraxia (tongue) and alexia as well.   Plan: Admit to [place], see orders   End Time 1830

## 2018-10-27 ENCOUNTER — Inpatient Hospital Stay (HOSPITAL_COMMUNITY): Payer: Medicare Other

## 2018-10-27 ENCOUNTER — Encounter (HOSPITAL_COMMUNITY): Payer: Medicare Other

## 2018-10-27 DIAGNOSIS — F0781 Postconcussional syndrome: Secondary | ICD-10-CM

## 2018-10-27 DIAGNOSIS — E785 Hyperlipidemia, unspecified: Secondary | ICD-10-CM

## 2018-10-27 DIAGNOSIS — E1159 Type 2 diabetes mellitus with other circulatory complications: Secondary | ICD-10-CM

## 2018-10-27 DIAGNOSIS — Z794 Long term (current) use of insulin: Secondary | ICD-10-CM

## 2018-10-27 LAB — GLUCOSE, CAPILLARY
Glucose-Capillary: 106 mg/dL — ABNORMAL HIGH (ref 70–99)
Glucose-Capillary: 223 mg/dL — ABNORMAL HIGH (ref 70–99)
Glucose-Capillary: 210 mg/dL — ABNORMAL HIGH (ref 70–99)
Glucose-Capillary: 407 mg/dL — ABNORMAL HIGH (ref 70–99)
Glucose-Capillary: 107 mg/dL — ABNORMAL HIGH (ref 70–99)

## 2018-10-27 LAB — CBC
HCT: 34 % — ABNORMAL LOW (ref 36.0–46.0)
Hemoglobin: 11.5 g/dL — ABNORMAL LOW (ref 12.0–15.0)
MCH: 30.2 pg (ref 26.0–34.0)
MCHC: 33.8 g/dL (ref 30.0–36.0)
MCV: 89.2 fL (ref 80.0–100.0)
Platelets: 141 10*3/uL — ABNORMAL LOW (ref 150–400)
RBC: 3.81 MIL/uL — ABNORMAL LOW (ref 3.87–5.11)
RDW: 13.7 % (ref 11.5–15.5)
WBC: 5.8 10*3/uL (ref 4.0–10.5)
nRBC: 0 % (ref 0.0–0.2)

## 2018-10-27 LAB — LIPID PANEL
Cholesterol: 159 mg/dL (ref 0–200)
HDL: 46 mg/dL (ref >40)
LDL Cholesterol: 93 mg/dL (ref 0–99)
Total CHOL/HDL Ratio: 3.5 RATIO
Triglycerides: 102 mg/dL (ref <150)
VLDL: 20 mg/dL (ref 0–40)

## 2018-10-27 LAB — HEMOGLOBIN A1C
Hgb A1c MFr Bld: 7.6 % — ABNORMAL HIGH (ref 4.8–5.6)
Mean Plasma Glucose: 171.42 mg/dL

## 2018-10-27 MED ORDER — LEVOTHYROXINE SODIUM 25 MCG PO TABS
137.0000 ug | ORAL_TABLET | Freq: Every day | ORAL | Status: DC
  Administered 2018-10-27 – 2018-10-28 (×2): 137 ug via ORAL
  Filled 2018-10-27 (×2): qty 1

## 2018-10-27 MED ORDER — INSULIN ASPART 100 UNIT/ML ~~LOC~~ SOLN
4.0000 [IU] | Freq: Three times a day (TID) | SUBCUTANEOUS | Status: DC
  Administered 2018-10-28 – 2018-10-30 (×6): 4 [IU] via SUBCUTANEOUS

## 2018-10-27 MED ORDER — PRESERVISION AREDS 2 PO CAPS: 1.0000 | ORAL_CAPSULE | Freq: Two times a day (BID) | ORAL | Status: DC

## 2018-10-27 MED ORDER — SIMVASTATIN 20 MG PO TABS
40.0000 mg | ORAL_TABLET | Freq: Every day | ORAL | Status: DC
  Administered 2018-10-27 – 2018-10-29 (×3): 40 mg via ORAL
  Filled 2018-10-27 (×3): qty 2

## 2018-10-27 MED ORDER — ASPIRIN EC 81 MG PO TBEC
81.0000 mg | DELAYED_RELEASE_TABLET | Freq: Every day | ORAL | Status: DC
  Administered 2018-10-27 – 2018-10-30 (×4): 81 mg via ORAL
  Filled 2018-10-27 (×4): qty 1

## 2018-10-27 MED ORDER — ONDANSETRON HCL 4 MG/2ML IJ SOLN: 4.0000 mg | Freq: Four times a day (QID) | INTRAMUSCULAR | Status: DC | PRN

## 2018-10-27 MED ORDER — LISINOPRIL 2.5 MG PO TABS
2.5000 mg | ORAL_TABLET | Freq: Every day | ORAL | Status: DC
  Administered 2018-10-27 – 2018-10-29 (×3): 2.5 mg via ORAL
  Filled 2018-10-27 (×3): qty 1

## 2018-10-27 MED ORDER — PROSIGHT PO TABS
1.0000 | ORAL_TABLET | Freq: Every day | ORAL | Status: DC
  Administered 2018-10-27 – 2018-10-30 (×4): 1 via ORAL
  Filled 2018-10-27 (×4): qty 1

## 2018-10-27 MED ORDER — CLOPIDOGREL BISULFATE 75 MG PO TABS
75.0000 mg | ORAL_TABLET | Freq: Every day | ORAL | Status: DC
  Administered 2018-10-27 – 2018-10-30 (×4): 75 mg via ORAL
  Filled 2018-10-27 (×4): qty 1

## 2018-10-27 MED ORDER — ONDANSETRON HCL 4 MG/2ML IJ SOLN
INTRAMUSCULAR | Status: AC
  Administered 2018-10-27: 4 mg
  Filled 2018-10-27: qty 2

## 2018-10-27 MED ORDER — ONDANSETRON HCL 4 MG/2ML IJ SOLN: 4.0000 mg | Freq: Four times a day (QID) | INTRAMUSCULAR | Status: DC

## 2018-10-27 MED ORDER — METOPROLOL SUCCINATE ER 50 MG PO TB24: 50.0000 mg | ORAL_TABLET | Freq: Every day | ORAL | Status: DC

## 2018-10-27 MED ORDER — FUROSEMIDE 40 MG PO TABS: 40.0000 mg | ORAL_TABLET | Freq: Every day | ORAL | Status: DC

## 2018-10-27 MED ORDER — PANTOPRAZOLE SODIUM 40 MG PO TBEC
40.0000 mg | DELAYED_RELEASE_TABLET | Freq: Every day | ORAL | Status: DC
  Administered 2018-10-27 – 2018-10-30 (×4): 40 mg via ORAL
  Filled 2018-10-27 (×4): qty 1

## 2018-10-27 MED ORDER — INSULIN ASPART 100 UNIT/ML ~~LOC~~ SOLN
0.0000 [IU] | Freq: Three times a day (TID) | SUBCUTANEOUS | Status: DC
  Administered 2018-10-27: 15 [IU] via SUBCUTANEOUS
  Administered 2018-10-28 – 2018-10-29 (×3): 3 [IU] via SUBCUTANEOUS
  Administered 2018-10-29: 2 [IU] via SUBCUTANEOUS
  Administered 2018-10-30 (×2): 3 [IU] via SUBCUTANEOUS

## 2018-10-27 MED ORDER — INSULIN ASPART 100 UNIT/ML ~~LOC~~ SOLN: 0.0000 [IU] | Freq: Every day | SUBCUTANEOUS | Status: DC
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
## 2018-10-27 NOTE — Progress Notes (Signed)
OT Cancellation Note  Patient Details Name: Tonya Hunter MRN: [id number] DOB: Nov 10, 1934   Cancelled Treatment:    Reason Eval/Treat Not Completed: Patient not medically ready;Active bedrest order.  Will reattempt.  Lucille Passy, OTR/L [place] Pager [phone] Office [phone]   Lucille Passy M 10/27/2018, 9:37 AM

## 2018-10-27 NOTE — Progress Notes (Signed)
SLP Cancellation Note  Patient Details Name: Tonya Hunter MRN: [id number] DOB: 08/17/1934   Cancelled treatment:       Reason Eval/Treat Not Completed: Patient at procedure or test/unavailable. Radiology transport arrived to take patient for MRI right after SLP arrived for SLE. Will attempt next date if possible.   [name] [name] 10/27/2018, 2:02 PM    Sonia Baller, MA, CCC-SLP Speech Therapy [place] [facility] Pager: [phone]

## 2018-10-27 NOTE — Progress Notes (Signed)
Assisted tele visit to patient with family member.  Tsuneo Faison M, RN  

## 2018-10-27 NOTE — Progress Notes (Signed)
PT Cancellation Note  Patient Details Name: Tonya Hunter MRN: [id number] DOB: Jul 13, 1934   Cancelled Treatment:    Reason Eval/Treat Not Completed: Active bedrest order   Ellamae Sia, PT, DPT Acute Rehabilitation Services Pager [phone] Office [phone]    Willy Eddy 10/27/2018, 7:59 AM
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
## 2018-10-27 NOTE — Evaluation (Signed)
Physical Therapy Evaluation Patient Details Name: Tonya Hunter MRN: [id number] DOB: 12-17-1934 Today's Date: 10/27/2018   History of Present Illness  Pt is a 84 y.o. F with significant PMH of CAD, diabetes type II, MI, knee replacement, and TIA. Now presenting with right sided weakness, treated with tPA. MRI pending.   Clinical Impression  Pt admitted with above. Prior to admission, pt lives with her husband and has been using a walker for mobility for the past 3 weeks since she had a fall. On PT evaluation, pt presents with decreased functional mobility secondary to decreased left sided sensation and balance impairments. Pt fearful/hesitant of mobility and emotionally labile several times throughout session, but overall moving fairly well. Ambulating 15 feet with walker and min guard assist. See below for recommendations.    Follow Up Recommendations Home health PT;Supervision for mobility/OOB    Equipment Recommendations  None recommended by PT    Recommendations for Other Services       Precautions / Restrictions Precautions Precautions: Fall Restrictions Weight Bearing Restrictions: No      Mobility  Bed Mobility Overal bed mobility: Needs Assistance Bed Mobility: Supine to Sit;Sit to Supine     Supine to sit: Min assist Sit to supine: Min assist   General bed mobility comments: Min assist for trunk elevation for sitting up to edge of bed, min assist for LLE negotiation back into bed  Transfers Overall transfer level: Needs assistance Equipment used: Rolling walker (2 wheeled) Transfers: Sit to/from Stand Sit to Stand: Min assist         General transfer comment: Light min assist for initiation of standing  Ambulation/Gait Ambulation/Gait assistance: Min guard Gait Distance (Feet): 15 Feet Assistive device: Rolling walker (2 wheeled) Gait Pattern/deviations: Step-through pattern;Trunk flexed;Decreased stride length     General Gait Details: Min guard for  safety, no overt LOB. Cues for right hand placement on handle of walker  Stairs            Wheelchair Mobility    Modified Rankin (Stroke Patients Only) Modified Rankin (Stroke Patients Only) Pre-Morbid Rankin Score: No significant disability Modified Rankin: Moderately severe disability     Balance Overall balance assessment: Needs assistance Sitting-balance support: Feet supported Sitting balance-Leahy Scale: Fair     Standing balance support: Bilateral upper extremity supported Standing balance-Leahy Scale: Poor                               Pertinent Vitals/Pain Pain Assessment: No/denies pain    Home Living Family/patient expects to be discharged to:: Private residence Living Arrangements: Spouse/significant other Available Help at Discharge: Family;Available 24 hours/day Type of Home: House Home Access: Stairs to enter Entrance Stairs-Rails: Left Entrance Stairs-Number of Steps: 3("a couple") Home Layout: One level Home Equipment: Environmental consultant - 2 wheels;Cane - single point      Prior Function Level of Independence: Independent with assistive device(s)         Comments: has been using walker since fall 3 weeks ago, pt generally traverses her steps sideways     Hand Dominance   Dominant Hand: Right    Extremity/Trunk Assessment   Upper Extremity Assessment Upper Extremity Assessment: Defer to OT evaluation;RUE deficits/detail;LUE deficits/detail RUE Sensation: decreased light touch RUE Coordination: decreased gross motor LUE Deficits / Details: History of L wrist fracture 1.5 years ago, deformity, AROM shoulder flexion to ~90    Lower Extremity Assessment Lower Extremity Assessment: RLE deficits/detail;LLE deficits/detail  RLE Deficits / Details: Grossly 4/5 RLE Sensation: decreased light touch LLE Deficits / Details: Grossly 4/5       Communication   Communication: No difficulties  Cognition Arousal/Alertness:  Awake/alert Behavior During Therapy: WFL for tasks assessed/performed Overall Cognitive Status: No family/caregiver present to determine baseline cognitive functioning                                 General Comments: Pt alert, oriented x 4. Following multi step commands. Emotionally labile throughout session and fearful with mobility.       General Comments      Exercises     Assessment/Plan    PT Assessment Patient needs continued PT services  PT Problem List Decreased strength;Decreased activity tolerance;Decreased balance;Decreased mobility;Decreased coordination       PT Treatment Interventions DME instruction;Gait training;Stair training;Functional mobility training;Therapeutic activities;Therapeutic exercise;Balance training;Patient/family education    PT Goals (Current goals can be found in the Care Plan section)  Acute Rehab PT Goals Patient Stated Goal: "get back to gardening." PT Goal Formulation: With patient Time For Goal Achievement: 11/10/18 Potential to Achieve Goals: Good    Frequency Min 4X/week   Barriers to discharge        Co-evaluation               AM-PAC PT "6 Clicks" Mobility  Outcome Measure Help needed turning from your back to your side while in a flat bed without using bedrails?: None Help needed moving from lying on your back to sitting on the side of a flat bed without using bedrails?: A Little Help needed moving to and from a bed to a chair (including a wheelchair)?: A Little Help needed standing up from a chair using your arms (e.g., wheelchair or bedside chair)?: A Little Help needed to walk in hospital room?: A Little Help needed climbing 3-5 steps with a railing? : A Lot 6 Click Score: 18    End of Session Equipment Utilized During Treatment: Gait belt Activity Tolerance: Patient tolerated treatment well Patient left: in bed;with call bell/phone within reach;with bed alarm set Nurse Communication: Mobility  status PT Visit Diagnosis: Unsteadiness on feet (R26.81);Muscle weakness (generalized) (M62.81);Difficulty in walking, not elsewhere classified (R26.2);Other symptoms and signs involving the nervous system (R29.898)    Time: 8675-4492 PT Time Calculation (min) (ACUTE ONLY): 36 min   Charges:   PT Evaluation $PT Eval Moderate Complexity: 1 Mod PT Treatments $Therapeutic Activity: 8-22 mins        Ellamae Sia, PT, DPT Acute Rehabilitation Services Pager [phone] Office [phone]   Willy Eddy 10/27/2018, 11:32 AM

## 2018-10-27 NOTE — Progress Notes (Signed)
Received from 4N-ICU via wheelchair; transferred into bed by staff. Oriented to room and unit routine.

## 2018-10-27 NOTE — Progress Notes (Signed)
STROKE TEAM PROGRESS NOTE   SUBJECTIVE (INTERVAL HISTORY) Her RN is at the bedside.  Pt is awake alert and told me that she had recent head injury and has seen Dr. Leta Baptist 3 weeks ago for post concussion syndrome. She remembered that she can not get out of car but stated that she was confused and not remember the details. But as per Dr. Leonel Ramsay, patient had dense right neglect, aphasia and right sided mild weakness on presentation.  As per RN, pt neuro symptoms improved back to normal around 1am overnight.    OBJECTIVE Vitals:   10/27/18 1100 10/27/18 1200 10/27/18 1300 10/27/18 1400  BP: 136/68 127/70 112/81 (!) 108/94  Pulse:   85 83  Resp: 19 (!) 21 16 14   Temp:  98.1 F (36.7 C)    TempSrc:  Oral    SpO2: 99% 98% 96% 96%  Weight:      Height:        CBC:  Recent Labs  Lab 10/26/18 1716 10/26/18 1725 10/27/18 0548  WBC 5.6  --  5.8  NEUTROABS 3.4  --   --   HGB 12.4 13.3 11.5*  HCT 37.9 39.0 34.0*  MCV 91.8  --  89.2  PLT 159  --  141*    Basic Metabolic Panel:  Recent Labs  Lab 10/26/18 1716 10/26/18 1725  NA 137 136  K 4.3 4.2  CL 104 104  CO2 17*  --   GLUCOSE 182* 177*  BUN 20 19  CREATININE 1.16* 1.00  CALCIUM 9.3  --     Lipid Panel:     Component Value Date/Time   CHOL 159 10/27/2018 0548   TRIG 102 10/27/2018 0548   HDL 46 10/27/2018 0548   CHOLHDL 3.5 10/27/2018 0548   VLDL 20 10/27/2018 0548   LDLCALC 93 10/27/2018 0548   HgbA1c:  Lab Results  Component Value Date   HGBA1C 7.6 (H) 10/27/2018   Urine Drug Screen:     Component Value Date/Time   LABOPIA NONE DETECTED 06/09/2016 1146   COCAINSCRNUR NONE DETECTED 06/09/2016 1146   LABBENZ NONE DETECTED 06/09/2016 1146   AMPHETMU NONE DETECTED 06/09/2016 1146   THCU NONE DETECTED 06/09/2016 1146   LABBARB NONE DETECTED 06/09/2016 1146    Alcohol Level     Component Value Date/Time   ETH <5 06/09/2016 1228    IMAGING  Ct Angio Head W Or Wo Contrast Ct Angio Neck W Or Wo  Contrast 10/26/2018 IMPRESSION:  No emergent large vessel occlusion or hemodynamically significant stenosis by NASCET criteria.  Ct Head Code Stroke Wo Contrast 10/26/2018 IMPRESSION:  1. No intracranial hemorrhage or mass effect.  2. ASPECTS is 10.   Transthoracic Echocardiogram  09/21/2018 IMPRESSIONS  1. Severe akinesis of the left ventricular, entire anteroseptal wall.  2. The left ventricle has mild-moderately reduced systolic function, with an ejection fraction of 40-45%. The cavity size was normal. There is moderately increased left ventricular wall thickness. Findings are consistent with ischemic cardiomyopathy.  Left ventricular diastolic Doppler parameters are consistent with impaired relaxation.  3. The right ventricle has normal systolic function. The cavity was normal. There is no increase in right ventricular wall thickness.  4. Left atrial size was mildly dilated.  5. The mitral valve is degenerative. Moderate thickening of the mitral valve leaflet. Moderate calcification of the mitral valve leaflet. No evidence of mitral valve stenosis.  6. The aortic valve is tricuspid. Moderate thickening of the aortic valve. Moderate calcification of the aortic  valve. Mild-moderate stenosis of the aortic valve.  7. Aortic stenosis is unchanged from prior.  8. The aortic root is normal in size and structure.   2D Echocardiogram Limited Study with bubble 10/27/2018 Pending   EEG pending  EKG - SR rate 81BPM. RBBB. (See cardiology reading for complete details)   PHYSICAL EXAM  Temp:  [97.3 F (36.3 C)-98.4 F (36.9 C)] 98.1 F (36.7 C) (06/13 1200) Pulse Rate:  [46-88] 83 (06/13 1400) Resp:  [10-26] 14 (06/13 1400) BP: (100-168)/(51-94) 108/94 (06/13 1400) SpO2:  [93 %-100 %] 96 % (06/13 1400) Weight:  [93.5 kg] 93.5 kg (06/12 1744)  General - Well nourished, well developed, in no apparent distress.  Ophthalmologic - fundi not visualized due to  noncooperation.  Cardiovascular - Regular rate and rhythm with frequent PACs.  Mental Status -  Level of arousal and orientation to place, age, self and person were intact, but not orientated to time. Language including expression, repetition, comprehension was assessed and found intact. Naming 3/4  Cranial Nerves II - XII - II - Visual field intact OU. III, IV, VI - Extraocular movements intact. V - Facial sensation intact bilaterally. VII - Facial movement intact bilaterally. VIII - Hearing & vestibular intact bilaterally. X - Palate elevates symmetrically. XI - Chin turning & shoulder shrug intact bilaterally. XII - Tongue protrusion intact.  Motor Strength - The patient's strength was normal in all extremities and pronator drift was absent.  Bulk was normal and fasciculations were absent   Motor Tone - Muscle tone was assessed at the neck and appendages and was normal.  Reflexes - The patient's reflexes were symmetrical in all extremities and she had no pathological reflexes.  Sensory - Light touch, temperature/pinprick were assessed and were symmetrical.    Coordination - The patient had normal movements in the hands with no ataxia or dysmetria.  Tremor was absent.  Gait and Station - deferred.    ASSESSMENT/PLAN Tonya Hunter is a 84 y.o. female with history of DM, CAD with MI and stent, fell and hit her head about 3 weeks ago, esophogeal varices, Htn, Hld, TIA hx and aortic stenosis presenting with right sided weakness. tpa given Friday 10/26/2018 at 1745.  Suspected Stroke s/p tPA  Resultant back to baseline, however still has disorientation  CT head - No intracranial hemorrhage or mass effect.   MRI head - pending  CTA H&N - unremarkable  2D Echo - 09/21/2018 - Ef 40 - 45% (ischemic CM)  2D Echo limited study with bubble - pending  LDL - 93  HgbA1c - 7.6  VTE prophylaxis - SCDs  Diet - NPO  aspirin 325 mg daily and clopidogrel 75 mg daily prior to  admission, now on No antithrombotic within 24 hours S/P tPA  Patient will be counseled to be compliant with her antithrombotic medications  Ongoing aggressive stroke risk factor management  Therapy recommendations:  pending  Disposition:  Pending  Postconcussion syndrome  08/2018 patient had fall with headache, forehead numbness and rib fracture  Has been following with [facility]. Leta [facility] at [facility]  Last seen on 10/04/2018  Currently still has mild disorientation, cognitive impairment, not sure if patient baseline or postconcussion  Continue follow-up with Dr. Leta Baptist  Hypertension  Stable . Permissive hypertension (OK if <180/105) post TPA . Long-term BP goal normotensive  Hyperlipidemia  Lipid lowering medication PTA:  Zocor 20 mg daily  LDL 93, goal < 70  Current lipid lowering medication: increase Zocor to 40 mg  daily  Continue statin at discharge  Diabetes  HgbA1c 7.6, goal < 7.0  Uncontrolled   SSI   CBG monitoring  Other Stroke Risk Factors  Advanced age  Obesity, Body mass index is 35.38 kg/m., recommend weight loss, diet and exercise as appropriate   Hx of TIA  Coronary artery disease/MI  Other Active Problems  Hospital day # 1  This patient is critically ill due to suspected stroke, status post TPA, postconcussion syndrome, diabetes and at significant risk of neurological worsening, death form recurrent stroke, hemorrhagic conversion, seizure, heart failure, DKA. This patient's care requires constant monitoring of vital signs, hemodynamics, respiratory and cardiac monitoring, review of multiple databases, neurological assessment, discussion with family, other specialists and medical decision making of high complexity. I spent 35 minutes of neurocritical care time in the care of this patient.  Rosalin Hawking, MD PhD Stroke Neurology 10/27/2018 2:33 PM    To contact Stroke Continuity provider, please refer to [email or web address] After hours, contact General  Neurology

## 2018-10-28 ENCOUNTER — Inpatient Hospital Stay (HOSPITAL_COMMUNITY): Payer: Medicare Other

## 2018-10-28 DIAGNOSIS — I639 Cerebral infarction, unspecified: Secondary | ICD-10-CM

## 2018-10-28 DIAGNOSIS — R002 Palpitations: Secondary | ICD-10-CM

## 2018-10-28 DIAGNOSIS — I25119 Atherosclerotic heart disease of native coronary artery with unspecified angina pectoris: Secondary | ICD-10-CM

## 2018-10-28 DIAGNOSIS — I10 Essential (primary) hypertension: Secondary | ICD-10-CM

## 2018-10-28 DIAGNOSIS — I63412 Cerebral infarction due to embolism of left middle cerebral artery: Principal | ICD-10-CM

## 2018-10-28 LAB — GLUCOSE, CAPILLARY
Glucose-Capillary: 112 mg/dL — ABNORMAL HIGH (ref 70–99)
Glucose-Capillary: 199 mg/dL — ABNORMAL HIGH (ref 70–99)
Glucose-Capillary: 166 mg/dL — ABNORMAL HIGH (ref 70–99)
Glucose-Capillary: 149 mg/dL — ABNORMAL HIGH (ref 70–99)

## 2018-10-28 MED ORDER — SENNOSIDES-DOCUSATE SODIUM 8.6-50 MG PO TABS
1.0000 | ORAL_TABLET | Freq: Two times a day (BID) | ORAL | Status: DC
  Administered 2018-10-28 – 2018-10-30 (×5): 1 via ORAL
  Filled 2018-10-28 (×5): qty 1

## 2018-10-28 NOTE — Progress Notes (Signed)
VASCULAR LAB PRELIMINARY  PRELIMINARY  PRELIMINARY  PRELIMINARY  Bilateral lower extremity venous duplex completed.    Preliminary report:  See CV proc for preliminary results.   Annaleia Pence, RVT 10/28/2018, 12:09 PM

## 2018-10-28 NOTE — Evaluation (Signed)
Occupational Therapy Evaluation Patient Details Name: Tonya Hunter MRN: [id number] DOB: 1934/08/05 Today's Date: 10/28/2018    History of Present Illness Pt is a 84 y.o. F with significant PMH of CAD, diabetes type II, MI, knee replacement, and TIA. Now presenting with right sided weakness, treated with tPA. MRI reveals "Acute/subacute nonhemorrhagic infarct of the posterior left MCA division involving the posterior left insular cortex, sylvian   Clinical Impression   PTA patient reports independent with ADLs, managing meds and driving, using RW for mobility.  Admitted for above and limited by problem list below, including impaired cognition, decreased activity tolerance, impaired balance.  She reports a fall 3 weeks ago with concussion, and has been using RW since.  Cognitively, presents with ability to follow commands given increased time but inconsistent with multi-step commands and sequencing, decreased STM, but unclear of baseline since concussion. Attempted short blessed test but unable to complete, as pt could not repeat name/address within 3 trials.  Patient requires setup for UB ADLs, min assist for LB ADls, min guard for toilet transfers and min guard for toileting.  She will need 24/7 support, will continue assessment with functional cognition using pill box test next session. Patient will benefit from continued OT services while admitted and after dc at [facility] level in order to optimize independence and safety with ADLs/mobility.     Follow Up Recommendations  Home health OT;Supervision/Assistance - 24 hour    Equipment Recommendations  3 in 1 bedside commode    Recommendations for Other Services       Precautions / Restrictions Precautions Precautions: Fall Restrictions Weight Bearing Restrictions: No      Mobility Bed Mobility Overal bed mobility: Needs Assistance Bed Mobility: Supine to Sit;Sit to Supine     Supine to sit: Min guard Sit to supine: Min guard    General bed mobility comments: min guard for safety and balance  Transfers Overall transfer level: Needs assistance Equipment used: Rolling walker (2 wheeled) Transfers: Sit to/from [facility] Sit to Stand: Min guard Stand pivot transfers: Min guard       General transfer comment: min guard for safety and balance, cueing for hand placement and technique    Balance Overall balance assessment: Needs assistance Sitting-balance support: No upper extremity supported;Feet supported Sitting balance-Leahy Scale: Fair     Standing balance support: Bilateral upper extremity supported;During functional activity Standing balance-Leahy Scale: Poor Standing balance comment: relaint on B UE support                           ADL either performed or assessed with clinical judgement   ADL Overall ADL's : Needs assistance/impaired     Grooming: Set up;Sitting   Upper Body Bathing: Sitting;Supervision/ safety   Lower Body Bathing: Sit to/from stand;Minimal assistance   Upper Body Dressing : Set up;Sitting   Lower Body Dressing: Minimal assistance;Sit to/from stand   Toilet Transfer: Min guard;Stand-pivot;RW;BSC   Toileting- Water quality scientist and Hygiene: Min guard;Sit to/from stand       Functional mobility during ADLs: Min guard;Rolling walker General ADL Comments: pt limited by weakness, impaired balance and cognition     Vision Baseline Vision/History: Wears glasses Wears Glasses: Reading only Patient Visual Report: No change from baseline Vision Assessment?: No apparent visual deficits     Perception     Praxis      Pertinent Vitals/Pain Pain Assessment: No/denies pain     Hand Dominance Right  Extremity/Trunk Assessment Upper Extremity Assessment Upper Extremity Assessment: RUE deficits/detail;LUE deficits/detail RUE Deficits / Details: grossly 3+/5 MMT  RUE Sensation: (further assessment ) RUE Coordination: WNL LUE Deficits /  Details: hx of wrist fracture (deformity), grossly 3+/5 MMT  LUE Sensation: (further assessment)   Lower Extremity Assessment Lower Extremity Assessment: Defer to PT evaluation       Communication Communication Communication: No difficulties   Cognition Arousal/Alertness: Awake/alert Behavior During Therapy: WFL for tasks assessed/performed Overall Cognitive Status: Impaired/Different from baseline Area of Impairment: Orientation;Attention;Memory;Following commands;Safety/judgement;Awareness;Problem solving                 Orientation Level: (oriented with choices) Current Attention Level: Sustained Memory: Decreased recall of precautions;Decreased short-term memory Following Commands: Follows one step commands consistently;Follows one step commands with increased time;Follows multi-step commands inconsistently Safety/Judgement: Decreased awareness of safety;Decreased awareness of deficits Awareness: Emergent Problem Solving: Slow processing;Decreased initiation;Difficulty sequencing;Requires verbal cues;Requires tactile cues General Comments: Pt oriented but requires choices to verbalize year, she is able to follow commands given increased time (inconsistent with multiple step), anxious with mobility; attempted short blessed test (unable to score, as cannot repeat name/address in 3 trials) with significant difficulties noted in sequencing    General Comments  noted brusing on BLEs and UEs    Exercises     Shoulder Instructions      Home Living Family/patient expects to be discharged to:: Private residence Living Arrangements: Spouse/significant other Available Help at Discharge: Family;Available 24 hours/day Type of Home: House Home Access: Stairs to enter [employer] of Steps: 3 Entrance Stairs-Rails: Left Home Layout: One level     Bathroom Shower/Tub: Occupational psychologist: Standard     Home Equipment: Environmental consultant - 2 wheels;Cane - single  point          Prior Functioning/Environment Level of Independence: Independent with assistive device(s)        Comments: has been using walker since fall 3 weeks ago, independent with ADLs, driving and managing meds        OT Problem List: Decreased strength;Decreased activity tolerance;Decreased coordination;Decreased cognition;Decreased safety awareness;Decreased knowledge of use of DME or AE;Impaired balance (sitting and/or standing);Decreased knowledge of precautions      OT Treatment/Interventions: Self-care/ADL training;Therapeutic exercise;DME and/or AE instruction;Therapeutic activities;Cognitive remediation/compensation;Patient/family education;Balance training    OT Goals(Current goals can be found in the care plan section) Acute Rehab OT Goals Patient Stated Goal: "get back to gardening." OT Goal Formulation: With patient Time For Goal Achievement: 11/11/18 Potential to Achieve Goals: Good  OT Frequency: Min 2X/week   Barriers to D/C:            Co-evaluation              AM-PAC OT "6 Clicks" Daily Activity     Outcome Measure Help from another person eating meals?: None Help from another person taking care of personal grooming?: None(seated) Help from another person toileting, which includes using toliet, bedpan, or urinal?: A Little Help from another person bathing (including washing, rinsing, drying)?: A Little Help from another person to put on and taking off regular upper body clothing?: A Little Help from another person to put on and taking off regular lower body clothing?: A Lot 6 Click Score: 19   End of Session Equipment Utilized During Treatment: Rolling walker Nurse Communication: Mobility status  Activity Tolerance: Patient tolerated treatment well Patient left: in bed;with call bell/phone within reach;with bed alarm set  OT Visit Diagnosis: Other abnormalities of gait and  mobility (R26.89);History of falling (Z91.81);Other symptoms and  signs involving cognitive function;Cognitive communication deficit (R41.841) Symptoms and signs involving cognitive functions: Cerebral infarction                Time: 4695-0722 OT Time Calculation (min): 23 min Charges:  OT General Charges $OT Visit: 1 Visit OT Evaluation $OT Eval Moderate Complexity: 1 Mod OT Treatments $Self Care/Home Management : 8-22 mins  Delight Stare, OT Acute Rehabilitation Services Pager [phone] Office [phone]   Delight Stare 10/28/2018, 12:54 PM

## 2018-10-28 NOTE — Progress Notes (Signed)
STROKE TEAM PROGRESS NOTE   SUBJECTIVE (INTERVAL HISTORY) Her RN is at the bedside.  Pt is awake alert and talkative. However, she complains some word finding difficulties and memory deficit since this admission due to stroke. She has agreed with loop recorder tomorrow. PT/OT recommend [facility] PT/OT.    OBJECTIVE Vitals:   10/27/18 1841 10/27/18 2048 10/27/18 2322 10/28/18 0329  BP: (!) 145/69 (!) 149/77 (!) 144/80 112/67  Pulse: 91 86 89 91  Resp: 18 16 18 17   Temp: 98.9 F (37.2 C) 99 F (37.2 C) 98.9 F (37.2 C) 98 F (36.7 C)  TempSrc: Oral Oral Oral Oral  SpO2: 98% 98% 98% 94%  Weight:      Height:        CBC:  Recent Labs  Lab 10/26/18 1716 10/26/18 1725 10/27/18 0548  WBC 5.6  --  5.8  NEUTROABS 3.4  --   --   HGB 12.4 13.3 11.5*  HCT 37.9 39.0 34.0*  MCV 91.8  --  89.2  PLT 159  --  141*    Basic Metabolic Panel:  Recent Labs  Lab 10/26/18 1716 10/26/18 1725  NA 137 136  K 4.3 4.2  CL 104 104  CO2 17*  --   GLUCOSE 182* 177*  BUN 20 19  CREATININE 1.16* 1.00  CALCIUM 9.3  --     Lipid Panel:     Component Value Date/Time   CHOL 159 10/27/2018 0548   TRIG 102 10/27/2018 0548   HDL 46 10/27/2018 0548   CHOLHDL 3.5 10/27/2018 0548   VLDL 20 10/27/2018 0548   LDLCALC 93 10/27/2018 0548   HgbA1c:  Lab Results  Component Value Date   HGBA1C 7.6 (H) 10/27/2018   Urine Drug Screen:     Component Value Date/Time   LABOPIA NONE DETECTED 06/09/2016 1146   COCAINSCRNUR NONE DETECTED 06/09/2016 1146   LABBENZ NONE DETECTED 06/09/2016 1146   AMPHETMU NONE DETECTED 06/09/2016 1146   THCU NONE DETECTED 06/09/2016 1146   LABBARB NONE DETECTED 06/09/2016 1146    Alcohol Level     Component Value Date/Time   ETH <5 06/09/2016 1228    IMAGING  Ct Angio Head W Or Wo Contrast Ct Angio Neck W Or Wo Contrast 10/26/2018 IMPRESSION:  No emergent large vessel occlusion or hemodynamically significant stenosis by NASCET criteria.  Ct Head Code Stroke Wo  Contrast 10/26/2018 IMPRESSION:  1. No intracranial hemorrhage or mass effect.  2. ASPECTS is 10.   Transthoracic Echocardiogram  09/21/2018 IMPRESSIONS  1. Severe akinesis of the left ventricular, entire anteroseptal wall.  2. The left ventricle has mild-moderately reduced systolic function, with an ejection fraction of 40-45%. The cavity size was normal. There is moderately increased left ventricular wall thickness. Findings are consistent with ischemic cardiomyopathy.  Left ventricular diastolic Doppler parameters are consistent with impaired relaxation.  3. The right ventricle has normal systolic function. The cavity was normal. There is no increase in right ventricular wall thickness.  4. Left atrial size was mildly dilated.  5. The mitral valve is degenerative. Moderate thickening of the mitral valve leaflet. Moderate calcification of the mitral valve leaflet. No evidence of mitral valve stenosis.  6. The aortic valve is tricuspid. Moderate thickening of the aortic valve. Moderate calcification of the aortic valve. Mild-moderate stenosis of the aortic valve.  7. Aortic stenosis is unchanged from prior.  8. The aortic root is normal in size and structure.  LE venous doppler  No DVT  EKG -  SR rate 81BPM. RBBB. (See cardiology reading for complete details)   PHYSICAL EXAM  Temp:  [97.7 F (36.5 C)-99 F (37.2 C)] 98 F (36.7 C) (06/14 0329) Pulse Rate:  [46-91] 91 (06/14 0329) Resp:  [13-21] 17 (06/14 0329) BP: (108-168)/(67-94) 112/67 (06/14 0329) SpO2:  [94 %-100 %] 94 % (06/14 0329)  General - Well nourished, well developed, in no apparent distress.  Ophthalmologic - fundi not visualized due to noncooperation.  Cardiovascular - Regular rate and rhythm with frequent PACs.  Mental Status -  Level of arousal and orientation to place, age, month and person were intact, but not orientated to year. Language including expression, repetition, comprehension was assessed and  found intact. Naming 3/4, mild hesitation intermittently.   Cranial Nerves II - XII - II - Visual field intact OU. III, IV, VI - Extraocular movements intact. V - Facial sensation intact bilaterally. VII - Facial movement intact bilaterally. VIII - Hearing & vestibular intact bilaterally. X - Palate elevates symmetrically. XI - Chin turning & shoulder shrug intact bilaterally. XII - Tongue protrusion intact.  Motor Strength - The patient's strength was normal in all extremities and pronator drift was absent.  Bulk was normal and fasciculations were absent   Motor Tone - Muscle tone was assessed at the neck and appendages and was normal.  Reflexes - The patient's reflexes were symmetrical in all extremities and she had no pathological reflexes.  Sensory - Light touch, temperature/pinprick were assessed and were symmetrical.    Coordination - The patient had normal movements in the hands with no ataxia or dysmetria.  Tremor was absent.  Gait and Station - deferred.    ASSESSMENT/PLAN [name] is a 84 y.o. female with history of DM, CAD with MI and stent, fell and hit her head about 3 weeks ago, esophogeal varices, Htn, Hld, TIA hx and aortic stenosis presenting with right sided weakness. tpa given Friday 10/26/2018 at 1745.  Stroke - left MCA infarcts s/p tPA, embolic pattern, concerning for cardioembolic source  Resultant back to baseline, however still has disorientation  CT head - No intracranial hemorrhage or mass effect.   MRI head -  Left MCA patchy multifocal infarcts  CTA H&N - unremarkable  2D Echo - 09/21/2018 - EF 40 - 45% (ischemic CM)  LE venous doppler - neg for DVT  Recommend loop recorder to rule out A. fib  LDL - 93  HgbA1c - 7.6  VTE prophylaxis - SCDs  Diet - NPO  aspirin 325 mg daily and clopidogrel 75 mg daily prior to admission, now on back on plavix and ASA 81. Continue DAPT on discharge.   Patient will be counseled to be compliant  with her antithrombotic medications  Ongoing aggressive stroke risk factor management  Therapy recommendations: [facility] PT/OT  Disposition:  Pending  Heart palpitation Hx of CAD s/p stenting  CAD s/p DES to LAD (2007) s/p DES for ISR of LAD (2011)  Followed with Dr. Johnsie Cancel  Palpitations s/p event monitor in 2018 showed PACs - put on beta-blocker  Given current embolic stroke, recommend loop recorder to rule out A. fib  On low-dose lisinopril  Entresto intolerance  Postconcussion syndrome  08/2018 patient had fall with headache, forehead numbness and rib fracture  Has been following with Dr. Leta Baptist at [place] seen on 10/04/2018  Continue follow-up with Dr. Leta Baptist  Hypertension  Stable . Long-term BP goal normotensive  Hyperlipidemia  Lipid lowering medication PTA:  Zocor 20 mg daily  LDL 93, goal < 70  Current lipid lowering medication: increase Zocor to 40 mg daily  Continue statin at discharge  Diabetes  HgbA1c 7.6, goal < 7.0  Uncontrolled   SSI   CBG monitoring  Other Stroke Risk Factors  Advanced age  Obesity, Body mass index is 35.38 kg/m., recommend weight loss, diet and exercise as appropriate   Hx of TIA  Coronary artery disease/MI  Other Active Problems  GI bleed 2018 with banding grade 2 varices on 07/07/16 by Dr. Amedeo Plenty.  Hospital day # 2  I tried to call pt husband and give him update, however, the number on file went to VM without message. Will try tomorrow.  Rosalin Hawking, MD PhD Stroke Neurology 10/28/2018 5:49 PM  To contact Stroke Continuity provider, please refer to [email or web address] After hours, contact General Neurology

## 2018-10-29 ENCOUNTER — Encounter (HOSPITAL_COMMUNITY)
Admission: EM | Disposition: A | Discharge status: Home Health | Payer: Self-pay | Source: Home / Self Care | Attending: Neurology

## 2018-10-29 DIAGNOSIS — F0781 Postconcussional syndrome: Secondary | ICD-10-CM | POA: Diagnosis present

## 2018-10-29 DIAGNOSIS — I6389 Other cerebral infarction: Secondary | ICD-10-CM

## 2018-10-29 DIAGNOSIS — E119 Type 2 diabetes mellitus without complications: Secondary | ICD-10-CM

## 2018-10-29 DIAGNOSIS — R002 Palpitations: Secondary | ICD-10-CM | POA: Diagnosis present

## 2018-10-29 DIAGNOSIS — I2510 Atherosclerotic heart disease of native coronary artery without angina pectoris: Secondary | ICD-10-CM

## 2018-10-29 HISTORY — PX: LOOP RECORDER INSERTION: EP1214

## 2018-10-29 LAB — GLUCOSE, CAPILLARY
Glucose-Capillary: 165 mg/dL — ABNORMAL HIGH (ref 70–99)
Glucose-Capillary: 165 mg/dL — ABNORMAL HIGH (ref 70–99)
Glucose-Capillary: 178 mg/dL — ABNORMAL HIGH (ref 70–99)
Glucose-Capillary: 133 mg/dL — ABNORMAL HIGH (ref 70–99)
Glucose-Capillary: 133 mg/dL — ABNORMAL HIGH (ref 70–99)

## 2018-10-29 SURGERY — LOOP RECORDER INSERTION

## 2018-10-29 MED ORDER — LIDOCAINE-EPINEPHRINE 1 %-1:100000 IJ SOLN
INTRAMUSCULAR | Status: AC
  Filled 2018-10-29: qty 1

## 2018-10-29 MED ORDER — LIDOCAINE-EPINEPHRINE 1 %-1:100000 IJ SOLN
INTRAMUSCULAR | Status: DC | PRN
  Administered 2018-10-29: 30 mL

## 2018-10-29 MED ORDER — LEVOTHYROXINE SODIUM 25 MCG PO TABS
137.0000 ug | ORAL_TABLET | Freq: Every day | ORAL | Status: DC
  Administered 2018-10-30: 137 ug via ORAL
  Filled 2018-10-29: qty 1

## 2018-10-29 SURGICAL SUPPLY — 2 items
LOOP REVEAL LINQSYS (Prosthesis & Implant Heart) ×3 IMPLANT
PACK LOOP INSERTION (CUSTOM PROCEDURE TRAY) ×3 IMPLANT

## 2018-10-29 NOTE — Care Management Important Message (Signed)
Important Message  Patient Details  Name: Tonya Hunter MRN: [id number] Date of Birth: [birth year]   Medicare Important Message Given:  Yes    Orbie Pyo 10/29/2018, 2:14 PM
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
## 2018-10-29 NOTE — Progress Notes (Signed)
Physical Therapy Treatment Patient Details Name: Tonya Hunter MRN: [id number] DOB: [birth year] Today's Date: 10/29/2018    History of Present Illness Pt is a 84 y.o. F with significant PMH of CAD, diabetes type II, MI, knee replacement, and TIA. Now presenting with right sided weakness, treated with tPA. MRI reveals "Acute/subacute nonhemorrhagic infarct of the posterior left MCA division involving the posterior left insular cortex, sylvian    PT Comments    Pt reluctant to get OOB with PT. She wanted to stay in hospital bed until time to go home.  She did agree to walk with encouragement. She did well walking with RW - walked 120 feet in hall with cues for posture. She did have some mild dizziness when up - educated on safety with this.  Pt feels she will be ready to go home tomorrow.  She will have  HHPT to continue to work on safety with mobilty   Follow Up Recommendations  Home health PT;Supervision for mobility/OOB     Equipment Recommendations  None recommended by PT    Recommendations for Other Services       Precautions / Restrictions Precautions Precautions: Fall Precaution Comments: Pt dizzy when up - didnt get better with mobility Restrictions Weight Bearing Restrictions: No Other Position/Activity Restrictions: Pt educated on safety with dizziness - having chairs t hroughout house etc to sit quickly if needed.    Mobility  Bed Mobility Overal bed mobility: Modified Independent Bed Mobility: Supine to Sit     Supine to sit: HOB elevated;Modified independent (Device/Increase time)        Transfers Overall transfer level: Needs assistance Equipment used: Rolling walker (2 wheeled) Transfers: Sit to/from Stand Sit to Stand: Supervision         General transfer comment: supervision for safety and balance, cueing for hand placement and technique.  Pt educated on dizziness - not moving too quickly  Ambulation/Gait Ambulation/Gait assistance: Min guard Gait  Distance (Feet): 120 Feet Assistive device: Rolling walker (2 wheeled) Gait Pattern/deviations: Step-through pattern;Trunk flexed;Decreased stride length     General Gait Details: pt cued to stand tall.  pt complained of mild dizziness but didnt feel unsafe.  she knows to have someone with her when   up and to sit quickly in chair if dizziness increases.   Stairs             Wheelchair Mobility    Modified Rankin (Stroke Patients Only) Modified Rankin (Stroke Patients Only) Pre-Morbid Rankin Score: No significant disability Modified Rankin: Moderately severe disability     Balance                                            Cognition Arousal/Alertness: Awake/alert Behavior During Therapy: WFL for tasks assessed/performed;Anxious Overall Cognitive Status: Within Functional Limits for tasks assessed                                        Exercises      General Comments General comments (skin integrity, edema, etc.): pt talked about her balance being off before this - she was actively getting OPPT for her balance before admitted      Pertinent Vitals/Pain Pain Assessment: No/denies pain    Home Living  Prior Function            PT Goals (current goals can now be found in the care plan section) Progress towards PT goals: Progressing toward goals    Frequency    Min 4X/week      PT Plan Current plan remains appropriate    Co-evaluation              AM-PAC PT "6 Clicks" Mobility   Outcome Measure  Help needed turning from your back to your side while in a flat bed without using bedrails?: None Help needed moving from lying on your back to sitting on the side of a flat bed without using bedrails?: A Little Help needed moving to and from a bed to a chair (including a wheelchair)?: A Little Help needed standing up from a chair using your arms (e.g., wheelchair or bedside chair)?: A  Little Help needed to walk in hospital room?: A Little Help needed climbing 3-5 steps with a railing? : A Lot 6 Click Score: 18    End of Session Equipment Utilized During Treatment: Gait belt Activity Tolerance: Patient tolerated treatment well Patient left: in chair;with chair alarm set;with call bell/phone within reach Nurse Communication: Mobility status PT Visit Diagnosis: Unsteadiness on feet (R26.81);Muscle weakness (generalized) (M62.81);Difficulty in walking, not elsewhere classified (R26.2);Other symptoms and signs involving the nervous system (R29.898)     Time: 1110-1130 PT Time Calculation (min) (ACUTE ONLY): 20 min  Charges:  $Gait Training: 8-22 mins                     10/29/2018   Rande Lawman, PT    Tonya Hunter 10/29/2018, 11:46 AM

## 2018-10-29 NOTE — Progress Notes (Addendum)
Occupational Therapy Treatment Patient Details Name: Tonya Hunter MRN: [id number] DOB: 1935/04/16 Today's Date: 10/29/2018    History of present illness Pt is a 84 y.o. F with significant PMH of CAD, diabetes type II, MI, knee replacement, and TIA. Now presenting with right sided weakness, treated with tPA. 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 Administered Pill Box Test for further assessment of cognition during an IADLs; pt reporting she performs IADLs at home including medication management, cooking, cleaning, and money management/bill paying. Pt failed the assessment, demonstrating poor planning, short-term memory, mental flexibility, suboptimal search strategies, concrete thinking and the inability to multitask. Pt had a total of 42 errors, where more than 3 errors is considered a fail.   Errors: One tablet 3x/day (Yellow) - 14 errors (misplacement) One tablet 2x/day with breakfast and dinner (Green) - 14 errors (omissions) One tablet in the morning (Blue) - 7 errors (additions) One tablet daily at bedtime [facility]) - 7 errors (misplacement) One tablet every other day (Red) - 0 errors    Number of total errors (sum of omissions; misplacements; additions) - 42 Total time to complete task (allowed 5 min) - 23 min  During assessment, pt reporting she is finished but had not organized pills from "2x/day with breakfast and dinner". Pt required Max cues to recognize errors and identify which pill bottle she had forgotten to organize in the pill box. Pt also presenting with poor awareness and error recognition during review of assessment requiring Max cues to identify errors. Continue to recommend dc to home with HHOT and will continue to follow acutely as admitted.     Follow Up Recommendations  Home health  OT;Supervision/Assistance - 24 hour    Equipment Recommendations  3 in 1 bedside commode    Recommendations for Other Services      Precautions / Restrictions Precautions Precautions: Fall Restrictions Weight Bearing Restrictions: No       Mobility Bed Mobility               General bed mobility comments: In recliner upon arrival  Transfers                      Balance Overall balance assessment: Needs assistance Sitting-balance support: No upper extremity supported;Feet supported Sitting balance-Leahy Scale: Fair                                     ADL either performed or assessed with clinical judgement   ADL Overall ADL's : Needs assistance/impaired                                       General ADL Comments: Pt declining any ADLs or functional mobility. Focused session on cognition and performance of IADLs as pt reports she performs medication management, money management, and cooking at home. Administered Pill Box assessment     Vision Baseline Vision/History: Wears glasses Wears Glasses: Reading only Patient Visual Report: No change from baseline Vision Assessment?: No apparent visual deficits   Perception     Praxis      Cognition Arousal/Alertness: Awake/alert Behavior During Therapy: WFL for tasks assessed/performed;Anxious Overall Cognitive Status: Impaired/Different from baseline Area  of Impairment: Attention;Memory;Following commands;Safety/judgement;Awareness;Problem solving;Orientation                 Orientation Level: Disoriented to;Time(year) Current Attention Level: Sustained Memory: Decreased recall of precautions;Decreased short-term memory Following Commands: Follows one step commands with increased time;Follows multi-step commands inconsistently;Follows one step commands inconsistently Safety/Judgement: Decreased awareness of safety;Decreased awareness of deficits Awareness:  Emergent Problem Solving: Slow processing;Decreased initiation;Difficulty sequencing;Requires verbal cues;Requires tactile cues General Comments: Assessed using the Pill Box Test. Pt failed the assessment, demonstrating poor planning, mental flexibility, suboptimal search strategies, concrete thinking and the inability to multitask. Pt had a total of42 errors, where more than 3 errors is considered a fail. During assessment, pt reporting she is finished but had not organized pills from "2x/day with breakfast and dinner". Pt required Max cues to recognize errors and identify which pill bottle she had forgotten to organize in the pill box. Pt also presenting with poor awareness and error recognition during review of assessment. Pt repeating that she "does not use pill box at home, would not have any issues at home, and does not want her husband to assist her at home".  However, feel pt will require assistance with IADLs including medication management, money management, and cooking.         Exercises     Shoulder Instructions       General Comments      Pertinent Vitals/ Pain       Pain Assessment: No/denies pain  Home Living Family/patient expects to be discharged to:: Private residence Living Arrangements: Spouse/significant other Available Help at Discharge: Family;Available 24 hours/day Type of Home: House Home Access: Stairs to enter [employer] of Steps: 3 Entrance Stairs-Rails: Left Home Layout: One level     Bathroom Shower/Tub: Occupational psychologist: Standard     Home Equipment: Environmental consultant - 2 wheels;Kasandra Knudsen - single point      Lives With: Spouse    Prior Functioning/Environment Level of Independence: Independent with assistive device(s)        Comments: has been using walker since fall 3 weeks ago, independent with ADLs, driving and managing meds   Frequency  Min 2X/week        Progress Toward Goals  OT Goals(current goals can now be found  in the care plan section)  Progress towards OT goals: Progressing toward goals  Acute Rehab OT Goals Patient Stated Goal: "get back to gardening." OT Goal Formulation: With patient Time For Goal Achievement: 11/11/18 Potential to Achieve Goals: Good ADL Goals Pt Will Perform Grooming: with modified independence;standing Pt Will Perform Lower Body Dressing: sit to/from stand;with supervision Pt Will Transfer to Toilet: with modified independence;ambulating Pt Will Perform Tub/Shower Transfer: Shower transfer;3 in 1;ambulating;with supervision Additional ADL Goal #1: Pt will recall and complete 3 step trail making task with no more than minimal verbal cues.  Plan Discharge plan remains appropriate    Co-evaluation                 AM-PAC OT "6 Clicks" Daily Activity     Outcome Measure   Help from another person eating meals?: None Help from another person taking care of personal grooming?: None(seated) Help from another person toileting, which includes using toliet, bedpan, or urinal?: A Little Help from another person bathing (including washing, rinsing, drying)?: A Little Help from another person to put on and taking off regular upper body clothing?: A Little Help from another person to put on and taking off regular lower body  clothing?: A Lot 6 Click Score: 19    End of Session Equipment Utilized During Treatment: Rolling walker  OT Visit Diagnosis: Other abnormalities of gait and mobility (R26.89);History of falling (Z91.81);Other symptoms and signs involving cognitive function;Cognitive communication deficit (R41.841) Symptoms and signs involving cognitive functions: Cerebral infarction   Activity Tolerance Patient tolerated treatment well   Patient Left in bed;with call bell/phone within reach;with bed alarm set   Nurse Communication Mobility status        Time: 8550-1586 OT Time Calculation (min): 55 min  Charges: OT General Charges $OT Visit: 1 Visit OT  Treatments $Self Care/Home Management : 23-37 mins $Cognitive Funtion inital: Initial 15 mins $Cognitive Funtion additional: Additional15 mins  [place], OTR/L Acute Rehab Pager: [phone] Office: [place] 10/29/2018, 5:21 PM

## 2018-10-29 NOTE — TOC Initial Note (Signed)
Transition of Care [facility]) - Initial/Assessment Note    Patient Details  Name: Tonya Hunter MRN: [id number] Date of Birth: Apr 19, 1935  [facility]) CM/SW Contact:    Geralynn Ochs, LCSW Phone Number: 10/29/2018, 12:50 PM  Clinical Narrative:   Patient to be discharging home after completion of medical workup. CSW provided CMS choice for home health providers, and patient chose [facility] Patient said she used them before after a knee surgery ten years ago. Patient says she already has a walker at home, and she has a 3N1 from her previous knee surgery but she did not use it, so she does not want to order another one. Patient's husband is at home with her for 24/7 supervision.                 Expected Discharge Plan: [place] Barriers to Discharge: Continued Medical Work up   Patient Goals and CMS Choice Patient states their goals for this hospitalization and ongoing recovery are:: get back to gardening CMS Medicare.gov Compare Post Acute Care list provided to:: Patient Choice offered to / list presented to : Patient  Expected Discharge Plan and Services Expected Discharge Plan: [place] Choice: [place] arrangements for the past 2 months: Single Family Home                           HH Arranged: PT, OT, Speech Therapy [place]: [facility] Date [place]: 10/29/18 Time [place]: 1250 Representative spoke with at [place]: [place] Arrangements/Services Living arrangements for the past 2 months: [place] with:: Self, Spouse Patient language and need for interpreter reviewed:: No Do you feel safe going back to the place where you live?: Yes      Need for Family Participation in Patient Care: No (Comment) Care giver support system in place?: Yes (comment) Current home services: Home OT, Home  PT Criminal Activity/Legal Involvement Pertinent to Current Situation/Hospitalization: No - Comment as needed  Activities of Daily Living      Permission Sought/Granted Permission sought to share information with : Chartered certified accountant granted to share information with : Yes, Verbal Permission Granted     Permission granted to share info w AGENCY: [facility]        Emotional Assessment Appearance:: Appears stated age Attitude/Demeanor/Rapport: Engaged Affect (typically observed): Pleasant Orientation: : Oriented to Self, Oriented to Place, Oriented to  Time, Oriented to Situation Alcohol / Substance Use: Not Applicable Psych Involvement: No (comment)  Admission diagnosis:  CODE STROKE  Patient Active Problem List   Diagnosis Date Noted  . Stroke (cerebrum) ([place]) 10/26/2018  . Stroke-like symptoms 03/07/2017  . Left shoulder pain 03/07/2017  . Slurred speech   . Chest pain 09/15/2016  . Anemia 06/14/2016  . Hyperkalemia 06/14/2016  . Diabetes mellitus with complication ([place])   . Upper gastrointestinal bleed 06/16/2014  . Hematemesis 06/16/2014  . NECK PAIN 10/28/2009  . OSTEOARTHRITIS 08/26/2008  . Hypothyroidism 08/26/2008  . IDDM (insulin dependent diabetes mellitus) ([place]) 01/03/2007  . Elevated lipids 01/03/2007  . Essential hypertension 01/03/2007  . Coronary atherosclerosis 01/03/2007  . CAD (coronary artery disease) 09/13/2005   PCP:  [name], MD Pharmacy:   [facility] [phone] - [place], [place] AT [facility]  OF [address] [place] [address] Phone: [phone] Fax: [phone]     Social Determinants of Health (SDOH) Interventions    Readmission Risk Interventions No flowsheet data found.

## 2018-10-29 NOTE — Progress Notes (Signed)
STROKE TEAM PROGRESS NOTE   SUBJECTIVE (INTERVAL HISTORY) Pt sitting in chair. No complains. EP is planning for loop recorder later today.   OBJECTIVE Vitals:   10/29/18 0320 10/29/18 0747 10/29/18 1206 10/29/18 1309  BP: 137/67 107/65 (!) 86/64 (!) 111/46  Pulse: 84 91 72 79  Resp: 16 17 20    Temp: 98.8 F (37.1 C) 97.9 F (36.6 C) 97.7 F (36.5 C)   TempSrc: Oral Oral Oral   SpO2: 98% 99% 99%   Weight:      Height:        CBC:  Recent Labs  Lab 10/26/18 1716 10/26/18 1725 10/27/18 0548  WBC 5.6  --  5.8  NEUTROABS 3.4  --   --   HGB 12.4 13.3 11.5*  HCT 37.9 39.0 34.0*  MCV 91.8  --  89.2  PLT 159  --  141*    Basic Metabolic Panel:  Recent Labs  Lab 10/26/18 1716 10/26/18 1725  NA 137 136  K 4.3 4.2  CL 104 104  CO2 17*  --   GLUCOSE 182* 177*  BUN 20 19  CREATININE 1.16* 1.00  CALCIUM 9.3  --     Lipid Panel:     Component Value Date/Time   CHOL 159 10/27/2018 0548   TRIG 102 10/27/2018 0548   HDL 46 10/27/2018 0548   CHOLHDL 3.5 10/27/2018 0548   VLDL 20 10/27/2018 0548   LDLCALC 93 10/27/2018 0548   HgbA1c:  Lab Results  Component Value Date   HGBA1C 7.6 (H) 10/27/2018   Urine Drug Screen:     Component Value Date/Time   LABOPIA NONE DETECTED 06/09/2016 1146   COCAINSCRNUR NONE DETECTED 06/09/2016 1146   LABBENZ NONE DETECTED 06/09/2016 1146   AMPHETMU NONE DETECTED 06/09/2016 1146   THCU NONE DETECTED 06/09/2016 1146   LABBARB NONE DETECTED 06/09/2016 1146    Alcohol Level     Component Value Date/Time   ETH <5 06/09/2016 1228    IMAGING Ct Head Code Stroke Wo Contrast 10/26/2018 IMPRESSION:  1. No intracranial hemorrhage or mass effect.  2. ASPECTS is 10.   Ct Angio Head W Or Wo Contrast Ct Angio Neck W Or Wo Contrast 10/26/2018 IMPRESSION:  No emergent large vessel occlusion or hemodynamically significant stenosis by NASCET criteria.  Transthoracic Echocardiogram  09/21/2018 IMPRESSIONS  1. Severe akinesis of the  left ventricular, entire anteroseptal wall.  2. The left ventricle has mild-moderately reduced systolic function, with an ejection fraction of 40-45%. The cavity size was normal. There is moderately increased left ventricular wall thickness. Findings are consistent with ischemic cardiomyopathy.  Left ventricular diastolic Doppler parameters are consistent with impaired relaxation.  3. The right ventricle has normal systolic function. The cavity was normal. There is no increase in right ventricular wall thickness.  4. Left atrial size was mildly dilated.  5. The mitral valve is degenerative. Moderate thickening of the mitral valve leaflet. Moderate calcification of the mitral valve leaflet. No evidence of mitral valve stenosis.  6. The aortic valve is tricuspid. Moderate thickening of the aortic valve. Moderate calcification of the aortic valve. Mild-moderate stenosis of the aortic valve.  7. Aortic stenosis is unchanged from prior.  8. The aortic root is normal in size and structure.  LE venous doppler  No DVT  EKG - SR rate 81BPM. RBBB. (See cardiology reading for complete details)   PHYSICAL EXAM  Temp:  [97.7 F (36.5 C)-98.8 F (37.1 C)] 97.7 F (36.5 C) (06/15 1206) Pulse  Rate:  [72-91] 79 (06/15 1309) Resp:  [16-20] 20 (06/15 1206) BP: (86-147)/(46-80) 111/46 (06/15 1309) SpO2:  [94 %-99 %] 99 % (06/15 1206)  General - Well nourished, well developed, in no apparent distress.  Ophthalmologic - fundi not visualized due to noncooperation.  Cardiovascular - Regular rate and rhythm.  Mental Status -  Level of arousal and orientation to place, age, time and person were intact. Language including expression, naming, repetition, comprehension was assessed and found intact.   Cranial Nerves II - XII - II - Visual field intact OU. III, IV, VI - Extraocular movements intact. V - Facial sensation intact bilaterally. VII - Facial movement intact bilaterally. VIII - Hearing &  vestibular intact bilaterally. X - Palate elevates symmetrically. XI - Chin turning & shoulder shrug intact bilaterally. XII - Tongue protrusion intact.  Motor Strength - The patient's strength was normal in all extremities and pronator drift was absent.  Bulk was normal and fasciculations were absent   Motor Tone - Muscle tone was assessed at the neck and appendages and was normal.  Reflexes - The patient's reflexes were symmetrical in all extremities and she had no pathological reflexes.  Sensory - Light touch, temperature/pinprick were assessed and were symmetrical.    Coordination - The patient had normal movements in the hands with no ataxia or dysmetria.  Tremor was absent.  Gait and Station - deferred.   ASSESSMENT/PLAN [name] is a 84 y.o. female with history of DM, CAD with MI and stent, fell and hit her head about 3 weeks ago, esophogeal varices, Htn, Hld, TIA hx and aortic stenosis presenting with right sided weakness. tpa given Friday 10/26/2018 at 1745.  Stroke - left MCA infarcts s/p tPA, embolic pattern, concerning for cardioembolic source  CT head - No intracranial hemorrhage or mass effect.   MRI head -  Left MCA patchy multifocal infarcts  CTA H&N - unremarkable  2D Echo - 09/21/2018 - EF 40 - 45% (ischemic CM)  LE venous doppler - neg for DVT  loop recorder will be placed today to rule out A. fib  LDL - 93  HgbA1c - 7.6  VTE prophylaxis - SCDs  aspirin 325 mg daily and clopidogrel 75 mg daily prior to admission, now on back on plavix and ASA 81. Continue DAPT on discharge.   Therapy recommendations: HH PT/OT/SLP  Disposition:  For d/c home tomorrow after loop placement today  Heart palpitation Hx of CAD s/p stenting  CAD s/p DES to LAD (2007) s/p DES for ISR of LAD (2011)  Followed with Dr. Johnsie Cancel  Palpitations s/p event monitor in 2018 showed PACs - put on beta-blocker  Loop recorder planned today to rule out A. fib  On low-dose  lisinopril  Entresto intolerance  Postconcussion syndrome  08/2018 patient had fall with headache, forehead numbness and rib fracture  Has been following with Dr. Leta Baptist at [facility] seen on 10/04/2018  Continue follow-up with Dr. Leta Baptist  Hypertension  Stable . Long-term BP goal normotensive  Hyperlipidemia  Lipid lowering medication PTA:  Zocor 20 mg daily  LDL 93, goal < 70  Current lipid lowering medication: increase Zocor to 40 mg daily  Continue statin at discharge  Diabetes  HgbA1c 7.6, goal < 7.0  Uncontrolled   SSI   CBG monitoring  Other Stroke Risk Factors  Advanced age  Obesity, Body mass index is 35.38 kg/m., recommend weight loss, diet and exercise as appropriate   Hx of TIA  Coronary artery disease/MI  Other Active Problems  GI bleed 2018 with banding grade 2 varices on 07/07/16 by Dr. Amedeo Plenty.  Hospital day # 3  Rosalin Hawking, MD PhD Stroke Neurology 10/29/2018 3:35 PM  To contact Stroke Continuity provider, please refer to [email or web address] After hours, contact General Neurology

## 2018-10-29 NOTE — Discharge Instructions (Signed)
Implant site/wound care instructions Keep incision clean and dry for 3 days. You can remove outer dressing tomorrow. Leave steri-strips (little pieces of tape) on until seen in the office for wound check appointment. Call the office [phone]) for redness, drainage, swelling, or fever.

## 2018-10-30 ENCOUNTER — Encounter (HOSPITAL_COMMUNITY): Payer: Self-pay | Admitting: Internal Medicine

## 2018-10-30 LAB — GLUCOSE, CAPILLARY
Glucose-Capillary: 173 mg/dL — ABNORMAL HIGH (ref 70–99)
Glucose-Capillary: 189 mg/dL — ABNORMAL HIGH (ref 70–99)

## 2018-10-30 MED ORDER — ASPIRIN 81 MG PO TBEC: 81.0000 mg | DELAYED_RELEASE_TABLET | Freq: Every day | ORAL | Status: DC

## 2018-10-30 MED ORDER — LISINOPRIL 2.5 MG PO TABS: 2.5000 mg | ORAL_TABLET | Freq: Every day | ORAL | Status: DC

## 2018-10-30 MED ORDER — METOPROLOL SUCCINATE ER 50 MG PO TB24: 50.0000 mg | ORAL_TABLET | Freq: Every day | ORAL | 2 refills | Status: DC

## 2018-10-30 MED ORDER — SIMVASTATIN 40 MG PO TABS: 40.0000 mg | ORAL_TABLET | Freq: Every day | ORAL | 2 refills | Status: DC

## 2018-10-30 NOTE — Progress Notes (Signed)
PT Cancellation Note  Patient Details Name: Tonya Hunter MRN: [id number] DOB: Oct 05, 1934   Cancelled Treatment:    Reason Eval/Treat Not Completed: Patient declined, no reason specified. Pt refused to work with PT today, stating "I'm going home soon and I just don't feel like it." Max encouragement provided however pt continued to decline.    Thelma Comp 10/30/2018, 2:37 PM   Rolinda Roan, PT, DPT Acute Rehabilitation Services Pager: [phone] Office: [phone]

## 2018-10-30 NOTE — Progress Notes (Signed)
Patient is discharging home with home health. Husband called for transport. All discharge paperwork went over with patient. All questions and concerns addressed. All belongings sent with patient. IV taken out. Patient taken down in wheelchair. [place]

## 2018-10-30 NOTE — TOC Transition Note (Signed)
Transition of Care [facility]) - CM/SW Discharge Note   Patient Details  Name: RYELEE ALBEE MRN: [id number] Date of Birth: 14-Nov-1934  [facility]) CM/SW Contact:  Pollie Friar, RN Phone Number: 10/30/2018, 2:50 PM   Clinical Narrative:    Informed Tiffany with [facility] of pts discharge. Pt has PCP and transportation home.    Final next level of care: [place] Barriers to Discharge: No Barriers Identified   Patient Goals and CMS Choice Patient states their goals for this hospitalization and ongoing recovery are:: get back to gardening CMS Medicare.gov Compare Post Acute Care list provided to:: Patient Choice offered to / list presented to : Patient  Discharge Placement                       Discharge Plan and Services     Post Acute Care Choice: Home Health                    HH Arranged: PT, OT, Speech Therapy [place]: [facility] Date [place]: 10/29/18 Time [place]: 1250 Representative spoke with at [place]: [place] ([place]) Interventions     Readmission Risk Interventions No flowsheet data found.

## 2018-11-01 DIAGNOSIS — I10 Essential (primary) hypertension: Secondary | ICD-10-CM | POA: Diagnosis not present

## 2018-11-01 DIAGNOSIS — E038 Other specified hypothyroidism: Secondary | ICD-10-CM | POA: Diagnosis not present

## 2018-11-01 DIAGNOSIS — E1159 Type 2 diabetes mellitus with other circulatory complications: Secondary | ICD-10-CM | POA: Diagnosis not present

## 2018-11-01 DIAGNOSIS — E782 Mixed hyperlipidemia: Secondary | ICD-10-CM | POA: Diagnosis not present

## 2018-11-02 ENCOUNTER — Telehealth: Payer: Self-pay | Admitting: Cardiovascular Disease

## 2018-11-02 DIAGNOSIS — I639 Cerebral infarction, unspecified: Secondary | ICD-10-CM | POA: Diagnosis not present

## 2018-11-02 NOTE — Telephone Encounter (Signed)
New message  Patient has questions about her device and if the transmissions are coming over. Please call.

## 2018-11-02 NOTE — Telephone Encounter (Signed)
Spoke w/ pt and informed her that her home monitor is working. Informed her that if her monitor does not work we will call her and have her send a manual transmission w/ her home monitor. Pt verbalized understanding.

## 2018-11-02 NOTE — Telephone Encounter (Signed)
LMOVM for pt to call DC back.

## 2018-11-04 ENCOUNTER — Emergency Department (HOSPITAL_COMMUNITY): Payer: Medicare Other

## 2018-11-04 ENCOUNTER — Inpatient Hospital Stay (HOSPITAL_COMMUNITY)
Admission: EM | Admit: 2018-11-04 | Discharge: 2018-11-07 | DRG: 638 | Disposition: A | Discharge status: Home Health | Payer: Medicare Other | Attending: Internal Medicine | Admitting: Internal Medicine

## 2018-11-04 ENCOUNTER — Encounter (HOSPITAL_COMMUNITY): Payer: Self-pay | Admitting: *Deleted

## 2018-11-04 ENCOUNTER — Other Ambulatory Visit: Payer: Self-pay

## 2018-11-04 DIAGNOSIS — E039 Hypothyroidism, unspecified: Secondary | ICD-10-CM | POA: Diagnosis present

## 2018-11-04 DIAGNOSIS — R4182 Altered mental status, unspecified: Secondary | ICD-10-CM

## 2018-11-04 DIAGNOSIS — Z8673 Personal history of transient ischemic attack (TIA), and cerebral infarction without residual deficits: Secondary | ICD-10-CM | POA: Diagnosis not present

## 2018-11-04 DIAGNOSIS — Z90710 Acquired absence of both cervix and uterus: Secondary | ICD-10-CM | POA: Diagnosis not present

## 2018-11-04 DIAGNOSIS — I252 Old myocardial infarction: Secondary | ICD-10-CM | POA: Diagnosis not present

## 2018-11-04 DIAGNOSIS — N390 Urinary tract infection, site not specified: Secondary | ICD-10-CM | POA: Diagnosis not present

## 2018-11-04 DIAGNOSIS — I2510 Atherosclerotic heart disease of native coronary artery without angina pectoris: Secondary | ICD-10-CM | POA: Diagnosis present

## 2018-11-04 DIAGNOSIS — Z85828 Personal history of other malignant neoplasm of skin: Secondary | ICD-10-CM | POA: Diagnosis not present

## 2018-11-04 DIAGNOSIS — Z79890 Hormone replacement therapy (postmenopausal): Secondary | ICD-10-CM | POA: Diagnosis not present

## 2018-11-04 DIAGNOSIS — Z880 Allergy status to penicillin: Secondary | ICD-10-CM

## 2018-11-04 DIAGNOSIS — R42 Dizziness and giddiness: Secondary | ICD-10-CM | POA: Diagnosis not present

## 2018-11-04 DIAGNOSIS — Z8249 Family history of ischemic heart disease and other diseases of the circulatory system: Secondary | ICD-10-CM

## 2018-11-04 DIAGNOSIS — Z79899 Other long term (current) drug therapy: Secondary | ICD-10-CM

## 2018-11-04 DIAGNOSIS — T383X5A Adverse effect of insulin and oral hypoglycemic [antidiabetic] drugs, initial encounter: Secondary | ICD-10-CM | POA: Diagnosis not present

## 2018-11-04 DIAGNOSIS — F0781 Postconcussional syndrome: Secondary | ICD-10-CM | POA: Diagnosis present

## 2018-11-04 DIAGNOSIS — E119 Type 2 diabetes mellitus without complications: Secondary | ICD-10-CM | POA: Diagnosis not present

## 2018-11-04 DIAGNOSIS — I4510 Unspecified right bundle-branch block: Secondary | ICD-10-CM | POA: Diagnosis not present

## 2018-11-04 DIAGNOSIS — N3000 Acute cystitis without hematuria: Secondary | ICD-10-CM | POA: Diagnosis not present

## 2018-11-04 DIAGNOSIS — E162 Hypoglycemia, unspecified: Secondary | ICD-10-CM

## 2018-11-04 DIAGNOSIS — E871 Hypo-osmolality and hyponatremia: Secondary | ICD-10-CM | POA: Diagnosis present

## 2018-11-04 DIAGNOSIS — E118 Type 2 diabetes mellitus with unspecified complications: Secondary | ICD-10-CM | POA: Diagnosis not present

## 2018-11-04 DIAGNOSIS — F0390 Unspecified dementia without behavioral disturbance: Secondary | ICD-10-CM | POA: Diagnosis present

## 2018-11-04 DIAGNOSIS — Z955 Presence of coronary angioplasty implant and graft: Secondary | ICD-10-CM | POA: Diagnosis not present

## 2018-11-04 DIAGNOSIS — I10 Essential (primary) hypertension: Secondary | ICD-10-CM | POA: Diagnosis not present

## 2018-11-04 DIAGNOSIS — K219 Gastro-esophageal reflux disease without esophagitis: Secondary | ICD-10-CM | POA: Diagnosis present

## 2018-11-04 DIAGNOSIS — Z7982 Long term (current) use of aspirin: Secondary | ICD-10-CM | POA: Diagnosis not present

## 2018-11-04 DIAGNOSIS — E160 Drug-induced hypoglycemia without coma: Secondary | ICD-10-CM | POA: Diagnosis not present

## 2018-11-04 DIAGNOSIS — E876 Hypokalemia: Secondary | ICD-10-CM | POA: Diagnosis present

## 2018-11-04 DIAGNOSIS — E161 Other hypoglycemia: Secondary | ICD-10-CM | POA: Diagnosis not present

## 2018-11-04 DIAGNOSIS — R29810 Facial weakness: Secondary | ICD-10-CM | POA: Diagnosis not present

## 2018-11-04 DIAGNOSIS — Z96653 Presence of artificial knee joint, bilateral: Secondary | ICD-10-CM | POA: Diagnosis present

## 2018-11-04 DIAGNOSIS — Z794 Long term (current) use of insulin: Secondary | ICD-10-CM

## 2018-11-04 DIAGNOSIS — E11649 Type 2 diabetes mellitus with hypoglycemia without coma: Principal | ICD-10-CM | POA: Diagnosis present

## 2018-11-04 DIAGNOSIS — E785 Hyperlipidemia, unspecified: Secondary | ICD-10-CM | POA: Diagnosis present

## 2018-11-04 DIAGNOSIS — Z1159 Encounter for screening for other viral diseases: Secondary | ICD-10-CM

## 2018-11-04 DIAGNOSIS — R51 Headache: Secondary | ICD-10-CM | POA: Diagnosis not present

## 2018-11-04 DIAGNOSIS — Z7902 Long term (current) use of antithrombotics/antiplatelets: Secondary | ICD-10-CM

## 2018-11-04 DIAGNOSIS — F419 Anxiety disorder, unspecified: Secondary | ICD-10-CM | POA: Diagnosis present

## 2018-11-04 DIAGNOSIS — T502X5A Adverse effect of carbonic-anhydrase inhibitors, benzothiadiazides and other diuretics, initial encounter: Secondary | ICD-10-CM | POA: Diagnosis present

## 2018-11-04 DIAGNOSIS — Z9181 History of falling: Secondary | ICD-10-CM

## 2018-11-04 LAB — CBC WITH DIFFERENTIAL/PLATELET
Abs Immature Granulocytes: 0.01 10*3/uL (ref 0.00–0.07)
Basophils Absolute: 0 10*3/uL (ref 0.0–0.1)
Basophils Relative: 0 %
Eosinophils Absolute: 0.1 10*3/uL (ref 0.0–0.5)
Eosinophils Relative: 2 %
HCT: 34.2 % — ABNORMAL LOW (ref 36.0–46.0)
Hemoglobin: 11.5 g/dL — ABNORMAL LOW (ref 12.0–15.0)
Immature Granulocytes: 0 %
Lymphocytes Relative: 25 %
Lymphs Abs: 1.6 10*3/uL (ref 0.7–4.0)
MCH: 30.4 pg (ref 26.0–34.0)
MCHC: 33.6 g/dL (ref 30.0–36.0)
MCV: 90.5 fL (ref 80.0–100.0)
Monocytes Absolute: 0.7 10*3/uL (ref 0.1–1.0)
Monocytes Relative: 11 %
Neutro Abs: 4 10*3/uL (ref 1.7–7.7)
Neutrophils Relative %: 62 %
Platelets: 187 10*3/uL (ref 150–400)
RBC: 3.78 MIL/uL — ABNORMAL LOW (ref 3.87–5.11)
RDW: 14 % (ref 11.5–15.5)
WBC: 6.4 10*3/uL (ref 4.0–10.5)
nRBC: 0 % (ref 0.0–0.2)

## 2018-11-04 LAB — COMPREHENSIVE METABOLIC PANEL
ALT: 19 U/L (ref 0–44)
AST: 32 U/L (ref 15–41)
Albumin: 3.4 g/dL — ABNORMAL LOW (ref 3.5–5.0)
Alkaline Phosphatase: 139 U/L — ABNORMAL HIGH (ref 38–126)
Anion gap: 11 (ref 5–15)
BUN: 18 mg/dL (ref 8–23)
CO2: 22 mmol/L (ref 22–32)
Calcium: 9 mg/dL (ref 8.9–10.3)
Chloride: 101 mmol/L (ref 98–111)
Creatinine, Ser: 1.08 mg/dL — ABNORMAL HIGH (ref 0.44–1.00)
GFR calc Af Amer: 55 mL/min — ABNORMAL LOW (ref >60)
GFR calc non Af Amer: 48 mL/min — ABNORMAL LOW (ref >60)
Glucose, Bld: 52 mg/dL — ABNORMAL LOW (ref 70–99)
Potassium: 3.4 mmol/L — ABNORMAL LOW (ref 3.5–5.1)
Sodium: 134 mmol/L — ABNORMAL LOW (ref 135–145)
Total Bilirubin: 1.2 mg/dL (ref 0.3–1.2)
Total Protein: 6.8 g/dL (ref 6.5–8.1)

## 2018-11-04 LAB — CBG MONITORING, ED
Glucose-Capillary: 109 mg/dL — ABNORMAL HIGH (ref 70–99)
Glucose-Capillary: 115 mg/dL — ABNORMAL HIGH (ref 70–99)
Glucose-Capillary: 119 mg/dL — ABNORMAL HIGH (ref 70–99)
Glucose-Capillary: 143 mg/dL — ABNORMAL HIGH (ref 70–99)
Glucose-Capillary: 148 mg/dL — ABNORMAL HIGH (ref 70–99)
Glucose-Capillary: 32 mg/dL — CL (ref 70–99)
Glucose-Capillary: 52 mg/dL — ABNORMAL LOW (ref 70–99)
Glucose-Capillary: 52 mg/dL — ABNORMAL LOW (ref 70–99)
Glucose-Capillary: 64 mg/dL — ABNORMAL LOW (ref 70–99)
Glucose-Capillary: 72 mg/dL (ref 70–99)
Glucose-Capillary: 73 mg/dL (ref 70–99)
Glucose-Capillary: 81 mg/dL (ref 70–99)

## 2018-11-04 LAB — URINALYSIS, ROUTINE W REFLEX MICROSCOPIC
Bilirubin Urine: NEGATIVE
Glucose, UA: NEGATIVE mg/dL
Hgb urine dipstick: NEGATIVE
Ketones, ur: NEGATIVE mg/dL
Nitrite: NEGATIVE
Protein, ur: NEGATIVE mg/dL
Specific Gravity, Urine: 1.004 — ABNORMAL LOW (ref 1.005–1.030)
pH: 5 (ref 5.0–8.0)

## 2018-11-04 LAB — PROTIME-INR
INR: 1.1 (ref 0.8–1.2)
Prothrombin Time: 13.8 seconds (ref 11.4–15.2)

## 2018-11-04 LAB — ETHANOL: Alcohol, Ethyl (B): <10 mg/dL (ref <10)

## 2018-11-04 MED ORDER — DEXTROSE 5 % IV SOLN
Freq: Once | INTRAVENOUS | Status: AC
  Administered 2018-11-04: 21:00:00 via INTRAVENOUS

## 2018-11-04 MED ORDER — CIPROFLOXACIN IN D5W 400 MG/200ML IV SOLN
400.0000 mg | Freq: Once | INTRAVENOUS | Status: AC
  Administered 2018-11-04: 400 mg via INTRAVENOUS
  Filled 2018-11-04: qty 200

## 2018-11-04 MED ORDER — DEXTROSE 50 % IV SOLN
1.0000 | Freq: Once | INTRAVENOUS | Status: AC
  Administered 2018-11-04: 50 mL via INTRAVENOUS

## 2018-11-04 MED ORDER — ACETAMINOPHEN 325 MG PO TABS
650.0000 mg | ORAL_TABLET | Freq: Once | ORAL | Status: AC
  Administered 2018-11-04: 650 mg via ORAL
  Filled 2018-11-04: qty 2

## 2018-11-04 MED ORDER — FOSFOMYCIN TROMETHAMINE 3 G PO PACK
3.0000 g | PACK | Freq: Once | ORAL | Status: AC
  Administered 2018-11-05: 3 g via ORAL
  Filled 2018-11-04 (×2): qty 3

## 2018-11-04 MED ORDER — DEXTROSE 50 % IV SOLN
INTRAVENOUS | Status: AC
  Filled 2018-11-04: qty 50

## 2018-11-04 MED ORDER — DEXTROSE 50 % IV SOLN
25.0000 mL | Freq: Once | INTRAVENOUS | Status: AC
  Administered 2018-11-04: 25 mL via INTRAVENOUS
  Filled 2018-11-04: qty 50
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
## 2018-11-04 NOTE — ED Triage Notes (Signed)
PT reports having a HA and feeling funny just like she did when having her stroke . Pt reports Sx's started around 1500 today.
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
## 2018-11-05 ENCOUNTER — Telehealth: Payer: Self-pay | Admitting: Internal Medicine

## 2018-11-05 ENCOUNTER — Other Ambulatory Visit: Payer: Self-pay

## 2018-11-05 ENCOUNTER — Encounter (HOSPITAL_COMMUNITY): Payer: Self-pay | Admitting: General Practice

## 2018-11-05 DIAGNOSIS — I10 Essential (primary) hypertension: Secondary | ICD-10-CM

## 2018-11-05 DIAGNOSIS — I2510 Atherosclerotic heart disease of native coronary artery without angina pectoris: Secondary | ICD-10-CM

## 2018-11-05 DIAGNOSIS — E039 Hypothyroidism, unspecified: Secondary | ICD-10-CM

## 2018-11-05 DIAGNOSIS — N390 Urinary tract infection, site not specified: Secondary | ICD-10-CM

## 2018-11-05 DIAGNOSIS — E118 Type 2 diabetes mellitus with unspecified complications: Secondary | ICD-10-CM

## 2018-11-05 HISTORY — DX: Urinary tract infection, site not specified: N39.0

## 2018-11-05 LAB — CBG MONITORING, ED
Glucose-Capillary: 100 mg/dL — ABNORMAL HIGH (ref 70–99)
Glucose-Capillary: 105 mg/dL — ABNORMAL HIGH (ref 70–99)
Glucose-Capillary: 123 mg/dL — ABNORMAL HIGH (ref 70–99)
Glucose-Capillary: 69 mg/dL — ABNORMAL LOW (ref 70–99)
Glucose-Capillary: 87 mg/dL (ref 70–99)

## 2018-11-05 LAB — COMPREHENSIVE METABOLIC PANEL
ALT: 19 U/L (ref 0–44)
AST: 30 U/L (ref 15–41)
Albumin: 3.4 g/dL — ABNORMAL LOW (ref 3.5–5.0)
Alkaline Phosphatase: 128 U/L — ABNORMAL HIGH (ref 38–126)
Anion gap: 12 (ref 5–15)
BUN: 16 mg/dL (ref 8–23)
CO2: 22 mmol/L (ref 22–32)
Calcium: 8.8 mg/dL — ABNORMAL LOW (ref 8.9–10.3)
Chloride: 98 mmol/L (ref 98–111)
Creatinine, Ser: 1.01 mg/dL — ABNORMAL HIGH (ref 0.44–1.00)
GFR calc Af Amer: >60 mL/min (ref >60)
GFR calc non Af Amer: 52 mL/min — ABNORMAL LOW (ref >60)
Glucose, Bld: 53 mg/dL — ABNORMAL LOW (ref 70–99)
Potassium: 3.3 mmol/L — ABNORMAL LOW (ref 3.5–5.1)
Sodium: 132 mmol/L — ABNORMAL LOW (ref 135–145)
Total Bilirubin: 1.3 mg/dL — ABNORMAL HIGH (ref 0.3–1.2)
Total Protein: 6.5 g/dL (ref 6.5–8.1)

## 2018-11-05 LAB — CBC
HCT: 33.3 % — ABNORMAL LOW (ref 36.0–46.0)
Hemoglobin: 11.2 g/dL — ABNORMAL LOW (ref 12.0–15.0)
MCH: 30 pg (ref 26.0–34.0)
MCHC: 33.6 g/dL (ref 30.0–36.0)
MCV: 89.3 fL (ref 80.0–100.0)
Platelets: 151 10*3/uL (ref 150–400)
RBC: 3.73 MIL/uL — ABNORMAL LOW (ref 3.87–5.11)
RDW: 13.9 % (ref 11.5–15.5)
WBC: 6.1 10*3/uL (ref 4.0–10.5)
nRBC: 0 % (ref 0.0–0.2)

## 2018-11-05 LAB — GLUCOSE, CAPILLARY
Glucose-Capillary: 163 mg/dL — ABNORMAL HIGH (ref 70–99)
Glucose-Capillary: 44 mg/dL — CL (ref 70–99)
Glucose-Capillary: 180 mg/dL — ABNORMAL HIGH (ref 70–99)
Glucose-Capillary: 137 mg/dL — ABNORMAL HIGH (ref 70–99)

## 2018-11-05 LAB — TSH: TSH: 2.941 u[IU]/mL (ref 0.350–4.500)

## 2018-11-05 MED ORDER — ENOXAPARIN SODIUM 40 MG/0.4ML ~~LOC~~ SOLN
40.0000 mg | Freq: Every day | SUBCUTANEOUS | Status: DC
  Administered 2018-11-05 – 2018-11-07 (×3): 40 mg via SUBCUTANEOUS
  Filled 2018-11-05 (×4): qty 0.4

## 2018-11-05 MED ORDER — CLOPIDOGREL BISULFATE 75 MG PO TABS
75.0000 mg | ORAL_TABLET | Freq: Every day | ORAL | Status: DC
  Administered 2018-11-05 – 2018-11-06 (×2): 75 mg via ORAL
  Filled 2018-11-05 (×2): qty 1

## 2018-11-05 MED ORDER — SIMVASTATIN 20 MG PO TABS
40.0000 mg | ORAL_TABLET | Freq: Every day | ORAL | Status: DC
  Administered 2018-11-05 – 2018-11-06 (×2): 40 mg via ORAL
  Filled 2018-11-05 (×2): qty 2

## 2018-11-05 MED ORDER — INSULIN ASPART 100 UNIT/ML ~~LOC~~ SOLN: 0.0000 [IU] | Freq: Three times a day (TID) | SUBCUTANEOUS | Status: DC

## 2018-11-05 MED ORDER — LEVOTHYROXINE SODIUM 25 MCG PO TABS
137.0000 ug | ORAL_TABLET | Freq: Every day | ORAL | Status: DC
  Administered 2018-11-06 – 2018-11-07 (×2): 137 ug via ORAL
  Filled 2018-11-05 (×2): qty 1

## 2018-11-05 MED ORDER — ONDANSETRON HCL 4 MG/2ML IJ SOLN
4.0000 mg | Freq: Four times a day (QID) | INTRAMUSCULAR | Status: DC | PRN
  Administered 2018-11-05 – 2018-11-06 (×2): 4 mg via INTRAVENOUS
  Filled 2018-11-05 (×2): qty 2

## 2018-11-05 MED ORDER — INSULIN ASPART 100 UNIT/ML ~~LOC~~ SOLN: 0.0000 [IU] | Freq: Every day | SUBCUTANEOUS | Status: DC

## 2018-11-05 MED ORDER — ASPIRIN EC 81 MG PO TBEC
81.0000 mg | DELAYED_RELEASE_TABLET | Freq: Every day | ORAL | Status: DC
  Administered 2018-11-05 – 2018-11-07 (×3): 81 mg via ORAL
  Filled 2018-11-05 (×3): qty 1

## 2018-11-05 MED ORDER — DEXTROSE 50 % IV SOLN
INTRAVENOUS | Status: AC
  Administered 2018-11-05: 16:00:00 50 mL
  Filled 2018-11-05: qty 50

## 2018-11-05 MED ORDER — LISINOPRIL 5 MG PO TABS
2.5000 mg | ORAL_TABLET | Freq: Every morning | ORAL | Status: DC
  Administered 2018-11-06 – 2018-11-07 (×2): 2.5 mg via ORAL
  Filled 2018-11-05 (×2): qty 1

## 2018-11-05 MED ORDER — DEXTROSE 5 % IV SOLN
INTRAVENOUS | Status: DC
  Administered 2018-11-05 – 2018-11-06 (×2): via INTRAVENOUS

## 2018-11-05 MED ORDER — SODIUM CHLORIDE 0.9 % IV SOLN
INTRAVENOUS | Status: DC
  Administered 2018-11-05: 08:00:00 via INTRAVENOUS

## 2018-11-05 MED ORDER — METOPROLOL SUCCINATE ER 50 MG PO TB24
50.0000 mg | ORAL_TABLET | Freq: Every day | ORAL | Status: DC
  Administered 2018-11-05 – 2018-11-06 (×2): 50 mg via ORAL
  Filled 2018-11-05 (×2): qty 1

## 2018-11-05 MED ORDER — ACETAMINOPHEN 325 MG PO TABS
650.0000 mg | ORAL_TABLET | Freq: Four times a day (QID) | ORAL | Status: DC | PRN
  Administered 2018-11-05 – 2018-11-07 (×3): 650 mg via ORAL
  Filled 2018-11-05 (×3): qty 2

## 2018-11-05 MED ORDER — ONDANSETRON HCL 4 MG PO TABS: 4.0000 mg | ORAL_TABLET | Freq: Four times a day (QID) | ORAL | Status: DC | PRN

## 2018-11-05 NOTE — ED Notes (Signed)
Family at bedside. 

## 2018-11-05 NOTE — Progress Notes (Signed)
Patient ID: Tonya Hunter, female   DOB: 1934-10-21, 84 y.o.   MRN: [id number]  PROGRESS NOTE    Tonya Hunter  SRP:[id number] DOB: 1934/09/11 DOA: 11/04/2018 PCP: [name], MD   Brief Narrative:  84 year old female with history of recent CVA with TPA given on 10/26/18 discharged on 10/30/2018, aortic stenosis, diabetes mellitus type 2, hypertension, coronary disease, GERD, hypothyroidism presented with weakness and headache on 11/04/2018.  She was found to be hypoglycemic with blood sugars in the 60s.  CT of the head showed subtle area of hypoattenuation in the left sylvian fissure as seen in the previous MRI consistent with acute to subacute ischemic stroke with no evidence of intracranial bleed.  Assessment & Plan:   Principal Problem:   Hypoglycemia due to insulin Active Problems:   IDDM (insulin dependent diabetes mellitus) (HCC)   Essential hypertension   Hypothyroidism   CAD (coronary artery disease)   Diabetes mellitus with complication (HCC)   UTI (urinary tract infection)  Hypoglycemia Weakness -Most likely the source of patient's weakness -Probably from insulin use. -Currently still intermittently hypoglycemic and still does not feel well.  She had vomiting this morning and her oral intake is poor.  Last blood sugar was 69.  We will switch her to D5 drip.  Continue monitoring blood sugars.  Insulin on hold. -PT eval.  UTI has been ruled out  -Urinalysis shows bacteria but no WBCs.  Patient is afebrile with no white count.  Patient was given a dose of ciprofloxacin and fosfomycin on presentation.  Will hold off on any further antibiotics  History of recent most likely embolic left MCA stroke status post TPA -Currently no signs of any focal neurologic deficit.  CT of the head on presentation showed subtle area of hypoattenuation in the left sylvian fissure as seen in the previous MRI consistent with acute to subacute ischemic stroke with no evidence of intracranial  bleed. -Restart aspirin and Plavix and Zocor.  Outpatient follow-up with neurology  Diabetes mellitus type 2 with hypoglycemia -Continue CBG monitoring.  Hold insulin  Hypothyroidism -Resume levothyroxine  Hypertension -Monitor blood pressure.  Continue home regimen  History of palpitations History of CAD status post stenting -Patient had recent loop recorder placement during last hospitalization. -Continue metoprolol.  Postconcussion syndrome -Patient had fall in April 2020 and follows up with neurology/Dr. Leta Hunter for the same  Hyperlipidemia -continue Zocor 40 mg daily   DVT prophylaxis: Lovenox Code Status: Full Family Communication: None at bedside Disposition Plan: Home tomorrow if clinically improving Consultants: None  Procedures: None  Antimicrobials: 1 dose of ciprofloxacin and fosfomycin on admission   Subjective: Patient seen and examined at bedside.  She does not feel well.  She feels weak.  She had vomiting this morning.  Her oral intake is poor.  No overnight fever.  No abdominal pain or diarrhea.  Objective: Vitals:   11/05/18 0500 11/05/18 0600 11/05/18 1229 11/05/18 1245  BP: (!) 144/69 (!) 157/81 131/68 133/72  Pulse:  84 77 88  Resp: 18 17 16 16   Temp:   97.9 F (36.6 C) 97.7 F (36.5 C)  TempSrc:   Oral Oral  SpO2:  98% 100% 98%  Weight:    91.4 kg  Height:    5' 6"  (1.676 m)    Intake/Output Summary (Last 24 hours) at 11/05/2018 1413 Last data filed at 11/05/2018 0752 Gross per 24 hour  Intake 1212.67 ml  Output -  Net 1212.67 ml   [employer]  11/04/18 1724 11/05/18 1245  Weight: 86.2 kg 91.4 kg    Examination:  General exam: Appears calm and comfortable.  No distress Respiratory system: Bilateral decreased breath sounds at bases Cardiovascular system: S1 & S2 heard, Rate controlled Gastrointestinal system: Abdomen is nondistended, soft and nontender. Normal bowel sounds heard. Extremities: No cyanosis, clubbing; trace  edema Central nervous system: Alert and oriented. No focal neurological deficits. Moving extremities Skin: No rashes, lesions or ulcers Psychiatry: Extremely anxious.    Data Reviewed: I have personally reviewed following labs and imaging studies  CBC: Recent Labs  Lab 11/04/18 1734 11/05/18 0546  WBC 6.4 6.1  NEUTROABS 4.0  --   HGB 11.5* 11.2*  HCT 34.2* 33.3*  MCV 90.5 89.3  PLT 187 759   Basic Metabolic Panel: Recent Labs  Lab 11/04/18 1734 11/05/18 0546  NA 134* 132*  K 3.4* 3.3*  CL 101 98  CO2 22 22  GLUCOSE 52* 53*  BUN 18 16  CREATININE 1.08* 1.01*  CALCIUM 9.0 8.8*   GFR: Estimated Creatinine Clearance: 48.9 mL/min (A) (by C-G formula based on SCr of 1.01 mg/dL (H)). Liver Function Tests: Recent Labs  Lab 11/04/18 1734 11/05/18 0546  AST 32 30  ALT 19 19  ALKPHOS 139* 128*  BILITOT 1.2 1.3*  PROT 6.8 6.5  ALBUMIN 3.4* 3.4*   No results for input(s): LIPASE, AMYLASE in the last 168 hours. No results for input(s): AMMONIA in the last 168 hours. Coagulation Profile: Recent Labs  Lab 11/04/18 1734  INR 1.1   Cardiac Enzymes: No results for input(s): CKTOTAL, CKMB, CKMBINDEX, TROPONINI in the last 168 hours. BNP (last 3 results) No results for input(s): PROBNP in the last 8760 hours. HbA1C: No results for input(s): HGBA1C in the last 72 hours. CBG: Recent Labs  Lab 11/05/18 0045 11/05/18 0233 11/05/18 0655 11/05/18 0826 11/05/18 1222  GLUCAP 123* 87 105* 100* 69*   Lipid Profile: No results for input(s): CHOL, HDL, LDLCALC, TRIG, CHOLHDL, LDLDIRECT in the last 72 hours. Thyroid Function Tests: Recent Labs    11/05/18 0546  TSH 2.941   Anemia Panel: No results for input(s): VITAMINB12, FOLATE, FERRITIN, TIBC, IRON, RETICCTPCT in the last 72 hours. Sepsis Labs: No results for input(s): PROCALCITON, LATICACIDVEN in the last 168 hours.  Recent Results (from the past 240 hour(s))  SARS Coronavirus 2 (CEPHEID - Performed in [place] hospital lab), Hosp Order     Status: None   Collection Time: 10/26/18  5:55 PM   Specimen: Nasopharyngeal Swab  Result Value Ref Range Status   SARS Coronavirus 2 NEGATIVE NEGATIVE Final    Comment: (NOTE) If result is NEGATIVE SARS-CoV-2 target nucleic acids are NOT DETECTED. The SARS-CoV-2 RNA is generally detectable in upper and lower  respiratory specimens during the acute phase of infection. The lowest  concentration of SARS-CoV-2 viral copies this assay can detect is 250  copies / mL. A negative result does not preclude SARS-CoV-2 infection  and should not be used as the sole basis for treatment or other  patient management decisions.  A negative result may occur with  improper specimen collection / handling, submission of specimen other  than nasopharyngeal swab, presence of viral mutation(s) within the  areas targeted by this assay, and inadequate number of viral copies  (<250 copies / mL). A negative result must be combined with clinical  observations, patient history, and epidemiological information. If result is POSITIVE SARS-CoV-2 target nucleic acids are DETECTED. The SARS-CoV-2 RNA is generally  detectable in upper and lower  respiratory specimens dur ing the acute phase of infection.  Positive  results are indicative of active infection with SARS-CoV-2.  Clinical  correlation with patient history and other diagnostic information is  necessary to determine patient infection status.  Positive results do  not rule out bacterial infection or co-infection with other viruses. If result is PRESUMPTIVE POSTIVE SARS-CoV-2 nucleic acids MAY BE PRESENT.   A presumptive positive result was obtained on the submitted specimen  and confirmed on repeat testing.  While 2019 novel coronavirus  (SARS-CoV-2) nucleic acids may be present in the submitted sample  additional confirmatory testing may be necessary for epidemiological  and / or clinical management purposes  to  differentiate between  SARS-CoV-2 and other Sarbecovirus currently known to infect humans.  If clinically indicated additional testing with an alternate test  methodology [phone]) is advised. The SARS-CoV-2 RNA is generally  detectable in upper and lower respiratory sp ecimens during the acute  phase of infection. The expected result is Negative. Fact Sheet for Patients:  StrictlyIdeas.no Fact Sheet for Healthcare Providers: BankingDealers.co.za This test is not yet approved or cleared by the [place] FDA and has been authorized for detection and/or diagnosis of SARS-CoV-2 by FDA under an Emergency Use Authorization (EUA).  This EUA will remain in effect (meaning this test can be used) for the duration of the COVID-19 declaration under Section 564(b)(1) of the Act, 21 U.S.C. section 360bbb-3(b)(1), unless the authorization is terminated or revoked sooner. Performed at [facility], [place] [address] [place], [place] [address]          Radiology Studies: Ct Head Wo Contrast  Result Date: 11/04/2018 CLINICAL DATA:  Headache. EXAM: CT HEAD WITHOUT CONTRAST TECHNIQUE: Contiguous axial images were obtained from the base of the skull through the vertex without intravenous contrast. COMPARISON:  Brain MRI 10/27/2018 FINDINGS: Brain: Minimal area of hypoattenuation along the sylvian fissure, in the posterior left insular cortex and left parietal lobe, better seen on recent MRI and thought to represent acute/subacute ischemic stroke. No new abnormalities are seen. No evidence of acute hemorrhage, hydrocephalus, extra-axial collections or mass lesion. Vascular: Calcific atherosclerotic disease of the intra cavernous carotid arteries and the skull base. Skull: Normal. Negative for fracture or focal lesion. Sinuses/Orbits: No acute finding. Other: None. IMPRESSION: Subtle area of hypoattenuation along the left Sylvian fissure, in the posterior  left insular cortex and left parietal lobe, better seen on recent MRI and thought to represent acute/subacute ischemic stroke. No additional findings of ischemia or intracranial hemorrhage by CT. Electronically Signed   By: Fidela Salisbury M.D.   On: 11/04/2018 18:59        Scheduled Meds: . enoxaparin (LOVENOX) injection  40 mg Subcutaneous Daily  . insulin aspart  0-5 Units Subcutaneous QHS  . insulin aspart  0-9 Units Subcutaneous TID WC   Continuous Infusions: . sodium chloride 75 mL/hr at 11/05/18 0828     LOS: 1 day        Aline August, MD [facility] 11/05/2018, 2:13 PM

## 2018-11-05 NOTE — Telephone Encounter (Signed)
Pt had LINQ alert 11/04/18. Need pt to send manual LINQ transmission when discharged from [facility].

## 2018-11-05 NOTE — Evaluation (Signed)
Physical Therapy Evaluation Patient Details Name: Tonya Hunter MRN: [id number] DOB: 06/15/1934 Today's Date: 11/05/2018   History of Present Illness  84 y.o. female admitted on 11/04/18 for weakness and HA.  CT with no evidence of bleeding, and showed her previously known stroke (stroke 10/26/18 s/p tPA d/c home on 10/30/18).  Pt was found to be hypoglycemic with a UTI.  Pt with other significant PMH of DM2, MI, HTN, MI, CAD, aortic stenosis, bil TKA, L humerus fx surgery, L wrist deformity.   Clinical Impression  Pt reporting near constant dizziness.  Basic vestibular screen complete without signs of vestibular issues.  Difficult to get her to preform the testing due to cognitive deficits and difficulty following more complex commands.  Pt is very self limiting in her mobility, but when she does move she does not require much assistance.  She is adamant that she will go home and not to rehab.   PT to follow acutely for deficits listed below.      Follow Up Recommendations Home health PT;Supervision/Assistance - 24 hour    Equipment Recommendations  None recommended by PT    Recommendations for Other Services   NA     Precautions / Restrictions Precautions Precautions: Fall Precaution Comments: pt is fearful of falling, had a big fall in April 2020 Restrictions Weight Bearing Restrictions: No      Mobility  Bed Mobility Overal bed mobility: Needs Assistance Bed Mobility: Supine to Sit;Sit to Supine     Supine to sit: Supervision;HOB elevated Sit to supine: Supervision;HOB elevated   General bed mobility comments: Supervision for safety, light use of rails, able to manage legs into and out of bed on her own.    Transfers Overall transfer level: Needs assistance Equipment used: Rolling walker (2 wheeled) Transfers: Sit to/from Stand Sit to Stand: Min assist         General transfer comment: Min assist to stand x 2 from bed.  Pt reporting, "I can't" almost self defeating  before trying.  Lighter assist needed for second stand EOB.    Ambulation/Gait Ambulation/Gait assistance: Min assist Gait Distance (Feet): 5 Feet(x2) Assistive device: Rolling walker (2 wheeled) Gait Pattern/deviations: Step-to pattern;Shuffle     General Gait Details: Pt was able to walk a few feet bakward and forward at EOB x2 with RW and min assist from therapist for balance.        Balance Overall balance assessment: Needs assistance Sitting-balance support: Feet supported;Bilateral upper extremity supported Sitting balance-Leahy Scale: Fair     Standing balance support: Bilateral upper extremity supported Standing balance-Leahy Scale: Poor Standing balance comment: needs external support from therapist and RW.                              Pertinent Vitals/Pain Pain Assessment: No/denies pain    Home Living Family/patient expects to be discharged to:: Private residence Living Arrangements: Spouse/significant other Available Help at Discharge: Family;Available 24 hours/day Type of Home: House Home Access: Stairs to enter Entrance Stairs-Rails: Left Entrance Stairs-Number of Steps: 3 Home Layout: One level Home Equipment: Walker - 2 wheels;Cane - single point      Prior Function Level of Independence: Independent with assistive device(s)         Comments: mod I with RW     Hand Dominance   Dominant Hand: Right    Extremity/Trunk Assessment   Upper Extremity Assessment Upper Extremity Assessment: Defer to OT evaluation  Lower Extremity Assessment Lower Extremity Assessment: Generalized weakness    Cervical / Trunk Assessment Cervical / Trunk Assessment: Normal  Communication   Communication: No difficulties  Cognition Arousal/Alertness: Awake/alert Behavior During Therapy: Anxious Overall Cognitive Status: Impaired/Different from baseline Area of Impairment: Orientation;Attention;Memory;Following  commands;Safety/judgement;Awareness;Problem solving                 Orientation Level: Disoriented to;Time(had to reference calendar, but good that she could) Current Attention Level: Sustained Memory: Decreased recall of precautions;Decreased short-term memory Following Commands: Follows multi-step commands inconsistently;Follows one step commands consistently Safety/Judgement: Decreased awareness of safety;Decreased awareness of deficits Awareness: Emergent Problem Solving: Difficulty sequencing;Requires verbal cues;Requires tactile cues General Comments: Pt very "I can't" personality, needing encouragment to try.  Pt has significant STM deficits (unable to remember that she already ate lunch--asked me to order it for her).               Assessment/Plan    PT Assessment Patient needs continued PT services  PT Problem List Decreased strength;Decreased activity tolerance;Decreased balance;Decreased mobility;Decreased knowledge of use of DME;Decreased cognition;Decreased safety awareness;Decreased knowledge of precautions       PT Treatment Interventions DME instruction;Gait training;Stair training;Functional mobility training;Therapeutic activities;Therapeutic exercise;Balance training;Patient/family education;Neuromuscular re-education;Cognitive remediation    PT Goals (Current goals can be found in the Care Plan section)  Acute Rehab PT Goals Patient Stated Goal: to feel better PT Goal Formulation: With patient Time For Goal Achievement: 11/19/18 Potential to Achieve Goals: Good    Frequency Min 3X/week           AM-PAC PT "6 Clicks" Mobility  Outcome Measure Help needed turning from your back to your side while in a flat bed without using bedrails?: A Little Help needed moving from lying on your back to sitting on the side of a flat bed without using bedrails?: A Little Help needed moving to and from a bed to a chair (including a wheelchair)?: A Little Help needed  standing up from a chair using your arms (e.g., wheelchair or bedside chair)?: A Little Help needed to walk in hospital room?: A Little Help needed climbing 3-5 steps with a railing? : A Little 6 Click Score: 18    End of Session Equipment Utilized During Treatment: Gait belt Activity Tolerance: Other (comment)(self limiting, reporting nausea and dizziness) Patient left: in bed;with call bell/phone within reach;with bed alarm set Nurse Communication: Mobility status PT Visit Diagnosis: History of falling (Z91.81);Muscle weakness (generalized) (M62.81);Other symptoms and signs involving the nervous system (F11.021)    Time: 1173-5670 PT Time Calculation (min) (ACUTE ONLY): 41 min   Charges:         Wells Guiles B. Kaily Wragg, PT, DPT  Acute Rehabilitation [phone] pager #[phone] office  @ [facility]: [phone]   PT Evaluation $PT Eval Moderate Complexity: 1 Mod PT Treatments $Therapeutic Activity: 23-37 mins        11/05/2018, 3:37 PM

## 2018-11-05 NOTE — ED Notes (Signed)
MS   Breakfast ordered

## 2018-11-05 NOTE — ED Notes (Signed)
ORDERED BFAST TRAY

## 2018-11-05 NOTE — ED Notes (Signed)
Hearty healthy/carb modified lunch tray ordered

## 2018-11-05 NOTE — ED Notes (Signed)
Ambulating a few steps from wheelchair to bed from bathroom made pt nauseated

## 2018-11-05 NOTE — ED Notes (Signed)
CBG collected. Result "61." RN, Emmy, notified.

## 2018-11-06 ENCOUNTER — Telehealth: Payer: Self-pay | Admitting: Pharmacist

## 2018-11-06 ENCOUNTER — Telehealth: Payer: Self-pay | Admitting: Diagnostic Neuroimaging

## 2018-11-06 LAB — GLUCOSE, CAPILLARY
Glucose-Capillary: 46 mg/dL — ABNORMAL LOW (ref 70–99)
Glucose-Capillary: 117 mg/dL — ABNORMAL HIGH (ref 70–99)
Glucose-Capillary: 192 mg/dL — ABNORMAL HIGH (ref 70–99)
Glucose-Capillary: 180 mg/dL — ABNORMAL HIGH (ref 70–99)
Glucose-Capillary: 174 mg/dL — ABNORMAL HIGH (ref 70–99)
Glucose-Capillary: 148 mg/dL — ABNORMAL HIGH (ref 70–99)
Glucose-Capillary: 192 mg/dL — ABNORMAL HIGH (ref 70–99)

## 2018-11-06 LAB — BASIC METABOLIC PANEL
Anion gap: 9 (ref 5–15)
BUN: 10 mg/dL (ref 8–23)
CO2: 26 mmol/L (ref 22–32)
Calcium: 8.4 mg/dL — ABNORMAL LOW (ref 8.9–10.3)
Chloride: 93 mmol/L — ABNORMAL LOW (ref 98–111)
Creatinine, Ser: 0.87 mg/dL (ref 0.44–1.00)
GFR calc Af Amer: >60 mL/min (ref >60)
GFR calc non Af Amer: >60 mL/min (ref >60)
Glucose, Bld: 178 mg/dL — ABNORMAL HIGH (ref 70–99)
Potassium: 3.7 mmol/L (ref 3.5–5.1)
Sodium: 128 mmol/L — ABNORMAL LOW (ref 135–145)

## 2018-11-06 LAB — CBC WITH DIFFERENTIAL/PLATELET
Abs Immature Granulocytes: 0.01 10*3/uL (ref 0.00–0.07)
Basophils Absolute: 0 10*3/uL (ref 0.0–0.1)
Basophils Relative: 1 %
Eosinophils Absolute: 0.2 10*3/uL (ref 0.0–0.5)
Eosinophils Relative: 4 %
HCT: 33.6 % — ABNORMAL LOW (ref 36.0–46.0)
Hemoglobin: 11.6 g/dL — ABNORMAL LOW (ref 12.0–15.0)
Immature Granulocytes: 0 %
Lymphocytes Relative: 23 %
Lymphs Abs: 1 10*3/uL (ref 0.7–4.0)
MCH: 30.4 pg (ref 26.0–34.0)
MCHC: 34.5 g/dL (ref 30.0–36.0)
MCV: 88.2 fL (ref 80.0–100.0)
Monocytes Absolute: 0.6 10*3/uL (ref 0.1–1.0)
Monocytes Relative: 12 %
Neutro Abs: 2.7 10*3/uL (ref 1.7–7.7)
Neutrophils Relative %: 60 %
Platelets: 165 10*3/uL (ref 150–400)
RBC: 3.81 MIL/uL — ABNORMAL LOW (ref 3.87–5.11)
RDW: 13.4 % (ref 11.5–15.5)
WBC: 4.4 10*3/uL (ref 4.0–10.5)
nRBC: 0 % (ref 0.0–0.2)

## 2018-11-06 LAB — MAGNESIUM: Magnesium: 1.3 mg/dL — ABNORMAL LOW (ref 1.7–2.4)

## 2018-11-06 MED ORDER — MECLIZINE HCL 25 MG PO TABS
25.0000 mg | ORAL_TABLET | Freq: Three times a day (TID) | ORAL | Status: DC | PRN
  Filled 2018-11-06: qty 1

## 2018-11-06 MED ORDER — MAGNESIUM SULFATE 2 GM/50ML IV SOLN
2.0000 g | Freq: Once | INTRAVENOUS | Status: AC
  Administered 2018-11-06: 14:00:00 2 g via INTRAVENOUS
  Filled 2018-11-06: qty 50

## 2018-11-06 MED ORDER — DEXTROSE-NACL 5-0.9 % IV SOLN
INTRAVENOUS | Status: DC
  Administered 2018-11-06: 14:00:00 via INTRAVENOUS

## 2018-11-06 MED ORDER — DEXTROSE 50 % IV SOLN
50.0000 mL | Freq: Once | INTRAVENOUS | Status: AC
  Administered 2018-11-06: 50 mL via INTRAVENOUS

## 2018-11-06 NOTE — Telephone Encounter (Signed)
Attempted to reach patient on hospital room phone; it immediately rang busy. Called daughter, [name] on [place] and advised her there is not mention of dementia in patient's notes. There is mention of mild confusion, however this was most likely related to her low blood sugar. [name] stated that her mother was quite upset, did not want that in her record, and [name] asked me to call and reassure her. [name] stated the only issue her mother has is with numbers and letters, but short and long term memories are intact. I advised I'll call her. [name] verbalized understanding, appreciation. Called patient and reassured her there's currently no record stating memory loss or dementia. She stated she didn't want to be sent somewhere she didn't belong. I continued to reassure her. She then stated she is supposed to have FU from her hospitalization with stroke, per Dr Erlinda Hong. Scheduled her for a 4 week FU per Dr Phoebe Sharps 10/26/18 discharge note. She verbalized understanding, appreciation.

## 2018-11-06 NOTE — Progress Notes (Signed)
PT Cancellation Note  Patient Details Name: Tonya Hunter MRN: [id number] DOB: [birth year]   Cancelled Treatment:    Reason Eval/Treat Not Completed: Patient declined, no reason specified.  Pt reports she "has to wait on the doctor" before she can do therapy.  "I need to talk to him first".  Tammy, RN attempted to convince pt to get up and participate with therapy as well without success.  She is ambulatory to the bathroom with RN assist and did work with me edge of bed yesterday, but has not walked more than about 10'.    Thanks,  Barbarann Ehlers. Anayla Giannetti, PT, DPT  Acute Rehabilitation [phone] pager [phone] office  @ [facility]: [phone]     Harvie Heck 11/06/2018, 10:35 AM

## 2018-11-06 NOTE — TOC Initial Note (Signed)
Transition of Care [facility]) - Initial/Assessment Note    Patient Details  Name: Tonya Hunter MRN: [id number] Date of Birth: 1934/08/16  [facility] CM/SW Contact:    Zenon Mayo, RN Phone Number: 11/06/2018, 12:36 PM  Clinical Narrative:                 From home with spouse, she is active with [facility] for [place], [place], [place] and she would like to continue services with [facility].  Tiffany with [facility] notified.  She was admitted with hypoglycemia and poss uti.   Expected Discharge Plan: [place] Barriers to Discharge: No Barriers Identified   Patient Goals and CMS Choice Patient states their goals for this hospitalization and ongoing recovery are:: do everything she did before CMS Medicare.gov Compare Post Acute Care list provided to:: Patient Choice offered to / list presented to : Patient  Expected Discharge Plan and Services Expected Discharge Plan: [place] In-house Referral: NA Discharge Planning Services: CM Consult Post Acute Care Choice: Resumption of Svcs/PTA Provider, Home Health Living arrangements for the past 2 months: Single Family Home                 DME Arranged: (NA)         HH Arranged: PT, OT, Speech Therapy [place]: [facility] [employer] Date [place]: 11/06/18 Time [place]: 86 Representative spoke with at [place]: [place]  Prior Living Arrangements/Services Living arrangements for the past 2 months: [place] Lives with:: Spouse Patient language and need for interpreter reviewed:: Yes Do you feel safe going back to the place where you live?: Yes      Need for Family Participation in Patient Care: Yes (Comment) Care giver support system in place?: Yes (comment)   Criminal Activity/Legal Involvement Pertinent to Current Situation/Hospitalization: No - Comment as needed  Activities of Daily Living Home Assistive Devices/Equipment: Cane  (specify quad or straight), Walker (specify type) ADL Screening (condition at time of admission) Patient's cognitive ability adequate to safely complete daily activities?: No Is the patient deaf or have difficulty hearing?: No Does the patient have difficulty seeing, even when wearing glasses/contacts?: No Does the patient have difficulty concentrating, remembering, or making decisions?: Yes Patient able to express need for assistance with ADLs?: No Does the patient have difficulty dressing or bathing?: No Independently performs ADLs?: Yes (appropriate for developmental age) Does the patient have difficulty walking or climbing stairs?: Yes Weakness of Legs: Both Weakness of Arms/Hands: None  Permission Sought/Granted                  Emotional Assessment Appearance:: Appears stated age Attitude/Demeanor/Rapport: Gracious Affect (typically observed): Appropriate Orientation: : Oriented to Self, Oriented to Place, Oriented to  Time, Oriented to Situation Alcohol / Substance Use: Not Applicable Psych Involvement: No (comment)  Admission diagnosis:  Hypoglycemia [E16.2] Acute UTI [N39.0] Altered mental status, unspecified altered mental status type [R41.82] Patient Active Problem List   Diagnosis Date Noted  . Hypoglycemia due to insulin 11/04/2018  . UTI (urinary tract infection) 11/04/2018  . Palpitations 10/29/2018  . Post concussion syndrome 10/29/2018  . Embolic stroke involving left middle cerebral artery ([place]) s/p tPA 10/26/2018  . Stroke-like symptoms 03/07/2017  . Left shoulder pain 03/07/2017  . Slurred speech   . Chest pain 09/15/2016  . Anemia 06/14/2016  . Hyperkalemia 06/14/2016  . Diabetes mellitus with complication ([place])   .  Upper gastrointestinal bleed 06/16/2014  . Hematemesis 06/16/2014  . NECK PAIN 10/28/2009  . OSTEOARTHRITIS 08/26/2008  . Hypothyroidism 08/26/2008  . IDDM (insulin dependent diabetes mellitus) ([place]) 01/03/2007  . Elevated lipids  01/03/2007  . Essential hypertension 01/03/2007  . Coronary atherosclerosis 01/03/2007  . CAD (coronary artery disease) 09/13/2005   PCP:  [name], MD Pharmacy:   [facility] [place], [place] AT [facility] [address] [place] [address] [place] [address] Phone: [phone] Fax: [phone]     Social Determinants of Health (SDOH) Interventions    Readmission Risk Interventions No flowsheet data found.

## 2018-11-06 NOTE — Progress Notes (Signed)
0322 Patient complains not feeling well.This nurse checked blood sugar 46.Hypogylcemic protocol used and 1 amp of d50 given. Rechecked blood sugar up 117.Text paged Forrest Moron NP.

## 2018-11-06 NOTE — Progress Notes (Addendum)
Inpatient Diabetes Program Recommendations  AACE/ADA: New Consensus Statement on Inpatient Glycemic Control (2015)  Target Ranges:  Prepandial:   less than 140 mg/dL      Peak postprandial:   less than 180 mg/dL (1-2 hours)      Critically ill patients:  140 - 180 mg/dL   Results for Tonya Hunter, Tonya Hunter (MRN [id number]) as of 11/06/2018 09:59  Ref. Range 11/04/2018 17:16 11/04/2018 18:24 11/04/2018 19:11 11/04/2018 20:52 11/04/2018 21:15  Glucose-Capillary Latest Ref Range: 70 - 99 mg/dL 52 (L) 52 (L) 73 32 (LL) 143 (H)   Results for Tonya Hunter, Tonya Hunter (MRN [id number]) as of 11/06/2018 09:59  Ref. Range 11/05/2018 08:26 11/05/2018 12:22 11/05/2018 15:48 11/05/2018 16:18 11/05/2018 20:03 11/05/2018 21:18  Glucose-Capillary Latest Ref Range: 70 - 99 mg/dL 100 (H) 69 (L) 44 (LL) 163 (H) 180 (H) 137 (H)   Results for Tonya Hunter, Tonya Hunter (MRN [id number]) as of 11/06/2018 09:59  Ref. Range 11/06/2018 03:22 11/06/2018 03:51 11/06/2018 06:09 11/06/2018 07:31  Glucose-Capillary Latest Ref Range: 70 - 99 mg/dL 46 (L) 117 (H) 192 (H) 180 (H)    Admit with: Hypoglycemia/ Weakness  History: DM, Recent CVA  Home DM Meds: Lantus 50 units QHS       Metformin 1000 mg BID        Humalog 75/25 Insulin 10 units TID with meals????  Current Orders: Novolog Sensitive Correction Scale/ SSI (0-9 units) TID AC + HS     Continues to have Hypoglycemia.  NO Insulin has been given to patient since admission on 06/21.  D5% IVF started yesterday 06/22 at 4:30pm--Running 75cc/hr.    MD- have placed a call to pt's PCP office to verify above DM medication regimen.  Pt likely needs downward adjustments of medications given admission with Hypoglycemia.    Addendum 3pm- Tried to call pt by phone but got no answer.  Called pt's daughter [name] by phone today.  Pt's dtr told me that patient has been slowly reducing her insulin (Lantus) dose at home.  Per dtr, pt asked her PCP (Dr. [name]) if she could reduce her insulin dose, however,  he did not want to reduce the dose.  Per dtr, pt went ahead and reduced the dose by herself.  Dtr could not confirm how much Lantus pt has been taking but did note that the patient does take Lantus at home.  Discussed with pt's dtr that the pt has NOT gotten any insulin from [name] in the hospital since admission.  Did review with dtr that pt has been having Hypoglycemia in the hospital since admission.  Discussed with pt's dtr that based on the fact that pt came in with HYPO and has been continuing to have HYPO despite no insulin being given, that it is likely that her home Lantus dose will either need to be reduced or stopped.  Encouraged pt's dtr to encourage her Mom (the patient) to check her CBGs often at home (recommend before all meals and occasionally after meals).  Reviewed home CBG goals.  Discussed with pt's dtr that we often determine if the Lantus dose is correct by testing CBGs in the AM (fasting)--If low in the AM, possibly too much Lantus and if high in the AM, possibly not enough Lantus.  Strongly encouraged follow up for further DM care with pt's PCP soon after d/c and encouraged pt's dtr to remind pt to take all CBG readings with her to next appt.    --Will follow patient during hospitalization--  Jamine Highfill  Lowella Dell RN, MSN, CDE Diabetes Coordinator Inpatient Glycemic Control Team Team Pager: [phone] (8a-5p)

## 2018-11-06 NOTE — Telephone Encounter (Signed)
Patient husband called and left VM on machine last night that wife was in hospital and had some questions about medications that Georgina Peer started her on. Per hospitalist note, patient was admitted to hypoglycemia and questionable UTI. Called patient in hospital. Patient kept saying that she has "low blood" and doesn't feel well. She thought that the lisinopril and metoprolol were causing her problem. I assured patient that her blood pressure looked fine this AM. Her problem is low blood sugars and these medications do not affect this.  Patient kept repeating that she has "low blood" and thinks her lisinopril and metoprolol are affecting this. Again reminded patient that her blood pressure (what these medications affect) seem to be fine. She has low blood sugar, possibly from too much insulin. Assured her that the staff at the hospital were trying to figure out why her blood sugars are so low. Patient stated that a doctor did not come see her yesterday. I stated that a hospital doctor did see her yesterday, there was a note written in the chart. Patient denies that doctor saw her. She also said the neurosurgeon didn't come. I advised her that the neurosurgeon wouldn't been consulted because her problem does not appear to be neurosurgery related. Again promised a hospitalist would see her today. Assured her that a doctor would be by today to see her and she should ask them the questions/conerns she has.  Patient states she thought there would be something we could do. Advised that we work in the outpatient clinic. There are doctors, nurses  and great pharmacists in the hospital that will help her while she is in the hospital. They are trying to figure out why she doesn't feel well and why her blood sugars have gone too low.  Conversation went around and around several times about low blood and the doctors not helping her, until patient said goodbye

## 2018-11-06 NOTE — Progress Notes (Signed)
Patient ID: Tonya Hunter, female   DOB: [birth year], 84 y.o.   MRN: [id number]  PROGRESS NOTE    INELL MIMBS  HFG:[id number] DOB: 1934/09/20 DOA: 11/04/2018 PCP: [name], MD   Brief Narrative:  84 year old female with history of recent CVA with TPA given on 10/26/18 discharged on 10/30/2018, aortic stenosis, diabetes mellitus type 2, hypertension, coronary disease, GERD, hypothyroidism presented with weakness and headache on 11/04/2018.  She was found to be hypoglycemic with blood sugars in the 60s.  CT of the head showed subtle area of hypoattenuation in the left sylvian fissure as seen in the previous MRI consistent with acute to subacute ischemic stroke with no evidence of intracranial bleed.  Assessment & Plan:   Principal Problem:   Hypoglycemia due to insulin Active Problems:   IDDM (insulin dependent diabetes mellitus) (HCC)   Essential hypertension   Hypothyroidism   CAD (coronary artery disease)   Diabetes mellitus with complication (HCC)   UTI (urinary tract infection)  Hypoglycemia Weakness/dizziness -Most likely the source of patient's weakness -Probably from insulin use. -Had hypoglycemic event early this morning again.  Currently on D5 drip.  Still feels weak and intermittent dizzy and not feeling well enough to go home.  Continue monitoring blood sugars.  Insulin on hold.  We will switch to D5 normal saline due to decreasing sodium level. -PT recommends home health PT. -We will add meclizine as needed for dizziness  UTI has been ruled out  -Urinalysis on presentation showed bacteria but no WBCs.  Patient is afebrile with no white count.  Patient was given a dose of ciprofloxacin and fosfomycin on presentation.  Will hold off on any further antibiotics  Hyponatremia -Oral intake still still poor.  Will switch to D5 normal saline at 75 cc an hour  Hypomagnesemia -Replace.  Repeat a.m. labs  History of recent most likely embolic left MCA stroke status post  TPA -Currently no signs of any focal neurologic deficit.  CT of the head on presentation showed subtle area of hypoattenuation in the left sylvian fissure as seen in the previous MRI consistent with acute to subacute ischemic stroke with no evidence of intracranial bleed. -Continue aspirin and Plavix and Zocor.  Outpatient follow-up with neurology  Diabetes mellitus type 2 with hypoglycemia -Continue CBG monitoring.  Hold insulin  Hypothyroidism -Continue levothyroxine  Hypertension -Monitor blood pressure.  Continue home regimen  History of palpitations History of CAD status post stenting -Patient had recent loop recorder placement during last hospitalization. -Continue metoprolol.  Postconcussion syndrome -Patient had fall in April 2020 and follows up with neurology/Dr. Leta Baptist for the same  Hyperlipidemia -continue Zocor 40 mg daily   DVT prophylaxis: Lovenox Code Status: Full Family Communication: None at bedside Disposition Plan: Home tomorrow if clinically improving Consultants: None  Procedures: None  Antimicrobials: 1 dose of ciprofloxacin and fosfomycin on admission   Subjective: Patient seen and examined at bedside.  She still feels weak and intermittently dizzy.  She does not think she is ready to go home today.  She is concerned that her blood sugar dropped again this morning.  No overnight fever or vomiting. Objective: Vitals:   11/05/18 2123 11/06/18 0510 11/06/18 0827 11/06/18 1134  BP: 128/65 (!) 144/77  130/66  Pulse: 82 75  67  Resp: 18 20  18   Temp: 98.2 F (36.8 C) 97.8 F (36.6 C) (!) 97.5 F (36.4 C) 97.9 F (36.6 C)  TempSrc: Oral Oral Oral Oral  SpO2: 100% 97%  99%  Weight:      Height:        Intake/Output Summary (Last 24 hours) at 11/06/2018 1234 Last data filed at 11/06/2018 0900 Gross per 24 hour  Intake 1370 ml  Output -  Net 1370 ml   Filed Weights   11/04/18 1724 11/05/18 1245  Weight: 86.2 kg 91.4 kg     Examination:  General exam: Appears calm and comfortable.  No acute distress. Respiratory system: Bilateral decreased breath sounds at bases.  No wheezing Cardiovascular system: S1 & S2 heard, Rate controlled Gastrointestinal system: Abdomen is nondistended, soft and nontender. Normal bowel sounds heard. Extremities: No cyanosis; trace edema  Data Reviewed: I have personally reviewed following labs and imaging studies  CBC: Recent Labs  Lab 11/04/18 1734 11/05/18 0546 11/06/18 0735  WBC 6.4 6.1 4.4  NEUTROABS 4.0  --  2.7  HGB 11.5* 11.2* 11.6*  HCT 34.2* 33.3* 33.6*  MCV 90.5 89.3 88.2  PLT 187 151 188   Basic Metabolic Panel: Recent Labs  Lab 11/04/18 1734 11/05/18 0546 11/06/18 0735  NA 134* 132* 128*  K 3.4* 3.3* 3.7  CL 101 98 93*  CO2 22 22 26   GLUCOSE 52* 53* 178*  BUN 18 16 10   CREATININE 1.08* 1.01* 0.87  CALCIUM 9.0 8.8* 8.4*  MG  --   --  1.3*   GFR: Estimated Creatinine Clearance: 56.7 mL/min (by C-G formula based on SCr of 0.87 mg/dL). Liver Function Tests: Recent Labs  Lab 11/04/18 1734 11/05/18 0546  AST 32 30  ALT 19 19  ALKPHOS 139* 128*  BILITOT 1.2 1.3*  PROT 6.8 6.5  ALBUMIN 3.4* 3.4*   No results for input(s): LIPASE, AMYLASE in the last 168 hours. No results for input(s): AMMONIA in the last 168 hours. Coagulation Profile: Recent Labs  Lab 11/04/18 1734  INR 1.1   Cardiac Enzymes: No results for input(s): CKTOTAL, CKMB, CKMBINDEX, TROPONINI in the last 168 hours. BNP (last 3 results) No results for input(s): PROBNP in the last 8760 hours. HbA1C: No results for input(s): HGBA1C in the last 72 hours. CBG: Recent Labs  Lab 11/06/18 0322 11/06/18 0351 11/06/18 0609 11/06/18 0731 11/06/18 1159  GLUCAP 46* 117* 192* 180* 174*   Lipid Profile: No results for input(s): CHOL, HDL, LDLCALC, TRIG, CHOLHDL, LDLDIRECT in the last 72 hours. Thyroid Function Tests: Recent Labs    11/05/18 0546  TSH 2.941   Anemia  Panel: No results for input(s): VITAMINB12, FOLATE, FERRITIN, TIBC, IRON, RETICCTPCT in the last 72 hours. Sepsis Labs: No results for input(s): PROCALCITON, LATICACIDVEN in the last 168 hours.  No results found for this or any previous visit (from the past 240 hour(s)).       Radiology Studies: Ct Head Wo Contrast  Result Date: 11/04/2018 CLINICAL DATA:  Headache. EXAM: CT HEAD WITHOUT CONTRAST TECHNIQUE: Contiguous axial images were obtained from the base of the skull through the vertex without intravenous contrast. COMPARISON:  Brain MRI 10/27/2018 FINDINGS: Brain: Minimal area of hypoattenuation along the sylvian fissure, in the posterior left insular cortex and left parietal lobe, better seen on recent MRI and thought to represent acute/subacute ischemic stroke. No new abnormalities are seen. No evidence of acute hemorrhage, hydrocephalus, extra-axial collections or mass lesion. Vascular: Calcific atherosclerotic disease of the intra cavernous carotid arteries and the skull base. Skull: Normal. Negative for fracture or focal lesion. Sinuses/Orbits: No acute finding. Other: None. IMPRESSION: Subtle area of hypoattenuation along the left Sylvian fissure, in the posterior left insular  cortex and left parietal lobe, better seen on recent MRI and thought to represent acute/subacute ischemic stroke. No additional findings of ischemia or intracranial hemorrhage by CT. Electronically Signed   By: Fidela Salisbury M.D.   On: 11/04/2018 18:59        Scheduled Meds: . aspirin EC  81 mg Oral Daily  . clopidogrel  75 mg Oral QHS  . enoxaparin (LOVENOX) injection  40 mg Subcutaneous Daily  . levothyroxine  137 mcg Oral QAC breakfast  . lisinopril  2.5 mg Oral q morning - 10a  . metoprolol succinate  50 mg Oral QHS  . simvastatin  40 mg Oral q1800   Continuous Infusions: . dextrose 75 mL/hr at 11/06/18 0527     LOS: 2 days        Aline August, MD [facility] 11/06/2018,  12:34 PM

## 2018-11-06 NOTE — Telephone Encounter (Signed)
Pt's daughter [name] called wanting RN to call pt in the hospital where she is admitted due to a stroke and they state that a provider in the hospital told the pt she has Dementia and walked out. Pt is very upset because she told them she has not been diagnosed with that and she does not want that put in her chart. Please advise. Pt's room number is [id number]

## 2018-11-06 NOTE — Progress Notes (Signed)
Physical Therapy Treatment Patient Details Name: Tonya Hunter MRN: [id number] DOB: Jun 19, 1934 Today's Date: 11/06/2018    History of Present Illness 84 y.o. female admitted on 11/04/18 for weakness and HA.  CT with no evidence of bleeding, and showed her previously known stroke (stroke 10/26/18 s/p tPA d/c home on 10/30/18).  Pt was found to be hypoglycemic with a UTI.  Pt with other significant PMH of DM2, MI, HTN, MI, CAD, aortic stenosis, bil TKA, L humerus fx surgery, L wrist deformity.     PT Comments    Pt was agreeable to get up and walk the hallway with me this PM.  She needs min guard assist overall for safety, but looked good up on her feet walking with RW.  She reports she is going to d/c home tomorrow and it looks as though case management is getting her home therapies resumed.  PT will continue to follow acutely for safe mobility progression   Follow Up Recommendations  Home health PT;Supervision/Assistance - 24 hour     Equipment Recommendations  None recommended by PT    Recommendations for Other Services   NA     Precautions / Restrictions Precautions Precautions: Fall Precaution Comments: pt is fearful of falling, had a big fall in April 2020    Mobility  Bed Mobility Overal bed mobility: Needs Assistance Bed Mobility: Supine to Sit;Sit to Supine     Supine to sit: Min guard Sit to supine: Min guard   General bed mobility comments: Min guard assist for safety  Transfers Overall transfer level: Needs assistance Equipment used: Rolling walker (2 wheeled) Transfers: Sit to/from Stand Sit to Stand: Min guard         General transfer comment: Min guard assist to stand from bed and lower toilet, using railing, min guard assist for safety, cues for safe hand placement.   Ambulation/Gait Ambulation/Gait assistance: Min guard Gait Distance (Feet): 85 Feet Assistive device: Rolling walker (2 wheeled) Gait Pattern/deviations: Step-through pattern;Staggering  left;Staggering right     General Gait Details: Pt with staggering gait pattern, at times lets go of RW while moving cues for safety.            Balance Overall balance assessment: Needs assistance Sitting-balance support: Feet supported;Bilateral upper extremity supported;No upper extremity supported Sitting balance-Leahy Scale: Fair     Standing balance support: Bilateral upper extremity supported;Single extremity supported Standing balance-Leahy Scale: Poor Standing balance comment: needs external assist.                             Cognition Arousal/Alertness: Awake/alert Behavior During Therapy: Agitated Overall Cognitive Status: Impaired/Different from baseline Area of Impairment: Orientation;Attention;Memory;Following commands;Safety/judgement;Awareness;Problem solving                 Orientation Level: Disoriented to;Time;Situation("my blood levels are low" her blood SUGAR is low) Current Attention Level: Sustained Memory: Decreased recall of precautions;Decreased short-term memory Following Commands: Follows multi-step commands inconsistently Safety/Judgement: Decreased awareness of deficits Awareness: Emergent Problem Solving: Difficulty sequencing;Requires verbal cues;Requires tactile cues General Comments: Pt likely at her post stroke baseline level of cognition.               Pertinent Vitals/Pain Pain Assessment: Faces Faces Pain Scale: Hurts little more Pain Location: L forearm IV site Pain Descriptors / Indicators: Stabbing;Burning Pain Intervention(s): Limited activity within patient's tolerance;Monitored during session;Repositioned;Other (comment)(RN notified)        PT Goals (current goals can  now be found in the care plan section) Acute Rehab PT Goals Patient Stated Goal: to feel better Progress towards PT goals: Progressing toward goals    Frequency    Min 3X/week      PT Plan Current plan remains appropriate        AM-PAC PT "6 Clicks" Mobility   Outcome Measure  Help needed turning from your back to your side while in a flat bed without using bedrails?: A Little Help needed moving from lying on your back to sitting on the side of a flat bed without using bedrails?: A Little Help needed moving to and from a bed to a chair (including a wheelchair)?: A Little Help needed standing up from a chair using your arms (e.g., wheelchair or bedside chair)?: A Little Help needed to walk in hospital room?: A Little Help needed climbing 3-5 steps with a railing? : A Little 6 Click Score: 18    End of Session Equipment Utilized During Treatment: Gait belt Activity Tolerance: Patient tolerated treatment well Patient left: in bed;with call bell/phone within reach;with bed alarm set Nurse Communication: Mobility status PT Visit Diagnosis: History of falling (Z91.81);Muscle weakness (generalized) (M62.81);Other symptoms and signs involving the nervous system (O97.353)     Time: 2992-4268 PT Time Calculation (min) (ACUTE ONLY): 36 min  Charges:  $Gait Training: 8-22 mins $Therapeutic Activity: 8-22 mins          Danielly Ackerley B. Febe Champa, PT, DPT  Acute Rehabilitation [phone] pager [phone] office  @ [facility]: [phone]            11/06/2018, 4:29 PM

## 2018-11-07 ENCOUNTER — Telehealth: Payer: Self-pay

## 2018-11-07 DIAGNOSIS — E119 Type 2 diabetes mellitus without complications: Secondary | ICD-10-CM

## 2018-11-07 DIAGNOSIS — Z794 Long term (current) use of insulin: Secondary | ICD-10-CM

## 2018-11-07 DIAGNOSIS — N3000 Acute cystitis without hematuria: Secondary | ICD-10-CM

## 2018-11-07 LAB — CBC WITH DIFFERENTIAL/PLATELET
Abs Immature Granulocytes: 0.01 10*3/uL (ref 0.00–0.07)
Basophils Absolute: 0 10*3/uL (ref 0.0–0.1)
Basophils Relative: 1 %
Eosinophils Absolute: 0.2 10*3/uL (ref 0.0–0.5)
Eosinophils Relative: 4 %
HCT: 33.1 % — ABNORMAL LOW (ref 36.0–46.0)
Hemoglobin: 11.2 g/dL — ABNORMAL LOW (ref 12.0–15.0)
Immature Granulocytes: 0 %
Lymphocytes Relative: 23 %
Lymphs Abs: 0.9 10*3/uL (ref 0.7–4.0)
MCH: 30.3 pg (ref 26.0–34.0)
MCHC: 33.8 g/dL (ref 30.0–36.0)
MCV: 89.5 fL (ref 80.0–100.0)
Monocytes Absolute: 0.6 10*3/uL (ref 0.1–1.0)
Monocytes Relative: 15 %
Neutro Abs: 2.3 10*3/uL (ref 1.7–7.7)
Neutrophils Relative %: 57 %
Platelets: 154 10*3/uL (ref 150–400)
RBC: 3.7 MIL/uL — ABNORMAL LOW (ref 3.87–5.11)
RDW: 13.6 % (ref 11.5–15.5)
WBC: 4 10*3/uL (ref 4.0–10.5)
nRBC: 0 % (ref 0.0–0.2)

## 2018-11-07 LAB — MAGNESIUM: Magnesium: 1.8 mg/dL (ref 1.7–2.4)

## 2018-11-07 LAB — GLUCOSE, CAPILLARY
Glucose-Capillary: 201 mg/dL — ABNORMAL HIGH (ref 70–99)
Glucose-Capillary: 222 mg/dL — ABNORMAL HIGH (ref 70–99)
Glucose-Capillary: 167 mg/dL — ABNORMAL HIGH (ref 70–99)

## 2018-11-07 LAB — BASIC METABOLIC PANEL
Anion gap: 6 (ref 5–15)
BUN: 8 mg/dL (ref 8–23)
CO2: 25 mmol/L (ref 22–32)
Calcium: 8.6 mg/dL — ABNORMAL LOW (ref 8.9–10.3)
Chloride: 100 mmol/L (ref 98–111)
Creatinine, Ser: 0.9 mg/dL (ref 0.44–1.00)
GFR calc Af Amer: >60 mL/min (ref >60)
GFR calc non Af Amer: 60 mL/min — ABNORMAL LOW (ref >60)
Glucose, Bld: 213 mg/dL — ABNORMAL HIGH (ref 70–99)
Potassium: 3.9 mmol/L (ref 3.5–5.1)
Sodium: 131 mmol/L — ABNORMAL LOW (ref 135–145)

## 2018-11-07 MED ORDER — METFORMIN HCL 500 MG PO TABS: 500.0000 mg | ORAL_TABLET | Freq: Two times a day (BID) | ORAL | 0 refills | Status: DC

## 2018-11-07 MED ORDER — FUROSEMIDE 40 MG PO TABS: 20.0000 mg | ORAL_TABLET | Freq: Every day | ORAL | 0 refills | Status: DC

## 2018-11-07 MED ORDER — ACETAMINOPHEN 325 MG PO TABS: 650.0000 mg | ORAL_TABLET | Freq: Four times a day (QID) | ORAL | 0 refills | Status: DC | PRN

## 2018-11-07 MED ORDER — INSULIN GLARGINE 100 UNIT/ML ~~LOC~~ SOLN: 6.0000 [IU] | Freq: Every day | SUBCUTANEOUS | 11 refills | Status: DC

## 2018-11-07 NOTE — Consult Note (Signed)
   [facility] CM Inpatient Consult   11/07/2018  ADAYSHA DUBINSKY [birth year] [id number]    Patient reviewed for readmissions within 7 days and within 30 days, with a 19% medium risk score for unplanned readmission and has 2 hospitalizations and 1 ED visit in the past 6 months, and alsoto check if potential [place] Management services are needed with herMedicare/ NextGen benefits.  Chart review and MD's brief narrative note on6/22/20 reveal as follows:  Patient is as 84 year old female with history of recent CVA with TPA given on 10/26/18 discharged on 10/30/2018, aortic stenosis, diabetes mellitus type 2, hypertension, coronary disease, GERD, hypothyroidism presented with weakness and headache on 11/04/2018.  She was found to be hypoglycemic with blood sugars in the 60s.  CT of the head showed subtle area of hypoattenuation in the left sylvian fissure as seen in the previous MRI consistent with acute to subacute ischemic stroke with no evidence of intracranial bleed. (Hypoglycemia due to insulin- IDDM- insulin dependent diabetes mellitus, weakness, poss. UTI) Inpatient DM coordinator has been following patient on this hospitalization.   Called twice to speak to patient in her room but no response. Staff reports that patient is currently on another line talking to someone. Called patient's husband [name]) and he gave patient's daughter's number [name]) to call. Call placed to patient's daughter and was able to speak to her (HIPAA verified). Discussed about patient's DM management as noted on Inpatient DM coordinator's note on 11/06/18 and RN note 11/07/18. Patient's daughter admits definitely needing assistance in managing health conditions-specifically low blood sugars with insulin- Lantus. Daughter reports that patient will be discharging and staying at her house and that she is giving consent for [facility] care management services to follow-up patient post discharge mainly with regards to DM  management (recent A1c is 7.6) and will let patient know about it.  Referral made to [place] care coordinator for complex disease management of DM and assess further needs post discharge.  PT has recommended home health PT at discharge. Pertransition of care CMnote reviewed, patient is from home with spouse, she is active with [facility] for [place], [place], [place] and she would like to continue services with [facility]. Patient will transition home with resumption of home health services from [facility]  ([facility]).  [name].  Reached out to Transition of care RN CM to notify of [facility] care management following patient post discharge.   For questions and additional information, please call:  Beatric Fulop A. Judith Demps, BSN, RN-BC [facility] Liaison Cell: [phone]

## 2018-11-07 NOTE — Telephone Encounter (Signed)
I was calling the pt about her wound check appointment and to do a Covid-19 prescreening. The pt husband states she will not be able to make her wound check appointment. He states he do not think she is still in the hospital but think she is at her daughter's house. I told him I will let the nurse know so he can get a call to reschedule that appointment.

## 2018-11-07 NOTE — Progress Notes (Signed)
Attempted to educate patient on symptoms of hypoglycemia and overall plan of care.  Patient having poor recall of material presented.  Appears to have poor understanding of symptoms of hypoglycemia and continues to interchange the term "low blood" to mean both low blood sugar and low blood pressure.  She re-verberates and has poor short term memory.  Appears vague at times.  Spoke with daughter, who stated to me that her husband works four or so hours a day three days a week at a [employer].  She also told me that she has difficulty "with her insulin.  The plummer had to help her with it.Marland KitchenMarland Kitchen"

## 2018-11-07 NOTE — Telephone Encounter (Signed)
Spoke with pt husband and he states he will be taking the pt home monitor to their daughter house so she can do the transmission. It looks like a transmission was done at the hospital for 11-07-2018 at 11:41 am.

## 2018-11-08 ENCOUNTER — Other Ambulatory Visit: Payer: Self-pay

## 2018-11-08 ENCOUNTER — Telehealth: Payer: Medicare Other | Admitting: *Deleted

## 2018-11-08 DIAGNOSIS — I63412 Cerebral infarction due to embolism of left middle cerebral artery: Secondary | ICD-10-CM

## 2018-11-08 LAB — CUP PACEART INCLINIC DEVICE CHECK
Date Time Interrogation Session: 20200625121537
Implantable Pulse Generator Implant Date: 20200615

## 2018-11-08 NOTE — Telephone Encounter (Signed)
Pt daughter agrees to send picture of wound. Will review when picture received.

## 2018-11-08 NOTE — Discharge Summary (Signed)
Physician Discharge Summary  Tonya Hunter DZH:[id number] DOB: 05-Jun-1934 DOA: 11/04/2018  PCP: [name], MD  Admit date: 11/04/2018 Discharge date: 11/08/2018  Recommendations for Outpatient Follow-up:  1. Follow up with PCP in 7-10 days. Take glucose readings in when you see him. 2. Keep wound care appointments.  Follow-up Information    Home, [facility] up.   Specialty: Sidney Why: PT OT ST Contact information: [place] [address] [phone]            Discharge Diagnoses: Principal diagnosis is #1 1. Hypoglycemia 2. IDDM 3. Hypertention 4. UTI 5. Dementia  Discharge Condition: Fair Disposition: Home with daughter  Diet recommendation: Carbohydrate controlled diet.  Filed Weights   11/04/18 1724 11/05/18 1245  Weight: 86.2 kg 91.4 kg    History of present illness: Tonya Hunter is a 84 y.o. female with medical history significant of recent CVA with TPA given on June 12 and discharged on June 16, aortic stenosis, diabetes, hypertension, coronary artery disease, GERD, hypothyroidism who was brought to the ER due to abnormal feeling which she describes a funny feeling behind both her eyes.  Symptoms started about 3:00 this afternoon.  She thought she was having another CVA.  She had no focal weakness.  On arrival patient was found to be hypoglycemic.  Further evaluation showed UTI.  No focal neurologic deficits to suggest another stroke.  Patient still has some headache which is throbbing.  No fever or chills no nausea vomiting or diarrhea.  She did have some dysuria.  Patient continues to take her medications following the stroke.Marland Kitchen   Hospital Course:  84 year old female with history of recent CVA with TPA given on 10/26/18 discharged on 10/30/2018, aortic stenosis, diabetes mellitus type 2, hypertension, coronary disease, GERD, hypothyroidism presented with weakness and headache on 11/04/2018.  She was found to be  hypoglycemic with blood sugars in the 60s.  CT of the head showed subtle area of hypoattenuation in the left sylvian fissure as seen in the previous MRI consistent with acute to subacute ischemic stroke with no evidence of intracranial bleed. It was clear to this writer on 11/07/2018 that the patient had no ability to manage her insulin, I spoke with her daughter who is an emergency contact. She agreed that the patient's medications and glucoses could not be managed at home. She agreed to take the patient home with her. Initially it seemed that the patient would refuse this disposition, but ultimately she agreed.  Today's assessment: S: The patient is sitting up in bed. She is anxious and agitated. She cannot keep details of her care and her glucoses straight. No new complaints. O: Vitals:  Vitals:   11/07/18 0813 11/07/18 1602  BP: 122/67 (!) 156/81  Pulse:  72  Resp:  18  Temp:  97.9 F (36.6 C)  SpO2:  98%    Constitutional:  . The patient is anxious and agitated. No acute distress other than anxiety. Respiratory:  . No increased work of breathing. . No wheezes, rales, or rhonchi. . No tactile fremitus. Cardiovascular:  . Regular rate and rhythm . No murmurs, ectopy, or gallups . No lateral PMI. No thrills. Abdomen:  . Abdomen is soft, non-tender, non-distended . No hernias, masses, or organomegaly are appreciated. . Normoactive bowel sounds. Musculoskeletal:  . No cyanosis, clubbing, or edema Skin:  . No rashes, lesions, ulcers . palpation of skin: no induration or nodules Neurologic:  . CN 2-12 intact .  Sensation all 4 extremities intact Psychiatric:  . judgement and insight are not intact. . Mental status o Mood is anxious. Affect is agitated. o Orientation to person, place, time.    Discharge Instructions  Discharge Instructions    AMB Referral to [facility]   Complete by: As directed    Patient is as 84 year old female with history of recent CVA with  TPA given on 10/26/18 discharged on 10/30/2018, aortic stenosis, diabetes mellitus type 2, hypertension, coronary disease, GERD, hypothyroidism presented with weakness and headache on 11/04/2018.  She was found to be hypoglycemic with blood sugars in the 60s.   (Hypoglycemia due to insulin- IDDM- insulin dependent diabetes mellitus, weakness, poss. UTI) Inpatient DM coordinator has been following patient on this hospitalization.   Was able to talk to daughter and discussed about patient's DM management as noted on Inpatient DM coordinator's note on 11/07/18. Patient's daughter admits  definitely needing assistance in managing health conditions-specifically low blood sugars with insulin (Lantus), needing to monitor and record blood sugar regularly and bringing results to provider's appointment, following a healthier diet according to diabetes restrictions. Daughter reports that patient will be discharging to her house and is giving consent for [facility] care management services to follow-up patient post discharge mainly with regards to DM management (recent A1c is 7.6).     Note:   Please contact patient through her daughter Tonya Hunter) on her phone numbers listed in [place].  Please send a consent form to obtain written consent for [facility] care management services.   Reason for consult: [facility] care coordinator for complex disease management of DM and assess further needs post discharge.   Diagnoses of: Diabetes   Expected date of contact: 1-3 days (reserved for hospital discharges)   Activity as tolerated - No restrictions   Complete by: As directed    Call MD for:   Complete by: As directed    Glucoses persistently greater than 275, or below 70.   Call MD for:  persistant nausea and vomiting   Complete by: As directed    Call MD for:  severe uncontrolled pain   Complete by: As directed    Diet - low sodium heart healthy   Complete by: As directed    Discharge instructions   Complete by: As  directed    Check glucose once in the morning before breakfast and once in the evening before dinner. Write these values down and take them in to her PCP when she goes for follow up visit. Also check glucose if the patient becomes lethargic, shaky, sweaty, confused, or anxious. Give orange juice for glucose less than 70.   Increase activity slowly   Complete by: As directed      Allergies as of 11/07/2018      Reactions   Demerol [meperidine] Nausea And Vomiting   Penicillins Swelling, Other (See Comments)   Caused weakness and swelling of the feet Has patient had a PCN reaction causing immediate rash, facial/tongue/throat swelling, SOB or lightheadedness with hypotension: Yes Has patient had a PCN reaction causing severe rash involving mucus membranes or skin necrosis: Unk Has patient had a PCN reaction that required hospitalization: Yes Has patient had a PCN reaction occurring within the last 10 years: No If all of the above answers are "NO", then may proceed with Cephalosporin use.      Medication List    TAKE these medications   Accu-Chek SmartView test strip Generic drug: glucose blood 1 each by  Other route 3 (three) times daily.   acetaminophen 325 MG tablet Commonly known as: TYLENOL Take 2 tablets (650 mg total) by mouth every 6 (six) hours as needed for mild pain, fever or headache.   aspirin 81 MG EC tablet Take 1 tablet (81 mg total) by mouth daily. Notes to patient: Take next dose tomorrow morning   clopidogrel 75 MG tablet Commonly known as: PLAVIX Take 1 tablet (75 mg total) by mouth at bedtime. Notes to patient: Next dose due this evening   furosemide 40 MG tablet Commonly known as: LASIX Take 0.5 tablets (20 mg total) by mouth daily. What changed: how much to take Notes to patient: Next dose due tomorrow morning   insulin glargine 100 UNIT/ML injection Commonly known as: LANTUS Inject 0.06 mLs (6 Units total) into the skin at bedtime. What changed: how  much to take Notes to patient: Next dose due at bedtime   levothyroxine 137 MCG tablet Commonly known as: SYNTHROID Take 137 mcg by mouth daily before breakfast. Notes to patient: Next dose due tomorrow morning   lisinopril 2.5 MG tablet Commonly known as: ZESTRIL Take 1 tablet (2.5 mg total) by mouth at bedtime. What changed: when to take this Notes to patient: Next dose due tomorrow evening   metFORMIN 500 MG tablet Commonly known as: GLUCOPHAGE Take 1 tablet (500 mg total) by mouth 2 (two) times daily with a meal. What changed:   how much to take  when to take this Notes to patient: Next dose due this evening with supper   metoprolol succinate 50 MG 24 hr tablet Commonly known as: TOPROL-XL Take 1 tablet (50 mg total) by mouth at bedtime. Take with or immediately following a meal. Notes to patient: Next dose due tonight   nitroGLYCERIN 0.4 MG SL tablet Commonly known as: NITROSTAT Place 1 tablet (0.4 mg total) under the tongue every 5 (five) minutes as needed for chest pain. Max 3 doses. What changed:   when to take this  additional instructions   PreserVision AREDS 2 Caps Take 1 capsule by mouth 2 (two) times daily. Notes to patient: Next dose due tomorrow morning   simvastatin 40 MG tablet Commonly known as: ZOCOR Take 1 tablet (40 mg total) by mouth daily at 6 PM. Notes to patient: Next dose due this evening      Allergies  Allergen Reactions  . Demerol [Meperidine] Nausea And Vomiting  . Penicillins Swelling and Other (See Comments)    Caused weakness and swelling of the feet Has patient had a PCN reaction causing immediate rash, facial/tongue/throat swelling, SOB or lightheadedness with hypotension: Yes Has patient had a PCN reaction causing severe rash involving mucus membranes or skin necrosis: Unk Has patient had a PCN reaction that required hospitalization: Yes Has patient had a PCN reaction occurring within the last 10 years: No If all of the  above answers are "NO", then may proceed with Cephalosporin use.    The results of significant diagnostics from this hospitalization (including imaging, microbiology, ancillary and laboratory) are listed below for reference.    Significant Diagnostic Studies: Ct Angio Head W Or Wo Contrast  Result Date: 10/26/2018 CLINICAL DATA:  Right arm weakness and left facial droop EXAM: CT ANGIOGRAPHY HEAD AND NECK TECHNIQUE: Multidetector CT imaging of the head and neck was performed using the standard protocol during bolus administration of intravenous contrast. Multiplanar CT image reconstructions and MIPs were obtained to evaluate the vascular anatomy. Carotid stenosis measurements (when applicable) are obtained  utilizing NASCET criteria, using the distal internal carotid diameter as the denominator. CONTRAST:  72m OMNIPAQUE IOHEXOL 350 MG/ML SOLN COMPARISON:  Head CT 10/26/2018 and 09/03/2018 Chest CT 08/29/2016 FINDINGS: CTA NECK FINDINGS SKELETON: There is no bony spinal canal stenosis. No lytic or blastic lesion. OTHER NECK: Normal pharynx, larynx and major salivary glands. No cervical lymphadenopathy. Unremarkable thyroid gland. UPPER CHEST: 8 mm right upper lobe nodule, unchanged compared to 08/29/2016 chest CT. Stability for greater than 2 years indicates benignity. AORTIC ARCH: There is mild calcific atherosclerosis of the aortic arch. There is no aneurysm, dissection or hemodynamically significant stenosis of the visualized ascending aorta and aortic arch. Conventional 3 vessel aortic branching pattern. The visualized proximal subclavian arteries are widely patent. RIGHT CAROTID SYSTEM: --Common carotid artery: Widely patent origin without common carotid artery dissection or aneurysm. --Internal carotid artery: No dissection, occlusion or aneurysm. Mild atherosclerotic calcification at the carotid bifurcation without hemodynamically significant stenosis. --External carotid artery: No acute abnormality.  LEFT CAROTID SYSTEM: --Common carotid artery: Widely patent origin without common carotid artery dissection or aneurysm. --Internal carotid artery: No dissection, occlusion or aneurysm. Mild atherosclerotic calcification at the carotid bifurcation without hemodynamically significant stenosis. --External carotid artery: No acute abnormality. VERTEBRAL ARTERIES: Codominant configuration. Both origins are clearly patent. No dissection, occlusion or flow-limiting stenosis to the skull base (V1-V3 segments). CTA HEAD FINDINGS POSTERIOR CIRCULATION: --Vertebral arteries: Atherosclerotic calcification of the left V4 segment without greater than 50% stenosis. --Posterior inferior cerebellar arteries (PICA): Patent origins from the vertebral arteries. --Anterior inferior cerebellar arteries (AICA): Patent origins from the basilar artery. --Basilar artery: Normal. --Superior cerebellar arteries: Normal. --Posterior cerebral arteries (PCA): Normal. Both originate from the basilar artery. Posterior communicating arteries (p-comm) are diminutive or absent. ANTERIOR CIRCULATION: --Intracranial internal carotid arteries: Atherosclerotic calcification of the internal carotid arteries at the skull base without hemodynamically significant stenosis. --Anterior cerebral arteries (ACA): Normal. Both A1 segments are present. Patent anterior communicating artery (a-comm). --Middle cerebral arteries (MCA): Normal. VENOUS SINUSES: As permitted by contrast timing, patent. ANATOMIC VARIANTS: None Review of the MIP images confirms the above findings. IMPRESSION: No emergent large vessel occlusion or hemodynamically significant stenosis by NASCET criteria. Electronically Signed   By: KUlyses JarredM.D.   On: 10/26/2018 17:46   Ct Head Wo Contrast  Result Date: 11/04/2018 CLINICAL DATA:  Headache. EXAM: CT HEAD WITHOUT CONTRAST TECHNIQUE: Contiguous axial images were obtained from the base of the skull through the vertex without intravenous  contrast. COMPARISON:  Brain MRI 10/27/2018 FINDINGS: Brain: Minimal area of hypoattenuation along the sylvian fissure, in the posterior left insular cortex and left parietal lobe, better seen on recent MRI and thought to represent acute/subacute ischemic stroke. No new abnormalities are seen. No evidence of acute hemorrhage, hydrocephalus, extra-axial collections or mass lesion. Vascular: Calcific atherosclerotic disease of the intra cavernous carotid arteries and the skull base. Skull: Normal. Negative for fracture or focal lesion. Sinuses/Orbits: No acute finding. Other: None. IMPRESSION: Subtle area of hypoattenuation along the left Sylvian fissure, in the posterior left insular cortex and left parietal lobe, better seen on recent MRI and thought to represent acute/subacute ischemic stroke. No additional findings of ischemia or intracranial hemorrhage by CT. Electronically Signed   By: DFidela SalisburyM.D.   On: 11/04/2018 18:59   Ct Angio Neck W Or Wo Contrast  Result Date: 10/26/2018 CLINICAL DATA:  Right arm weakness and left facial droop EXAM: CT ANGIOGRAPHY HEAD AND NECK TECHNIQUE: Multidetector CT imaging of the head and neck was performed  using the standard protocol during bolus administration of intravenous contrast. Multiplanar CT image reconstructions and MIPs were obtained to evaluate the vascular anatomy. Carotid stenosis measurements (when applicable) are obtained utilizing NASCET criteria, using the distal internal carotid diameter as the denominator. CONTRAST:  71m OMNIPAQUE IOHEXOL 350 MG/ML SOLN COMPARISON:  Head CT 10/26/2018 and 09/03/2018 Chest CT 08/29/2016 FINDINGS: CTA NECK FINDINGS SKELETON: There is no bony spinal canal stenosis. No lytic or blastic lesion. OTHER NECK: Normal pharynx, larynx and major salivary glands. No cervical lymphadenopathy. Unremarkable thyroid gland. UPPER CHEST: 8 mm right upper lobe nodule, unchanged compared to 08/29/2016 chest CT. Stability for  greater than 2 years indicates benignity. AORTIC ARCH: There is mild calcific atherosclerosis of the aortic arch. There is no aneurysm, dissection or hemodynamically significant stenosis of the visualized ascending aorta and aortic arch. Conventional 3 vessel aortic branching pattern. The visualized proximal subclavian arteries are widely patent. RIGHT CAROTID SYSTEM: --Common carotid artery: Widely patent origin without common carotid artery dissection or aneurysm. --Internal carotid artery: No dissection, occlusion or aneurysm. Mild atherosclerotic calcification at the carotid bifurcation without hemodynamically significant stenosis. --External carotid artery: No acute abnormality. LEFT CAROTID SYSTEM: --Common carotid artery: Widely patent origin without common carotid artery dissection or aneurysm. --Internal carotid artery: No dissection, occlusion or aneurysm. Mild atherosclerotic calcification at the carotid bifurcation without hemodynamically significant stenosis. --External carotid artery: No acute abnormality. VERTEBRAL ARTERIES: Codominant configuration. Both origins are clearly patent. No dissection, occlusion or flow-limiting stenosis to the skull base (V1-V3 segments). CTA HEAD FINDINGS POSTERIOR CIRCULATION: --Vertebral arteries: Atherosclerotic calcification of the left V4 segment without greater than 50% stenosis. --Posterior inferior cerebellar arteries (PICA): Patent origins from the vertebral arteries. --Anterior inferior cerebellar arteries (AICA): Patent origins from the basilar artery. --Basilar artery: Normal. --Superior cerebellar arteries: Normal. --Posterior cerebral arteries (PCA): Normal. Both originate from the basilar artery. Posterior communicating arteries (p-comm) are diminutive or absent. ANTERIOR CIRCULATION: --Intracranial internal carotid arteries: Atherosclerotic calcification of the internal carotid arteries at the skull base without hemodynamically significant stenosis.  --Anterior cerebral arteries (ACA): Normal. Both A1 segments are present. Patent anterior communicating artery (a-comm). --Middle cerebral arteries (MCA): Normal. VENOUS SINUSES: As permitted by contrast timing, patent. ANATOMIC VARIANTS: None Review of the MIP images confirms the above findings. IMPRESSION: No emergent large vessel occlusion or hemodynamically significant stenosis by NASCET criteria. Electronically Signed   By: KUlyses JarredM.D.   On: 10/26/2018 17:46   Mr Brain Wo Contrast  Result Date: 10/27/2018 CLINICAL DATA:  Stroke, follow-up. Right upper and lower extremity flaccid paralysis. Status post tPA. EXAM: MRI HEAD WITHOUT CONTRAST TECHNIQUE: Multiplanar, multiecho pulse sequences of the brain and surrounding structures were obtained without intravenous contrast. COMPARISON:  CTA head and neck 10/26/2018.  MRI brain 09/28/2018 FINDINGS: Brain: Diffusion-weighted images demonstrate an acute/subacute nonhemorrhagic infarct involving the posterior aspect of the left insular cortex. There is involvement of the sylvian fissure. Additional areas of cortical involvement extends superiorly in the left parietal lobe. Primary motor cortex is spared. T2 hyperintensities are associated with the areas of restricted diffusion. This corresponds with the clinical time frame of approximately 24 hours. Other periventricular and subcortical T2 hyperintensities bilaterally are stable. The ventricles are of normal size. No significant extraaxial fluid collection is present. A remote lacunar infarct is present in the left cerebellum. Brainstem and cerebellum are otherwise within normal limits. Vascular: Flow is present in the major intracranial arteries. Skull and upper cervical spine: Degenerative changes are noted at the cervical spine, particularly at  C4-5. There is a broad-based disc osteophyte complex at C3-4 is well. Craniocervical junction is otherwise normal. Midline structures are within normal limits.  Sinuses/Orbits: The paranasal sinuses and mastoid air cells are clear. Bilateral lens replacements are noted. Globes and orbits are otherwise unremarkable. IMPRESSION: 1. Acute/subacute nonhemorrhagic infarct of the posterior left MCA division involving the posterior left insular cortex, sylvian fissure, and left parietal lobe. 2. Other atrophy and white matter disease is within normal limits for age. 3. Degenerative changes of the cervical spine are most pronounced at C4-5. Electronically Signed   By: San Morelle M.D.   On: 10/27/2018 15:13   Ct Head Code Stroke Wo Contrast  Result Date: 10/26/2018 CLINICAL DATA:  Code stroke. Right arm weakness and left facial droop EXAM: CT HEAD WITHOUT CONTRAST TECHNIQUE: Contiguous axial images were obtained from the base of the skull through the vertex without intravenous contrast. COMPARISON:  Head CT 09/03/2018 FINDINGS: Brain: There is no mass, hemorrhage or extra-axial collection. The size and configuration of the ventricles and extra-axial CSF spaces are normal. There is hypoattenuation of the periventricular white matter, most commonly indicating chronic ischemic microangiopathy. Vascular: No abnormal hyperdensity of the major intracranial arteries or dural venous sinuses. No intracranial atherosclerosis. Skull: The visualized skull base, calvarium and extracranial soft tissues are normal. Sinuses/Orbits: No fluid levels or advanced mucosal thickening of the visualized paranasal sinuses. No mastoid or middle ear effusion. The orbits are normal. ASPECTS [facility] Stroke Program Early CT Score) - Ganglionic level infarction (caudate, lentiform nuclei, internal capsule, insula, M1-M3 cortex): 7 - Supraganglionic infarction (M4-M6 cortex): 3 Total score (0-10 with 10 being normal): 10 IMPRESSION: 1. No intracranial hemorrhage or mass effect. 2. ASPECTS is 10. These results were communicated to Dr. Roland Rack at 5:31 pm on 10/26/2018 by text page via the  [facility] messaging system. Electronically Signed   By: Ulyses Jarred M.D.   On: 10/26/2018 17:31   Vas [name] Lower Extremity Venous (dvt)  Result Date: 10/28/2018  Lower Venous Study Indications: Stroke.  Comparison Study: No prior study Performing Technologist: Sharion Dove RVS  Examination Guidelines: A complete evaluation includes B-mode imaging, spectral Doppler, color Doppler, and power Doppler as needed of all accessible portions of each vessel. Bilateral testing is considered an integral part of a complete examination. Limited examinations for reoccurring indications may be performed as noted.  +---------+---------------+---------+-----------+----------+-------+ RIGHT    CompressibilityPhasicitySpontaneityPropertiesSummary +---------+---------------+---------+-----------+----------+-------+ CFV      Full           Yes      Yes                          +---------+---------------+---------+-----------+----------+-------+ SFJ      Full                                                 +---------+---------------+---------+-----------+----------+-------+ FV Prox  Full                                                 +---------+---------------+---------+-----------+----------+-------+ FV Mid   Full                                                 +---------+---------------+---------+-----------+----------+-------+  FV DistalFull                                                 +---------+---------------+---------+-----------+----------+-------+ PFV      Full                                                 +---------+---------------+---------+-----------+----------+-------+ POP      Full           Yes      Yes                          +---------+---------------+---------+-----------+----------+-------+ PTV      Full                                                 +---------+---------------+---------+-----------+----------+-------+ PERO     Full                                                  +---------+---------------+---------+-----------+----------+-------+   +---------+---------------+---------+-----------+----------+-------+ LEFT     CompressibilityPhasicitySpontaneityPropertiesSummary +---------+---------------+---------+-----------+----------+-------+ CFV      Full           Yes      Yes                          +---------+---------------+---------+-----------+----------+-------+ SFJ      Full                                                 +---------+---------------+---------+-----------+----------+-------+ FV Prox  Full                                                 +---------+---------------+---------+-----------+----------+-------+ FV Mid   Full                                                 +---------+---------------+---------+-----------+----------+-------+ FV DistalFull                                                 +---------+---------------+---------+-----------+----------+-------+ PFV      Full                                                 +---------+---------------+---------+-----------+----------+-------+ POP  Full           Yes      Yes                          +---------+---------------+---------+-----------+----------+-------+ PTV      Full                                                 +---------+---------------+---------+-----------+----------+-------+ PERO     Full                                                 +---------+---------------+---------+-----------+----------+-------+     Summary: Right: There is no evidence of deep vein thrombosis in the lower extremity. No cystic structure found in the popliteal fossa. Left: There is no evidence of deep vein thrombosis in the lower extremity. No cystic structure found in the popliteal fossa.  *See table(s) above for measurements and observations. Electronically signed by Harold Barban MD on 10/28/2018 at 4:33:30 PM.     Final     Microbiology: No results found for this or any previous visit (from the past 240 hour(s)).   Labs: Basic Metabolic Panel: Recent Labs  Lab 11/04/18 1734 11/05/18 0546 11/06/18 0735 11/07/18 0717  NA 134* 132* 128* 131*  K 3.4* 3.3* 3.7 3.9  CL 101 98 93* 100  CO2 22 22 26 25   GLUCOSE 52* 53* 178* 213*  BUN 18 16 10 8   CREATININE 1.08* 1.01* 0.87 0.90  CALCIUM 9.0 8.8* 8.4* 8.6*  MG  --   --  1.3* 1.8   Liver Function Tests: Recent Labs  Lab 11/04/18 1734 11/05/18 0546  AST 32 30  ALT 19 19  ALKPHOS 139* 128*  BILITOT 1.2 1.3*  PROT 6.8 6.5  ALBUMIN 3.4* 3.4*   No results for input(s): LIPASE, AMYLASE in the last 168 hours. No results for input(s): AMMONIA in the last 168 hours. CBC: Recent Labs  Lab 11/04/18 1734 11/05/18 0546 11/06/18 0735 11/07/18 0717  WBC 6.4 6.1 4.4 4.0  NEUTROABS 4.0  --  2.7 2.3  HGB 11.5* 11.2* 11.6* 11.2*  HCT 34.2* 33.3* 33.6* 33.1*  MCV 90.5 89.3 88.2 89.5  PLT 187 151 165 154   Cardiac Enzymes: No results for input(s): CKTOTAL, CKMB, CKMBINDEX, TROPONINI in the last 168 hours. BNP: BNP (last 3 results) No results for input(s): BNP in the last 8760 hours.  ProBNP (last 3 results) No results for input(s): PROBNP in the last 8760 hours.  CBG: Recent Labs  Lab 11/06/18 1622 11/06/18 1940 11/07/18 0734 11/07/18 1033 11/07/18 1650  GLUCAP 148* 192* 201* 222* 167*    Principal Problem:   Hypoglycemia due to insulin Active Problems:   IDDM (insulin dependent diabetes mellitus) (HCC)   Essential hypertension   Hypothyroidism   CAD (coronary artery disease)   Diabetes mellitus with complication ([place])   UTI (urinary tract infection)   Time coordinating discharge: 28 minutes.  Signed:        Shakora Nordquist, DO [facility]  11/08/2018, 7:34 AM

## 2018-11-08 NOTE — Telephone Encounter (Signed)
LINQ transmission received 11/07/18 (from [facility]). 43 "AF" episodes--available ECGs show SR with ectopy and short runs of AT. No true AF noted. Will attach report for MD review to virtual wound check encounter.

## 2018-11-08 NOTE — Progress Notes (Signed)
Pt verbalized consent to virtual visit. Virtual ILR wound. Steri strips removed by daughter. Wound healing well. Will request manual tranmission. Questions answered. Education regarding home monitor provided.

## 2018-11-08 NOTE — Telephone Encounter (Signed)
Follow up ° ° °Patient is returning your call. Please call. ° ° ° °

## 2018-11-08 NOTE — Telephone Encounter (Signed)
Spoke to pt daughter, requested a call at 4pm to help with sending manual transmission.

## 2018-11-08 NOTE — Telephone Encounter (Signed)
LMOM for daughter to call DC to reschedule appt.

## 2018-11-09 NOTE — Progress Notes (Signed)
Manual transmission received 11/09/18 as requested. All available "AF" episodes appear SR w/ectopy, short runs of PAT, no true AF noted. Will attach episodes to PaceArt report for review by Dr. Lovena Le.

## 2018-11-09 NOTE — Telephone Encounter (Signed)
Manual transmission received, all available "AF" ECGs appear SR w/ectopy, not true AF.

## 2018-11-09 NOTE — Telephone Encounter (Signed)
I helped the pt daughter send the transmission. Transmission received 11-08-2018

## 2018-11-10 DIAGNOSIS — I639 Cerebral infarction, unspecified: Secondary | ICD-10-CM | POA: Diagnosis not present

## 2018-11-12 ENCOUNTER — Telehealth: Payer: Self-pay | Admitting: *Deleted

## 2018-11-12 ENCOUNTER — Telehealth: Payer: Self-pay | Admitting: Student

## 2018-11-12 DIAGNOSIS — N390 Urinary tract infection, site not specified: Secondary | ICD-10-CM | POA: Diagnosis not present

## 2018-11-12 DIAGNOSIS — E039 Hypothyroidism, unspecified: Secondary | ICD-10-CM | POA: Diagnosis not present

## 2018-11-12 DIAGNOSIS — N183 Chronic kidney disease, stage 3 (moderate): Secondary | ICD-10-CM | POA: Diagnosis not present

## 2018-11-12 DIAGNOSIS — Z9861 Coronary angioplasty status: Secondary | ICD-10-CM | POA: Diagnosis not present

## 2018-11-12 DIAGNOSIS — I129 Hypertensive chronic kidney disease with stage 1 through stage 4 chronic kidney disease, or unspecified chronic kidney disease: Secondary | ICD-10-CM | POA: Diagnosis not present

## 2018-11-12 DIAGNOSIS — I639 Cerebral infarction, unspecified: Secondary | ICD-10-CM | POA: Diagnosis not present

## 2018-11-12 DIAGNOSIS — E1159 Type 2 diabetes mellitus with other circulatory complications: Secondary | ICD-10-CM | POA: Diagnosis not present

## 2018-11-12 NOTE — Telephone Encounter (Signed)
LVM for daughter, [name] on [place] advising her that her mother's appointment for hospital 4 week FU needs to be rescheduled. We are still going to be closed on Fridays in July due to Covid 19. I advised I can move it sooner in the same week. Left office # for call back.

## 2018-11-12 NOTE — Telephone Encounter (Signed)
Spoke with patient daughter. Will send manual transmission and made office appointment for Thursday at 1000 to adjust settings.    Legrand Como [address] [place], PA-C 11/12/2018 8:28 AM

## 2018-11-14 ENCOUNTER — Telehealth: Payer: Self-pay

## 2018-11-14 DIAGNOSIS — I639 Cerebral infarction, unspecified: Secondary | ICD-10-CM | POA: Diagnosis not present

## 2018-11-14 NOTE — Telephone Encounter (Signed)
Appointment 11-15-2018 at 10 am      COVID-19 Pre-Screening Questions:  . In the past 7 to 10 days have you had a cough,  shortness of breath, headache, congestion, fever (100 or greater) body aches, chills, sore throat, or sudden loss of taste or sense of smell? No . Have you been around anyone with known Covid 19. No . Have you been around anyone who is awaiting Covid 19 test results in the past 7 to 10 days? No . Have you been around anyone who has been exposed to Covid 19, or has mentioned symptoms of Covid 19 within the past 7 to 10 days? No  If you have any concerns/questions about symptoms patients report during screening (either on the phone or at threshold). Contact the provider seeing the patient or DOD for further guidance.  If neither are available contact a member of the leadership team.        Pt daughter answered No to all Covid-19 questions. I let her know the pt will need to wear a mask and come to the appointment alone if she is physically able. I told the daughter that we are limiting the amt of people coming into the office. I also let her know if anything changes between now and her appointment to call and let us know. The pt daughter verbalized understanding.

## 2018-11-15 ENCOUNTER — Other Ambulatory Visit: Payer: Self-pay

## 2018-11-15 ENCOUNTER — Ambulatory Visit (INDEPENDENT_AMBULATORY_CARE_PROVIDER_SITE_OTHER): Payer: Medicare Other | Admitting: *Deleted

## 2018-11-15 DIAGNOSIS — I639 Cerebral infarction, unspecified: Secondary | ICD-10-CM

## 2018-11-15 LAB — CUP PACEART INCLINIC DEVICE CHECK
Date Time Interrogation Session: 20200702110723
Implantable Pulse Generator Implant Date: 20200615

## 2018-11-15 NOTE — Patient Instructions (Signed)
Send remote transmission when you get home.

## 2018-11-15 NOTE — Telephone Encounter (Signed)
Pt returning call please call back, no sooner appts to schedule

## 2018-11-15 NOTE — Progress Notes (Signed)
Loop check in clinic. Battery status: Good. R-waves 0.34 mV. 0 symptom episodes, 0 tachy episodes,  0 pause episodes, 0 brady episodes. 130 AF episodes, all available ECGs  appear SR with PACs and MAT. AF detection programmed : less sensitive  and AT/AF Recording threshold programmed to record episodes >= 10 minutes per GT. Monthly summary reports.

## 2018-11-15 NOTE — Telephone Encounter (Signed)
Returned call to daughter, [name] and we rescheduled her hospital follow up. She  verbalized understanding, appreciation.

## 2018-11-19 DIAGNOSIS — H6123 Impacted cerumen, bilateral: Secondary | ICD-10-CM | POA: Diagnosis not present

## 2018-11-20 DIAGNOSIS — I639 Cerebral infarction, unspecified: Secondary | ICD-10-CM | POA: Diagnosis not present

## 2018-11-21 DIAGNOSIS — I639 Cerebral infarction, unspecified: Secondary | ICD-10-CM | POA: Diagnosis not present

## 2018-11-22 DIAGNOSIS — E1159 Type 2 diabetes mellitus with other circulatory complications: Secondary | ICD-10-CM | POA: Diagnosis not present

## 2018-11-22 DIAGNOSIS — I639 Cerebral infarction, unspecified: Secondary | ICD-10-CM | POA: Diagnosis not present

## 2018-11-26 ENCOUNTER — Telehealth: Payer: Self-pay | Admitting: Diagnostic Neuroimaging

## 2018-11-26 DIAGNOSIS — I639 Cerebral infarction, unspecified: Secondary | ICD-10-CM | POA: Diagnosis not present

## 2018-11-26 NOTE — Telephone Encounter (Signed)
Pt's daughter is calling stating that Dr. [name] advised for the pt to reach out to her Neurologist to discuss the dizzy spells that she is experiencing. Pt's daughter would like a call when available.

## 2018-11-26 NOTE — Telephone Encounter (Signed)
Called dgtr, [name], LVM requesting call back.

## 2018-11-27 DIAGNOSIS — I639 Cerebral infarction, unspecified: Secondary | ICD-10-CM | POA: Diagnosis not present

## 2018-11-28 NOTE — Telephone Encounter (Signed)
LVM informing daughter, [name] that patient has FU per Dr Erlinda Hong, stroke dr in hospital. I advised her the appt was made on 7/2 with her mother. Left number in case she had further questions.

## 2018-11-29 ENCOUNTER — Ambulatory Visit (INDEPENDENT_AMBULATORY_CARE_PROVIDER_SITE_OTHER): Payer: Medicare Other | Admitting: *Deleted

## 2018-11-29 DIAGNOSIS — I639 Cerebral infarction, unspecified: Secondary | ICD-10-CM | POA: Diagnosis not present

## 2018-11-30 ENCOUNTER — Ambulatory Visit: Payer: Self-pay | Admitting: Diagnostic Neuroimaging

## 2018-12-02 DIAGNOSIS — E669 Obesity, unspecified: Secondary | ICD-10-CM | POA: Diagnosis not present

## 2018-12-02 DIAGNOSIS — Z87440 Personal history of urinary (tract) infections: Secondary | ICD-10-CM | POA: Diagnosis not present

## 2018-12-02 DIAGNOSIS — I350 Nonrheumatic aortic (valve) stenosis: Secondary | ICD-10-CM | POA: Diagnosis not present

## 2018-12-02 DIAGNOSIS — E785 Hyperlipidemia, unspecified: Secondary | ICD-10-CM | POA: Diagnosis not present

## 2018-12-02 DIAGNOSIS — Z85828 Personal history of other malignant neoplasm of skin: Secondary | ICD-10-CM | POA: Diagnosis not present

## 2018-12-02 DIAGNOSIS — Z7984 Long term (current) use of oral hypoglycemic drugs: Secondary | ICD-10-CM | POA: Diagnosis not present

## 2018-12-02 DIAGNOSIS — I10 Essential (primary) hypertension: Secondary | ICD-10-CM | POA: Diagnosis not present

## 2018-12-02 DIAGNOSIS — E11649 Type 2 diabetes mellitus with hypoglycemia without coma: Secondary | ICD-10-CM | POA: Diagnosis not present

## 2018-12-02 DIAGNOSIS — Z9181 History of falling: Secondary | ICD-10-CM | POA: Diagnosis not present

## 2018-12-02 DIAGNOSIS — I252 Old myocardial infarction: Secondary | ICD-10-CM | POA: Diagnosis not present

## 2018-12-02 DIAGNOSIS — K219 Gastro-esophageal reflux disease without esophagitis: Secondary | ICD-10-CM | POA: Diagnosis not present

## 2018-12-02 DIAGNOSIS — F0280 Dementia in other diseases classified elsewhere without behavioral disturbance: Secondary | ICD-10-CM | POA: Diagnosis not present

## 2018-12-02 DIAGNOSIS — Z6831 Body mass index (BMI) 31.0-31.9, adult: Secondary | ICD-10-CM | POA: Diagnosis not present

## 2018-12-02 DIAGNOSIS — F0781 Postconcussional syndrome: Secondary | ICD-10-CM | POA: Diagnosis not present

## 2018-12-02 DIAGNOSIS — E039 Hypothyroidism, unspecified: Secondary | ICD-10-CM | POA: Diagnosis not present

## 2018-12-02 DIAGNOSIS — I2510 Atherosclerotic heart disease of native coronary artery without angina pectoris: Secondary | ICD-10-CM | POA: Diagnosis not present

## 2018-12-03 DIAGNOSIS — I10 Essential (primary) hypertension: Secondary | ICD-10-CM | POA: Diagnosis not present

## 2018-12-03 DIAGNOSIS — E11649 Type 2 diabetes mellitus with hypoglycemia without coma: Secondary | ICD-10-CM | POA: Diagnosis not present

## 2018-12-03 DIAGNOSIS — I350 Nonrheumatic aortic (valve) stenosis: Secondary | ICD-10-CM | POA: Diagnosis not present

## 2018-12-03 DIAGNOSIS — I2510 Atherosclerotic heart disease of native coronary artery without angina pectoris: Secondary | ICD-10-CM | POA: Diagnosis not present

## 2018-12-03 DIAGNOSIS — E039 Hypothyroidism, unspecified: Secondary | ICD-10-CM | POA: Diagnosis not present

## 2018-12-03 DIAGNOSIS — F0280 Dementia in other diseases classified elsewhere without behavioral disturbance: Secondary | ICD-10-CM | POA: Diagnosis not present

## 2018-12-03 LAB — CUP PACEART REMOTE DEVICE CHECK
Date Time Interrogation Session: 20200718200930
Implantable Pulse Generator Implant Date: 20200615

## 2018-12-04 DIAGNOSIS — E11649 Type 2 diabetes mellitus with hypoglycemia without coma: Secondary | ICD-10-CM | POA: Diagnosis not present

## 2018-12-04 DIAGNOSIS — I350 Nonrheumatic aortic (valve) stenosis: Secondary | ICD-10-CM | POA: Diagnosis not present

## 2018-12-04 DIAGNOSIS — E039 Hypothyroidism, unspecified: Secondary | ICD-10-CM | POA: Diagnosis not present

## 2018-12-04 DIAGNOSIS — I10 Essential (primary) hypertension: Secondary | ICD-10-CM | POA: Diagnosis not present

## 2018-12-04 DIAGNOSIS — I2510 Atherosclerotic heart disease of native coronary artery without angina pectoris: Secondary | ICD-10-CM | POA: Diagnosis not present

## 2018-12-04 DIAGNOSIS — F0280 Dementia in other diseases classified elsewhere without behavioral disturbance: Secondary | ICD-10-CM | POA: Diagnosis not present

## 2018-12-05 DIAGNOSIS — E11649 Type 2 diabetes mellitus with hypoglycemia without coma: Secondary | ICD-10-CM | POA: Diagnosis not present

## 2018-12-05 DIAGNOSIS — I2510 Atherosclerotic heart disease of native coronary artery without angina pectoris: Secondary | ICD-10-CM | POA: Diagnosis not present

## 2018-12-05 DIAGNOSIS — F0280 Dementia in other diseases classified elsewhere without behavioral disturbance: Secondary | ICD-10-CM | POA: Diagnosis not present

## 2018-12-05 DIAGNOSIS — I350 Nonrheumatic aortic (valve) stenosis: Secondary | ICD-10-CM | POA: Diagnosis not present

## 2018-12-05 DIAGNOSIS — I10 Essential (primary) hypertension: Secondary | ICD-10-CM | POA: Diagnosis not present

## 2018-12-05 DIAGNOSIS — E039 Hypothyroidism, unspecified: Secondary | ICD-10-CM | POA: Diagnosis not present

## 2018-12-06 NOTE — Progress Notes (Signed)
Carelink Summary Report / Loop Recorder 

## 2018-12-10 ENCOUNTER — Ambulatory Visit (INDEPENDENT_AMBULATORY_CARE_PROVIDER_SITE_OTHER): Payer: Medicare Other | Admitting: Diagnostic Neuroimaging

## 2018-12-10 ENCOUNTER — Encounter: Payer: Self-pay | Admitting: Diagnostic Neuroimaging

## 2018-12-10 ENCOUNTER — Other Ambulatory Visit: Payer: Self-pay

## 2018-12-10 VITALS — BP 106/64 | HR 68 | Temp 98.4°F | Ht 66.0 in | Wt 189.0 lb

## 2018-12-10 DIAGNOSIS — R42 Dizziness and giddiness: Secondary | ICD-10-CM | POA: Diagnosis not present

## 2018-12-10 DIAGNOSIS — I639 Cerebral infarction, unspecified: Secondary | ICD-10-CM

## 2018-12-10 DIAGNOSIS — I6930 Unspecified sequelae of cerebral infarction: Secondary | ICD-10-CM | POA: Diagnosis not present

## 2018-12-10 DIAGNOSIS — G459 Transient cerebral ischemic attack, unspecified: Secondary | ICD-10-CM | POA: Diagnosis not present

## 2018-12-10 DIAGNOSIS — F0781 Postconcussional syndrome: Secondary | ICD-10-CM | POA: Diagnosis not present

## 2018-12-10 DIAGNOSIS — Z8673 Personal history of transient ischemic attack (TIA), and cerebral infarction without residual deficits: Secondary | ICD-10-CM
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
## 2018-12-11 ENCOUNTER — Other Ambulatory Visit: Payer: Self-pay

## 2018-12-11 NOTE — Patient Outreach (Signed)
[facility]) Care Management  12/11/2018  HAWRAA STAMBAUGH 09/03/1934 [id number]  Received notification from Darylene Price, [place] on yesterday requesting update as Darylene Price stated RN CM name placed on Care Team 11-07-2018. Darylene Price made aware that RN CM did not receive referral. Darylene Price sent copy of referral.   Called member today at preferred number and left voice mail message. Received incoming call from member's daughter [name] who placed member on phone. Member on phone. Introduced self and explained reason for the call. HIPPA identifiers verified.   Explained that [facility] care coordination services, connection to local community resources and personal assistance in managing member's healthcare needs are provided to member without cost. Additional benefits explained as well.   Member states that she is aware of [facility] through her Provider; however, member declines services at this time.   Member states that she currently has Home Health services to assist with ambulation. She has upcoming therapy scheduled with neurology. Member states that her daughter [name] assists with medications and member denies need for transportation assistance. Member continues to deny any current needs at this time. Member verbalized agreement with sending a Successful Outreach Letter and states that she will call if she has any needs at a later time. Member made aware that RN CM will notify member's PCP and member verbalized agreement.   Plan: Will send Successful Outreach Letter. Will send case closure letter to PCP. RN CM will not open case at this time due to member declined services. No other [facility] Care Team Members involved at this time.  Benjamine Mola "ANN" Josiah Lobo, RN-BSN  [facility] Care Management  Community Care Management Coordinator  [phone] [email or web address]

## 2018-12-12 DIAGNOSIS — F0280 Dementia in other diseases classified elsewhere without behavioral disturbance: Secondary | ICD-10-CM | POA: Diagnosis not present

## 2018-12-12 DIAGNOSIS — E039 Hypothyroidism, unspecified: Secondary | ICD-10-CM | POA: Diagnosis not present

## 2018-12-12 DIAGNOSIS — E11649 Type 2 diabetes mellitus with hypoglycemia without coma: Secondary | ICD-10-CM | POA: Diagnosis not present

## 2018-12-12 DIAGNOSIS — I2510 Atherosclerotic heart disease of native coronary artery without angina pectoris: Secondary | ICD-10-CM | POA: Diagnosis not present

## 2018-12-12 DIAGNOSIS — I350 Nonrheumatic aortic (valve) stenosis: Secondary | ICD-10-CM | POA: Diagnosis not present

## 2018-12-12 DIAGNOSIS — I10 Essential (primary) hypertension: Secondary | ICD-10-CM | POA: Diagnosis not present

## 2018-12-13 ENCOUNTER — Telehealth: Payer: Self-pay | Admitting: Diagnostic Neuroimaging

## 2018-12-13 ENCOUNTER — Other Ambulatory Visit: Payer: Self-pay | Admitting: *Deleted

## 2018-12-13 DIAGNOSIS — I2510 Atherosclerotic heart disease of native coronary artery without angina pectoris: Secondary | ICD-10-CM | POA: Diagnosis not present

## 2018-12-13 DIAGNOSIS — E11649 Type 2 diabetes mellitus with hypoglycemia without coma: Secondary | ICD-10-CM | POA: Diagnosis not present

## 2018-12-13 DIAGNOSIS — R42 Dizziness and giddiness: Secondary | ICD-10-CM

## 2018-12-13 DIAGNOSIS — E039 Hypothyroidism, unspecified: Secondary | ICD-10-CM | POA: Diagnosis not present

## 2018-12-13 DIAGNOSIS — I350 Nonrheumatic aortic (valve) stenosis: Secondary | ICD-10-CM | POA: Diagnosis not present

## 2018-12-13 DIAGNOSIS — F0781 Postconcussional syndrome: Secondary | ICD-10-CM

## 2018-12-13 DIAGNOSIS — I10 Essential (primary) hypertension: Secondary | ICD-10-CM | POA: Diagnosis not present

## 2018-12-13 DIAGNOSIS — F0280 Dementia in other diseases classified elsewhere without behavioral disturbance: Secondary | ICD-10-CM | POA: Diagnosis not present

## 2018-12-13 DIAGNOSIS — G459 Transient cerebral ischemic attack, unspecified: Secondary | ICD-10-CM

## 2018-12-13 NOTE — Telephone Encounter (Signed)
Referral placed, called daughter and advised her. She verbalized understanding, appreciation.

## 2018-12-13 NOTE — Telephone Encounter (Signed)
Pt's daughter Tonya Hunter, on [place] called stating that the rehab center does not have the order in yet and she would like to know when it will be done so that they can call to schedule the pt an appt. Please advise.

## 2018-12-14 ENCOUNTER — Encounter: Payer: Self-pay | Admitting: Rehabilitative and Restorative Service Providers"

## 2018-12-14 ENCOUNTER — Other Ambulatory Visit: Payer: Self-pay

## 2018-12-14 ENCOUNTER — Ambulatory Visit
Payer: Medicare Other | Attending: Diagnostic Neuroimaging | Admitting: Rehabilitative and Restorative Service Providers"

## 2018-12-14 VITALS — BP 116/57 | HR 64

## 2018-12-14 DIAGNOSIS — H8111 Benign paroxysmal vertigo, right ear: Secondary | ICD-10-CM | POA: Insufficient documentation

## 2018-12-14 DIAGNOSIS — R2681 Unsteadiness on feet: Secondary | ICD-10-CM | POA: Insufficient documentation

## 2018-12-14 DIAGNOSIS — M6281 Muscle weakness (generalized): Secondary | ICD-10-CM | POA: Diagnosis not present

## 2018-12-14 DIAGNOSIS — R42 Dizziness and giddiness: Secondary | ICD-10-CM

## 2018-12-14 DIAGNOSIS — R2689 Other abnormalities of gait and mobility: Secondary | ICD-10-CM | POA: Diagnosis not present

## 2018-12-14 DIAGNOSIS — M25552 Pain in left hip: Secondary | ICD-10-CM | POA: Diagnosis not present
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
## 2018-12-14 NOTE — Patient Instructions (Signed)
Access Code: [email or web address]  URL: [email or web address]  Date: 12/14/2018  Prepared by: Rudell Cobb   Exercises Sit to Stand - 10 reps - 1 sets - 2x daily - 7x weekly

## 2018-12-20 ENCOUNTER — Encounter: Payer: Self-pay | Admitting: Rehabilitative and Restorative Service Providers"

## 2018-12-20 ENCOUNTER — Ambulatory Visit
Payer: Medicare Other | Attending: Diagnostic Neuroimaging | Admitting: Rehabilitative and Restorative Service Providers"

## 2018-12-20 ENCOUNTER — Other Ambulatory Visit: Payer: Self-pay

## 2018-12-20 DIAGNOSIS — R2681 Unsteadiness on feet: Secondary | ICD-10-CM | POA: Diagnosis not present

## 2018-12-20 DIAGNOSIS — H8111 Benign paroxysmal vertigo, right ear: Secondary | ICD-10-CM | POA: Diagnosis not present

## 2018-12-20 DIAGNOSIS — M6281 Muscle weakness (generalized): Secondary | ICD-10-CM | POA: Insufficient documentation

## 2018-12-20 DIAGNOSIS — R42 Dizziness and giddiness: Secondary | ICD-10-CM | POA: Insufficient documentation

## 2018-12-20 DIAGNOSIS — R2689 Other abnormalities of gait and mobility: Secondary | ICD-10-CM | POA: Diagnosis not present

## 2018-12-20 DIAGNOSIS — M25552 Pain in left hip: Secondary | ICD-10-CM | POA: Diagnosis not present
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
## 2018-12-24 ENCOUNTER — Telehealth: Payer: Self-pay | Admitting: Nurse Practitioner

## 2018-12-24 NOTE — Telephone Encounter (Signed)
Left detailed message for patient to call back regarding her appointment on 8/17 with Dr. Johnsie Cancel. I advised that he has a virtual clinic on that date and asked her to call back to discuss.

## 2018-12-26 ENCOUNTER — Ambulatory Visit (HOSPITAL_COMMUNITY)
Admission: RE | Admit: 2018-12-26 | Discharge: 2018-12-26 | Disposition: A | Discharge status: Home / Self Care | Payer: Medicare Other | Source: Ambulatory Visit | Attending: Internal Medicine | Admitting: Internal Medicine

## 2018-12-26 ENCOUNTER — Ambulatory Visit: Payer: Medicare Other | Admitting: Rehabilitative and Restorative Service Providers"

## 2018-12-26 ENCOUNTER — Other Ambulatory Visit (HOSPITAL_COMMUNITY): Payer: Self-pay | Admitting: Internal Medicine

## 2018-12-26 ENCOUNTER — Other Ambulatory Visit: Payer: Self-pay

## 2018-12-26 ENCOUNTER — Other Ambulatory Visit: Payer: Self-pay | Admitting: Internal Medicine

## 2018-12-26 ENCOUNTER — Encounter: Payer: Self-pay | Admitting: Rehabilitative and Restorative Service Providers"

## 2018-12-26 VITALS — BP 82/58 | HR 74

## 2018-12-26 DIAGNOSIS — M5127 Other intervertebral disc displacement, lumbosacral region: Secondary | ICD-10-CM | POA: Diagnosis not present

## 2018-12-26 DIAGNOSIS — R600 Localized edema: Secondary | ICD-10-CM | POA: Diagnosis not present

## 2018-12-26 DIAGNOSIS — M21372 Foot drop, left foot: Secondary | ICD-10-CM

## 2018-12-26 DIAGNOSIS — R2681 Unsteadiness on feet: Secondary | ICD-10-CM

## 2018-12-26 DIAGNOSIS — I129 Hypertensive chronic kidney disease with stage 1 through stage 4 chronic kidney disease, or unspecified chronic kidney disease: Secondary | ICD-10-CM | POA: Diagnosis not present

## 2018-12-26 DIAGNOSIS — M6281 Muscle weakness (generalized): Secondary | ICD-10-CM | POA: Diagnosis not present

## 2018-12-26 DIAGNOSIS — R2689 Other abnormalities of gait and mobility: Secondary | ICD-10-CM | POA: Diagnosis not present

## 2018-12-26 DIAGNOSIS — E1159 Type 2 diabetes mellitus with other circulatory complications: Secondary | ICD-10-CM | POA: Diagnosis not present

## 2018-12-26 DIAGNOSIS — M25552 Pain in left hip: Secondary | ICD-10-CM

## 2018-12-26 DIAGNOSIS — H8111 Benign paroxysmal vertigo, right ear: Secondary | ICD-10-CM

## 2018-12-26 DIAGNOSIS — R42 Dizziness and giddiness: Secondary | ICD-10-CM | POA: Diagnosis not present

## 2018-12-26 DIAGNOSIS — N183 Chronic kidney disease, stage 3 (moderate): Secondary | ICD-10-CM | POA: Diagnosis not present
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
## 2018-12-27 NOTE — Telephone Encounter (Signed)
Spoke with pt's daughter who states pt has agreed to do virtual visit on Monday 8/17 with Dr. Johnsie Cancel.   Virtual Visit Pre-Appointment Phone Call  "(Name), I am calling you today to discuss your upcoming appointment. We are currently trying to limit exposure to the virus that causes COVID-19 by seeing patients at home rather than in the office."  1. "What is the BEST phone number to call the day of the visit?" - include this in appointment notes  2. "Do you have or have access to (through a family member/friend) a smartphone with video capability that we can use for your visit?" a. If yes - list this number in appt notes as "cell" (if different from BEST phone #) and list the appointment type as a VIDEO visit in appointment notes b. If no - list the appointment type as a PHONE visit in appointment notes  3. Confirm consent - "In the setting of the current Covid19 crisis, you are scheduled for a (phone or video) visit with your provider on (date) at (time).  Just as we do with many in-office visits, in order for you to participate in this visit, we must obtain consent.  If you'd like, I can send this to your mychart (if signed up) or email for you to review.  Otherwise, I can obtain your verbal consent now.  All virtual visits are billed to your insurance company just like a normal visit would be.  By agreeing to a virtual visit, we'd like you to understand that the technology does not allow for your provider to perform an examination, and thus may limit your provider's ability to fully assess your condition. If your provider identifies any concerns that need to be evaluated in person, we will make arrangements to do so.  Finally, though the technology is pretty good, we cannot assure that it will always work on either your or our end, and in the setting of a video visit, we may have to convert it to a phone-only visit.  In either situation, we cannot ensure that we have a secure connection.  Are you  willing to proceed?" STAFF: Did the patient verbally acknowledge consent to telehealth visit? Document YES/NO here: YES  4. Advise patient to be prepared - "Two hours prior to your appointment, go ahead and check your blood pressure, pulse, oxygen saturation, and your weight (if you have the equipment to check those) and write them all down. When your visit starts, your provider will ask you for this information. If you have an Apple Watch or Kardia device, please plan to have heart rate information ready on the day of your appointment. Please have a pen and paper handy nearby the day of the visit as well."  5. Give patient instructions for MyChart download to smartphone OR Doximity/Doxy.me as below if video visit (depending on what platform provider is using)  6. Inform patient they will receive a phone call 15 minutes prior to their appointment time (may be from unknown caller ID) so they should be prepared to answer    TELEPHONE CALL NOTE  Tonya Hunter has been deemed a candidate for a follow-up tele-health visit to limit community exposure during the Covid-19 pandemic. I spoke with the patient via phone to ensure availability of phone/video source, confirm preferred email & phone number, and discuss instructions and expectations.  I reminded Tonya Hunter to be prepared with any vital sign and/or heart rhythm information that could potentially be obtained via  home monitoring, at the time of her visit. I reminded Tonya Hunter to expect a phone call prior to her visit.  Jacinta Shoe, [place] 12/27/2018 9:57 AM   INSTRUCTIONS FOR DOWNLOADING THE MYCHART APP TO SMARTPHONE  - The patient must first make sure to have activated MyChart and know their login information - If Apple, go to [employer] and type in MyChart in the search bar and download the app. If Android, ask patient to go to [employer] and type in [place] in the search bar and download the app. The app is free but as with any  other app downloads, their phone may require them to verify saved payment information or Apple/Android password.  - The patient will need to then log into the app with their MyChart username and password, and select [facility] as their healthcare provider to link the account. When it is time for your visit, go to the MyChart app, find appointments, and click Begin Video Visit. Be sure to Select Allow for your device to access the Microphone and Camera for your visit. You will then be connected, and your provider will be with you shortly.  **If they have any issues connecting, or need assistance please contact MyChart service desk [phone])**  **If using a computer, in order to ensure the best quality for their visit they will need to use either of the following Internet Browsers: [employer], or Google Chrome**  IF USING DOXIMITY or DOXY.ME - The patient will receive a link just prior to their visit by text.     FULL LENGTH CONSENT FOR TELE-HEALTH VISIT   I hereby voluntarily request, consent and authorize [facility] and its employed or contracted physicians, physician assistants, nurse practitioners or other licensed health care professionals (the Practitioner), to provide me with telemedicine health care services (the "Services") as deemed necessary by the treating Practitioner. I acknowledge and consent to receive the Services by the Practitioner via telemedicine. I understand that the telemedicine visit will involve communicating with the Practitioner through live audiovisual communication technology and the disclosure of certain medical information by electronic transmission. I acknowledge that I have been given the opportunity to request an in-person assessment or other available alternative prior to the telemedicine visit and am voluntarily participating in the telemedicine visit.  I understand that I have the right to withhold or withdraw my consent to the use of  telemedicine in the course of my care at any time, without affecting my right to future care or treatment, and that the Practitioner or I may terminate the telemedicine visit at any time. I understand that I have the right to inspect all information obtained and/or recorded in the course of the telemedicine visit and may receive copies of available information for a reasonable fee.  I understand that some of the potential risks of receiving the Services via telemedicine include:  Marland Kitchen Delay or interruption in medical evaluation due to technological equipment failure or disruption; . Information transmitted may not be sufficient (e.g. poor resolution of images) to allow for appropriate medical decision making by the Practitioner; and/or  . In rare instances, security protocols could fail, causing a breach of personal health information.  Furthermore, I acknowledge that it is my responsibility to provide information about my medical history, conditions and care that is complete and accurate to the best of my ability. I acknowledge that Practitioner's advice, recommendations, and/or decision may be based on factors not within their control, such as  incomplete or inaccurate data provided by me or distortions of diagnostic images or specimens that may result from electronic transmissions. I understand that the practice of medicine is not an exact science and that Practitioner makes no warranties or guarantees regarding treatment outcomes. I acknowledge that I will receive a copy of this consent concurrently upon execution via email to the email address I last provided but may also request a printed copy by calling the office of [facility].    I understand that my insurance will be billed for this visit.   I have read or had this consent read to me. . I understand the contents of this consent, which adequately explains the benefits and risks of the Services being provided via telemedicine.  . I have been  provided ample opportunity to ask questions regarding this consent and the Services and have had my questions answered to my satisfaction. . I give my informed consent for the services to be provided through the use of telemedicine in my medical care  By participating in this telemedicine visit I agree to the above.

## 2018-12-28 ENCOUNTER — Telehealth: Payer: Self-pay

## 2018-12-28 NOTE — Telephone Encounter (Signed)
No DPR on file to go over meds with care giver.

## 2018-12-31 ENCOUNTER — Encounter: Payer: Self-pay | Admitting: Diagnostic Neuroimaging

## 2018-12-31 ENCOUNTER — Ambulatory Visit (INDEPENDENT_AMBULATORY_CARE_PROVIDER_SITE_OTHER): Payer: Medicare Other | Admitting: Diagnostic Neuroimaging

## 2018-12-31 ENCOUNTER — Ambulatory Visit: Payer: Medicare Other | Admitting: Cardiovascular Disease

## 2018-12-31 ENCOUNTER — Ambulatory Visit: Payer: Medicare Other | Admitting: Rehabilitative and Restorative Service Providers"

## 2018-12-31 ENCOUNTER — Other Ambulatory Visit: Payer: Self-pay

## 2018-12-31 ENCOUNTER — Encounter: Payer: Self-pay | Admitting: Cardiovascular Disease

## 2018-12-31 ENCOUNTER — Telehealth (INDEPENDENT_AMBULATORY_CARE_PROVIDER_SITE_OTHER): Payer: Medicare Other | Admitting: Cardiovascular Disease

## 2018-12-31 ENCOUNTER — Telehealth: Payer: Self-pay | Admitting: Diagnostic Neuroimaging

## 2018-12-31 ENCOUNTER — Encounter: Payer: Self-pay | Admitting: Rehabilitative and Restorative Service Providers"

## 2018-12-31 VITALS — BP 128/70 | HR 67

## 2018-12-31 VITALS — BP 119/69 | HR 78 | Temp 98.2°F | Ht 66.0 in | Wt 182.8 lb

## 2018-12-31 VITALS — BP 128/68 | Ht 66.0 in | Wt 179.0 lb

## 2018-12-31 DIAGNOSIS — M25552 Pain in left hip: Secondary | ICD-10-CM | POA: Diagnosis not present

## 2018-12-31 DIAGNOSIS — I255 Ischemic cardiomyopathy: Secondary | ICD-10-CM | POA: Diagnosis not present

## 2018-12-31 DIAGNOSIS — I2510 Atherosclerotic heart disease of native coronary artery without angina pectoris: Secondary | ICD-10-CM | POA: Diagnosis not present

## 2018-12-31 DIAGNOSIS — R2689 Other abnormalities of gait and mobility: Secondary | ICD-10-CM

## 2018-12-31 DIAGNOSIS — R2681 Unsteadiness on feet: Secondary | ICD-10-CM

## 2018-12-31 DIAGNOSIS — M6281 Muscle weakness (generalized): Secondary | ICD-10-CM

## 2018-12-31 DIAGNOSIS — Z7902 Long term (current) use of antithrombotics/antiplatelets: Secondary | ICD-10-CM | POA: Diagnosis not present

## 2018-12-31 DIAGNOSIS — M21372 Foot drop, left foot: Secondary | ICD-10-CM

## 2018-12-31 DIAGNOSIS — Z955 Presence of coronary angioplasty implant and graft: Secondary | ICD-10-CM

## 2018-12-31 DIAGNOSIS — Z7982 Long term (current) use of aspirin: Secondary | ICD-10-CM

## 2018-12-31 DIAGNOSIS — I10 Essential (primary) hypertension: Secondary | ICD-10-CM | POA: Diagnosis not present

## 2018-12-31 DIAGNOSIS — I639 Cerebral infarction, unspecified: Secondary | ICD-10-CM

## 2018-12-31 DIAGNOSIS — E119 Type 2 diabetes mellitus without complications: Secondary | ICD-10-CM

## 2018-12-31 DIAGNOSIS — I350 Nonrheumatic aortic (valve) stenosis: Secondary | ICD-10-CM

## 2018-12-31 DIAGNOSIS — E785 Hyperlipidemia, unspecified: Secondary | ICD-10-CM

## 2018-12-31 DIAGNOSIS — R42 Dizziness and giddiness: Secondary | ICD-10-CM | POA: Diagnosis not present

## 2018-12-31 DIAGNOSIS — Z8673 Personal history of transient ischemic attack (TIA), and cerebral infarction without residual deficits: Secondary | ICD-10-CM | POA: Diagnosis not present

## 2018-12-31 DIAGNOSIS — H8111 Benign paroxysmal vertigo, right ear: Secondary | ICD-10-CM | POA: Diagnosis not present

## 2018-12-31 NOTE — Telephone Encounter (Signed)
Patient was scheduled to be seen at 4 pm today.
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
## 2018-12-31 NOTE — Telephone Encounter (Signed)
Pt called and said that Her Cardiology dr. Geraldine Solar her to see Dr. Leta Baptist  Due to drop foot pt said she has apt in pt/ot at 64 and wants to see if Dr. Leta Baptist can see her after/ can reach pt at [phone]

## 2018-12-31 NOTE — Patient Instructions (Signed)
Access Code: [email or web address]  URL: [email or web address]  Date: 12/31/2018  Prepared by: Rudell Cobb   Exercises Sit to Stand - 10 reps - 1 sets - 2x daily - 7x weekly Seated Gaze Stabilization with Head Rotation - 15 reps - 2 sets - 3x daily - 7x weekly Standing Marching - 10 reps - 1 sets - 2x daily - 7x weekly Sideways Walking - 10 reps - 1 sets - 2x daily - 7x weekly Standing Gastroc Stretch - 3 reps - 1 sets - 20 seconds hold - 1x daily - 7x weekly
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
## 2018-12-31 NOTE — Patient Instructions (Signed)
Your physician recommends that you continue on your current medications as directed. Please refer to the Current Medication list given to you today.   Your physician wants you to follow-up in: [place] will receive a reminder letter in the mail two months in advance. If you don't receive a letter, please call our office to schedule the follow-up appointment.

## 2018-12-31 NOTE — Telephone Encounter (Signed)
Patient in lobby wanting to speak with nurse regarding foot drop. Has a therapy apt at 51

## 2019-01-01 ENCOUNTER — Ambulatory Visit (INDEPENDENT_AMBULATORY_CARE_PROVIDER_SITE_OTHER): Payer: Medicare Other | Admitting: *Deleted

## 2019-01-01 DIAGNOSIS — I639 Cerebral infarction, unspecified: Secondary | ICD-10-CM

## 2019-01-04 ENCOUNTER — Ambulatory Visit: Payer: Medicare Other | Admitting: Rehabilitative and Restorative Service Providers"

## 2019-01-04 ENCOUNTER — Encounter: Payer: Self-pay | Admitting: Rehabilitative and Restorative Service Providers"

## 2019-01-04 ENCOUNTER — Other Ambulatory Visit: Payer: Self-pay

## 2019-01-04 VITALS — BP 104/62

## 2019-01-04 DIAGNOSIS — R42 Dizziness and giddiness: Secondary | ICD-10-CM

## 2019-01-04 DIAGNOSIS — R2689 Other abnormalities of gait and mobility: Secondary | ICD-10-CM

## 2019-01-04 DIAGNOSIS — M6281 Muscle weakness (generalized): Secondary | ICD-10-CM | POA: Diagnosis not present

## 2019-01-04 DIAGNOSIS — R2681 Unsteadiness on feet: Secondary | ICD-10-CM

## 2019-01-04 DIAGNOSIS — M25552 Pain in left hip: Secondary | ICD-10-CM | POA: Diagnosis not present

## 2019-01-04 DIAGNOSIS — H8111 Benign paroxysmal vertigo, right ear: Secondary | ICD-10-CM | POA: Diagnosis not present
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
## 2019-01-06 LAB — CUP PACEART REMOTE DEVICE CHECK
Date Time Interrogation Session: 20200820214101
Implantable Pulse Generator Implant Date: 20200615

## 2019-01-08 ENCOUNTER — Telehealth: Payer: Self-pay | Admitting: Diagnostic Neuroimaging

## 2019-01-08 DIAGNOSIS — M21372 Foot drop, left foot: Secondary | ICD-10-CM

## 2019-01-08 DIAGNOSIS — E1159 Type 2 diabetes mellitus with other circulatory complications: Secondary | ICD-10-CM | POA: Diagnosis not present

## 2019-01-08 NOTE — Telephone Encounter (Signed)
Pt is asking for a call from [place] [facility] to discuss the needed foot brace for her left foot.  Please call

## 2019-01-08 NOTE — Telephone Encounter (Signed)
Pt states she was to get a call back regarding a foot brace she is to get. Please advise.

## 2019-01-08 NOTE — Telephone Encounter (Signed)
Sent community message to [place], [place] who has been treating patient. Asked her to clarify a PT note she made on 01/04/19 referring to AFO.

## 2019-01-08 NOTE — Telephone Encounter (Signed)
Attempted to reach patient, LVM informing her I am waiting on reply from her PT about the foot brace. I will call her back when I have heard from PT.

## 2019-01-09 DIAGNOSIS — N183 Chronic kidney disease, stage 3 (moderate): Secondary | ICD-10-CM | POA: Diagnosis not present

## 2019-01-09 DIAGNOSIS — M21372 Foot drop, left foot: Secondary | ICD-10-CM | POA: Diagnosis not present

## 2019-01-09 DIAGNOSIS — I129 Hypertensive chronic kidney disease with stage 1 through stage 4 chronic kidney disease, or unspecified chronic kidney disease: Secondary | ICD-10-CM | POA: Diagnosis not present

## 2019-01-09 DIAGNOSIS — E119 Type 2 diabetes mellitus without complications: Secondary | ICD-10-CM | POA: Diagnosis not present

## 2019-01-09 NOTE — Addendum Note (Signed)
Addended by: Minna Antis on: 01/09/2019 10:32 AM   Modules accepted: Orders

## 2019-01-10 NOTE — Progress Notes (Signed)
Carelink Summary Report / Loop Recorder 

## 2019-01-11 ENCOUNTER — Telehealth: Payer: Self-pay | Admitting: Rehabilitative and Restorative Service Providers"

## 2019-01-11 ENCOUNTER — Ambulatory Visit: Payer: Medicare Other | Admitting: Rehabilitative and Restorative Service Providers"

## 2019-01-11 ENCOUNTER — Other Ambulatory Visit: Payer: Self-pay

## 2019-01-11 ENCOUNTER — Encounter: Payer: Self-pay | Admitting: Rehabilitative and Restorative Service Providers"

## 2019-01-11 DIAGNOSIS — R42 Dizziness and giddiness: Secondary | ICD-10-CM

## 2019-01-11 DIAGNOSIS — R2689 Other abnormalities of gait and mobility: Secondary | ICD-10-CM | POA: Diagnosis not present

## 2019-01-11 DIAGNOSIS — M6281 Muscle weakness (generalized): Secondary | ICD-10-CM

## 2019-01-11 DIAGNOSIS — R2681 Unsteadiness on feet: Secondary | ICD-10-CM

## 2019-01-11 DIAGNOSIS — F8081 Childhood onset fluency disorder: Secondary | ICD-10-CM

## 2019-01-11 DIAGNOSIS — R4789 Other speech disturbances: Secondary | ICD-10-CM

## 2019-01-11 DIAGNOSIS — M25552 Pain in left hip: Secondary | ICD-10-CM | POA: Diagnosis not present

## 2019-01-11 DIAGNOSIS — H8111 Benign paroxysmal vertigo, right ear: Secondary | ICD-10-CM | POA: Diagnosis not present

## 2019-01-11 NOTE — Telephone Encounter (Signed)
Dr. Tish Frederickson, Dashay Giesler has inquired about speech therapy reporting stuttering and her daughter notes difficulty with f, s, th sounds.   It sounds like episodes of difficulty with speech are intermittent in nature and last for minutes at a time.  I recommended they check blood pressure during these episodes.   Ms. Colquhoun wants ST evaluation.    If you agree, please place an order in [facility] workque in [facility] or fax the order to [phone]. Thank you, Carleah Yablonski, PT
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
## 2019-01-14 NOTE — Telephone Encounter (Signed)
Speech therapy order placed.

## 2019-01-14 NOTE — Addendum Note (Signed)
Addended by: Minna Antis on: 01/14/2019 02:23 PM   Modules accepted: Orders

## 2019-01-15 NOTE — Telephone Encounter (Signed)
Called patient, reached daughter Tonya Hunter who stated Tonya Hunter PT told them last week she had sent order in, and they would get a call about the foot brace. Tonya Hunter said they haven't gotten a call. Tonya Hunter can't remember where Tonya Hunter sent the order, and Tonya Hunter is out of the office today. Tonya Hunter stated she will call [facility] to see if anyone can find where the order was sent. Otherwise she will call tomorrow when Tonya Hunter returns. Tonya Hunter verbalized understanding, appreciation.

## 2019-01-15 NOTE — Telephone Encounter (Signed)
Please call pt when available to discuss the AFO prescription order.

## 2019-01-16 ENCOUNTER — Ambulatory Visit: Payer: Medicare Other | Admitting: Rehabilitative and Restorative Service Providers"

## 2019-01-16 ENCOUNTER — Telehealth: Payer: Self-pay | Admitting: Rehabilitative and Restorative Service Providers"

## 2019-01-16 NOTE — Telephone Encounter (Addendum)
PT returned [name]'s call and discussed that we have the order for the brace.  It was faxed to [facility] last Friday.  I provided their phone # to call and set up an appointment since they have not heard from [facility].   Also, reviewed with the patient's daughter that this will take weeks.  There will be a fitting, then insurance authorization, then delivery of the brace.  She has multiple factors that make this  More challenging including LE swelling, diabetes.   PT recommended the daughter review the process with the patient as she asks about it each visit >6-8 times/visit stating "are you giving me a brace today?"  PT reviewed need for RW to prevent falls over cane use.  Collan Schoenfeld, PT      ----- Message from Kaylyn Lim sent at 01/15/2019 11:14 AM EDT ----- Patient called. Please call her daughter, [name] at work at [phone].  Cannot get her brace yet for her drop foot. She needs it.  Dr. Lonni Fix told her that the ball is in your court right now.

## 2019-01-18 ENCOUNTER — Telehealth: Payer: Self-pay | Admitting: Rehabilitative and Restorative Service Providers"

## 2019-01-18 ENCOUNTER — Other Ambulatory Visit: Payer: Self-pay

## 2019-01-18 ENCOUNTER — Ambulatory Visit
Payer: Medicare Other | Attending: Diagnostic Neuroimaging | Admitting: Rehabilitative and Restorative Service Providers"

## 2019-01-18 DIAGNOSIS — M25552 Pain in left hip: Secondary | ICD-10-CM | POA: Diagnosis not present

## 2019-01-18 DIAGNOSIS — R471 Dysarthria and anarthria: Secondary | ICD-10-CM | POA: Diagnosis not present

## 2019-01-18 DIAGNOSIS — R42 Dizziness and giddiness: Secondary | ICD-10-CM | POA: Diagnosis not present

## 2019-01-18 DIAGNOSIS — R2681 Unsteadiness on feet: Secondary | ICD-10-CM | POA: Insufficient documentation

## 2019-01-18 DIAGNOSIS — M6281 Muscle weakness (generalized): Secondary | ICD-10-CM | POA: Insufficient documentation

## 2019-01-18 DIAGNOSIS — R2689 Other abnormalities of gait and mobility: Secondary | ICD-10-CM | POA: Diagnosis not present

## 2019-01-18 NOTE — Telephone Encounter (Signed)
  We have mutual patient, Tonya Hunter January 14, 2035. She and her daughter noted they see you all of September on Wednesdays and Fridays. I evaluated her for a left AFO. We trialed a SpryStep flex and WalkOn. The [facility] was more appropriate due to the medial strut. The prefab AFO improved foot clearance and her gait pattern. I will be ordering it and hoping to be at the PT appt on the 18th. We scheduled it for 2pm, does that work for you?  From [place], [facility]
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
## 2019-01-18 NOTE — Patient Instructions (Signed)
Access Code: [email or web address]  URL: [email or web address]  Date: 01/18/2019  Prepared by: Rudell Cobb   Exercises Sit to Stand - 10 reps - 1 sets - 2x daily - 7x weekly Standing Marching - 10 reps - 1 sets - 2x daily - 7x weekly Sideways Walking - 10 reps - 1 sets - 2x daily - 7x weekly Standing Gastroc Stretch - 3 reps - 1 sets - 20 seconds hold - 2x daily - 7x weekly Seated Heel Toe Raises - 15 reps - 1 sets - 5 seconds hold - 2x daily - 7x weekly

## 2019-01-23 ENCOUNTER — Ambulatory Visit: Payer: Medicare Other | Admitting: Rehabilitative and Restorative Service Providers"

## 2019-01-23 ENCOUNTER — Other Ambulatory Visit: Payer: Self-pay

## 2019-01-23 ENCOUNTER — Encounter: Payer: Self-pay | Admitting: Rehabilitative and Restorative Service Providers"

## 2019-01-23 DIAGNOSIS — M6281 Muscle weakness (generalized): Secondary | ICD-10-CM | POA: Diagnosis not present

## 2019-01-23 DIAGNOSIS — R42 Dizziness and giddiness: Secondary | ICD-10-CM | POA: Diagnosis not present

## 2019-01-23 DIAGNOSIS — M25552 Pain in left hip: Secondary | ICD-10-CM | POA: Diagnosis not present

## 2019-01-23 DIAGNOSIS — R2681 Unsteadiness on feet: Secondary | ICD-10-CM

## 2019-01-23 DIAGNOSIS — R2689 Other abnormalities of gait and mobility: Secondary | ICD-10-CM

## 2019-01-23 DIAGNOSIS — R471 Dysarthria and anarthria: Secondary | ICD-10-CM | POA: Diagnosis not present
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
## 2019-01-25 ENCOUNTER — Other Ambulatory Visit: Payer: Self-pay

## 2019-01-25 ENCOUNTER — Ambulatory Visit: Payer: Medicare Other | Admitting: Rehabilitative and Restorative Service Providers"

## 2019-01-25 ENCOUNTER — Encounter: Payer: Self-pay | Admitting: Rehabilitative and Restorative Service Providers"

## 2019-01-25 DIAGNOSIS — M25552 Pain in left hip: Secondary | ICD-10-CM | POA: Diagnosis not present

## 2019-01-25 DIAGNOSIS — R2689 Other abnormalities of gait and mobility: Secondary | ICD-10-CM

## 2019-01-25 DIAGNOSIS — R2681 Unsteadiness on feet: Secondary | ICD-10-CM | POA: Diagnosis not present

## 2019-01-25 DIAGNOSIS — M6281 Muscle weakness (generalized): Secondary | ICD-10-CM | POA: Diagnosis not present

## 2019-01-25 DIAGNOSIS — R42 Dizziness and giddiness: Secondary | ICD-10-CM | POA: Diagnosis not present

## 2019-01-25 DIAGNOSIS — R471 Dysarthria and anarthria: Secondary | ICD-10-CM | POA: Diagnosis not present
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
## 2019-01-28 ENCOUNTER — Encounter: Payer: Medicare Other | Admitting: *Deleted

## 2019-01-28 NOTE — Telephone Encounter (Addendum)
Received fax from [facility] re: foot brace order and last office note indicating need for brace. Order competed, signed and faxed with last office note to [facility].

## 2019-01-29 DIAGNOSIS — H43812 Vitreous degeneration, left eye: Secondary | ICD-10-CM | POA: Diagnosis not present

## 2019-01-29 DIAGNOSIS — E113411 Type 2 diabetes mellitus with severe nonproliferative diabetic retinopathy with macular edema, right eye: Secondary | ICD-10-CM | POA: Diagnosis not present

## 2019-01-29 DIAGNOSIS — I6340 Cerebral infarction due to embolism of unspecified cerebral artery: Secondary | ICD-10-CM | POA: Diagnosis not present

## 2019-01-29 DIAGNOSIS — E113412 Type 2 diabetes mellitus with severe nonproliferative diabetic retinopathy with macular edema, left eye: Secondary | ICD-10-CM | POA: Diagnosis not present

## 2019-01-30 ENCOUNTER — Other Ambulatory Visit: Payer: Self-pay

## 2019-01-30 ENCOUNTER — Encounter: Payer: Self-pay | Admitting: Speech Pathology

## 2019-01-30 ENCOUNTER — Encounter: Payer: Self-pay | Admitting: Rehabilitative and Restorative Service Providers"

## 2019-01-30 ENCOUNTER — Ambulatory Visit: Payer: Medicare Other | Admitting: Speech Pathology

## 2019-01-30 ENCOUNTER — Ambulatory Visit: Payer: Medicare Other | Admitting: Rehabilitative and Restorative Service Providers"

## 2019-01-30 DIAGNOSIS — R42 Dizziness and giddiness: Secondary | ICD-10-CM

## 2019-01-30 DIAGNOSIS — R471 Dysarthria and anarthria: Secondary | ICD-10-CM

## 2019-01-30 DIAGNOSIS — R2681 Unsteadiness on feet: Secondary | ICD-10-CM | POA: Diagnosis not present

## 2019-01-30 DIAGNOSIS — R2689 Other abnormalities of gait and mobility: Secondary | ICD-10-CM | POA: Diagnosis not present

## 2019-01-30 DIAGNOSIS — M6281 Muscle weakness (generalized): Secondary | ICD-10-CM

## 2019-01-30 DIAGNOSIS — M25552 Pain in left hip: Secondary | ICD-10-CM | POA: Diagnosis not present

## 2019-01-30 NOTE — Addendum Note (Signed)
Addended by: Cliffton Asters on: 01/30/2019 05:28 PM   Modules accepted: Orders
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
## 2019-01-30 NOTE — Patient Instructions (Signed)
   Speech exercises - do 5x each, x2-3/day SLOW BIG  SAY THE FOLLOWING- make every sound! Red leather, yellow [place] [employer] [place] copper coffee pot Three free throws [place], Blue Bulb Flash Message Six Thick Thistles Stick Double Bubble Gum Cinnamon aluminum linoleum Lovely lemon linament Tying Tape Takes Time A Shifty Salt Shaker   Four floors to cover Unique [place] Knapsack Strap [place]  [place]ty Sailors Sailed the [place] Which Wrist Watches are [place] Wrist Watches

## 2019-02-01 ENCOUNTER — Encounter: Payer: Self-pay | Admitting: Rehabilitative and Restorative Service Providers"

## 2019-02-01 ENCOUNTER — Other Ambulatory Visit: Payer: Self-pay

## 2019-02-01 ENCOUNTER — Ambulatory Visit: Payer: Medicare Other | Admitting: Rehabilitative and Restorative Service Providers"

## 2019-02-01 DIAGNOSIS — R42 Dizziness and giddiness: Secondary | ICD-10-CM | POA: Diagnosis not present

## 2019-02-01 DIAGNOSIS — R2681 Unsteadiness on feet: Secondary | ICD-10-CM

## 2019-02-01 DIAGNOSIS — R471 Dysarthria and anarthria: Secondary | ICD-10-CM | POA: Diagnosis not present

## 2019-02-01 DIAGNOSIS — M25552 Pain in left hip: Secondary | ICD-10-CM | POA: Diagnosis not present

## 2019-02-01 DIAGNOSIS — R2689 Other abnormalities of gait and mobility: Secondary | ICD-10-CM

## 2019-02-01 DIAGNOSIS — M6281 Muscle weakness (generalized): Secondary | ICD-10-CM | POA: Diagnosis not present

## 2019-02-01 NOTE — Patient Outreach (Signed)
Telephone outreach to patient to obtain mRs was successfully completed. mRs= 1.
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
## 2019-02-04 DIAGNOSIS — E113411 Type 2 diabetes mellitus with severe nonproliferative diabetic retinopathy with macular edema, right eye: Secondary | ICD-10-CM | POA: Diagnosis not present

## 2019-02-04 DIAGNOSIS — E113412 Type 2 diabetes mellitus with severe nonproliferative diabetic retinopathy with macular edema, left eye: Secondary | ICD-10-CM | POA: Diagnosis not present

## 2019-02-05 ENCOUNTER — Ambulatory Visit (INDEPENDENT_AMBULATORY_CARE_PROVIDER_SITE_OTHER): Payer: Medicare Other | Admitting: *Deleted

## 2019-02-05 DIAGNOSIS — R2990 Unspecified symptoms and signs involving the nervous system: Secondary | ICD-10-CM | POA: Diagnosis not present

## 2019-02-05 LAB — CUP PACEART REMOTE DEVICE CHECK
Date Time Interrogation Session: 20200922205924
Implantable Pulse Generator Implant Date: 20200615

## 2019-02-06 ENCOUNTER — Encounter: Payer: Self-pay | Admitting: Rehabilitative and Restorative Service Providers"

## 2019-02-06 ENCOUNTER — Ambulatory Visit: Payer: Medicare Other | Admitting: Rehabilitative and Restorative Service Providers"

## 2019-02-06 ENCOUNTER — Other Ambulatory Visit: Payer: Self-pay

## 2019-02-06 DIAGNOSIS — M6281 Muscle weakness (generalized): Secondary | ICD-10-CM

## 2019-02-06 DIAGNOSIS — R42 Dizziness and giddiness: Secondary | ICD-10-CM | POA: Diagnosis not present

## 2019-02-06 DIAGNOSIS — R2689 Other abnormalities of gait and mobility: Secondary | ICD-10-CM | POA: Diagnosis not present

## 2019-02-06 DIAGNOSIS — R2681 Unsteadiness on feet: Secondary | ICD-10-CM

## 2019-02-06 DIAGNOSIS — M25552 Pain in left hip: Secondary | ICD-10-CM | POA: Diagnosis not present

## 2019-02-06 DIAGNOSIS — R471 Dysarthria and anarthria: Secondary | ICD-10-CM | POA: Diagnosis not present
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
## 2019-02-08 ENCOUNTER — Other Ambulatory Visit: Payer: Self-pay

## 2019-02-08 ENCOUNTER — Encounter: Payer: Self-pay | Admitting: Rehabilitative and Restorative Service Providers"

## 2019-02-08 ENCOUNTER — Ambulatory Visit: Payer: Medicare Other | Admitting: Rehabilitative and Restorative Service Providers"

## 2019-02-08 DIAGNOSIS — M6281 Muscle weakness (generalized): Secondary | ICD-10-CM

## 2019-02-08 DIAGNOSIS — R471 Dysarthria and anarthria: Secondary | ICD-10-CM | POA: Diagnosis not present

## 2019-02-08 DIAGNOSIS — R2681 Unsteadiness on feet: Secondary | ICD-10-CM | POA: Diagnosis not present

## 2019-02-08 DIAGNOSIS — R42 Dizziness and giddiness: Secondary | ICD-10-CM | POA: Diagnosis not present

## 2019-02-08 DIAGNOSIS — R2689 Other abnormalities of gait and mobility: Secondary | ICD-10-CM

## 2019-02-08 DIAGNOSIS — M25552 Pain in left hip: Secondary | ICD-10-CM | POA: Diagnosis not present
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
## 2019-02-12 ENCOUNTER — Telehealth: Payer: Self-pay

## 2019-02-12 NOTE — Telephone Encounter (Signed)
Pt daughter states they send a manual transmission because they have a schedule that ask for the manual transmissions and that is why we receives the manuals. I told her I will let them know that and I apologized for the phone call.

## 2019-02-13 ENCOUNTER — Emergency Department (HOSPITAL_COMMUNITY): Payer: Medicare Other

## 2019-02-13 ENCOUNTER — Emergency Department (HOSPITAL_COMMUNITY)
Admission: EM | Admit: 2019-02-13 | Discharge: 2019-02-13 | Disposition: A | Discharge status: Home / Self Care | Payer: Medicare Other | Attending: Emergency Medicine | Admitting: Emergency Medicine

## 2019-02-13 ENCOUNTER — Encounter (HOSPITAL_COMMUNITY): Payer: Self-pay | Admitting: Emergency Medicine

## 2019-02-13 DIAGNOSIS — Z96653 Presence of artificial knee joint, bilateral: Secondary | ICD-10-CM | POA: Insufficient documentation

## 2019-02-13 DIAGNOSIS — S0511XA Contusion of eyeball and orbital tissues, right eye, initial encounter: Secondary | ICD-10-CM | POA: Diagnosis not present

## 2019-02-13 DIAGNOSIS — R42 Dizziness and giddiness: Secondary | ICD-10-CM | POA: Diagnosis not present

## 2019-02-13 DIAGNOSIS — W19XXXA Unspecified fall, initial encounter: Secondary | ICD-10-CM | POA: Diagnosis not present

## 2019-02-13 DIAGNOSIS — S60221A Contusion of right hand, initial encounter: Secondary | ICD-10-CM | POA: Diagnosis not present

## 2019-02-13 DIAGNOSIS — Z8673 Personal history of transient ischemic attack (TIA), and cerebral infarction without residual deficits: Secondary | ICD-10-CM | POA: Diagnosis not present

## 2019-02-13 DIAGNOSIS — E039 Hypothyroidism, unspecified: Secondary | ICD-10-CM | POA: Insufficient documentation

## 2019-02-13 DIAGNOSIS — Z7901 Long term (current) use of anticoagulants: Secondary | ICD-10-CM | POA: Insufficient documentation

## 2019-02-13 DIAGNOSIS — Y999 Unspecified external cause status: Secondary | ICD-10-CM | POA: Insufficient documentation

## 2019-02-13 DIAGNOSIS — S0990XA Unspecified injury of head, initial encounter: Secondary | ICD-10-CM | POA: Diagnosis not present

## 2019-02-13 DIAGNOSIS — S01111A Laceration without foreign body of right eyelid and periocular area, initial encounter: Secondary | ICD-10-CM | POA: Diagnosis not present

## 2019-02-13 DIAGNOSIS — W010XXA Fall on same level from slipping, tripping and stumbling without subsequent striking against object, initial encounter: Secondary | ICD-10-CM | POA: Insufficient documentation

## 2019-02-13 DIAGNOSIS — Y92512 Supermarket, store or market as the place of occurrence of the external cause: Secondary | ICD-10-CM | POA: Diagnosis not present

## 2019-02-13 DIAGNOSIS — R52 Pain, unspecified: Secondary | ICD-10-CM | POA: Diagnosis not present

## 2019-02-13 DIAGNOSIS — Y9389 Activity, other specified: Secondary | ICD-10-CM | POA: Diagnosis not present

## 2019-02-13 DIAGNOSIS — E119 Type 2 diabetes mellitus without complications: Secondary | ICD-10-CM | POA: Insufficient documentation

## 2019-02-13 DIAGNOSIS — S01112A Laceration without foreign body of left eyelid and periocular area, initial encounter: Secondary | ICD-10-CM | POA: Diagnosis not present

## 2019-02-13 DIAGNOSIS — I10 Essential (primary) hypertension: Secondary | ICD-10-CM | POA: Diagnosis not present

## 2019-02-13 DIAGNOSIS — S199XXA Unspecified injury of neck, initial encounter: Secondary | ICD-10-CM | POA: Diagnosis not present

## 2019-02-13 DIAGNOSIS — S0083XA Contusion of other part of head, initial encounter: Secondary | ICD-10-CM

## 2019-02-13 DIAGNOSIS — S0181XA Laceration without foreign body of other part of head, initial encounter: Secondary | ICD-10-CM

## 2019-02-13 LAB — CBG MONITORING, ED: Glucose-Capillary: 134 mg/dL — ABNORMAL HIGH (ref 70–99)

## 2019-02-13 MED ORDER — LIDOCAINE-EPINEPHRINE (PF) 2 %-1:200000 IJ SOLN
INTRAMUSCULAR | Status: AC
  Filled 2019-02-13: qty 20

## 2019-02-13 MED ORDER — ACETAMINOPHEN 500 MG PO TABS
1000.0000 mg | ORAL_TABLET | Freq: Once | ORAL | Status: AC
  Administered 2019-02-13: 1000 mg via ORAL
  Filled 2019-02-13: qty 2

## 2019-02-13 MED ORDER — TETANUS-DIPHTH-ACELL PERTUSSIS 5-2.5-18.5 LF-MCG/0.5 IM SUSP
0.5000 mL | Freq: Once | INTRAMUSCULAR | Status: AC
  Administered 2019-02-13: 0.5 mL via INTRAMUSCULAR
  Filled 2019-02-13: qty 0.5
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
## 2019-02-13 NOTE — ED Notes (Addendum)
Pt c/o slight headache. Provider notified, verbal order for 1g Tylenol

## 2019-02-13 NOTE — Progress Notes (Signed)
Carelink Summary Report / Loop Recorder 
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
## 2019-02-14 DIAGNOSIS — E113412 Type 2 diabetes mellitus with severe nonproliferative diabetic retinopathy with macular edema, left eye: Secondary | ICD-10-CM | POA: Diagnosis not present

## 2019-02-19 DIAGNOSIS — Z4802 Encounter for removal of sutures: Secondary | ICD-10-CM | POA: Diagnosis not present

## 2019-02-19 DIAGNOSIS — W19XXXA Unspecified fall, initial encounter: Secondary | ICD-10-CM | POA: Diagnosis not present

## 2019-02-19 DIAGNOSIS — S01119A Laceration without foreign body of unspecified eyelid and periocular area, initial encounter: Secondary | ICD-10-CM | POA: Diagnosis not present

## 2019-02-21 ENCOUNTER — Ambulatory Visit
Payer: Medicare Other | Attending: Diagnostic Neuroimaging | Admitting: Rehabilitative and Restorative Service Providers"

## 2019-02-21 ENCOUNTER — Other Ambulatory Visit: Payer: Self-pay

## 2019-02-21 DIAGNOSIS — R42 Dizziness and giddiness: Secondary | ICD-10-CM

## 2019-02-21 DIAGNOSIS — R471 Dysarthria and anarthria: Secondary | ICD-10-CM | POA: Diagnosis not present

## 2019-02-21 DIAGNOSIS — R2681 Unsteadiness on feet: Secondary | ICD-10-CM

## 2019-02-21 DIAGNOSIS — M6281 Muscle weakness (generalized): Secondary | ICD-10-CM

## 2019-02-21 DIAGNOSIS — M25552 Pain in left hip: Secondary | ICD-10-CM

## 2019-02-21 DIAGNOSIS — R2689 Other abnormalities of gait and mobility: Secondary | ICD-10-CM
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
## 2019-02-25 ENCOUNTER — Ambulatory Visit: Payer: Medicare Other | Admitting: Speech Pathology

## 2019-02-25 ENCOUNTER — Other Ambulatory Visit: Payer: Self-pay

## 2019-02-25 ENCOUNTER — Encounter: Payer: Self-pay | Admitting: Speech Pathology

## 2019-02-25 ENCOUNTER — Encounter: Payer: Self-pay | Admitting: Rehabilitative and Restorative Service Providers"

## 2019-02-25 ENCOUNTER — Ambulatory Visit: Payer: Medicare Other | Admitting: Rehabilitative and Restorative Service Providers"

## 2019-02-25 VITALS — BP 102/62 | HR 67

## 2019-02-25 DIAGNOSIS — M6281 Muscle weakness (generalized): Secondary | ICD-10-CM | POA: Diagnosis not present

## 2019-02-25 DIAGNOSIS — R2681 Unsteadiness on feet: Secondary | ICD-10-CM | POA: Diagnosis not present

## 2019-02-25 DIAGNOSIS — R42 Dizziness and giddiness: Secondary | ICD-10-CM | POA: Diagnosis not present

## 2019-02-25 DIAGNOSIS — R471 Dysarthria and anarthria: Secondary | ICD-10-CM

## 2019-02-25 DIAGNOSIS — R2689 Other abnormalities of gait and mobility: Secondary | ICD-10-CM

## 2019-02-25 DIAGNOSIS — M25552 Pain in left hip: Secondary | ICD-10-CM | POA: Diagnosis not present

## 2019-02-25 NOTE — Patient Instructions (Signed)
Access Code: [email or web address]  URL: [email or web address]  Date: 02/25/2019  Prepared by: Rudell Cobb   Exercises Sit to Stand - 10 reps - 1 sets - 2x daily - 7x weekly Standing Marching - 10 reps - 1 sets - 2x daily - 7x weekly Sideways Walking - 10 reps - 1 sets - 2x daily - 7x weekly Standing Gastroc Stretch - 3 reps - 1 sets - 20 seconds hold - 2x daily - 7x weekly Seated Heel Toe Raises - 15 reps - 1 sets - 5 seconds hold - 2x daily - 7x weekly Standing Gaze Stabilization with Head Rotation - 1 reps - 1 sets - 2x daily - 7x weekly
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
## 2019-02-27 ENCOUNTER — Other Ambulatory Visit: Payer: Self-pay

## 2019-02-27 ENCOUNTER — Ambulatory Visit: Payer: Medicare Other | Admitting: Rehabilitative and Restorative Service Providers"

## 2019-02-27 ENCOUNTER — Encounter: Payer: Self-pay | Admitting: Speech Pathology

## 2019-02-27 ENCOUNTER — Ambulatory Visit: Payer: Medicare Other | Admitting: Speech Pathology

## 2019-02-27 ENCOUNTER — Encounter: Payer: Self-pay | Admitting: Rehabilitative and Restorative Service Providers"

## 2019-02-27 DIAGNOSIS — R2681 Unsteadiness on feet: Secondary | ICD-10-CM | POA: Diagnosis not present

## 2019-02-27 DIAGNOSIS — R471 Dysarthria and anarthria: Secondary | ICD-10-CM | POA: Diagnosis not present

## 2019-02-27 DIAGNOSIS — M25552 Pain in left hip: Secondary | ICD-10-CM | POA: Diagnosis not present

## 2019-02-27 DIAGNOSIS — R42 Dizziness and giddiness: Secondary | ICD-10-CM | POA: Diagnosis not present

## 2019-02-27 DIAGNOSIS — R2689 Other abnormalities of gait and mobility: Secondary | ICD-10-CM

## 2019-02-27 DIAGNOSIS — M6281 Muscle weakness (generalized): Secondary | ICD-10-CM | POA: Diagnosis not present
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
## 2019-03-04 ENCOUNTER — Encounter: Payer: Medicare Other | Admitting: Speech Pathology

## 2019-03-06 ENCOUNTER — Ambulatory Visit: Payer: Medicare Other

## 2019-03-06 ENCOUNTER — Encounter: Payer: Medicare Other | Admitting: Speech Pathology

## 2019-03-11 ENCOUNTER — Ambulatory Visit (INDEPENDENT_AMBULATORY_CARE_PROVIDER_SITE_OTHER): Payer: Medicare Other | Admitting: *Deleted

## 2019-03-11 DIAGNOSIS — I639 Cerebral infarction, unspecified: Secondary | ICD-10-CM | POA: Diagnosis not present

## 2019-03-12 ENCOUNTER — Other Ambulatory Visit: Payer: Self-pay | Admitting: Cardiovascular Disease

## 2019-03-12 LAB — CUP PACEART REMOTE DEVICE CHECK
Date Time Interrogation Session: 20201025220824
Implantable Pulse Generator Implant Date: 20200615

## 2019-03-12 MED ORDER — METOPROLOL SUCCINATE ER 50 MG PO TB24: 50.0000 mg | ORAL_TABLET | Freq: Every day | ORAL | 9 refills | Status: DC

## 2019-03-12 NOTE — Telephone Encounter (Signed)
Pt's medication was sent to pt's pharmacy as requested. Confirmation received.  °

## 2019-03-15 DIAGNOSIS — R5383 Other fatigue: Secondary | ICD-10-CM | POA: Diagnosis not present

## 2019-03-15 DIAGNOSIS — E538 Deficiency of other specified B group vitamins: Secondary | ICD-10-CM | POA: Diagnosis not present

## 2019-03-15 DIAGNOSIS — E038 Other specified hypothyroidism: Secondary | ICD-10-CM | POA: Diagnosis not present

## 2019-03-15 DIAGNOSIS — I129 Hypertensive chronic kidney disease with stage 1 through stage 4 chronic kidney disease, or unspecified chronic kidney disease: Secondary | ICD-10-CM | POA: Diagnosis not present

## 2019-03-15 DIAGNOSIS — M21372 Foot drop, left foot: Secondary | ICD-10-CM | POA: Diagnosis not present

## 2019-03-15 DIAGNOSIS — G4700 Insomnia, unspecified: Secondary | ICD-10-CM | POA: Diagnosis not present

## 2019-03-15 DIAGNOSIS — R531 Weakness: Secondary | ICD-10-CM | POA: Diagnosis not present

## 2019-03-15 DIAGNOSIS — R296 Repeated falls: Secondary | ICD-10-CM | POA: Diagnosis not present

## 2019-03-18 ENCOUNTER — Ambulatory Visit: Payer: Medicare Other

## 2019-03-18 DIAGNOSIS — H43812 Vitreous degeneration, left eye: Secondary | ICD-10-CM | POA: Diagnosis not present

## 2019-03-18 DIAGNOSIS — H353131 Nonexudative age-related macular degeneration, bilateral, early dry stage: Secondary | ICD-10-CM | POA: Diagnosis not present

## 2019-03-18 DIAGNOSIS — E113411 Type 2 diabetes mellitus with severe nonproliferative diabetic retinopathy with macular edema, right eye: Secondary | ICD-10-CM | POA: Diagnosis not present

## 2019-03-18 DIAGNOSIS — E113412 Type 2 diabetes mellitus with severe nonproliferative diabetic retinopathy with macular edema, left eye: Secondary | ICD-10-CM | POA: Diagnosis not present

## 2019-03-20 ENCOUNTER — Ambulatory Visit: Payer: Medicare Other

## 2019-03-25 DIAGNOSIS — E039 Hypothyroidism, unspecified: Secondary | ICD-10-CM | POA: Diagnosis not present

## 2019-03-25 DIAGNOSIS — E782 Mixed hyperlipidemia: Secondary | ICD-10-CM | POA: Diagnosis not present

## 2019-03-25 DIAGNOSIS — Z9861 Coronary angioplasty status: Secondary | ICD-10-CM | POA: Diagnosis not present

## 2019-03-25 DIAGNOSIS — I129 Hypertensive chronic kidney disease with stage 1 through stage 4 chronic kidney disease, or unspecified chronic kidney disease: Secondary | ICD-10-CM | POA: Diagnosis not present

## 2019-03-25 DIAGNOSIS — I639 Cerebral infarction, unspecified: Secondary | ICD-10-CM | POA: Diagnosis not present

## 2019-03-25 DIAGNOSIS — E1159 Type 2 diabetes mellitus with other circulatory complications: Secondary | ICD-10-CM | POA: Diagnosis not present

## 2019-03-25 DIAGNOSIS — N183 Chronic kidney disease, stage 3 unspecified: Secondary | ICD-10-CM | POA: Diagnosis not present

## 2019-03-28 DIAGNOSIS — M21372 Foot drop, left foot: Secondary | ICD-10-CM | POA: Diagnosis not present

## 2019-03-29 NOTE — Progress Notes (Signed)
Carelink Summary Report / Loop Recorder 

## 2019-04-01 DIAGNOSIS — E113392 Type 2 diabetes mellitus with moderate nonproliferative diabetic retinopathy without macular edema, left eye: Secondary | ICD-10-CM | POA: Diagnosis not present

## 2019-04-01 DIAGNOSIS — H5203 Hypermetropia, bilateral: Secondary | ICD-10-CM | POA: Diagnosis not present

## 2019-04-01 DIAGNOSIS — H04123 Dry eye syndrome of bilateral lacrimal glands: Secondary | ICD-10-CM | POA: Diagnosis not present

## 2019-04-01 DIAGNOSIS — H00012 Hordeolum externum right lower eyelid: Secondary | ICD-10-CM | POA: Diagnosis not present

## 2019-04-02 DIAGNOSIS — N182 Chronic kidney disease, stage 2 (mild): Secondary | ICD-10-CM | POA: Diagnosis not present

## 2019-04-02 DIAGNOSIS — L03115 Cellulitis of right lower limb: Secondary | ICD-10-CM | POA: Diagnosis not present

## 2019-04-02 DIAGNOSIS — M79604 Pain in right leg: Secondary | ICD-10-CM | POA: Diagnosis not present

## 2019-04-02 DIAGNOSIS — I129 Hypertensive chronic kidney disease with stage 1 through stage 4 chronic kidney disease, or unspecified chronic kidney disease: Secondary | ICD-10-CM | POA: Diagnosis not present

## 2019-04-02 DIAGNOSIS — L089 Local infection of the skin and subcutaneous tissue, unspecified: Secondary | ICD-10-CM | POA: Diagnosis not present

## 2019-04-14 LAB — CUP PACEART REMOTE DEVICE CHECK
Date Time Interrogation Session: 20201127170919
Implantable Pulse Generator Implant Date: 20200615

## 2019-04-15 ENCOUNTER — Ambulatory Visit (INDEPENDENT_AMBULATORY_CARE_PROVIDER_SITE_OTHER): Payer: Medicare Other | Admitting: *Deleted

## 2019-04-15 DIAGNOSIS — R2990 Unspecified symptoms and signs involving the nervous system: Secondary | ICD-10-CM | POA: Diagnosis not present

## 2019-04-22 DIAGNOSIS — G5732 Lesion of lateral popliteal nerve, left lower limb: Secondary | ICD-10-CM | POA: Diagnosis not present

## 2019-04-24 ENCOUNTER — Encounter: Payer: Self-pay | Admitting: Physical Therapy

## 2019-04-24 ENCOUNTER — Other Ambulatory Visit: Payer: Self-pay

## 2019-04-24 ENCOUNTER — Ambulatory Visit: Payer: Medicare Other | Attending: Diagnostic Neuroimaging | Admitting: Physical Therapy

## 2019-04-24 DIAGNOSIS — R2681 Unsteadiness on feet: Secondary | ICD-10-CM | POA: Insufficient documentation

## 2019-04-24 DIAGNOSIS — R262 Difficulty in walking, not elsewhere classified: Secondary | ICD-10-CM | POA: Insufficient documentation

## 2019-04-24 DIAGNOSIS — R2689 Other abnormalities of gait and mobility: Secondary | ICD-10-CM | POA: Insufficient documentation

## 2019-04-24 DIAGNOSIS — M6281 Muscle weakness (generalized): Secondary | ICD-10-CM | POA: Insufficient documentation

## 2019-04-25 ENCOUNTER — Telehealth: Payer: Self-pay

## 2019-04-25 ENCOUNTER — Telehealth: Payer: Self-pay | Admitting: Cardiovascular Disease

## 2019-04-25 ENCOUNTER — Encounter: Payer: Self-pay | Admitting: Physical Therapy

## 2019-04-25 NOTE — Telephone Encounter (Signed)
Error

## 2019-04-25 NOTE — Telephone Encounter (Signed)
Pt wanted to know what to do for a device check. I told her to sleep by her monitor.
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
## 2019-04-26 ENCOUNTER — Encounter: Payer: Self-pay | Admitting: Physical Therapy

## 2019-04-26 ENCOUNTER — Ambulatory Visit: Payer: Medicare Other | Admitting: Physical Therapy

## 2019-04-26 ENCOUNTER — Other Ambulatory Visit: Payer: Self-pay

## 2019-04-26 DIAGNOSIS — R262 Difficulty in walking, not elsewhere classified: Secondary | ICD-10-CM

## 2019-04-26 DIAGNOSIS — M6281 Muscle weakness (generalized): Secondary | ICD-10-CM | POA: Diagnosis not present

## 2019-04-26 DIAGNOSIS — R2681 Unsteadiness on feet: Secondary | ICD-10-CM

## 2019-04-26 DIAGNOSIS — R2689 Other abnormalities of gait and mobility: Secondary | ICD-10-CM
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
## 2019-04-30 ENCOUNTER — Telehealth: Payer: Self-pay | Admitting: *Deleted

## 2019-04-30 ENCOUNTER — Other Ambulatory Visit: Payer: Self-pay | Admitting: Neurological Surgery

## 2019-04-30 ENCOUNTER — Other Ambulatory Visit: Payer: Self-pay

## 2019-04-30 ENCOUNTER — Encounter: Payer: Self-pay | Admitting: Physical Therapy

## 2019-04-30 ENCOUNTER — Ambulatory Visit: Payer: Medicare Other | Admitting: Physical Therapy

## 2019-04-30 DIAGNOSIS — R2681 Unsteadiness on feet: Secondary | ICD-10-CM

## 2019-04-30 DIAGNOSIS — M6281 Muscle weakness (generalized): Secondary | ICD-10-CM

## 2019-04-30 DIAGNOSIS — G5732 Lesion of lateral popliteal nerve, left lower limb: Secondary | ICD-10-CM | POA: Diagnosis not present

## 2019-04-30 DIAGNOSIS — R2689 Other abnormalities of gait and mobility: Secondary | ICD-10-CM

## 2019-04-30 DIAGNOSIS — R262 Difficulty in walking, not elsewhere classified: Secondary | ICD-10-CM

## 2019-04-30 NOTE — Telephone Encounter (Signed)
Ok to hold plavix 5 days before procedure
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
## 2019-04-30 NOTE — Telephone Encounter (Signed)
Dr. Johnsie Cancel  Can you please weigh in on this patient holding her ASA and Plavix therapy prior to left peroneal nerve decompression 05/13/2019 with general anesthesia?   She has a hx of CAD with DES to LAD 2007 with ISR re intervention in 2011.  Also with a hx of HTN, DM and HLD. Has had stent thrombosis off DAT. GI bleed 2018 banding of varices 07/07/16. Palpitations with PAC's on monitor 2018 Rx beta blockers. Last cath 09/15/16 patent mid LAD stent occluded sub branch of OM Rx medically. EF has been 40-45%. TIA May 2018 and more recent stroke June 2020 and admission with hypoglycemia  You last saw her 12/2018. She had new foot drop at that time and needed follow up with neurology. No new cardiac complaints.   Please send your recommendations to the pre-op pool  Thank you  Sharee Pimple

## 2019-04-30 NOTE — Telephone Encounter (Signed)
    [facility] Pre-operative Risk Assessment    Request for surgical clearance:  1. What type of surgery is being performed? LEFT PERONEAL NERVE DECOMPRESSION   2. When is this surgery scheduled? 05/13/19   3. What type of clearance is required (medical clearance vs. Pharmacy clearance to hold med vs. Both)? MEDICAL  4. Are there any medications that need to be held prior to surgery and how long? ASA AND PLAVIX   5. Practice name and name of physician performing surgery? [facility]; DR. Sherley Bounds   6. What is your office phone number [phone]    7.   What is your office fax number [phone]  8.   Anesthesia type (None, local, MAC, general) ? GENERAL   Julaine Hua 04/30/2019, 11:29 AM  _________________________________________________________________   (provider comments below)

## 2019-05-01 ENCOUNTER — Telehealth: Payer: Self-pay | Admitting: Cardiology

## 2019-05-01 ENCOUNTER — Other Ambulatory Visit: Payer: Self-pay | Admitting: Gastroenterology

## 2019-05-01 DIAGNOSIS — K7469 Other cirrhosis of liver: Secondary | ICD-10-CM

## 2019-05-01 NOTE — Telephone Encounter (Signed)
Needs call

## 2019-05-01 NOTE — Telephone Encounter (Signed)
   Primary Cardiologist: [name], MD  Chart reviewed as part of pre-operative protocol coverage. Given past medical history and time since last visit, based on ACC/AHA guidelines, Tonya Hunter would be at acceptable risk for the planned procedure without further cardiovascular testing. Called to speak with Tonya Hunter today and she is doing well from a cardiac standpoint with no CV symptoms.   Per Dr. Johnsie Cancel, she would be at acceptable risk to hold Plavix for 5 days prior to procedure. May need to additionally reach to the patients neurology team given recent stroke off DAPT. Would continue ASA if at all possible.   I will route this recommendation to the requesting party via Epic fax function and remove from pre-op pool.  Please call with questions.  Kathyrn Drown, NP 05/01/2019, 8:42 AM

## 2019-05-03 ENCOUNTER — Telehealth: Payer: Self-pay | Admitting: Physical Therapy

## 2019-05-03 ENCOUNTER — Ambulatory Visit: Payer: Medicare Other | Admitting: Physical Therapy

## 2019-05-03 NOTE — Telephone Encounter (Signed)
Spoke with pt daughter. She reports pt had a massive HA and could not make it to PT today. Advised of attendance policy and she verbalized understanding. Will be at her next appointment.  Trace Cederberg C. Simmie Garin PT, DPT 05/03/19 1:35 PM

## 2019-05-03 NOTE — Pre-Procedure Instructions (Addendum)
[facility] [place], [address] [place] [address] Phone: [phone] Fax: [phone]      Your procedure is scheduled on Monday, December 28th.  Report to [facility] Main Entrance "A" at 5:30 A.M., and check in at the Admitting office.  Call this number if you have problems the morning of surgery:  [phone]  Call [phone] if you have any questions prior to your surgery date Monday-Friday 8am-4pm    Remember:  Do not eat or drink after midnight the night before your surgery    Take these medicines the morning of surgery with A SIP OF WATER  levothyroxine (SYNTHROID, LEVOTHROID)  metoprolol succinate (TOPROL-XL)  acetaminophen (TYLENOL) -as needed nitroGLYCERIN (NITROSTAT)-as needed. Please let nurse know if you had to use this.   Per your cardiologist, hold clopidogrel (PLAVIX) for 5 days prior to your surgery. Last dose should be on 05/07/19.   Follow your surgeon's instructions on when to stop Aspirin.  If no instructions were given by your surgeon then you will need to call the office to get those instructions.    7 days prior to surgery STOP taking any Aleve, Naproxen, Ibuprofen, Motrin, Advil, Goody's, BC's, all herbal medications, fish oil, and all vitamins.   WHAT DO I DO ABOUT MY DIABETES MEDICATION?   Marland Kitchen Do not take oral diabetes medicines (metFORMIN (GLUCOPHAGE) ) the morning of surgery.   HOW TO MANAGE YOUR DIABETES BEFORE AND AFTER SURGERY  Why is it important to control my blood sugar before and after surgery? . Improving blood sugar levels before and after surgery helps healing and can limit problems. . A way of improving blood sugar control is eating a healthy diet by: o  Eating less sugar and carbohydrates o  Increasing activity/exercise o  Talking with your doctor about reaching your blood sugar goals . High blood sugars (greater than 180 mg/dL) can  raise your risk of infections and slow your recovery, so you will need to focus on controlling your diabetes during the weeks before surgery. . Make sure that the doctor who takes care of your diabetes knows about your planned surgery including the date and location.  How do I manage my blood sugar before surgery? . Check your blood sugar at least 4 times a day, starting 2 days before surgery, to make sure that the level is not too high or low. . Check your blood sugar the morning of your surgery when you wake up and every 2 hours until you get to the Short Stay unit. o If your blood sugar is less than 70 mg/dL, you will need to treat for low blood sugar: - Do not take insulin. - Treat a low blood sugar (less than 70 mg/dL) with  cup of clear juice (cranberry or apple), 4 glucose tablets, OR glucose gel. - Recheck blood sugar in 15 minutes after treatment (to make sure it is greater than 70 mg/dL). If your blood sugar is not greater than 70 mg/dL on recheck, call [phone] for further instructions. . Report your blood sugar to the short stay nurse when you get to Short Stay.  . If you are admitted to the hospital after surgery: o Your blood sugar will be checked by the staff and you will probably be given insulin after surgery (instead of oral diabetes medicines) to make sure you have good blood sugar levels. o The goal for blood sugar  control after surgery is 80-180 mg/dL.    The Morning of Surgery  Do not wear jewelry, make-up or nail polish.  Do not wear lotions, powders, or perfumes, or deodorant  Do not shave 48 hours prior to surgery.    Do not bring valuables to the hospital.  [facility] is not responsible for any belongings or valuables.  If you are a smoker, DO NOT Smoke 24 hours prior to surgery  If you wear a CPAP at night please bring your mask, tubing, and machine the morning of surgery   Remember that you must have someone to transport you home after your surgery, and  remain with you for 24 hours if you are discharged the same day.   Please bring cases for contacts, glasses, hearing aids, dentures or bridgework because it cannot be worn into surgery.    Leave your suitcase in the car.  After surgery it may be brought to your room.  For patients admitted to the hospital, discharge time will be determined by your treatment team.  Patients discharged the day of surgery will not be allowed to drive home.    Special instructions:   - Preparing For Surgery  Before surgery, you can play an important role. Because skin is not sterile, your skin needs to be as free of germs as possible. You can reduce the number of germs on your skin by washing with CHG (chlorahexidine gluconate) Soap before surgery.  CHG is an antiseptic cleaner which kills germs and bonds with the skin to continue killing germs even after washing.    Oral Hygiene is also important to reduce your risk of infection.  Remember - BRUSH YOUR TEETH THE MORNING OF SURGERY WITH YOUR REGULAR TOOTHPASTE  Please do not use if you have an allergy to CHG or antibacterial soaps. If your skin becomes reddened/irritated stop using the CHG.  Do not shave (including legs and underarms) for at least 48 hours prior to first CHG shower. It is OK to shave your face.  Please follow these instructions carefully.   1. Shower the NIGHT BEFORE SURGERY and the MORNING OF SURGERY with CHG Soap.   2. If you chose to wash your hair, wash your hair first as usual with your normal shampoo.  3. After you shampoo, rinse your hair and body thoroughly to remove the shampoo.  4. Use CHG as you would any other liquid soap. You can apply CHG directly to the skin and wash gently with a scrungie or a clean washcloth.   5. Apply the CHG Soap to your body ONLY FROM THE NECK DOWN.  Do not use on open wounds or open sores. Avoid contact with your eyes, ears, mouth and genitals (private parts). Wash Face and genitals  (private parts)  with your normal soap.   6. Wash thoroughly, paying special attention to the area where your surgery will be performed.  7. Thoroughly rinse your body with warm water from the neck down.  8. DO NOT shower/wash with your normal soap after using and rinsing off the CHG Soap.  9. Pat yourself dry with a CLEAN TOWEL.  10. Wear CLEAN PAJAMAS to bed the night before surgery, wear comfortable clothes the morning of surgery  11. Place CLEAN SHEETS on your bed the night of your first shower and DO NOT SLEEP WITH PETS.    Day of Surgery:  Please shower the morning of surgery with the CHG soap Do not apply any deodorants/lotions. Please wear  clean clothes to the hospital/surgery center.   Remember to brush your teeth WITH YOUR REGULAR TOOTHPASTE.   Please read over the following fact sheets that you were given.

## 2019-05-06 ENCOUNTER — Encounter (HOSPITAL_COMMUNITY): Payer: Self-pay

## 2019-05-06 ENCOUNTER — Encounter (HOSPITAL_COMMUNITY)
Admission: RE | Admit: 2019-05-06 | Discharge: 2019-05-06 | Disposition: A | Discharge status: Home / Self Care | Payer: Medicare Other | Source: Ambulatory Visit | Attending: Neurological Surgery | Admitting: Neurological Surgery

## 2019-05-06 ENCOUNTER — Other Ambulatory Visit: Payer: Self-pay

## 2019-05-06 DIAGNOSIS — E039 Hypothyroidism, unspecified: Secondary | ICD-10-CM | POA: Diagnosis not present

## 2019-05-06 DIAGNOSIS — Z85828 Personal history of other malignant neoplasm of skin: Secondary | ICD-10-CM | POA: Diagnosis not present

## 2019-05-06 DIAGNOSIS — I2510 Atherosclerotic heart disease of native coronary artery without angina pectoris: Secondary | ICD-10-CM | POA: Insufficient documentation

## 2019-05-06 DIAGNOSIS — Z8673 Personal history of transient ischemic attack (TIA), and cerebral infarction without residual deficits: Secondary | ICD-10-CM | POA: Insufficient documentation

## 2019-05-06 DIAGNOSIS — E785 Hyperlipidemia, unspecified: Secondary | ICD-10-CM | POA: Insufficient documentation

## 2019-05-06 DIAGNOSIS — Z01818 Encounter for other preprocedural examination: Secondary | ICD-10-CM | POA: Insufficient documentation

## 2019-05-06 DIAGNOSIS — I252 Old myocardial infarction: Secondary | ICD-10-CM | POA: Insufficient documentation

## 2019-05-06 DIAGNOSIS — Z7982 Long term (current) use of aspirin: Secondary | ICD-10-CM | POA: Insufficient documentation

## 2019-05-06 DIAGNOSIS — I5032 Chronic diastolic (congestive) heart failure: Secondary | ICD-10-CM | POA: Insufficient documentation

## 2019-05-06 DIAGNOSIS — Z79899 Other long term (current) drug therapy: Secondary | ICD-10-CM | POA: Insufficient documentation

## 2019-05-06 DIAGNOSIS — Z955 Presence of coronary angioplasty implant and graft: Secondary | ICD-10-CM | POA: Diagnosis not present

## 2019-05-06 DIAGNOSIS — I110 Hypertensive heart disease with heart failure: Secondary | ICD-10-CM | POA: Diagnosis not present

## 2019-05-06 DIAGNOSIS — Z96653 Presence of artificial knee joint, bilateral: Secondary | ICD-10-CM | POA: Diagnosis not present

## 2019-05-06 DIAGNOSIS — Z79890 Hormone replacement therapy (postmenopausal): Secondary | ICD-10-CM | POA: Diagnosis not present

## 2019-05-06 DIAGNOSIS — Z7984 Long term (current) use of oral hypoglycemic drugs: Secondary | ICD-10-CM | POA: Insufficient documentation

## 2019-05-06 DIAGNOSIS — E1140 Type 2 diabetes mellitus with diabetic neuropathy, unspecified: Secondary | ICD-10-CM | POA: Diagnosis not present

## 2019-05-06 DIAGNOSIS — Z7902 Long term (current) use of antithrombotics/antiplatelets: Secondary | ICD-10-CM | POA: Diagnosis not present

## 2019-05-06 HISTORY — DX: Heart failure, unspecified: I50.9

## 2019-05-06 LAB — HEMOGLOBIN A1C
Hgb A1c MFr Bld: 7.2 % — ABNORMAL HIGH (ref 4.8–5.6)
Mean Plasma Glucose: 159.94 mg/dL

## 2019-05-06 LAB — CBC
HCT: 34.4 % — ABNORMAL LOW (ref 36.0–46.0)
Hemoglobin: 11.1 g/dL — ABNORMAL LOW (ref 12.0–15.0)
MCH: 30.2 pg (ref 26.0–34.0)
MCHC: 32.3 g/dL (ref 30.0–36.0)
MCV: 93.5 fL (ref 80.0–100.0)
Platelets: 130 10*3/uL — ABNORMAL LOW (ref 150–400)
RBC: 3.68 MIL/uL — ABNORMAL LOW (ref 3.87–5.11)
RDW: 14.3 % (ref 11.5–15.5)
WBC: 4.9 10*3/uL (ref 4.0–10.5)
nRBC: 0 % (ref 0.0–0.2)

## 2019-05-06 LAB — BASIC METABOLIC PANEL
Anion gap: 11 (ref 5–15)
BUN: 31 mg/dL — ABNORMAL HIGH (ref 8–23)
CO2: 22 mmol/L (ref 22–32)
Calcium: 9.4 mg/dL (ref 8.9–10.3)
Chloride: 107 mmol/L (ref 98–111)
Creatinine, Ser: 1.2 mg/dL — ABNORMAL HIGH (ref 0.44–1.00)
GFR calc Af Amer: 49 mL/min — ABNORMAL LOW (ref >60)
GFR calc non Af Amer: 42 mL/min — ABNORMAL LOW (ref >60)
Glucose, Bld: 230 mg/dL — ABNORMAL HIGH (ref 70–99)
Potassium: 4.7 mmol/L (ref 3.5–5.1)
Sodium: 140 mmol/L (ref 135–145)

## 2019-05-06 LAB — GLUCOSE, CAPILLARY: Glucose-Capillary: 196 mg/dL — ABNORMAL HIGH (ref 70–99)

## 2019-05-06 NOTE — Progress Notes (Addendum)
PCP - [name], MD Cardiologist - [name], MD  Chest x-ray - pt denies EKG - 11/05/2018  PPM/ICD-Loop recorder DEVICE- Medtronic REP Notified- Yes- emailed 05/06/2019  Stress Test - 10/17/14 in EPIC ECHO - 09/21/18 in EPIC  Cardiac Cath - 09/15/16 in EPIC  Sleep Study - no CPAP - n/a  Fasting Blood Sugar - checks at night -130-150s Checks Blood Sugar 1  times a day  Blood Thinner Instructions: Plavix hold 05/08/2019 Aspirin Instructions: Aspirin 325 mg- pt to call Dr. Ronnald Ramp, unsure if she should continue 325 mg aspirin or 81 mg aspirin  ERAS Protocol- n/a Ensure pre-surgery drink or water given- n/a  COVID testing- 05/11/2019  Anesthesia review: Yes -heart history, Aspirin instructions, pt had recent stroke-does she need neuro clearance?  A1C 7.2  Patient denies shortness of breath, fever, cough and chest pain at PAT appointment  Patient verbalized understanding of instructions that were given to them at the PAT appointment. Patient was also instructed that they will need to review over the PAT instructions again at home before surgery.

## 2019-05-07 ENCOUNTER — Encounter: Payer: Self-pay | Admitting: Physical Therapy

## 2019-05-07 ENCOUNTER — Encounter (HOSPITAL_COMMUNITY): Payer: Self-pay

## 2019-05-07 ENCOUNTER — Ambulatory Visit: Payer: Medicare Other | Admitting: Physical Therapy

## 2019-05-07 DIAGNOSIS — M6281 Muscle weakness (generalized): Secondary | ICD-10-CM | POA: Diagnosis not present

## 2019-05-07 DIAGNOSIS — R2689 Other abnormalities of gait and mobility: Secondary | ICD-10-CM

## 2019-05-07 DIAGNOSIS — R262 Difficulty in walking, not elsewhere classified: Secondary | ICD-10-CM

## 2019-05-07 DIAGNOSIS — R2681 Unsteadiness on feet: Secondary | ICD-10-CM
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
## 2019-05-07 NOTE — Anesthesia Preprocedure Evaluation (Addendum)
Anesthesia Evaluation  Patient identified by MRN, date of birth, ID band Patient awake    Reviewed: Allergy & Precautions, H&P , NPO status , Patient's Chart, lab work & pertinent test results  Airway Mallampati: II   Neck ROM: full    Dental   Pulmonary neg pulmonary ROS,    breath sounds clear to auscultation       Cardiovascular hypertension, + CAD, + Past MI, + Cardiac Stents and +CHF   Rhythm:regular Rate:Normal  Medtronic loop recorder placed 10/2018   Neuro/Psych TIA 2018, CVA 10/2018 TIA Neuromuscular disease CVA    GI/Hepatic Non-alcoholic fatty liver disease with cirrhosis 05/2016 (s/p esophageal banding 06/2016) Esophageal varices s/p banding   Endo/Other  diabetes, Type 2Hypothyroidism   Renal/GU      Musculoskeletal  (+) Arthritis ,   Abdominal   Peds  Hematology   Anesthesia Other Findings   Reproductive/Obstetrics                            Anesthesia Physical Anesthesia Plan  ASA: III  Anesthesia Plan: General   Post-op Pain Management:    Induction: Intravenous  PONV Risk Score and Plan: 3 and Ondansetron, Dexamethasone, Midazolam and Treatment may vary due to age or medical condition  Airway Management Planned: Oral ETT  Additional Equipment:   Intra-op Plan:   Post-operative Plan: Extubation in OR  Informed Consent: I have reviewed the patients History and Physical, chart, labs and discussed the procedure including the risks, benefits and alternatives for the proposed anesthesia with the patient or authorized representative who has indicated his/her understanding and acceptance.       Plan Discussed with: CRNA, Anesthesiologist and Surgeon  Anesthesia Plan Comments: (PAT note written 05/07/2019 by Myra Gianotti, PA-C.  )       Anesthesia Quick Evaluation

## 2019-05-07 NOTE — Therapy (Signed)
[facility] [place], [place], [address] Phone: [phone]   Fax:  [phone]  Physical Therapy Treatment  Patient Details  Name: Tonya Hunter MRN: [id number] Date of Birth: 1935/02/22 Referring Provider (PT): Caprice Beaver, NP (Resp Provider [name])   Encounter Date: 05/07/2019  PT End of Session - 05/07/19 1624    Visit Number  4    Number of Visits  17    Date for PT Re-Evaluation  06/21/19    Authorization Type  medicare and BCBS supplement PN at visit 10      *APPLY KX MODIFIERS    PT Start Time  4627    PT Stop Time  1700    PT Time Calculation (min)  45 min    Activity Tolerance  Patient tolerated treatment well    Behavior During Therapy  [facility] for tasks assessed/performed       Past Medical History:  Diagnosis Date  . Aortic stenosis   . Basal cell carcinoma of face    "several burned off my face" (06/14/2016)  . Carpal tunnel syndrome   . CHF (congestive heart failure) ([place])   . Coronary artery disease 09/2005   s/p TAXUS DRUG-ELUTING STENT PLACEMENT TO THE LEFT ANTERIOR DESCENDING ARTERY  . Diabetes ([place])    type 2  . Esophageal varices (H[facility])    s/p esophageal banding 06/16/16, 07/07/16  . Heart attack ([place]) 2012  . Hematemesis 06/14/2016  . Hyperlipidemia   . Hypertension   . Hypothyroidism   . Myocardial infarction [facility]) 2011   "after my knee replacement"  . Osteoarthrosis, unspecified whether generalized or localized, unspecified site   . Stroke ([place]) 10/2018  . TIA (transient ischemic attack) 02/2017  . Type II diabetes mellitus ([place])   . UTI (urinary tract infection) 11/05/2018    Past Surgical History:  Procedure Laterality Date  . ABDOMINAL HYSTERECTOMY    . APPENDECTOMY    . CATARACT EXTRACTION Bilateral   . CORONARY ANGIOPLASTY WITH STENT PLACEMENT  09/2005   PLACEMENT TO LEFT ANTERIOR DESCENDING ARTERY  . ESOPHAGEAL BANDING N/A 06/16/2016   Procedure: ESOPHAGEAL BANDING;   Surgeon: Teena Irani, MD;  Location: [place];  Service: Endoscopy;  Laterality: N/A;  . ESOPHAGOGASTRODUODENOSCOPY N/A 06/17/2014   Procedure: ESOPHAGOGASTRODUODENOSCOPY (EGD);  Surgeon: Missy Sabins, MD;  [facility];  Service: Endoscopy;  Laterality: N/A;  . ESOPHAGOGASTRODUODENOSCOPY N/A 06/14/2016   Procedure: ESOPHAGOGASTRODUODENOSCOPY (EGD);  Surgeon: Teena Irani, MD;  Location: [facility] ENDOSCOPY;  Service: Endoscopy;  Laterality: N/A;  . ESOPHAGOGASTRODUODENOSCOPY (EGD) WITH PROPOFOL N/A 06/16/2016   Procedure: ESOPHAGOGASTRODUODENOSCOPY (EGD) WITH PROPOFOL;  Surgeon: Teena Irani, MD;  Location: [place];  Service: Endoscopy;  Laterality: N/A;  . ESOPHAGOGASTRODUODENOSCOPY (EGD) WITH PROPOFOL N/A 07/07/2016   Procedure: ESOPHAGOGASTRODUODENOSCOPY (EGD) WITH PROPOFOL;  Surgeon: Teena Irani, MD;  Location: [place];  Service: Endoscopy;  Laterality: N/A;  . FRACTURE SURGERY    . GASTRIC VARICES BANDING N/A 07/07/2016   Procedure: GASTRIC VARICES BANDING;  Surgeon: Teena Irani, MD;  Location: [place];  Service: Endoscopy;  Laterality: N/A;  . HERNIA REPAIR    . HUMERUS FRACTURE SURGERY Left 2001   "put metal disc in months after I broke my shoulder"  . JOINT REPLACEMENT    . LAPAROSCOPIC CHOLECYSTECTOMY  10/02/2001  . LAPAROSCOPIC INCISIONAL / UMBILICAL / VENTRAL HERNIA REPAIR  03/26/2002   s/p repair for incarcerated ventral hernia  . LEFT HEART CATH AND CORONARY ANGIOGRAPHY N/A 09/15/2016   Procedure:  Left Heart Cath and Coronary Angiography;  Surgeon: Peter M Martinique, MD;  Location: [place] CV LAB;  Service: Cardiovascular;  Laterality: N/A;  . LOOP RECORDER INSERTION N/A 10/29/2018   Procedure: LOOP RECORDER INSERTION;  Surgeon: Evans Lance, MD;  Location: [place] CV LAB;  Service: Cardiovascular;  Laterality: N/A;  . MEDIAN NERVE REPAIR Bilateral 2009   DECOMPRESSION...RIGHT AND LEFT DECOMPRESSION  . TOTAL KNEE ARTHROPLASTY Bilateral 2008-2011   "right-left"     There were no vitals filed for this visit.  Subjective Assessment - 05/07/19 1622    Subjective  Missed an appt the other day, was sick (headache) . Doing well today, no AFO though.    Currently in Pain?  No/denies         [facility] Adult PT Treatment/Exercise - 05/07/19 0001      Ambulation/Gait   Gait Comments  6MWT 630 feet       Lumbar Exercises: Seated   Long Arc Quad on Chair  Strengthening;Both;1 set;15 reps    LAQ on Chair Weights (lbs)  4    Sit to Stand  10 reps    Sit to Stand Limitations  5 x 19 sec       Knee/Hip Exercises: Aerobic   Nustep  8 min L 4UE and LE for 8 min       Knee/Hip Exercises: Standing   Heel Raises  Both;1 set;20 reps    Heel Raises Limitations  bends knees, needs cues     Hip Abduction  Stengthening;Both;1 set;10 reps    Forward Step Up  Both;1 set;15 reps;Hand Hold: 1;Step Height: 6"      Ankle Exercises: Supine   T-Band  red x 10 PF, DF , eversion and inversion              PT Education - 05/07/19 1929    Education Details  activity, POC    Person(s) Educated  Patient    Methods  Explanation    Comprehension  Verbalized understanding       PT Short Term Goals - 05/07/19 1638      PT SHORT TERM GOAL #1   Title  Pt will demo heel strike in gait at least 75% of the time    Baseline  improved    Status  On-going      PT SHORT TERM GOAL #2   Title  5TSTS to decrease by 5s    Baseline  done in 19 sec today    Status  On-going        PT Long Term Goals - 05/07/19 1639      PT LONG TERM GOAL #1   Title  10% improvement in 6MWT    Baseline  630 (5% improvement )    Status  On-going      PT LONG TERM GOAL #2   Title  5TSTS to 15s or less    Status  On-going      PT LONG TERM GOAL #3   Title  pt will report ability to ambulate for time allowing her to "walk like I am supposed to"    Status  On-going      PT LONG TERM GOAL #4   Title  Berg Score will improve to 52/56 to demo decreased fall risk.    Status   On-going            Plan - 05/07/19 1931    Clinical Impression Statement  Patient doing well today,  walked and exercised with very minimal rest breaks.  She needed only min cue for heel toe gait, No LOB today. She is hopeful her L foot will be stronger after surgery.  She cancelled her appts the week of surgery but may only need a few after that, depending on how she does.    PT Treatment/Interventions  ADLs/Self Care Home Management;Cryotherapy;Gait training;Stair training;Functional mobility training;Therapeutic activities;Therapeutic exercise;Balance training;Neuromuscular re-education;Manual techniques;Patient/family education;Passive range of motion    PT Next Visit Plan  CKC strengthening- gross LE, ; Balance    PT Home Exercise Plan  seated DF, heel-toe gait, reissued HEP from Neuro (standing balance and strength)    Consulted and Agree with Plan of Care  Patient       Patient will benefit from skilled therapeutic intervention in order to improve the following deficits and impairments:  Abnormal gait, Difficulty walking, Decreased activity tolerance, Decreased endurance, Pain, Decreased strength, Postural dysfunction  Visit Diagnosis: Muscle weakness (generalized)  Difficulty in walking, not elsewhere classified  Unsteadiness on feet  Other abnormalities of gait and mobility     Problem List Patient Active Problem List   Diagnosis Date Noted  . Hypoglycemia due to insulin 11/04/2018  . UTI (urinary tract infection) 11/04/2018  . Palpitations 10/29/2018  . Post concussion syndrome 10/29/2018  . Embolic stroke involving left middle cerebral artery ([place]) s/p tPA 10/26/2018  . Stroke-like symptoms 03/07/2017  . Left shoulder pain 03/07/2017  . Slurred speech   . Chest pain 09/15/2016  . Anemia 06/14/2016  . Hyperkalemia 06/14/2016  . Diabetes mellitus with complication ([place])   . Upper gastrointestinal bleed 06/16/2014  . Hematemesis 06/16/2014  . NECK PAIN  10/28/2009  . OSTEOARTHRITIS 08/26/2008  . Hypothyroidism 08/26/2008  . IDDM (insulin dependent diabetes mellitus) ([place]) 01/03/2007  . Elevated lipids 01/03/2007  . Essential hypertension 01/03/2007  . Coronary atherosclerosis 01/03/2007  . CAD (coronary artery disease) 09/13/2005    Sabrin Dunlevy 05/07/2019, 7:44 PM  [facility] [address] [place], [place], [address] Phone: [phone]   Fax:  [phone]  Name: Tonya Hunter MRN: [id number] Date of Birth: 1935-05-06  Raeford Razor, PT 05/07/19 7:45 PM Phone: [phone] Fax: [phone]

## 2019-05-08 NOTE — Progress Notes (Signed)
ILR remote 

## 2019-05-09 MED ORDER — VANCOMYCIN HCL IN DEXTROSE 1-5 GM/200ML-% IV SOLN
1000.0000 mg | INTRAVENOUS | Status: AC
  Administered 2019-05-13: 1000 mg via INTRAVENOUS
  Filled 2019-05-09: qty 200

## 2019-05-11 ENCOUNTER — Other Ambulatory Visit (HOSPITAL_COMMUNITY)
Admission: RE | Admit: 2019-05-11 | Discharge: 2019-05-11 | Disposition: A | Discharge status: Home / Self Care | Payer: Medicare Other | Source: Ambulatory Visit | Attending: Neurological Surgery | Admitting: Neurological Surgery

## 2019-05-11 DIAGNOSIS — Z20828 Contact with and (suspected) exposure to other viral communicable diseases: Secondary | ICD-10-CM | POA: Insufficient documentation

## 2019-05-11 DIAGNOSIS — Z01812 Encounter for preprocedural laboratory examination: Secondary | ICD-10-CM | POA: Diagnosis present

## 2019-05-11 LAB — SARS CORONAVIRUS 2 (TAT 6-24 HRS): SARS Coronavirus 2: NEGATIVE

## 2019-05-13 ENCOUNTER — Telehealth: Payer: Self-pay | Admitting: Cardiovascular Disease

## 2019-05-13 ENCOUNTER — Ambulatory Visit (HOSPITAL_COMMUNITY): Payer: Medicare Other | Admitting: Vascular Surgery

## 2019-05-13 ENCOUNTER — Encounter (HOSPITAL_COMMUNITY): Payer: Self-pay | Admitting: Neurological Surgery

## 2019-05-13 ENCOUNTER — Encounter (HOSPITAL_BASED_OUTPATIENT_CLINIC_OR_DEPARTMENT_OTHER): Payer: Medicare Other | Admitting: Internal Medicine

## 2019-05-13 ENCOUNTER — Other Ambulatory Visit: Payer: Self-pay

## 2019-05-13 ENCOUNTER — Ambulatory Visit (HOSPITAL_COMMUNITY): Payer: Medicare Other | Admitting: Anesthesiology

## 2019-05-13 ENCOUNTER — Encounter (HOSPITAL_COMMUNITY)
Admission: RE | Disposition: A | Discharge status: Home / Self Care | Payer: Self-pay | Source: Home / Self Care | Attending: Neurological Surgery

## 2019-05-13 ENCOUNTER — Ambulatory Visit (HOSPITAL_COMMUNITY)
Admission: RE | Admit: 2019-05-13 | Discharge: 2019-05-13 | Disposition: A | Discharge status: Home / Self Care | Payer: Medicare Other | Attending: Neurological Surgery | Admitting: Neurological Surgery

## 2019-05-13 DIAGNOSIS — Z79899 Other long term (current) drug therapy: Secondary | ICD-10-CM | POA: Insufficient documentation

## 2019-05-13 DIAGNOSIS — Z880 Allergy status to penicillin: Secondary | ICD-10-CM | POA: Insufficient documentation

## 2019-05-13 DIAGNOSIS — Z885 Allergy status to narcotic agent status: Secondary | ICD-10-CM | POA: Insufficient documentation

## 2019-05-13 DIAGNOSIS — Z85828 Personal history of other malignant neoplasm of skin: Secondary | ICD-10-CM | POA: Insufficient documentation

## 2019-05-13 DIAGNOSIS — I110 Hypertensive heart disease with heart failure: Secondary | ICD-10-CM | POA: Insufficient documentation

## 2019-05-13 DIAGNOSIS — I350 Nonrheumatic aortic (valve) stenosis: Secondary | ICD-10-CM | POA: Diagnosis not present

## 2019-05-13 DIAGNOSIS — E1142 Type 2 diabetes mellitus with diabetic polyneuropathy: Secondary | ICD-10-CM | POA: Insufficient documentation

## 2019-05-13 DIAGNOSIS — M21372 Foot drop, left foot: Secondary | ICD-10-CM | POA: Insufficient documentation

## 2019-05-13 DIAGNOSIS — Z7984 Long term (current) use of oral hypoglycemic drugs: Secondary | ICD-10-CM | POA: Insufficient documentation

## 2019-05-13 DIAGNOSIS — Z955 Presence of coronary angioplasty implant and graft: Secondary | ICD-10-CM | POA: Insufficient documentation

## 2019-05-13 DIAGNOSIS — E039 Hypothyroidism, unspecified: Secondary | ICD-10-CM | POA: Diagnosis not present

## 2019-05-13 DIAGNOSIS — Z8249 Family history of ischemic heart disease and other diseases of the circulatory system: Secondary | ICD-10-CM | POA: Diagnosis not present

## 2019-05-13 DIAGNOSIS — E785 Hyperlipidemia, unspecified: Secondary | ICD-10-CM | POA: Diagnosis not present

## 2019-05-13 DIAGNOSIS — I509 Heart failure, unspecified: Secondary | ICD-10-CM | POA: Diagnosis not present

## 2019-05-13 DIAGNOSIS — Z7982 Long term (current) use of aspirin: Secondary | ICD-10-CM | POA: Diagnosis not present

## 2019-05-13 DIAGNOSIS — I2510 Atherosclerotic heart disease of native coronary artery without angina pectoris: Secondary | ICD-10-CM | POA: Insufficient documentation

## 2019-05-13 DIAGNOSIS — K760 Fatty (change of) liver, not elsewhere classified: Secondary | ICD-10-CM | POA: Diagnosis not present

## 2019-05-13 DIAGNOSIS — Z7902 Long term (current) use of antithrombotics/antiplatelets: Secondary | ICD-10-CM | POA: Diagnosis not present

## 2019-05-13 DIAGNOSIS — Z8673 Personal history of transient ischemic attack (TIA), and cerebral infarction without residual deficits: Secondary | ICD-10-CM | POA: Diagnosis not present

## 2019-05-13 DIAGNOSIS — I252 Old myocardial infarction: Secondary | ICD-10-CM | POA: Diagnosis not present

## 2019-05-13 DIAGNOSIS — Z79890 Hormone replacement therapy (postmenopausal): Secondary | ICD-10-CM | POA: Diagnosis not present

## 2019-05-13 DIAGNOSIS — M1990 Unspecified osteoarthritis, unspecified site: Secondary | ICD-10-CM | POA: Diagnosis not present

## 2019-05-13 DIAGNOSIS — K7460 Unspecified cirrhosis of liver: Secondary | ICD-10-CM | POA: Insufficient documentation

## 2019-05-13 HISTORY — PX: PERONEAL NERVE DECOMPRESSION: SHX2226

## 2019-05-13 LAB — GLUCOSE, CAPILLARY
Glucose-Capillary: 149 mg/dL — ABNORMAL HIGH (ref 70–99)
Glucose-Capillary: 171 mg/dL — ABNORMAL HIGH (ref 70–99)

## 2019-05-13 SURGERY — PERONEAL NERVE DECOMPRESSION
Anesthesia: Monitor Anesthesia Care | Site: Leg Lower | Laterality: Left

## 2019-05-13 MED ORDER — ONDANSETRON HCL 4 MG/2ML IJ SOLN: 4.0000 mg | Freq: Four times a day (QID) | INTRAMUSCULAR | Status: DC | PRN

## 2019-05-13 MED ORDER — DEXAMETHASONE SODIUM PHOSPHATE 10 MG/ML IJ SOLN
10.0000 mg | Freq: Once | INTRAMUSCULAR | Status: DC
  Filled 2019-05-13: qty 1

## 2019-05-13 MED ORDER — POTASSIUM CHLORIDE IN NACL 20-0.9 MEQ/L-% IV SOLN: INTRAVENOUS | Status: DC

## 2019-05-13 MED ORDER — ASPIRIN 81 MG PO TBEC: 81.0000 mg | DELAYED_RELEASE_TABLET | Freq: Every day | ORAL | Status: DC

## 2019-05-13 MED ORDER — ACETAMINOPHEN 325 MG PO TABS: 650.0000 mg | ORAL_TABLET | ORAL | Status: DC | PRN

## 2019-05-13 MED ORDER — EPHEDRINE 5 MG/ML INJ
INTRAVENOUS | Status: AC
  Filled 2019-05-13: qty 10

## 2019-05-13 MED ORDER — MORPHINE SULFATE (PF) 2 MG/ML IV SOLN: 1.0000 mg | INTRAVENOUS | Status: DC | PRN

## 2019-05-13 MED ORDER — HYDROCODONE-ACETAMINOPHEN 5-325 MG PO TABS: 1.0000 | ORAL_TABLET | ORAL | Status: DC | PRN

## 2019-05-13 MED ORDER — PHENYLEPHRINE 40 MCG/ML (10ML) SYRINGE FOR IV PUSH (FOR BLOOD PRESSURE SUPPORT)
PREFILLED_SYRINGE | INTRAVENOUS | Status: AC
  Filled 2019-05-13: qty 10

## 2019-05-13 MED ORDER — SODIUM CHLORIDE 0.9 % IV SOLN: 250.0000 mL | INTRAVENOUS | Status: DC

## 2019-05-13 MED ORDER — LACTATED RINGERS IV SOLN: INTRAVENOUS | Status: DC

## 2019-05-13 MED ORDER — ACETAMINOPHEN 325 MG PO TABS: 650.0000 mg | ORAL_TABLET | Freq: Four times a day (QID) | ORAL | Status: DC | PRN

## 2019-05-13 MED ORDER — MENTHOL 3 MG MT LOZG: 1.0000 | LOZENGE | OROMUCOSAL | Status: DC | PRN

## 2019-05-13 MED ORDER — ROCURONIUM BROMIDE 10 MG/ML (PF) SYRINGE
PREFILLED_SYRINGE | INTRAVENOUS | Status: AC
  Filled 2019-05-13: qty 10

## 2019-05-13 MED ORDER — FENTANYL CITRATE (PF) 100 MCG/2ML IJ SOLN
INTRAMUSCULAR | Status: DC | PRN
  Administered 2019-05-13: 50 ug via INTRAVENOUS
  Administered 2019-05-13: 25 ug via INTRAVENOUS

## 2019-05-13 MED ORDER — PROPOFOL 1000 MG/100ML IV EMUL
INTRAVENOUS | Status: AC
  Filled 2019-05-13: qty 100

## 2019-05-13 MED ORDER — FUROSEMIDE 20 MG PO TABS: 20.0000 mg | ORAL_TABLET | Freq: Every day | ORAL | Status: DC

## 2019-05-13 MED ORDER — OXYCODONE HCL 5 MG PO TABS: 5.0000 mg | ORAL_TABLET | Freq: Once | ORAL | Status: DC | PRN

## 2019-05-13 MED ORDER — PHENOL 1.4 % MT LIQD: 1.0000 | OROMUCOSAL | Status: DC | PRN

## 2019-05-13 MED ORDER — CHLORHEXIDINE GLUCONATE CLOTH 2 % EX PADS: 6.0000 | MEDICATED_PAD | Freq: Once | CUTANEOUS | Status: DC

## 2019-05-13 MED ORDER — PROPOFOL 500 MG/50ML IV EMUL
INTRAVENOUS | Status: DC | PRN
  Administered 2019-05-13: 75 ug/kg/min via INTRAVENOUS

## 2019-05-13 MED ORDER — LIDOCAINE 2% (20 MG/ML) 5 ML SYRINGE
INTRAMUSCULAR | Status: AC
  Filled 2019-05-13: qty 5

## 2019-05-13 MED ORDER — BUPIVACAINE HCL (PF) 0.25 % IJ SOLN
INTRAMUSCULAR | Status: DC | PRN
  Administered 2019-05-13: 7 mL

## 2019-05-13 MED ORDER — PHENYLEPHRINE 40 MCG/ML (10ML) SYRINGE FOR IV PUSH (FOR BLOOD PRESSURE SUPPORT)
PREFILLED_SYRINGE | INTRAVENOUS | Status: DC | PRN
  Administered 2019-05-13 (×10): 40 ug via INTRAVENOUS

## 2019-05-13 MED ORDER — FENTANYL CITRATE (PF) 100 MCG/2ML IJ SOLN: 25.0000 ug | INTRAMUSCULAR | Status: DC | PRN

## 2019-05-13 MED ORDER — LISINOPRIL 2.5 MG PO TABS: 2.5000 mg | ORAL_TABLET | Freq: Every day | ORAL | Status: DC

## 2019-05-13 MED ORDER — ACETAMINOPHEN 650 MG RE SUPP: 650.0000 mg | RECTAL | Status: DC | PRN

## 2019-05-13 MED ORDER — FENTANYL CITRATE (PF) 250 MCG/5ML IJ SOLN
INTRAMUSCULAR | Status: AC
  Filled 2019-05-13: qty 5

## 2019-05-13 MED ORDER — SENNA 8.6 MG PO TABS: 1.0000 | ORAL_TABLET | Freq: Two times a day (BID) | ORAL | Status: DC

## 2019-05-13 MED ORDER — LEVOTHYROXINE SODIUM 137 MCG PO TABS: 137.0000 ug | ORAL_TABLET | Freq: Every day | ORAL | Status: DC

## 2019-05-13 MED ORDER — PROPOFOL 10 MG/ML IV BOLUS
INTRAVENOUS | Status: AC
  Filled 2019-05-13: qty 40

## 2019-05-13 MED ORDER — ONDANSETRON HCL 4 MG PO TABS: 4.0000 mg | ORAL_TABLET | Freq: Four times a day (QID) | ORAL | Status: DC | PRN

## 2019-05-13 MED ORDER — METOPROLOL SUCCINATE ER 50 MG PO TB24: 50.0000 mg | ORAL_TABLET | Freq: Every day | ORAL | Status: DC

## 2019-05-13 MED ORDER — SODIUM CHLORIDE 0.9% FLUSH: 3.0000 mL | INTRAVENOUS | Status: DC | PRN

## 2019-05-13 MED ORDER — OXYCODONE HCL 5 MG/5ML PO SOLN: 5.0000 mg | Freq: Once | ORAL | Status: DC | PRN

## 2019-05-13 MED ORDER — ONDANSETRON HCL 4 MG/2ML IJ SOLN
INTRAMUSCULAR | Status: DC | PRN
  Administered 2019-05-13: 4 mg via INTRAVENOUS

## 2019-05-13 MED ORDER — 0.9 % SODIUM CHLORIDE (POUR BTL) OPTIME
TOPICAL | Status: DC | PRN
  Administered 2019-05-13: 1000 mL

## 2019-05-13 MED ORDER — EPHEDRINE SULFATE-NACL 50-0.9 MG/10ML-% IV SOSY
PREFILLED_SYRINGE | INTRAVENOUS | Status: DC | PRN
  Administered 2019-05-13: 5 mg via INTRAVENOUS

## 2019-05-13 MED ORDER — SODIUM CHLORIDE 0.9% FLUSH: 3.0000 mL | Freq: Two times a day (BID) | INTRAVENOUS | Status: DC

## 2019-05-13 MED ORDER — BUPIVACAINE HCL (PF) 0.25 % IJ SOLN
INTRAMUSCULAR | Status: AC
  Filled 2019-05-13: qty 30

## 2019-05-13 MED ORDER — ONDANSETRON HCL 4 MG/2ML IJ SOLN
INTRAMUSCULAR | Status: AC
  Filled 2019-05-13: qty 2

## 2019-05-13 MED ORDER — METFORMIN HCL 500 MG PO TABS: 500.0000 mg | ORAL_TABLET | Freq: Two times a day (BID) | ORAL | Status: DC

## 2019-05-13 SURGICAL SUPPLY — 47 items
BAG DECANTER FOR FLEXI CONT (MISCELLANEOUS) ×3 IMPLANT
BENZOIN TINCTURE PRP APPL 2/3 (GAUZE/BANDAGES/DRESSINGS) ×3 IMPLANT
BNDG ELASTIC 4X5.8 VLCR STR LF (GAUZE/BANDAGES/DRESSINGS) ×3 IMPLANT
BNDG GAUZE ELAST 4 BULKY (GAUZE/BANDAGES/DRESSINGS) ×3 IMPLANT
CANISTER SUCT 3000ML PPV (MISCELLANEOUS) ×3 IMPLANT
CARTRIDGE OIL MAESTRO DRILL (MISCELLANEOUS) ×1 IMPLANT
CLOSURE WOUND 1/2 X4 (GAUZE/BANDAGES/DRESSINGS) ×1
COVER WAND RF STERILE (DRAPES) ×3 IMPLANT
DERMABOND ADVANCED (GAUZE/BANDAGES/DRESSINGS) ×2
DERMABOND ADVANCED .7 DNX12 (GAUZE/BANDAGES/DRESSINGS) ×1 IMPLANT
DIFFUSER DRILL AIR PNEUMATIC (MISCELLANEOUS) ×3 IMPLANT
DRAPE LAPAROTOMY 100X72 PEDS (DRAPES) ×3 IMPLANT
DRAPE LAPAROTOMY 100X72X124 (DRAPES) IMPLANT
DURAPREP 26ML APPLICATOR (WOUND CARE) ×3 IMPLANT
ELECT REM PT RETURN 9FT ADLT (ELECTROSURGICAL) ×3
ELECTRODE REM PT RTRN 9FT ADLT (ELECTROSURGICAL) ×1 IMPLANT
GAUZE SPONGE 4X4 12PLY STRL (GAUZE/BANDAGES/DRESSINGS) IMPLANT
GLOVE BIO SURGEON STRL SZ 6.5 (GLOVE) ×8 IMPLANT
GLOVE BIO SURGEON STRL SZ7 (GLOVE) ×3 IMPLANT
GLOVE BIO SURGEON STRL SZ8 (GLOVE) ×3 IMPLANT
GLOVE BIO SURGEONS STRL SZ 6.5 (GLOVE) ×4
GLOVE BIOGEL PI IND STRL 6.5 (GLOVE) ×2 IMPLANT
GLOVE BIOGEL PI IND STRL 7.0 (GLOVE) ×2 IMPLANT
GLOVE BIOGEL PI INDICATOR 6.5 (GLOVE) ×4
GLOVE BIOGEL PI INDICATOR 7.0 (GLOVE) ×4
GOWN STRL REUS W/ TWL LRG LVL3 (GOWN DISPOSABLE) ×2 IMPLANT
GOWN STRL REUS W/ TWL XL LVL3 (GOWN DISPOSABLE) ×1 IMPLANT
GOWN STRL REUS W/TWL 2XL LVL3 (GOWN DISPOSABLE) IMPLANT
GOWN STRL REUS W/TWL LRG LVL3 (GOWN DISPOSABLE) ×4
GOWN STRL REUS W/TWL XL LVL3 (GOWN DISPOSABLE) ×2
KIT BASIN OR (CUSTOM PROCEDURE TRAY) ×3 IMPLANT
KIT TURNOVER KIT B (KITS) ×3 IMPLANT
LOOP VESSEL MAXI BLUE (MISCELLANEOUS) IMPLANT
LOOP VESSEL MINI RED (MISCELLANEOUS) ×3 IMPLANT
NEEDLE HYPO 25X1 1.5 SAFETY (NEEDLE) ×3 IMPLANT
NS IRRIG 1000ML POUR BTL (IV SOLUTION) ×3 IMPLANT
OIL CARTRIDGE MAESTRO DRILL (MISCELLANEOUS) ×3
PACK LAMINECTOMY NEURO (CUSTOM PROCEDURE TRAY) ×3 IMPLANT
PAD ARMBOARD 7.5X6 YLW CONV (MISCELLANEOUS) ×3 IMPLANT
STOCKINETTE 4X48 STRL (DRAPES) ×3 IMPLANT
STRIP CLOSURE SKIN 1/2X4 (GAUZE/BANDAGES/DRESSINGS) ×2 IMPLANT
SUT VIC AB 2-0 CP2 18 (SUTURE) ×3 IMPLANT
SUT VIC AB 3-0 SH 8-18 (SUTURE) ×6 IMPLANT
TOWEL GREEN STERILE (TOWEL DISPOSABLE) ×3 IMPLANT
TOWEL GREEN STERILE FF (TOWEL DISPOSABLE) ×3 IMPLANT
UNDERPAD 30X30 (UNDERPADS AND DIAPERS) ×3 IMPLANT
WATER STERILE IRR 1000ML POUR (IV SOLUTION) ×3 IMPLANT

## 2019-05-13 NOTE — H&P (Signed)
Subjective: Patient is a 84 y.o. female admitted for L foot drop. Onset of symptoms was several months ago, gradually worsening since that time.  The pain is rated none, . The pain is described as none and occurs only occasionally. The symptoms have been progressive. Symptoms are exacerbated by nothing in particular. MRI or CT showed lumbar spondylosis.   Past Medical History:  Diagnosis Date  . Aortic stenosis   . Basal cell carcinoma of face    "several burned off my face" (06/14/2016)  . Carpal tunnel syndrome   . CHF (congestive heart failure) ([place])   . Coronary artery disease 09/2005   s/p TAXUS DRUG-ELUTING STENT PLACEMENT TO THE LEFT ANTERIOR DESCENDING ARTERY  . Diabetes ([place])    type 2  . Esophageal varices (HCC)    s/p esophageal banding 06/16/16, 07/07/16  . Heart attack ([place]) 2012  . Hematemesis 06/14/2016  . Hyperlipidemia   . Hypertension   . Hypothyroidism   . Myocardial infarction [facility]) 2011   "after my knee replacement"  . Osteoarthrosis, unspecified whether generalized or localized, unspecified site   . Stroke ([place]) 10/2018  . TIA (transient ischemic attack) 02/2017  . Type II diabetes mellitus ([place])   . UTI (urinary tract infection) 11/05/2018    Past Surgical History:  Procedure Laterality Date  . ABDOMINAL HYSTERECTOMY    . APPENDECTOMY    . CATARACT EXTRACTION Bilateral   . CORONARY ANGIOPLASTY WITH STENT PLACEMENT  09/2005   PLACEMENT TO LEFT ANTERIOR DESCENDING ARTERY  . ESOPHAGEAL BANDING N/A 06/16/2016   Procedure: ESOPHAGEAL BANDING;  Surgeon: Teena Irani, MD;  Location: [place];  Service: Endoscopy;  Laterality: N/A;  . ESOPHAGOGASTRODUODENOSCOPY N/A 06/17/2014   Procedure: ESOPHAGOGASTRODUODENOSCOPY (EGD);  Surgeon: Missy Sabins, MD;  Location: [facility] ENDOSCOPY;  Service: Endoscopy;  Laterality: N/A;  . ESOPHAGOGASTRODUODENOSCOPY N/A 06/14/2016   Procedure: ESOPHAGOGASTRODUODENOSCOPY (EGD);  Surgeon: Teena Irani, MD;  Location: [facility] ENDOSCOPY;  Service:  Endoscopy;  Laterality: N/A;  . ESOPHAGOGASTRODUODENOSCOPY (EGD) WITH PROPOFOL N/A 06/16/2016   Procedure: ESOPHAGOGASTRODUODENOSCOPY (EGD) WITH PROPOFOL;  Surgeon: Teena Irani, MD;  Location: [place];  Service: Endoscopy;  Laterality: N/A;  . ESOPHAGOGASTRODUODENOSCOPY (EGD) WITH PROPOFOL N/A 07/07/2016   Procedure: ESOPHAGOGASTRODUODENOSCOPY (EGD) WITH PROPOFOL;  Surgeon: Teena Irani, MD;  Location: [place];  Service: Endoscopy;  Laterality: N/A;  . FRACTURE SURGERY    . GASTRIC VARICES BANDING N/A 07/07/2016   Procedure: GASTRIC VARICES BANDING;  Surgeon: Teena Irani, MD;  Location: [place];  Service: Endoscopy;  Laterality: N/A;  . HERNIA REPAIR    . HUMERUS FRACTURE SURGERY Left 2001   "put metal disc in months after I broke my shoulder"  . JOINT REPLACEMENT    . LAPAROSCOPIC CHOLECYSTECTOMY  10/02/2001  . LAPAROSCOPIC INCISIONAL / UMBILICAL / VENTRAL HERNIA REPAIR  03/26/2002   s/p repair for incarcerated ventral hernia  . LEFT HEART CATH AND CORONARY ANGIOGRAPHY N/A 09/15/2016   Procedure: Left Heart Cath and Coronary Angiography;  Surgeon: Peter M Martinique, MD;  Location: [place] CV LAB;  Service: Cardiovascular;  Laterality: N/A;  . LOOP RECORDER INSERTION N/A 10/29/2018   Procedure: LOOP RECORDER INSERTION;  Surgeon: Evans Lance, MD;  Location: [place] CV LAB;  Service: Cardiovascular;  Laterality: N/A;  . MEDIAN NERVE REPAIR Bilateral 2009   DECOMPRESSION...RIGHT AND LEFT DECOMPRESSION  . TOTAL KNEE ARTHROPLASTY Bilateral 2008-2011   "right-left"    Prior to Admission medications   Medication Sig Start Date End Date Taking? Authorizing Provider  acetaminophen (TYLENOL)  325 MG tablet Take 2 tablets (650 mg total) by mouth every 6 (six) hours as needed for mild pain, fever or headache. 11/07/18  Yes Swayze, [name], DO  aspirin EC 81 MG EC tablet Take 1 tablet (81 mg total) by mouth daily. 10/30/18  Yes Donzetta Starch, NP  clopidogrel (PLAVIX) 75 MG tablet Take 1 tablet  (75 mg total) by mouth at bedtime. 04/18/18  Yes Josue Hector, MD  furosemide (LASIX) 40 MG tablet Take 0.5 tablets (20 mg total) by mouth daily. 11/07/18  Yes Swayze, [name], DO  levothyroxine (SYNTHROID, LEVOTHROID) 137 MCG tablet Take 137 mcg by mouth daily before breakfast.    Yes [provider]  lisinopril (ZESTRIL) 2.5 MG tablet Take 2.5 mg by mouth daily.   Yes [provider]  metFORMIN (GLUCOPHAGE) 500 MG tablet Take 1 tablet (500 mg total) by mouth 2 (two) times daily with a meal. 11/07/18  Yes Swayze, [name], DO  metoprolol succinate (TOPROL-XL) 50 MG 24 hr tablet Take 1 tablet (50 mg total) by mouth at bedtime. Take with or immediately following a meal. 03/12/19  Yes Josue Hector, MD  Multiple Vitamins-Minerals (PRESERVISION AREDS 2) CAPS Take 1 capsule by mouth 2 (two) times daily.    Yes [provider]  simvastatin (ZOCOR) 20 MG tablet Take 20 mg by mouth daily. 02/04/19  Yes [provider]  ACCU-CHEK SMARTVIEW test strip 1 each by Other route 3 (three) times daily.  06/06/18   [provider]  nitroGLYCERIN (NITROSTAT) 0.4 MG SL tablet Place 1 tablet (0.4 mg total) under the tongue every 5 (five) minutes as needed for chest pain. Max 3 doses. Patient taking differently: Place 0.4 mg under the tongue every 5 (five) minutes x 3 doses as needed for chest pain.  09/13/18   Josue Hector, MD   Allergies  Allergen Reactions  . Penicillins Swelling and Other (See Comments)    Caused weakness and swelling of the feet Has patient had a PCN reaction causing immediate rash, facial/tongue/throat swelling, SOB or lightheadedness with hypotension: Yes Has patient had a PCN reaction causing severe rash involving mucus membranes or skin necrosis: Unk Has patient had a PCN reaction that required hospitalization: Yes Has patient had a PCN reaction occurring within the last 10 years: No If all of the above answers are "NO", then may proceed with  Cephalosporin use.  . Demerol [Meperidine] Nausea And Vomiting    Social History   Tobacco Use  . Smoking status: Never Smoker  . Smokeless tobacco: Never Used  Substance Use Topics  . Alcohol use: No    Family History  Problem Relation Age of Onset  . Cancer Mother   . Heart attack Father      Review of Systems  Positive ROS: neg  All other systems have been reviewed and were otherwise negative with the exception of those mentioned in the HPI and as above.  Objective: Vital signs in last 24 hours: Temp:  [97.6 F (36.4 C)] 97.6 F (36.4 C) (12/28 0606) Pulse Rate:  [61] 61 (12/28 0606) Resp:  [18] 18 (12/28 0606) BP: (124)/(61) 124/61 (12/28 0606) SpO2:  [99 %] 99 % (12/28 0606) Weight:  [75.8 kg] 75.8 kg (12/28 0606)  General Appearance: Alert, cooperative, no distress, appears stated age Head: Normocephalic, without obvious abnormality, atraumatic Eyes: PERRL, conjunctiva/corneas clear, EOM's intact    Neck: Supple, symmetrical, trachea midline Back: Symmetric, no curvature, ROM normal, no CVA tenderness Lungs:  respirations  unlabored Heart: Regular rate and rhythm Abdomen: Soft, non-tender Extremities: Extremities normal, atraumatic, no cyanosis or edema Pulses: 2+ and symmetric all extremities Skin: Skin color, texture, turgor normal, no rashes or lesions  NEUROLOGIC:   Mental status: Alert and oriented x4,  no aphasia, good attention span, fund of knowledge, and memory Motor Exam - grossly normal except partial L footdrop Sensory Exam - grossly normal Reflexes: trace Coordination - grossly normal Gait - grossly normal Balance - grossly normal Cranial Nerves: I: smell Not tested  II: visual acuity  OS: nl    OD: nl  II: visual fields Full to confrontation  II: pupils Equal, round, reactive to light  III,VII: ptosis None  III,IV,VI: extraocular muscles  Full ROM  V: mastication Normal  V: facial light touch sensation  Normal  V,VII: corneal reflex   Present  VII: facial muscle function - upper  Normal  VII: facial muscle function - lower Normal  VIII: hearing Not tested  IX: soft palate elevation  Normal  IX,X: gag reflex Present  XI: trapezius strength  5/5  XI: sternocleidomastoid strength 5/5  XI: neck flexion strength  5/5  XII: tongue strength  Normal    Data Review Lab Results  Component Value Date   WBC 4.9 05/06/2019   HGB 11.1 (L) 05/06/2019   HCT 34.4 (L) 05/06/2019   MCV 93.5 05/06/2019   PLT 130 (L) 05/06/2019   Lab Results  Component Value Date   NA 140 05/06/2019   K 4.7 05/06/2019   CL 107 05/06/2019   CO2 22 05/06/2019   BUN 31 (H) 05/06/2019   CREATININE 1.20 (H) 05/06/2019   GLUCOSE 230 (H) 05/06/2019   Lab Results  Component Value Date   INR 1.1 11/04/2018    Assessment/Plan:  Estimated body mass index is 26.95 kg/m as calculated from the following:   Height as of this encounter: 5' 6"  (1.676 m).   Weight as of this encounter: 75.8 kg. Patient admitted for L peroneal n decompression. Patient has failed a reasonable attempt at conservative therapy.  I explained the condition and procedure to the patient and answered any questions.  Patient wishes to proceed with procedure as planned. Understands risks/ benefits and typical outcomes of procedure.   Eustace Moore 05/13/2019 7:42 AM

## 2019-05-13 NOTE — Telephone Encounter (Signed)
Patient aware of recommendation.  

## 2019-05-13 NOTE — Telephone Encounter (Signed)
Can start back in 5 days

## 2019-05-13 NOTE — Telephone Encounter (Signed)
Called patient back about her message. Patient stated she had surgery on her knee today and was told to call our office to see when she needs to start back on her Plavix. Patient held Plavix for 5 days for her procedure. Will send message to Dr. Johnsie Cancel for advisement.

## 2019-05-13 NOTE — Anesthesia Postprocedure Evaluation (Signed)
Anesthesia Post Note  Patient: Tonya Hunter  Procedure(s) Performed: LEFT PERONEAL NERVE DECOMPRESSION (Left Leg Lower)     Patient location during evaluation: PACU Anesthesia Type: MAC Level of consciousness: awake and alert Pain management: pain level controlled Vital Signs Assessment: post-procedure vital signs reviewed and stable Respiratory status: spontaneous breathing, nonlabored ventilation, respiratory function stable and patient connected to nasal cannula oxygen Cardiovascular status: stable and blood pressure returned to baseline Postop Assessment: no apparent nausea or vomiting Anesthetic complications: no    Last Vitals:  Vitals:   05/13/19 0955 05/13/19 1000  BP: 127/61   Pulse: 63 63  Resp: 16 16  Temp: 36.5 C   SpO2: 98% 99%    Last Pain:  Vitals:   05/13/19 1000  PainSc: 0-No pain                 Kj Imbert S

## 2019-05-13 NOTE — Telephone Encounter (Signed)
   Pt c/o medication issue:  1. Name of Medication: Plavix  2. How are you currently taking this medication (dosage and times per day)? Med was held for 12/28 procedure  3. Are you having a reaction (difficulty breathing--STAT)? no  4. What is your medication issue? Patient wants to know when she should resume Plavix, please call [phone]

## 2019-05-13 NOTE — Op Note (Signed)
05/13/2019  8:56 AM  PATIENT:  Tonya Hunter  84 y.o. 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  Nerve conduction studies showed left peroneal neuropathy at the left fibular head. The patient tried conservative measures without relief. Recommended left peroneal nerve decompression. Patient understood the risks, benefits, and alternatives and potential outcomes and wished to proceed.  PROCEDURE DETAILS: The patient was taken to the operating room and positioned with bumps under her left shoulder and hip to expose the left lateral leg.  Pressure points were padded.  We marked a curvilinear incision behind the left fibular head.  The area was prepped with DuraPrep and then draped in usual sterile fashion.  7 cc of local anesthesia was injected and a small curvilinear incision was made behind the fibular head on the left.  We dissected down through the subcutaneous tissues to expose the underlying fascia.  Self-retaining retractors were placed.  The fascia was opened and the underlying peroneal nerve was identified.  It was sitting just behind the fibular head and was encased in thickened tissue.  We dissected between the nerve and the tissue and transected it both proximally and distally.  We then dissected circumferentially around the nerve just behind the fibular head and placed a vessel loop to pull the nerve gently away from the fibular head.  The fascia was opened over the fibular head and a Leksell rongeur was used to flatten the fibular head to decrease the pressure from the head on the peroneal nerve.  We then  waxed the bony edge and closed the fascia with 2-0 Vicryl.  We palpated both proximally and distally and the nerve appeared to be free.  It looked much better.  We irrigated with saline solution containing bacitracin.  We closed the subcutaneous tissues with 2-0 Vicryl in the subcuticular tissue with 3-0 Vicryl.  The skin was closed with Dermabond.  Once this dried we wrapped the leg in a Kerlix and an Ace bandage.  The patient was then awakened and transferred to the recovery room in stable condition.  At the end of the procedure all sponge needle and instrument counts were correct.   PLAN OF CARE: Discharge to home after PACU  PATIENT DISPOSITION:  PACU - hemodynamically stable.   Delay start of Pharmacological VTE agent (>24hrs) due to surgical blood loss or risk of bleeding:  yes

## 2019-05-13 NOTE — Transfer of Care (Signed)
Immediate Anesthesia Transfer of Care Note  Patient: Tonya Hunter  Procedure(s) Performed: LEFT PERONEAL NERVE DECOMPRESSION (Left Leg Lower)  Patient Location: PACU  Anesthesia Type:MAC  Level of Consciousness: awake, alert  and oriented  Airway & Oxygen Therapy: Patient Spontanous Breathing  Post-op Assessment: Report given to RN and Post -op Vital signs reviewed and stable  Post vital signs: Reviewed and stable  Last Vitals:  Vitals Value Taken Time  BP 125/78 05/13/19 0855  Temp    Pulse 71 05/13/19 0856  Resp 13 05/13/19 0856  SpO2 97 % 05/13/19 0856  Vitals shown include unvalidated device data.  Last Pain:  Vitals:   05/13/19 0648  PainSc: 0-No pain      Patients Stated Pain Goal: 2 (65/46/50 3546)  Complications: No apparent anesthesia complications

## 2019-05-14 ENCOUNTER — Ambulatory Visit: Payer: Medicare Other | Admitting: Physical Therapy

## 2019-05-16 ENCOUNTER — Ambulatory Visit: Payer: Medicare Other | Admitting: Physical Therapy

## 2019-05-16 ENCOUNTER — Ambulatory Visit (INDEPENDENT_AMBULATORY_CARE_PROVIDER_SITE_OTHER): Payer: Medicare Other | Admitting: *Deleted

## 2019-05-16 DIAGNOSIS — R2990 Unspecified symptoms and signs involving the nervous system: Secondary | ICD-10-CM | POA: Diagnosis not present

## 2019-05-16 LAB — CUP PACEART REMOTE DEVICE CHECK
Date Time Interrogation Session: 20201230231939
Implantable Pulse Generator Implant Date: 20200615

## 2019-05-21 ENCOUNTER — Other Ambulatory Visit: Payer: Self-pay

## 2019-05-21 ENCOUNTER — Ambulatory Visit: Payer: Medicare Other | Attending: Diagnostic Neuroimaging | Admitting: Physical Therapy

## 2019-05-21 ENCOUNTER — Encounter: Payer: Self-pay | Admitting: Physical Therapy

## 2019-05-21 DIAGNOSIS — R262 Difficulty in walking, not elsewhere classified: Secondary | ICD-10-CM | POA: Diagnosis not present

## 2019-05-21 DIAGNOSIS — R2681 Unsteadiness on feet: Secondary | ICD-10-CM | POA: Diagnosis not present

## 2019-05-21 DIAGNOSIS — R2689 Other abnormalities of gait and mobility: Secondary | ICD-10-CM | POA: Diagnosis not present

## 2019-05-21 DIAGNOSIS — M6281 Muscle weakness (generalized): Secondary | ICD-10-CM | POA: Diagnosis not present
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
## 2019-05-22 ENCOUNTER — Other Ambulatory Visit (HOSPITAL_COMMUNITY)
Admission: RE | Admit: 2019-05-22 | Discharge: 2019-05-22 | Disposition: A | Discharge status: Home / Self Care | Payer: Medicare Other | Source: Other Acute Inpatient Hospital | Attending: Physician Assistant | Admitting: Physician Assistant

## 2019-05-22 ENCOUNTER — Encounter (HOSPITAL_BASED_OUTPATIENT_CLINIC_OR_DEPARTMENT_OTHER): Payer: Medicare Other | Admitting: Physician Assistant

## 2019-05-22 DIAGNOSIS — X58XXXA Exposure to other specified factors, initial encounter: Secondary | ICD-10-CM | POA: Insufficient documentation

## 2019-05-22 DIAGNOSIS — I110 Hypertensive heart disease with heart failure: Secondary | ICD-10-CM | POA: Diagnosis not present

## 2019-05-22 DIAGNOSIS — Z7982 Long term (current) use of aspirin: Secondary | ICD-10-CM | POA: Diagnosis not present

## 2019-05-22 DIAGNOSIS — I2510 Atherosclerotic heart disease of native coronary artery without angina pectoris: Secondary | ICD-10-CM | POA: Diagnosis not present

## 2019-05-22 DIAGNOSIS — Z7902 Long term (current) use of antithrombotics/antiplatelets: Secondary | ICD-10-CM | POA: Insufficient documentation

## 2019-05-22 DIAGNOSIS — I5042 Chronic combined systolic (congestive) and diastolic (congestive) heart failure: Secondary | ICD-10-CM | POA: Insufficient documentation

## 2019-05-22 DIAGNOSIS — L97822 Non-pressure chronic ulcer of other part of left lower leg with fat layer exposed: Secondary | ICD-10-CM | POA: Insufficient documentation

## 2019-05-22 DIAGNOSIS — L97512 Non-pressure chronic ulcer of other part of right foot with fat layer exposed: Secondary | ICD-10-CM | POA: Diagnosis not present

## 2019-05-22 DIAGNOSIS — E11622 Type 2 diabetes mellitus with other skin ulcer: Secondary | ICD-10-CM | POA: Diagnosis not present

## 2019-05-22 DIAGNOSIS — S81801A Unspecified open wound, right lower leg, initial encounter: Secondary | ICD-10-CM | POA: Diagnosis not present

## 2019-05-22 DIAGNOSIS — E11621 Type 2 diabetes mellitus with foot ulcer: Secondary | ICD-10-CM | POA: Diagnosis not present

## 2019-05-23 ENCOUNTER — Ambulatory Visit: Payer: Medicare Other | Admitting: Physical Therapy

## 2019-05-25 LAB — AEROBIC CULTURE W GRAM STAIN (SUPERFICIAL SPECIMEN): Culture: NO GROWTH

## 2019-05-27 ENCOUNTER — Encounter (HOSPITAL_BASED_OUTPATIENT_CLINIC_OR_DEPARTMENT_OTHER): Payer: Medicare Other | Admitting: Internal Medicine

## 2019-05-27 ENCOUNTER — Other Ambulatory Visit: Payer: Self-pay

## 2019-05-27 DIAGNOSIS — I110 Hypertensive heart disease with heart failure: Secondary | ICD-10-CM | POA: Diagnosis not present

## 2019-05-27 DIAGNOSIS — E11622 Type 2 diabetes mellitus with other skin ulcer: Secondary | ICD-10-CM | POA: Diagnosis not present

## 2019-05-27 DIAGNOSIS — I5042 Chronic combined systolic (congestive) and diastolic (congestive) heart failure: Secondary | ICD-10-CM | POA: Diagnosis not present

## 2019-05-27 DIAGNOSIS — L97512 Non-pressure chronic ulcer of other part of right foot with fat layer exposed: Secondary | ICD-10-CM | POA: Diagnosis not present

## 2019-05-27 DIAGNOSIS — L97822 Non-pressure chronic ulcer of other part of left lower leg with fat layer exposed: Secondary | ICD-10-CM | POA: Diagnosis not present

## 2019-05-27 DIAGNOSIS — S81801A Unspecified open wound, right lower leg, initial encounter: Secondary | ICD-10-CM | POA: Diagnosis not present

## 2019-05-28 ENCOUNTER — Encounter: Payer: Medicare Other | Admitting: Physical Therapy

## 2019-05-28 NOTE — Progress Notes (Signed)
CASHMERE, DINGLEY ([id number]) Visit Report for 05/22/2019 Allergy List Details Patient Name: Date of Service: Tonya Hunter, Tonya Hunter 05/22/2019 9:00 AM Medical Record [id number] Patient Account Number: [id number] Date of Birth/Sex: Treating RN: 1935-02-27 (85 y.o. Tonya Hunter Primary Care Katesha Eichel: [name] Other Clinician: Referring [name]: Treating [name]/Extender:Stone III, [name], [name] Weeks in Treatment: 0 Allergies Active Allergies penicillin Reaction: swelling Severity: Moderate Demerol Reaction: nausea/vomiting Severity: Moderate Allergy Notes Electronic Signature(s) Signed: 05/28/2019 6:18:44 PM By: Levan Hurst RN, BSN Entered By: Levan Hurst on 05/22/2019 09:18:30 -------------------------------------------------------------------------------- Arrival Information Details Patient Name: Date of Service: Tonya Hunter 05/22/2019 9:00 AM Medical Record [id number] Patient Account Number: [id number] Date of Birth/Sex: Treating RN: [birth year] (85 y.o. Tonya Hunter Primary Care Shalin Vonbargen: [name] Other Clinician: Referring [name]: Treating [name]/Extender:Stone III, [name], Richard Janett Billow in Treatment: 0 Visit Information Patient Arrived: Walker Arrival Time: 09:03 Accompanied By: alone Transfer Assistance: None Patient Identification Verified: Yes Secondary Verification Process Yes Completed: Patient Requires Transmission-Based No Precautions: Patient Has Alerts: Yes Patient Alerts: Patient on Blood Thinner L ABI non compressible Electronic Signature(s) Signed: 05/28/2019 6:18:44 PM By: Levan Hurst RN, BSN Entered By: Levan Hurst on 05/22/2019 09:44:55 -------------------------------------------------------------------------------- Clinic Level of Care Assessment Details Patient Name: Date of Service: Tonya Hunter. 05/22/2019 9:00 AM Medical Record [id number] Patient Account  Number: [id number] Date of Birth/Sex: Treating RN: [birth year] (84 y.o. Tonya Hunter Primary Care Alisyn Lequire: [name] Other Clinician: Referring Avanti Jetter: Treating [name]/Extender:Stone III, [name], [name] Weeks in Treatment: 0 Clinic Level of Care Assessment Items TOOL 2 Quantity Score []  - Use when only an EandM is performed on the INITIAL visit 0 ASSESSMENTS - Nursing Assessment / Reassessment X - General Physical Exam (combine w/ comprehensive assessment (listed just below) 1 20 when performed on new pt. evals) X - Comprehensive Assessment (HX, ROS, Risk Assessments, Wounds Hx, etc.) 1 25 ASSESSMENTS - Wound and Skin Assessment / Reassessment []  - Simple Wound Assessment / Reassessment - one wound 0 X - Complex Wound Assessment / Reassessment - multiple wounds 2 5 []  - Dermatologic / Skin Assessment (not related to wound area) 0 ASSESSMENTS - Ostomy and/or Continence Assessment and Care []  - Incontinence Assessment and Management 0 []  - Ostomy Care Assessment and Management (repouching, etc.) 0 PROCESS - Coordination of Care X - Simple Patient / Family Education for ongoing care 1 15 []  - Complex (extensive) Patient / Family Education for ongoing care 0 X - Staff obtains Programmer, systems, Records, Test Results / Process Orders 1 10 []  - Staff telephones HHA, Nursing Homes / Clarify orders / etc 0 []  - Routine Transfer to another Facility (non-emergent condition) 0 []  - Routine Hospital Admission (non-emergent condition) 0 []  - New Admissions / Biomedical engineer / Ordering NPWT, Apligraf, etc. 0 []  - Emergency Hospital Admission (emergent condition) 0 X - Simple Discharge Coordination 1 10 []  - Complex (extensive) Discharge Coordination 0 PROCESS - Special Needs []  - Pediatric / Minor Patient Management 0 []  - Isolation Patient Management 0 []  - Hearing / Language / Visual special needs 0 []  - Assessment of Community assistance (transportation, D/C  planning, etc.) 0 []  - Additional assistance / Altered mentation 0 []  - Support Surface(s) Assessment (bed, cushion, seat, etc.) 0 INTERVENTIONS - Wound Cleansing / Measurement X - Wound Imaging (photographs - any number of wounds) 1 5 []  - Wound Tracing (instead of photographs) 0 []  - Simple Wound Measurement - one wound 0 X -  Complex Wound Measurement - multiple wounds 2 5 []  - Simple Wound Cleansing - one wound 0 X - Complex Wound Cleansing - multiple wounds 2 5 INTERVENTIONS - Wound Dressings X - Small Wound Dressing one or multiple wounds 1 10 []  - Medium Wound Dressing one or multiple wounds 0 []  - Large Wound Dressing one or multiple wounds 0 []  - Application of Medications - injection 0 INTERVENTIONS - Miscellaneous []  - External ear exam 0 []  - Specimen Collection (cultures, biopsies, blood, body fluids, etc.) 0 []  - Specimen(s) / Culture(s) sent or taken to Lab for analysis 0 []  - Patient Transfer (multiple staff / Civil Service fast streamer / Similar devices) 0 []  - Simple Staple / Suture removal (25 or less) 0 []  - Complex Staple / Suture removal (26 or more) 0 []  - Hypo / Hyperglycemic Management (close monitor of Blood Glucose) 0 X - Ankle / Brachial Index (ABI) - do not check if billed separately 1 15 Has the patient been seen at the hospital within the last three years: Yes Total Score: 140 Level Of Care: New/Established - Level 4 Electronic Signature(s) Signed: 05/22/2019 6:06:10 PM By: [name] RN, BSN Entered By: [name] on 05/22/2019 10:09:51 -------------------------------------------------------------------------------- Lower Extremity Assessment Details Patient Name: Date of Service: Tonya Hunter. 05/22/2019 9:00 AM Medical Record [id number] Patient Account Number: [id number] Date of Birth/Sex: Treating RN: 08-04-1934 (85 y.o. Tonya Hunter Primary Care Marcianne Ozbun: [name] Other Clinician: Referring [name]: Treating [name]/Extender:Stone  III, [name], [name] Weeks in Treatment: 0 Edema Assessment Assessed: [Left: No] [Right: No] E[Left: dema] [Right: :] Calf Left: Right: Point of Measurement: 31 cm From Medial Instep 25.4 cm 24 cm Ankle Left: Right: Point of Measurement: 9 cm From Medial Instep 35 cm 35 cm Vascular Assessment Pulses: Dorsalis Pedis Palpable: [Left:Yes] [Right:Yes] Blood Pressure: Brachial: [Right:114] Ankle: [Right:Dorsalis Pedis: 120 1.05] Electronic Signature(s) Signed: 05/28/2019 6:18:44 PM By: Levan Hurst RN, BSN Entered By: Levan Hurst on 05/22/2019 09:44:37 -------------------------------------------------------------------------------- [place] Details Patient Name: Date of Service: Tonya Hunter. 05/22/2019 9:00 AM Medical Record [id number] Patient Account Number: [id number] Date of Birth/Sex: Treating RN: Oct 29, 1934 (85 y.o. Tonya Hunter Primary Care Valor Quaintance: [name] Other Clinician: Referring Myran Arcia: Treating Halsey Persaud/Extender:Stone III, [name], [name] Weeks in Treatment: 0 Active Inactive Abuse / Safety / Falls / Self Care Management Nursing Diagnoses: Potential for falls Goals: Patient/caregiver will verbalize/demonstrate measures taken to prevent injury and/or falls Date Initiated: 05/22/2019 Target Resolution Date: 06/19/2019 Goal Status: Active Interventions: Assess fall risk on admission and as needed Assess impairment of mobility on admission and as needed per policy Assess personal safety and home safety (as indicated) on admission and as needed Notes: Nutrition Nursing Diagnoses: Impaired glucose control: actual or potential Goals: Patient/caregiver will maintain therapeutic glucose control Date Initiated: 05/22/2019 Target Resolution Date: 06/19/2019 Goal Status: Active Interventions: Assess HgA1c results as ordered upon admission and as needed Assess patient nutrition upon admission and as  needed per policy Provide education on elevated blood sugars and impact on wound healing Treatment Activities: Patient referred to Primary Care Physician for further nutritional evaluation : 05/22/2019 Notes: Wound/Skin Impairment Nursing Diagnoses: Impaired tissue integrity Knowledge deficit related to ulceration/compromised skin integrity Goals: Patient/caregiver will verbalize understanding of skin care regimen Date Initiated: 05/22/2019 Target Resolution Date: 06/19/2019 Goal Status: Active Ulcer/skin breakdown will have a volume reduction of 30% by week 4 Date Initiated: 05/22/2019 Target Resolution Date: 06/19/2019 Goal Status: Active Interventions: Assess patient/caregiver ability to  obtain necessary supplies Assess patient/caregiver ability to perform ulcer/skin care regimen upon admission and as needed Assess ulceration(s) every visit Provide education on ulcer and skin care Treatment Activities: Skin care regimen initiated : 05/22/2019 Topical wound management initiated : 05/22/2019 Notes: Electronic Signature(s) Signed: 05/22/2019 6:06:10 PM By: [name] RN, BSN Entered By: [name] on 05/22/2019 10:03:40 -------------------------------------------------------------------------------- Pain Assessment Details Patient Name: Date of Service: Tonya Hunter 05/22/2019 9:00 AM Medical Record [id number] Patient Account Number: [id number] Date of Birth/Sex: Treating RN: August 30, 1934 (85 y.o. Tonya Hunter Primary Care Alexya Mcdaris: [name] Other Clinician: Referring Stiven Kaspar: Treating Rylie Limburg/Extender:Stone III, [name], [name] Weeks in Treatment: 0 Active Problems Location of Pain Severity and Description of Pain Patient Has Paino No Site Locations Pain Management and Medication Current Pain Management: Electronic Signature(s) Signed: 05/28/2019 6:18:44 PM By: Levan Hurst RN, BSN Entered By: Levan Hurst on 05/22/2019  09:47:24 -------------------------------------------------------------------------------- Patient/Caregiver Education Details Patient Name: Date of Service: Doolin, Jasmeen S. 1/6/2021andnbsp9:00 AM Medical Record [id number] Patient Account Number: [id number] Date of Birth/Gender: Treating RN: 08-03-1934 (84 y.o. Tonya Hunter Primary Care Physician: [name] Other Clinician: Referring Physician: Treating Physician/Extender:Stone III, [name], Richard Janett Billow in Treatment: 0 Education Assessment Education Provided To: Patient Education Topics Provided Elevated Blood Sugar/ Impact on Healing: Handouts: Elevated Blood Sugars: How Do They Affect Wound Healing Methods: Explain/Verbal, Printed Responses: Reinforcements needed, State content correctly Welcome To [facility]: Handouts: Welcome [facility] Methods: Explain/Verbal Responses: Reinforcements needed, State content correctly Wound/Skin Impairment: Handouts: Caring for Your Ulcer, Skin Care Do's and Dont's Methods: Explain/Verbal, Printed Responses: Reinforcements needed, State content correctly Electronic Signature(s) Signed: 05/22/2019 6:06:10 PM By: [name] RN, BSN Entered By: [name] on 05/22/2019 10:04:41 -------------------------------------------------------------------------------- Wound Assessment Details Patient Name: Date of Service: Tonya Hunter 05/22/2019 9:00 AM Medical Record [id number] Patient Account Number: [id number] Date of Birth/Sex: Treating RN: 1934-06-21 (84 y.o. Tonya Hunter Primary Care Zandra Lajeunesse: [name] Other Clinician: Referring Brookelynn Hamor: Treating Jaequan Propes/Extender:Stone III, [name], [name] Weeks in Treatment: 0 Wound Status Wound Number: 1 Primary Venous Leg Ulcer Etiology: Wound Location: Left Lower Leg - Anterior, Distal Wound Open Wounding Event: Blister Status: Date Acquired:  02/14/2019 Comorbid Cataracts, Anemia, Angina, Congestive Heart Weeks Of Treatment: 0 History: Failure, Coronary Artery Disease, Clustered Wound: No Hypertension, Myocardial Infarction, Type II Diabetes, Osteoarthritis, Neuropathy Photos Wound Measurements Length: (cm) 0.3 % Reduct Width: (cm) 0.5 % Reduct Depth: (cm) 0.1 Epitheli Area: (cm) 0.118 Tunneli Volume: (cm) 0.012 Undermi Wound Description Classification: Full Thickness Without Exposed Support Foul Od Structures Slough/ Wound Flat and Intact Margin: Exudate Small Amount: Exudate Serosanguineous Type: Exudate red, brown red, brown Color: Wound Bed Granulation Amount: Large (67-100%) Granulation Quality: Red Fascia Expo Necrotic Amount: None Present (0%) Fat Layer ( Tendon Expo Muscle Expo Joint Expos Bone Expose or After Cleansing: No Fibrino No Exposed Structure sed: No Subcutaneous Tissue) Exposed: Yes sed: No sed: No ed: No d: No ion in Area: 0% ion in Volume: 0% alization: None ng: No ning: No Electronic Signature(s) Signed: 05/23/2019 4:21:35 PM By: Mikeal Hawthorne EMT/HBOT Signed: 05/28/2019 6:18:44 PM By: Levan Hurst RN, BSN Entered By: Mikeal Hawthorne on 05/23/2019 09:37:11 -------------------------------------------------------------------------------- Wound Assessment Details Patient Name: Date of Service: Tonya Hunter 05/22/2019 9:00 AM Medical Record [id number] Patient Account Number: [id number] Date of Birth/Sex: Treating RN: February 24, 1935 (85 y.o. Tonya Hunter Primary Care Kijana Estock: [name] Other Clinician: Referring Destenie Ingber: Treating Yunis Voorheis/Extender:Stone III,  [name], [name] Weeks in Treatment: 0 Wound Status Wound Number: 2 Primary Diabetic Wound/Ulcer of the Lower Extremity Etiology: Wound Location: Right Toe Great - Lateral Wound Open Wounding Event: Blister Status: Date Acquired: 05/22/2019 Comorbid Cataracts, Anemia, Angina, Congestive  Heart Weeks Of Treatment: 0 History: Failure, Coronary Artery Disease, Clustered Wound: No Hypertension, Myocardial Infarction, Type II Diabetes, Osteoarthritis, Neuropathy Photos Wound Measurements Length: (cm) 2.3 Width: (cm) 4 Depth: (cm) 0.1 Area: (cm) 7.226 Volume: (cm) 0.723 Wound Description Classification: Unable to visualize wound bed Wound Margin: Flat and Intact Exudate Amount: None Present Wound Bed Granulation Amount: None Present (0%) Necrotic Amount: None Present (0%) ter Cleansing: No no No Exposed Structure ed: No ubcutaneous Tissue) Exposed: No ed: No ed: No d: No : No % Reduction in Area: 0% % Reduction in Volume: 0% Epithelialization: None Tunneling: No Undermining: No Foul Odor Af Slough/Fibri Fascia Expos Fat Layer (S Tendon Expos Muscle Expos Joint Expose Bone Exposed Electronic Signature(s) Signed: 05/23/2019 4:21:35 PM By: Mikeal Hawthorne EMT/HBOT Signed: 05/28/2019 6:18:44 PM By: Levan Hurst RN, BSN Entered By: Mikeal Hawthorne on 05/23/2019 09:48:19 -------------------------------------------------------------------------------- Vitals Details Patient Name: Date of Service: Tonya Hunter. 05/22/2019 9:00 AM Medical Record [id number] Patient Account Number: [id number] Date of Birth/Sex: Treating RN: 03-17-1935 (85 y.o. Tonya Hunter Primary Care Athira Janowicz: [name] Other Clinician: Referring Nellie Chevalier: Treating Javona Bergevin/Extender:Stone III, [name], [name] Weeks in Treatment: 0 Vital Signs Time Taken: 09:10 Temperature (F): 97.8 Height (in): 66 Pulse (bpm): 70 Source: Stated Respiratory Rate (breaths/min): 16 Weight (lbs): 164 Blood Pressure (mmHg): 114/52 Source: Stated Capillary Blood Glucose (mg/dl): 159 Body Mass Index (BMI): 26.5 Reference Range: 80 - 120 mg / dl Notes glucose per pt report Electronic Signature(s) Signed: 05/28/2019 6:18:44 PM By: Levan Hurst RN, BSN Entered By:  Levan Hurst on 05/22/2019 09:13:52

## 2019-05-28 NOTE — Progress Notes (Signed)
Tonya Hunter, Tonya Hunter ([id number]) Visit Report for 05/22/2019 Chief Complaint Document Details Patient Name: Date of Service: Tonya Hunter, Tonya Hunter 05/22/2019 9:00 AM Medical Record [id number] Patient Account Number: [id number] Date of Birth/Sex: Treating RN: July 15, 1934 (85 y.o. F) Primary Care Provider: [name] Other Clinician: Referring Provider: Treating Provider/Extender:Stone III, [name], [name] Weeks in Treatment: 0 Information Obtained from: Patient Chief Complaint Left LE Ulcers and right 1st toe ulcer Electronic Signature(s) Signed: 05/22/2019 10:00:31 AM By: Worthy Keeler PA-C Entered By: Worthy Keeler on 05/22/2019 10:00:31 -------------------------------------------------------------------------------- Debridement Details Patient Name: Date of Service: [name] 05/22/2019 9:00 AM Medical Record [id number] Patient Account Number: [id number] Date of Birth/Sex: Treating RN: February 10, 1935 (84 y.o. [name] Primary Care Provider: [name] Other Clinician: Referring Provider: Treating Provider/Extender:Stone III, [name], [name] Weeks in Treatment: 0 Debridement Performed for Wound #2 Right,Lateral Toe Great Assessment: Performed By: Physician Worthy Keeler, PA Debridement Type: Debridement Severity of Tissue Pre Fat layer exposed Debridement: Level of Consciousness (Pre- Awake and Alert procedure): Pre-procedure Verification/Time Out Taken: Yes - 10:10 Start Time: 10:12 Total Area Debrided (L x W): 2.2 (cm) x 3.4 (cm) = 7.48 (cm) Tissue and other material Viable, Non-Viable, Callus, Slough, Subcutaneous, Skin: Epidermis, Slough debrided: Level: Skin/Subcutaneous Tissue Debridement Description: Excisional Instrument: Curette, Forceps, Scissors Specimen: Swab, Number of Specimens Taken: 1 Bleeding: Minimum Hemostasis Achieved: Pressure End Time: 10:18 Procedural Pain: 0 Post Procedural Pain: 0 Response  to Treatment: Procedure was tolerated well Level of Consciousness Awake and Alert (Post-procedure): Post Debridement Measurements of Total Wound Length: (cm) 2.2 Width: (cm) 1.9 Depth: (cm) 0.2 Volume: (cm) 0.657 Character of Wound/Ulcer Post Improved Debridement: Severity of Tissue Post Debridement: Fat layer exposed Post Procedure Diagnosis Same as Pre-procedure Electronic Signature(s) Signed: 05/22/2019 6:06:10 PM By: [name] RN, BSN Signed: 05/27/2019 4:31:37 PM By: Worthy Keeler PA-C Entered By: [name] on 05/22/2019 10:20:49 -------------------------------------------------------------------------------- HPI Details Patient Name: Date of Service: [name] 05/22/2019 9:00 AM Medical Record [id number] Patient Account Number: [id number] Date of Birth/Sex: Treating RN: 08/05/1934 (85 y.o. F) Primary Care Provider: [name] Other Clinician: Referring Provider: Treating Provider/Extender:Stone III, [name], [name] Weeks in Treatment: 0 History of Present Illness HPI Description: 05/22/2019 patient is seen today in regard to her bilateral lower extremities this was a referral from her primary care provider they were concerned about areas of bruising and contusion that happened on a regular basis. With that being said the patient really has an issue with her right great toe where she has a significant blister that wraps around to the underside of the toe that has me more concerned to be perfectly honest than the legs themselves do they seem to be doing quite well. I explained in regard to the legs she is good to have ongoing and continued issues with blisters based on the fragility of her skin as long as she is on the Plavix and aspirin this is likely to be an ongoing issue. Honestly what she is going need to do is protect that area. In regard to the toe however that started 2 days ago and she tells me it seems to be getting worse each  day. No fevers, chills, nausea, vomiting, or diarrhea. Electronic Signature(s) Signed: 05/22/2019 10:26:19 AM By: Worthy Keeler PA-C Entered By: Worthy Keeler on 05/22/2019 10:26:19 -------------------------------------------------------------------------------- Physical Exam Details Patient Name: Date of Service: Tonya Hunter, Tonya Hunter 05/22/2019 9:00 AM Medical Record [id number] Patient Account Number:  [id number] Date of Birth/Sex: Treating RN: 01/04/1935 (85 y.o. F) Primary Care Provider: [name] Other Clinician: Referring Provider: Treating Provider/Extender:Stone III, [name], [name] Weeks in Treatment: 0 Constitutional sitting or standing blood pressure is within target range for patient.. pulse regular and within target range for patient.Marland Kitchen respirations regular, non-labored and within target range for patient.Marland Kitchen temperature within target range for patient.. Well-nourished and well-hydrated in no acute distress. Eyes conjunctiva clear no eyelid edema noted. pupils equal round and reactive to light and accommodation. Ears, Nose, Mouth, and Throat no gross abnormality of ear auricles or external auditory canals. normal hearing noted during conversation. mucus membranes moist. Respiratory normal breathing without difficulty. clear to auscultation bilaterally. Cardiovascular regular rate and rhythm with normal S1, S2. 2+ dorsalis pedis/posterior tibialis pulses. no clubbing, cyanosis, significant edema, <3 sec cap refill. Gastrointestinal (GI) soft, non-tender, non-distended, +BS. no ventral hernia noted. Musculoskeletal Patient unable to walk without assistance. no significant deformity or arthritic changes, no loss or range of motion, no clubbing. Psychiatric this patient is able to make decisions and demonstrates good insight into disease process. Alert and Oriented x 3. pleasant and cooperative. Notes Upon inspection today patient's wound bed actually  showed signs of fairly good epithelization in regard to her legs I do not see anything that really has me concerned in this regard. Fortunately there is no evidence of active infection at this point. No fevers, chills, nausea, vomiting, or diarrhea. With regard to the great toe on the right she has a significant medial blood blister that is going require some sharp debridement. With that being said the patient tells me that she is not having any pain. I did actually perform sharp debridement to remove the callus skin and as well as some slough and necrotic debris down to good subcutaneous tissue there was some purulent drainage noted as well that was sent for culture. Once I cleaned away this area however it did appear to be doing better we obtained the culture after clearing out the purulent drainage as best I could. Nonetheless I do believe this is good to be her biggest issue we need to get this healed effectively. Electronic Signature(s) Signed: 05/22/2019 10:27:29 AM By: Worthy Keeler PA-C Entered By: Worthy Keeler on 05/22/2019 10:27:29 -------------------------------------------------------------------------------- Physician Orders Details Patient Name: Date of Service: [name] 05/22/2019 9:00 AM Medical Record [id number] Patient Account Number: [id number] Date of Birth/Sex: Treating RN: [birth year] (84 y.o. [name] Primary Care Provider: [name] Other Clinician: Referring Provider: Treating Provider/Extender:Stone III, [name], [name] Weeks in Treatment: 0 Verbal / Phone Orders: No Diagnosis Coding ICD-10 Coding Code Description I87.2 Venous insufficiency (chronic) (peripheral) L97.822 Non-pressure chronic ulcer of other part of left lower leg with fat layer exposed E11.622 Type 2 diabetes mellitus with other skin ulcer L97.512 Non-pressure chronic ulcer of other part of right foot with fat layer exposed I10 Essential (primary)  hypertension I50.42 Chronic combined systolic (congestive) and diastolic (congestive) heart failure I25.10 Atherosclerotic heart disease of native coronary artery without angina pectoris Follow-up Appointments Return Appointment in 1 week. Dressing Change Frequency Wound #1 Left,Distal,Anterior Lower Leg Change Dressing every other day. Wound #2 Right,Lateral Toe Great Change Dressing every other day. Wound Cleansing Wound #1 Left,Distal,Anterior Lower Leg May shower and wash wound with soap and water. Wound #2 Right,Lateral Toe Great May shower and wash wound with soap and water. Primary Wound Dressing Wound #2 Right,Lateral Toe Great Calcium Alginate with Silver Wound #1 Left,Distal,Anterior  Lower Leg Other: - triple antibiotic ointment Secondary Dressing Wound #1 Left,Distal,Anterior Lower Leg Foam Border - or bandaid Wound #2 Right,Lateral Toe Great Foam - donut Kerlix/Rolled Gauze Dry Gauze Laboratory Bacteria identified in Unspecified specimen by Anaerobe culture (MICRO) - right great toe LOINC Code: 350-0 Convenience Name: Anerobic culture Electronic Signature(s) Signed: 05/22/2019 6:06:10 PM By: [name] RN, BSN Signed: 05/27/2019 4:31:37 PM By: Worthy Keeler PA-C Entered By: [name] on 05/22/2019 10:19:09 -------------------------------------------------------------------------------- Problem List Details Patient Name: Date of Service: [name] 05/22/2019 9:00 AM Medical Record [id number] Patient Account Number: [id number] Date of Birth/Sex: Treating RN: [birth year] (85 y.o. F) Primary Care Provider: [name] Other Clinician: Referring Provider: Treating Provider/Extender:Stone III, [name], [name] Weeks in Treatment: 0 Active Problems ICD-10 Evaluated Encounter Code Description Active Date Today Diagnosis I87.2 Venous insufficiency (chronic) (peripheral) 05/22/2019 No Yes L97.822 Non-pressure chronic ulcer of other  part of left lower 05/22/2019 No Yes leg with fat layer exposed E11.622 Type 2 diabetes mellitus with other skin ulcer 05/22/2019 No Yes L97.512 Non-pressure chronic ulcer of other part of right foot 05/22/2019 No Yes with fat layer exposed I10 Essential (primary) hypertension 05/22/2019 No Yes I50.42 Chronic combined systolic (congestive) and diastolic 12/22/3808 No Yes (congestive) heart failure I25.10 Atherosclerotic heart disease of native coronary artery 05/22/2019 No Yes without angina pectoris Inactive Problems Resolved Problems Electronic Signature(s) Signed: 05/22/2019 10:00:02 AM By: Worthy Keeler PA-C Entered By: Worthy Keeler on 05/22/2019 10:00:01 -------------------------------------------------------------------------------- Progress Note Details Patient Name: Date of Service: [name] 05/22/2019 9:00 AM Medical Record [id number] Patient Account Number: [id number] Date of Birth/Sex: Treating RN: April 30, 1935 (84 y.o. F) Primary Care Provider: [name] Other Clinician: Referring Provider: Treating Provider/Extender:Stone III, [name], [name] Weeks in Treatment: 0 Subjective Chief Complaint Information obtained from Patient Left LE Ulcers and right 1st toe ulcer History of Present Illness (HPI) 05/22/2019 patient is seen today in regard to her bilateral lower extremities this was a referral from her primary care provider they were concerned about areas of bruising and contusion that happened on a regular basis. With that being said the patient really has an issue with her right great toe where she has a significant blister that wraps around to the underside of the toe that has me more concerned to be perfectly honest than the legs themselves do they seem to be doing quite well. I explained in regard to the legs she is good to have ongoing and continued issues with blisters based on the fragility of her skin as long as she is on the Plavix and aspirin  this is likely to be an ongoing issue. Honestly what she is going need to do is protect that area. In regard to the toe however that started 2 days ago and she tells me it seems to be getting worse each day. No fevers, chills, nausea, vomiting, or diarrhea. Patient History Information obtained from Patient. Allergies penicillin (Severity: Moderate, Reaction: swelling), Demerol (Severity: Moderate, Reaction: nausea/vomiting) Family History Cancer - Mother, Heart Disease - Father, No family history of Hereditary Spherocytosis, Hypertension, Kidney Disease, Lung Disease, Seizures, Stroke, Thyroid Problems, Tuberculosis. Social History Never smoker, Marital Status - Married, Alcohol Use - Never, Drug Use - No History, Caffeine Use - Rarely. Medical History Eyes Patient has history of Cataracts - both eyes Hematologic/Lymphatic Patient has history of Anemia Cardiovascular Patient has history of Angina, Congestive Heart Failure, Coronary Artery Disease, Hypertension, Myocardial Infarction - 2012 Endocrine Patient has  history of Type II Diabetes Musculoskeletal Patient has history of Osteoarthritis Denies history of Osteomyelitis Neurologic Patient has history of Neuropathy Patient is treated with Oral Agents. Blood sugar is tested. 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 Ear/Nose/Mouth/Throat Denies complaints or symptoms of Chronic sinus problems or rhinitis. Respiratory Denies complaints or symptoms of Chronic or frequent coughs, Shortness of Breath. Gastrointestinal Denies complaints or symptoms of Frequent diarrhea, Nausea, Vomiting. Genitourinary Denies complaints or symptoms of Frequent urination. Integumentary  (Skin) Complains or has symptoms of Wounds - wound on left lower leg and right great toe. Psychiatric Denies complaints or symptoms of Claustrophobia, Suicidal. Objective Constitutional sitting or standing blood pressure is within target range for patient.. pulse regular and within target range for patient.Marland Kitchen respirations regular, non-labored and within target range for patient.Marland Kitchen temperature within target range for patient.. Well-nourished and well-hydrated in no acute distress. Vitals Time Taken: 9:10 AM, Height: 66 in, Source: Stated, Weight: 164 lbs, Source: Stated, BMI: 26.5, Temperature: 97.8 F, Pulse: 70 bpm, Respiratory Rate: 16 breaths/min, Blood Pressure: 114/52 mmHg, Capillary Blood Glucose: 159 mg/dl. General Notes: glucose per pt report Eyes conjunctiva clear no eyelid edema noted. pupils equal round and reactive to light and accommodation. Ears, Nose, Mouth, and Throat no gross abnormality of ear auricles or external auditory canals. normal hearing noted during conversation. mucus membranes moist. Respiratory normal breathing without difficulty. clear to auscultation bilaterally. Cardiovascular regular rate and rhythm with normal S1, S2. 2+ dorsalis pedis/posterior tibialis pulses. no clubbing, cyanosis, significant edema, Gastrointestinal (GI) soft, non-tender, non-distended, +BS. no ventral hernia noted. Musculoskeletal Patient unable to walk without assistance. no significant deformity or arthritic changes, no loss or range of motion, no clubbing. Psychiatric this patient is able to make decisions and demonstrates good insight into disease process. Alert and Oriented x 3. pleasant and cooperative. General Notes: Upon inspection today patient's wound bed actually showed signs of fairly good epithelization in regard to her legs I do not see anything that really has me concerned in this regard. Fortunately there is no evidence of active infection at this point. No  fevers, chills, nausea, vomiting, or diarrhea. With regard to the great toe on the right she has a significant medial blood blister that is going require some sharp debridement. With that being said the patient tells me that she is not having any pain. I did actually perform sharp debridement to remove the callus skin and as well as some slough and necrotic debris down to good subcutaneous tissue there was some purulent drainage noted as well that was sent for culture. Once I cleaned away this area however it did appear to be doing better we obtained the culture after clearing out the purulent drainage as best I could. Nonetheless I do believe this is good to be her biggest issue we need to get this healed effectively. Integumentary (Hair, Skin) Wound #1 status is Open. Original cause of wound was Blister. The wound is located on the [facility] Lower Leg. The wound measures 0.3cm length x 0.5cm width x 0.1cm depth; 0.118cm^2 area and 0.012cm^3 volume. There is Fat Layer (Subcutaneous Tissue) Exposed exposed. There is no tunneling or undermining noted. There is a small amount of serosanguineous drainage noted. The wound margin is flat and intact. There is large (67- 100%) red granulation within the wound bed. There is no  necrotic tissue within the wound bed. Wound #2 status is Open. Original cause of wound was Blister. The wound is located on the [employer] The wound measures 2.3cm length x 4cm width x 0.1cm depth; 7.226cm^2 area and 0.723cm^3 volume. There is no tunneling or undermining noted. There is a none present amount of drainage noted. The wound margin is flat and intact. There is no granulation within the wound bed. There is no necrotic tissue within the wound bed. Assessment Active Problems ICD-10 Venous insufficiency (chronic) (peripheral) Non-pressure chronic ulcer of other part of left lower leg with fat layer exposed Type 2 diabetes mellitus with other skin  ulcer Non-pressure chronic ulcer of other part of right foot with fat layer exposed Essential (primary) hypertension Chronic combined systolic (congestive) and diastolic (congestive) heart failure Atherosclerotic heart disease of native coronary artery without angina pectoris Procedures Wound #2 Pre-procedure diagnosis of Wound #2 is a Diabetic Wound/Ulcer of the Lower Extremity located on the Right,Lateral Toe Great .Severity of Tissue Pre Debridement is: Fat layer exposed. There was a Excisional Skin/Subcutaneous Tissue Debridement with a total area of 7.48 sq cm performed by Worthy Keeler, PA. With the following instrument(s): Curette, Forceps, and Scissors to remove Viable and Non-Viable tissue/material. Material removed includes Callus, Subcutaneous Tissue, Slough, and Skin: Epidermis. 1 specimen was taken by a Swab and sent to the lab per facility protocol. A time out was conducted at 10:10, prior to the start of the procedure. A Minimum amount of bleeding was controlled with Pressure. The procedure was tolerated well with a pain level of 0 throughout and a pain level of 0 following the procedure. Post Debridement Measurements: 2.2cm length x 1.9cm width x 0.2cm depth; 0.657cm^3 volume. Character of Wound/Ulcer Post Debridement is improved. Severity of Tissue Post Debridement is: Fat layer exposed. Post procedure Diagnosis Wound #2: Same as Pre-Procedure Plan Follow-up Appointments: Return Appointment in 1 week. Dressing Change Frequency: Wound #1 Left,Distal,Anterior Lower Leg: Change Dressing every other day. Wound #2 Right,Lateral Toe Great: Change Dressing every other day. Wound Cleansing: Wound #1 Left,Distal,Anterior Lower Leg: May shower and wash wound with soap and water. Wound #2 Right,Lateral Toe Great: May shower and wash wound with soap and water. Primary Wound Dressing: Wound #2 Right,Lateral Toe Great: Calcium Alginate with Silver Wound #1 Left,Distal,Anterior  Lower Leg: Other: - triple antibiotic ointment Secondary Dressing: Wound #1 Left,Distal,Anterior Lower Leg: Foam Border - or bandaid Wound #2 Right,Lateral Toe Great: Foam - donut Kerlix/Rolled Gauze Dry Gauze Laboratory ordered were: Anerobic culture - right great toe 1. I would recommend currently that we go ahead and initiate treatment with a silver alginate dressing to the great toe. I think that is good to be appropriate for her at this point. I am also going to send out a culture on this in order to evaluate for any signs of infection will initiate antibiotic therapy if needed based on the results. 2. With regard to the lower extremity this appears to be more of just contusions that occurred due to the fragility of her skin and the fact she is on Plavix and aspirin. Obviously I think she has to continue on these medications but this is can be something she notices off and on again there is really nothing to do for that other than protect the legs from injury. Even mild injuries. 3. In regard to the toe she needs to try as much as possible to offload I would not want to use a surgical stop shoe  but unfortunately this has not been to work for her due to the fact that she actually has significant issues with foot drop although she has had surgery for this on the left and she is doing much better she is not fully recovered I am still concerned about how she would walk in a postop surgical shoe she is using a walker at this point. We will see patient back for reevaluation in 1 week here in the clinic. If anything worsens or changes patient will contact our office for additional recommendations. Electronic Signature(s) Signed: 05/22/2019 10:28:53 AM By: Worthy Keeler PA-C Entered By: Worthy Keeler on 05/22/2019 10:28:53 -------------------------------------------------------------------------------- HxROS Details Patient Name: Date of Service: [name] 05/22/2019 9:00  AM Medical Record [id number] Patient Account Number: [id number] Date of Birth/Sex: Treating RN: December 14, 1934 (84 y.o. [name] Primary Care Provider: [name] Other Clinician: Referring Provider: Treating Provider/Extender:Stone III, [name], [name] Weeks in Treatment: 0 Information Obtained From Patient Constitutional Symptoms (General Health) Complaints and Symptoms: Negative for: Fatigue; Fever; Chills; Marked Weight Change Ear/Nose/Mouth/Throat Complaints and Symptoms: Negative for: Chronic sinus problems or rhinitis Respiratory Complaints and Symptoms: Negative for: Chronic or frequent coughs; Shortness of Breath Gastrointestinal Complaints and Symptoms: Negative for: Frequent diarrhea; Nausea; Vomiting Genitourinary Complaints and Symptoms: Negative for: Frequent urination Integumentary (Skin) Complaints and Symptoms: Positive for: Wounds - wound on left lower leg and right great toe Psychiatric Complaints and Symptoms: Negative for: Claustrophobia; Suicidal Eyes Medical History: Positive for: Cataracts - both eyes Hematologic/Lymphatic Medical History: Positive for: Anemia Cardiovascular Medical History: Positive for: Angina; Congestive Heart Failure; Coronary Artery Disease; Hypertension; Myocardial Infarction - 2012 Past Medical History Notes: CVA 10/2018, Hyperlipidemia Endocrine Medical History: Positive for: Type II Diabetes Past Medical History Notes: Hypothyroidism Time with diabetes: over 20 years Treated with: Oral agents Blood sugar tested every day: Yes Tested : once a day Immunological Musculoskeletal Medical History: Positive for: Osteoarthritis Negative for: Osteomyelitis Past Medical History Notes: drop foot, carpal tunnel Neurologic Medical History: Positive for: Neuropathy Oncologic HBO Extended History Items Eyes: Cataracts Immunizations Pneumococcal Vaccine: Received Pneumococcal Vaccination:  Yes Tetanus Vaccine: Last tetanus shot: 02/13/2019 Implantable Devices None Family and Social History Cancer: Yes - Mother; Heart Disease: Yes - Father; Hereditary Spherocytosis: No; Hypertension: No; Kidney Disease: No; Lung Disease: No; Seizures: No; Stroke: No; Thyroid Problems: No; Tuberculosis: No; Never smoker; Marital Status - Married; Alcohol Use: Never; Drug Use: No History; Caffeine Use: Rarely; Financial Concerns: No; Food, Clothing or Shelter Needs: No; Support System Lacking: No; Transportation Concerns: No Electronic Signature(s) Signed: 05/27/2019 4:31:37 PM By: Worthy Keeler PA-C Signed: 05/28/2019 6:18:44 PM By: Levan Hurst RN, BSN Entered By: Levan Hurst on 05/22/2019 09:53:12 -------------------------------------------------------------------------------- [place] Details Patient Name: Date of Service: [name] 05/22/2019 Medical Record [id number] Patient Account Number: [id number] Date of Birth/Sex: Treating RN: [birth year] (85 y.o. [name] Primary Care Provider: [name] Other Clinician: Referring Provider: Treating Provider/Extender:Stone III, [name], [name] Weeks in Treatment: 0 Diagnosis Coding ICD-10 Codes Code Description I87.2 Venous insufficiency (chronic) (peripheral) L97.822 Non-pressure chronic ulcer of other part of left lower leg with fat layer exposed E11.622 Type 2 diabetes mellitus with other skin ulcer L97.512 Non-pressure chronic ulcer of other part of right foot with fat layer exposed I10 Essential (primary) hypertension I50.42 Chronic combined systolic (congestive) and diastolic (congestive) heart failure I25.10 Atherosclerotic heart disease of native coronary artery without angina pectoris Facility Procedures CPT4 Code Description: 51700174 99213 - WOUND  CARE VISIT-LEV 3 EST PT Modifier: 25 Quantity: 1 CPT4 Code Description: 24814439 11042 - DEB SUBQ TISSUE 20 SQ CM/< ICD-10 Diagnosis  Description L97.512 Non-pressure chronic ulcer of other part of right foot with Modifier: fat layer e Quantity: 1 xposed Physician Procedures CPT4 Code Description: 2659978 WC PHYS LEVEL 3 NEW PT ICD-10 Diagnosis Description I87.2 Venous insufficiency (chronic) (peripheral) L97.822 Non-pressure chronic ulcer of other part of left lower leg E11.622 Type 2 diabetes mellitus with other skin ulcer  L97.512 Non-pressure chronic ulcer of other part of right foot wit Modifier: 25 with fat la h fat layer Quantity: 1 yer exposed exposed CPT4 Code Description: 7765486 11042 - WC PHYS SUBQ TISS 20 SQ CM ICD-10 Diagnosis Description L97.512 Non-pressure chronic ulcer of other part of right foot with f Modifier: at layer exp Quantity: 1 osed Electronic Signature(s) Signed: 05/22/2019 10:29:13 AM By: Worthy Keeler PA-C Entered By: Worthy Keeler on 05/22/2019 10:29:12

## 2019-05-28 NOTE — Progress Notes (Addendum)
Tonya Hunter, Tonya Hunter ([id number]) Visit Report for 05/27/2019 Arrival Information Details Patient Name: Date of Service: [name], [name] 05/27/2019 11:15 AM Medical Record [id number] Patient Account Number: [id number] Date of Birth/Sex: Treating RN: 07/23/1934 (85 y.o. [name] Primary Care Eyan Hagood: [name] Other Clinician: Referring Toye Rouillard: Treating Tariah Transue/Extender:[name], [name], [name] in Treatment: 0 Visit Information History Since Last Visit Added or deleted any medications: No Patient Arrived: [name] Any new allergies or adverse reactions: No Arrival Time: 12:31 Had a fall or experienced change in No Accompanied By: self activities of daily living that may affect Transfer Assistance: None risk of falls: Patient Identification Verified: Yes Signs or symptoms of abuse/neglect since last No Secondary Verification Process Yes visito Completed: Hospitalized since last visit: No Patient Requires Transmission- No Implantable device outside of the clinic excluding No Based Precautions: cellular tissue based products placed in the center Patient Has Alerts: Yes since last visit: Patient Alerts: Patient on Blood Has Dressing in Place as Prescribed: Yes Thinner Pain Present Now: Yes L ABI non compressible Electronic Signature(s) Signed: 05/28/2019 6:12:59 PM By: [name] RN, BSN Entered By: [name] on 05/27/2019 12:34:01 -------------------------------------------------------------------------------- Clinic Level of Care Assessment Details Patient Name: Date of Service: Tonya Hunter 05/27/2019 11:15 AM Medical Record [id number] Patient Account Number: [id number] Date of Birth/Sex: Treating RN: 1935/03/11 (85 y.o. [name] Primary Care Daysen Gundrum: [name] Other Clinician: Referring Tayveon Lombardo: Treating Beatrix Breece/Extender:[name], [name], [name] in Treatment: 0 Clinic  Level of Care Assessment Items TOOL 4 Quantity Score []  - Use when only an EandM is performed on FOLLOW-UP visit 0 ASSESSMENTS - Nursing Assessment / Reassessment X - Reassessment of Co-morbidities (includes updates in patient status) 1 10 X - Reassessment of Adherence to Treatment Plan 1 5 ASSESSMENTS - Wound and Skin Assessment / Reassessment X - Simple Wound Assessment / Reassessment - one wound 1 5 []  - Complex Wound Assessment / Reassessment - multiple wounds 0 []  - Dermatologic / Skin Assessment (not related to wound area) 0 ASSESSMENTS - Focused Assessment []  - Circumferential Edema Measurements - multi extremities 0 []  - Nutritional Assessment / Counseling / Intervention 0 []  - Lower Extremity Assessment (monofilament, tuning fork, pulses) 0 []  - Peripheral Arterial Disease Assessment (using hand held doppler) 0 ASSESSMENTS - Ostomy and/or Continence Assessment and Care []  - Incontinence Assessment and Management 0 []  - Ostomy Care Assessment and Management (repouching, etc.) 0 PROCESS - Coordination of Care X - Simple Patient / Family Education for ongoing care 1 15 []  - Complex (extensive) Patient / Family Education for ongoing care 0 X - Staff obtains Programmer, systems, Records, Test Results / Process Orders 1 10 []  - Staff telephones HHA, Nursing Homes / Clarify orders / etc 0 []  - Routine Transfer to another Facility (non-emergent condition) 0 []  - Routine Hospital Admission (non-emergent condition) 0 []  - New Admissions / Biomedical engineer / Ordering NPWT, Apligraf, etc. 0 []  - Emergency Hospital Admission (emergent condition) 0 X - Simple Discharge Coordination 1 10 []  - Complex (extensive) Discharge Coordination 0 PROCESS - Special Needs []  - Pediatric / Minor Patient Management 0 []  - Isolation Patient Management 0 []  - Hearing / Language / Visual special needs 0 []  - Assessment of Community assistance (transportation, D/C planning, etc.) 0 []  - Additional assistance  / Altered mentation 0 []  - Support Surface(s) Assessment (bed, cushion, seat, etc.) 0 INTERVENTIONS - Wound Cleansing / Measurement X - Simple Wound Cleansing - one wound 1  5 []  - Complex Wound Cleansing - multiple wounds 0 []  - Wound Imaging (photographs - any number of wounds) 0 []  - Wound Tracing (instead of photographs) 0 []  - Simple Wound Measurement - one wound 0 []  - Complex Wound Measurement - multiple wounds 0 INTERVENTIONS - Wound Dressings X - Small Wound Dressing one or multiple wounds 1 10 []  - Medium Wound Dressing one or multiple wounds 0 []  - Large Wound Dressing one or multiple wounds 0 X - Application of Medications - topical 1 5 []  - Application of Medications - injection 0 INTERVENTIONS - Miscellaneous []  - External ear exam 0 []  - Specimen Collection (cultures, biopsies, blood, body fluids, etc.) 0 []  - Specimen(s) / Culture(s) sent or taken to Lab for analysis 0 []  - Patient Transfer (multiple staff / Civil Service fast streamer / Similar devices) 0 []  - Simple Staple / Suture removal (25 or less) 0 []  - Complex Staple / Suture removal (26 or more) 0 []  - Hypo / Hyperglycemic Management (close monitor of Blood Glucose) 0 []  - Ankle / Brachial Index (ABI) - do not check if billed separately 0 X - Vital Signs 1 5 Has the patient been seen at the hospital within the last three years: Yes Total Score: 80 Level Of Care: New/Established - Level 3 Electronic Signature(s) Signed: 05/29/2019 6:34:17 PM By: [name] RN, BSN Entered By: [name] on 05/29/2019 18:01:23 -------------------------------------------------------------------------------- Encounter Discharge Information Details Patient Name: Date of Service: Tonya Hunter 05/27/2019 11:15 AM Medical Record [id number] Patient Account Number: [id number] Date of Birth/Sex: Treating RN: May 12, 1935 (85 y.o. [name] Primary Care Rodrick Payson: [name] Other Clinician: Referring Zaylin Pistilli:  Treating Renada Cronin/Extender:[name], [name], [name] in Treatment: 0 Encounter Discharge Information Items Discharge Condition: Stable Ambulatory Status: Walker Discharge Destination: Home Transportation: Private Auto Accompanied By: self Schedule Follow-up Appointment: Yes Clinical Summary of Care: Patient Declined Electronic Signature(s) Signed: 05/29/2019 6:34:17 PM By: [name] RN, BSN Entered By: [name] on 05/29/2019 18:02:27 -------------------------------------------------------------------------------- Patient/Caregiver Education Details Patient Name: Date of Service: Tonya Hunter 1/11/2021andnbsp11:15 AM Medical Record Patient Account Number: [id number] Number: Treating RN: [name] Date of Birth/Gender: 10/11/1934 (85 y.o. Other Clinician: F) Treating Linton Ham Primary Care Physician: [name] Physician/Extender: Referring Physician: Denyse Dago in Treatment: 0 Education Assessment Education Provided To: Patient Education Topics Provided Wound/Skin Impairment: Methods: Explain/Verbal Responses: Reinforcements needed, State content correctly Electronic Signature(s) Signed: 05/29/2019 6:34:17 PM By: [name] RN, BSN Entered By: [name] on 05/29/2019 18:02:01 -------------------------------------------------------------------------------- Wound Assessment Details Patient Name: Date of Service: Tonya Hunter 05/27/2019 11:15 AM Medical Record [id number] Patient Account Number: [id number] Date of Birth/Sex: Treating RN: 1934-11-12 (85 y.o. [name] Primary Care Coraima Tibbs: [name] Other Clinician: Referring Rae Plotner: Treating Madolin Twaddle/Extender:[name], [name], [name] Weeks in Treatment: 0 Wound Status Wound Number: 2 Primary Diabetic Wound/Ulcer of the Lower Extremity Etiology: Wound Location: Right Toe Great - Medial Wound  Open Wounding Event: Blister Status: Date Acquired: 05/22/2019 Comorbid Cataracts, Anemia, Angina, Congestive Heart Weeks Of Treatment: 0 History: Failure, Coronary Artery Disease, Clustered Wound: No Hypertension, Myocardial Infarction, Type II Diabetes, Osteoarthritis, Neuropathy Wound Measurements Length: (cm) 2.3 % Reduction in Ar Width: (cm) 1 % Reduction in Vo Depth: (cm) 0.1 Epithelialization Area: (cm) 1.806 Tunneling: Volume: (cm) 0.181 Undermining: Wound Description Classification: Unable to visualize wound bed Foul Odor After Wound Margin: Flat and Intact Slough/Fibrino Exudate Amount: Medium Exudate Type: Serosanguineous Exudate Color: red, brown Wound  Bed Granulation Amount: Large (67-100%) Granulation Quality: Red Fascia Exposed: Necrotic Amount: None Present (0%) Fat Layer (Subcu Tendon Exposed: Muscle Exposed: Joint Exposed: Bone Exposed: Cleansing: No No Exposed Structure No taneous Tissue) Exposed: Yes No No No No ea: 75% lume: 75% : Small (1-33%) No No Treatment Notes Wound #2 (Right, Medial Toe Great) 3. Primary Dressing Applied Calcium Alginate Ag 4. Secondary Dressing Dry Gauze Roll Gauze Foam 5. Secured With Recruitment consultant) Signed: 05/29/2019 6:34:17 PM By: [name] RN, BSN Entered By: [name] on 05/29/2019 18:00:53 -------------------------------------------------------------------------------- [place] Details Patient Name: Date of Service: Tonya Hunter 05/27/2019 11:15 AM Medical Record [id number] Patient Account Number: [id number] Date of Birth/Sex: Treating RN: [birth year] (85 y.o. [name] Primary Care Veldon Wager: [name] Other Clinician: Referring Mikeala Girdler: Treating Jasaiah Karwowski/Extender:[name], [name], [name] in Treatment: 0 Vital Signs Time Taken: 12:34 Temperature (F): 97.7 Height (in): 66 Pulse (bpm): 64 Source: Stated Respiratory Rate  (breaths/min): 18 Weight (lbs): 164 Blood Pressure (mmHg): 126/66 Source: Stated Capillary Blood Glucose (mg/dl): 149 Body Mass Index (BMI): 26.5 Reference Range: 80 - 120 mg / dl Notes glucose per pt report last night Electronic Signature(s) Signed: 05/28/2019 6:12:59 PM By: [name] RN, BSN Entered By: [name] on 05/27/2019 12:35:01

## 2019-05-28 NOTE — Progress Notes (Signed)
Tonya Hunter ([id number]) Visit Report for 05/22/2019 Abuse/Suicide Risk Screen Details Patient Name: Date of Service: Tonya Hunter, Tonya Hunter 05/22/2019 9:00 AM Medical Record [id number] Patient Account Number: [id number] Date of Birth/Sex: Treating RN: 08-04-1934 (84 y.o. [name] Primary Care Kamry Faraci: [name] Other Clinician: Referring Trevar Boehringer: Treating Yanina Knupp/Extender:Stone III, [name], [name] Weeks in Treatment: 0 Abuse/Suicide Risk Screen Items Answer ABUSE RISK SCREEN: Has anyone close to you tried to hurt or harm you recentlyo No Do you feel uncomfortable with anyone in your familyo No Has anyone forced you do things that you didnt want to doo No Electronic Signature(s) Signed: 05/28/2019 6:18:44 PM By: Levan Hurst RN, BSN Entered By: Levan Hurst on 05/22/2019 09:34:08 -------------------------------------------------------------------------------- Activities of Daily Living Details Patient Name: Date of Service: Tonya Hunter, Tonya Hunter 05/22/2019 9:00 AM Medical Record [id number] Patient Account Number: [id number] Date of Birth/Sex: Treating RN: [birth year] (85 y.o. [name] Primary Care Brielynn Sekula: [name] Other Clinician: Referring Anastasya Jewell: Treating Arrington Bencomo/Extender:Stone III, [name], [name] Weeks in Treatment: 0 Activities of Daily Living Items Answer Activities of Daily Living (Please select one for each item) Drive Automobile Completely Able Take Medications Completely Able Use Telephone Completely Able Care for Appearance Completely Able Use Toilet Completely Able Bath / Shower Completely Able Dress Self Completely Able Feed Self Completely Able Walk Completely Able Get In / Out Bed Completely Able Housework Need Assistance Prepare Meals Need Assistance Handle Money Completely Able Shop for Self Need Assistance Electronic Signature(s) Signed: 05/28/2019 6:18:44 PM By: Levan Hurst RN,  BSN Entered By: Levan Hurst on 05/22/2019 09:34:31 -------------------------------------------------------------------------------- Education Screening Details Patient Name: Date of Service: Tonya Hunter 05/22/2019 9:00 AM Medical Record [id number] Patient Account Number: [id number] Date of Birth/Sex: Treating RN: 05/07/1935 (84 y.o. [name] Primary Care Neko Boyajian: [name] Other Clinician: Referring Shaine Mount: Treating Delmar Dondero/Extender:Stone III, [name], Richard Janett Billow in Treatment: 0 Primary Learner Assessed: Patient Learning Preferences/Education Level/Primary Language Learning Preference: Explanation, Printed Material Highest Education Level: High School Preferred Language: English Cognitive Barrier Language Barrier: No Translator Needed: No Memory Deficit: No Emotional Barrier: No Cultural/Religious Beliefs Affecting Medical Care: No Physical Barrier Impaired Vision: No Impaired Hearing: No Decreased Hand dexterity: No Knowledge/Comprehension Knowledge Level: High Comprehension Level: High Ability to understand written High instructions: Ability to understand verbal High instructions: Motivation Anxiety Level: Calm Cooperation: Cooperative Education Importance: Acknowledges Need Interest in Health Problems: Asks Questions Perception: Coherent Willingness to Engage in Self- High Management Activities: Readiness to Engage in Self- High Management Activities: Electronic Signature(s) Signed: 05/28/2019 6:18:44 PM By: Levan Hurst RN, BSN Entered By: Levan Hurst on 05/22/2019 09:34:51 -------------------------------------------------------------------------------- Fall Risk Assessment Details Patient Name: Date of Service: Tonya Hunter. 05/22/2019 9:00 AM Medical Record [id number] Patient Account Number: [id number] Date of Birth/Sex: Treating RN: Jul 09, 1934 (85 y.o. [name] Primary Care Rody Keadle:  [name] Other Clinician: Referring Rhayne Chatwin: Treating Carmina Walle/Extender:Stone III, [name], [name] Weeks in Treatment: 0 Fall Risk Assessment Items Have you had 2 or more falls in the last 12 monthso 0 Yes Have you had any fall that resulted in injury in the last 12 monthso 0 Yes FALLS RISK SCREEN History of falling - immediate or within 3 months 25 Yes Secondary diagnosis (Do you have 2 or more medical diagnoseso) 15 Yes Ambulatory aid None/bed rest/wheelchair/nurse 0 No Crutches/cane/walker 15 Yes Furniture 0 No Intravenous therapy Access/Saline/Heparin Lock 0 No Weak (short steps with or without shuffle, stooped but able to lift head  0 No while walking, may seek support from furniture) Impaired (short steps with shuffle, may have difficulty arising from chair, 0 No head down, impaired balance) Mental Status Oriented to own ability 0 Yes Overestimates or forgets limitations 0 No Risk Level: High Risk Score: 55 Electronic Signature(s) Signed: 05/28/2019 6:18:44 PM By: Levan Hurst RN, BSN Entered By: Levan Hurst on 05/22/2019 09:35:29 -------------------------------------------------------------------------------- Foot Assessment Details Patient Name: Date of Service: Tonya Hunter 05/22/2019 9:00 AM Medical Record [id number] Patient Account Number: [id number] Date of Birth/Sex: Treating RN: 11-28-1934 (85 y.o. [name] Primary Care Mohab Ashby: [name] Other Clinician: Referring Aleksei Goodlin: Treating Adara Kittle/Extender:Stone III, [name], [name] Weeks in Treatment: 0 Foot Assessment Items Site Locations + = Sensation present, - = Sensation absent, C = Callus, U = Ulcer R = Redness, W = Warmth, M = Maceration, PU = Pre-ulcerative lesion F = Fissure, S = Swelling, D = Dryness Assessment Right: Left: Other Deformity: No No Prior Foot Ulcer: No No Prior Amputation: No No Charcot Joint: No No Ambulatory Status:  Ambulatory With Help Assistance Device: Walker Gait: Steady Electronic Signature(s) Signed: 05/28/2019 6:18:44 PM By: Levan Hurst RN, BSN Entered By: Levan Hurst on 05/22/2019 09:37:14 -------------------------------------------------------------------------------- Nutrition Risk Screening Details Patient Name: Date of Service: Tonya Hunter 05/22/2019 9:00 AM Medical Record [id number] Patient Account Number: [id number] Date of Birth/Sex: Treating RN: 1934/10/21 (85 y.o. [name] Primary Care Louann Hopson: [name] Other Clinician: Referring Azlin Zilberman: Treating Usbaldo Pannone/Extender:Stone III, [name], [name] Weeks in Treatment: 0 Height (in): 66 Weight (lbs): 164 Body Mass Index (BMI): 26.5 Nutrition Risk Screening Items Score Screening NUTRITION RISK SCREEN: I have an illness or condition that made me change the kind and/or 2 Yes amount of food I eat I eat fewer than two meals per day 0 No I eat few fruits and vegetables, or milk products 0 No I have three or more drinks of beer, liquor or wine almost every day 0 No I have tooth or mouth problems that make it hard for me to eat 0 No I don't always have enough money to buy the food I need 0 No I eat alone most of the time 0 No I take three or more different prescribed or over-the-counter drugs a day 1 Yes 0 No Without wanting to, I have lost or gained 10 pounds in the last six months I am not always physically able to shop, cook and/or feed myself 2 Yes Nutrition Protocols Good Risk Protocol Provide education on elevated blood sugars and Moderate Risk Protocol 0 impact on wound healing, as applicable High Risk Proctocol Risk Level: Moderate Risk Score: 5 Electronic Signature(s) Signed: 05/28/2019 6:18:44 PM By: Levan Hurst RN, BSN Entered By: Levan Hurst on 05/22/2019 09:35:40

## 2019-05-29 ENCOUNTER — Encounter (HOSPITAL_BASED_OUTPATIENT_CLINIC_OR_DEPARTMENT_OTHER): Payer: Medicare Other | Attending: Physician Assistant | Admitting: Physician Assistant

## 2019-05-29 ENCOUNTER — Other Ambulatory Visit: Payer: Self-pay

## 2019-05-29 ENCOUNTER — Other Ambulatory Visit: Payer: Medicare Other

## 2019-05-29 DIAGNOSIS — E11622 Type 2 diabetes mellitus with other skin ulcer: Secondary | ICD-10-CM | POA: Diagnosis not present

## 2019-05-29 DIAGNOSIS — L97512 Non-pressure chronic ulcer of other part of right foot with fat layer exposed: Secondary | ICD-10-CM | POA: Diagnosis not present

## 2019-05-29 DIAGNOSIS — L97822 Non-pressure chronic ulcer of other part of left lower leg with fat layer exposed: Secondary | ICD-10-CM | POA: Diagnosis not present

## 2019-05-29 DIAGNOSIS — S81801A Unspecified open wound, right lower leg, initial encounter: Secondary | ICD-10-CM | POA: Diagnosis not present

## 2019-05-29 DIAGNOSIS — I110 Hypertensive heart disease with heart failure: Secondary | ICD-10-CM | POA: Diagnosis not present

## 2019-05-29 DIAGNOSIS — I5042 Chronic combined systolic (congestive) and diastolic (congestive) heart failure: Secondary | ICD-10-CM | POA: Diagnosis not present

## 2019-05-29 NOTE — Progress Notes (Addendum)
Tonya Hunter, IGLESIA ([id number]) Visit Report for 05/29/2019 Chief Complaint Document Details Patient Name: Date of Service: Tonya Hunter, ECKROTH 05/29/2019 11:00 AM Medical Record [id number] Patient Account Number: [id number] Date of Birth/Sex: Treating RN: 12-May-1935 (85 y.o. F) Primary Care Provider: [name] Other Clinician: Referring Provider: Treating Provider/Extender:Stone III, [name], [name] Weeks in Treatment: 1 Information Obtained from: Patient Chief Complaint Left LE Ulcers and right 1st toe ulcer Electronic Signature(s) Signed: 05/29/2019 11:41:40 AM By: Worthy Keeler PA-C Entered By: Worthy Keeler on 05/29/2019 11:41:40 -------------------------------------------------------------------------------- HPI Details Patient Name: Date of Service: Tonya Hunter. 05/29/2019 11:00 AM Medical Record [id number] Patient Account Number: [id number] Date of Birth/Sex: Treating RN: [birth year] (85 y.o. F) Primary Care Provider: [name] Other Clinician: Referring Provider: Treating Provider/Extender:Stone III, [name], [name] Weeks in Treatment: 1 History of Present Illness HPI Description: 05/22/2019 patient is seen today in regard to her bilateral lower extremities this was a referral from her primary care provider they were concerned about areas of bruising and contusion that happened on a regular basis. With that being said the patient really has an issue with her right great toe where she has a significant blister that wraps around to the underside of the toe that has me more concerned to be perfectly honest than the legs themselves do they seem to be doing quite well. I explained in regard to the legs she is good to have ongoing and continued issues with blisters based on the fragility of her skin as long as she is on the Plavix and aspirin this is likely to be an ongoing issue. Honestly what she is going need to do is protect that  area. In regard to the toe however that started 2 days ago and she tells me it seems to be getting worse each day. No fevers, chills, nausea, vomiting, or diarrhea. 05/29/2019 on evaluation today patient appears to be doing much better with regard to her leg which in fact appears to be completely healed. That is on the left. In regard to her right great toe this is showing signs of improvement I am very pleased with how things seem to be progressing after we had removed the blistered skin down to an open wound last week. Overall I feel like there is new tissue growth and I am very excited that this in 1 week is doing so well. Electronic Signature(s) Signed: 05/29/2019 12:15:47 PM By: Worthy Keeler PA-C Entered By: Worthy Keeler on 05/29/2019 12:15:47 -------------------------------------------------------------------------------- Physical Exam Details Patient Name: Date of Service: Tonya Hunter, Tonya Hunter 05/29/2019 11:00 AM Medical Record [id number] Patient Account Number: [id number] Date of Birth/Sex: Treating RN: Aug 30, 1934 (85 y.o. F) Primary Care Provider: [name] Other Clinician: Referring Provider: Treating Provider/Extender:Stone III, [name], [name] Weeks in Treatment: 1 Constitutional Well-nourished and well-hydrated in no acute distress. Respiratory normal breathing without difficulty. Psychiatric this patient is able to make decisions and demonstrates good insight into disease process. Alert and Oriented x 3. pleasant and cooperative. Notes Patient's wound bed currently did not require any sharp debridement in regard to the toe ulcer she is tolerating the dressing changes without complication and overall seems to be doing quite well and very pleased in this regard. Electronic Signature(s) Signed: 05/29/2019 12:16:09 PM By: Worthy Keeler PA-C Entered By: Worthy Keeler on 05/29/2019  12:16:09 -------------------------------------------------------------------------------- Physician Orders Details Patient Name: Date of Service: Tonya Hunter 05/29/2019 11:00 AM Medical Record [id number] Patient Account Number:  [id number] Date of Birth/Sex: Treating RN: May 05, 1935 (85 y.o. 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 Dressing Change Frequency Wound #2 Right,Medial Toe Great Change Dressing every other day. Wound Cleansing Wound #2 Right,Medial Toe Great May shower and wash wound with soap and water. Primary Wound Dressing Wound #2 Right,Medial Toe Great Calcium Alginate with Silver Secondary Dressing Wound #2 Right,Medial Toe Great Foam - donut Kerlix/Rolled Gauze Dry Gauze Electronic Signature(s) Signed: 05/29/2019 5:55:19 PM By: Worthy Keeler PA-C Signed: 05/29/2019 6:34:17 PM By: [name] RN, BSN Entered By: [name] on 05/29/2019 12:13:40 -------------------------------------------------------------------------------- Problem List Details Patient Name: Date of Service: Tonya Hunter. 05/29/2019 11:00 AM Medical Record [id number] Patient Account Number: [id number] Date of  Birth/Sex: Treating RN: 08-27-1934 (85 y.o. F) Primary Care Provider: [name] Other Clinician: Referring Provider: Treating Provider/Extender:Stone III, [name], [name] Weeks in Treatment: 1 Active Problems ICD-10 Evaluated Encounter Code Description Active Date Today Diagnosis I87.2 Venous insufficiency (chronic) (peripheral) 05/22/2019 No Yes L97.822 Non-pressure chronic ulcer of other part of left lower 05/22/2019 No Yes leg with fat layer exposed E11.622 Type 2 diabetes mellitus with other skin ulcer 05/22/2019 No Yes L97.512 Non-pressure chronic ulcer of other part of right foot 05/22/2019 No Yes with fat layer exposed I10 Essential (primary) hypertension 05/22/2019 No Yes I50.42 Chronic combined systolic (congestive) and diastolic 01/20/5882 No Yes (congestive) heart failure I25.10 Atherosclerotic heart disease of native coronary artery 05/22/2019 No Yes without angina pectoris Inactive Problems Resolved Problems Electronic Signature(s) Signed: 05/29/2019 11:41:35 AM By: Worthy Keeler PA-C Entered By: Worthy Keeler on 05/29/2019 11:41:35 -------------------------------------------------------------------------------- Progress Note Details Patient Name: Date of Service: Tonya Hunter. 05/29/2019 11:00 AM Medical Record [id number] Patient Account Number: [id number] Date of Birth/Sex: Treating RN: 1934-10-12 (85 y.o. F) Primary Care Provider: [name] Other Clinician: Referring Provider: Treating Provider/Extender:Stone III, [name], [name] Weeks in Treatment: 1 Subjective Chief Complaint Information obtained from Patient Left LE Ulcers and right 1st toe ulcer History of Present Illness (HPI) 05/22/2019 patient is seen today in regard to her bilateral lower extremities this was a referral from her primary care provider they were concerned about areas of bruising and contusion that happened on a regular basis. With that being said the  patient really has an issue with her right great toe where she has a significant blister that wraps around to the underside of the toe that has me more concerned to be perfectly honest than the legs themselves do they seem to be doing quite well. I explained in regard to the legs she is good to have ongoing and continued issues with blisters based on the fragility of her skin as long as she is on the Plavix and aspirin this is likely to be an ongoing issue. Honestly what she is going need to do is protect that area. In regard to the toe however that started  2 days ago and she tells me it seems to be getting worse each day. No fevers, chills, nausea, vomiting, or diarrhea. 05/29/2019 on evaluation today patient appears to be doing much better with regard to her leg which in fact appears to be completely healed. That is on the left. In regard to her right great toe this is showing signs of improvement I am very pleased with how things seem to be progressing after we had removed the blistered skin down to an open wound last week. Overall I feel like there is new tissue growth and I am very excited that this in 1 week is doing so well. Objective Constitutional Well-nourished and well-hydrated in no acute distress. Vitals Time Taken: 11:50 AM, Height: 66 in, Weight: 164 lbs, BMI: 26.5, Temperature: 97.7 F, Pulse: 66 bpm, Respiratory Rate: 18 breaths/min, Blood Pressure: 129/56 mmHg, Capillary Blood Glucose: 167 mg/dl. General Notes: glucose per pt report Respiratory normal breathing without difficulty. Psychiatric this patient is able to make decisions and demonstrates good insight into disease process. Alert and Oriented x 3. pleasant and cooperative. General Notes: Patient's wound bed currently did not require any sharp debridement in regard to the toe ulcer she is tolerating the dressing changes without complication and overall seems to be doing quite well and very pleased in this  regard. Integumentary (Hair, Skin) Wound #1 status is Healed - Epithelialized. Original cause of wound was Blister. The wound is located on the [facility] Lower Leg. The wound measures 0cm length x 0cm width x 0cm depth; 0cm^2 area and 0cm^3 volume. There is Fat Layer (Subcutaneous Tissue) Exposed exposed. There is no undermining noted. There is a small amount of serosanguineous drainage noted. The wound margin is flat and intact. There is large (67-100%) pink granulation within the wound bed. There is no necrotic tissue within the wound bed. Wound #2 status is Open. Original cause of wound was Blister. The wound is located on the [employer]. The wound measures 1.9cm length x 1.4cm width x 0.1cm depth; 2.089cm^2 area and 0.209cm^3 volume. There is Fat Layer (Subcutaneous Tissue) Exposed exposed. There is no tunneling noted, however, there is undermining starting at 12:00 and ending at 6:00 with a maximum distance of 0.3cm. There is a medium amount of serosanguineous drainage noted. The wound margin is flat and intact. There is large (67-100%) pink granulation within the wound bed. There is a small (1-33%) amount of necrotic tissue within the wound bed including Adherent Slough. Assessment Active Problems ICD-10 Venous insufficiency (chronic) (peripheral) Non-pressure chronic ulcer of other part of left lower leg with fat layer exposed Type 2 diabetes mellitus with other skin ulcer Non-pressure chronic ulcer of other part of right foot with fat layer exposed Essential (primary) hypertension Chronic combined systolic (congestive) and diastolic (congestive) heart failure Atherosclerotic heart disease of native coronary artery without angina pectoris Plan Follow-up Appointments: Return Appointment in 1 week. Dressing Change Frequency: Wound #2 Right,Medial Toe Great: Change Dressing every other day. Wound Cleansing: Wound #2 Right,Medial Toe Great: May shower and  wash wound with soap and water. Primary Wound Dressing: Wound #2 Right,Medial Toe Great: Calcium Alginate with Silver Secondary Dressing: Wound #2 Right,Medial Toe Great: Foam - donut Kerlix/Rolled Gauze Dry Gauze 1. My suggestion at this point is can be that we continue with the silver alginate dressing currently I think that is doing a good job I think is probably still the best thing to stick with. 2. I would recommend as well that we also  continue with using foam over the toe in order to help with padding and protecting the region. 3. With regard to showering I think she could wash the area but she is somewhat scared to do therefore I would recommend that she just cover the area with a bag in order to prevent her toe from getting wet and then just change her dressings and clean her foot as she needs to when it is due. We will see patient back for reevaluation in 1 week here in the clinic. If anything worsens or changes patient will contact our office for additional recommendations. Electronic Signature(s) Signed: 05/29/2019 12:16:46 PM By: Worthy Keeler PA-C Entered By: Worthy Keeler on 05/29/2019 12:16:46 -------------------------------------------------------------------------------- SuperBill Details Patient Name: Date of Service: Tonya Hunter 05/29/2019 Medical Record [id number] Patient Account Number: [id number] Date of Birth/Sex: Treating RN: 03-25-1935 (84 y.o. Tonya Hunter Primary Care Provider: [name] Other Clinician: Referring Provider: Treating Provider/Extender:Stone III, [name], [name] Weeks in Treatment: 1 Diagnosis Coding ICD-10 Codes Code Description I87.2 Venous insufficiency (chronic) (peripheral) L97.822 Non-pressure chronic ulcer of other part of left lower leg with fat layer exposed E11.622 Type 2 diabetes mellitus with other skin ulcer L97.512 Non-pressure chronic ulcer of other part of right foot with fat layer  exposed I10 Essential (primary) hypertension I50.42 Chronic combined systolic (congestive) and diastolic (congestive) heart failure I25.10 Atherosclerotic heart disease of native coronary artery without angina pectoris Facility Procedures CPT4 Code: [id number] Description: 99213 - WOUND CARE VISIT-LEV 3 EST PT Modifier: Quantity: 1 Physician Procedures CPT4 Code Description: [id number] 27741 - WC PHYS LEVEL 3 - EST PT ICD-10 Diagnosis Description I87.2 Venous insufficiency (chronic) (peripheral) L97.822 Non-pressure chronic ulcer of other part of left lower l E11.622 Type 2 diabetes mellitus with other  skin ulcer L97.512 Non-pressure chronic ulcer of other part of right foot w Modifier: eg with fat laye ith fat layer ex Quantity: 1 r exposed posed Electronic Signature(s) Signed: 05/29/2019 12:17:00 PM By: Worthy Keeler PA-C Entered By: Worthy Keeler on 05/29/2019 12:17:00

## 2019-05-30 ENCOUNTER — Ambulatory Visit: Payer: Medicare Other | Admitting: Physical Therapy

## 2019-05-30 ENCOUNTER — Encounter: Payer: Self-pay | Admitting: Physical Therapy

## 2019-05-30 DIAGNOSIS — M6281 Muscle weakness (generalized): Secondary | ICD-10-CM

## 2019-05-30 DIAGNOSIS — R2681 Unsteadiness on feet: Secondary | ICD-10-CM | POA: Diagnosis not present

## 2019-05-30 DIAGNOSIS — R262 Difficulty in walking, not elsewhere classified: Secondary | ICD-10-CM | POA: Diagnosis not present

## 2019-05-30 DIAGNOSIS — R2689 Other abnormalities of gait and mobility: Secondary | ICD-10-CM

## 2019-05-30 NOTE — Progress Notes (Signed)
[name], [name] ([id number]) Visit Report for 05/27/2019 SuperBill Details Patient Name: Date of Service: Tonya Hunter, Tonya Hunter 05/27/2019 Medical Record [id number] Patient Account Number: [id number] Date of Birth/Sex: Treating RN: [birth year] (85 y.o. Tonya Hunter Primary Care Provider: [name] Other Clinician: Referring Provider: Treating Provider/Extender:Gaelan Glennon, [name], [name] in Treatment: 0 Diagnosis Coding ICD-10 Codes Code Description I87.2 Venous insufficiency (chronic) (peripheral) L97.822 Non-pressure chronic ulcer of other part of left lower leg with fat layer exposed E11.622 Type 2 diabetes mellitus with other skin ulcer L97.512 Non-pressure chronic ulcer of other part of right foot with fat layer exposed I10 Essential (primary) hypertension I50.42 Chronic combined systolic (congestive) and diastolic (congestive) heart failure I25.10 Atherosclerotic heart disease of native coronary artery without angina pectoris Facility Procedures CPT4 Code Description Modifier Quantity [id number] 99213 - WOUND CARE VISIT-LEV 3 EST PT 1 Electronic Signature(s) Signed: 05/29/2019 6:34:17 PM By: [name] RN, BSN Signed: 05/30/2019 5:49:05 PM By: Linton Ham MD Entered By: [name] on 05/29/2019 18:02:34
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
## 2019-05-30 NOTE — Progress Notes (Signed)
Tonya Hunter, Tonya Hunter ([id number]) Visit Report for 05/29/2019 Arrival Information Details Patient Name: Date of Service: Tonya, Hunter 05/29/2019 11:00 AM Medical Record [id number] Patient Account Number: [id number] Date of Birth/Sex: Treating RN: August 22, 1934 (85 y.o. [name] Primary Care Aysen Shieh: [name] Other Clinician: Referring Luanna Weesner: Treating Porshia Blizzard/Extender:Stone III, [name], [name] Weeks in Treatment: 1 Visit Information History Since Last Visit Added or deleted any medications: No Patient Arrived: [place] Any new allergies or adverse reactions: No Arrival Time: 11:48 Had a fall or experienced change in No Accompanied By: alone activities of daily living that may affect Transfer Assistance: None risk of falls: Patient Identification Verified: Yes Signs or symptoms of abuse/neglect since last No Secondary Verification Process Yes visito Completed: Hospitalized since last visit: No Patient Requires Transmission- No Implantable device outside of the clinic excluding No Based Precautions: cellular tissue based products placed in the center Patient Has Alerts: Yes since last visit: Patient Alerts: Patient on Blood Has Dressing in Place as Prescribed: Yes Thinner Pain Present Now: No L ABI non compressible Electronic Signature(s) Signed: 05/30/2019 6:31:26 PM By: Levan Hurst RN, BSN Entered By: Levan Hurst on 05/29/2019 11:50:26 -------------------------------------------------------------------------------- Clinic Level of Care Assessment Details Patient Name: Date of Service: Tonya, Hunter 05/29/2019 11:00 AM Medical Record [id number] Patient Account Number: [id number] Date of Birth/Sex: Treating RN: 1934-08-28 (85 y.o. [name] Primary Care Ricardo Schubach: [name] Other Clinician: Referring Tryston Gilliam: Treating Jaleena Viviani/Extender:Stone III, [name], [name] Weeks in Treatment: 1 Clinic  Level of Care Assessment Items TOOL 4 Quantity Score []  - Use when only an EandM is performed on FOLLOW-UP visit 0 ASSESSMENTS - Nursing Assessment / Reassessment X - Reassessment of Co-morbidities (includes updates in patient status) 1 10 X - Reassessment of Adherence to Treatment Plan 1 5 ASSESSMENTS - Wound and Skin Assessment / Reassessment []  - Simple Wound Assessment / Reassessment - one wound 0 X - Complex Wound Assessment / Reassessment - multiple wounds 2 5 []  - Dermatologic / Skin Assessment (not related to wound area) 0 ASSESSMENTS - Focused Assessment []  - Circumferential Edema Measurements - multi extremities 0 []  - Nutritional Assessment / Counseling / Intervention 0 X - Lower Extremity Assessment (monofilament, tuning fork, pulses) 1 5 []  - Peripheral Arterial Disease Assessment (using hand held doppler) 0 ASSESSMENTS - Ostomy and/or Continence Assessment and Care []  - Incontinence Assessment and Management 0 []  - Ostomy Care Assessment and Management (repouching, etc.) 0 PROCESS - Coordination of Care X - Simple Patient / Family Education for ongoing care 1 15 []  - Complex (extensive) Patient / Family Education for ongoing care 0 X - Staff obtains Programmer, systems, Records, Test Results / Process Orders 1 10 []  - Staff telephones HHA, Nursing Homes / Clarify orders / etc 0 []  - Routine Transfer to another Facility (non-emergent condition) 0 []  - Routine Hospital Admission (non-emergent condition) 0 []  - New Admissions / Biomedical engineer / Ordering NPWT, Apligraf, etc. 0 []  - Emergency Hospital Admission (emergent condition) 0 X - Simple Discharge Coordination 1 10 []  - Complex (extensive) Discharge Coordination 0 PROCESS - Special Needs []  - Pediatric / Minor Patient Management 0 []  - Isolation Patient Management 0 []  - Hearing / Language / Visual special needs 0 []  - Assessment of Community assistance (transportation, D/C planning, etc.) 0 []  - Additional assistance  / Altered mentation 0 []  - Support Surface(s) Assessment (bed, cushion, seat, etc.) 0 INTERVENTIONS - Wound Cleansing / Measurement []  - Simple Wound Cleansing -  one wound 0 X - Complex Wound Cleansing - multiple wounds 2 5 X - Wound Imaging (photographs - any number of wounds) 1 5 []  - Wound Tracing (instead of photographs) 0 []  - Simple Wound Measurement - one wound 0 X - Complex Wound Measurement - multiple wounds 2 5 INTERVENTIONS - Wound Dressings X - Small Wound Dressing one or multiple wounds 1 10 []  - Medium Wound Dressing one or multiple wounds 0 []  - Large Wound Dressing one or multiple wounds 0 X - Application of Medications - topical 1 5 []  - Application of Medications - injection 0 INTERVENTIONS - Miscellaneous []  - External ear exam 0 []  - Specimen Collection (cultures, biopsies, blood, body fluids, etc.) 0 []  - Specimen(s) / Culture(s) sent or taken to Lab for analysis 0 []  - Patient Transfer (multiple staff / Civil Service fast streamer / Similar devices) 0 []  - Simple Staple / Suture removal (25 or less) 0 []  - Complex Staple / Suture removal (26 or more) 0 []  - Hypo / Hyperglycemic Management (close monitor of Blood Glucose) 0 []  - Ankle / Brachial Index (ABI) - do not check if billed separately 0 X - Vital Signs 1 5 Has the patient been seen at the hospital within the last three years: Yes Total Score: 110 Level Of Care: New/Established - Level 3 Electronic Signature(s) Signed: 05/29/2019 6:34:17 PM By: [name] RN, BSN Entered By: [name] on 05/29/2019 12:14:42 -------------------------------------------------------------------------------- Lower Extremity Assessment Details Patient Name: Date of Service: Tonya Hunter. 05/29/2019 11:00 AM Medical Record [id number] Patient Account Number: [id number] Date of Birth/Sex: Treating RN: 10-17-1934 (85 y.o. [name] Primary Care Macaila Tahir: [name] Other Clinician: Referring Mckenze Slone: Treating  Sharee Sturdy/Extender:Stone III, [name], [name] Weeks in Treatment: 1 Edema Assessment Assessed: [Left: No] [Right: No] Edema: [Left: Yes] [Right: Yes] Calf Left: Right: Point of Measurement: 31 cm From Medial Instep 25.4 cm 24 cm Ankle Left: Right: Point of Measurement: 9 cm From Medial Instep 35 cm 35 cm Vascular Assessment Pulses: Dorsalis Pedis Palpable: [Left:Yes] [Right:Yes] Electronic Signature(s) Signed: 05/30/2019 6:31:26 PM By: Levan Hurst RN, BSN Entered By: Levan Hurst on 05/29/2019 12:00:25 -------------------------------------------------------------------------------- [place] Details Patient Name: Date of Service: Tonya Hunter. 05/29/2019 11:00 AM Medical Record [id number] Patient Account Number: [id number] Date of Birth/Sex: Treating RN: 11-Jul-1934 (85 y.o. [name] Primary Care Posey Jasmin: [name] Other Clinician: Referring Verlyn Lambert: Treating Tija Biss/Extender:Stone III, [name], [name] Weeks in Treatment: 1 Active Inactive Abuse / Safety / Falls / Self Care Management Nursing Diagnoses: Potential for falls Goals: Patient/caregiver will verbalize/demonstrate measures taken to prevent injury and/or falls Date Initiated: 05/22/2019 Target Resolution Date: 06/19/2019 Goal Status: Active Interventions: Assess fall risk on admission and as needed Assess impairment of mobility on admission and as needed per policy Assess personal safety and home safety (as indicated) on admission and as needed Notes: Nutrition Nursing Diagnoses: Impaired glucose control: actual or potential Goals: Patient/caregiver will maintain therapeutic glucose control Date Initiated: 05/22/2019 Target Resolution Date: 06/19/2019 Goal Status: Active Interventions: Assess HgA1c results as ordered upon admission and as needed Assess patient nutrition upon admission and as needed per policy Provide education on elevated blood  sugars and impact on wound healing Treatment Activities: Patient referred to Primary Care Physician for further nutritional evaluation : 05/22/2019 Notes: Wound/Skin Impairment Nursing Diagnoses: Impaired tissue integrity Knowledge deficit related to ulceration/compromised skin integrity Goals: Patient/caregiver will verbalize understanding of skin care regimen Date Initiated: 05/22/2019 Target Resolution Date:  06/19/2019 Goal Status: Active Ulcer/skin breakdown will have a volume reduction of 30% by week 4 Date Initiated: 05/22/2019 Target Resolution Date: 06/19/2019 Goal Status: Active Interventions: Assess patient/caregiver ability to obtain necessary supplies Assess patient/caregiver ability to perform ulcer/skin care regimen upon admission and as needed Assess ulceration(s) every visit Provide education on ulcer and skin care Treatment Activities: Skin care regimen initiated : 05/22/2019 Topical wound management initiated : 05/22/2019 Notes: Electronic Signature(s) Signed: 05/29/2019 6:34:17 PM By: [name] RN, BSN Entered By: [name] on 05/29/2019 12:09:32 -------------------------------------------------------------------------------- Pain Assessment Details Patient Name: Date of Service: Tonya Hunter 05/29/2019 11:00 AM Medical Record [id number] Patient Account Number: [id number] Date of Birth/Sex: Treating RN: 02-Apr-1935 (85 y.o. [name] Primary Care Brianny Soulliere: [name] Other Clinician: Referring Addis Tuohy: Treating Tabari Volkert/Extender:Stone III, [name], [name] Weeks in Treatment: 1 Active Problems Location of Pain Severity and Description of Pain Patient Has Paino No Site Locations Pain Management and Medication Current Pain Management: Electronic Signature(s) Signed: 05/30/2019 6:31:26 PM By: Levan Hurst RN, BSN Entered By: Levan Hurst on 05/29/2019  11:50:38 -------------------------------------------------------------------------------- Patient/Caregiver Education Details Patient Name: Date of Service: Everton, Avaiah S. 1/13/2021andnbsp11:00 AM Medical Record Patient Account Number: [id number] Number: [id number] Number: Treating RN: [name] 20-Sep-1934 (84 y.o. Other Clinician: Date of Birth/Gender: F) Treating Worthy Keeler Primary Care Physician: [name] Physician/Extender: Referring Physician: Denyse Dago in Treatment: 1 Education Assessment Education Provided To: Patient Education Topics Provided Elevated Blood Sugar/ Impact on Healing: Methods: Explain/Verbal Responses: Reinforcements needed, State content correctly Wound/Skin Impairment: Methods: Explain/Verbal Responses: Reinforcements needed, State content correctly Electronic Signature(s) Signed: 05/29/2019 6:34:17 PM By: [name] RN, BSN Entered By: [name] on 05/29/2019 12:09:53 -------------------------------------------------------------------------------- Wound Assessment Details Patient Name: Date of Service: Tonya Hunter 05/29/2019 11:00 AM Medical Record [id number] Patient Account Number: [id number] Date of Birth/Sex: Treating RN: 11/05/1934 (85 y.o. [name] Primary Care Finlee Concepcion: [name] Other Clinician: Referring Hercules Hasler: Treating Shatiqua Heroux/Extender:Stone III, [name], [name] Weeks in Treatment: 1 Wound Status Wound Number: 1 Primary Venous Leg Ulcer Etiology: Wound Location: Left Lower Leg - Anterior, Distal Wound Healed - Epithelialized Wounding Event: Blister Status: Date Acquired: 02/14/2019 Comorbid Cataracts, Anemia, Angina, Congestive Heart Weeks Of Treatment: 1 History: Failure, Coronary Artery Disease, Clustered Wound: No Hypertension, Myocardial Infarction, Type II Diabetes, Osteoarthritis, Neuropathy Photos Wound  Measurements Length: (cm) 0 % Reduct Width: (cm) 0 % Reduct Depth: (cm) 0 Epitheli Area: (cm) 0 Undermi Volume: (cm) 0 Wound Description Classification: Full Thickness Without Exposed Support Foul Od Structures Slough/ Wound Flat and Intact Margin: Exudate Small Amount: Exudate Serosanguineous Type: Exudate red, brown Color: Wound Bed Granulation Amount: Large (67-100%) Granulation Quality: Pink Fascia Necrotic Amount: None Present (0%) Fat Lay Tendon Muscle Joint E Bone Ex or After Cleansing: No Fibrino No Exposed Structure Exposed: No er (Subcutaneous Tissue) Exposed: Yes Exposed: No Exposed: No xposed: No posed: No ion in Area: 100% ion in Volume: 100% alization: Large (67-100%) ning: No Electronic Signature(s) Signed: 05/30/2019 3:33:56 PM By: Mikeal Hawthorne EMT/HBOT Signed: 05/30/2019 6:38:38 PM By: [name] RN, BSN Previous Signature: 05/29/2019 6:34:17 PM Version By: [name] RN, BSN Entered By: Mikeal Hawthorne on 05/30/2019 11:19:56 -------------------------------------------------------------------------------- Wound Assessment Details Patient Name: Date of Service: Tonya Hunter. 05/29/2019 11:00 AM Medical Record [id number] Patient Account Number: [id number] Date of Birth/Sex: Treating RN: 1934/08/06 (85 y.o. [name] Primary Care Ezzie Senat: [name] Other Clinician: Referring Cydnie Deason: Treating Francisca Harbuck/Extender:Stone III, [name],  [name] Weeks in Treatment: 1 Wound Status Wound Number: 2 Primary Diabetic Wound/Ulcer of the Lower Extremity Etiology: Wound Location: Right Toe Great - Medial Wound Open Wounding Event: Blister Status: Date Acquired: 05/22/2019 Comorbid Cataracts, Anemia, Angina, Congestive Heart Weeks Of Treatment: 1 History: Failure, Coronary Artery Disease, Clustered Wound: No Hypertension, Myocardial Infarction, Type II Diabetes, Osteoarthritis, Neuropathy Photos Wound  Measurements Length: (cm) 1.9 Width: (cm) 1.4 Depth: (cm) 0.1 Area: (cm) 2.089 Volume: (cm) 0.209 % Reduction in Area: 71.1% % Reduction in Volume: 71.1% Epithelialization: Small (1-33%) Tunneling: No Undermining: Yes Starting Position (o'clock): 12 Ending Position (o'clock): 6 Maximum Distance: (cm) 0.3 Wound Description Classification: Grade 2 Foul Odor Wound Margin: Flat and Intact Slough/Fib Exudate Amount: Medium Exudate Type: Serosanguineous Exudate Color: red, brown Wound Bed Granulation Amount: Large (67-100%) Granulation Quality: Pink Fascia Expo Necrotic Amount: Small (1-33%) Fat Layer ( Necrotic Quality: Adherent Slough Tendon Expo Muscle Expo Joint Expos Bone Expose After Cleansing: No rino No Exposed Structure sed: No Subcutaneous Tissue) Exposed: Yes sed: No sed: No ed: No d: No Electronic Signature(s) Signed: 05/30/2019 3:33:56 PM By: Mikeal Hawthorne EMT/HBOT Signed: 05/30/2019 6:31:26 PM By: Levan Hurst RN, BSN Entered By: Mikeal Hawthorne on 05/30/2019 11:20:26 -------------------------------------------------------------------------------- Vitals Details Patient Name: Date of Service: Tonya Hunter. 05/29/2019 11:00 AM Medical Record [id number] Patient Account Number: [id number] Date of Birth/Sex: Treating RN: May 29, 1934 (85 y.o. [name] Primary Care Navon Kotowski: [name] Other Clinician: Referring Sanai Frick: Treating Wilbon Obenchain/Extender:Stone III, [name], [name] Weeks in Treatment: 1 Vital Signs Time Taken: 11:50 Temperature (F): 97.7 Height (in): 66 Pulse (bpm): 66 Weight (lbs): 164 Respiratory Rate (breaths/min): 18 Body Mass Index (BMI): 26.5 Blood Pressure (mmHg): 129/56 Capillary Blood Glucose (mg/dl): 167 Reference Range: 80 - 120 mg / dl Notes glucose per pt report Electronic Signature(s) Signed: 05/30/2019 6:31:26 PM By: Levan Hurst RN, BSN Entered By: Levan Hurst on 05/29/2019  11:53:16

## 2019-06-04 ENCOUNTER — Encounter: Payer: Self-pay | Admitting: Physical Therapy

## 2019-06-04 ENCOUNTER — Ambulatory Visit: Payer: Medicare Other | Admitting: Physical Therapy

## 2019-06-04 ENCOUNTER — Other Ambulatory Visit: Payer: Self-pay

## 2019-06-04 DIAGNOSIS — R262 Difficulty in walking, not elsewhere classified: Secondary | ICD-10-CM

## 2019-06-04 DIAGNOSIS — M6281 Muscle weakness (generalized): Secondary | ICD-10-CM

## 2019-06-04 DIAGNOSIS — R2689 Other abnormalities of gait and mobility: Secondary | ICD-10-CM | POA: Diagnosis not present

## 2019-06-04 DIAGNOSIS — R2681 Unsteadiness on feet: Secondary | ICD-10-CM | POA: Diagnosis not present
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
## 2019-06-05 ENCOUNTER — Ambulatory Visit
Admission: RE | Admit: 2019-06-05 | Discharge: 2019-06-05 | Disposition: A | Discharge status: Home / Self Care | Payer: Medicare Other | Source: Ambulatory Visit | Attending: Gastroenterology | Admitting: Gastroenterology

## 2019-06-05 ENCOUNTER — Encounter (HOSPITAL_BASED_OUTPATIENT_CLINIC_OR_DEPARTMENT_OTHER): Payer: Medicare Other | Admitting: Physician Assistant

## 2019-06-05 ENCOUNTER — Ambulatory Visit (HOSPITAL_BASED_OUTPATIENT_CLINIC_OR_DEPARTMENT_OTHER): Payer: Medicare Other | Admitting: Physician Assistant

## 2019-06-05 DIAGNOSIS — E11622 Type 2 diabetes mellitus with other skin ulcer: Secondary | ICD-10-CM | POA: Diagnosis not present

## 2019-06-05 DIAGNOSIS — K7469 Other cirrhosis of liver: Secondary | ICD-10-CM

## 2019-06-05 DIAGNOSIS — L97512 Non-pressure chronic ulcer of other part of right foot with fat layer exposed: Secondary | ICD-10-CM | POA: Diagnosis not present

## 2019-06-05 DIAGNOSIS — S81811A Laceration without foreign body, right lower leg, initial encounter: Secondary | ICD-10-CM | POA: Diagnosis not present

## 2019-06-05 DIAGNOSIS — L97822 Non-pressure chronic ulcer of other part of left lower leg with fat layer exposed: Secondary | ICD-10-CM | POA: Diagnosis not present

## 2019-06-05 DIAGNOSIS — E11621 Type 2 diabetes mellitus with foot ulcer: Secondary | ICD-10-CM | POA: Diagnosis not present

## 2019-06-05 DIAGNOSIS — I110 Hypertensive heart disease with heart failure: Secondary | ICD-10-CM | POA: Diagnosis not present

## 2019-06-05 DIAGNOSIS — S81801A Unspecified open wound, right lower leg, initial encounter: Secondary | ICD-10-CM | POA: Diagnosis not present

## 2019-06-05 DIAGNOSIS — I5042 Chronic combined systolic (congestive) and diastolic (congestive) heart failure: Secondary | ICD-10-CM | POA: Diagnosis not present

## 2019-06-05 NOTE — Progress Notes (Addendum)
Tonya Hunter, Tonya Hunter ([id number]) Visit Report for 06/05/2019 Chief Complaint Document Details Patient Name: Date of Service: Tonya Hunter, Tonya Hunter 06/05/2019 2:30 PM Medical Record [id number] Patient Account Number: [id number] Date of Birth/Sex: Treating RN: 03-10-1935 (85 y.o. F) Primary Care Provider: [name] Other Clinician: Referring Provider: Treating Provider/Extender:Stone III, [name], [name] Weeks in Treatment: 2 Information Obtained from: Patient Chief Complaint Right LE skin tear and right 1st toe ulcer Electronic Signature(s) Signed: 06/05/2019 4:13:22 PM By: Worthy Keeler PA-C Previous Signature: 06/05/2019 3:16:41 PM Version By: Worthy Keeler PA-C Entered By: Worthy Keeler on 06/05/2019 16:13:21 -------------------------------------------------------------------------------- Debridement Details Patient Name: Date of Service: Tonya Hunter 06/05/2019 2:30 PM Medical Record [id number] Patient Account Number: [id number] Date of Birth/Sex: Treating RN: 1935-05-11 (84 y.o. [name] Primary Care Provider: [name] Other Clinician: Referring Provider: Treating Provider/Extender:Stone III, [name], [name] Weeks in Treatment: 2 Debridement Performed for Wound #2 Right,Medial Toe Great Assessment: Performed By: Physician Worthy Keeler, PA Debridement Type: Debridement Severity of Tissue Pre Fat layer exposed Debridement: Level of Consciousness (Pre- Awake and Alert procedure): Pre-procedure Verification/Time Out Taken: Yes - 16:00 Start Time: 16:00 Pain Control: Lidocaine 4% Topical Solution Total Area Debrided (L x W): 3.5 (cm) x 2 (cm) = 7 (cm) Tissue and other material Viable, Non-Viable, Callus, Slough, Subcutaneous, Skin: Epidermis, Slough debrided: Level: Skin/Subcutaneous Tissue Debridement Description: Excisional Instrument: Curette Bleeding: Minimum Hemostasis Achieved: Pressure End Time:  16:07 Procedural Pain: 4 Post Procedural Pain: 3 Response to Treatment: Procedure was tolerated well Level of Consciousness Awake and Alert (Post-procedure): Post Debridement Measurements of Total Wound Length: (cm) 1.2 Width: (cm) 1.2 Depth: (cm) 0.1 Volume: (cm) 0.113 Character of Wound/Ulcer Post Improved Debridement: Severity of Tissue Post Debridement: Fat layer exposed Post Procedure Diagnosis Same as Pre-procedure Electronic Signature(s) Signed: 06/05/2019 6:03:18 PM By: [name] RN, BSN Signed: 06/05/2019 7:03:16 PM By: Worthy Keeler PA-C Entered By: [name] on 06/05/2019 16:08:47 -------------------------------------------------------------------------------- HPI Details Patient Name: Date of Service: Tonya Hunter 06/05/2019 2:30 PM Medical Record [id number] Patient Account Number: [id number] Date of Birth/Sex: Treating RN: [birth year] (85 y.o. F) Primary Care Provider: [name] Other Clinician: Referring Provider: Treating Provider/Extender:Stone III, [name], [name] Weeks in Treatment: 2 History of Present Illness HPI Description: 05/22/2019 patient is seen today in regard to her bilateral lower extremities this was a referral from her primary care provider they were concerned about areas of bruising and contusion that happened on a regular basis. With that being said the patient really has an issue with her right great toe where she has a significant blister that wraps around to the underside of the toe that has me more concerned to be perfectly honest than the legs themselves do they seem to be doing quite well. I explained in regard to the legs she is good to have ongoing and continued issues with blisters based on the fragility of her skin as long as she is on the Plavix and aspirin this is likely to be an ongoing issue. Honestly what she is going need to do is protect that area. In regard to the toe however that started  2 days ago and she tells me it seems to be getting worse each day. No fevers, chills, nausea, vomiting, or diarrhea. 05/29/2019 on evaluation today patient appears to be doing much better with regard to her leg which in fact appears to be completely healed. That is on the left. In regard  to her right great toe this is showing signs of improvement I am very pleased with how things seem to be progressing after we had removed the blistered skin down to an open wound last week. Overall I feel like there is new tissue growth and I am very excited that this in 1 week is doing so well. 06/05/2019 on evaluation today patient appears to be doing actually fairly well in regard to her toe ulcer. Unfortunately she does have a skin tear on the anterior surface of her right lower extremity which unfortunately is a little bit rolled under on the edge of the skin tear. Nonetheless I think we may be able to straighten this out and if so that would go a long way to help in this area to heal more effectively and quickly. Fortunately there is no signs of active infection at this time. She does have some swelling but again I think this is secondary to the skin tear as opposed to anything else. Electronic Signature(s) Signed: 06/05/2019 4:13:27 PM By: Worthy Keeler PA-C Entered By: Worthy Keeler on 06/05/2019 16:13:27 -------------------------------------------------------------------------------- Physical Exam Details Patient Name: Date of Service: Tonya Hunter, Tonya Hunter 06/05/2019 2:30 PM Medical Record [id number] Patient Account Number: [id number] Date of Birth/Sex: Treating RN: [birth year] (85 y.o. F) Primary Care Provider: [name] Other Clinician: Referring Provider: Treating Provider/Extender:Stone III, [name], [name] Weeks in Treatment: 2 Constitutional Well-nourished and well-hydrated in no acute distress. Respiratory normal breathing without difficulty. Psychiatric this patient  is able to make decisions and demonstrates good insight into disease process. Alert and Oriented x 3. pleasant and cooperative. Notes Upon inspection today patient's wound bed actually showed signs of good granulation at this time there was some slough noted that did require sharp debridement that was performed today without complication post debridement wound bed appears to be doing much better which is excellent news. In regard to the skin tear I did actually use a sterile Q-tip along with Steri-Strips to reapproximate the edges of the skin tear and fortunately was able to completely cover the surface of the wound. I am hopeful this will reattach and survive we will see how things appear next week. The patient was very appreciative of this as well. Electronic Signature(s) Signed: 06/05/2019 4:14:03 PM By: Worthy Keeler PA-C Entered By: Worthy Keeler on 06/05/2019 16:14:03 -------------------------------------------------------------------------------- Physician Orders Details Patient Name: Date of Service: Tonya Hunter 06/05/2019 2:30 PM Medical Record [id number] Patient Account Number: [id number] Date of Birth/Sex: Treating RN: Aug 15, 1934 (84 y.o. 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 Dressing Change Frequency Wound #2 Right,Medial Toe Great Change Dressing every other day. Wound #3 Right,Anterior Lower Leg Do not change entire dressing for one week. Wound Cleansing Wound #2 Right,Medial Toe Great May shower and wash wound with soap and water. Wound #3 Right,Anterior Lower Leg May shower with protection. Primary Wound Dressing  Wound #2 Right,Medial Toe Great Hydrofera Blue - ready Wound #3 Right,Anterior Lower Leg Other: - steristrips covered with foam border Secondary Dressing Wound #2 Right,Medial Toe Great Foam - donut Kerlix/Rolled Gauze Dry Gauze Off-Loading Other: - always wear shoes when walking even in the house to protect feet Electronic Signature(s) Signed: 06/05/2019 6:03:18 PM By: [name] RN, BSN Signed: 06/05/2019 7:03:16 PM By: Worthy Keeler PA-C Entered By: [name] on 06/05/2019 16:10:04 -------------------------------------------------------------------------------- Problem List Details Patient Name: Date of Service: Tonya Hunter 06/05/2019 2:30 PM Medical Record [id number] Patient Account Number: [id number] Date of Birth/Sex: Treating RN: 1934/09/23 (85 y.o. F) Primary Care Provider: [name] Other Clinician: Referring Provider: Treating Provider/Extender:Stone III, [name], [name] Weeks in Treatment: 2 Active Problems ICD-10 Evaluated Encounter Code Description Active Date Today Diagnosis I87.2 Venous insufficiency (chronic) (peripheral) 05/22/2019 No Yes L97.822 Non-pressure chronic ulcer of other part of left lower 05/22/2019 No Yes leg with fat layer exposed S81.801A Unspecified open wound, right lower leg, initial 06/05/2019 No Yes encounter E11.622 Type 2 diabetes mellitus with other skin ulcer 05/22/2019 No Yes L97.512 Non-pressure chronic ulcer of other part of right foot 05/22/2019 No Yes with fat layer exposed I10  Essential (primary) hypertension 05/22/2019 No Yes I50.42 Chronic combined systolic (congestive) and diastolic 5/0/5397 No Yes (congestive) heart failure I25.10 Atherosclerotic heart disease of native coronary artery 05/22/2019 No Yes without angina pectoris Inactive Problems Resolved Problems Electronic Signature(s) Signed: 06/05/2019 4:12:55 PM By: Worthy Keeler PA-C Previous Signature: 06/05/2019 3:16:35 PM Version By: Worthy Keeler PA-C Entered By: Worthy Keeler on 06/05/2019 16:12:55 -------------------------------------------------------------------------------- Progress Note Details Patient Name: Date of Service: Tonya Hunter 06/05/2019 2:30 PM Medical Record [id number] Patient Account Number: [id number] Date of Birth/Sex: Treating RN: 10-09-1934 (84 y.o. F) Primary Care Provider: [name] Other Clinician: Referring Provider: Treating Provider/Extender:Stone III, [name], [name] Weeks in Treatment: 2 Subjective Chief Complaint Information obtained from Patient Right LE skin tear and right 1st toe ulcer History of Present Illness (HPI) 05/22/2019 patient is seen today in regard to her bilateral lower extremities this was a referral from her primary care provider they were concerned about areas of bruising and contusion that happened on a regular basis. With that being said the patient really has an issue with her right great toe where she has a significant blister that wraps around to the underside of the toe that has me more concerned to be perfectly honest than the legs themselves do they seem to be doing quite well. I explained in regard to the legs she is good to have ongoing and continued issues with blisters based on the fragility of her skin as long as she is on the Plavix and aspirin this is likely to be an ongoing issue. Honestly what she is going need to do is protect that area. In regard to the toe however that started 2 days ago and she  tells me it seems to be getting worse each day. No fevers, chills, nausea, vomiting, or diarrhea. 05/29/2019 on evaluation today patient appears to be doing much better with regard to her leg which in fact appears to be completely healed. That is on the left. In regard to her right great toe this is showing signs of improvement I am very pleased with how things seem to be progressing after we had removed the blistered skin down to an open wound last week. Overall I feel like there is new tissue growth and I  am very excited that this in 1 week is doing so well. 06/05/2019 on evaluation today patient appears to be doing actually fairly well in regard to her toe ulcer. Unfortunately she does have a skin tear on the anterior surface of her right lower extremity which unfortunately is a little bit rolled under on the edge of the skin tear. Nonetheless I think we may be able to straighten this out and if so that would go a long way to help in this area to heal more effectively and quickly. Fortunately there is no signs of active infection at this time. She does have some swelling but again I think this is secondary to the skin tear as opposed to anything else. Objective Constitutional Well-nourished and well-hydrated in no acute distress. Vitals Time Taken: 3:00 PM, Height: 66 in, Weight: 164 lbs, BMI: 26.5, Temperature: 98.1 F, Pulse: 67 bpm, Respiratory Rate: 20 breaths/min, Blood Pressure: 128/58 mmHg, Capillary Blood Glucose: 153 mg/dl. Respiratory normal breathing without difficulty. Psychiatric this patient is able to make decisions and demonstrates good insight into disease process. Alert and Oriented x 3. pleasant and cooperative. General Notes: Upon inspection today patient's wound bed actually showed signs of good granulation at this time there was some slough noted that did require sharp debridement that was performed today without complication post debridement wound bed appears to be  doing much better which is excellent news. In regard to the skin tear I did actually use a sterile Q-tip along with Steri-Strips to reapproximate the edges of the skin tear and fortunately was able to completely cover the surface of the wound. I am hopeful this will reattach and survive we will see how things appear next week. The patient was very appreciative of this as well. Integumentary (Hair, Skin) Wound #2 status is Open. Original cause of wound was Blister. The wound is located on the [employer]. The wound measures 1.2cm length x 1.2cm width x 0.1cm depth; 1.131cm^2 area and 0.113cm^3 volume. There is Fat Layer (Subcutaneous Tissue) Exposed exposed. There is no tunneling or undermining noted. There is a medium amount of serosanguineous drainage noted. The wound margin is flat and intact. There is large (67-100%) pink granulation within the wound bed. There is a small (1-33%) amount of necrotic tissue within the wound bed including Adherent Slough. Wound #3 status is Open. Original cause of wound was Gradually Appeared. The wound is located on the Right,Anterior Lower Leg. The wound measures 1.3cm length x 1.5cm width x 0.1cm depth; 1.532cm^2 area and 0.153cm^3 volume. There is no tunneling or undermining noted. There is a medium amount of serosanguineous drainage noted. The wound margin is distinct with the outline attached to the wound base. There is large (67-100%) red, pink granulation within the wound bed. Assessment Active Problems ICD-10 Venous insufficiency (chronic) (peripheral) Non-pressure chronic ulcer of other part of left lower leg with fat layer exposed Unspecified open wound, right lower leg, initial encounter Type 2 diabetes mellitus with other skin ulcer Non-pressure chronic ulcer of other part of right foot with fat layer exposed Essential (primary) hypertension Chronic combined systolic (congestive) and diastolic (congestive) heart  failure Atherosclerotic heart disease of native coronary artery without angina pectoris Procedures Wound #2 Pre-procedure diagnosis of Wound #2 is a Diabetic Wound/Ulcer of the Lower Extremity located on the Right,Medial Toe Great .Severity of Tissue Pre Debridement is: Fat layer exposed. There was a Excisional Skin/Subcutaneous Tissue Debridement with a total area of 7 sq cm performed by Worthy Keeler,  PA. With the following instrument(s): Curette to remove Viable and Non-Viable tissue/material. Material removed includes Callus, Subcutaneous Tissue, Slough, and Skin: Epidermis after achieving pain control using Lidocaine 4% Topical Solution. No specimens were taken. A time out was conducted at 16:00, prior to the start of the procedure. A Minimum amount of bleeding was controlled with Pressure. The procedure was tolerated well with a pain level of 4 throughout and a pain level of 3 following the procedure. Post Debridement Measurements: 1.2cm length x 1.2cm width x 0.1cm depth; 0.113cm^3 volume. Character of Wound/Ulcer Post Debridement is improved. Severity of Tissue Post Debridement is: Fat layer exposed. Post procedure Diagnosis Wound #2: Same as Pre-Procedure Plan Follow-up Appointments: Return Appointment in 1 week. Dressing Change Frequency: Wound #2 Right,Medial Toe Great: Change Dressing every other day. Wound #3 Right,Anterior Lower Leg: Do not change entire dressing for one week. Wound Cleansing: Wound #2 Right,Medial Toe Great: May shower and wash wound with soap and water. Wound #3 Right,Anterior Lower Leg: May shower with protection. Primary Wound Dressing: Wound #2 Right,Medial Toe Great: Hydrofera Blue - ready Wound #3 Right,Anterior Lower Leg: Other: - steristrips covered with foam border Secondary Dressing: Wound #2 Right,Medial Toe Great: Foam - donut Kerlix/Rolled Gauze Dry Gauze Off-Loading: Other: - always wear shoes when walking even in the house to  protect feet 1. My suggestion at this time is good to be that we go ahead and continue with the current wound care measures at this point with regard to her toe ulcer we will however switch things up and use a Hydrofera Blue dressing. 2. In regard to the skin tear did apply Steri-Strips we put a bordered foam dressing over top of this and that should stay in place until she comes back. 3. I do recommend that she continue with the protection for the toe as far as always wearing shoes something loose that will not push on the area I recommended she not go barefoot any point in time obviously I think this is a bad idea for any diabetic. We will see patient back for reevaluation in 1 week here in the clinic. If anything worsens or changes patient will contact our office for additional recommendations. Electronic Signature(s) Signed: 06/05/2019 4:14:45 PM By: Worthy Keeler PA-C Entered By: Worthy Keeler on 06/05/2019 16:14:44 -------------------------------------------------------------------------------- SuperBill Details Patient Name: Date of Service: Tonya Hunter, Tonya Hunter 06/05/2019 Medical Record [id number] Patient Account Number: [id number] Date of Birth/Sex: Treating RN: 1935/03/25 (85 y.o. [name] Primary Care Provider: [name] Other Clinician: Referring Provider: Treating Provider/Extender:Stone III, [name], [name] Weeks in Treatment: 2 Diagnosis Coding ICD-10 Codes Code Description I87.2 Venous insufficiency (chronic) (peripheral) L97.822 Non-pressure chronic ulcer of other part of left lower leg with fat layer exposed S81.801A Unspecified open wound, right lower leg, initial encounter E11.622 Type 2 diabetes mellitus with other skin ulcer L97.512 Non-pressure chronic ulcer of other part of right foot with fat layer exposed I10 Essential (primary) hypertension I50.42 Chronic combined systolic (congestive) and diastolic (congestive) heart  failure I25.10 Atherosclerotic heart disease of native coronary artery without angina pectoris Facility Procedures CPT4 Code Description: [id number] 11042 - DEB SUBQ TISSUE 20 SQ CM/< ICD-10 Diagnosis Description L97.512 Non-pressure chronic ulcer of other part of right foot with Modifier: fat layer exp Quantity: 1 osed Physician Procedures CPT4 Code Description: [id number] 99214 - WC PHYS LEVEL 4 - EST PT ICD-10 Diagnosis Description I87.2 Venous insufficiency (chronic) (peripheral) L97.822 Non-pressure chronic ulcer of other part of left lower  le E11.622 Type 2 diabetes mellitus with other  skin ulcer S81.801A Unspecified open wound, right lower leg, initial encounte Modifier: 25 g with fat laye r Quantity: 1 r exposed CPT4 Code Description: [id number] - WC PHYS SUBQ TISS 20 SQ CM ICD-10 Diagnosis Description L97.512 Non-pressure chronic ulcer of other part of right foot with Modifier: fat layer expos Quantity: 1 ed Electronic Signature(s) Signed: 06/05/2019 4:15:13 PM By: Worthy Keeler PA-C Entered By: Worthy Keeler on 06/05/2019 16:15:12

## 2019-06-05 NOTE — Progress Notes (Addendum)
NARISSA, BEAUFORT ([id number]) Visit Report for 06/05/2019 Arrival Information Details Patient Name: Date of Service: DELANO, SCARDINO 06/05/2019 2:30 PM Medical Record [id number] Patient Account Number: [id number] Date of Birth/Sex: Treating RN: 1935/03/28 (85 y.o. [name], Tammi Klippel Primary Care Alisha Bacus: [name] Other Clinician: Referring Hellon Vaccarella: Treating Lucely Leard/Extender:Stone III, [name], [name] Weeks in Treatment: 2 Visit Information History Since Last Visit Added or deleted any medications: No Patient Arrived: Walker Any new allergies or adverse reactions: No Arrival Time: 14:54 Had a fall or experienced change in No Accompanied By: alone activities of daily living that may affect Transfer Assistance: None risk of falls: Patient Identification Verified: Yes Signs or symptoms of abuse/neglect since last No Secondary Verification Process Yes visito Completed: Hospitalized since last visit: No Patient Requires Transmission- No Implantable device outside of the clinic excluding No Based Precautions: cellular tissue based products placed in the center Patient Has Alerts: Yes since last visit: Patient Alerts: Patient on Blood Has Dressing in Place as Prescribed: Yes Thinner Pain Present Now: Yes L ABI non compressible Electronic Signature(s) Signed: 06/05/2019 5:33:22 PM By: Deon Pilling Entered By: Deon Pilling on 06/05/2019 15:04:33 -------------------------------------------------------------------------------- Encounter Discharge Information Details Patient Name: Date of Service: [name] 06/05/2019 2:30 PM Medical Record [id number] Patient Account Number: [id number] Date of Birth/Sex: Treating RN: 03-05-1935 (85 y.o. [name] Primary Care Deshanae Lindo: [name] Other Clinician: Referring Trip Cavanagh: Treating Yecheskel Kurek/Extender:Stone III, [name], [name] Weeks in Treatment: 2 Encounter Discharge  Information Items Post Procedure Vitals Discharge Condition: Stable Temperature (F): 98.1 Ambulatory Status: Walker Pulse (bpm): 67 Discharge Destination: Home Respiratory Rate (breaths/min): 20 Transportation: Private Auto Blood Pressure (mmHg): 128/58 Accompanied By: self Schedule Follow-up Appointment: Yes Clinical Summary of Care: Patient Declined Electronic Signature(s) Signed: 06/05/2019 5:42:11 PM By: [name] RN Entered By: [name] on 06/05/2019 16:26:04 -------------------------------------------------------------------------------- Lower Extremity Assessment Details Patient Name: Date of Service: LILLYEN, SCHOW 06/05/2019 2:30 PM Medical Record [id number] Patient Account Number: [id number] Date of Birth/Sex: Treating RN: 1935-04-04 (85 y.o. [name], Tammi Klippel Primary Care Luisenrique Conran: [name] Other Clinician: Referring Deanta Mincey: Treating Amsi Grimley/Extender:Stone III, [name], [name] Weeks in Treatment: 2 Edema Assessment Assessed: [Left: No] [Right: Yes] Edema: [Left: Yes] [Right: Yes] Calf Left: Right: Point of Measurement: 31 cm From Medial Instep cm 38 cm Ankle Left: Right: Point of Measurement: 9 cm From Medial Instep cm 28 cm Electronic Signature(s) Signed: 06/05/2019 5:33:22 PM By: Deon Pilling Entered By: Deon Pilling on 06/05/2019 15:08:06 -------------------------------------------------------------------------------- Multi-Disciplinary Care Plan Details Patient Name: Date of Service: [name] 06/05/2019 2:30 PM Medical Record [id number] Patient Account Number: [id number] Date of Birth/Sex: Treating RN: 1934-09-21 (85 y.o. [name] Primary Care Hermione Havlicek: [name] Other Clinician: Referring Karlene Southard: Treating Ashkan Chamberland/Extender:Stone III, [name], [name] Weeks in Treatment: 2 Active Inactive Abuse / Safety / Falls / Self Care Management Nursing Diagnoses: Potential for  falls Goals: Patient/caregiver will verbalize/demonstrate measures taken to prevent injury and/or falls Date Initiated: 05/22/2019 Target Resolution Date: 06/19/2019 Goal Status: Active Interventions: Assess fall risk on admission and as needed Assess impairment of mobility on admission and as needed per policy Assess personal safety and home safety (as indicated) on admission and as needed Notes: Nutrition Nursing Diagnoses: Impaired glucose control: actual or potential Goals: Patient/caregiver will maintain therapeutic glucose control Date Initiated: 05/22/2019 Target Resolution Date: 06/19/2019 Goal Status: Active Interventions: Assess HgA1c results as ordered upon admission and as needed Assess patient nutrition upon admission and as  needed per policy Provide education on elevated blood sugars and impact on wound healing Treatment Activities: Patient referred to Primary Care Physician for further nutritional evaluation : 05/22/2019 Notes: Wound/Skin Impairment Nursing Diagnoses: Impaired tissue integrity Knowledge deficit related to ulceration/compromised skin integrity Goals: Patient/caregiver will verbalize understanding of skin care regimen Date Initiated: 05/22/2019 Target Resolution Date: 06/19/2019 Goal Status: Active Ulcer/skin breakdown will have a volume reduction of 30% by week 4 Date Initiated: 05/22/2019 Target Resolution Date: 06/19/2019 Goal Status: Active Interventions: Assess patient/caregiver ability to obtain necessary supplies Assess patient/caregiver ability to perform ulcer/skin care regimen upon admission and as needed Assess ulceration(s) every visit Provide education on ulcer and skin care Treatment Activities: Skin care regimen initiated : 05/22/2019 Topical wound management initiated : 05/22/2019 Notes: Electronic Signature(s) Signed: 06/05/2019 6:03:18 PM By: [name] RN, BSN Entered By: [name] on 06/05/2019  15:54:48 -------------------------------------------------------------------------------- Pain Assessment Details Patient Name: Date of Service: [name] 06/05/2019 2:30 PM Medical Record [id number] Patient Account Number: [id number] Date of Birth/Sex: Treating RN: 1934/06/29 (85 y.o. [name], Tammi Klippel Primary Care Aleeah Greeno: [name] Other Clinician: Referring Diangelo Radel: Treating Brae Schaafsma/Extender:Stone III, [name], [name] Weeks in Treatment: 2 Active Problems Location of Pain Severity and Description of Pain Patient Has Paino Yes Site Locations Pain Location: Pain in Ulcers Rate the pain. Current Pain Level: 5 Worst Pain Level: 10 Least Pain Level: 0 Tolerable Pain Level: 7 Character of Pain Describe the Pain: Aching, Sharp Pain Management and Medication Current Pain Management: Medication: Yes Cold Application: No Rest: Yes Massage: No Activity: No T.E.N.S.: No Heat Application: No Leg drop or elevation: No Is the Current Pain Management Adequate: Adequate How does your wound impact your activities of daily livingo Sleep: No Bathing: No Appetite: No Relationship With Others: No Bladder Continence: No Emotions: No Bowel Continence: No Work: No Toileting: No Drive: No Dressing: No Hobbies: No Electronic Signature(s) Signed: 06/05/2019 5:33:22 PM By: Deon Pilling Entered By: Deon Pilling on 06/05/2019 15:05:06 -------------------------------------------------------------------------------- Patient/Caregiver Education Details Patient Name: Date of Service: [name] 1/20/2021andnbsp2:30 PM Medical Record [id number] Patient Account Number: [id number] Date of Birth/Gender: Treating RN: [birth year] (85 y.o. [name] Primary Care Physician: [name] Other Clinician: Referring Physician: Treating Physician/Extender:Stone III, [name], Richard Janett Billow in Treatment: 2 Education  Assessment Education Provided To: Patient Education Topics Provided Elevated Blood Sugar/ Impact on Healing: Methods: Explain/Verbal Responses: Reinforcements needed, State content correctly Offloading: Methods: Explain/Verbal Responses: Reinforcements needed, State content correctly Wound/Skin Impairment: Methods: Explain/Verbal Responses: Reinforcements needed, State content correctly Electronic Signature(s) Signed: 06/05/2019 6:03:18 PM By: [name] RN, BSN Entered By: [name] on 06/05/2019 15:55:19 -------------------------------------------------------------------------------- Wound Assessment Details Patient Name: Date of Service: [name] 06/05/2019 2:30 PM Medical Record [id number] Patient Account Number: [id number] Date of Birth/Sex: Treating RN: 03-12-1935 (84 y.o. [name], Tammi Klippel Primary Care Jacai Kipp: [name] Other Clinician: Referring Josede Cicero: Treating Terrie Grajales/Extender:Stone III, [name], [name] Weeks in Treatment: 2 Wound Status Wound Number: 2 Primary Diabetic Wound/Ulcer of the Lower Extremity Etiology: Wound Location: Right Toe Great - Medial Wound Open Wounding Event: Blister Status: Date Acquired: 05/22/2019 Comorbid Cataracts, Anemia, Angina, Congestive Heart Weeks Of Treatment: 2 History: Failure, Coronary Artery Disease, Clustered Wound: No Hypertension, Myocardial Infarction, Type II Diabetes, Osteoarthritis, Neuropathy Photos Wound Measurements Length: (cm) 1.2 % Reduction in Are Width: (cm) 1.2 % Reduction in Vol Depth: (cm) 0.1 Epithelialization: Area: (cm) 1.131 Tunneling: Volume: (cm) 0.113 Undermining: Wound Description Classification: Grade 2 Foul Odor  After C Wound Margin: Flat and Intact Slough/Fibrino Exudate Amount: Medium Exudate Type: Serosanguineous Exudate Color: red, brown Wound Bed Granulation Amount: Large (67-100%) Granulation Quality: Pink Fascia Exposed: Necrotic  Amount: Small (1-33%) Fat Layer [place] Flakes Necrotic Quality: Adherent Slough Tendon Exposed: Muscle Exposed: Joint Exposed: Bone Exposed: leansing: No No Exposed Structure No neous Tissue) Exposed: Yes No No No No a: 84.3% ume: 84.4% Medium (34-66%) No No Treatment Notes Wound #2 (Right, Medial Toe Great) 1. Cleanse With Wound Cleanser 3. Primary Dressing Applied Hydrofera Blue 4. Secondary Dressing Dry Gauze 5. Secured With Tape Notes Horticulturist, commercial) Signed: 06/06/2019 4:35:12 PM By: Mikeal Hawthorne EMT/HBOT Signed: 06/06/2019 6:20:01 PM By: Deon Pilling Previous Signature: 06/05/2019 5:33:22 PM Version By: Deon Pilling Entered By: Mikeal Hawthorne on 06/06/2019 15:23:14 -------------------------------------------------------------------------------- Wound Assessment Details Patient Name: Date of Service: [name] 06/05/2019 2:30 PM Medical Record [id number] Patient Account Number: [id number] Date of Birth/Sex: Treating RN: [birth year] (85 y.o. [name], Tammi Klippel Primary Care Ashantae Pangallo: [name] Other Clinician: Referring Paije Goodhart: Treating Deondria Puryear/Extender:Stone III, [name], [name] Weeks in Treatment: 2 Wound Status Wound Number: 3 Primary Skin Tear Etiology: Wound Location: Right Lower Leg - Anterior Wound Open Wounding Event: Gradually Appeared Status: Date Acquired: 05/29/2019 Comorbid Cataracts, Anemia, Angina, Congestive Heart Weeks Of Treatment: 0 History: Failure, Coronary Artery Disease, Clustered Wound: No Hypertension, Myocardial Infarction, Type II Diabetes, Osteoarthritis, Neuropathy Photos Wound Measurements Length: (cm) 1.3 % Reduct Width: (cm) 1.5 % Reduct Depth: (cm) 0.1 Epitheli Area: (cm) 1.532 Tunneli Volume: (cm) 0.153 Undermi Wound Description Classification: Full Thickness Without Exposed Support Foul Od Structures Slough/ Wound Distinct, outline attached Margin: Exudate  Medium Amount: Exudate Serosanguineous Type: Exudate red, brown Color: Wound Bed Granulation Amount: Large (67-100%) Granulation Quality: Red, Pink Fascia Exp Fat Layer Tendon Exp Muscle Exp Joint Expo Bone Expos or After Cleansing: No Fibrino No Exposed Structure osed: No (Subcutaneous Tissue) Exposed: No osed: No osed: No sed: No ed: No ion in Area: 0% ion in Volume: 0% alization: Small (1-33%) ng: No ning: No Treatment Notes Wound #3 (Right, Anterior Lower Leg) 1. Cleanse With Wound Cleanser 3. Primary Dressing Applied Other primary dressing (specifiy in notes) 4. Secondary Dressing Foam Border Dressing Notes steri strips Electronic Signature(s) Signed: 06/06/2019 4:35:12 PM By: Mikeal Hawthorne EMT/HBOT Signed: 06/06/2019 6:20:01 PM By: Deon Pilling Previous Signature: 06/05/2019 5:33:22 PM Version By: Deon Pilling Entered By: Mikeal Hawthorne on 06/06/2019 15:22:55 -------------------------------------------------------------------------------- Vitals Details Patient Name: Date of Service: [name] 06/05/2019 2:30 PM Medical Record [id number] Patient Account Number: [id number] Date of Birth/Sex: Treating RN: 16-Jan-1935 (85 y.o. [name], Tammi Klippel Primary Care Tammra Pressman: [name] Other Clinician: Referring Emeri Estill: Treating Jere Vanburen/Extender:Stone III, [name], [name] Weeks in Treatment: 2 Vital Signs Time Taken: 15:00 Temperature (F): 98.1 Height (in): 66 Pulse (bpm): 67 Weight (lbs): 164 Respiratory Rate (breaths/min): 20 Body Mass Index (BMI): 26.5 Blood Pressure (mmHg): 128/58 Capillary Blood Glucose (mg/dl): 153 Reference Range: 80 - 120 mg / dl Electronic Signature(s) Signed: 06/05/2019 5:33:22 PM By: Deon Pilling Entered By: Deon Pilling on 06/05/2019 15:06:32

## 2019-06-06 ENCOUNTER — Encounter: Payer: Self-pay | Admitting: Physical Therapy

## 2019-06-06 ENCOUNTER — Other Ambulatory Visit: Payer: Self-pay

## 2019-06-06 ENCOUNTER — Ambulatory Visit: Payer: Medicare Other | Admitting: Physical Therapy

## 2019-06-06 DIAGNOSIS — M6281 Muscle weakness (generalized): Secondary | ICD-10-CM | POA: Diagnosis not present

## 2019-06-06 DIAGNOSIS — R262 Difficulty in walking, not elsewhere classified: Secondary | ICD-10-CM | POA: Diagnosis not present

## 2019-06-06 DIAGNOSIS — R2681 Unsteadiness on feet: Secondary | ICD-10-CM

## 2019-06-06 DIAGNOSIS — R2689 Other abnormalities of gait and mobility: Secondary | ICD-10-CM
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
## 2019-06-11 DIAGNOSIS — K7469 Other cirrhosis of liver: Secondary | ICD-10-CM | POA: Diagnosis not present

## 2019-06-12 ENCOUNTER — Encounter (HOSPITAL_BASED_OUTPATIENT_CLINIC_OR_DEPARTMENT_OTHER): Payer: Medicare Other | Admitting: Physician Assistant

## 2019-06-12 ENCOUNTER — Other Ambulatory Visit: Payer: Self-pay

## 2019-06-12 DIAGNOSIS — S81801A Unspecified open wound, right lower leg, initial encounter: Secondary | ICD-10-CM | POA: Diagnosis not present

## 2019-06-12 DIAGNOSIS — L97512 Non-pressure chronic ulcer of other part of right foot with fat layer exposed: Secondary | ICD-10-CM | POA: Diagnosis not present

## 2019-06-12 DIAGNOSIS — L97822 Non-pressure chronic ulcer of other part of left lower leg with fat layer exposed: Secondary | ICD-10-CM | POA: Diagnosis not present

## 2019-06-12 DIAGNOSIS — E11621 Type 2 diabetes mellitus with foot ulcer: Secondary | ICD-10-CM | POA: Diagnosis not present

## 2019-06-12 DIAGNOSIS — I110 Hypertensive heart disease with heart failure: Secondary | ICD-10-CM | POA: Diagnosis not present

## 2019-06-12 DIAGNOSIS — I5042 Chronic combined systolic (congestive) and diastolic (congestive) heart failure: Secondary | ICD-10-CM | POA: Diagnosis not present

## 2019-06-12 DIAGNOSIS — E11622 Type 2 diabetes mellitus with other skin ulcer: Secondary | ICD-10-CM | POA: Diagnosis not present

## 2019-06-12 NOTE — Progress Notes (Addendum)
Tonya Hunter, Tonya Hunter ([id number]) Visit Report for 06/12/2019 Chief Complaint Document Details Patient Name: Date of Service: Tonya Hunter, Tonya Hunter 06/12/2019 3:00 PM Medical Record [id number] Patient Account Number: [id number] Date of Birth/Sex: Treating RN: June 08, 1934 (85 y.o. F) Primary Care Provider: [name] Other Clinician: Referring Provider: Treating Provider/Extender:Stone III, [name], [name] Weeks in Treatment: 3 Information Obtained from: Patient Chief Complaint Right LE skin tear and right 1st toe ulcer Electronic Signature(s) Signed: 06/12/2019 3:29:31 PM By: Worthy Keeler PA-C Entered By: Worthy Keeler on 06/12/2019 15:29:29 -------------------------------------------------------------------------------- Debridement Details Patient Name: Date of Service: Tonya Hunter. 06/12/2019 3:00 PM Medical Record [id number] Patient Account Number: [id number] Date of Birth/Sex: Treating RN: [birth year] (85 y.o. [name] Primary Care Provider: [name] Other Clinician: Referring Provider: Treating Provider/Extender:Stone III, [name], [name] Weeks in Treatment: 3 Debridement Performed for Wound #2 Right,Medial Toe Great Assessment: Performed By: Physician Worthy Keeler, PA Debridement Type: Debridement Severity of Tissue Pre Fat layer exposed Debridement: Level of Consciousness (Pre- Awake and Alert procedure): Pre-procedure Verification/Time Out Taken: Yes - 16:15 Start Time: 16:18 Pain Control: Lidocaine 5% topical ointment Total Area Debrided (L x W): 1.5 (cm) x 1.5 (cm) = 2.25 (cm) Tissue and other material Viable, Non-Viable, Callus, Slough, Subcutaneous, Skin: Epidermis, Slough debrided: Level: Skin/Subcutaneous Tissue Debridement Description: Excisional Instrument: Curette Bleeding: Minimum Hemostasis Achieved: Silver Nitrate End Time: 16:21 Procedural Pain: 0 Post Procedural Pain: 0 Response to  Treatment: Procedure was tolerated well Level of Consciousness Awake and Alert (Post-procedure): Post Debridement Measurements of Total Wound Length: (cm) 1.2 Width: (cm) 1.3 Depth: (cm) 0.1 Volume: (cm) 0.123 Character of Wound/Ulcer Post Improved Debridement: Severity of Tissue Post Debridement: Fat layer exposed Post Procedure Diagnosis Same as Pre-procedure Electronic Signature(s) Signed: 06/12/2019 6:10:09 PM By: Worthy Keeler PA-C Signed: 06/12/2019 6:17:53 PM By: [name] RN, BSN Entered By: [name] on 06/12/2019 16:25:10 -------------------------------------------------------------------------------- HPI Details Patient Name: Date of Service: Tonya Hunter. 06/12/2019 3:00 PM Medical Record [id number] Patient Account Number: [id number] Date of Birth/Sex: Treating RN: 18-May-1934 (85 y.o. F) Primary Care Provider: [name] Other Clinician: Referring Provider: Treating Provider/Extender:Stone III, [name], [name] Weeks in Treatment: 3 History of Present Illness HPI Description: 05/22/2019 patient is seen today in regard to her bilateral lower extremities this was a referral from her primary care provider they were concerned about areas of bruising and contusion that happened on a regular basis. With that being said the patient really has an issue with her right great toe where she has a significant blister that wraps around to the underside of the toe that has me more concerned to be perfectly honest than the legs themselves do they seem to be doing quite well. I explained in regard to the legs she is good to have ongoing and continued issues with blisters based on the fragility of her skin as long as she is on the Plavix and aspirin this is likely to be an ongoing issue. Honestly what she is going need to do is protect that area. In regard to the toe however that started 2 days ago and she tells me it seems to be getting worse each  day. No fevers, chills, nausea, vomiting, or diarrhea. 05/29/2019 on evaluation today patient appears to be doing much better with regard to her leg which in fact appears to be completely healed. That is on the left. In regard to her right great toe this is showing signs of  improvement I am very pleased with how things seem to be progressing after we had removed the blistered skin down to an open wound last week. Overall I feel like there is new tissue growth and I am very excited that this in 1 week is doing so well. 06/05/2019 on evaluation today patient appears to be doing actually fairly well in regard to her toe ulcer. Unfortunately she does have a skin tear on the anterior surface of her right lower extremity which unfortunately is a little bit rolled under on the edge of the skin tear. Nonetheless I think we may be able to straighten this out and if so that would go a long way to help in this area to heal more effectively and quickly. Fortunately there is no signs of active infection at this time. She does have some swelling but again I think this is secondary to the skin tear as opposed to anything else. 06/12/2019 on evaluation today patient appears to be doing about the same in regard to the toe. With that being said she is having issues with the Steri-Strips having come off on her leg and there was some skin that actually necrosis and did slough off at this point. Fortunately there is no evidence of active infection at this time. She is very concerned about the possibility of amputation she states her daughter is also very concerned. With that being said I have not seen her daughter up to this point. Also have not really realized until today that the patient may have a component of dementia that may be preventing complete compliance and remembering of what needs to be done as far as taking care of her wound. Electronic Signature(s) Signed: 06/12/2019 4:32:37 PM By: Worthy Keeler  PA-C Entered By: Worthy Keeler on 06/12/2019 16:32:37 -------------------------------------------------------------------------------- Physical Exam Details Patient Name: Date of Service: Tonya Hunter, Tonya Hunter 06/12/2019 3:00 PM Medical Record [id number] Patient Account Number: [id number] Date of Birth/Sex: Treating RN: 03-Apr-1935 (85 y.o. F) Primary Care Provider: [name] Other Clinician: Referring Provider: Treating Provider/Extender:Stone III, [name], [name] Weeks in Treatment: 3 Constitutional Well-nourished and well-hydrated in no acute distress. Respiratory normal breathing without difficulty. Psychiatric this patient is able to make decisions and demonstrates good insight into disease process. Alert and Oriented x 3. pleasant and cooperative. Notes Patient's wound bed currently showed signs of good granulation in regard to the lower extremity at this point. The toe did have some slough noted actually did perform sharp debridement although she had a lot of bleeding secondary to her blood thinners am not sure how much we can to be able to really actively debride in the future as she seems to bleed significantly and that I had to use silver nitrate to chemically cauterize this. Nonetheless we will see how things go over the next several weeks I may switch to Iodoflex to try to help clean this up without having to go to debridement in the future. Right now we have been using Hydrofera Blue. Electronic Signature(s) Signed: 06/12/2019 4:33:19 PM By: Worthy Keeler PA-C Entered By: Worthy Keeler on 06/12/2019 16:33:19 -------------------------------------------------------------------------------- Physician Orders Details Patient Name: Date of Service: Tonya Hunter 06/12/2019 3:00 PM Medical Record [id number] Patient Account Number: [id number] Date of Birth/Sex: Treating RN: 10-21-1934 (85 y.o. [name] Primary Care Provider: [name] Other Clinician: Referring Provider: Treating Provider/Extender:Stone III, [name], [name] Weeks in Treatment: 3 Verbal / Phone Orders: No Diagnosis Coding ICD-10 Coding  Code Description I87.2 Venous insufficiency (chronic) (peripheral) L97.822 Non-pressure chronic ulcer of other part of left lower leg with fat layer exposed S81.801A Unspecified open wound, right lower leg, initial encounter E11.622 Type 2 diabetes mellitus with other skin ulcer L97.512 Non-pressure chronic ulcer of other part of right foot with fat layer exposed I10 Essential (primary) hypertension I50.42 Chronic combined systolic (congestive) and diastolic (congestive) heart failure I25.10 Atherosclerotic heart disease of native coronary artery without angina pectoris Follow-up Appointments Return Appointment in 1 week. Dressing Change Frequency Wound #2 Right,Medial Toe Great Change Dressing every other day. Wound #3 Right,Anterior Lower Leg Do not change entire dressing for one week. Wound Cleansing Wound #2 Right,Medial Toe Great Clean wound with Wound Cleanser Wound #3 Right,Anterior Lower Leg May shower with protection. - use cast protector to keep dry in the shower Primary Wound Dressing Wound #2 Right,Medial Toe Great Hydrofera Blue - ready Wound #3 Right,Anterior Lower Leg Hydrofera Blue Secondary Dressing Wound #2 Right,Medial Toe Great Foam - donut Kerlix/Rolled Gauze Dry Gauze Edema Control 3 Layer Compression System - Right Lower Extremity Avoid standing for long periods of time Elevate legs to the level of the heart or above for 30 minutes daily and/or when sitting, a frequency of: - whenever sitting throughout the day Exercise regularly Support Garment 20-30 mm/Hg pressure to: - pt to order compression stockings from [employer] Other: - always wear shoes when walking even in the house to protect feet Patient Medications Allergies: penicillin,  Demerol Notifications Medication Indication Start End lidocaine prior to 06/12/2019 debridement DOSE topical 5 % ointment - ointment topical Electronic Signature(s) Signed: 06/12/2019 6:10:09 PM By: Worthy Keeler PA-C Signed: 06/12/2019 6:17:53 PM By: [name] RN, BSN Entered By: [name] on 06/12/2019 16:24:36 -------------------------------------------------------------------------------- Problem List Details Patient Name: Date of Service: Tonya Hunter 06/12/2019 3:00 PM Medical Record [id number] Patient Account Number: [id number] Date of Birth/Sex: Treating RN: [birth year] (85 y.o. F) Primary Care Provider: [name] Other Clinician: Referring Provider: Treating Provider/Extender:Stone III, [name], [name] Weeks in Treatment: 3 Active Problems ICD-10 Evaluated Encounter Code Description Active Date Today Diagnosis I87.2 Venous insufficiency (chronic) (peripheral) 05/22/2019 No Yes L97.822 Non-pressure chronic ulcer of other part of left lower 05/22/2019 No Yes leg with fat layer exposed S81.801A Unspecified open wound, right lower leg, initial 06/05/2019 No Yes encounter E11.622 Type 2 diabetes mellitus with other skin ulcer 05/22/2019 No Yes L97.512 Non-pressure chronic ulcer of other part of right foot 05/22/2019 No Yes with fat layer exposed I10 Essential (primary) hypertension 05/22/2019 No Yes I50.42 Chronic combined systolic (congestive) and diastolic 05/16/5724 No Yes (congestive) heart failure I25.10 Atherosclerotic heart disease of native coronary artery 05/22/2019 No Yes without angina pectoris Inactive Problems Resolved Problems Electronic Signature(s) Signed: 06/12/2019 3:29:23 PM By: Worthy Keeler PA-C Entered By: Worthy Keeler on 06/12/2019 15:29:22 -------------------------------------------------------------------------------- Progress Note Details Patient Name: Date of Service: Tonya Hunter 06/12/2019 3:00 PM Medical  Record [id number] Patient Account Number: [id number] Date of Birth/Sex: Treating RN: Jan 01, 1935 (85 y.o. F) Primary Care Provider: [name] Other Clinician: Referring Provider: Treating Provider/Extender:Stone III, [name], [name] Weeks in Treatment: 3 Subjective Chief Complaint Information obtained from Patient Right LE skin tear and right 1st toe ulcer History of Present Illness (HPI) 05/22/2019 patient is seen today in regard to her bilateral lower extremities this was a referral from her primary care provider they were concerned about areas of bruising and contusion that happened on a regular basis. With  that being said the patient really has an issue with her right great toe where she has a significant blister that wraps around to the underside of the toe that has me more concerned to be perfectly honest than the legs themselves do they seem to be doing quite well. I explained in regard to the legs she is good to have ongoing and continued issues with blisters based on the fragility of her skin as long as she is on the Plavix and aspirin this is likely to be an ongoing issue. Honestly what she is going need to do is protect that area. In regard to the toe however that started 2 days ago and she tells me it seems to be getting worse each day. No fevers, chills, nausea, vomiting, or diarrhea. 05/29/2019 on evaluation today patient appears to be doing much better with regard to her leg which in fact appears to be completely healed. That is on the left. In regard to her right great toe this is showing signs of improvement I am very pleased with how things seem to be progressing after we had removed the blistered skin down to an open wound last week. Overall I feel like there is new tissue growth and I am very excited that this in 1 week is doing so well. 06/05/2019 on evaluation today patient appears to be doing actually fairly well in regard to her toe ulcer.  Unfortunately she does have a skin tear on the anterior surface of her right lower extremity which unfortunately is a little bit rolled under on the edge of the skin tear. Nonetheless I think we may be able to straighten this out and if so that would go a long way to help in this area to heal more effectively and quickly. Fortunately there is no signs of active infection at this time. She does have some swelling but again I think this is secondary to the skin tear as opposed to anything else. 06/12/2019 on evaluation today patient appears to be doing about the same in regard to the toe. With that being said she is having issues with the Steri-Strips having come off on her leg and there was some skin that actually necrosis and did slough off at this point. Fortunately there is no evidence of active infection at this time. She is very concerned about the possibility of amputation she states her daughter is also very concerned. With that being said I have not seen her daughter up to this point. Also have not really realized until today that the patient may have a component of dementia that may be preventing complete compliance and remembering of what needs to be done as far as taking care of her wound. Objective Constitutional Well-nourished and well-hydrated in no acute distress. Vitals Time Taken: 3:15 PM, Height: 66 in, Weight: 164 lbs, BMI: 26.5, Temperature: 98.2 F, Pulse: 70 bpm, Respiratory Rate: 18 breaths/min, Blood Pressure: 110/40 mmHg, Capillary Blood Glucose: 121 mg/dl. General Notes: glucose per pt report Respiratory normal breathing without difficulty. Psychiatric this patient is able to make decisions and demonstrates good insight into disease process. Alert and Oriented x 3. pleasant and cooperative. General Notes: Patient's wound bed currently showed signs of good granulation in regard to the lower extremity at this point. The toe did have some slough noted actually did  perform sharp debridement although she had a lot of bleeding secondary to her blood thinners am not sure how much we can to be able to really actively  debride in the future as she seems to bleed significantly and that I had to use silver nitrate to chemically cauterize this. Nonetheless we will see how things go over the next several weeks I may switch to Iodoflex to try to help clean this up without having to go to debridement in the future. Right now we have been using Hydrofera Blue. Integumentary (Hair, Skin) Wound #2 status is Open. Original cause of wound was Blister. The wound is located on the [employer]. The wound measures 1.2cm length x 1.3cm width x 0.1cm depth; 1.225cm^2 area and 0.123cm^3 volume. There is Fat Layer (Subcutaneous Tissue) Exposed exposed. There is no tunneling or undermining noted. There is a medium amount of serosanguineous drainage noted. The wound margin is flat and intact. There is small (1-33%) pink granulation within the wound bed. There is a large (67-100%) amount of necrotic tissue within the wound bed including Adherent Slough. Wound #3 status is Open. Original cause of wound was Gradually Appeared. The wound is located on the Right,Anterior Lower Leg. The wound measures 1.2cm length x 1cm width x 0.1cm depth; 0.942cm^2 area and 0.094cm^3 volume. There is Fat Layer (Subcutaneous Tissue) Exposed exposed. There is no tunneling or undermining noted. There is a medium amount of serosanguineous drainage noted. The wound margin is distinct with the outline attached to the wound base. There is large (67-100%) pink granulation within the wound bed. Assessment Active Problems ICD-10 Venous insufficiency (chronic) (peripheral) Non-pressure chronic ulcer of other part of left lower leg with fat layer exposed Unspecified open wound, right lower leg, initial encounter Type 2 diabetes mellitus with other skin ulcer Non-pressure chronic ulcer of other part of  right foot with fat layer exposed Essential (primary) hypertension Chronic combined systolic (congestive) and diastolic (congestive) heart failure Atherosclerotic heart disease of native coronary artery without angina pectoris Procedures Wound #2 Pre-procedure diagnosis of Wound #2 is a Diabetic Wound/Ulcer of the Lower Extremity located on the Right,Medial Toe Great .Severity of Tissue Pre Debridement is: Fat layer exposed. There was a Excisional Skin/Subcutaneous Tissue Debridement with a total area of 2.25 sq cm performed by Worthy Keeler, PA. With the following instrument(s): Curette to remove Viable and Non-Viable tissue/material. Material removed includes Callus, Subcutaneous Tissue, Slough, and Skin: Epidermis after achieving pain control using Lidocaine 5% topical ointment. No specimens were taken. A time out was conducted at 16:15, prior to the start of the procedure. A Minimum amount of bleeding was controlled with Silver Nitrate. The procedure was tolerated well with a pain level of 0 throughout and a pain level of 0 following the procedure. Post Debridement Measurements: 1.2cm length x 1.3cm width x 0.1cm depth; 0.123cm^3 volume. Character of Wound/Ulcer Post Debridement is improved. Severity of Tissue Post Debridement is: Fat layer exposed. Post procedure Diagnosis Wound #2: Same as Pre-Procedure Wound #3 Pre-procedure diagnosis of Wound #3 is a Skin Tear located on the Right,Anterior Lower Leg . There was a Three Layer Compression Therapy Procedure by [name], RN. Post procedure Diagnosis Wound #3: Same as Pre-Procedure Plan Follow-up Appointments: Return Appointment in 1 week. Dressing Change Frequency: Wound #2 Right,Medial Toe Great: Change Dressing every other day. Wound #3 Right,Anterior Lower Leg: Do not change entire dressing for one week. Wound Cleansing: Wound #2 Right,Medial Toe Great: Clean wound with Wound Cleanser Wound #3 Right,Anterior Lower  Leg: May shower with protection. - use cast protector to keep dry in the shower Primary Wound Dressing: Wound #2 Right,Medial Toe Great: Hydrofera Blue -  ready Wound #3 Right,Anterior Lower Leg: Hydrofera Blue Secondary Dressing: Wound #2 Right,Medial Toe Great: Foam - donut Kerlix/Rolled Gauze Dry Gauze Edema Control: 3 Layer Compression System - Right Lower Extremity Avoid standing for long periods of time Elevate legs to the level of the heart or above for 30 minutes daily and/or when sitting, a frequency of: - whenever sitting throughout the day Exercise regularly Support Garment 20-30 mm/Hg pressure to: - pt to order compression stockings from [employer]: Other: - always wear shoes when walking even in the house to protect feet The following medication(s) was prescribed: lidocaine topical 5 % ointment ointment topical for prior to debridement was prescribed at facility 1. My suggestion at this time is good to be that we go ahead and continue with the current wound care measures with the [facility] and she is in agreement with the plan. 2. Dominic consider as well that she may have some dementia I think we need to get in touch with the patient's daughter which we can work on as far as trying to discuss treatment options with her so she is in agreement as well the patient with tell me that the daughter was very worried. I have not met her daughter yet and I think that it may be good to include her in the care especially since the patient seems to be forgetting things even that I tell her here in the clinic she then turns around and seems to be somewhat confused about what I was saying. I do not want any irregularity as far as what we are doing currently. We will see patient back for reevaluation in 1 week here in the clinic. If anything worsens or changes patient will contact our office for additional recommendations. Electronic Signature(s) Signed: 06/12/2019  4:34:22 PM By: Worthy Keeler PA-C Entered By: Worthy Keeler on 06/12/2019 16:34:21 -------------------------------------------------------------------------------- SuperBill Details Patient Name: Date of Service: Tonya Hunter 06/12/2019 Medical Record [id number] Patient Account Number: [id number] Date of Birth/Sex: Treating RN: [birth year] (85 y.o. [name] Primary Care Provider: [name] Other Clinician: Referring Provider: Treating Provider/Extender:Stone III, [name], [name] Weeks in Treatment: 3 Diagnosis Coding ICD-10 Codes Code Description I87.2 Venous insufficiency (chronic) (peripheral) L97.822 Non-pressure chronic ulcer of other part of left lower leg with fat layer exposed S81.801A Unspecified open wound, right lower leg, initial encounter E11.622 Type 2 diabetes mellitus with other skin ulcer L97.512 Non-pressure chronic ulcer of other part of right foot with fat layer exposed I10 Essential (primary) hypertension I50.42 Chronic combined systolic (congestive) and diastolic (congestive) heart failure I25.10 Atherosclerotic heart disease of native coronary artery without angina pectoris Facility Procedures CPT4 Code Description: [id number] 11042 - DEB SUBQ TISSUE 20 SQ CM/< ICD-10 Diagnosis Description L97.512 Non-pressure chronic ulcer of other part of right foot with Modifier: fat layer e Quantity: 1 xposed Physician Procedures CPT4 Code Description: [id number] 11042 - WC PHYS SUBQ TISS 20 SQ CM ICD-10 Diagnosis Description L97.512 Non-pressure chronic ulcer of other part of right foot with Modifier: fat layer e Quantity: 1 xposed Electronic Signature(s) Signed: 06/12/2019 4:34:31 PM By: Worthy Keeler PA-C Entered By: Worthy Keeler on 06/12/2019 16:34:30

## 2019-06-13 ENCOUNTER — Other Ambulatory Visit: Payer: Self-pay

## 2019-06-13 ENCOUNTER — Ambulatory Visit: Payer: Medicare Other | Admitting: Physical Therapy

## 2019-06-13 ENCOUNTER — Encounter: Payer: Self-pay | Admitting: Physical Therapy

## 2019-06-13 DIAGNOSIS — M6281 Muscle weakness (generalized): Secondary | ICD-10-CM

## 2019-06-13 DIAGNOSIS — R2689 Other abnormalities of gait and mobility: Secondary | ICD-10-CM

## 2019-06-13 DIAGNOSIS — R262 Difficulty in walking, not elsewhere classified: Secondary | ICD-10-CM | POA: Diagnosis not present

## 2019-06-13 DIAGNOSIS — R2681 Unsteadiness on feet: Secondary | ICD-10-CM | POA: Diagnosis not present
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
## 2019-06-16 LAB — CUP PACEART REMOTE DEVICE CHECK
Date Time Interrogation Session: 20210131005829
Implantable Pulse Generator Implant Date: 20200615

## 2019-06-17 ENCOUNTER — Ambulatory Visit (INDEPENDENT_AMBULATORY_CARE_PROVIDER_SITE_OTHER): Payer: Medicare Other | Admitting: *Deleted

## 2019-06-17 DIAGNOSIS — R2990 Unspecified symptoms and signs involving the nervous system: Secondary | ICD-10-CM | POA: Diagnosis not present

## 2019-06-17 NOTE — Progress Notes (Signed)
ILR Remote 

## 2019-06-17 NOTE — Progress Notes (Addendum)
Tonya Hunter ([id number]) Visit Report for 06/12/2019 Arrival Information Details Patient Name: Date of Service: Tonya Hunter, Tonya Hunter 06/12/2019 3:00 PM Medical Record [id number] Patient Account Number: [id number] Date of Birth/Sex: Treating RN: 03/24/1935 (85 y.o. Female) Levan Hurst Primary Care Blaze Sandin: [name] Other Clinician: Referring Consuella Scurlock: Treating Frona Yost/Extender:Stone III, [name], [name] Weeks in Treatment: 3 Visit Information History Since Last Visit Added or deleted any medications: No Patient Arrived: Tonya Hunter Any new allergies or adverse reactions: No Arrival Time: 15:15 Had a fall or experienced change in No Accompanied By: alone activities of daily living that may affect Transfer Assistance: None risk of falls: Patient Identification Verified: Yes Signs or symptoms of abuse/neglect since last No Secondary Verification Process Yes visito Completed: Hospitalized since last visit: No Patient Requires Transmission- No Implantable device outside of the clinic excluding No Based Precautions: cellular tissue based products placed in the center Patient Has Alerts: Yes since last visit: Patient Alerts: Patient on Blood Has Dressing in Place as Prescribed: Yes Thinner Pain Present Now: No L ABI non compressible Electronic Signature(s) Signed: 06/17/2019 6:39:44 PM By: Levan Hurst RN, BSN Entered By: Levan Hurst on 06/12/2019 15:15:46 -------------------------------------------------------------------------------- Compression Therapy Details Patient Name: Date of Service: Tonya Hunter 06/12/2019 3:00 PM Medical Record [id number] Patient Account Number: [id number] Date of Birth/Sex: [birth year] (84 y.o. Female) Treating RN: [name] Primary Care Jaymes Revels: [name] Other Clinician: Referring Meghen Akopyan: Treating Alexiz Cothran/Extender:Stone III, [name], [name] Weeks in Treatment: 3 Compression  Therapy Performed for Wound Wound #3 Right,Anterior Lower Leg Assessment: Performed By: Jake Church, RN Compression Type: Three Layer Post Procedure Diagnosis Same as Pre-procedure Electronic Signature(s) Signed: 06/12/2019 6:17:53 PM By: [name] RN, BSN Entered By: [name] on 06/12/2019 16:26:01 -------------------------------------------------------------------------------- Encounter Discharge Information Details Patient Name: Date of Service: Tonya Hunter 06/12/2019 3:00 PM Medical Record [id number] Patient Account Number: [id number] Date of Birth/Sex: Treating RN: 1934-10-24 (85 y.o. Female) [name] Primary Care Bannie Lobban: [name] Other Clinician: Referring Jazminn Pomales: Treating Lovinia Snare/Extender:Stone III, [name], [name] Weeks in Treatment: 3 Encounter Discharge Information Items Post Procedure Vitals Discharge Condition: Stable Temperature (F): 98.2 Ambulatory Status: Walker Pulse (bpm): 70 Discharge Destination: Home Respiratory Rate (breaths/min): 18 Transportation: Private Auto Blood Pressure (mmHg): 112/50 Accompanied By: self Schedule Follow-up Appointment: Yes Clinical Summary of Care: Patient Declined Electronic Signature(s) Signed: 06/12/2019 5:50:55 PM By: [name] RN Entered By: [name] on 06/12/2019 16:53:50 -------------------------------------------------------------------------------- Lower Extremity Assessment Details Patient Name: Date of Service: Tonya Hunter, Tonya Hunter 06/12/2019 3:00 PM Medical Record [id number] Patient Account Number: [id number] Date of Birth/Sex: Treating RN: 01-26-1935 (85 y.o. Female) Levan Hurst Primary Care Dinesha Twiggs: [name] Other Clinician: Referring Silver Parkey: Treating Tanasha Menees/Extender:Stone III, [name], [name] Weeks in Treatment: 3 Edema Assessment Assessed: [Left: No] [Right: No] Edema: [Left: Yes] [Right: Yes] Calf Left:  Right: Point of Measurement: 31 cm From Medial Instep cm 36 cm Ankle Left: Right: Point of Measurement: 9 cm From Medial Instep cm 26 cm Vascular Assessment Pulses: Dorsalis Pedis Palpable: [Right:Yes] Electronic Signature(s) Signed: 06/17/2019 6:39:44 PM By: Levan Hurst RN, BSN Entered By: Levan Hurst on 06/12/2019 15:17:09 -------------------------------------------------------------------------------- [place] Details Patient Name: Date of Service: Tonya Hunter 06/12/2019 3:00 PM Medical Record [id number] Patient Account Number: [id number] Date of Birth/Sex: Treating RN: Aug 30, 1934 (85 y.o. Female) [name] Primary Care Brithney Bensen: [name] Other Clinician: Referring Emileigh Kellett: Treating Laquitha Heslin/Extender:Stone III, [name], [name] Weeks in Treatment: 3 Active Inactive Abuse / Safety /  Falls / Self Care Management Nursing Diagnoses: Potential for falls Goals: Patient/caregiver will verbalize/demonstrate measures taken to prevent injury and/or falls Date Initiated: 05/22/2019 Target Resolution Date: 06/19/2019 Goal Status: Active Interventions: Assess fall risk on admission and as needed Assess impairment of mobility on admission and as needed per policy Assess personal safety and home safety (as indicated) on admission and as needed Notes: Nutrition Nursing Diagnoses: Impaired glucose control: actual or potential Goals: Patient/caregiver will maintain therapeutic glucose control Date Initiated: 05/22/2019 Target Resolution Date: 06/19/2019 Goal Status: Active Interventions: Assess HgA1c results as ordered upon admission and as needed Assess patient nutrition upon admission and as needed per policy Provide education on elevated blood sugars and impact on wound healing Treatment Activities: Patient referred to Primary Care Physician for further nutritional evaluation : 05/22/2019 Notes: Wound/Skin Impairment Nursing  Diagnoses: Impaired tissue integrity Knowledge deficit related to ulceration/compromised skin integrity Goals: Patient/caregiver will verbalize understanding of skin care regimen Date Initiated: 05/22/2019 Target Resolution Date: 06/19/2019 Goal Status: Active Ulcer/skin breakdown will have a volume reduction of 30% by week 4 Date Initiated: 05/22/2019 Target Resolution Date: 06/19/2019 Goal Status: Active Interventions: Assess patient/caregiver ability to obtain necessary supplies Assess patient/caregiver ability to perform ulcer/skin care regimen upon admission and as needed Assess ulceration(s) every visit Provide education on ulcer and skin care Treatment Activities: Skin care regimen initiated : 05/22/2019 Topical wound management initiated : 05/22/2019 Notes: Electronic Signature(s) Signed: 06/12/2019 6:17:53 PM By: [name] RN, BSN Entered By: [name] on 06/12/2019 16:15:51 -------------------------------------------------------------------------------- Pain Assessment Details Patient Name: Date of Service: Tonya Hunter 06/12/2019 3:00 PM Medical Record [id number] Patient Account Number: [id number] Date of Birth/Sex: Treating RN: [birth year] (85 y.o. Female) Levan Hurst Primary Care Jaeanna Mccomber: [name] Other Clinician: Referring Elias Bordner: Treating Larah Kuntzman/Extender:Stone III, [name], [name] Weeks in Treatment: 3 Active Problems Location of Pain Severity and Description of Pain Patient Has Paino No Site Locations Pain Management and Medication Current Pain Management: Electronic Signature(s) Signed: 06/17/2019 6:39:44 PM By: Levan Hurst RN, BSN Entered By: Levan Hurst on 06/12/2019 15:16:29 -------------------------------------------------------------------------------- Patient/Caregiver Education Details Patient Name: Tonya Hunter 1/27/2021andnbsp3:00 Date of Service: PM Medical Record [id number] Number: Patient Account  Number: [id number] Treating RN: Sep 15, 1934 (84 y.o. [name] Date of Birth/Gender: Female) Other Clinician: Primary Care Treating Salome Spotted Physician: Physician/Extender: Referring Physician: Denyse Dago in Treatment: 3 Education Assessment Education Provided To: Patient Education Topics Provided Offloading: Methods: Explain/Verbal Responses: Reinforcements needed, State content correctly Wound/Skin Impairment: Methods: Explain/Verbal Responses: Reinforcements needed, State content correctly Electronic Signature(s) Signed: 06/12/2019 6:17:53 PM By: [name] RN, BSN Entered By: [name] on 06/12/2019 16:16:13 -------------------------------------------------------------------------------- Wound Assessment Details Patient Name: Date of Service: Tonya Hunter 06/12/2019 3:00 PM Medical Record [id number] Patient Account Number: [id number] Date of Birth/Sex: Treating RN: 07/01/1934 (85 y.o. Female) Primary Care Hephzibah Strehle: [name] Other Clinician: Referring Roma Bierlein: Treating Linley Moxley/Extender:Stone III, [name], [name] Weeks in Treatment: 3 Wound Status Wound Number: 2 Primary Diabetic Wound/Ulcer of the Lower Extremity Etiology: Wound Location: Right Toe Great - Medial Wound Open Wounding Event: Blister Status: Date Acquired: 05/22/2019 Comorbid Cataracts, Anemia, Angina, Congestive Heart Weeks Of Treatment: 3 History: Failure, Coronary Artery Disease, Clustered Wound: No Hypertension, Myocardial Infarction, Type II Diabetes, Osteoarthritis, Neuropathy Photos Wound Measurements Length: (cm) 1.2 Width: (cm) 1.3 Depth: (cm) 0.1 Area: (cm) 1.225 Volume: (cm) 0.123 Wound Description Classification: Grade 2 Wound Margin: Flat and Intact Exudate Amount: Medium Exudate Type: Serosanguineous Exudate Color: red,  brown Wound Bed Granulation Amount: Small (1-33%) Granulation Quality:  Pink Necrotic Amount: Large (67-100%) Necrotic Quality: Adherent Slough After Cleansing: No rino Yes Exposed Structure osed: No Subcutaneous Tissue) Exposed: Yes sed: No sed: No ed: No d: No % Reduction in Area: 83% % Reduction in Volume: 83% Epithelialization: Medium (34-66%) Tunneling: No Undermining: No Foul Odor Slough/Fib Fascia Exp Fat Layer ( Tendon Expo Muscle Expo Joint Expos Bone Expose Treatment Notes Wound #2 (Right, Medial Toe Great) 1. Cleanse With Wound Cleanser Soap and water 3. Primary Dressing Applied Hydrofera Blue 4. Secondary Dressing Dry Gauze Notes netting Electronic Signature(s) Signed: 06/25/2019 4:28:10 PM By: Mikeal Hawthorne EMT/HBOT Previous Signature: 06/17/2019 6:39:44 PM Version By: Levan Hurst RN, BSN Entered By: Mikeal Hawthorne on 06/25/2019 13:50:41 -------------------------------------------------------------------------------- Wound Assessment Details Patient Name: Date of Service: Tonya Hunter 06/12/2019 3:00 PM Medical Record [id number] Patient Account Number: [id number] Date of Birth/Sex: Treating RN: 04/09/1935 (84 y.o. Female) Primary Care Vicktoria Muckey: [name] Other Clinician: Referring Lucan Riner: Treating Loryn Haacke/Extender:Stone III, [name], [name] Weeks in Treatment: 3 Wound Status Wound Number: 3 Primary Skin Tear Etiology: Wound Location: Right Lower Leg - Anterior Wound Open Wounding Event: Gradually Appeared Status: Date Acquired: 05/29/2019 Comorbid Cataracts, Anemia, Angina, Congestive Heart Weeks Of Treatment: 1 History: Failure, Coronary Artery Disease, Clustered Wound: No Hypertension, Myocardial Infarction, Type II Diabetes, Osteoarthritis, Neuropathy Photos Wound Measurements Length: (cm) 1.2 % Reduct Width: (cm) 1 % Reduct Depth: (cm) 0.1 Epitheli Area: (cm) 0.942 Tunneli Volume: (cm) 0.094 Undermi Wound Description Classification: Full Thickness Without Exposed  Support Foul Od Structures Slough/ Wound Distinct, outline attached Margin: Exudate Medium Amount: Exudate Serosanguineous Type: Exudate red, brown Color: Wound Bed Granulation Amount: Large (67-100%) Granulation Quality: Pink Fascia E Fat Laye Tendon E Muscle E Joint Ex Bone Exp or After Cleansing: No Fibrino No Exposed Structure xposed: No r (Subcutaneous Tissue) Exposed: Yes xposed: No xposed: No posed: No osed: No ion in Area: 38.5% ion in Volume: 38.6% alization: Small (1-33%) ng: No ning: No Treatment Notes Wound #3 (Right, Anterior Lower Leg) 1. Cleanse With Wound Cleanser Soap and water 3. Primary Dressing Applied Hydrofera Blue 4. Secondary Dressing Dry Gauze 6. Support Layer Applied 3 layer compression wrap Notes netting Electronic Signature(s) Signed: 06/25/2019 4:28:10 PM By: Mikeal Hawthorne EMT/HBOT Previous Signature: 06/17/2019 6:39:44 PM Version By: Levan Hurst RN, BSN Entered By: Mikeal Hawthorne on 06/25/2019 13:51:00 -------------------------------------------------------------------------------- Vitals Details Patient Name: Date of Service: Tonya Hunter. 06/12/2019 3:00 PM Medical Record [id number] Patient Account Number: [id number] Date of Birth/Sex: Treating RN: 1934/10/25 (85 y.o. Female) Levan Hurst Primary Care Addalee Kavanagh: [name] Other Clinician: Referring Pandora Mccrackin: Treating Eliane Hammersmith/Extender:Stone III, [name], [name] Weeks in Treatment: 3 Vital Signs Time Taken: 15:15 Temperature (F): 98.2 Height (in): 66 Pulse (bpm): 70 Weight (lbs): 164 Respiratory Rate (breaths/min): 18 Body Mass Index (BMI): 26.5 Blood Pressure (mmHg): 110/40 Capillary Blood Glucose (mg/dl): 121 Reference Range: 80 - 120 mg / dl Notes glucose per pt report Electronic Signature(s) Signed: 06/17/2019 6:39:44 PM By: Levan Hurst RN, BSN Entered By: Levan Hurst on 06/12/2019 15:16:21

## 2019-06-19 ENCOUNTER — Other Ambulatory Visit: Payer: Self-pay

## 2019-06-19 ENCOUNTER — Ambulatory Visit (HOSPITAL_COMMUNITY)
Admission: RE | Admit: 2019-06-19 | Discharge: 2019-06-19 | Disposition: A | Discharge status: Home / Self Care | Payer: Medicare Other | Source: Ambulatory Visit | Attending: Physician Assistant | Admitting: Physician Assistant

## 2019-06-19 ENCOUNTER — Encounter (HOSPITAL_BASED_OUTPATIENT_CLINIC_OR_DEPARTMENT_OTHER): Payer: Medicare Other | Admitting: Physician Assistant

## 2019-06-19 ENCOUNTER — Other Ambulatory Visit (HOSPITAL_COMMUNITY): Payer: Self-pay | Admitting: Physician Assistant

## 2019-06-19 DIAGNOSIS — L97812 Non-pressure chronic ulcer of other part of right lower leg with fat layer exposed: Secondary | ICD-10-CM | POA: Diagnosis not present

## 2019-06-19 DIAGNOSIS — I5042 Chronic combined systolic (congestive) and diastolic (congestive) heart failure: Secondary | ICD-10-CM | POA: Diagnosis not present

## 2019-06-19 DIAGNOSIS — I110 Hypertensive heart disease with heart failure: Secondary | ICD-10-CM | POA: Diagnosis not present

## 2019-06-19 DIAGNOSIS — S81811A Laceration without foreign body, right lower leg, initial encounter: Secondary | ICD-10-CM | POA: Diagnosis not present

## 2019-06-19 DIAGNOSIS — E11622 Type 2 diabetes mellitus with other skin ulcer: Secondary | ICD-10-CM | POA: Insufficient documentation

## 2019-06-19 DIAGNOSIS — L97512 Non-pressure chronic ulcer of other part of right foot with fat layer exposed: Secondary | ICD-10-CM | POA: Diagnosis not present

## 2019-06-19 DIAGNOSIS — E11621 Type 2 diabetes mellitus with foot ulcer: Secondary | ICD-10-CM | POA: Diagnosis not present

## 2019-06-19 DIAGNOSIS — E118 Type 2 diabetes mellitus with unspecified complications: Secondary | ICD-10-CM

## 2019-06-19 DIAGNOSIS — I872 Venous insufficiency (chronic) (peripheral): Secondary | ICD-10-CM | POA: Insufficient documentation

## 2019-06-19 DIAGNOSIS — I2510 Atherosclerotic heart disease of native coronary artery without angina pectoris: Secondary | ICD-10-CM | POA: Diagnosis not present

## 2019-06-19 DIAGNOSIS — Z880 Allergy status to penicillin: Secondary | ICD-10-CM | POA: Diagnosis not present

## 2019-06-19 DIAGNOSIS — S91101A Unspecified open wound of right great toe without damage to nail, initial encounter: Secondary | ICD-10-CM | POA: Diagnosis not present

## 2019-06-19 NOTE — Progress Notes (Addendum)
Tonya Hunter, Tonya Hunter ([id number]) Visit Report for 06/19/2019 Chief Complaint Document Details Patient Name: Date of Service: Tonya Hunter 06/19/2019 3:00 PM Medical Record [id number] Patient Account Number: [id number] Date of Birth/Sex: Treating RN: [birth year] (85 y.o. Female) Primary Care Provider: [name] Other Clinician: Referring Provider: Treating Provider/Extender:Stone III, [name], [name] Weeks in Treatment: 4 Information Obtained from: Patient Chief Complaint Right LE skin tear and right 1st toe ulcer Electronic Signature(s) Signed: 06/19/2019 3:55:14 PM By: Worthy Keeler PA-C Entered By: Worthy Keeler on 06/19/2019 15:55:13 -------------------------------------------------------------------------------- Debridement Details Patient Name: Date of Service: Tonya Hunter. 06/19/2019 3:00 PM Medical Record [id number] Patient Account Number: [id number] Date of Birth/Sex: 02/06/1935 (84 y.o. Female) Treating RN: [name] Primary Care Provider: [name] Other Clinician: Referring Provider: Treating Provider/Extender:Stone III, [name], [name] Weeks in Treatment: 4 Debridement Performed for Wound #2 Right,Medial Toe Great Assessment: Performed By: Physician Worthy Keeler, PA Debridement Type: Debridement Severity of Tissue Pre Fat layer exposed Debridement: Level of Consciousness (Pre- Awake and Alert procedure): Pre-procedure Verification/Time Out Taken: Yes - 16:25 Start Time: 16:27 Pain Control: Lidocaine 4% Topical Solution Total Area Debrided (L x W): 1 (cm) x 0.7 (cm) = 0.7 (cm) Tissue and other material Viable, Non-Viable, Callus, Slough, Subcutaneous, Skin: Epidermis, Slough debrided: Level: Skin/Subcutaneous Tissue Debridement Description: Excisional Instrument: Curette Bleeding: Minimum Hemostasis Achieved: Pressure End Time: 16:31 Procedural Pain: 0 Post Procedural Pain: 0 Response to Treatment:  Procedure was tolerated well Level of Consciousness Awake and Alert (Post-procedure): Post Debridement Measurements of Total Wound Length: (cm) 1 Width: (cm) 0.7 Depth: (cm) 0.4 Volume: (cm) 0.22 Character of Wound/Ulcer Post Improved Debridement: Severity of Tissue Post Debridement: Fat layer exposed Post Procedure Diagnosis Same as Pre-procedure Electronic Signature(s) Signed: 06/19/2019 6:36:45 PM By: [name] RN, BSN Signed: 06/19/2019 7:52:58 PM By: Worthy Keeler PA-C Entered By: [name] on 06/19/2019 16:30:31 -------------------------------------------------------------------------------- HPI Details Patient Name: Date of Service: Tonya Hunter 06/19/2019 3:00 PM Medical Record [name] Patient Account Number: [id number] Date of Birth/Sex: Treating RN: [birth year] (85 y.o. Female) Primary Care Provider: [name] Other Clinician: Referring Provider: Treating Provider/Extender:Stone III, [name], [name] Weeks in Treatment: 4 History of Present Illness HPI Description: 05/22/2019 patient is seen today in regard to her bilateral lower extremities this was a referral from her primary care provider they were concerned about areas of bruising and contusion that happened on a regular basis. With that being said the patient really has an issue with her right great toe where she has a significant blister that wraps around to the underside of the toe that has me more concerned to be perfectly honest than the legs themselves do they seem to be doing quite well. I explained in regard to the legs she is good to have ongoing and continued issues with blisters based on the fragility of her skin as long as she is on the Plavix and aspirin this is likely to be an ongoing issue. Honestly what she is going need to do is protect that area. In regard to the toe however that started 2 days ago and she tells me it seems to be getting worse each day. No  fevers, chills, nausea, vomiting, or diarrhea. 05/29/2019 on evaluation today patient appears to be doing much better with regard to her leg which in fact appears to be completely healed. That is on the left. In regard to her right great toe this is showing signs of improvement  I am very pleased with how things seem to be progressing after we had removed the blistered skin down to an open wound last week. Overall I feel like there is new tissue growth and I am very excited that this in 1 week is doing so well. 06/05/2019 on evaluation today patient appears to be doing actually fairly well in regard to her toe ulcer. Unfortunately she does have a skin tear on the anterior surface of her right lower extremity which unfortunately is a little bit rolled under on the edge of the skin tear. Nonetheless I think we may be able to straighten this out and if so that would go a long way to help in this area to heal more effectively and quickly. Fortunately there is no signs of active infection at this time. She does have some swelling but again I think this is secondary to the skin tear as opposed to anything else. 06/12/2019 on evaluation today patient appears to be doing about the same in regard to the toe. With that being said she is having issues with the Steri-Strips having come off on her leg and there was some skin that actually necrosis and did slough off at this point. Fortunately there is no evidence of active infection at this time. She is very concerned about the possibility of amputation she states her daughter is also very concerned. With that being said I have not seen her daughter up to this point. Also have not really realized until today that the patient may have a component of dementia that may be preventing complete compliance and remembering of what needs to be done as far as taking care of her wound. 06/19/2019 on evaluation today patient appears to be doing well with regard to her lower  extremity ulcer the toe ulcer is measuring better size wise but does have a little bit more depth. Again I do think we may want proceed with an x- ray at this point in order to further evaluate this and see if there is anything we need to do to aid in getting this to heal more appropriately and quickly. Fortunately there is no signs of systemic infection at this time. Electronic Signature(s) Signed: 06/19/2019 4:36:17 PM By: Worthy Keeler PA-C Entered By: Worthy Keeler on 06/19/2019 16:36:17 -------------------------------------------------------------------------------- Physical Exam Details Patient Name: Date of Service: FLORANCE, PAOLILLO 06/19/2019 3:00 PM Medical Record [id number] Patient Account Number: [id number] Date of Birth/Sex: Treating RN: May 21, 1934 (84 y.o. Female) Primary Care Provider: [name] Other Clinician: Referring Provider: Treating Provider/Extender:Stone III, [name], [name] Weeks in Treatment: 4 Constitutional Well-nourished and well-hydrated in no acute distress. Respiratory normal breathing without difficulty. Psychiatric this patient is able to make decisions and demonstrates good insight into disease process. Alert and Oriented x 3. pleasant and cooperative. Notes His wound bed currently did require sharp debridement in regard to the toe to remove slough and necrotic debris from the surface of the toe. I also did have to remove some callus as well post debridement the wound bed appears to be doing much better there is no direct bone exposure but I do worry about potential for osteomyelitis I would like to obtain a x-ray at this point to further evaluate the situation. Electronic Signature(s) Signed: 06/19/2019 4:36:40 PM By: Worthy Keeler PA-C Entered By: Worthy Keeler on 06/19/2019 16:36:39 -------------------------------------------------------------------------------- Physician Orders Details Patient Name: Date of  Service: Tonya Hunter 06/19/2019 3:00 PM Medical Record [id number] Patient Account Number:  [id number] Date of Birth/Sex: Treating RN: November 13, 1934 (85 y.o. Female) [name] Primary Care Provider: [name] Other Clinician: Referring Provider: Treating Provider/Extender:Stone III, [name], [name] Weeks in Treatment: 4 Verbal / Phone Orders: No Diagnosis Coding ICD-10 Coding Code Description I87.2 Venous insufficiency (chronic) (peripheral) L97.822 Non-pressure chronic ulcer of other part of left lower leg with fat layer exposed S81.801A Unspecified open wound, right lower leg, initial encounter E11.622 Type 2 diabetes mellitus with other skin ulcer L97.512 Non-pressure chronic ulcer of other part of right foot with fat layer exposed I10 Essential (primary) hypertension I50.42 Chronic combined systolic (congestive) and diastolic (congestive) heart failure I25.10 Atherosclerotic heart disease of native coronary artery without angina pectoris Follow-up Appointments Return Appointment in 1 week. Dressing Change Frequency Wound #2 Right,Medial Toe Great Change Dressing every other day. Wound #3 Right,Anterior Lower Leg Do not change entire dressing for one week. Wound Cleansing Wound #2 Right,Medial Toe Great Clean wound with Wound Cleanser Wound #3 Right,Anterior Lower Leg May shower with protection. - use cast protector to keep dry in the shower Primary Wound Dressing Wound #2 Right,Medial Toe Great Hydrofera Blue - ready Wound #3 Right,Anterior Lower Leg Hydrofera Blue Secondary Dressing Wound #2 Right,Medial Toe Great Foam - donut Kerlix/Rolled Gauze Dry Gauze Edema Control 3 Layer Compression System - Right Lower Extremity Avoid standing for long periods of time Elevate legs to the level of the heart or above for 30 minutes daily and/or when sitting, a frequency of: - whenever sitting throughout the day Exercise regularly Support Garment  20-30 mm/Hg pressure to: - to left leg daily Off-Loading Other: - always wear shoes when walking even in the house to protect feet Radiology X-ray, foot complete view right - diabetic foot ulcer right great toe - (ICD10 L97.512 - Non-pressure chronic ulcer of other part of right foot with fat layer exposed) Patient Medications Allergies: penicillin, Demerol Notifications Medication Indication Start End doxycycline hyclate 06/20/2019 DOSE 1 - oral 100 mg capsule - 1 capsule oral taken 2 times a day for 14 days. Do not take the PreserVision AREDS-2 while on this antibiotic Electronic Signature(s) Signed: 06/20/2019 1:28:11 PM By: Worthy Keeler PA-C Previous Signature: 06/19/2019 6:36:45 PM Version By: [name] RN, BSN Previous Signature: 06/19/2019 7:52:58 PM Version By: Worthy Keeler PA-C Entered By: Worthy Keeler on 06/20/2019 13:28:09 -------------------------------------------------------------------------------- Prescription 06/19/2019 Patient Name: Tonya Hunter Provider: Worthy Keeler PA Date of Birth: 1934-08-26 NPI#: [id number] Sex: Female DEA#: [id number] Phone #: [phone] License #: Patient Address: [address] [place], [place] [address] [place], [place] [id number] Allergies penicillin Reaction: swelling Severity: Moderate Demerol Reaction: nausea/vomiting Severity: Moderate Provider's Orders X-ray, foot complete view right - ICD10: L97.512 - diabetic foot ulcer right great toe Signature(s): Date(s): Electronic Signature(s) Signed: 06/25/2019 4:07:59 PM By: Worthy Keeler PA-C Previous Signature: 06/19/2019 6:36:45 PM Version By: [name] RN, BSN Previous Signature: 06/19/2019 7:52:58 PM Version By: Worthy Keeler PA-C Entered By: Worthy Keeler on 06/20/2019 13:28:11 --------------------------------------------------------------------------------  Problem  List Details Patient Name: Date of Service: Tonya Hunter 06/19/2019 3:00 PM Medical Record [id number] Patient Account Number: [id number] Date of Birth/Sex: Treating RN: [birth year] (85 y.o. Female) Primary Care Provider: [name] Other Clinician: Referring Provider: Treating Provider/Extender:Stone III, [name], [name] Weeks in Treatment: 4 Active Problems ICD-10 Evaluated Encounter Code Description Active Date Today Diagnosis I87.2 Venous insufficiency (chronic) (peripheral) 05/22/2019  No Yes L97.822 Non-pressure chronic ulcer of other part of left lower 05/22/2019 No Yes leg with fat layer exposed S81.801A Unspecified open wound, right lower leg, initial 06/05/2019 No Yes encounter E11.622 Type 2 diabetes mellitus with other skin ulcer 05/22/2019 No Yes L97.512 Non-pressure chronic ulcer of other part of right foot 05/22/2019 No Yes with fat layer exposed I10 Essential (primary) hypertension 05/22/2019 No Yes I50.42 Chronic combined systolic (congestive) and diastolic 11/18/8113 No Yes (congestive) heart failure I25.10 Atherosclerotic heart disease of native coronary artery 05/22/2019 No Yes without angina pectoris Inactive Problems Resolved Problems Electronic Signature(s) Signed: 06/19/2019 3:55:06 PM By: Worthy Keeler PA-C Entered By: Worthy Keeler on 06/19/2019 15:55:06 -------------------------------------------------------------------------------- Progress Note Details Patient Name: Date of Service: Tonya Hunter 06/19/2019 3:00 PM Medical Record [id number] Patient Account Number: [id number] Date of Birth/Sex: Treating RN: 12/04/1934 (85 y.o. Female) Primary Care Provider: [name] Other Clinician: Referring Provider: Treating Provider/Extender:Stone III, [name], [name] Weeks in Treatment: 4 Subjective Chief Complaint Information obtained from Patient Right LE skin tear and right 1st toe ulcer History of Present Illness  (HPI) 05/22/2019 patient is seen today in regard to her bilateral lower extremities this was a referral from her primary care provider they were concerned about areas of bruising and contusion that happened on a regular basis. With that being said the patient really has an issue with her right great toe where she has a significant blister that wraps around to the underside of the toe that has me more concerned to be perfectly honest than the legs themselves do they seem to be doing quite well. I explained in regard to the legs she is good to have ongoing and continued issues with blisters based on the fragility of her skin as long as she is on the Plavix and aspirin this is likely to be an ongoing issue. Honestly what she is going need to do is protect that area. In regard to the toe however that started 2 days ago and she tells me it seems to be getting worse each day. No fevers, chills, nausea, vomiting, or diarrhea. 05/29/2019 on evaluation today patient appears to be doing much better with regard to her leg which in fact appears to be completely healed. That is on the left. In regard to her right great toe this is showing signs of improvement I am very pleased with how things seem to be progressing after we had removed the blistered skin down to an open wound last week. Overall I feel like there is new tissue growth and I am very excited that this in 1 week is doing so well. 06/05/2019 on evaluation today patient appears to be doing actually fairly well in regard to her toe ulcer. Unfortunately she does have a skin tear on the anterior surface of her right lower extremity which unfortunately is a little bit rolled under on the edge of the skin tear. Nonetheless I think we may be able to straighten this out and if so that would go a long way to help in this area to heal more effectively and quickly. Fortunately there is no signs of active infection at this time. She does have some swelling but again  I think this is secondary to the skin tear as opposed to anything else. 06/12/2019 on evaluation today patient appears to be doing about the same in regard to the toe. With that being said she is having issues with the Steri-Strips having come off on her  leg and there was some skin that actually necrosis and did slough off at this point. Fortunately there is no evidence of active infection at this time. She is very concerned about the possibility of amputation she states her daughter is also very concerned. With that being said I have not seen her daughter up to this point. Also have not really realized until today that the patient may have a component of dementia that may be preventing complete compliance and remembering of what needs to be done as far as taking care of her wound. 06/19/2019 on evaluation today patient appears to be doing well with regard to her lower extremity ulcer the toe ulcer is measuring better size wise but does have a little bit more depth. Again I do think we may want proceed with an x- ray at this point in order to further evaluate this and see if there is anything we need to do to aid in getting this to heal more appropriately and quickly. Fortunately there is no signs of systemic infection at this time. Objective Constitutional Well-nourished and well-hydrated in no acute distress. Vitals Time Taken: 4:03 PM, Height: 66 in, Weight: 164 lbs, BMI: 26.5, Temperature: 97.9 F, Pulse: 73 bpm, Respiratory Rate: 20 breaths/min, Blood Pressure: 129/55 mmHg, Capillary Blood Glucose: 141 mg/dl. Respiratory normal breathing without difficulty. Psychiatric this patient is able to make decisions and demonstrates good insight into disease process. Alert and Oriented x 3. pleasant and cooperative. General Notes: His wound bed currently did require sharp debridement in regard to the toe to remove slough and necrotic debris from the surface of the toe. I also did have to remove  some callus as well post debridement the wound bed appears to be doing much better there is no direct bone exposure but I do worry about potential for osteomyelitis I would like to obtain a x-ray at this point to further evaluate the situation. Integumentary (Hair, Skin) Wound #2 status is Open. Original cause of wound was Blister. The wound is located on the [employer]. The wound measures 1cm length x 0.7cm width x 0.4cm depth; 0.55cm^2 area and 0.22cm^3 volume. There is Fat Layer (Subcutaneous Tissue) Exposed exposed. There is no tunneling or undermining noted. There is a medium amount of serosanguineous drainage noted. The wound margin is flat and intact. There is medium (34-66%) pink granulation within the wound bed. There is a medium (34-66%) amount of necrotic tissue within the wound bed including Adherent Slough. General Notes: callus to periwound. Wound #3 status is Open. Original cause of wound was Gradually Appeared. The wound is located on the Right,Anterior Lower Leg. The wound measures 0.8cm length x 0.7cm width x 0.1cm depth; 0.44cm^2 area and 0.044cm^3 volume. There is Fat Layer (Subcutaneous Tissue) Exposed exposed. There is no tunneling or undermining noted. There is a medium amount of serosanguineous drainage noted. The wound margin is distinct with the outline attached to the wound base. There is large (67-100%) pink granulation within the wound bed. Assessment Active Problems ICD-10 Venous insufficiency (chronic) (peripheral) Non-pressure chronic ulcer of other part of left lower leg with fat layer exposed Unspecified open wound, right lower leg, initial encounter Type 2 diabetes mellitus with other skin ulcer Non-pressure chronic ulcer of other part of right foot with fat layer exposed Essential (primary) hypertension Chronic combined systolic (congestive) and diastolic (congestive) heart failure Atherosclerotic heart disease of native coronary artery  without angina pectoris Procedures Wound #2 Pre-procedure diagnosis of Wound #2 is a Diabetic Wound/Ulcer  of the Lower Extremity located on the Right,Medial Toe Great .Severity of Tissue Pre Debridement is: Fat layer exposed. There was a Excisional Skin/Subcutaneous Tissue Debridement with a total area of 0.7 sq cm performed by Worthy Keeler, PA. With the following instrument(s): Curette to remove Viable and Non-Viable tissue/material. Material removed includes Callus, Subcutaneous Tissue, Slough, and Skin: Epidermis after achieving pain control using Lidocaine 4% Topical Solution. No specimens were taken. A time out was conducted at 16:25, prior to the start of the procedure. A Minimum amount of bleeding was controlled with Pressure. The procedure was tolerated well with a pain level of 0 throughout and a pain level of 0 following the procedure. Post Debridement Measurements: 1cm length x 0.7cm width x 0.4cm depth; 0.22cm^3 volume. Character of Wound/Ulcer Post Debridement is improved. Severity of Tissue Post Debridement is: Fat layer exposed. Post procedure Diagnosis Wound #2: Same as Pre-Procedure Plan Follow-up Appointments: Return Appointment in 1 week. Dressing Change Frequency: Wound #2 Right,Medial Toe Great: Change Dressing every other day. Wound #3 Right,Anterior Lower Leg: Do not change entire dressing for one week. Wound Cleansing: Wound #2 Right,Medial Toe Great: Clean wound with Wound Cleanser Wound #3 Right,Anterior Lower Leg: May shower with protection. - use cast protector to keep dry in the shower Primary Wound Dressing: Wound #2 Right,Medial Toe Great: Hydrofera Blue - ready Wound #3 Right,Anterior Lower Leg: Hydrofera Blue Secondary Dressing: Wound #2 Right,Medial Toe Great: Foam - donut Kerlix/Rolled Gauze Dry Gauze Edema Control: 3 Layer Compression System - Right Lower Extremity Avoid standing for long periods of time Elevate legs to the level of the  heart or above for 30 minutes daily and/or when sitting, a frequency of: - whenever sitting throughout the day Exercise regularly Support Garment 20-30 mm/Hg pressure to: - to left leg daily Off-Loading: Other: - always wear shoes when walking even in the house to protect feet Radiology ordered were: X-ray, foot complete view right - diabetic foot ulcer right great toe The following medication(s) was prescribed: doxycycline hyclate oral 100 mg capsule 1 1 capsule oral taken 2 times a day for 14 days. Do not take the PreserVision AREDS-2 while on this antibiotic starting 06/20/2019 1. I would recommend that we continue at this point with the current wound care measures which includes Hydrofera Blue to both wound locations I am hopeful this will be beneficial for her. 2. I am also going to suggest at this point that we go ahead and order an x-ray of the right great toe to evaluate for any signs of osteomyelitis hopefully that is negative in which case we will obviously continue to manage this as best we can currently. 3. Also can recommend that we have the patient avoid standing for long periods of time. I do believe that pressure and friction getting to the toe region is what is keeping this from healing anymore quickly than it already is at this point. 4. We will continue with the 3 layer compression wrap at this point on the right lower extremity. We will see patient back for reevaluation in 1 week here in the clinic. If anything worsens or changes patient will contact our office for additional recommendations. Electronic Signature(s) Signed: 06/20/2019 1:28:30 PM By: Worthy Keeler PA-C Previous Signature: 06/19/2019 4:38:03 PM Version By: Worthy Keeler PA-C Entered By: Worthy Keeler on 06/20/2019 13:28:30 -------------------------------------------------------------------------------- SuperBill Details Patient Name: Date of Service: Tonya Hunter 06/19/2019 Medical Record  [id number] Patient Account Number: [id number] Date of Birth/Sex: Treating  RN: 09/24/1934 (85 y.o. Female) [name] Primary Care Provider: [name] Other Clinician: Referring Provider: Treating Provider/Extender:Stone III, [name], [name] Weeks in Treatment: 4 Diagnosis Coding ICD-10 Codes Code Description I87.2 Venous insufficiency (chronic) (peripheral) L97.822 Non-pressure chronic ulcer of other part of left lower leg with fat layer exposed S81.801A Unspecified open wound, right lower leg, initial encounter E11.622 Type 2 diabetes mellitus with other skin ulcer L97.512 Non-pressure chronic ulcer of other part of right foot with fat layer exposed I10 Essential (primary) hypertension I50.42 Chronic combined systolic (congestive) and diastolic (congestive) heart failure I25.10 Atherosclerotic heart disease of native coronary artery without angina pectoris Facility Procedures CPT4 Code Description: [id number] - DEB SUBQ TISSUE 20 SQ CM/< ICD-10 Diagnosis Description L97.512 Non-pressure chronic ulcer of other part of right foot with Modifier: fat layer e Quantity: 1 xposed Physician Procedures CPT4 Code Description: [id number] 11042 - WC PHYS SUBQ TISS 20 SQ CM ICD-10 Diagnosis Description L97.512 Non-pressure chronic ulcer of other part of right foot with Modifier: fat layer e Quantity: 1 xposed Electronic Signature(s) Signed: 06/19/2019 4:38:19 PM By: Worthy Keeler PA-C Entered By: Worthy Keeler on 06/19/2019 16:38:18

## 2019-06-21 ENCOUNTER — Encounter: Payer: Self-pay | Admitting: Physical Therapy

## 2019-06-21 ENCOUNTER — Other Ambulatory Visit: Payer: Self-pay

## 2019-06-21 ENCOUNTER — Ambulatory Visit: Payer: Medicare Other | Attending: Diagnostic Neuroimaging | Admitting: Physical Therapy

## 2019-06-21 DIAGNOSIS — R2681 Unsteadiness on feet: Secondary | ICD-10-CM | POA: Insufficient documentation

## 2019-06-21 DIAGNOSIS — M6281 Muscle weakness (generalized): Secondary | ICD-10-CM | POA: Insufficient documentation

## 2019-06-21 DIAGNOSIS — R262 Difficulty in walking, not elsewhere classified: Secondary | ICD-10-CM | POA: Diagnosis not present

## 2019-06-21 DIAGNOSIS — R2689 Other abnormalities of gait and mobility: Secondary | ICD-10-CM | POA: Insufficient documentation
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
## 2019-06-21 NOTE — Progress Notes (Signed)
Tonya Hunter, Tonya Hunter ([id number]) Visit Report for 06/19/2019 Arrival Information Details Patient Name: Date of Service: Tonya Hunter, Tonya Hunter 06/19/2019 3:00 PM Medical Record [id number] Patient Account Number: [id number] Date of Birth/Sex: Treating RN: 07-24-1934 (85 y.o. Female) Tonya Hunter Primary Care Sailor Haughn: [name] Other Clinician: Referring Ved Martos: Treating Diezel Mazur/Extender:Stone III, [name], [name] Weeks in Treatment: 4 Visit Information History Since Last Visit Added or deleted any medications: No Patient Arrived: [name] Any new allergies or adverse reactions: No Arrival Time: 16:00 Had a fall or experienced change in No Accompanied By: self activities of daily living that may affect Transfer Assistance: None risk of falls: Patient Identification Verified: Yes Signs or symptoms of abuse/neglect since last No Secondary Verification Process Yes visito Completed: Hospitalized since last visit: No Patient Requires Transmission- No Implantable device outside of the clinic excluding No Based Precautions: cellular tissue based products placed in the center Patient Has Alerts: Yes since last visit: Patient Alerts: Patient on Blood Has Dressing in Place as Prescribed: Yes Thinner Has Compression in Place as Prescribed: Yes L ABI non Pain Present Now: No compressible Electronic Signature(s) Signed: 06/19/2019 5:54:18 PM By: Tonya Hunter Entered By: Tonya Hunter on 06/19/2019 16:11:21 -------------------------------------------------------------------------------- Encounter Discharge Information Details Patient Name: Date of Service: Tonya Hunter 06/19/2019 3:00 PM Medical Record [id number] Patient Account Number: [id number] Date of Birth/Sex: Treating RN: 04-Aug-1934 (85 y.o. Female) [name] Primary Care Allante Beane: [name] Other Clinician: Referring Barry Faircloth: Treating Dymir Neeson/Extender:Stone III, [name], Richard  A Weeks in Treatment: 4 Encounter Discharge Information Items Post Procedure Vitals Discharge Condition: Stable Temperature (F): 97.9 Ambulatory Status: Walker Pulse (bpm): 73 Discharge Destination: Home Respiratory Rate (breaths/min): 20 Transportation: Private Auto Blood Pressure (mmHg): 129/55 Accompanied By: self Schedule Follow-up Appointment: No Clinical Summary of Care: Electronic Signature(s) Signed: 06/21/2019 5:25:54 PM By: [name] RN Entered By: [name] on 06/19/2019 17:19:46 -------------------------------------------------------------------------------- Lower Extremity Assessment Details Patient Name: Date of Service: Tonya Hunter, Tonya Hunter 06/19/2019 3:00 PM Medical Record [id number] Patient Account Number: [id number] Date of Birth/Sex: Treating RN: April 15, 1935 (85 y.o. Female) Tonya Hunter Primary Care Emanuele Mcwhirter: [name] Other Clinician: Referring Nateisha Moyd: Treating Manuelito Poage/Extender:Stone III, [name], [name] Weeks in Treatment: 4 Edema Assessment Assessed: [Left: No] [Right: Yes] Edema: [Left: Yes] [Right: Yes] Calf Left: Right: Point of Measurement: 31 cm From Medial Instep cm 36 cm Ankle Left: Right: Point of Measurement: 9 cm From Medial Instep cm 23 cm Vascular Assessment Pulses: Dorsalis Pedis Palpable: [Right:Yes] Electronic Signature(s) Signed: 06/19/2019 5:54:18 PM By: Tonya Hunter Entered By: Tonya Hunter on 06/19/2019 16:12:12 -------------------------------------------------------------------------------- [place] Details Patient Name: Date of Service: Tonya Hunter 06/19/2019 3:00 PM Medical Record [id number] Patient Account Number: [id number] Date of Birth/Sex: [birth year] (84 y.o. Female) Treating RN: [name] Primary Care Daekwon Beswick: Other Clinician: [name] Referring Boluwatife Mutchler: Treating Sumayya Muha/Extender:Stone III, [name], [name] Weeks in Treatment: 4 Active  Inactive Abuse / Safety / Falls / Self Care Management Nursing Diagnoses: Potential for falls Goals: Patient/caregiver will verbalize/demonstrate measures taken to prevent injury and/or falls Date Initiated: 05/22/2019 Target Resolution Date: 07/17/2019 Goal Status: Active Interventions: Assess fall risk on admission and as needed Assess impairment of mobility on admission and as needed per policy Assess personal safety and home safety (as indicated) on admission and as needed Notes: Nutrition Nursing Diagnoses: Impaired glucose control: actual or potential Goals: Patient/caregiver will maintain therapeutic glucose control Date Initiated: 05/22/2019 Target Resolution Date: 07/17/2019 Goal Status: Active Interventions: Assess HgA1c results as ordered  upon admission and as needed Assess patient nutrition upon admission and as needed per policy Provide education on elevated blood sugars and impact on wound healing Treatment Activities: Patient referred to Primary Care Physician for further nutritional evaluation : 05/22/2019 Notes: Wound/Skin Impairment Nursing Diagnoses: Impaired tissue integrity Knowledge deficit related to ulceration/compromised skin integrity Goals: Patient/caregiver will verbalize understanding of skin care regimen Date Initiated: 05/22/2019 Target Resolution Date: 07/17/2019 Goal Status: Active Ulcer/skin breakdown will have a volume reduction of 30% by week 4 Date Initiated: 05/22/2019 Date Inactivated: 06/19/2019 Target Resolution Date: 06/19/2019 Goal Status: Met Ulcer/skin breakdown will have a volume reduction of 50% by week 8 Date Initiated: 06/19/2019 Target Resolution Date: 07/17/2019 Goal Status: Active Interventions: Assess patient/caregiver ability to obtain necessary supplies Assess patient/caregiver ability to perform ulcer/skin care regimen upon admission and as needed Assess ulceration(s) every visit Provide education on ulcer and skin care Treatment  Activities: Skin care regimen initiated : 05/22/2019 Topical wound management initiated : 05/22/2019 Notes: Electronic Signature(s) Signed: 06/19/2019 6:36:45 PM By: [name] RN, BSN Entered By: [name] on 06/19/2019 16:27:38 -------------------------------------------------------------------------------- Pain Assessment Details Patient Name: Date of Service: Tonya Hunter 06/19/2019 3:00 PM Medical Record [id number] Patient Account Number: [id number] Date of Birth/Sex: Treating RN: [birth year] (85 y.o. Female) Tonya Hunter Primary Care : [name] Other Clinician: Referring : Treating /Extender:Stone III, [name], [name] Weeks in Treatment: 4 Active Problems Location of Pain Severity and Description of Pain Patient Has Paino No Site Locations Rate the pain. Current Pain Level: 0 Pain Management and Medication Current Pain Management: Medication: No Cold Application: No Rest: No Massage: No Activity: No T.E.N.S.: No Heat Application: No Leg drop or elevation: No Is the Current Pain Management Adequate: Adequate How does your wound impact your activities of daily livingo Sleep: No Bathing: No Appetite: No Relationship With Others: No Bladder Continence: No Emotions: No Bowel Continence: No Work: No Toileting: No Drive: No Dressing: No Hobbies: No Electronic Signature(s) Signed: 06/19/2019 5:54:18 PM By: Tonya Hunter Entered By: Tonya Hunter on 06/19/2019 16:11:57 -------------------------------------------------------------------------------- Patient/Caregiver Education Details Patient Name: Tonya Hunter, Tonya S. 2/3/2021andnbsp3:00 Date of Service: PM Medical Record [id number] Number: Patient Account Number: [id number] Treating RN: Date of Birth/Gender: 03/02/1935 (84 y.o. [name] Female) Other Clinician: Primary Care Treating Salome Spotted Physician: Physician/Extender: Referring Physician: Denyse Dago in Treatment: 4 Education Assessment Education Provided To: Patient Education Topics Provided Elevated Blood Sugar/ Impact on Healing: Methods: Explain/Verbal Responses: Reinforcements needed, State content correctly Offloading: Methods: Explain/Verbal Responses: Reinforcements needed, State content correctly Wound/Skin Impairment: Methods: Explain/Verbal Responses: Reinforcements needed, State content correctly Electronic Signature(s) Signed: 06/19/2019 6:36:45 PM By: [name] RN, BSN Entered By: [name] on 06/19/2019 16:28:10 -------------------------------------------------------------------------------- Wound Assessment Details Patient Name: Date of Service: Tonya Hunter 06/19/2019 3:00 PM Medical Record [id number] Patient Account Number: [id number] Date of Birth/Sex: Treating RN: Nov 10, 1934 (84 y.o. Female) Primary Care : [name] Other Clinician: Referring : Treating /Extender:Stone III, [name], [name] Weeks in Treatment: 4 Wound Status Wound Number: 2 Primary Diabetic Wound/Ulcer of the Lower Extremity Etiology: Wound Location: Right Toe Great - Medial Wound Open Wounding Event: Blister Status: Date Acquired: 05/22/2019 Comorbid Cataracts, Anemia, Angina, Congestive Heart Weeks Of Treatment: 4 History: Failure, Coronary Artery Disease, Clustered Wound: No Hypertension, Myocardial Infarction, Type II Diabetes, Osteoarthritis, Neuropathy Photos Wound Measurements Length: (cm) 1 % Reduction in Are Width: (cm) 0.7 % Reduction in Vol Depth: (cm) 0.4 Epithelialization: Area: (cm) 0.55  Tunneling: Volume: (cm) 0.22 Undermining: Wound Description Classification: Grade 2 Foul Odor After C Wound Margin: Flat and Intact Slough/Fibrino Exudate Amount: Medium Exudate Type: Serosanguineous Exudate Color: red,  brown Wound Bed Granulation Amount: Medium (34-66%) Granulation Quality: Pink Fascia Exposed: Necrotic Amount: Medium (34-66%) Fat Layer (Subcut Necrotic Quality: Adherent Slough Tendon Exposed: Muscle Exposed: Joint Exposed: Bone Exposed: Assessment Notes callus to periwound. Treatment Notes Wound #2 (Right, Medial Toe Great) 1. Cleanse With Wound Cleanser Soap and water 3. Primary Dressing Applied Hydrofera Blue 4. Secondary Dressing Dry Gauze 5. Secured With Tape leansing: No Yes Exposed Structure No aneous Tissue) Exposed: Yes No No No No a: 92.4% ume: 69.6% Medium (34-66%) No No Notes netting Electronic Signature(s) Signed: 06/20/2019 4:25:19 PM By: Tonya Hunter EMT/HBOT Previous Signature: 06/19/2019 5:54:18 PM Version By: Tonya Hunter Entered By: Tonya Hunter on 06/20/2019 16:09:12 -------------------------------------------------------------------------------- Wound Assessment Details Patient Name: Date of Service: Tonya Hunter 06/19/2019 3:00 PM Medical Record [id number] Patient Account Number: [id number] Date of Birth/Sex: Treating RN: [birth year] (85 y.o. Female) Primary Care Selvin Yun: [name] Other Clinician: Referring Hashem Goynes: Treating Kein Carlberg/Extender:Stone III, [name], [name] Weeks in Treatment: 4 Wound Status Wound Number: 3 Primary Skin Tear Etiology: Wound Location: Right Lower Leg - Anterior Wound Open Wounding Event: Gradually Appeared Status: Date Acquired: 05/29/2019 Comorbid Cataracts, Anemia, Angina, Congestive Heart Weeks Of Treatment: 2 History: Failure, Coronary Artery Disease, Clustered Wound: No Hypertension, Myocardial Infarction, Type II Diabetes, Osteoarthritis, Neuropathy Photos Wound Measurements Length: (cm) 0.8 % Reduct Width: (cm) 0.7 % Reduct Depth: (cm) 0.1 Epitheli Area: (cm) 0.44 Tunneli Volume: (cm) 0.044 Undermi Wound Description Full Thickness Without Exposed  Support Foul Od Classification: Structures Slough/ Wound Distinct, outline attached Margin: Exudate Medium Amount: Exudate Serosanguineous Type: Exudate red, brown Color: Wound Bed Granulation Amount: Large (67-100%) Granulation Quality: Pink Fascia E Fat Laye Tendon E Muscle E Joint Ex Bone Exp or After Cleansing: No Fibrino No Exposed Structure xposed: No r (Subcutaneous Tissue) Exposed: Yes xposed: No xposed: No posed: No osed: No ion in Area: 71.3% ion in Volume: 71.2% alization: Large (67-100%) ng: No ning: No Treatment Notes Wound #3 (Right, Anterior Lower Leg) 1. Cleanse With Wound Cleanser 3. Primary Dressing Applied Hydrofera Blue 4. Secondary Dressing Dry Gauze 6. Support Layer Applied 3 layer compression wrap Notes netting Electronic Signature(s) Signed: 06/20/2019 4:25:19 PM By: Tonya Hunter EMT/HBOT Previous Signature: 06/19/2019 5:54:18 PM Version By: Tonya Hunter Entered By: Tonya Hunter on 06/20/2019 16:09:41 -------------------------------------------------------------------------------- Vitals Details Patient Name: Date of Service: Tonya Hunter. 06/19/2019 3:00 PM Medical Record [id number] Patient Account Number: [id number] Date of Birth/Sex: Treating RN: May 05, 1935 (85 y.o. Female) Tonya Hunter Primary Care Americus Scheurich: [name] Other Clinician: Referring Blaire Palomino: Treating Decoda Van/Extender:Stone III, [name], [name] Weeks in Treatment: 4 Vital Signs Time Taken: 16:03 Temperature (F): 97.9 Height (in): 66 Pulse (bpm): 73 Weight (lbs): 164 Respiratory Rate (breaths/min): 20 Body Mass Index (BMI): 26.5 Blood Pressure (mmHg): 129/55 Capillary Blood Glucose (mg/dl): 141 Reference Range: 80 - 120 mg / dl Electronic Signature(s) Signed: 06/19/2019 5:54:18 PM By: Tonya Hunter Entered By: Tonya Hunter on 06/19/2019 16:11:40

## 2019-06-24 ENCOUNTER — Other Ambulatory Visit: Payer: Self-pay

## 2019-06-24 ENCOUNTER — Other Ambulatory Visit: Payer: Self-pay | Admitting: Internal Medicine

## 2019-06-24 ENCOUNTER — Ambulatory Visit
Admission: RE | Admit: 2019-06-24 | Discharge: 2019-06-24 | Disposition: A | Discharge status: Home / Self Care | Payer: Medicare Other | Source: Ambulatory Visit | Attending: Internal Medicine | Admitting: Internal Medicine

## 2019-06-24 DIAGNOSIS — R112 Nausea with vomiting, unspecified: Secondary | ICD-10-CM | POA: Diagnosis not present

## 2019-06-24 DIAGNOSIS — R1031 Right lower quadrant pain: Secondary | ICD-10-CM | POA: Diagnosis not present

## 2019-06-24 DIAGNOSIS — K5730 Diverticulosis of large intestine without perforation or abscess without bleeding: Secondary | ICD-10-CM | POA: Diagnosis not present

## 2019-06-24 DIAGNOSIS — I129 Hypertensive chronic kidney disease with stage 1 through stage 4 chronic kidney disease, or unspecified chronic kidney disease: Secondary | ICD-10-CM | POA: Diagnosis not present

## 2019-06-24 DIAGNOSIS — K7460 Unspecified cirrhosis of liver: Secondary | ICD-10-CM | POA: Diagnosis not present

## 2019-06-24 DIAGNOSIS — N182 Chronic kidney disease, stage 2 (mild): Secondary | ICD-10-CM | POA: Diagnosis not present

## 2019-06-24 MED ORDER — IOPAMIDOL (ISOVUE-300) INJECTION 61%
100.0000 mL | Freq: Once | INTRAVENOUS | Status: AC | PRN
  Administered 2019-06-24: 13:00:00 100 mL via INTRAVENOUS

## 2019-06-25 ENCOUNTER — Ambulatory Visit: Payer: Medicare Other | Attending: Internal Medicine

## 2019-06-25 DIAGNOSIS — Z23 Encounter for immunization: Secondary | ICD-10-CM | POA: Insufficient documentation

## 2019-06-25 NOTE — Progress Notes (Signed)
   Covid-19 Vaccination Clinic  Name:  Tonya Hunter    MRN: [id number] DOB: 1934-07-11  06/25/2019  [name] was observed post Covid-19 immunization for 15 minutes without incidence. She was provided with Vaccine Information Sheet and instruction to access the V-Safe system.   [name] was instructed to call 911 with any severe reactions post vaccine: Marland Kitchen Difficulty breathing  . Swelling of your face and throat  . A fast heartbeat  . A bad rash all over your body  . Dizziness and weakness    Immunizations Administered    Name Date Dose VIS Date Route   Pfizer COVID-19 Vaccine 06/25/2019 10:58 AM 0.3 mL 04/26/2019 Intramuscular   Manufacturer: [place]   Lot: [id number]   [place]: [id number]

## 2019-06-26 ENCOUNTER — Ambulatory Visit (HOSPITAL_BASED_OUTPATIENT_CLINIC_OR_DEPARTMENT_OTHER): Payer: Medicare Other | Admitting: Physician Assistant

## 2019-06-26 ENCOUNTER — Encounter: Payer: Medicare Other | Attending: Internal Medicine | Admitting: Registered"

## 2019-06-26 ENCOUNTER — Other Ambulatory Visit: Payer: Self-pay

## 2019-06-26 ENCOUNTER — Encounter (HOSPITAL_BASED_OUTPATIENT_CLINIC_OR_DEPARTMENT_OTHER): Payer: Medicare Other | Attending: Physician Assistant | Admitting: Physician Assistant

## 2019-06-26 ENCOUNTER — Encounter: Payer: Self-pay | Admitting: Registered"

## 2019-06-26 DIAGNOSIS — I129 Hypertensive chronic kidney disease with stage 1 through stage 4 chronic kidney disease, or unspecified chronic kidney disease: Secondary | ICD-10-CM | POA: Diagnosis not present

## 2019-06-26 DIAGNOSIS — I110 Hypertensive heart disease with heart failure: Secondary | ICD-10-CM | POA: Diagnosis not present

## 2019-06-26 DIAGNOSIS — I5042 Chronic combined systolic (congestive) and diastolic (congestive) heart failure: Secondary | ICD-10-CM | POA: Diagnosis not present

## 2019-06-26 DIAGNOSIS — I872 Venous insufficiency (chronic) (peripheral): Secondary | ICD-10-CM | POA: Diagnosis not present

## 2019-06-26 DIAGNOSIS — E11622 Type 2 diabetes mellitus with other skin ulcer: Secondary | ICD-10-CM | POA: Diagnosis not present

## 2019-06-26 DIAGNOSIS — E11621 Type 2 diabetes mellitus with foot ulcer: Secondary | ICD-10-CM | POA: Diagnosis not present

## 2019-06-26 DIAGNOSIS — L97812 Non-pressure chronic ulcer of other part of right lower leg with fat layer exposed: Secondary | ICD-10-CM | POA: Diagnosis not present

## 2019-06-26 DIAGNOSIS — E538 Deficiency of other specified B group vitamins: Secondary | ICD-10-CM | POA: Diagnosis not present

## 2019-06-26 DIAGNOSIS — L97512 Non-pressure chronic ulcer of other part of right foot with fat layer exposed: Secondary | ICD-10-CM | POA: Diagnosis not present

## 2019-06-26 DIAGNOSIS — E559 Vitamin D deficiency, unspecified: Secondary | ICD-10-CM | POA: Diagnosis not present

## 2019-06-26 DIAGNOSIS — E119 Type 2 diabetes mellitus without complications: Secondary | ICD-10-CM | POA: Diagnosis not present

## 2019-06-26 DIAGNOSIS — E038 Other specified hypothyroidism: Secondary | ICD-10-CM | POA: Diagnosis not present

## 2019-06-26 DIAGNOSIS — E118 Type 2 diabetes mellitus with unspecified complications: Secondary | ICD-10-CM

## 2019-06-26 DIAGNOSIS — E782 Mixed hyperlipidemia: Secondary | ICD-10-CM | POA: Diagnosis not present

## 2019-06-26 DIAGNOSIS — R82998 Other abnormal findings in urine: Secondary | ICD-10-CM | POA: Diagnosis not present

## 2019-06-26 NOTE — Progress Notes (Signed)
Diabetes Self-Management Education  Visit Type: First/Initial  Appt. Start Time: 1535 Appt. End Time: 1700  06/27/2019  [name], identified by name and date of birth, is a 85 y.o. female with a diagnosis of Diabetes: Type 2.   ASSESSMENT  There were no vitals taken for this visit. There is no height or weight on file to calculate BMI.   Patient ambulates with a walker. Pt states she was told she probably doesn't need it and that she can use a wall if she fells she is starting to lose balance. Patient states the walker helps her feel safe. Pt states she had a drop foot that made her trip and fall once but states after surgery by Dr. Ronnald Ramp performed on scar tissue and bone around her knee she does not have issue with drop foot any more.  Patient states since she had a stroke 7 months ago she has been living with her daughter who has been providing her meals and restricting her carbohydrate intake. Pt reports PPBG ~2 hrs after evening meal has been 120s -160s with occasional reading near 200. Pt states she does not know what causes the high readings because she does not eat much carbohydrates. Pt states with her dietary changes her blood sugar control has improved and she was able to stop using insulin.  Patient states she would like to be able to eat a few more carbohydrates including crackers and potatoes and states she enjoys peanut butter but has not been eat much of it recently.  Pt wanted confirmation that it was okay if she ate a few more carbs and that she won't gain weight. RD cannot promise anything around weight, but reinforced several times that the goal with counting carbs is to control blood sugar and related problems. Patient states she was lost 40 lbs in 7 months (5%) but weight loss has reached a plateau. Per the American Diabetes Association manual, after the age of 85 yrs old weight loss usually is not recommended.  Diabetes Self-Management Education - 06/26/19 1555      Visit Information   Visit Type  First/Initial      Initial Visit   Diabetes Type  Type 2    Are you taking your medications as prescribed?  Yes   low carb   Date Diagnosed  51 yrs ago      Health Coping   How would you rate your overall health?  Fair      Psychosocial Assessment   Patient Belief/Attitude about Diabetes  Motivated to manage diabetes    How often do you need to have someone help you when you read instructions, pamphlets, or other written materials from your doctor or pharmacy?  1 - Never    What is the last grade level you completed in school?  1 yr college      Complications   Last HgB A1C per patient/outside source  6.6 %    How often do you check your blood sugar?  1-2 times/day    Postprandial Blood glucose range (mg/dL)  70-129;130-179   occasionally near 200 PPBG 2 hrs dinner   Have you had a dilated eye exam in the past 12 months?  Yes    Have you had a dental exam in the past 12 months?  Yes      Dietary Intake   Breakfast  Protein drink, 1/2 banana, coffee    Snack (morning)  atkins snack bar    Lunch  broccoli  soup, pimento cheese, 2 crackers    Snack (afternoon)  4 crackers, cheese, salsa    Dinner  vegetable, meat    Snack (evening)  none    Beverage(s)  water, coffee, tea with splenda      Exercise   Exercise Type  Light (walking / raking leaves)    How many days per week to you exercise?  3    How many minutes per day do you exercise?  40    Total minutes per week of exercise  120      Patient Education   Previous Diabetes Education  No    Nutrition management   Role of diet in the treatment of diabetes and the relationship between the three main macronutrients and blood glucose level;Food label reading, portion sizes and measuring food.;Carbohydrate counting      Individualized Goals (developed by patient)   Nutrition  General guidelines for healthy choices and portions discussed      Outcomes   Expected Outcomes  Demonstrated interest  in learning. Expect positive outcomes    Future DMSE  PRN    Program Status  Completed       Individualized Plan for Diabetes Self-Management Training:   Learning Objective:  Patient will have a greater understanding of diabetes self-management. Patient education plan is to attend individual and/or group sessions per assessed needs and concerns.  Patient Instructions  15 grams of carbohydrate, no matter the source, will affect your blood sugar. 15 grams is not that much, and would be appropriate to eat that much for a snack. You can use the Snack Sheet handout for ideas of foods that would equal 15 grams.   When eating carbohydrates, include protein. (can use handout for protein ideas).   For dinner probably 30 grams should be fine as part of a balanced meal.  [employer] bowl of chicken noodle soup is about 20 grams of carbohydrates.  Other things can also affect your blood sugar such as stress.     Expected Outcomes:  Demonstrated interest in learning. Expect positive outcomes  Education material provided: My Plate, Snack sheet and Carbohydrate counting sheet  If problems or questions, patient to contact team via:  Phone  Future DSME appointment: PRN

## 2019-06-26 NOTE — Patient Instructions (Signed)
15 grams of carbohydrate, no matter the source, will affect your blood sugar. 15 grams is not that much, and would be appropriate to eat that much for a snack. You can use the Snack Sheet handout for ideas of foods that would equal 15 grams.   When eating carbohydrates, include protein. (can use handout for protein ideas).   For dinner probably 30 grams should be fine as part of a balanced meal.  [employer] bowl of chicken noodle soup is about 20 grams of carbohydrates.  Other things can also affect your blood sugar such as stress.

## 2019-06-26 NOTE — Progress Notes (Addendum)
Tonya, Hunter ([id number]) Visit Report for 06/26/2019 Chief Complaint Document Details Patient Name: Date of Service: Tonya Hunter, Tonya Hunter 06/26/2019 11:15 AM Medical Record [id number] Patient Account Number: [id number] Date of Birth/Sex: Treating RN: 11/09/1934 (85 y.o. [name] Primary Care Provider: [name] Other Clinician: Referring Provider: Treating Provider/Extender:Stone III, [name], [name] Weeks in Treatment: 5 Information Obtained from: Patient Chief Complaint Right LE skin tear and right 1st toe ulcer Electronic Signature(s) Signed: 06/26/2019 11:55:55 AM By: Worthy Keeler PA-C Entered By: Worthy Keeler on 06/26/2019 11:55:54 -------------------------------------------------------------------------------- Debridement Details Patient Name: Date of Service: Tonya Hunter 06/26/2019 11:15 AM Medical Record [id number] Patient Account Number: [id number] Date of Birth/Sex: Treating RN: [birth year] (84 y.o. [name] Primary Care Provider: [name] Other Clinician: Referring Provider: Treating Provider/Extender:Stone III, [name], [name] Weeks in Treatment: 5 Debridement Performed for Wound #2 Right,Medial Toe Great Assessment: Performed By: Physician Worthy Keeler, PA Debridement Type: Debridement Severity of Tissue Pre Fat layer exposed Debridement: Level of Consciousness (Pre- Awake and Alert procedure): Pre-procedure Verification/Time Out Taken: Yes - 12:13 Start Time: 12:13 Pain Control: Other : benzocaine 20% spray Total Area Debrided (L x W): 1.5 (cm) x 1.5 (cm) = 2.25 (cm) Tissue and other material Viable, Non-Viable, Callus, Slough, Subcutaneous, Skin: Epidermis, Slough debrided: Level: Skin/Subcutaneous Tissue Debridement Description: Excisional Instrument: Curette Bleeding: Minimum Hemostasis Achieved: Pressure End Time: 12:17 Procedural Pain: 0 Post Procedural Pain:  0 Response to Treatment: Procedure was tolerated well Level of Consciousness Awake and Alert (Post-procedure): Post Debridement Measurements of Total Wound Length: (cm) 0.8 Width: (cm) 0.7 Depth: (cm) 0.3 Volume: (cm) 0.132 Character of Wound/Ulcer Post Improved Debridement: Severity of Tissue Post Debridement: Fat layer exposed Post Procedure Diagnosis Same as Pre-procedure Electronic Signature(s) Signed: 06/26/2019 5:39:41 PM By: [name] RN, BSN Signed: 06/26/2019 6:32:46 PM By: Worthy Keeler PA-C Entered By: [name] on 06/26/2019 12:18:12 -------------------------------------------------------------------------------- HPI Details Patient Name: Date of Service: Tonya Hunter 06/26/2019 11:15 AM Medical Record [id number] Patient Account Number: [id number] Date of Birth/Sex: Treating RN: 24-Aug-1934 (85 y.o. [name] Primary Care Provider: [name] Other Clinician: Referring Provider: Treating Provider/Extender:Stone III, [name], [name] Weeks in Treatment: 5 History of Present Illness HPI Description: 05/22/2019 patient is seen today in regard to her bilateral lower extremities this was a referral from her primary care provider they were concerned about areas of bruising and contusion that happened on a regular basis. With that being said the patient really has an issue with her right great toe where she has a significant blister that wraps around to the underside of the toe that has me more concerned to be perfectly honest than the legs themselves do they seem to be doing quite well. I explained in regard to the legs she is good to have ongoing and continued issues with blisters based on the fragility of her skin as long as she is on the Plavix and aspirin this is likely to be an ongoing issue. Honestly what she is going need to do is protect that area. In regard to the toe however that started 2 days ago and she tells me it  seems to be getting worse each day. No fevers, chills, nausea, vomiting, or diarrhea. 05/29/2019 on evaluation today patient appears to be doing much better with regard to her leg which in fact appears to be completely healed. That is on the left. In regard to her right great toe this  is showing signs of improvement I am very pleased with how things seem to be progressing after we had removed the blistered skin down to an open wound last week. Overall I feel like there is new tissue growth and I am very excited that this in 1 week is doing so well. 06/05/2019 on evaluation today patient appears to be doing actually fairly well in regard to her toe ulcer. Unfortunately she does have a skin tear on the anterior surface of her right lower extremity which unfortunately is a little bit rolled under on the edge of the skin tear. Nonetheless I think we may be able to straighten this out and if so that would go a long way to help in this area to heal more effectively and quickly. Fortunately there is no signs of active infection at this time. She does have some swelling but again I think this is secondary to the skin tear as opposed to anything else. 06/12/2019 on evaluation today patient appears to be doing about the same in regard to the toe. With that being said she is having issues with the Steri-Strips having come off on her leg and there was some skin that actually necrosis and did slough off at this point. Fortunately there is no evidence of active infection at this time. She is very concerned about the possibility of amputation she states her daughter is also very concerned. With that being said I have not seen her daughter up to this point. Also have not really realized until today that the patient may have a component of dementia that may be preventing complete compliance and remembering of what needs to be done as far as taking care of her wound. 06/19/2019 on evaluation today patient appears to be  doing well with regard to her lower extremity ulcer the toe ulcer is measuring better size wise but does have a little bit more depth. Again I do think we may want proceed with an x- ray at this point in order to further evaluate this and see if there is anything we need to do to aid in getting this to heal more appropriately and quickly. Fortunately there is no signs of systemic infection at this time. 06/26/2019 upon evaluation today patient actually appears to be doing quite well with regard to her lower extremity ulcer and her toe ulcer. I did prescribe her doxycycline in the interim since I last saw her last week and this week. She contacted the office on Friday and wanted to see about getting something at that point based on the results of the x-ray finding. There did not appear to be any signs of osteomyelitis but there was some evidence potentially of infection. Nonetheless I did send this in for her. Electronic Signature(s) Signed: 06/26/2019 2:16:37 PM By: Worthy Keeler PA-C Entered By: Worthy Keeler on 06/26/2019 14:16:37 -------------------------------------------------------------------------------- Physical Exam Details Patient Name: Date of Service: Tonya Hunter, Tonya Hunter 06/26/2019 11:15 AM Medical Record [id number] Patient Account Number: [id number] Date of Birth/Sex: Treating RN: [birth year] (85 y.o. [name] Primary Care Provider: [name] Other Clinician: Referring Provider: Treating Provider/Extender:Stone III, [name], [name] Weeks in Treatment: 5 Constitutional Well-nourished and well-hydrated in no acute distress. Respiratory normal breathing without difficulty. Psychiatric this patient is able to make decisions and demonstrates good insight into disease process. Alert and Oriented x 3. pleasant and cooperative. Notes With regard to the patient's wound bed overall this seems to be doing quite well and I am  very pleased with  the progress both in regard to the leg as well as the great toe. Overall there is no signs of active infection and I feel like she is making excellent progress I did actually perform sharp debridement lightly on the right great toe which she tolerated without any complication minimal bleeding was noted and then post debridement I did use 1 stick of silver nitrate and a very small area to chemically cauterize this region. Electronic Signature(s) Signed: 06/26/2019 2:17:13 PM By: Worthy Keeler PA-C Entered By: Worthy Keeler on 06/26/2019 14:17:13 -------------------------------------------------------------------------------- Physician Orders Details Patient Name: Date of Service: Tonya Hunter, Tonya Hunter 06/26/2019 11:15 AM Medical Record [id number] Patient Account Number: [id number] Date of Birth/Sex: Treating RN: [birth year] (84 y.o. [name] Primary Care Provider: [name] Other Clinician: Referring Provider: Treating Provider/Extender:Stone III, [name], [name] Weeks in Treatment: 5 Verbal / Phone Orders: No Diagnosis Coding ICD-10 Coding Code Description I87.2 Venous insufficiency (chronic) (peripheral) L97.822 Non-pressure chronic ulcer of other part of left lower leg with fat layer exposed S81.801A Unspecified open wound, right lower leg, initial encounter E11.622 Type 2 diabetes mellitus with other skin ulcer L97.512 Non-pressure chronic ulcer of other part of right foot with fat layer exposed I10 Essential (primary) hypertension I50.42 Chronic combined systolic (congestive) and diastolic (congestive) heart failure I25.10 Atherosclerotic heart disease of native coronary artery without angina pectoris Follow-up Appointments Return Appointment in 1 week. Dressing Change Frequency Wound #2 Right,Medial Toe Great Change Dressing every other day. Wound #3 Right,Anterior Lower Leg Do not change entire dressing for one week. Skin Barriers/Peri-Wound  Care Moisturizing lotion - to right leg Wound Cleansing Wound #2 Right,Medial Toe Great Clean wound with Wound Cleanser Wound #3 Right,Anterior Lower Leg May shower with protection. - use cast protector to keep dry in the shower Primary Wound Dressing Wound #2 Right,Medial Toe Great Iodoflex Wound #3 Right,Anterior Lower Leg Mepitel or Adaptic Secondary Dressing Wound #2 Right,Medial Toe Great Foam - donut Kerlix/Rolled Gauze Dry Gauze Wound #3 Right,Anterior Lower Leg Dry Gauze Edema Control 3 Layer Compression System - Right Lower Extremity Avoid standing for long periods of time Elevate legs to the level of the heart or above for 30 minutes daily and/or when sitting, a frequency of: - whenever sitting throughout the day Exercise regularly Support Garment 20-30 mm/Hg pressure to: - to left leg daily Off-Loading Other: - always wear shoes when walking even in the house to protect feet Electronic Signature(s) Signed: 06/26/2019 5:39:41 PM By: [name] RN, BSN Signed: 06/26/2019 6:32:46 PM By: Worthy Keeler PA-C Entered By: [name] on 06/26/2019 12:20:04 -------------------------------------------------------------------------------- Problem List Details Patient Name: Date of Service: Tonya Hunter 06/26/2019 11:15 AM Medical Record [id number] Patient Account Number: [id number] Date of Birth/Sex: Treating RN: 10-Sep-1934 (85 y.o. [name] Primary Care Provider: [name] Other Clinician: Referring Provider: Treating Provider/Extender:Stone III, [name], Richard Janett Billow in Treatment: 5 Active Problems ICD-10 Evaluated Encounter Code Description Active Date Today Diagnosis I87.2 Venous insufficiency (chronic) (peripheral) 05/22/2019 No Yes L97.822 Non-pressure chronic ulcer of other part of left lower 05/22/2019 No Yes leg with fat layer exposed S81.801A Unspecified open wound, right lower leg, initial 06/05/2019 No  Yes encounter E11.622 Type 2 diabetes mellitus with other skin ulcer 05/22/2019 No Yes L97.512 Non-pressure chronic ulcer of other part of right foot 05/22/2019 No Yes with fat layer exposed I10 Essential (primary) hypertension 05/22/2019 No Yes I50.42 Chronic combined systolic (congestive) and diastolic 11/13/6965 No Yes (  congestive) heart failure I25.10 Atherosclerotic heart disease of native coronary artery 05/22/2019 No Yes without angina pectoris Inactive Problems Resolved Problems Electronic Signature(s) Signed: 06/26/2019 11:55:49 AM By: Worthy Keeler PA-C Entered By: Worthy Keeler on 06/26/2019 11:55:49 -------------------------------------------------------------------------------- Progress Note Details Patient Name: Date of Service: Tonya Hunter 06/26/2019 11:15 AM Medical Record [id number] Patient Account Number: [id number] Date of Birth/Sex: Treating RN: 1935-04-01 (85 y.o. [name] Primary Care Provider: [name] Other Clinician: Referring Provider: Treating Provider/Extender:Stone III, [name], [name] Weeks in Treatment: 5 Subjective Chief Complaint Information obtained from Patient Right LE skin tear and right 1st toe ulcer History of Present Illness (HPI) 05/22/2019 patient is seen today in regard to her bilateral lower extremities this was a referral from her primary care provider they were concerned about areas of bruising and contusion that happened on a regular basis. With that being said the patient really has an issue with her right great toe where she has a significant blister that wraps around to the underside of the toe that has me more concerned to be perfectly honest than the legs themselves do they seem to be doing quite well. I explained in regard to the legs she is good to have ongoing and continued issues with blisters based on the fragility of her skin as long as she is on the Plavix and aspirin this is likely to be  an ongoing issue. Honestly what she is going need to do is protect that area. In regard to the toe however that started 2 days ago and she tells me it seems to be getting worse each day. No fevers, chills, nausea, vomiting, or diarrhea. 05/29/2019 on evaluation today patient appears to be doing much better with regard to her leg which in fact appears to be completely healed. That is on the left. In regard to her right great toe this is showing signs of improvement I am very pleased with how things seem to be progressing after we had removed the blistered skin down to an open wound last week. Overall I feel like there is new tissue growth and I am very excited that this in 1 week is doing so well. 06/05/2019 on evaluation today patient appears to be doing actually fairly well in regard to her toe ulcer. Unfortunately she does have a skin tear on the anterior surface of her right lower extremity which unfortunately is a little bit rolled under on the edge of the skin tear. Nonetheless I think we may be able to straighten this out and if so that would go a long way to help in this area to heal more effectively and quickly. Fortunately there is no signs of active infection at this time. She does have some swelling but again I think this is secondary to the skin tear as opposed to anything else. 06/12/2019 on evaluation today patient appears to be doing about the same in regard to the toe. With that being said she is having issues with the Steri-Strips having come off on her leg and there was some skin that actually necrosis and did slough off at this point. Fortunately there is no evidence of active infection at this time. She is very concerned about the possibility of amputation she states her daughter is also very concerned. With that being said I have not seen her daughter up to this point. Also have not really realized until today that the patient may have a component of dementia that may be  preventing complete compliance and remembering of what needs to be done as far as taking care of her wound. 06/19/2019 on evaluation today patient appears to be doing well with regard to her lower extremity ulcer the toe ulcer is measuring better size wise but does have a little bit more depth. Again I do think we may want proceed with an x- ray at this point in order to further evaluate this and see if there is anything we need to do to aid in getting this to heal more appropriately and quickly. Fortunately there is no signs of systemic infection at this time. 06/26/2019 upon evaluation today patient actually appears to be doing quite well with regard to her lower extremity ulcer and her toe ulcer. I did prescribe her doxycycline in the interim since I last saw her last week and this week. She contacted the office on Friday and wanted to see about getting something at that point based on the results of the x-ray finding. There did not appear to be any signs of osteomyelitis but there was some evidence potentially of infection. Nonetheless I did send this in for her. Objective Constitutional Well-nourished and well-hydrated in no acute distress. Vitals Time Taken: 11:14 AM, Height: 66 in, Weight: 164 lbs, BMI: 26.5, Temperature: 97.9 F, Pulse: 72 bpm, Respiratory Rate: 20 breaths/min, Blood Pressure: 126/55 mmHg, Capillary Blood Glucose: 121 mg/dl. Respiratory normal breathing without difficulty. Psychiatric this patient is able to make decisions and demonstrates good insight into disease process. Alert and Oriented x 3. pleasant and cooperative. General Notes: With regard to the patient's wound bed overall this seems to be doing quite well and I am very pleased with the progress both in regard to the leg as well as the great toe. Overall there is no signs of active infection and I feel like she is making excellent progress I did actually perform sharp debridement lightly on the right great  toe which she tolerated without any complication minimal bleeding was noted and then post debridement I did use 1 stick of silver nitrate and a very small area to chemically cauterize this region. Integumentary (Hair, Skin) Wound #2 status is Open. Original cause of wound was Blister. The wound is located on the [employer]. The wound measures 0.8cm length x 0.7cm width x 0.3cm depth; 0.44cm^2 area and 0.132cm^3 volume. There is Fat Layer (Subcutaneous Tissue) Exposed exposed. There is no tunneling or undermining noted. There is a medium amount of serosanguineous drainage noted. The wound margin is flat and intact. There is medium (34-66%) pink granulation within the wound bed. There is a medium (34-66%) amount of necrotic tissue within the wound bed including Adherent Slough. Wound #3 status is Open. Original cause of wound was Gradually Appeared. The wound is located on the Right,Anterior Lower Leg. The wound measures 0.3cm length x 0.3cm width x 0.1cm depth; 0.071cm^2 area and 0.007cm^3 volume. There is Fat Layer (Subcutaneous Tissue) Exposed exposed. There is no tunneling or undermining noted. There is a medium amount of serosanguineous drainage noted. The wound margin is distinct with the outline attached to the wound base. There is large (67-100%) pink granulation within the wound bed. Assessment Active Problems ICD-10 Venous insufficiency (chronic) (peripheral) Non-pressure chronic ulcer of other part of left lower leg with fat layer exposed Unspecified open wound, right lower leg, initial encounter Type 2 diabetes mellitus with other skin ulcer Non-pressure chronic ulcer of other part of right foot with fat layer exposed Essential (primary) hypertension Chronic combined  systolic (congestive) and diastolic (congestive) heart failure Atherosclerotic heart disease of native coronary artery without angina pectoris Procedures Wound #2 Pre-procedure diagnosis of Wound #2 is a  Diabetic Wound/Ulcer of the Lower Extremity located on the Right,Medial Toe Great .Severity of Tissue Pre Debridement is: Fat layer exposed. There was a Excisional Skin/Subcutaneous Tissue Debridement with a total area of 2.25 sq cm performed by Worthy Keeler, PA. With the following instrument(s): Curette to remove Viable and Non-Viable tissue/material. Material removed includes Callus, Subcutaneous Tissue, Slough, and Skin: Epidermis after achieving pain control using Other (benzocaine 20% spray). No specimens were taken. A time out was conducted at 12:13, prior to the start of the procedure. A Minimum amount of bleeding was controlled with Pressure. The procedure was tolerated well with a pain level of 0 throughout and a pain level of 0 following the procedure. Post Debridement Measurements: 0.8cm length x 0.7cm width x 0.3cm depth; 0.132cm^3 volume. Character of Wound/Ulcer Post Debridement is improved. Severity of Tissue Post Debridement is: Fat layer exposed. Post procedure Diagnosis Wound #2: Same as Pre-Procedure Wound #3 Pre-procedure diagnosis of Wound #3 is a Skin Tear located on the Right,Anterior Lower Leg . There was a Three Layer Compression Therapy Procedure by [name], RN. Post procedure Diagnosis Wound #3: Same as Pre-Procedure Plan Follow-up Appointments: Return Appointment in 1 week. Dressing Change Frequency: Wound #2 Right,Medial Toe Great: Change Dressing every other day. Wound #3 Right,Anterior Lower Leg: Do not change entire dressing for one week. Skin Barriers/Peri-Wound Care: Moisturizing lotion - to right leg Wound Cleansing: Wound #2 Right,Medial Toe Great: Clean wound with Wound Cleanser Wound #3 Right,Anterior Lower Leg: May shower with protection. - use cast protector to keep dry in the shower Primary Wound Dressing: Wound #2 Right,Medial Toe Great: Iodoflex Wound #3 Right,Anterior Lower Leg: Mepitel or Adaptic Secondary Dressing: Wound #2  Right,Medial Toe Great: Foam - donut Kerlix/Rolled Gauze Dry Gauze Wound #3 Right,Anterior Lower Leg: Dry Gauze Edema Control: 3 Layer Compression System - Right Lower Extremity Avoid standing for long periods of time Elevate legs to the level of the heart or above for 30 minutes daily and/or when sitting, a frequency of: - whenever sitting throughout the day Exercise regularly Support Garment 20-30 mm/Hg pressure to: - to left leg daily Off-Loading: Other: - always wear shoes when walking even in the house to protect feet 1. I would recommend currently that we go ahead and continue with the wound care measures as before with regard to the lower extremity as well as the toe I feel like she is making good progress here with the dressing changes. We will use Mepitel over the lower extremity and Iodoflex for the toe. 2. I do recommend as well we continue with a 3 layer compression wrap I think that is helpful for the lower extremity and helping to keep this wound under control and get it to completely heal on the leg. 3. I do recommend continued offloading I do believe the postop surgical shoe is good to be the right way to go reason I do not offload as well. We will see patient back for reevaluation in 1 week here in the clinic. If anything worsens or changes patient will contact our office for additional recommendations. Electronic Signature(s) Signed: 06/26/2019 2:18:00 PM By: Worthy Keeler PA-C Entered By: Worthy Keeler on 06/26/2019 14:18:00 -------------------------------------------------------------------------------- SuperBill Details Patient Name: Date of Service: Tonya Hunter, Tonya Hunter 06/26/2019 Medical Record [id number] Patient Account Number: [id number] Date of Birth/Sex: Treating  RN: Feb 05, 1935 (85 y.o. [name] Primary Care Provider: [name] Other Clinician: Referring Provider: Treating Provider/Extender:Stone III, [name], Richard  A Weeks in Treatment: 5 Diagnosis Coding ICD-10 Codes Code Description I87.2 Venous insufficiency (chronic) (peripheral) L97.822 Non-pressure chronic ulcer of other part of left lower leg with fat layer exposed S81.801A Unspecified open wound, right lower leg, initial encounter E11.622 Type 2 diabetes mellitus with other skin ulcer L97.512 Non-pressure chronic ulcer of other part of right foot with fat layer exposed I10 Essential (primary) hypertension I50.42 Chronic combined systolic (congestive) and diastolic (congestive) heart failure I25.10 Atherosclerotic heart disease of native coronary artery without angina pectoris Facility Procedures CPT4 Code Description: [id number] 11042 - DEB SUBQ TISSUE 20 SQ CM/< ICD-10 Diagnosis Description L97.512 Non-pressure chronic ulcer of other part of right foot with Modifier: fat layer e Quantity: 1 xposed Physician Procedures CPT4 Code Description: [id number] 11042 - WC PHYS SUBQ TISS 20 SQ CM ICD-10 Diagnosis Description L97.512 Non-pressure chronic ulcer of other part of right foot with Modifier: fat layer e Quantity: 1 xposed Electronic Signature(s) Signed: 06/26/2019 2:18:21 PM By: Worthy Keeler PA-C Entered By: Worthy Keeler on 06/26/2019 14:18:20

## 2019-07-01 ENCOUNTER — Encounter (HOSPITAL_BASED_OUTPATIENT_CLINIC_OR_DEPARTMENT_OTHER): Payer: Medicare Other | Admitting: Internal Medicine

## 2019-07-01 ENCOUNTER — Other Ambulatory Visit: Payer: Self-pay

## 2019-07-01 DIAGNOSIS — L03031 Cellulitis of right toe: Secondary | ICD-10-CM | POA: Diagnosis not present

## 2019-07-01 DIAGNOSIS — L97812 Non-pressure chronic ulcer of other part of right lower leg with fat layer exposed: Secondary | ICD-10-CM | POA: Diagnosis not present

## 2019-07-01 DIAGNOSIS — I872 Venous insufficiency (chronic) (peripheral): Secondary | ICD-10-CM | POA: Diagnosis not present

## 2019-07-01 DIAGNOSIS — L97512 Non-pressure chronic ulcer of other part of right foot with fat layer exposed: Secondary | ICD-10-CM | POA: Diagnosis not present

## 2019-07-01 DIAGNOSIS — I5042 Chronic combined systolic (congestive) and diastolic (congestive) heart failure: Secondary | ICD-10-CM | POA: Diagnosis not present

## 2019-07-01 DIAGNOSIS — I110 Hypertensive heart disease with heart failure: Secondary | ICD-10-CM | POA: Diagnosis not present

## 2019-07-01 DIAGNOSIS — E11622 Type 2 diabetes mellitus with other skin ulcer: Secondary | ICD-10-CM | POA: Diagnosis not present

## 2019-07-01 NOTE — Progress Notes (Signed)
Tonya Hunter, Tonya Hunter ([id number]) Visit Report for 07/01/2019 Arrival Information Details Patient Name: Date of Service: Tonya Hunter 07/01/2019 10:00 AM Medical Record [id number] Patient Account Number: [id number] Date of Birth/Sex: Treating RN: March 15, 1935 (85 y.o. [name] Primary Care Donnarae Rae: [name] Other Clinician: Referring Renita Brocks: Treating Kynlee Koenigsberg/Extender:[name], [name], [name] in Treatment: 5 Visit Information History Since Last Visit Added or deleted any medications: No Patient Arrived: [name] Any new allergies or adverse reactions: No Arrival Time: 10:52 Had a fall or experienced change in No Accompanied By: self activities of daily living that may affect Transfer Assistance: None risk of falls: Patient Identification Verified: Yes Signs or symptoms of abuse/neglect since last No Secondary Verification Process Yes visito Completed: Hospitalized since last visit: No Patient Requires Transmission-Based No Implantable device outside of the clinic excluding No Precautions: cellular tissue based products placed in the center Patient Has Alerts: Yes since last visit: Patient Alerts: Patient on Blood Has Dressing in Place as Prescribed: Yes Thinner Has Compression in Place as Prescribed: Yes L ABI non Pain Present Now: Yes compressible Electronic Signature(s) Signed: 07/01/2019 6:12:41 PM By: Kela Millin Entered By: Kela Millin on 07/01/2019 10:59:35 -------------------------------------------------------------------------------- Clinic Level of Care Assessment Details Patient Name: Date of Service: Tonya Hunter 07/01/2019 10:00 AM Medical Record [id number] Patient Account Number: [id number] Date of Birth/Sex: Treating RN: 1935/04/27 (85 y.o. [name] Primary Care Anda Sobotta: [name] Other Clinician: Referring Navy Belay: Treating Wynn Alldredge/Extender:[name], [name],  [name] in Treatment: 5 Clinic Level of Care Assessment Items TOOL 4 Quantity Score X - Use when only an EandM is performed on FOLLOW-UP visit 1 0 ASSESSMENTS - Nursing Assessment / Reassessment X - Reassessment of Co-morbidities (includes updates in patient status) 1 10 X - Reassessment of Adherence to Treatment Plan 1 5 ASSESSMENTS - Wound and Skin Assessment / Reassessment X - Simple Wound Assessment / Reassessment - one wound 1 5 []  - Complex Wound Assessment / Reassessment - multiple wounds 0 []  - Dermatologic / Skin Assessment (not related to wound area) 0 ASSESSMENTS - Focused Assessment []  - Circumferential Edema Measurements - multi extremities 0 []  - Nutritional Assessment / Counseling / Intervention 0 X - Lower Extremity Assessment (monofilament, tuning fork, pulses) 1 5 []  - Peripheral Arterial Disease Assessment (using hand held doppler) 0 ASSESSMENTS - Ostomy and/or Continence Assessment and Care []  - Incontinence Assessment and Management 0 []  - Ostomy Care Assessment and Management (repouching, etc.) 0 PROCESS - Coordination of Care X - Simple Patient / Family Education for ongoing care 1 15 []  - Complex (extensive) Patient / Family Education for ongoing care 0 X - Staff obtains Programmer, systems, Records, Test Results / Process Orders 1 10 []  - Staff telephones HHA, Nursing Homes / Clarify orders / etc 0 []  - Routine Transfer to another Facility (non-emergent condition) 0 []  - Routine Hospital Admission (non-emergent condition) 0 []  - New Admissions / Biomedical engineer / Ordering NPWT, Apligraf, etc. 0 []  - Emergency Hospital Admission (emergent condition) 0 X - Simple Discharge Coordination 1 10 []  - Complex (extensive) Discharge Coordination 0 PROCESS - Special Needs []  - Pediatric / Minor Patient Management 0 []  - Isolation Patient Management 0 []  - Hearing / Language / Visual special needs 0 []  - Assessment of Community assistance (transportation, D/C  planning, etc.) 0 []  - Additional assistance / Altered mentation 0 []  - Support Surface(s) Assessment (bed, cushion, seat, etc.) 0 INTERVENTIONS - Wound Cleansing / Measurement X - Simple  Wound Cleansing - one wound 1 5 []  - Complex Wound Cleansing - multiple wounds 0 X - Wound Imaging (photographs - any number of wounds) 1 5 []  - Wound Tracing (instead of photographs) 0 X - Simple Wound Measurement - one wound 1 5 []  - Complex Wound Measurement - multiple wounds 0 INTERVENTIONS - Wound Dressings X - Small Wound Dressing one or multiple wounds 1 10 []  - Medium Wound Dressing one or multiple wounds 0 []  - Large Wound Dressing one or multiple wounds 0 X - Application of Medications - topical 1 5 []  - Application of Medications - injection 0 INTERVENTIONS - Miscellaneous []  - External ear exam 0 []  - Specimen Collection (cultures, biopsies, blood, body fluids, etc.) 0 []  - Specimen(s) / Culture(s) sent or taken to Lab for analysis 0 []  - Patient Transfer (multiple staff / Civil Service fast streamer / Similar devices) 0 []  - Simple Staple / Suture removal (25 or less) 0 []  - Complex Staple / Suture removal (26 or more) 0 []  - Hypo / Hyperglycemic Management (close monitor of Blood Glucose) 0 []  - Ankle / Brachial Index (ABI) - do not check if billed separately 0 X - Vital Signs 1 5 Has the patient been seen at the hospital within the last three years: Yes Total Score: 95 Level Of Care: New/Established - Level 3 Electronic Signature(s) Signed: 07/01/2019 6:14:31 PM By: Levan Hurst RN, BSN Entered By: Levan Hurst on 07/01/2019 14:09:22 -------------------------------------------------------------------------------- Encounter Discharge Information Details Patient Name: Date of Service: Tonya Medico. 07/01/2019 10:00 AM Medical Record [id number] Patient Account Number: [id number] Date of Birth/Sex: Treating RN: 08/25/1934 (85 y.o. Tonya Hunter, Tonya Hunter Primary Care Mane Consolo: [name] Other Clinician: Referring Mcadoo Muzquiz: Treating Kyland No/Extender:[name], [name], [name] in Treatment: 5 Encounter Discharge Information Items Discharge Condition: Stable Ambulatory Status: Walker Discharge Destination: Home Transportation: Private Auto Accompanied By: self Schedule Follow-up Appointment: Yes Clinical Summary of Care: Electronic Signature(s) Signed: 07/01/2019 6:19:44 PM By: Deon Pilling Entered By: Deon Pilling on 07/01/2019 11:53:28 -------------------------------------------------------------------------------- Lower Extremity Assessment Details Patient Name: Date of Service: MAKELLE, MARRONE 07/01/2019 10:00 AM Medical Record [id number] Patient Account Number: [id number] Date of Birth/Sex: Treating RN: 26-May-1934 (85 y.o. [name] Primary Care Miyo Aina: [name] Other Clinician: Referring Lattie Cervi: Treating Octavie Westerhold/Extender:[name], [name], [name] Weeks in Treatment: 5 Edema Assessment Assessed: [Left: No] [Right: No] Edema: [Left: Yes] [Right: Yes] Calf Left: Right: Point of Measurement: 31 cm From Medial Instep cm 35.3 cm Ankle Left: Right: Point of Measurement: 9 cm From Medial Instep cm 25.7 cm Vascular Assessment Pulses: Dorsalis Pedis Palpable: [Right:Yes] Electronic Signature(s) Signed: 07/01/2019 6:12:41 PM By: Kela Millin Entered By: Kela Millin on 07/01/2019 11:01:16 -------------------------------------------------------------------------------- Multi Wound Chart Details Patient Name: Date of Service: Tonya Medico. 07/01/2019 10:00 AM Medical Record [id number] Patient Account Number: [id number] Date of Birth/Sex: Treating RN: 25-Jul-1934 (85 y.o. 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] [N/A:N/A] Comorbid History: [2:Cataracts, Anemia, Angina, Cataracts, Anemia, Angina, N/A Congestive Heart Failure, Congestive Heart Failure, Coronary Artery Disease, Coronary Artery Disease, Hypertension,  Myocardial Hypertension, Myocardial Infarction, Type II  Diabetes, Infarction, Type II Diabetes, Osteoarthritis, Neuropathy Osteoarthritis, Neuropathy] Date Acquired: [2:05/22/2019] [3:05/29/2019] [N/A:N/A] Weeks of Treatment: [2:5] [3:3] [N/A:N/A] Wound Status: [2:Open] [3:Healed - Epithelialized] [N/A:N/A] Measurements L x W x D 1x0.9x0.3 [3:0x0x0] [N/A:N/A] (cm) Area (cm) : [2:0.707] [3:0] [N/A:N/A] Volume (cm) : [2:0.212] [3:0] [N/A:N/A] % Reduction in Area: [2:90.20%] [3:100.00%] [N/A:N/A] % Reduction in Volume: 70.70% [3:100.00%] [N/A:N/A] Classification: [2:Grade 2] [3:Full Thickness Without Exposed Support Structures] [N/A:N/A] Exudate Amount: [2:Small] [3:None Present] [N/A:N/A] Exudate Type: [2:Serosanguineous] [3:N/A] [N/A:N/A] Exudate Color: [2:red, brown] [3:N/A] [N/A:N/A] Wound Margin: [2:Flat and Intact] [3:Distinct, outline attached N/A] Granulation Amount: [2:Medium (34-66%)] [3:None Present (0%)] [N/A:N/A] Granulation Quality: [2:Pink] [3:N/A] [N/A:N/A] Necrotic Amount: [2:Medium (34-66%)] [3:None Present (0%)] [N/A:N/A] Exposed Structures: [2:Fat Layer (Subcutaneous Tissue) Exposed: Yes Fascia: No Tendon: No Muscle: No Joint: No Bone: No None] [3:Fascia: No Fat Layer (Subcutaneous Tissue) Exposed: No Tendon: No Muscle: No Joint: No  Bone: No Large (67-100%)] [N/A:N/A N/A] Treatment Notes Electronic Signature(s) Signed: 07/01/2019 6:14:31 PM By: Levan Hurst RN, BSN Signed: 07/01/2019 6:16:05 PM By: Linton Ham MD Entered By: Linton Ham on 07/01/2019 11:40:54 -------------------------------------------------------------------------------- Multi-Disciplinary Care Plan Details Patient Name: Date of Service: Tonya Medico. 07/01/2019 10:00 AM Medical Record [id number] Patient Account Number: [id number] Date of Birth/Sex: Treating RN: [birth year] (85 y.o. [name] Primary Care Alando Colleran: [name] Other Clinician: Referring Terisa Belardo: Treating Melitta Tigue/Extender:[name], [name], [name] in Treatment: 5 Active Inactive Abuse / Safety / Falls / Self Care Management Nursing Diagnoses: Potential for falls Goals: Patient/caregiver will verbalize/demonstrate measures taken to prevent injury and/or falls Date Initiated: 05/22/2019 Target Resolution Date: 07/17/2019 Goal Status: Active Interventions: Assess fall risk on admission and as needed Assess impairment of mobility on admission and as needed per policy Assess personal safety and home safety (as indicated) on admission and as needed Notes: Nutrition Nursing Diagnoses: Impaired glucose control: actual or potential Goals: Patient/caregiver will maintain therapeutic glucose control Date Initiated: 05/22/2019 Target Resolution Date: 07/17/2019 Goal Status: Active Interventions: Assess HgA1c results as ordered upon admission and as needed Assess patient nutrition upon admission and as needed per policy Provide education on elevated blood sugars and impact on wound healing Treatment Activities: Patient referred to Primary Care Physician for further nutritional evaluation : 05/22/2019 Notes: Wound/Skin Impairment Nursing Diagnoses: Impaired tissue integrity Knowledge deficit related to ulceration/compromised skin  integrity Goals: Patient/caregiver will verbalize understanding of skin care regimen Date Initiated: 05/22/2019 Target Resolution Date: 07/17/2019 Goal Status: Active Ulcer/skin breakdown will have a volume reduction of 30% by week 4 Date Initiated: 05/22/2019 Date Inactivated: 06/19/2019 Target Resolution Date: 06/19/2019 Goal Status: Met Ulcer/skin breakdown will have a volume reduction of 50% by week 8 Date Initiated: 06/19/2019 Target Resolution Date: 07/17/2019 Goal Status: Active Interventions: Assess patient/caregiver ability to obtain necessary supplies Assess patient/caregiver ability to perform ulcer/skin care regimen upon admission and as needed Assess ulceration(s) every visit Provide education on ulcer and skin care Treatment Activities: Skin care regimen initiated : 05/22/2019 Topical wound management initiated : 05/22/2019 Notes: Electronic Signature(s) Signed: 07/01/2019 6:14:31 PM By: Levan Hurst RN, BSN Entered By: Levan Hurst on 07/01/2019 14:08:41 -------------------------------------------------------------------------------- Pain Assessment Details Patient Name: Date of Service: Tonya Medico 07/01/2019 10:00 AM Medical Record [id number] Patient Account Number: [id number] Date of Birth/Sex: Treating RN: 11-05-1934 (85 y.o. [name] Primary Care Tarance Balan: [name] Other Clinician: Referring Lyan Moyano: Treating Tabithia Stroder/Extender:[name], [name], [name] in Treatment: 5 Active Problems Location of Pain Severity and Description  of Pain Patient Has Paino Yes Site Locations Pain Location: Pain in Ulcers With Dressing Change: Yes Duration of the Pain. Constant / Intermittento Constant Character of Pain Describe the Pain: Aching, Burning Pain Management and Medication Current Pain Management: Electronic Signature(s) Signed: 07/01/2019 6:12:41 PM By: Kela Millin Entered By: Kela Millin on 07/01/2019  11:00:38 -------------------------------------------------------------------------------- Patient/Caregiver Education Details Patient Name: Date of Service: Reinig, Tanee S. 2/15/2021andnbsp10:00 AM Medical Record [id number] Patient Account Number: [id number] Date of Birth/Gender: Treating RN: 1935/02/01 (85 y.o. [name] Primary Care Physician: [name] Other Clinician: Referring Physician: Treating Physician/Extender:[name], [name], [name] in Treatment: 5 Education Assessment Education Provided To: Patient Education Topics Provided Infection: Methods: Explain/Verbal Responses: State content correctly Wound/Skin Impairment: Methods: Explain/Verbal Responses: State content correctly Electronic Signature(s) Signed: 07/01/2019 6:14:31 PM By: Levan Hurst RN, BSN Entered By: Levan Hurst on 07/01/2019 14:08:58 -------------------------------------------------------------------------------- Wound Assessment Details Patient Name: Date of Service: Tonya Medico 07/01/2019 10:00 AM Medical Record [id number] Patient Account Number: [id number] Date of Birth/Sex: Treating RN: 1934-09-01 (85 y.o. [name] Primary Care Arianny Pun: [name] Other Clinician: Referring Narjis Mira: Treating Kayven Aldaco/Extender:[name], [name], [name] Weeks in Treatment: 5 Wound Status Wound Number: 2 Primary Diabetic Wound/Ulcer of the Lower Extremity Etiology: Wound Location: Right Toe Great - Medial Wound Open Wounding Event: Blister Status: Date Acquired: 05/22/2019 Comorbid Cataracts, Anemia, Angina, Congestive Heart Weeks Of Treatment: 5 History: Failure, Coronary Artery Disease, Clustered Wound: No Hypertension, Myocardial Infarction, Type II Diabetes, Osteoarthritis, Neuropathy Photos Wound Measurements Length: (cm) 1 % Red Width: (cm) 0.9 % Red Depth: (cm) 0.3 Epith Area: (cm) 0.707 Tunn Volume: (cm) 0.212  Unde Wound Description Classification: Grade 2 Foul Wound Margin: Flat and Intact Slou Exudate Amount: Small Exudate Type: Serosanguineous Exudate Color: red, brown Wound Bed Granulation Amount: Medium (34-66%) Granulation Quality: Pink Fascia Exp Necrotic Amount: Medium (34-66%) Fat Layer Necrotic Quality: Adherent Slough Tendon Exp Muscle Exp Joint Expo Bone Expos Odor After Cleansing: No gh/Fibrino Yes Exposed Structure osed: No (Subcutaneous Tissue) Exposed: Yes osed: No osed: No sed: No ed: No uction in Area: 90.2% uction in Volume: 70.7% elialization: None eling: No rmining: No Treatment Notes Wound #2 (Right, Medial Toe Great) 1. Cleanse With Wound Cleanser Soap and water 2. Periwound Care Moisturizing lotion 3. Primary Dressing Applied Calcium Alginate Ag 4. Secondary Dressing Dry Gauze Roll Gauze Foam 5. Secured With Medipore tape Notes netting. Electronic Signature(s) Signed: 07/01/2019 4:24:03 PM By: Mikeal Hawthorne EMT/HBOT Signed: 07/01/2019 6:14:31 PM By: Levan Hurst RN, BSN Entered By: Mikeal Hawthorne on 07/01/2019 16:08:54 -------------------------------------------------------------------------------- Wound Assessment Details Patient Name: Date of Service: Tonya Medico 07/01/2019 10:00 AM Medical Record [id number] Patient Account Number: [id number] Date of Birth/Sex: Treating RN: 1935-05-12 (85 y.o. [name] Primary Care Devonne Lalani: [name] Other Clinician: Referring Dennise Bamber: Treating Rasheda Ledger/Extender:[name], [name], [name] Weeks in Treatment: 5 Wound Status Wound Number: 3 Primary Skin Tear Etiology: Wound Location: Right Lower Leg - Anterior Wound Healed - Epithelialized Wounding Event: Gradually Appeared Status: Date Acquired: 05/29/2019 ComorbidCataracts, Anemia, Angina, Congestive Heart Weeks Of Treatment: 3 Weeks Of Treatment: 3 History: Failure, Coronary Artery Disease, Clustered  Wound: No Hypertension, Myocardial Infarction, Type II Diabetes, Osteoarthritis, Neuropathy Photos Wound Measurements Length: (cm) 0 % Reduct Width: (cm) 0 % Reduct Depth: (cm) 0 Epitheli Area: (cm) 0 Tunneli Volume: (cm) 0 Undermi Wound Description Classification: Full Thickness Without Exposed Support Foul Od Structures Slough/ Wound Distinct, outline attached Margin: Exudate None Present Amount: Wound Bed  Granulation Amount: None Present (0%) Necrotic Amount: None Present (0%) Fascia Fat Lay Tendon Muscle Joint E Bone Ex or After Cleansing: No Fibrino No Exposed Structure Exposed: No er (Subcutaneous Tissue) Exposed: No Exposed: No Exposed: No xposed: No posed: No ion in Area: 100% ion in Volume: 100% alization: Large (67-100%) ng: No ning: No Electronic Signature(s) Signed: 07/01/2019 4:24:03 PM By: Mikeal Hawthorne EMT/HBOT Signed: 07/01/2019 6:14:31 PM By: Levan Hurst RN, BSN Entered By: Mikeal Hawthorne on 07/01/2019 16:08:34 -------------------------------------------------------------------------------- [place] Details Patient Name: Date of Service: Tonya Medico. 07/01/2019 10:00 AM Medical Record [id number] Patient Account Number: [id number] Date of Birth/Sex: Treating RN: 16-Mar-1935 (85 y.o. [name] Primary Care Roshawn Lacina: [name] Other Clinician: Referring Elyzabeth Goatley: Treating Anacarolina Evelyn/Extender:[name], [name], [name] in Treatment: 5 Vital Signs Time Taken: 10:59 Temperature (F): 97.6 Height (in): 66 Pulse (bpm): 67 Weight (lbs): 164 Respiratory Rate (breaths/min): 19 Body Mass Index (BMI): 26.5 Blood Pressure (mmHg): 127/55 Capillary Blood Glucose (mg/dl): 154 Reference Range: 80 - 120 mg / dl Notes patient reported last CBG was 154 Electronic Signature(s) Signed: 07/01/2019 6:12:41 PM By: Kela Millin Entered By: Kela Millin on 07/01/2019 11:00:13

## 2019-07-01 NOTE — Progress Notes (Signed)
Tonya Hunter, RACETTE ([id number]) Visit Report for 07/01/2019 HPI Details Patient Name: Date of Service: Tonya Hunter 07/01/2019 10:00 AM Medical Record [id number] Patient Account Number: [id number] Date of Birth/Sex: Treating RN: 08/27/1934 (85 y.o. [name] Primary Care Provider: [name] Other Clinician: Referring Provider: Treating Provider/Extender:Loy Little, [name], [name] in Treatment: 5 History of Present Illness HPI Description: 05/22/2019 patient is seen today in regard to her bilateral lower extremities this was a referral from her primary care provider they were concerned about areas of bruising and contusion that happened on a regular basis. With that being said the patient really has an issue with her right great toe where she has a significant blister that wraps around to the underside of the toe that has me more concerned to be perfectly honest than the legs themselves do they seem to be doing quite well. I explained in regard to the legs she is good to have ongoing and continued issues with blisters based on the fragility of her skin as long as she is on the Plavix and aspirin this is likely to be an ongoing issue. Honestly what she is going need to do is protect that area. In regard to the toe however that started 2 days ago and she tells me it seems to be getting worse each day. No fevers, chills, nausea, vomiting, or diarrhea. 05/29/2019 on evaluation today patient appears to be doing much better with regard to her leg which in fact appears to be completely healed. That is on the left. In regard to her right great toe this is showing signs of improvement I am very pleased with how things seem to be progressing after we had removed the blistered skin down to an open wound last week. Overall I feel like there is new tissue growth and I am very excited that this in 1 week is doing so well. 06/05/2019 on evaluation today patient appears to  be doing actually fairly well in regard to her toe ulcer. Unfortunately she does have a skin tear on the anterior surface of her right lower extremity which unfortunately is a little bit rolled under on the edge of the skin tear. Nonetheless I think we may be able to straighten this out and if so that would go a long way to help in this area to heal more effectively and quickly. Fortunately there is no signs of active infection at this time. She does have some swelling but again I think this is secondary to the skin tear as opposed to anything else. 06/12/2019 on evaluation today patient appears to be doing about the same in regard to the toe. With that being said she is having issues with the Steri-Strips having come off on her leg and there was some skin that actually necrosis and did slough off at this point. Fortunately there is no evidence of active infection at this time. She is very concerned about the possibility of amputation she states her daughter is also very concerned. With that being said I have not seen her daughter up to this point. Also have not really realized until today that the patient may have a component of dementia that may be preventing complete compliance and remembering of what needs to be done as far as taking care of her wound. 06/19/2019 on evaluation today patient appears to be doing well with regard to her lower extremity ulcer the toe ulcer is measuring better size wise but does have a little bit  more depth. Again I do think we may want proceed with an x- ray at this point in order to further evaluate this and see if there is anything we need to do to aid in getting this to heal more appropriately and quickly. Fortunately there is no signs of systemic infection at this time. 06/26/2019 upon evaluation today patient actually appears to be doing quite well with regard to her lower extremity ulcer and her toe ulcer. I did prescribe her doxycycline in the interim since I  last saw her last week and this week. She contacted the office on Friday and wanted to see about getting something at that point based on the results of the x-ray finding. There did not appear to be any signs of osteomyelitis but there was some evidence potentially of infection. Nonetheless I did send this in for her. 2/15; patient was worked in the clinic today after calling with severe pain in the right great toe. She had an x-ray done last week that was negative. She was put on doxycycline because of possible cellulitis. There is significant erythema in the dorsal aspect of the right great toe. The remaining wound is on the plantar aspect of the interphalangeal joint of the right great toe. Other wounds that were on the lower extremities seem healed. We have been using Iodoflex to the wound on the right great toe The patient has a penicillin allergy dating back into her early 18s at that point in time she developed swelling in the foot and ataxia of gait. She did not have a rash, anaphylactoid issues as far as I can tell and as far she remembers Electronic Signature(s) Signed: 07/01/2019 6:16:05 PM By: Linton Ham MD Entered By: Linton Ham on 07/01/2019 11:42:26 -------------------------------------------------------------------------------- Physical Exam Details Patient Name: Date of Service: [name] 07/01/2019 10:00 AM Medical Record [id number] Patient Account Number: [id number] Date of Birth/Sex: Treating RN: [birth year] (85 y.o. [name] Primary Care Provider: [name] Other Clinician: Referring Provider: Treating Provider/Extender:Salwa Bai, [name], [name] Weeks in Treatment: 5 Constitutional Sitting or standing Blood Pressure is within target range for patient.. Pulse regular and within target range for patient.Marland Kitchen Respirations regular, non-labored and within target range.. Temperature is normal and within the target range for the  patient.Marland Kitchen Appears in no distress. She does not appear to be systemically unwell. Respiratory work of breathing is normal. Cardiovascular She has a palpable dorsalis pedis pulse on the right but not a posterior tibial, popliteal or femoral nevertheless she does not appear to have an acutely ischemic situation in her right foot.. Musculoskeletal He is tender over the inner phalangeal joint and somewhat tender over the MTP but there is no evidence of an effusion. Notes Wound exam; right fifth toe; there is intense erythema and tenderness over the entire dorsal aspect of the toe down to the base of the toe. Extending slightly into the webspace between the first and second toe. All of this is tender and warm. The wound is on the plantar aspect of the toe does not look ominous there is certainly nothing here that looks like it could be cultured. Electronic Signature(s) Signed: 07/01/2019 6:16:05 PM By: Linton Ham MD Entered By: Linton Ham on 07/01/2019 11:44:18 -------------------------------------------------------------------------------- Physician Orders Details Patient Name: Date of Service: [name] 07/01/2019 10:00 AM Medical Record [id number] Patient Account Number: [id number] Date of Birth/Sex: Treating RN: Feb 26, 1935 (85 y.o. 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 Skin Barriers/Peri-Wound Care Moisturizing lotion - to right leg Wound Cleansing Wound #2 Right,Medial Toe Great Clean wound with Wound Cleanser Primary Wound Dressing Wound #2 Right,Medial Toe Great Calcium Alginate with Silver Secondary Dressing Wound #2 Right,Medial Toe Great Foam - donut Kerlix/Rolled Gauze Dry Gauze Edema Control Avoid standing for long periods of time Elevate legs to the level of the heart or above for 30 minutes daily and/or when sitting, a frequency of: - whenever sitting throughout the day Exercise regularly Support Garment 20-30 mm/Hg pressure to: - to both legs daily Off-Loading Other: - always wear shoes when walking even in the house to protect feet Patient Medications Allergies: penicillin, Demerol Notifications Medication Indication Start End cephalexin 07/01/2019 DOSE oral 500 mg capsule - 1 capsule oral tid for 1 week (d/c doxycycline) Electronic Signature(s) Signed: 07/01/2019 11:46:24 AM By: Linton Ham MD Entered By: Linton Ham on 07/01/2019 11:46:24 -------------------------------------------------------------------------------- Problem List Details Patient Name: Date of Service: [name] 07/01/2019 10:00 AM Medical Record [id number] Patient Account Number: [id number] Date of Birth/Sex: Treating RN: [birth year] (85 y.o. [name] Primary Care Provider: [name] Other Clinician: Referring Provider: Treating Provider/Extender:Braedan Meuth, [name], [name] in Treatment: 5 Active Problems ICD-10 Evaluated Encounter Code Description  Active Date Today Diagnosis I87.2 Venous insufficiency (chronic) (peripheral) 05/22/2019 No Yes L97.822 Non-pressure chronic ulcer of other part of left lower 05/22/2019 No Yes leg with fat layer exposed S81.801A Unspecified open wound, right lower leg, initial 06/05/2019 No Yes encounter E11.622 Type 2 diabetes mellitus with other skin ulcer 05/22/2019 No Yes L97.512 Non-pressure chronic ulcer of other part of right foot 05/22/2019 No Yes with fat layer exposed I10 Essential (primary) hypertension 05/22/2019 No Yes I50.42 Chronic combined systolic (congestive) and diastolic 08/23/4494 No Yes (congestive) heart failure I25.10 Atherosclerotic heart disease of native coronary artery 05/22/2019 No Yes without angina pectoris L03.115 Cellulitis of right lower limb 07/01/2019 No Yes Inactive Problems Resolved Problems Electronic Signature(s) Signed: 07/01/2019 6:16:05 PM By: Linton Ham MD Entered By: Linton Ham on 07/01/2019 11:39:01 -------------------------------------------------------------------------------- Progress Note Details Patient Name: Date of Service: [name] 07/01/2019 10:00 AM Medical Record [id number] Patient Account Number: [id number] Date of Birth/Sex: Treating RN: [birth year] (85 y.o. [name] Primary Care Provider: [name] Other Clinician: Referring Provider: Treating Provider/Extender:Tatayana Beshears, [name], [name] in Treatment: 5 Subjective History of Present Illness (HPI) 05/22/2019 patient is seen today in regard to her bilateral lower extremities this was a referral from her primary care provider they were concerned about areas of bruising and contusion that happened on a regular basis. With that being said the patient really has an issue with her right great toe where she has a significant blister that wraps around to the underside of the toe that has me more  concerned to be perfectly honest than the legs themselves  do they seem to be doing quite well. I explained in regard to the legs she is good to have ongoing and continued issues with blisters based on the fragility of her skin as long as she is on the Plavix and aspirin this is likely to be an ongoing issue. Honestly what she is going need to do is protect that area. In regard to the toe however that started 2 days ago and she tells me it seems to be getting worse each day. No fevers, chills, nausea, vomiting, or diarrhea. 05/29/2019 on evaluation today patient appears to be doing much better with regard to her leg which in fact appears to be completely healed. That is on the left. In regard to her right great toe this is showing signs of improvement I am very pleased with how things seem to be progressing after we had removed the blistered skin down to an open wound last week. Overall I feel like there is new tissue growth and I am very excited that this in 1 week is doing so well. 06/05/2019 on evaluation today patient appears to be doing actually fairly well in regard to her toe ulcer. Unfortunately she does have a skin tear on the anterior surface of her right lower extremity which unfortunately is a little bit rolled under on the edge of the skin tear. Nonetheless I think we may be able to straighten this out and if so that would go a long way to help in this area to heal more effectively and quickly. Fortunately there is no signs of active infection at this time. She does have some swelling but again I think this is secondary to the skin tear as opposed to anything else. 06/12/2019 on evaluation today patient appears to be doing about the same in regard to the toe. With that being said she is having issues with the Steri-Strips having come off on her leg and there was some skin that actually necrosis and did slough off at this point. Fortunately there is no evidence of active infection at this time. She is very concerned about the possibility of  amputation she states her daughter is also very concerned. With that being said I have not seen her daughter up to this point. Also have not really realized until today that the patient may have a component of dementia that may be preventing complete compliance and remembering of what needs to be done as far as taking care of her wound. 06/19/2019 on evaluation today patient appears to be doing well with regard to her lower extremity ulcer the toe ulcer is measuring better size wise but does have a little bit more depth. Again I do think we may want proceed with an x- ray at this point in order to further evaluate this and see if there is anything we need to do to aid in getting this to heal more appropriately and quickly. Fortunately there is no signs of systemic infection at this time. 06/26/2019 upon evaluation today patient actually appears to be doing quite well with regard to her lower extremity ulcer and her toe ulcer. I did prescribe her doxycycline in the interim since I last saw her last week and this week. She contacted the office on Friday and wanted to see about getting something at that point based on the results of the x-ray finding. There did not appear to be any signs of osteomyelitis but there was some  evidence potentially of infection. Nonetheless I did send this in for her. 2/15; patient was worked in the clinic today after calling with severe pain in the right great toe. She had an x-ray done last week that was negative. She was put on doxycycline because of possible cellulitis. There is significant erythema in the dorsal aspect of the right great toe. The remaining wound is on the plantar aspect of the interphalangeal joint of the right great toe. Other wounds that were on the lower extremities seem healed. We have been using Iodoflex to the wound on the right great toe The patient has a penicillin allergy dating back into her early 35s at that point in time she developed  swelling in the foot and ataxia of gait. She did not have a rash, anaphylactoid issues as far as I can tell and as far she remembers Objective Constitutional Sitting or standing Blood Pressure is within target range for patient.. Pulse regular and within target range for patient.Marland Kitchen Respirations regular, non-labored and within target range.. Temperature is normal and within the target range for the patient.Marland Kitchen Appears in no distress. She does not appear to be systemically unwell. Vitals Time Taken: 10:59 AM, Height: 66 in, Weight: 164 lbs, BMI: 26.5, Temperature: 97.6 F, Pulse: 67 bpm, Respiratory Rate: 19 breaths/min, Blood Pressure: 127/55 mmHg, Capillary Blood Glucose: 154 mg/dl. General Notes: patient reported last CBG was 154 Respiratory work of breathing is normal. Cardiovascular She has a palpable dorsalis pedis pulse on the right but not a posterior tibial, popliteal or femoral nevertheless she does not appear to have an acutely ischemic situation in her right foot.. Musculoskeletal He is tender over the inner phalangeal joint and somewhat tender over the MTP but there is no evidence of an effusion. General Notes: Wound exam; right fifth toe; there is intense erythema and tenderness over the entire dorsal aspect of the toe down to the base of the toe. Extending slightly into the webspace between the first and second toe. All of this is tender and warm. The wound is on the plantar aspect of the toe does not look ominous there is certainly nothing here that looks like it could be cultured. Integumentary (Hair, Skin) Wound #2 status is Open. Original cause of wound was Blister. The wound is located on the [employer]. The wound measures 1cm length x 0.9cm width x 0.3cm depth; 0.707cm^2 area and 0.212cm^3 volume. There is Fat Layer (Subcutaneous Tissue) Exposed exposed. There is no tunneling or undermining noted. There is a small amount of serosanguineous drainage noted. The  wound margin is flat and intact. There is medium (34-66%) pink granulation within the wound bed. There is a medium (34-66%) amount of necrotic tissue within the wound bed including Adherent Slough. Wound #3 status is Healed - Epithelialized. Original cause of wound was Gradually Appeared. The wound is located on the Right,Anterior Lower Leg. The wound measures 0cm length x 0cm width x 0cm depth; 0cm^2 area and 0cm^3 volume. There is no tunneling or undermining noted. There is a none present amount of drainage noted. The wound margin is distinct with the outline attached to the wound base. There is no granulation within the wound bed. There is no necrotic tissue within the wound bed. Assessment Active Problems ICD-10 Venous insufficiency (chronic) (peripheral) Non-pressure chronic ulcer of other part of left lower leg with fat layer exposed Unspecified open wound, right lower leg, initial encounter Type 2 diabetes mellitus with other skin ulcer Non-pressure chronic ulcer of other  part of right foot with fat layer exposed Essential (primary) hypertension Chronic combined systolic (congestive) and diastolic (congestive) heart failure Atherosclerotic heart disease of native coronary artery without angina pectoris Cellulitis of right lower limb Plan Follow-up Appointments: Return Appointment in: - Keep appt for Wednesday 2/17 Dressing Change Frequency: Wound #2 Right,Medial Toe Great: Change Dressing every other day. Skin Barriers/Peri-Wound Care: Moisturizing lotion - to right leg Wound Cleansing: Wound #2 Right,Medial Toe Great: Clean wound with Wound Cleanser Primary Wound Dressing: Wound #2 Right,Medial Toe Great: Calcium Alginate with Silver Secondary Dressing: Wound #2 Right,Medial Toe Great: Foam - donut Kerlix/Rolled Gauze Dry Gauze Edema Control: Avoid standing for long periods of time Elevate legs to the level of the heart or above for 30 minutes daily and/or when  sitting, a frequency of: - whenever sitting throughout the day Exercise regularly Support Garment 20-30 mm/Hg pressure to: - to both legs daily Off-Loading: Other: - always wear shoes when walking even in the house to protect feet The following medication(s) was prescribed: cephalexin oral 500 mg capsule 1 capsule oral tid for 1 week (d/c doxycycline) starting 07/01/2019 1. It looks as though she has a cellulitis in the dorsal great toe which has been resistant to doxycycline. I switched to Keflex 2. She has a very odd description of a reaction to penicillin when she was 23 or so I am doubtful this represents a true allergy. 3. She has an estimated creatinine clearance of 42 based on lab work she had done in December we use Keflex 500 3 times daily for 1 week 4. She has a follow-up on Wednesday in this clinic however I have urged her if the erythema extends out of the area of the dorsal aspect of her right great toe into her foot or streaks up her foot then she is to seek more acute medical attention urgent care, her own primary or the ER. 5. I did not think we needed to send her to the ER today. She is not systemically unwell 6. I wonder about her arterial status although the ABGs in our clinic were reasonably normal. I do not think this is acute or chronic ischemia however Electronic Signature(s) Signed: 07/01/2019 6:16:05 PM By: Linton Ham MD Entered By: Linton Ham on 07/01/2019 11:48:12 -------------------------------------------------------------------------------- SuperBill Details Patient Name: Date of Service: [name] 07/01/2019 Medical Record [id number] Patient Account Number: [id number] Date of Birth/Sex: Treating RN: [birth year] (85 y.o. [name] Primary Care Provider: [name] Other Clinician: Referring Provider: Treating Provider/Extender:Luisenrique Conran, [name], [name] in Treatment: 5 Diagnosis Coding ICD-10  Codes Code Description I87.2 Venous insufficiency (chronic) (peripheral) L97.822 Non-pressure chronic ulcer of other part of left lower leg with fat layer exposed S81.801A Unspecified open wound, right lower leg, initial encounter E11.622 Type 2 diabetes mellitus with other skin ulcer L97.512 Non-pressure chronic ulcer of other part of right foot with fat layer exposed I10 Essential (primary) hypertension I50.42 Chronic combined systolic (congestive) and diastolic (congestive) heart failure I25.10 Atherosclerotic heart disease of native coronary artery without angina pectoris L03.115 Cellulitis of right lower limb Facility Procedures CPT4 Code: [id number] Description: 99213 - WOUND CARE VISIT-LEV 3 EST PT Modifier: Quantity: 1 Physician Procedures CPT4 Code: [id number] Description: 99214 - WC PHYS LEVEL 4 - EST PT ICD-10 Diagnosis Description L03.115 Cellulitis of right lower limb S81.801A Unspecified open wound, right lower leg, initial encou Modifier: nter Quantity: 1 Electronic Signature(s) Signed: 07/01/2019 6:14:31 PM By: Levan Hurst RN, BSN Signed: 07/01/2019 6:16:05 PM By:  Linton Ham MD Entered By: Levan Hurst on 07/01/2019 14:09:39

## 2019-07-03 ENCOUNTER — Other Ambulatory Visit: Payer: Self-pay

## 2019-07-03 ENCOUNTER — Encounter (HOSPITAL_BASED_OUTPATIENT_CLINIC_OR_DEPARTMENT_OTHER): Payer: Medicare Other | Admitting: Physician Assistant

## 2019-07-03 ENCOUNTER — Other Ambulatory Visit: Payer: Self-pay | Admitting: Physician Assistant

## 2019-07-03 ENCOUNTER — Other Ambulatory Visit (HOSPITAL_COMMUNITY): Payer: Self-pay | Admitting: Physician Assistant

## 2019-07-03 DIAGNOSIS — E11621 Type 2 diabetes mellitus with foot ulcer: Secondary | ICD-10-CM | POA: Diagnosis not present

## 2019-07-03 DIAGNOSIS — I5042 Chronic combined systolic (congestive) and diastolic (congestive) heart failure: Secondary | ICD-10-CM | POA: Diagnosis not present

## 2019-07-03 DIAGNOSIS — I110 Hypertensive heart disease with heart failure: Secondary | ICD-10-CM | POA: Diagnosis not present

## 2019-07-03 DIAGNOSIS — E11622 Type 2 diabetes mellitus with other skin ulcer: Secondary | ICD-10-CM | POA: Diagnosis not present

## 2019-07-03 DIAGNOSIS — L97512 Non-pressure chronic ulcer of other part of right foot with fat layer exposed: Secondary | ICD-10-CM | POA: Diagnosis not present

## 2019-07-03 DIAGNOSIS — I872 Venous insufficiency (chronic) (peripheral): Secondary | ICD-10-CM | POA: Diagnosis not present

## 2019-07-03 DIAGNOSIS — L97812 Non-pressure chronic ulcer of other part of right lower leg with fat layer exposed: Secondary | ICD-10-CM | POA: Diagnosis not present

## 2019-07-03 NOTE — Progress Notes (Addendum)
Tonya Hunter, Tonya Hunter ([id number]) Visit Report for 07/03/2019 Chief Complaint Document Details Patient Name: Date of Service: Tonya Hunter, Tonya Hunter 07/03/2019 11:00 AM Medical Record [id number] Patient Account Number: [id number] Date of Birth/Sex: Treating RN: 11-19-1934 (85 y.o. [name] Primary Care Provider: [name] Other Clinician: Referring Provider: Treating Provider/Extender:Stone III, [name], [name] Weeks in Treatment: 6 Information Obtained from: Patient Chief Complaint Right LE skin tear and right 1st toe ulcer Electronic Signature(s) Signed: 07/03/2019 11:37:29 AM By: Worthy Keeler PA-C Entered By: Worthy Keeler on 07/03/2019 11:37:29 -------------------------------------------------------------------------------- Debridement Details Patient Name: Date of Service: Tonya Hunter. 07/03/2019 11:00 AM Medical Record [id number] Patient Account Number: [id number] Date of Birth/Sex: Treating RN: [birth year] (84 y.o. [name] Primary Care Provider: [name] Other Clinician: Referring Provider: Treating Provider/Extender:Stone III, [name], [name] Weeks in Treatment: 6 Debridement Performed for Wound #2 Right,Medial Toe Great Assessment: Performed By: Physician Worthy Keeler, PA Debridement Type: Debridement Severity of Tissue Pre Fat layer exposed Debridement: Level of Consciousness (Pre- Awake and Alert procedure): Pre-procedure Verification/Time Out Taken: Yes - 12:35 Start Time: 12:36 Pain Control: Other : Benzocaine 20% Total Area Debrided (L x W): 2.5 (cm) x 2 (cm) = 5 (cm) Tissue and other material Viable, Non-Viable, Callus, Slough, Subcutaneous, Skin: Epidermis, Slough debrided: Level: Skin/Subcutaneous Tissue Debridement Description: Excisional Instrument: Curette Bleeding: Minimum Hemostasis Achieved: Pressure End Time: 12:43 Procedural Pain: 2 Post Procedural Pain: 0 Response to  Treatment: Procedure was tolerated well Level of Consciousness Awake and Alert (Post-procedure): Post Debridement Measurements of Total Wound Length: (cm) 1.3 Width: (cm) 1.6 Depth: (cm) 0.3 Volume: (cm) 0.49 Character of Wound/Ulcer Post Improved Debridement: Severity of Tissue Post Debridement: Fat layer exposed Post Procedure Diagnosis Same as Pre-procedure Electronic Signature(s) Signed: 07/04/2019 12:37:21 PM By: Worthy Keeler PA-C Signed: 07/05/2019 6:14:01 PM By: [name] RN, BSN Entered By: [name] on 07/03/2019 12:45:43 -------------------------------------------------------------------------------- HPI Details Patient Name: Date of Service: Tonya Hunter. 07/03/2019 11:00 AM Medical Record [id number] Patient Account Number: [id number] Date of Birth/Sex: Treating RN: [birth year] (85 y.o. [name] Primary Care Provider: [name] Other Clinician: Referring Provider: Treating Provider/Extender:Stone III, [name], [name] Weeks in Treatment: 6 History of Present Illness HPI Description: 05/22/2019 patient is seen today in regard to her bilateral lower extremities this was a referral from her primary care provider they were concerned about areas of bruising and contusion that happened on a regular basis. With that being said the patient really has an issue with her right great toe where she has a significant blister that wraps around to the underside of the toe that has me more concerned to be perfectly honest than the legs themselves do they seem to be doing quite well. I explained in regard to the legs she is good to have ongoing and continued issues with blisters based on the fragility of her skin as long as she is on the Plavix and aspirin this is likely to be an ongoing issue. Honestly what she is going need to do is protect that area. In regard to the toe however that started 2 days ago and she tells me it seems to be  getting worse each day. No fevers, chills, nausea, vomiting, or diarrhea. 05/29/2019 on evaluation today patient appears to be doing much better with regard to her leg which in fact appears to be completely healed. That is on the left. In regard to her right great toe this is  showing signs of improvement I am very pleased with how things seem to be progressing after we had removed the blistered skin down to an open wound last week. Overall I feel like there is new tissue growth and I am very excited that this in 1 week is doing so well. 06/05/2019 on evaluation today patient appears to be doing actually fairly well in regard to her toe ulcer. Unfortunately she does have a skin tear on the anterior surface of her right lower extremity which unfortunately is a little bit rolled under on the edge of the skin tear. Nonetheless I think we may be able to straighten this out and if so that would go a long way to help in this area to heal more effectively and quickly. Fortunately there is no signs of active infection at this time. She does have some swelling but again I think this is secondary to the skin tear as opposed to anything else. 06/12/2019 on evaluation today patient appears to be doing about the same in regard to the toe. With that being said she is having issues with the Steri-Strips having come off on her leg and there was some skin that actually necrosis and did slough off at this point. Fortunately there is no evidence of active infection at this time. She is very concerned about the possibility of amputation she states her daughter is also very concerned. With that being said I have not seen her daughter up to this point. Also have not really realized until today that the patient may have a component of dementia that may be preventing complete compliance and remembering of what needs to be done as far as taking care of her wound. 06/19/2019 on evaluation today patient appears to be doing well  with regard to her lower extremity ulcer the toe ulcer is measuring better size wise but does have a little bit more depth. Again I do think we may want proceed with an x- ray at this point in order to further evaluate this and see if there is anything we need to do to aid in getting this to heal more appropriately and quickly. Fortunately there is no signs of systemic infection at this time. 06/26/2019 upon evaluation today patient actually appears to be doing quite well with regard to her lower extremity ulcer and her toe ulcer. I did prescribe her doxycycline in the interim since I last saw her last week and this week. She contacted the office on Friday and wanted to see about getting something at that point based on the results of the x-ray finding. There did not appear to be any signs of osteomyelitis but there was some evidence potentially of infection. Nonetheless I did send this in for her. 2/15; patient was worked in the clinic today after calling with severe pain in the right great toe. She had an x-ray done last week that was negative. She was put on doxycycline because of possible cellulitis. There is significant erythema in the dorsal aspect of the right great toe. The remaining wound is on the plantar aspect of the interphalangeal joint of the right great toe. Other wounds that were on the lower extremities seem healed. We have been using Iodoflex to the wound on the right great toe The patient has a penicillin allergy dating back into her early 38s at that point in time she developed swelling in the foot and ataxia of gait. She did not have a rash, anaphylactoid issues as far as I  can tell and as far she remembers 07/03/19 upon evaluation today patient appears to be doing still poorly in regard to her great toe ulcer. Unfortunately she tells me that she is still having discomfort she did have to see Dr. [name] due to things not doing so well on the 15th which was just a couple of days  ago. He did switcher at that time as far as the antibiotics were concerned to a different oral antibiotic. This was Keflex. With that being said she maybe does appear to be doing somewhat better though she does have a small abscess area in the toe I am still really feeling like there may be something greater going on here I think the patient may need to have them ride to further evaluate the situation. Electronic Signature(s) Signed: 07/04/2019 12:37:21 PM By: Worthy Keeler PA-C Entered By: Worthy Keeler on 07/04/2019 12:34:00 -------------------------------------------------------------------------------- Physical Exam Details Patient Name: Date of Service: CYNDEL, GRIFFEY 07/03/2019 11:00 AM Medical Record [id number] Patient Account Number: [id number] Date of Birth/Sex: Treating RN: [birth year] (85 y.o. [name] Primary Care Provider: [name] Other Clinician: Referring Provider: Treating Provider/Extender:Stone III, [name], [name] Weeks in Treatment: 6 Constitutional Well-nourished and well-hydrated in no acute distress. Respiratory normal breathing without difficulty. Psychiatric this patient is able to make decisions and demonstrates good insight into disease process. Alert and Oriented x 3. pleasant and cooperative. Notes Patient's wound bed currently showed signs of good granulation at this time. Fortunately there's no signs of active infection which is good news. No fevers, chills, nausea, or vomiting noted at this time. With that being said there does appear to be some evidence of infection including a small abscess in a couple of areas which connect to the main wound over the distal portion of the toe I did actually debride away the callous opening up these abscess pocket so that it could appropriately drain. Nonetheless I think the patient is gonna require an MRI to further evaluate this. Electronic Signature(s) Signed: 07/04/2019  12:37:21 PM By: Worthy Keeler PA-C Entered By: Worthy Keeler on 07/04/2019 12:34:44 -------------------------------------------------------------------------------- Physician Orders Details Patient Name: Date of Service: CARREEN, MILIUS 07/03/2019 11:00 AM Medical Record [id number] Patient Account Number: [id number] Date of Birth/Sex: Treating RN: May 01, 1935 (84 y.o. [name] Primary Care Provider: [name] Other Clinician: Referring Provider: Treating Provider/Extender:Stone III, [name], [name] Weeks in Treatment: 6 Verbal / Phone Orders: No Diagnosis Coding ICD-10 Coding Code Description I87.2 Venous insufficiency (chronic) (peripheral) L97.822 Non-pressure chronic ulcer of other part of left lower leg with fat layer exposed S81.801A Unspecified open wound, right lower leg, initial encounter E11.622 Type 2 diabetes mellitus with other skin ulcer L97.512 Non-pressure chronic ulcer of other part of right foot with fat layer exposed I10 Essential (primary) hypertension I50.42 Chronic combined systolic (congestive) and diastolic (congestive) heart failure I25.10 Atherosclerotic heart disease of native coronary artery without angina pectoris L03.115 Cellulitis of right lower limb Follow-up Appointments Return Appointment in 1 week. Dressing Change Frequency Wound #2 Right,Medial Toe Great Change Dressing every other day. Skin Barriers/Peri-Wound Care Moisturizing lotion - to right leg Wound Cleansing Wound #2 Right,Medial Toe Great Clean wound with Wound Cleanser Primary Wound Dressing Wound #2 Right,Medial Toe Great Calcium Alginate with Silver Secondary Dressing Wound #2 Right,Medial Toe Great Foam - donut Kerlix/Rolled Gauze Dry Gauze Edema Control Avoid standing for long periods of time Elevate legs to the level of the heart or above for  30 minutes daily and/or when sitting, a frequency of: - whenever sitting throughout the  day Exercise regularly Support Garment 20-30 mm/Hg pressure to: - to both legs daily Off-Loading Other: - always wear shoes when walking even in the house to protect feet Laboratory Creatinine [Mass/volume] in Blood (CHEM) - I-STAT creatinine prior to MRI - (ICD10 L97.512 - Non- pressure chronic ulcer of other part of right foot with fat layer exposed) LOINC Code: 29244-6 Convenience Name: Creatine in blood -eGFR usually included Radiology MRI, lower extremity with/without contast right great toe - diabetic foot ulcer right great toe CPT - (ICD10 L97.512 - Non-pressure chronic ulcer of other part of right foot with fat layer exposed) Electronic Signature(s) Signed: 07/04/2019 12:37:21 PM By: Worthy Keeler PA-C Signed: 07/05/2019 6:14:01 PM By: [name] RN, BSN Entered By: [name] on 07/03/2019 16:11:39 -------------------------------------------------------------------------------- Prescription 07/03/2019 Patient Name: Tonya Hunter Provider: Worthy Keeler PA Date of Birth: 08/09/1934 NPI#: [id number] Sex: F DEA#: [id number] Phone #: [phone] License #: Patient Address: [address], [place] [address] [address] [place], [place] [address] [phone] Allergies penicillin Reaction: swelling Severity: Moderate Demerol Reaction: nausea/vomiting Severity: Moderate Provider's Orders MRI, lower extremity with/without contast right great toe - ICD10: L97.512 - diabetic foot ulcer right great toe CPT Signature(s): Date(s): Prescription 07/03/2019 Patient Name: Tonya Hunter Provider: Worthy Keeler PA Date of Birth: 1934/10/10 NPI#: [id number] Sex: F DEA#: [id number] Phone #: [phone] License #: Patient Address: [address], [place] [address] [address] [place],   [address] [phone] Allergies penicillin Reaction: swelling Severity: Moderate Demerol Reaction: nausea/vomiting Severity: Moderate Provider's Orders Creatinine [Mass/volume] in Blood - ICD10: L97.512 - I-STAT creatinine prior to MRI LOINC Code: 11735-6 Convenience Name: Creatine in blood -eGFR usually included Signature(s): Date(s): Electronic Signature(s) Signed: 07/04/2019 12:37:21 PM By: Worthy Keeler PA-C Signed: 07/05/2019 6:14:01 PM By: [name] RN, BSN Entered By: [name] on 07/03/2019 16:11:40 --------------------------------------------------------------------------------  Problem List Details Patient Name: Date of Service: Tonya Hunter. 07/03/2019 11:00 AM Medical Record [id number] Patient Account Number: [id number] Date of Birth/Sex: Treating RN: [birth year] (85 y.o. Martyn Malay, Vaughan Basta Primary Care Provider: [name] Other Clinician: Referring Provider: Treating Provider/Extender:Stone III, [name], Richard Janett Billow in Treatment: 6 Active Problems ICD-10 Evaluated Encounter Code Description Active Date Today Diagnosis I87.2 Venous insufficiency (chronic) (peripheral) 05/22/2019 No Yes L97.822 Non-pressure chronic ulcer of other part of left lower 05/22/2019 No Yes leg with fat layer exposed S81.801A Unspecified open wound, right lower leg, initial 06/05/2019 No Yes encounter E11.622 Type 2 diabetes mellitus with other skin ulcer 05/22/2019 No Yes L97.512 Non-pressure chronic ulcer of other part of right foot 05/22/2019 No Yes with fat layer exposed I10 Essential (primary) hypertension 05/22/2019 No Yes I50.42 Chronic combined systolic (congestive) and diastolic 12/21/5795 No Yes (congestive) heart failure I25.10 Atherosclerotic heart disease of native coronary artery 05/22/2019 No Yes without angina pectoris L03.115 Cellulitis of right lower limb 07/01/2019 No Yes Inactive Problems Resolved Problems Electronic Signature(s) Signed:  07/03/2019 11:37:08 AM By: Worthy Keeler PA-C Entered By: Worthy Keeler on 07/03/2019 11:37:07 -------------------------------------------------------------------------------- Progress Note Details Patient Name: Date of Service: Tonya Hunter. 07/03/2019 11:00 AM Medical Record [id number] Patient Account Number: [id number] Date of Birth/Sex: Treating RN: 07-18-1934 (85 y.o. [name] Primary Care Provider: [name] Other Clinician: Referring Provider: Treating  Provider/Extender:Stone III, [name], [name] Weeks in Treatment: 6 Subjective Chief Complaint Information obtained from Patient Right LE skin tear and right 1st toe ulcer History of Present Illness (HPI) 05/22/2019 patient is seen today in regard to her bilateral lower extremities this was a referral from her primary care provider they were concerned about areas of bruising and contusion that happened on a regular basis. With that being said the patient really has an issue with her right great toe where she has a significant blister that wraps around to the underside of the toe that has me more concerned to be perfectly honest than the legs themselves do they seem to be doing quite well. I explained in regard to the legs she is good to have ongoing and continued issues with blisters based on the fragility of her skin as long as she is on the Plavix and aspirin this is likely to be an ongoing issue. Honestly what she is going need to do is protect that area. In regard to the toe however that started 2 days ago and she tells me it seems to be getting worse each day. No fevers, chills, nausea, vomiting, or diarrhea. 05/29/2019 on evaluation today patient appears to be doing much better with regard to her leg which in fact appears to be completely healed. That is on the left. In regard to her right great toe this is showing signs of improvement I am very pleased with how things seem to be progressing  after we had removed the blistered skin down to an open wound last week. Overall I feel like there is new tissue growth and I am very excited that this in 1 week is doing so well. 06/05/2019 on evaluation today patient appears to be doing actually fairly well in regard to her toe ulcer. Unfortunately she does have a skin tear on the anterior surface of her right lower extremity which unfortunately is a little bit rolled under on the edge of the skin tear. Nonetheless I think we may be able to straighten this out and if so that would go a long way to help in this area to heal more effectively and quickly. Fortunately there is no signs of active infection at this time. She does have some swelling but again I think this is secondary to the skin tear as opposed to anything else. 06/12/2019 on evaluation today patient appears to be doing about the same in regard to the toe. With that being said she is having issues with the Steri-Strips having come off on her leg and there was some skin that actually necrosis and did slough off at this point. Fortunately there is no evidence of active infection at this time. She is very concerned about the possibility of amputation she states her daughter is also very concerned. With that being said I have not seen her daughter up to this point. Also have not really realized until today that the patient may have a component of dementia that may be preventing complete compliance and remembering of what needs to be done as far as taking care of her wound. 06/19/2019 on evaluation today patient appears to be doing well with regard to her lower extremity ulcer the toe ulcer is measuring better size wise but does have a little bit more depth. Again I do think we may want proceed with an x- ray at this point in order to further evaluate this and see if there is anything we need to do to  aid in getting this to heal more appropriately and quickly. Fortunately there is no signs of  systemic infection at this time. 06/26/2019 upon evaluation today patient actually appears to be doing quite well with regard to her lower extremity ulcer and her toe ulcer. I did prescribe her doxycycline in the interim since I last saw her last week and this week. She contacted the office on Friday and wanted to see about getting something at that point based on the results of the x-ray finding. There did not appear to be any signs of osteomyelitis but there was some evidence potentially of infection. Nonetheless I did send this in for her. 2/15; patient was worked in the clinic today after calling with severe pain in the right great toe. She had an x-ray done last week that was negative. She was put on doxycycline because of possible cellulitis. There is significant erythema in the dorsal aspect of the right great toe. The remaining wound is on the plantar aspect of the interphalangeal joint of the right great toe. Other wounds that were on the lower extremities seem healed. We have been using Iodoflex to the wound on the right great toe The patient has a penicillin allergy dating back into her early 52s at that point in time she developed swelling in the foot and ataxia of gait. She did not have a rash, anaphylactoid issues as far as I can tell and as far she remembers 07/03/19 upon evaluation today patient appears to be doing still poorly in regard to her great toe ulcer. Unfortunately she tells me that she is still having discomfort she did have to see Dr. [name] due to things not doing so well on the 15th which was just a couple of days ago. He did switcher at that time as far as the antibiotics were concerned to a different oral antibiotic. This was Keflex. With that being said she maybe does appear to be doing somewhat better though she does have a small abscess area in the toe I am still really feeling like there may be something greater going on here I think the patient may need to have  them ride to further evaluate the situation. Objective Constitutional Well-nourished and well-hydrated in no acute distress. Vitals Time Taken: 11:47 AM, Height: 66 in, Weight: 164 lbs, BMI: 26.5, Temperature: 97.7 F, Pulse: 67 bpm, Respiratory Rate: 19 breaths/min, Blood Pressure: 113/45 mmHg, Capillary Blood Glucose: 162 mg/dl. Respiratory normal breathing without difficulty. Psychiatric this patient is able to make decisions and demonstrates good insight into disease process. Alert and Oriented x 3. pleasant and cooperative. General Notes: Patient's wound bed currently showed signs of good granulation at this time. Fortunately there's no signs of active infection which is good news. No fevers, chills, nausea, or vomiting noted at this time. With that being said there does appear to be some evidence of infection including a small abscess in a couple of areas which connect to the main wound over the distal portion of the toe I did actually debride away the callous opening up these abscess pocket so that it could appropriately drain. Nonetheless I think the patient is gonna require an MRI to further evaluate this. Integumentary (Hair, Skin) Wound #2 status is Open. Original cause of wound was Blister. The wound is located on the [employer]. The wound measures 0.9cm length x 0.7cm width x 0.3cm depth; 0.495cm^2 area and 0.148cm^3 volume. There is Fat Layer (Subcutaneous Tissue) Exposed exposed. There is no tunneling noted,  however, there is undermining starting at 12:00 and ending at 12:00 with a maximum distance of 0.3cm. There is a medium amount of serosanguineous drainage noted. The wound margin is well defined and not attached to the wound base. There is medium (34-66%) pink granulation within the wound bed. There is a medium (34-66%) amount of necrotic tissue within the wound bed including Adherent Slough. Assessment Active Problems ICD-10 Venous insufficiency (chronic)  (peripheral) Non-pressure chronic ulcer of other part of left lower leg with fat layer exposed Unspecified open wound, right lower leg, initial encounter Type 2 diabetes mellitus with other skin ulcer Non-pressure chronic ulcer of other part of right foot with fat layer exposed Essential (primary) hypertension Chronic combined systolic (congestive) and diastolic (congestive) heart failure Atherosclerotic heart disease of native coronary artery without angina pectoris Cellulitis of right lower limb Procedures Wound #2 Pre-procedure diagnosis of Wound #2 is a Diabetic Wound/Ulcer of the Lower Extremity located on the Right,Medial Toe Great .Severity of Tissue Pre Debridement is: Fat layer exposed. There was a Excisional Skin/Subcutaneous Tissue Debridement with a total area of 5 sq cm performed by Worthy Keeler, PA. With the following instrument(s): Curette to remove Viable and Non-Viable tissue/material. Material removed includes Callus, Subcutaneous Tissue, Slough, and Skin: Epidermis after achieving pain control using Other (Benzocaine 20%). No specimens were taken. A time out was conducted at 12:35, prior to the start of the procedure. A Minimum amount of bleeding was controlled with Pressure. The procedure was tolerated well with a pain level of 2 throughout and a pain level of 0 following the procedure. Post Debridement Measurements: 1.3cm length x 1.6cm width x 0.3cm depth; 0.49cm^3 volume. Character of Wound/Ulcer Post Debridement is improved. Severity of Tissue Post Debridement is: Fat layer exposed. Post procedure Diagnosis Wound #2: Same as Pre-Procedure Plan Follow-up Appointments: Return Appointment in 1 week. Dressing Change Frequency: Wound #2 Right,Medial Toe Great: Change Dressing every other day. Skin Barriers/Peri-Wound Care: Moisturizing lotion - to right leg Wound Cleansing: Wound #2 Right,Medial Toe Great: Clean wound with Wound Cleanser Primary Wound  Dressing: Wound #2 Right,Medial Toe Great: Calcium Alginate with Silver Secondary Dressing: Wound #2 Right,Medial Toe Great: Foam - donut Kerlix/Rolled Gauze Dry Gauze Edema Control: Avoid standing for long periods of time Elevate legs to the level of the heart or above for 30 minutes daily and/or when sitting, a frequency of: - whenever sitting throughout the day Exercise regularly Support Garment 20-30 mm/Hg pressure to: - to both legs daily Off-Loading: Other: - always wear shoes when walking even in the house to protect feet Radiology ordered were: MRI, lower extremity with/without contast right great toe - diabetic foot ulcer right great toe CPT Laboratory ordered were: Creatine in blood -eGFR usually included - I-STAT creatinine prior to MRI 1. my suggestion at this time is gonna be that we go ahead and initiate treatment with continuation of the current anabiotic with Keflex that I think the patient seems to be doing well with this point to be honest. 2. I'm also gonna recommend that we continue currently with the silver out and a dressing that also seems to be beneficial at this point. 3. I am gonna recommend the patient a tent to limit walking as much as possible. Obviously I think pressure and friction as part of the issue with regard to her toe as well. 4. I am gonna go ahead and order an MRI for the patient in order to further evaluate for any evidence of osteomyelitis, septic arthritis,  or a more significant issue considering she doesn't seem to be doing too well here. Please see above for specific wound care orders. We will see patient for re-evaluation in 1 week(s) here in the clinic. If anything worsens or changes patient will contact our office for additional recommendations. Electronic Signature(s) Signed: 07/04/2019 12:37:21 PM By: Worthy Keeler PA-C Entered By: Worthy Keeler on 07/04/2019  12:36:34 -------------------------------------------------------------------------------- SuperBill Details Patient Name: Date of Service: Tonya Hunter 07/03/2019 Medical Record [id number] Patient Account Number: [id number] Date of Birth/Sex: Treating RN: 03/09/1935 (84 y.o. [name] Primary Care Provider: [name] Other Clinician: Referring Provider: Treating Provider/Extender:Stone III, [name], [name] Weeks in Treatment: 6 Diagnosis Coding ICD-10 Codes Code Description I87.2 Venous insufficiency (chronic) (peripheral) L97.822 Non-pressure chronic ulcer of other part of left lower leg with fat layer exposed S81.801A Unspecified open wound, right lower leg, initial encounter E11.622 Type 2 diabetes mellitus with other skin ulcer L97.512 Non-pressure chronic ulcer of other part of right foot with fat layer exposed I10 Essential (primary) hypertension I50.42 Chronic combined systolic (congestive) and diastolic (congestive) heart failure I25.10 Atherosclerotic heart disease of native coronary artery without angina pectoris L03.115 Cellulitis of right lower limb Facility Procedures CPT4 Code Description: [id number] 11042 - DEB SUBQ TISSUE 20 SQ CM/< ICD-10 Diagnosis Description L97.512 Non-pressure chronic ulcer of other part of right foot with Modifier: fat layer expo Quantity: 1 sed Physician Procedures CPT4 Code Description: 4709628 36629 - WC PHYS LEVEL 4 - EST PT ICD-10 Diagnosis Description I87.2 Venous insufficiency (chronic) (peripheral) L97.822 Non-pressure chronic ulcer of other part of left lower l S81.801A Unspecified open wound, right lower  leg, initial encount E11.622 Type 2 diabetes mellitus with other skin ulcer Modifier: 25 eg with fat laye er Quantity: 1 r exposed CPT4 Code Description: [id number] 03546 - WC PHYS SUBQ TISS 20 SQ CM ICD-10 Diagnosis Description L97.512 Non-pressure chronic ulcer of other part of right foot with  f Modifier: at layer exposed Quantity: 1 Electronic Signature(s) Signed: 07/04/2019 12:37:21 PM By: Worthy Keeler PA-C Entered By: Worthy Keeler on 07/04/2019 12:36:55

## 2019-07-05 ENCOUNTER — Ambulatory Visit: Payer: Medicare Other | Admitting: Physical Therapy

## 2019-07-05 ENCOUNTER — Other Ambulatory Visit: Payer: Self-pay | Admitting: Pharmacist

## 2019-07-05 MED ORDER — LISINOPRIL 2.5 MG PO TABS: 2.5000 mg | ORAL_TABLET | Freq: Every day | ORAL | 3 refills | Status: DC

## 2019-07-07 ENCOUNTER — Ambulatory Visit (HOSPITAL_COMMUNITY): Payer: Medicare Other

## 2019-07-08 ENCOUNTER — Ambulatory Visit (HOSPITAL_COMMUNITY)
Admission: RE | Admit: 2019-07-08 | Discharge: 2019-07-08 | Disposition: A | Discharge status: Home / Self Care | Payer: Medicare Other | Source: Ambulatory Visit | Attending: Physician Assistant | Admitting: Physician Assistant

## 2019-07-08 ENCOUNTER — Telehealth: Payer: Self-pay | Admitting: Physical Therapy

## 2019-07-08 ENCOUNTER — Ambulatory Visit: Payer: Medicare Other | Admitting: Physical Therapy

## 2019-07-08 ENCOUNTER — Other Ambulatory Visit: Payer: Self-pay

## 2019-07-08 DIAGNOSIS — L97519 Non-pressure chronic ulcer of other part of right foot with unspecified severity: Secondary | ICD-10-CM | POA: Diagnosis not present

## 2019-07-08 DIAGNOSIS — L97512 Non-pressure chronic ulcer of other part of right foot with fat layer exposed: Secondary | ICD-10-CM | POA: Insufficient documentation

## 2019-07-08 LAB — CREATININE, SERUM
Creatinine, Ser: 1.03 mg/dL — ABNORMAL HIGH (ref 0.44–1.00)
GFR calc Af Amer: 59 mL/min — ABNORMAL LOW (ref >60)
GFR calc non Af Amer: 51 mL/min — ABNORMAL LOW (ref >60)

## 2019-07-08 MED ORDER — GADOBUTROL 1 MMOL/ML IV SOLN
7.5000 mL | Freq: Once | INTRAVENOUS | Status: AC | PRN
  Administered 2019-07-08: 7.5 mL via INTRAVENOUS

## 2019-07-08 NOTE — Telephone Encounter (Signed)
Spoke with patient who reports she left a voicemail over the weekend letting us know she would not be able to make her appointment today.  Tonya Morell C. Tonya Hunter PT, DPT 07/08/19 4:11 PM
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
## 2019-07-09 ENCOUNTER — Encounter (HOSPITAL_BASED_OUTPATIENT_CLINIC_OR_DEPARTMENT_OTHER): Payer: Medicare Other | Admitting: Internal Medicine

## 2019-07-09 ENCOUNTER — Encounter (HOSPITAL_COMMUNITY): Payer: Self-pay | Admitting: *Deleted

## 2019-07-09 ENCOUNTER — Emergency Department (HOSPITAL_COMMUNITY)
Admission: EM | Admit: 2019-07-09 | Discharge: 2019-07-10 | Disposition: A | Discharge status: Home / Self Care | Payer: Medicare Other | Attending: Emergency Medicine | Admitting: Emergency Medicine

## 2019-07-09 ENCOUNTER — Other Ambulatory Visit: Payer: Self-pay

## 2019-07-09 DIAGNOSIS — Z95818 Presence of other cardiac implants and grafts: Secondary | ICD-10-CM | POA: Insufficient documentation

## 2019-07-09 DIAGNOSIS — Z7984 Long term (current) use of oral hypoglycemic drugs: Secondary | ICD-10-CM | POA: Insufficient documentation

## 2019-07-09 DIAGNOSIS — Z9049 Acquired absence of other specified parts of digestive tract: Secondary | ICD-10-CM | POA: Insufficient documentation

## 2019-07-09 DIAGNOSIS — Z96653 Presence of artificial knee joint, bilateral: Secondary | ICD-10-CM | POA: Diagnosis not present

## 2019-07-09 DIAGNOSIS — Z8673 Personal history of transient ischemic attack (TIA), and cerebral infarction without residual deficits: Secondary | ICD-10-CM | POA: Insufficient documentation

## 2019-07-09 DIAGNOSIS — E039 Hypothyroidism, unspecified: Secondary | ICD-10-CM | POA: Diagnosis not present

## 2019-07-09 DIAGNOSIS — I509 Heart failure, unspecified: Secondary | ICD-10-CM | POA: Diagnosis not present

## 2019-07-09 DIAGNOSIS — I2510 Atherosclerotic heart disease of native coronary artery without angina pectoris: Secondary | ICD-10-CM | POA: Insufficient documentation

## 2019-07-09 DIAGNOSIS — I110 Hypertensive heart disease with heart failure: Secondary | ICD-10-CM | POA: Insufficient documentation

## 2019-07-09 DIAGNOSIS — M86171 Other acute osteomyelitis, right ankle and foot: Secondary | ICD-10-CM | POA: Diagnosis not present

## 2019-07-09 DIAGNOSIS — Z4800 Encounter for change or removal of nonsurgical wound dressing: Secondary | ICD-10-CM | POA: Diagnosis not present

## 2019-07-09 DIAGNOSIS — L97519 Non-pressure chronic ulcer of other part of right foot with unspecified severity: Secondary | ICD-10-CM | POA: Diagnosis not present

## 2019-07-09 DIAGNOSIS — Z955 Presence of coronary angioplasty implant and graft: Secondary | ICD-10-CM | POA: Diagnosis not present

## 2019-07-09 DIAGNOSIS — I252 Old myocardial infarction: Secondary | ICD-10-CM | POA: Diagnosis not present

## 2019-07-09 DIAGNOSIS — R799 Abnormal finding of blood chemistry, unspecified: Secondary | ICD-10-CM | POA: Diagnosis present

## 2019-07-09 DIAGNOSIS — L03031 Cellulitis of right toe: Secondary | ICD-10-CM | POA: Diagnosis not present

## 2019-07-09 DIAGNOSIS — L539 Erythematous condition, unspecified: Secondary | ICD-10-CM | POA: Diagnosis not present

## 2019-07-09 DIAGNOSIS — Z85828 Personal history of other malignant neoplasm of skin: Secondary | ICD-10-CM | POA: Diagnosis not present

## 2019-07-09 DIAGNOSIS — L97512 Non-pressure chronic ulcer of other part of right foot with fat layer exposed: Secondary | ICD-10-CM | POA: Diagnosis not present

## 2019-07-09 DIAGNOSIS — Z7902 Long term (current) use of antithrombotics/antiplatelets: Secondary | ICD-10-CM | POA: Insufficient documentation

## 2019-07-09 DIAGNOSIS — Z5189 Encounter for other specified aftercare: Secondary | ICD-10-CM

## 2019-07-09 DIAGNOSIS — E11621 Type 2 diabetes mellitus with foot ulcer: Secondary | ICD-10-CM | POA: Insufficient documentation

## 2019-07-09 LAB — CBC WITH DIFFERENTIAL/PLATELET
Abs Immature Granulocytes: 0.01 10*3/uL (ref 0.00–0.07)
Basophils Absolute: 0 10*3/uL (ref 0.0–0.1)
Basophils Relative: 1 %
Eosinophils Absolute: 0.1 10*3/uL (ref 0.0–0.5)
Eosinophils Relative: 3 %
HCT: 36.1 % (ref 36.0–46.0)
Hemoglobin: 11.9 g/dL — ABNORMAL LOW (ref 12.0–15.0)
Immature Granulocytes: 0 %
Lymphocytes Relative: 23 %
Lymphs Abs: 0.8 10*3/uL (ref 0.7–4.0)
MCH: 30.7 pg (ref 26.0–34.0)
MCHC: 33 g/dL (ref 30.0–36.0)
MCV: 93.3 fL (ref 80.0–100.0)
Monocytes Absolute: 0.4 10*3/uL (ref 0.1–1.0)
Monocytes Relative: 11 %
Neutro Abs: 2.2 10*3/uL (ref 1.7–7.7)
Neutrophils Relative %: 62 %
Platelets: 165 10*3/uL (ref 150–400)
RBC: 3.87 MIL/uL (ref 3.87–5.11)
RDW: 14.6 % (ref 11.5–15.5)
WBC: 3.5 10*3/uL — ABNORMAL LOW (ref 4.0–10.5)
nRBC: 0 % (ref 0.0–0.2)

## 2019-07-09 NOTE — ED Triage Notes (Signed)
Pt very anxious in triage after being send by PCP due to abnormal labs
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
## 2019-07-09 NOTE — Progress Notes (Signed)
Tonya Hunter, Tonya Hunter ([id number]) Visit Report for 07/09/2019 HPI Details Patient Name: Date of Service: Tonya Hunter, Tonya Hunter 07/09/2019 3:45 PM Medical Record [id number] Patient Account Number: [id number] Date of Birth/Sex: Treating RN: 02/19/1935 (85 y.o. [name] Primary Care Provider: [name] Other Clinician: Referring Provider: Treating Provider/Extender:Lakeria Starkman, [name], [name] in Treatment: 6 History of Present Illness HPI Description: 05/22/2019 patient is seen today in regard to her bilateral lower extremities this was a referral from her primary care provider they were concerned about areas of bruising and contusion that happened on a regular basis. With that being said the patient really has an issue with her right great toe where she has a significant blister that wraps around to the underside of the toe that has me more concerned to be perfectly honest than the legs themselves do they seem to be doing quite well. I explained in regard to the legs she is good to have ongoing and continued issues with blisters based on the fragility of her skin as long as she is on the Plavix and aspirin this is likely to be an ongoing issue. Honestly what she is going need to do is protect that area. In regard to the toe however that started 2 days ago and she tells me it seems to be getting worse each day. No fevers, chills, nausea, vomiting, or diarrhea. 05/29/2019 on evaluation today patient appears to be doing much better with regard to her leg which in fact appears to be completely healed. That is on the left. In regard to her right great toe this is showing signs of improvement I am very pleased with how things seem to be progressing after we had removed the blistered skin down to an open wound last week. Overall I feel like there is new tissue growth and I am very excited that this in 1 week is doing so well. 06/05/2019 on evaluation today patient appears to be  doing actually fairly well in regard to her toe ulcer. Unfortunately she does have a skin tear on the anterior surface of her right lower extremity which unfortunately is a little bit rolled under on the edge of the skin tear. Nonetheless I think we may be able to straighten this out and if so that would go a long way to help in this area to heal more effectively and quickly. Fortunately there is no signs of active infection at this time. She does have some swelling but again I think this is secondary to the skin tear as opposed to anything else. 06/12/2019 on evaluation today patient appears to be doing about the same in regard to the toe. With that being said she is having issues with the Steri-Strips having come off on her leg and there was some skin that actually necrosis and did slough off at this point. Fortunately there is no evidence of active infection at this time. She is very concerned about the possibility of amputation she states her daughter is also very concerned. With that being said I have not seen her daughter up to this point. Also have not really realized until today that the patient may have a component of dementia that may be preventing complete compliance and remembering of what needs to be done as far as taking care of her wound. 06/19/2019 on evaluation today patient appears to be doing well with regard to her lower extremity ulcer the toe ulcer is measuring better size wise but does have a little bit  more depth. Again I do think we may want proceed with an x- ray at this point in order to further evaluate this and see if there is anything we need to do to aid in getting this to heal more appropriately and quickly. Fortunately there is no signs of systemic infection at this time. 06/26/2019 upon evaluation today patient actually appears to be doing quite well with regard to her lower extremity ulcer and her toe ulcer. I did prescribe her doxycycline in the interim since I last  saw her last week and this week. She contacted the office on Friday and wanted to see about getting something at that point based on the results of the x-ray finding. There did not appear to be any signs of osteomyelitis but there was some evidence potentially of infection. Nonetheless I did send this in for her. 2/15; patient was worked in the clinic today after calling with severe pain in the right great toe. She had an x-ray done last week that was negative. She was put on doxycycline because of possible cellulitis. There is significant erythema in the dorsal aspect of the right great toe. The remaining wound is on the plantar aspect of the interphalangeal joint of the right great toe. Other wounds that were on the lower extremities seem healed. We have been using Iodoflex to the wound on the right great toe The patient has a penicillin allergy dating back into her early 71s at that point in time she developed swelling in the foot and ataxia of gait. She did not have a rash, anaphylactoid issues as far as I can tell and as far she remembers 07/03/19 upon evaluation today patient appears to be doing still poorly in regard to her great toe ulcer. Unfortunately she tells me that she is still having discomfort she did have to see Dr. [name] due to things not doing so well on the 15th which was just a couple of days ago. He did switcher at that time as far as the antibiotics were concerned to a different oral antibiotic. This was Keflex. With that being said she maybe does appear to be doing somewhat better though she does have a small abscess area in the toe I am still really feeling like there may be something greater going on here I think the patient may need to have them ride to further evaluate the situation. 2/23; patient seems to alternate clinics on a weekly basis. I note that she had an MRI showing a soft tissue ulceration of the medial plantar base of the great toe with underlying early  osteomyelitis of the first distal phalanx. I gave her Keflex a week or so ago for what looked to be cellulitis of the right great toe resistant to doxycycline and this looks a lot better. We have been using silver alginate. Electronic Signature(s) Signed: 07/09/2019 6:00:34 PM By: Linton Ham MD Entered By: Linton Ham on 07/09/2019 17:51:56 -------------------------------------------------------------------------------- Physical Exam Details Patient Name: Date of Service: Tonya Hunter 07/09/2019 3:45 PM Medical Record [id number] Patient Account Number: [id number] Date of Birth/Sex: Treating RN: 1934/08/05 (85 y.o. [name] Primary Care Provider: [name] Other Clinician: Referring Provider: Treating Provider/Extender:Marquetta Weiskopf, [name], [name] in Treatment: 6 Constitutional Patient is hypertensive.. Pulse regular and within target range for patient.Marland Kitchen Respirations regular, non-labored and within target range.. Temperature is normal and within the target range for the patient.Marland Kitchen Appears in no distress. Cardiovascular Pedal pulses are palpable. Integumentary (Hair, Skin) The erythema  and swelling of the toe appears better. Notes Wound exam; the patient appears to have a healthy wound base although there is rolled edges around the wound. The cellulitis that I saw on the dorsal toe a week ago look a lot better. Electronic Signature(s) Signed: 07/09/2019 6:00:34 PM By: Linton Ham MD Entered By: Linton Ham on 07/09/2019 17:53:22 -------------------------------------------------------------------------------- Physician Orders Details Patient Name: Date of Service: Tonya Hunter 07/09/2019 3:45 PM Medical Record [id number] Patient Account Number: [id number] Date of Birth/Sex: Treating RN: [birth year] (85 y.o. [name] Primary Care Provider: [name] Other Clinician: Referring Provider: Treating  Provider/Extender:Madysen Faircloth, [name], [name] in Treatment: 6 Verbal / Phone Orders: No Diagnosis Coding ICD-10 Coding Code Description I87.2 Venous insufficiency (chronic) (peripheral) L97.822 Non-pressure chronic ulcer of other part of left lower leg with fat layer exposed S81.801A Unspecified open wound, right lower leg, initial encounter E11.622 Type 2 diabetes mellitus with other skin ulcer L97.512 Non-pressure chronic ulcer of other part of right foot with fat layer exposed I10 Essential (primary) hypertension I50.42 Chronic combined systolic (congestive) and diastolic (congestive) heart failure I25.10 Atherosclerotic heart disease of native coronary artery without angina pectoris L03.115 Cellulitis of right lower limb Follow-up Appointments Return Appointment in 1 week. Dressing Change Frequency Wound #2 Right,Medial Toe Great Change Dressing every other day. Skin Barriers/Peri-Wound Care Moisturizing lotion - to right leg Wound Cleansing Wound #2 Right,Medial Toe Great Clean wound with Wound Cleanser Primary Wound Dressing Wound #2 Right,Medial Toe Great Calcium Alginate with Silver Secondary Dressing Wound #2 Right,Medial Toe Great Foam - donut Kerlix/Rolled Gauze Dry Gauze Edema Control Avoid standing for long periods of time Elevate legs to the level of the heart or above for 30 minutes daily and/or when sitting, a frequency of: - whenever sitting throughout the day Exercise regularly Support Garment 20-30 mm/Hg pressure to: - to both legs daily Off-Loading Open toe surgical shoe to: - RIGHT FOOT Laboratory CBC W Auto Differential panel in Blood (HEM-CBC) - (ICD10 J82.505 - Non-pressure chronic ulcer of other part of left lower leg with fat layer exposed) LOINC Code: 39767-3 Convenience Name: CBC W Auto Differential panel Basic metabolic 4193 panel in Serum or Plasma (CHEM-panel) - (ICD10 L97.822 - Non-pressure chronic ulcer of other part of  left lower leg with fat layer exposed) LOINC Code: 79024-0 Convenience Name: Basic Metabolic panelCMS Erythrocyte sedimentation rate (HEM) - (ICD10 L97.822 - Non-pressure chronic ulcer of other part of left lower leg with fat layer exposed) LOINC Code: 97353-2 Convenience Name: Sed rate-method unspecified C reactive protein [Mass/volume] in Serum or Plasma (CHEM) - (ICD10 L97.822 - Non-pressure chronic ulcer of other part of left lower leg with fat layer exposed) LOINC Code: 1988-5 Convenience Name: C Reactive Protein in serum or plasma Patient Medications Allergies: penicillin, Demerol Notifications Medication Indication Start End Levaquin osteomyelitis 07/09/2019 right great toe DOSE oral 500 mg tablet - 1 tablet oral daily for 2 weeks (d/c Keflex Electronic Signature(s) Signed: 07/09/2019 5:56:17 PM By: Linton Ham MD Entered By: Linton Ham on 07/09/2019 17:56:17 -------------------------------------------------------------------------------- Prescription 07/09/2019 Patient Name: Tonya Hunter Provider: Linton Ham MD Date of Birth: July 14, 1934 NPI#: [id number] Sex: F DEA#: [id number] Phone #: [phone] License #: [id number] Patient Address: [address], [place] [address] [address] [place],  [address] [phone] Allergies penicillin Reaction: swelling Severity: Moderate Demerol Reaction: nausea/vomiting Severity: Moderate Provider's Orders CBC W Auto Differential panel in Blood - ICD10: X41.287  LOINC Code: [phone] Convenience Name: CBC W Auto Differential panel Signature(s): Date(s): Prescription 07/09/2019 Patient Name: Tonya Hunter Provider: Linton Ham MD Date of Birth: [birth year] NPI#: [id number] Sex: F DEA#: [id number] Phone #: [phone] License #: [id number] Patient Address: [address], [place] [address] [place], [place] [address] [phone] Allergies penicillin Reaction: swelling Severity: Moderate Demerol Reaction: nausea/vomiting Severity: Moderate Provider's Orders Basic metabolic 0300 panel in Serum or Plasma - ICD10: L97.822 LOINC Code: 24321-2 Convenience Name: Basic Metabolic panelCMS Signature(s): Date(s): Prescription 07/09/2019 Patient Name: Tonya Hunter Provider: Linton Ham MD Date of Birth: 1935/01/08 NPI#: [id number] Sex: F DEA#: [id number] Phone #: [phone] License #: [id number] Patient Address: [address] [place], [place] [address] [place], [place] [address] [phone] Allergies penicillin Reaction: swelling Severity: Moderate Demerol Reaction: nausea/vomiting Severity: Moderate Provider's Orders Erythrocyte sedimentation rate - ICD10: W80.321 LOINC Code: 22482-5 Convenience Name: Sed rate-method unspecified Signature(s): Date(s): Prescription 07/09/2019 Patient Name: Tonya Hunter Provider: Linton Ham MD Date of Birth: 02-21-1935 NPI#: [id number] Sex: F DEA#: [id number] Phone #: [phone] License #: [id number] Patient Address: [address] [place], [place] [address] [address] [place], [place] [address] [phone] Allergies penicillin Reaction: swelling Severity: Moderate Demerol Reaction: nausea/vomiting Severity: Moderate Provider's Orders C reactive protein [Mass/volume] in Serum or Plasma - ICD10: J44.920 LOINC Code: 1988-5 Convenience Name: C Reactive Protein in serum or plasma Signature(s): Date(s): Electronic Signature(s) Signed: 07/09/2019 6:00:34 PM By: Linton Ham MD Entered By: Linton Ham on 07/09/2019 17:56:18 --------------------------------------------------------------------------------  Problem List  Details Patient Name: Date of Service: Tonya Hunter 07/09/2019 3:45 PM Medical Record [id number] Patient Account Number: [id number] Date of Birth/Sex: Treating RN: February 17, 1935 (85 y.o. [name] Primary Care Provider: [name] Other Clinician: Referring Provider: Treating Provider/Extender:Shameika Speelman, [name], [name] in Treatment: 6 Active Problems ICD-10 Evaluated Encounter Code Description Active Date Today Diagnosis I87.2 Venous insufficiency (chronic) (peripheral) 05/22/2019 No Yes L97.822 Non-pressure chronic ulcer of other part of left lower 05/22/2019 No Yes leg with fat layer exposed S81.801A Unspecified open wound, right lower leg, initial 06/05/2019 No Yes encounter E11.622 Type 2 diabetes mellitus with other skin ulcer 05/22/2019 No Yes L97.512 Non-pressure chronic ulcer of other part of right foot 05/22/2019 No Yes with fat layer exposed M86.671 Other chronic osteomyelitis, right ankle and foot 07/09/2019 No Yes I10 Essential (primary) hypertension 05/22/2019 No Yes I50.42 Chronic combined systolic (congestive) and diastolic 08/23/8262 No Yes (congestive) heart failure I25.10 Atherosclerotic heart disease of native coronary artery 05/22/2019 No Yes without angina pectoris Inactive Problems ICD-10 Code Description Active Date Inactive Date L03.115 Cellulitis of right lower limb 07/01/2019 07/01/2019 Resolved Problems Electronic Signature(s) Signed: 07/09/2019 6:00:34 PM By: Linton Ham MD Entered By: Linton Ham on 07/09/2019 17:50:57 -------------------------------------------------------------------------------- Progress Note Details Patient Name: Date of Service: Tonya Hunter 07/09/2019 3:45 PM Medical Record [id number] Patient Account Number: [id number] Date of Birth/Sex: Treating RN: Apr 22, 1935 (85 y.o. [name] Primary Care Provider: [name] Other Clinician: Referring Provider: Treating  Provider/Extender:Ivee Poellnitz, [name], [name] in Treatment: 6 Subjective History of Present Illness (HPI) 05/22/2019 patient is seen today in regard to her bilateral lower extremities this was a referral from her primary care provider they were concerned about areas of bruising and contusion that happened on  a regular basis. With that being said the patient really has an issue with her right great toe where she has a significant blister that wraps around to the underside of the toe that has me more concerned to be perfectly honest than the legs themselves do they seem to be doing quite well. I explained in regard to the legs she is good to have ongoing and continued issues with blisters based on the fragility of her skin as long as she is on the Plavix and aspirin this is likely to be an ongoing issue. Honestly what she is going need to do is protect that area. In regard to the toe however that started 2 days ago and she tells me it seems to be getting worse each day. No fevers, chills, nausea, vomiting, or diarrhea. 05/29/2019 on evaluation today patient appears to be doing much better with regard to her leg which in fact appears to be completely healed. That is on the left. In regard to her right great toe this is showing signs of improvement I am very pleased with how things seem to be progressing after we had removed the blistered skin down to an open wound last week. Overall I feel like there is new tissue growth and I am very excited that this in 1 week is doing so well. 06/05/2019 on evaluation today patient appears to be doing actually fairly well in regard to her toe ulcer. Unfortunately she does have a skin tear on the anterior surface of her right lower extremity which unfortunately is a little bit rolled under on the edge of the skin tear. Nonetheless I think we may be able to straighten this out and if so that would go a long way to help in this area to heal more  effectively and quickly. Fortunately there is no signs of active infection at this time. She does have some swelling but again I think this is secondary to the skin tear as opposed to anything else. 06/12/2019 on evaluation today patient appears to be doing about the same in regard to the toe. With that being said she is having issues with the Steri-Strips having come off on her leg and there was some skin that actually necrosis and did slough off at this point. Fortunately there is no evidence of active infection at this time. She is very concerned about the possibility of amputation she states her daughter is also very concerned. With that being said I have not seen her daughter up to this point. Also have not really realized until today that the patient may have a component of dementia that may be preventing complete compliance and remembering of what needs to be done as far as taking care of her wound. 06/19/2019 on evaluation today patient appears to be doing well with regard to her lower extremity ulcer the toe ulcer is measuring better size wise but does have a little bit more depth. Again I do think we may want proceed with an x- ray at this point in order to further evaluate this and see if there is anything we need to do to aid in getting this to heal more appropriately and quickly. Fortunately there is no signs of systemic infection at this time. 06/26/2019 upon evaluation today patient actually appears to be doing quite well with regard to her lower extremity ulcer and her toe ulcer. I did prescribe her doxycycline in the interim since I last saw her last week and this week. She contacted  the office on Friday and wanted to see about getting something at that point based on the results of the x-ray finding. There did not appear to be any signs of osteomyelitis but there was some evidence potentially of infection. Nonetheless I did send this in for her. 2/15; patient was worked in the clinic  today after calling with severe pain in the right great toe. She had an x-ray done last week that was negative. She was put on doxycycline because of possible cellulitis. There is significant erythema in the dorsal aspect of the right great toe. The remaining wound is on the plantar aspect of the interphalangeal joint of the right great toe. Other wounds that were on the lower extremities seem healed. We have been using Iodoflex to the wound on the right great toe The patient has a penicillin allergy dating back into her early 25s at that point in time she developed swelling in the foot and ataxia of gait. She did not have a rash, anaphylactoid issues as far as I can tell and as far she remembers 07/03/19 upon evaluation today patient appears to be doing still poorly in regard to her great toe ulcer. Unfortunately she tells me that she is still having discomfort she did have to see Dr. [name] due to things not doing so well on the 15th which was just a couple of days ago. He did switcher at that time as far as the antibiotics were concerned to a different oral antibiotic. This was Keflex. With that being said she maybe does appear to be doing somewhat better though she does have a small abscess area in the toe I am still really feeling like there may be something greater going on here I think the patient may need to have them ride to further evaluate the situation. 2/23; patient seems to alternate clinics on a weekly basis. I note that she had an MRI showing a soft tissue ulceration of the medial plantar base of the great toe with underlying early osteomyelitis of the first distal phalanx. I gave her Keflex a week or so ago for what looked to be cellulitis of the right great toe resistant to doxycycline and this looks a lot better. We have been using silver alginate. Objective Constitutional Patient is hypertensive.. Pulse regular and within target range for patient.Marland Kitchen Respirations regular,  non-labored and within target range.. Temperature is normal and within the target range for the patient.Marland Kitchen Appears in no distress. Vitals Time Taken: 4:55 PM, Height: 66 in, Source: Stated, Weight: 164 lbs, Source: Stated, BMI: 26.5, Temperature: 97.7 F, Pulse: 70 bpm, Respiratory Rate: 18 breaths/min, Blood Pressure: 151/86 mmHg, Capillary Blood Glucose: 162 mg/dl. General Notes: glucose per pt report last night Cardiovascular Pedal pulses are palpable. General Notes: Wound exam; the patient appears to have a healthy wound base although there is rolled edges around the wound. The cellulitis that I saw on the dorsal toe a week ago look a lot better. Integumentary (Hair, Skin) The erythema and swelling of the toe appears better. Wound #2 status is Open. Original cause of wound was Blister. The wound is located on the [employer]. The wound measures 0.9cm length x 0.9cm width x 0.3cm depth; 0.636cm^2 area and 0.191cm^3 volume. There is Fat Layer (Subcutaneous Tissue) Exposed exposed. There is no tunneling noted, however, there is undermining starting at 11:00 and ending at 5:00 with a maximum distance of 0.3cm. There is a medium amount of serosanguineous drainage noted. The wound margin is  thickened. There is small (1-33%) pink, friable granulation within the wound bed. There is a large (67-100%) amount of necrotic tissue within the wound bed including Adherent Slough. Assessment Active Problems ICD-10 Venous insufficiency (chronic) (peripheral) Non-pressure chronic ulcer of other part of left lower leg with fat layer exposed Unspecified open wound, right lower leg, initial encounter Type 2 diabetes mellitus with other skin ulcer Non-pressure chronic ulcer of other part of right foot with fat layer exposed Other chronic osteomyelitis, right ankle and foot Essential (primary) hypertension Chronic combined systolic (congestive) and diastolic (congestive) heart  failure Atherosclerotic heart disease of native coronary artery without angina pectoris Plan Follow-up Appointments: Return Appointment in 1 week. Dressing Change Frequency: Wound #2 Right,Medial Toe Great: Change Dressing every other day. Skin Barriers/Peri-Wound Care: Moisturizing lotion - to right leg Wound Cleansing: Wound #2 Right,Medial Toe Great: Clean wound with Wound Cleanser Primary Wound Dressing: Wound #2 Right,Medial Toe Great: Calcium Alginate with Silver Secondary Dressing: Wound #2 Right,Medial Toe Great: Foam - donut Kerlix/Rolled Gauze Dry Gauze Edema Control: Avoid standing for long periods of time Elevate legs to the level of the heart or above for 30 minutes daily and/or when sitting, a frequency of: - whenever sitting throughout the day Exercise regularly Support Garment 20-30 mm/Hg pressure to: - to both legs daily Off-Loading: Open toe surgical shoe to: - RIGHT FOOT Laboratory ordered were: CBC W Auto Differential panel, Basic Metabolic panel CMS, Sed rate -method unspecified, C Reactive Protein in serum or plasma The following medication(s) was prescribed: Levaquin oral 500 mg tablet 1 tablet oral daily for 2 weeks (d/c Keflex for osteomyelitis right great toe starting 07/09/2019 1. I change the patient's antibiotic to Levaquin 500 a day that has both good bioavailability and excellent bone penetration 2. There is no reason to believe that she has an MRSA infection. There were no cultures available for guidance 3. She has not really offloading this toe I gave her a surgical shoe with felt offloading 4. Lab work including a BMP CBC with differential sedimentation rate and C-reactive protein 5. She will need to be carefully monitored for side effects of Levaquin which include diarrhea, neuropathy tendinitis. 6. I think being this is an early infection involving the distal phalanx I think we can take a trial of oral antibiotic therapy in our clinic rather  than infectious disease. If she deteriorates then we will need to rethink this 7. I told her I at that this could be a total threatening problem and to monitor carefully for any spread of the infection out of the wound area Electronic Signature(s) Signed: 07/09/2019 6:00:34 PM By: Linton Ham MD Entered By: Linton Ham on 07/09/2019 17:58:46 -------------------------------------------------------------------------------- SuperBill Details Patient Name: Date of Service: Tonya Hunter 07/09/2019 Medical Record [id number] Patient Account Number: [id number] Date of Birth/Sex: Treating RN: 1935-04-05 (85 y.o. [name] Primary Care Provider: [name] Other Clinician: Referring Provider: Treating Provider/Extender:Ardelia Wrede, [name], [name] in Treatment: 6 Diagnosis Coding ICD-10 Codes Code Description I87.2 Venous insufficiency (chronic) (peripheral) L97.822 Non-pressure chronic ulcer of other part of left lower leg with fat layer exposed S81.801A Unspecified open wound, right lower leg, initial encounter E11.622 Type 2 diabetes mellitus with other skin ulcer L97.512 Non-pressure chronic ulcer of other part of right foot with fat layer exposed M86.671 Other chronic osteomyelitis, right ankle and foot I10 Essential (primary) hypertension I50.42 Chronic combined systolic (congestive) and diastolic (congestive) heart failure I25.10 Atherosclerotic heart disease of native coronary artery without angina  pectoris Facility Procedures CPT4 Code: 44010272 Description: 53664 - WOUND CARE VISIT-LEV 3 EST PT Modifier: Quantity: 1 Physician Procedures CPT4 Code Description: 4034742 99214 - WC PHYS LEVEL 4 - EST PT ICD-10 Diagnosis Description M86.671 Other chronic osteomyelitis, right ankle and foot E11.622 Type 2 diabetes mellitus with other skin ulcer L97.512 Non-pressure chronic ulcer of other part  of right foot w Modifier: ith fat layer  exp Quantity: 1 osed Electronic Signature(s) Signed: 07/09/2019 6:00:34 PM By: Linton Ham MD Entered By: Linton Ham on 07/09/2019 17:59:28

## 2019-07-10 ENCOUNTER — Ambulatory Visit: Payer: Medicare Other | Admitting: Cardiovascular Disease

## 2019-07-10 LAB — SEDIMENTATION RATE: Sed Rate: 32 mm/hr — ABNORMAL HIGH (ref 0–22)

## 2019-07-10 LAB — BASIC METABOLIC PANEL
Anion gap: 9 (ref 5–15)
BUN: 20 mg/dL (ref 8–23)
CO2: 25 mmol/L (ref 22–32)
Calcium: 8.8 mg/dL — ABNORMAL LOW (ref 8.9–10.3)
Chloride: 105 mmol/L (ref 98–111)
Creatinine, Ser: 0.69 mg/dL (ref 0.44–1.00)
GFR calc Af Amer: >60 mL/min (ref >60)
GFR calc non Af Amer: >60 mL/min (ref >60)
Glucose, Bld: 203 mg/dL — ABNORMAL HIGH (ref 70–99)
Potassium: 3.7 mmol/L (ref 3.5–5.1)
Sodium: 139 mmol/L (ref 135–145)

## 2019-07-10 LAB — C-REACTIVE PROTEIN: CRP: 0.6 mg/dL (ref <1.0)

## 2019-07-10 NOTE — ED Notes (Signed)
Topaz signature pad unavailable to patient in hallway. Patient verbalized understanding of discharge instructions.

## 2019-07-10 NOTE — Discharge Instructions (Addendum)
You were evaluated in the Emergency Department and after careful evaluation, we did not find any emergent condition requiring admission or further testing in the hospital.  Your exam/testing today is overall reassuring.  Please return to the Emergency Department if you experience any worsening of your condition.  We encourage you to follow up with a primary care provider.  Thank you for allowing [name] to be a part of your care.

## 2019-07-16 ENCOUNTER — Other Ambulatory Visit: Payer: Self-pay

## 2019-07-16 ENCOUNTER — Encounter (HOSPITAL_BASED_OUTPATIENT_CLINIC_OR_DEPARTMENT_OTHER): Payer: Medicare Other | Attending: Internal Medicine | Admitting: Internal Medicine

## 2019-07-16 DIAGNOSIS — E11621 Type 2 diabetes mellitus with foot ulcer: Secondary | ICD-10-CM | POA: Diagnosis not present

## 2019-07-16 DIAGNOSIS — I2510 Atherosclerotic heart disease of native coronary artery without angina pectoris: Secondary | ICD-10-CM | POA: Diagnosis not present

## 2019-07-16 DIAGNOSIS — E11622 Type 2 diabetes mellitus with other skin ulcer: Secondary | ICD-10-CM | POA: Insufficient documentation

## 2019-07-16 DIAGNOSIS — I872 Venous insufficiency (chronic) (peripheral): Secondary | ICD-10-CM | POA: Diagnosis not present

## 2019-07-16 DIAGNOSIS — I5042 Chronic combined systolic (congestive) and diastolic (congestive) heart failure: Secondary | ICD-10-CM | POA: Diagnosis not present

## 2019-07-16 DIAGNOSIS — L97512 Non-pressure chronic ulcer of other part of right foot with fat layer exposed: Secondary | ICD-10-CM | POA: Diagnosis not present

## 2019-07-16 DIAGNOSIS — Z7982 Long term (current) use of aspirin: Secondary | ICD-10-CM | POA: Diagnosis not present

## 2019-07-16 DIAGNOSIS — Z7902 Long term (current) use of antithrombotics/antiplatelets: Secondary | ICD-10-CM | POA: Insufficient documentation

## 2019-07-16 DIAGNOSIS — M86671 Other chronic osteomyelitis, right ankle and foot: Secondary | ICD-10-CM | POA: Diagnosis not present

## 2019-07-16 DIAGNOSIS — I110 Hypertensive heart disease with heart failure: Secondary | ICD-10-CM | POA: Diagnosis not present

## 2019-07-16 DIAGNOSIS — Z79899 Other long term (current) drug therapy: Secondary | ICD-10-CM | POA: Diagnosis not present

## 2019-07-17 DIAGNOSIS — M21372 Foot drop, left foot: Secondary | ICD-10-CM | POA: Diagnosis not present

## 2019-07-17 DIAGNOSIS — Z0000 Encounter for general adult medical examination without abnormal findings: Secondary | ICD-10-CM | POA: Diagnosis not present

## 2019-07-17 DIAGNOSIS — E119 Type 2 diabetes mellitus without complications: Secondary | ICD-10-CM | POA: Diagnosis not present

## 2019-07-17 DIAGNOSIS — E782 Mixed hyperlipidemia: Secondary | ICD-10-CM | POA: Diagnosis not present

## 2019-07-17 DIAGNOSIS — Z1339 Encounter for screening examination for other mental health and behavioral disorders: Secondary | ICD-10-CM | POA: Diagnosis not present

## 2019-07-17 DIAGNOSIS — N1831 Chronic kidney disease, stage 3a: Secondary | ICD-10-CM | POA: Diagnosis not present

## 2019-07-17 DIAGNOSIS — I129 Hypertensive chronic kidney disease with stage 1 through stage 4 chronic kidney disease, or unspecified chronic kidney disease: Secondary | ICD-10-CM | POA: Diagnosis not present

## 2019-07-17 DIAGNOSIS — Z1331 Encounter for screening for depression: Secondary | ICD-10-CM | POA: Diagnosis not present

## 2019-07-17 DIAGNOSIS — L089 Local infection of the skin and subcutaneous tissue, unspecified: Secondary | ICD-10-CM | POA: Diagnosis not present

## 2019-07-17 DIAGNOSIS — I639 Cerebral infarction, unspecified: Secondary | ICD-10-CM | POA: Diagnosis not present

## 2019-07-17 DIAGNOSIS — E559 Vitamin D deficiency, unspecified: Secondary | ICD-10-CM | POA: Diagnosis not present

## 2019-07-17 DIAGNOSIS — Z9861 Coronary angioplasty status: Secondary | ICD-10-CM | POA: Diagnosis not present

## 2019-07-17 LAB — CUP PACEART REMOTE DEVICE CHECK
Date Time Interrogation Session: 20210303015916
Implantable Pulse Generator Implant Date: 20200615

## 2019-07-17 NOTE — Progress Notes (Signed)
Tonya Hunter, Tonya Hunter ([id number]) Visit Report for 07/16/2019 Arrival Information Details Patient Name: Date of Service: Tonya Hunter, Tonya Hunter 07/16/2019 9:45 AM Medical Record [id number] Patient Account Number: [id number] Date of Birth/Sex: Treating RN: Jan 25, 1935 (85 y.o. Tonya Hunter Primary Care Carely Nappier: [name] Other Clinician: Referring Kameelah Minish: Treating Lawerence Dery/Extender:[name], [name], [name] in Treatment: 7 Visit Information History Since Last Visit All ordered tests and consults Yes Patient Arrived: Tonya Hunter were completed: Arrival Time: 10:03 Added or deleted any No Accompanied By: self medications: Transfer Assistance: None Any new allergies or adverse No Patient Identification Verified: Yes reactions: Secondary Verification Process Yes Had a fall or experienced change No Completed: in Patient Requires Transmission- No activities of daily living that may Based Precautions: affect Patient Has Alerts: Yes risk of falls: Patient Alerts: Patient on Blood Signs or symptoms of No Thinner abuse/neglect since last visito L ABI non Hospitalized since last visit: No compressible Implantable device outside of the No clinic excluding cellular tissue based products placed in the center since last visit: Has Dressing in Place as Yes Prescribed: Has Footwear/Offloading in Place Yes as Prescribed: Right: Surgical Shoe with Pressure Relief Insole Pain Present Now: No Electronic Signature(s) Signed: 07/16/2019 4:52:50 PM By: [name] RN, BSN Entered By: [name] on 07/16/2019 10:07:28 -------------------------------------------------------------------------------- Clinic Level of Care Assessment Details Patient Name: Date of Service: Tonya Hunter, Tonya Hunter 07/16/2019 9:45 AM Medical Record [id number] Patient Account Number: [id number] Date of Birth/Sex: Treating RN: Feb 04, 1935 (85 y.o. Tonya Hunter Primary Care Vermelle Cammarata:  [name] Other Clinician: Referring Takao Lizer: Treating Moria Brophy/Extender:[name], [name], [name] in Treatment: 7 Clinic Level of Care Assessment Items TOOL 4 Quantity Score X - Use when only an EandM is performed on FOLLOW-UP visit 1 0 ASSESSMENTS - Nursing Assessment / Reassessment X - Reassessment of Co-morbidities (includes updates in patient status) 1 10 X - Reassessment of Adherence to Treatment Plan 1 5 ASSESSMENTS - Wound and Skin Assessment / Reassessment []  - Simple Wound Assessment / Reassessment - one wound 0 X - Complex Wound Assessment / Reassessment - multiple wounds 1 5 []  - Dermatologic / Skin Assessment (not related to wound area) 0 ASSESSMENTS - Focused Assessment []  - Circumferential Edema Measurements - multi extremities 0 []  - Nutritional Assessment / Counseling / Intervention 0 []  - Lower Extremity Assessment (monofilament, tuning fork, pulses) 0 []  - Peripheral Arterial Disease Assessment (using hand held doppler) 0 ASSESSMENTS - Ostomy and/or Continence Assessment and Care []  - Incontinence Assessment and Management 0 []  - Ostomy Care Assessment and Management (repouching, etc.) 0 PROCESS - Coordination of Care X - Simple Patient / Family Education for ongoing care 1 15 []  - Complex (extensive) Patient / Family Education for ongoing care 0 X - Staff obtains Programmer, systems, Records, Test Results / Process Orders 1 10 []  - Staff telephones HHA, Nursing Homes / Clarify orders / etc 0 []  - Routine Transfer to another Facility (non-emergent condition) 0 []  - Routine Hospital Admission (non-emergent condition) 0 []  - New Admissions / Biomedical engineer / Ordering NPWT, Apligraf, etc. 0 []  - Emergency Hospital Admission (emergent condition) 0 X - Simple Discharge Coordination 1 10 []  - Complex (extensive) Discharge Coordination 0 PROCESS - Special Needs []  - Pediatric / Minor Patient Management 0 []  - Isolation Patient Management 0 []  -  Hearing / Language / Visual special needs 0 []  - Assessment of Community assistance (transportation, D/C planning, etc.) 0 []  - Additional assistance / Altered mentation 0 []  -  Support Surface(s) Assessment (bed, cushion, seat, etc.) 0 INTERVENTIONS - Wound Cleansing / Measurement X - Simple Wound Cleansing - one wound 1 5 []  - Complex Wound Cleansing - multiple wounds 0 X - Wound Imaging (photographs - any number of wounds) 1 5 []  - Wound Tracing (instead of photographs) 0 X - Simple Wound Measurement - one wound 1 5 []  - Complex Wound Measurement - multiple wounds 0 INTERVENTIONS - Wound Dressings X - Small Wound Dressing one or multiple wounds 1 10 []  - Medium Wound Dressing one or multiple wounds 0 []  - Large Wound Dressing one or multiple wounds 0 []  - Application of Medications - topical 0 []  - Application of Medications - injection 0 INTERVENTIONS - Miscellaneous []  - External ear exam 0 []  - Specimen Collection (cultures, biopsies, blood, body fluids, etc.) 0 []  - Specimen(s) / Culture(s) sent or taken to Lab for analysis 0 []  - Patient Transfer (multiple staff / Civil Service fast streamer / Similar devices) 0 []  - Simple Staple / Suture removal (25 or less) 0 []  - Complex Staple / Suture removal (26 or more) 0 []  - Hypo / Hyperglycemic Management (close monitor of Blood Glucose) 0 []  - Ankle / Brachial Index (ABI) - do not check if billed separately 0 X - Vital Signs 1 5 Has the patient been seen at the hospital within the last three years: Yes Total Score: 85 Level Of Care: New/Established - Level 3 Electronic Signature(s) Signed: 07/17/2019 5:16:33 PM By: [name] RN Entered By: [name] on 07/16/2019 10:31:42 -------------------------------------------------------------------------------- Encounter Discharge Information Details Patient Name: Date of Service: Tonya Hunter 07/16/2019 9:45 AM Medical Record [id number] Patient Account Number: [id number] Date of  Birth/Sex: Treating RN: 1935/04/17 (85 y.o. [name] Primary Care Gradie Butrick: [name] Other Clinician: Referring Verley Pariseau: Treating Kristeen Lantz/Extender:[name], [name], [name] in Treatment: 7 Encounter Discharge Information Items Discharge Condition: Stable Ambulatory Status: Walker Discharge Destination: Home Transportation: Private Auto Accompanied By: self Schedule Follow-up Appointment: Yes Clinical Summary of Care: Patient Declined Electronic Signature(s) Signed: 07/16/2019 5:07:00 PM By: Kela Millin Entered By: Kela Millin on 07/16/2019 10:39:48 -------------------------------------------------------------------------------- Lower Extremity Assessment Details Patient Name: Date of Service: Tonya Hunter 07/16/2019 9:45 AM Medical Record [id number] Patient Account Number: [id number] Date of Birth/Sex: Treating RN: [birth year] (84 y.o. Tonya Hunter Primary Care Lametria Klunk: [name] Other Clinician: Referring Vara Mairena: Treating Blaire Hodsdon/Extender:[name], [name], [name] Weeks in Treatment: 7 Edema Assessment Assessed: [Left: No] [Right: No] Edema: [Left: Ye] [Right: s] Calf Left: Right: Point of Measurement: 31 cm From Medial Instep cm 35.5 cm Ankle Left: Right: Point of Measurement: 9 cm From Medial Instep cm 24.5 cm Vascular Assessment Pulses: Dorsalis Pedis Palpable: [Right:Yes] Electronic Signature(s) Signed: 07/16/2019 4:52:50 PM By: [name] RN, BSN Entered By: [name] on 07/16/2019 10:12:13 -------------------------------------------------------------------------------- Multi Wound Chart Details Patient Name: Date of Service: Tonya Hunter 07/16/2019 9:45 AM Medical Record [id number] Patient Account Number: [id number] Date of Birth/Sex: Treating RN: Sep 14, 1934 (85 y.o. Tonya Hunter Primary Care Obie Kallenbach: [name] Other Clinician: Referring  Mandee Pluta: Treating Aleksia Freiman/Extender:[name], [name], [name] in Treatment: 7 Vital Signs Height(in): 66 Capillary Blood 167 Glucose(mg/dl): Weight(lbs): 164 Pulse(bpm): 68 Body Mass Index(BMI): 26 Blood Pressure(mmHg): 117/58 Temperature(F): 98.2 Respiratory 18 Rate(breaths/min): Photos: [2:No Photos] [N/A:N/A] Wound Location: [2:Right Toe Great - Medial] [N/A:N/A] Wounding Event: [2:Blister] [N/A:N/A] Primary Etiology: [2:Diabetic Wound/Ulcer of the N/A Lower Extremity] Comorbid History: [2:Cataracts, Anemia, Angina, N/A Congestive Heart Failure, Coronary Artery Disease,  Hypertension, Myocardial Infarction, Type II Diabetes, Osteoarthritis, Neuropathy] Date Acquired: [2:05/22/2019] [N/A:N/A] Weeks of Treatment: [2:7] [N/A:N/A] Wound Status: [2:Open] [N/A:N/A] Measurements L x W x D 0.8x0.7x0.4 [N/A:N/A] (cm) Area (cm) : [2:0.44] [N/A:N/A] Volume (cm) : [2:0.176] [N/A:N/A] % Reduction in Area: [2:93.90%] [N/A:N/A] % Reduction in Volume: [2:75.70%] [N/A:N/A] Classification: [2:Grade 2] [N/A:N/A] Exudate Amount: [2:Small] [N/A:N/A] Exudate Type: [2:Serosanguineous] [N/A:N/A] Exudate Color: [2:red, brown] [N/A:N/A] Wound Margin: [2:Epibole] [N/A:N/A] Granulation Amount: [2:Large (67-100%)] [N/A:N/A] Granulation Quality: [2:Red, Pink] [N/A:N/A] Necrotic Amount: [2:Small (1-33%)] [N/A:N/A] Exposed Structures: [2:Fat Layer (Subcutaneous Tissue) Exposed: Yes Fascia: No Tendon: No Muscle: No Joint: No Bone: No None] [N/A:N/A N/A] Treatment Notes Electronic Signature(s) Signed: 07/16/2019 5:14:23 PM By: Linton Ham MD Signed: 07/17/2019 5:16:33 PM By: [name] RN Entered By: Linton Ham on 07/16/2019 10:33:17 -------------------------------------------------------------------------------- Multi-Disciplinary Care Plan Details Patient Name: Date of Service: Tonya Hunter 07/16/2019 9:45 AM Medical Record [id number] Patient Account Number:  [id number] Date of Birth/Sex: Treating RN: 11/06/1934 (85 y.o. Tonya Hunter Primary Care Jessicaann Overbaugh: [name] Other Clinician: Referring Leeana Creer: Treating Calib Wadhwa/Extender:[name], [name], [name] in Treatment: 7 Active Inactive Abuse / Safety / Falls / Self Care Management Nursing Diagnoses: Potential for falls Goals: Patient/caregiver will verbalize/demonstrate measures taken to prevent injury and/or falls Date Initiated: 05/22/2019 Target Resolution Date: 07/17/2019 Goal Status: Active Interventions: Assess fall risk on admission and as needed Assess impairment of mobility on admission and as needed per policy Assess personal safety and home safety (as indicated) on admission and as needed Notes: Nutrition Nursing Diagnoses: Impaired glucose control: actual or potential Goals: Patient/caregiver will maintain therapeutic glucose control Date Initiated: 05/22/2019 Target Resolution Date: 07/17/2019 Goal Status: Active Interventions: Assess HgA1c results as ordered upon admission and as needed Assess patient nutrition upon admission and as needed per policy Provide education on elevated blood sugars and impact on wound healing Treatment Activities: Patient referred to Primary Care Physician for further nutritional evaluation : 05/22/2019 Notes: Wound/Skin Impairment Nursing Diagnoses: Impaired tissue integrity Knowledge deficit related to ulceration/compromised skin integrity Goals: Patient/caregiver will verbalize understanding of skin care regimen Date Initiated: 05/22/2019 Target Resolution Date: 07/17/2019 Goal Status: Active Ulcer/skin breakdown will have a volume reduction of 30% by week 4 Date Initiated: 05/22/2019 Date Inactivated: 06/19/2019 Target Resolution Date: 06/19/2019 Goal Status: Met Ulcer/skin breakdown will have a volume reduction of 50% by week 8 Date Initiated: 06/19/2019 Target Resolution Date: 07/17/2019 Goal Status:  Active Interventions: Assess patient/caregiver ability to obtain necessary supplies Assess patient/caregiver ability to perform ulcer/skin care regimen upon admission and as needed Assess ulceration(s) every visit Provide education on ulcer and skin care Treatment Activities: Skin care regimen initiated : 05/22/2019 Topical wound management initiated : 05/22/2019 Notes: Electronic Signature(s) Signed: 07/17/2019 5:16:33 PM By: [name] RN Entered By: [name] on 07/16/2019 10:03:58 -------------------------------------------------------------------------------- Pain Assessment Details Patient Name: Date of Service: Tonya Hunter, Tonya Hunter 07/16/2019 9:45 AM Medical Record [id number] Patient Account Number: [id number] Date of Birth/Sex: Treating RN: August 16, 1934 (85 y.o. Tonya Hunter Primary Care Mcgregor Tinnon: [name] Other Clinician: Referring Sophiea Ueda: Treating Carles Florea/Extender:[name], [name], [name] in Treatment: 7 Active Problems Location of Pain Severity and Description of Pain Patient Has Paino No Site Locations Rate the pain. Current Pain Level: 0 Pain Management and Medication Current Pain Management: Electronic Signature(s) Signed: 07/16/2019 4:52:50 PM By: [name] RN, BSN Signed: 07/17/2019 5:16:33 PM By: [name] RN Entered By: [name] on 07/16/2019 10:08:50 -------------------------------------------------------------------------------- Patient/Caregiver Education Details Patient Name: Date of Service: Tonya Hunter  S. 3/2/2021andnbsp9:45 AM Medical Record [id number] Patient Account Number: [id number] Date of Birth/Gender: Treating RN: Sep 13, 1934 (85 y.o. Tonya Hunter Primary Care Physician: [name] Other Clinician: Referring Physician: Treating Physician/Extender:[name], [name], [name] in Treatment: 7 Education Assessment Education Provided To: Patient Education Topics  Provided Wound/Skin Impairment: Methods: Explain/Verbal Responses: State content correctly Electronic Signature(s) Signed: 07/17/2019 5:16:33 PM By: [name] RN Entered By: [name] on 07/16/2019 10:04:11 -------------------------------------------------------------------------------- Wound Assessment Details Patient Name: Date of Service: Tonya Hunter, Tonya Hunter 07/16/2019 9:45 AM Medical Record [id number] Patient Account Number: [id number] Date of Birth/Sex: Treating RN: Sep 27, 1934 (85 y.o. Tonya Hunter Primary Care Jalik Gellatly: [name] Other Clinician: Referring Tonette Koehne: Treating Adalene Gulotta/Extender:[name], [name], [name] Weeks in Treatment: 7 Wound Status Wound Number: 2 Primary Diabetic Wound/Ulcer of the Lower Extremity Etiology: Wound Location: Right Toe Great - Medial Wound Open Wounding Event: Blister Status: Date Acquired: 05/22/2019 Comorbid Cataracts, Anemia, Angina, Congestive Heart Weeks Of Treatment: 7 History: Failure, Coronary Artery Disease, Clustered Wound: No Hypertension, Myocardial Infarction, Type II Diabetes, Osteoarthritis, Neuropathy Photos Wound Measurements Length: (cm) 0.8 Width: (cm) 0.7 Depth: (cm) 0.4 Area: (cm) 0.44 Volume: (cm) 0.176 Wound Description Classification: Grade 2 Wound Margin: Epibole Exudate Amount: Small Exudate Type: Serosanguineous Exudate Color: red, brown Wound Bed Granulation Amount: Large (67-100%) Granulation Quality: Red, Pink Necrotic Amount: Small (1-33%) Necrotic Quality: Adherent Slough Foul Odor After Cleansing: No Slough/Fibrino Yes Exposed Structure Fascia Exposed: N Fat Layer (Subcutaneous Tissue) Exposed: Y Tendon Exposed: N Muscle Exposed: N Joint Exposed: N Bone Exposed: N % Reduction in Area: 93.9% % Reduction in Volume: 75.7% Epithelialization: None Tunneling: No Undermining: No o es o o o o Treatment Notes Wound #2 (Right, Medial Toe Great) 1. Cleanse  With Wound Cleanser 3. Primary Dressing Applied Calcium Alginate Ag 4. Secondary Dressing Dry Gauze Roll Gauze 5. Secured With Recruitment consultant) Signed: 07/17/2019 4:03:04 PM By: Mikeal Hawthorne EMT/HBOT Signed: 07/17/2019 5:16:33 PM By: [name] RN Previous Signature: 07/16/2019 4:52:50 PM Version By: [name] RN, BSN Entered By: Mikeal Hawthorne on 07/17/2019 14:18:44 -------------------------------------------------------------------------------- Vitals Details Patient Name: Date of Service: Tonya Hunter 07/16/2019 9:45 AM Medical Record [id number] Patient Account Number: [id number] Date of Birth/Sex: Treating RN: 03-29-1935 (85 y.o. Tonya Hunter Primary Care Kasim Mccorkle: [name] Other Clinician: Referring Gerrard Crystal: Treating Purva Vessell/Extender:[name], [name], [name] in Treatment: 7 Vital Signs Time Taken: 10:04 Temperature (F): 98.2 Height (in): 66 Pulse (bpm): 68 Weight (lbs): 164 Respiratory Rate (breaths/min): 18 Body Mass Index (BMI): 26.5 Blood Pressure (mmHg): 117/58 Capillary Blood Glucose (mg/dl): 167 Reference Range: 80 - 120 mg / dl Notes glucose per pt last night Electronic Signature(s) Signed: 07/16/2019 4:52:50 PM By: [name] RN, BSN Entered By: [name] on 07/16/2019 10:08:40

## 2019-07-17 NOTE — Progress Notes (Signed)
Hunter, Tonya ([id number]) Visit Report for 07/16/2019 HPI Details Patient Name: Date of Service: Tonya Hunter, Tonya Hunter 07/16/2019 9:45 AM Medical Record [id number] Patient Account Number: [id number] Date of Birth/Sex: Treating RN: December 17, 1934 (85 y.o. [name] Primary Care Provider: [name] Other Clinician: Referring Provider: Treating Provider/Extender:Shloma Roggenkamp, [name], [name] in Treatment: 7 History of Present Illness HPI Description: 05/22/2019 patient is seen today in regard to her bilateral lower extremities this was a referral from her primary care provider they were concerned about areas of bruising and contusion that happened on a regular basis. With that being said the patient really has an issue with her right great toe where she has a significant blister that wraps around to the underside of the toe that has me more concerned to be perfectly honest than the legs themselves do they seem to be doing quite well. I explained in regard to the legs she is good to have ongoing and continued issues with blisters based on the fragility of her skin as long as she is on the Plavix and aspirin this is likely to be an ongoing issue. Honestly what she is going need to do is protect that area. In regard to the toe however that started 2 days ago and she tells me it seems to be getting worse each day. No fevers, chills, nausea, vomiting, or diarrhea. 05/29/2019 on evaluation today patient appears to be doing much better with regard to her leg which in fact appears to be completely healed. That is on the left. In regard to her right great toe this is showing signs of improvement I am very pleased with how things seem to be progressing after we had removed the blistered skin down to an open wound last week. Overall I feel like there is new tissue growth and I am very excited that this in 1 week is doing so well. 06/05/2019 on evaluation today patient appears to be  doing actually fairly well in regard to her toe ulcer. Unfortunately she does have a skin tear on the anterior surface of her right lower extremity which unfortunately is a little bit rolled under on the edge of the skin tear. Nonetheless I think we may be able to straighten this out and if so that would go a long way to help in this area to heal more effectively and quickly. Fortunately there is no signs of active infection at this time. She does have some swelling but again I think this is secondary to the skin tear as opposed to anything else. 06/12/2019 on evaluation today patient appears to be doing about the same in regard to the toe. With that being said she is having issues with the Steri-Strips having come off on her leg and there was some skin that actually necrosis and did slough off at this point. Fortunately there is no evidence of active infection at this time. She is very concerned about the possibility of amputation she states her daughter is also very concerned. With that being said I have not seen her daughter up to this point. Also have not really realized until today that the patient may have a component of dementia that may be preventing complete compliance and remembering of what needs to be done as far as taking care of her wound. 06/19/2019 on evaluation today patient appears to be doing well with regard to her lower extremity ulcer the toe ulcer is measuring better size wise but does have a little bit  more depth. Again I do think we may want proceed with an x- ray at this point in order to further evaluate this and see if there is anything we need to do to aid in getting this to heal more appropriately and quickly. Fortunately there is no signs of systemic infection at this time. 06/26/2019 upon evaluation today patient actually appears to be doing quite well with regard to her lower extremity ulcer and her toe ulcer. I did prescribe her doxycycline in the interim since I last  saw her last week and this week. She contacted the office on Friday and wanted to see about getting something at that point based on the results of the x-ray finding. There did not appear to be any signs of osteomyelitis but there was some evidence potentially of infection. Nonetheless I did send this in for her. 2/15; patient was worked in the clinic today after calling with severe pain in the right great toe. She had an x-ray done last week that was negative. She was put on doxycycline because of possible cellulitis. There is significant erythema in the dorsal aspect of the right great toe. The remaining wound is on the plantar aspect of the interphalangeal joint of the right great toe. Other wounds that were on the lower extremities seem healed. We have been using Iodoflex to the wound on the right great toe The patient has a penicillin allergy dating back into her early 55s at that point in time she developed swelling in the foot and ataxia of gait. She did not have a rash, anaphylactoid issues as far as I can tell and as far she remembers 07/03/19 upon evaluation today patient appears to be doing still poorly in regard to her great toe ulcer. Unfortunately she tells me that she is still having discomfort she did have to see Dr. [name] due to things not doing so well on the 15th which was just a couple of days ago. He did switcher at that time as far as the antibiotics were concerned to a different oral antibiotic. This was Keflex. With that being said she maybe does appear to be doing somewhat better though she does have a small abscess area in the toe I am still really feeling like there may be something greater going on here I think the patient may need to have them ride to further evaluate the situation. 2/23; patient seems to alternate clinics on a weekly basis. I note that she had an MRI showing a soft tissue ulceration of the medial plantar base of the great toe with underlying early  osteomyelitis of the first distal phalanx. I gave her Keflex a week or so ago for what looked to be cellulitis of the right great toe resistant to doxycycline and this looks a lot better. We have been using silver alginate. 3/2; the patient had lab work done unfortunately she spent a long time in the ER for it which was really not necessary. Her creatinine was 1.03 white count 3.5 hemoglobin 11.9 differential count essentially normal. Sedimentation rate at 32 BMP otherwise normal. C-reactive protein was not elevated at 0.6. The patient started on Levaquin last week she had already had a week of Keflex and perhaps doxycycline before this. My goal is that give her a least 3 or 4 weeks of Levaquin. She is tolerating this well without complaints Electronic Signature(s) Signed: 07/16/2019 5:14:23 PM By: Linton Ham MD Entered By: Linton Ham on 07/16/2019 10:35:08 -------------------------------------------------------------------------------- Physical Exam Details Patient Name:  Date of Service: AVABELLA, WAILES 07/16/2019 9:45 AM Medical Record [id number] Patient Account Number: [id number] Date of Birth/Sex: Treating RN: 11/02/1934 (85 y.o. [name] Primary Care Provider: [name] Other Clinician: Referring Provider: Treating Provider/Extender:Arleny Kruger, [name], [name] in Treatment: 7 Constitutional Sitting or standing Blood Pressure is within target range for patient.. Pulse regular and within target range for patient.Marland Kitchen Respirations regular, non-labored and within target range.. Temperature is normal and within the target range for the patient.Marland Kitchen Appears in no distress. Cardiovascular Pedal pulses are palpable. Integumentary (Hair, Skin) No tenderness over the interphalangeal joint. Notes Wound exam; wound although there is still rolled edges. I did not debride this. No evidence of cellulitis and this seems to be getting better. There is no  tenderness over the interphalangeal joint Electronic Signature(s) Signed: 07/16/2019 5:14:23 PM By: Linton Ham MD Entered By: Linton Ham on 07/16/2019 10:36:48 -------------------------------------------------------------------------------- Physician Orders Details Patient Name: Date of Service: [name] 07/16/2019 9:45 AM Medical Record [id number] Patient Account Number: [id number] Date of Birth/Sex: Treating RN: Feb 19, 1935 (84 y.o. [name] Primary Care Provider: [name] Other Clinician: Referring Provider: Treating Provider/Extender:Shaneisha Burkel, [name], [name] in Treatment: 7 Verbal / Phone Orders: No Diagnosis Coding ICD-10 Coding Code Description I87.2 Venous insufficiency (chronic) (peripheral) L97.822 Non-pressure chronic ulcer of other part of left lower leg with fat layer exposed S81.801A Unspecified open wound, right lower leg, initial encounter E11.622 Type 2 diabetes mellitus with other skin ulcer L97.512 Non-pressure chronic ulcer of other part of right foot with fat layer exposed M86.671 Other chronic osteomyelitis, right ankle and foot I10 Essential (primary) hypertension I50.42 Chronic combined systolic (congestive) and diastolic (congestive) heart failure I25.10 Atherosclerotic heart disease of native coronary artery without angina pectoris Follow-up Appointments Return Appointment in 1 week. - TUESDAY Dressing Change Frequency Wound #2 Right,Medial Toe Great Change Dressing every other day. Skin Barriers/Peri-Wound Care Moisturizing lotion - to right leg Wound Cleansing Wound #2 Right,Medial Toe Great Clean wound with Wound Cleanser Primary Wound Dressing Wound #2 Right,Medial Toe Great Calcium Alginate with Silver Secondary Dressing Wound #2 Right,Medial Toe Great Foam - donut Kerlix/Rolled Gauze Dry Gauze Edema Control Avoid standing for long periods of time Elevate legs to the level of the heart  or above for 30 minutes daily and/or when sitting, a frequency of: - whenever sitting throughout the day Exercise regularly Support Garment 20-30 mm/Hg pressure to: - to both legs daily Off-Loading Open toe surgical shoe to: - RIGHT FOOT Electronic Signature(s) Signed: 07/16/2019 5:14:23 PM By: Linton Ham MD Signed: 07/17/2019 5:16:33 PM By: [name] RN Entered By: [name] on 07/16/2019 10:30:08 -------------------------------------------------------------------------------- Problem List Details Patient Name: Date of Service: [name] 07/16/2019 9:45 AM Medical Record [name] Patient Account Number: [id number] Date of Birth/Sex: Treating RN: 05/09/1935 (85 y.o. [name] Primary Care Provider: [name] Other Clinician: Referring Provider: Treating Provider/Extender:Evianna Chandran, [name], [name] in Treatment: 7 Active Problems ICD-10 Evaluated Encounter Code Description Active Date Today Diagnosis I87.2 Venous insufficiency (chronic) (peripheral) 05/22/2019 No Yes L97.822 Non-pressure chronic ulcer of other part of left lower 05/22/2019 No Yes leg with fat layer exposed S81.801A Unspecified open wound, right lower leg, initial 06/05/2019 No Yes encounter E11.622 Type 2 diabetes mellitus with other skin ulcer 05/22/2019 No Yes L97.512 Non-pressure chronic ulcer of other part of right foot 05/22/2019 No Yes with fat layer exposed M86.671 Other chronic osteomyelitis, right ankle and foot 07/09/2019 No Yes I10 Essential (  primary) hypertension 05/22/2019 No Yes I50.42 Chronic combined systolic (congestive) and diastolic 10/17/1495 No Yes (congestive) heart failure I25.10 Atherosclerotic heart disease of native coronary artery 05/22/2019 No Yes without angina pectoris Inactive Problems ICD-10 Code Description Active Date Inactive Date L03.115 Cellulitis of right lower limb 07/01/2019 07/01/2019 Resolved Problems Electronic Signature(s) Signed:  07/16/2019 5:14:23 PM By: Linton Ham MD Entered By: Linton Ham on 07/16/2019 10:33:09 -------------------------------------------------------------------------------- Progress Note Details Patient Name: Date of Service: [name] 07/16/2019 9:45 AM Medical Record [id number] Patient Account Number: [id number] Date of Birth/Sex: Treating RN: 1934/10/23 (85 y.o. [name] Primary Care Provider: [name] Other Clinician: Referring Provider: Treating Provider/Extender:Christop Hippert, [name], [name] in Treatment: 7 Subjective History of Present Illness (HPI) 05/22/2019 patient is seen today in regard to her bilateral lower extremities this was a referral from her primary care provider they were concerned about areas of bruising and contusion that happened on a regular basis. With that being said the patient really has an issue with her right great toe where she has a significant blister that wraps around to the underside of the toe that has me more concerned to be perfectly honest than the legs themselves do they seem to be doing quite well. I explained in regard to the legs she is good to have ongoing and continued issues with blisters based on the fragility of her skin as long as she is on the Plavix and aspirin this is likely to be an ongoing issue. Honestly what she is going need to do is protect that area. In regard to the toe however that started 2 days ago and she tells me it seems to be getting worse each day. No fevers, chills, nausea, vomiting, or diarrhea. 05/29/2019 on evaluation today patient appears to be doing much better with regard to her leg which in fact appears to be completely healed. That is on the left. In regard to her right great toe this is showing signs of improvement I am very pleased with how things seem to be progressing after we had removed the blistered skin down to an open wound last week. Overall I feel like there is new  tissue growth and I am very excited that this in 1 week is doing so well. 06/05/2019 on evaluation today patient appears to be doing actually fairly well in regard to her toe ulcer. Unfortunately she does have a skin tear on the anterior surface of her right lower extremity which unfortunately is a little bit rolled under on the edge of the skin tear. Nonetheless I think we may be able to straighten this out and if so that would go a long way to help in this area to heal more effectively and quickly. Fortunately there is no signs of active infection at this time. She does have some swelling but again I think this is secondary to the skin tear as opposed to anything else. 06/12/2019 on evaluation today patient appears to be doing about the same in regard to the toe. With that being said she is having issues with the Steri-Strips having come off on her leg and there was some skin that actually necrosis and did slough off at this point. Fortunately there is no evidence of active infection at this time. She is very concerned about the possibility of amputation she states her daughter is also very concerned. With that being said I have not seen her daughter up to this point. Also have not really realized until  today that the patient may have a component of dementia that may be preventing complete compliance and remembering of what needs to be done as far as taking care of her wound. 06/19/2019 on evaluation today patient appears to be doing well with regard to her lower extremity ulcer the toe ulcer is measuring better size wise but does have a little bit more depth. Again I do think we may want proceed with an x- ray at this point in order to further evaluate this and see if there is anything we need to do to aid in getting this to heal more appropriately and quickly. Fortunately there is no signs of systemic infection at this time. 06/26/2019 upon evaluation today patient actually appears to be doing  quite well with regard to her lower extremity ulcer and her toe ulcer. I did prescribe her doxycycline in the interim since I last saw her last week and this week. She contacted the office on Friday and wanted to see about getting something at that point based on the results of the x-ray finding. There did not appear to be any signs of osteomyelitis but there was some evidence potentially of infection. Nonetheless I did send this in for her. 2/15; patient was worked in the clinic today after calling with severe pain in the right great toe. She had an x-ray done last week that was negative. She was put on doxycycline because of possible cellulitis. There is significant erythema in the dorsal aspect of the right great toe. The remaining wound is on the plantar aspect of the interphalangeal joint of the right great toe. Other wounds that were on the lower extremities seem healed. We have been using Iodoflex to the wound on the right great toe The patient has a penicillin allergy dating back into her early 44s at that point in time she developed swelling in the foot and ataxia of gait. She did not have a rash, anaphylactoid issues as far as I can tell and as far she remembers 07/03/19 upon evaluation today patient appears to be doing still poorly in regard to her great toe ulcer. Unfortunately she tells me that she is still having discomfort she did have to see Dr. [name] due to things not doing so well on the 15th which was just a couple of days ago. He did switcher at that time as far as the antibiotics were concerned to a different oral antibiotic. This was Keflex. With that being said she maybe does appear to be doing somewhat better though she does have a small abscess area in the toe I am still really feeling like there may be something greater going on here I think the patient may need to have them ride to further evaluate the situation. 2/23; patient seems to alternate clinics on a weekly basis.  I note that she had an MRI showing a soft tissue ulceration of the medial plantar base of the great toe with underlying early osteomyelitis of the first distal phalanx. I gave her Keflex a week or so ago for what looked to be cellulitis of the right great toe resistant to doxycycline and this looks a lot better. We have been using silver alginate. 3/2; the patient had lab work done unfortunately she spent a long time in the ER for it which was really not necessary. Her creatinine was 1.03 white count 3.5 hemoglobin 11.9 differential count essentially normal. Sedimentation rate at 32 BMP otherwise normal. C-reactive protein was not elevated at 0.6. The  patient started on Levaquin last week she had already had a week of Keflex and perhaps doxycycline before this. My goal is that give her a least 3 or 4 weeks of Levaquin. She is tolerating this well without complaints Objective Constitutional Sitting or standing Blood Pressure is within target range for patient.. Pulse regular and within target range for patient.Marland Kitchen Respirations regular, non-labored and within target range.. Temperature is normal and within the target range for the patient.Marland Kitchen Appears in no distress. Vitals Time Taken: 10:04 AM, Height: 66 in, Weight: 164 lbs, BMI: 26.5, Temperature: 98.2 F, Pulse: 68 bpm, Respiratory Rate: 18 breaths/min, Blood Pressure: 117/58 mmHg, Capillary Blood Glucose: 167 mg/dl. General Notes: glucose per pt last night Cardiovascular Pedal pulses are palpable. General Notes: Wound exam; wound although there is still rolled edges. I did not debride this. No evidence of cellulitis and this seems to be getting better. There is no tenderness over the interphalangeal joint Integumentary (Hair, Skin) No tenderness over the interphalangeal joint. Wound #2 status is Open. Original cause of wound was Blister. The wound is located on the [employer]. The wound measures 0.8cm length x 0.7cm width x 0.4cm  depth; 0.44cm^2 area and 0.176cm^3 volume. There is Fat Layer (Subcutaneous Tissue) Exposed exposed. There is no tunneling or undermining noted. There is a small amount of serosanguineous drainage noted. The wound margin is epibole. There is large (67-100%) red, pink granulation within the wound bed. There is a small (1-33%) amount of necrotic tissue within the wound bed including Adherent Slough. Assessment Active Problems ICD-10 Venous insufficiency (chronic) (peripheral) Non-pressure chronic ulcer of other part of left lower leg with fat layer exposed Unspecified open wound, right lower leg, initial encounter Type 2 diabetes mellitus with other skin ulcer Non-pressure chronic ulcer of other part of right foot with fat layer exposed Other chronic osteomyelitis, right ankle and foot Essential (primary) hypertension Chronic combined systolic (congestive) and diastolic (congestive) heart failure Atherosclerotic heart disease of native coronary artery without angina pectoris Plan Follow-up Appointments: Return Appointment in 1 week. - TUESDAY Dressing Change Frequency: Wound #2 Right,Medial Toe Great: Change Dressing every other day. Skin Barriers/Peri-Wound Care: Moisturizing lotion - to right leg Wound Cleansing: Wound #2 Right,Medial Toe Great: Clean wound with Wound Cleanser Primary Wound Dressing: Wound #2 Right,Medial Toe Great: Calcium Alginate with Silver Secondary Dressing: Wound #2 Right,Medial Toe Great: Foam - donut Kerlix/Rolled Gauze Dry Gauze Edema Control: Avoid standing for long periods of time Elevate legs to the level of the heart or above for 30 minutes daily and/or when sitting, a frequency of: - whenever sitting throughout the day Exercise regularly Support Garment 20-30 mm/Hg pressure to: - to both legs daily Off-Loading: Open toe surgical shoe to: - RIGHT FOOT 1. Continue with silver alginate to the medial right great toe. 2. She is tolerating the  Levaquin well and says pain is better. The erythema on the toe is certainly a lot better. 3. Next week if there is no change in surface area she may need debridement of the rolled edges around this 4. We are offloading in a surgical shoe 5. I will need to represcribe her Levaquin next week probably for a final 2 weeks. Her inflammatory markers are not that high. This was based on MRI. She had had a week of doxycycline a week of Keflex before this. Electronic Signature(s) Signed: 07/16/2019 5:14:23 PM By: Linton Ham MD Entered By: Linton Ham on 07/16/2019 10:39:35 -------------------------------------------------------------------------------- SuperBill Details Patient Name: Date of Service:  AVIKA, CARBINE 07/16/2019 Medical Record [id number] Patient Account Number: [id number] Date of Birth/Sex: Treating RN: Dec 11, 1934 (85 y.o. [name] Primary Care Provider: [name] Other Clinician: Referring Provider: Treating Provider/Extender:Andres Escandon, [name], [name] in Treatment: 7 Diagnosis Coding ICD-10 Codes Code Description I87.2 Venous insufficiency (chronic) (peripheral) L97.822 Non-pressure chronic ulcer of other part of left lower leg with fat layer exposed S81.801A Unspecified open wound, right lower leg, initial encounter E11.622 Type 2 diabetes mellitus with other skin ulcer L97.512 Non-pressure chronic ulcer of other part of right foot with fat layer exposed M86.671 Other chronic osteomyelitis, right ankle and foot I10 Essential (primary) hypertension I50.42 Chronic combined systolic (congestive) and diastolic (congestive) heart failure I25.10 Atherosclerotic heart disease of native coronary artery without angina pectoris Facility Procedures CPT4 Code: [id number] Description: 22979 - WOUND CARE VISIT-LEV 3 EST PT Modifier: Quantity: 1 Physician Procedures CPT4 Code Description: [id number] 17408 - WC PHYS LEVEL 4 - EST PT ICD-10 Diagnosis  Description M86.671 Other chronic osteomyelitis, right ankle and foot L97.512 Non-pressure chronic ulcer of other part of right foot w Modifier: ith fat layer e Quantity: 1 xposed Electronic Signature(s) Signed: 07/16/2019 5:14:23 PM By: Linton Ham MD Entered By: Linton Ham on 07/16/2019 10:39:58

## 2019-07-18 ENCOUNTER — Ambulatory Visit (INDEPENDENT_AMBULATORY_CARE_PROVIDER_SITE_OTHER): Payer: Medicare Other | Admitting: *Deleted

## 2019-07-18 DIAGNOSIS — R2990 Unspecified symptoms and signs involving the nervous system: Secondary | ICD-10-CM

## 2019-07-18 NOTE — Progress Notes (Signed)
ILR Remote 

## 2019-07-19 ENCOUNTER — Encounter: Payer: Self-pay | Admitting: Physical Therapy

## 2019-07-19 ENCOUNTER — Other Ambulatory Visit: Payer: Self-pay

## 2019-07-19 ENCOUNTER — Ambulatory Visit: Payer: Medicare Other | Attending: Diagnostic Neuroimaging | Admitting: Physical Therapy

## 2019-07-19 DIAGNOSIS — R2689 Other abnormalities of gait and mobility: Secondary | ICD-10-CM | POA: Diagnosis not present

## 2019-07-19 DIAGNOSIS — R2681 Unsteadiness on feet: Secondary | ICD-10-CM | POA: Insufficient documentation

## 2019-07-19 DIAGNOSIS — R262 Difficulty in walking, not elsewhere classified: Secondary | ICD-10-CM | POA: Insufficient documentation

## 2019-07-19 DIAGNOSIS — M6281 Muscle weakness (generalized): Secondary | ICD-10-CM | POA: Insufficient documentation
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
## 2019-07-20 ENCOUNTER — Ambulatory Visit: Payer: Medicare Other | Attending: Internal Medicine

## 2019-07-20 DIAGNOSIS — Z23 Encounter for immunization: Secondary | ICD-10-CM | POA: Insufficient documentation

## 2019-07-20 NOTE — Progress Notes (Signed)
   Covid-19 Vaccination Clinic  Name:  Tonya Hunter    MRN: [id number] DOB: 07/24/1934  07/20/2019  Tonya Hunter was observed post Covid-19 immunization for 15 minutes without incident. She was provided with Vaccine Information Sheet and instruction to access the V-Safe system.   Tonya Hunter was instructed to call 911 with any severe reactions post vaccine: Marland Kitchen Difficulty breathing  . Swelling of face and throat  . A fast heartbeat  . A bad rash all over body  . Dizziness and weakness   Immunizations Administered    Name Date Dose VIS Date Route   Pfizer COVID-19 Vaccine 07/20/2019 10:48 AM 0.3 mL 04/26/2019 Intramuscular   Manufacturer: [place]   Lot: [id number]   [place]: [id number]

## 2019-07-23 ENCOUNTER — Other Ambulatory Visit: Payer: Self-pay

## 2019-07-23 ENCOUNTER — Encounter (HOSPITAL_BASED_OUTPATIENT_CLINIC_OR_DEPARTMENT_OTHER): Payer: Medicare Other | Admitting: Internal Medicine

## 2019-07-23 DIAGNOSIS — L97512 Non-pressure chronic ulcer of other part of right foot with fat layer exposed: Secondary | ICD-10-CM | POA: Diagnosis not present

## 2019-07-23 DIAGNOSIS — I872 Venous insufficiency (chronic) (peripheral): Secondary | ICD-10-CM | POA: Diagnosis not present

## 2019-07-23 DIAGNOSIS — E11622 Type 2 diabetes mellitus with other skin ulcer: Secondary | ICD-10-CM | POA: Diagnosis not present

## 2019-07-23 DIAGNOSIS — M86671 Other chronic osteomyelitis, right ankle and foot: Secondary | ICD-10-CM | POA: Diagnosis not present

## 2019-07-23 DIAGNOSIS — Z7982 Long term (current) use of aspirin: Secondary | ICD-10-CM | POA: Diagnosis not present

## 2019-07-23 DIAGNOSIS — S81801A Unspecified open wound, right lower leg, initial encounter: Secondary | ICD-10-CM | POA: Diagnosis not present

## 2019-07-23 DIAGNOSIS — Z7902 Long term (current) use of antithrombotics/antiplatelets: Secondary | ICD-10-CM | POA: Diagnosis not present

## 2019-07-23 DIAGNOSIS — E11621 Type 2 diabetes mellitus with foot ulcer: Secondary | ICD-10-CM | POA: Diagnosis not present

## 2019-07-24 NOTE — Progress Notes (Signed)
Tonya Hunter, Tonya Hunter ([id number]) Visit Report for 07/23/2019 Debridement Details Patient Name: Date of Service: Tonya Hunter, Tonya Hunter 07/23/2019 10:45 AM Medical Record [id number] Patient Account Number: [id number] Date of Birth/Sex: Treating RN: [birth year] (85 y.o. [name] Primary Care Provider: [name] Other Clinician: Referring Provider: Treating Provider/Extender:Natash Berman, [name], [name] in Treatment: 8 Debridement Performed for Wound #2 Right,Medial Toe Great Assessment: Performed By: Physician Ricard Dillon., MD Debridement Type: Debridement Severity of Tissue Pre Fat layer exposed Debridement: Level of Consciousness (Pre- Awake and Alert procedure): Pre-procedure Verification/Time Out Taken: Yes - 12:18 Start Time: 12:18 Pain Control: Lidocaine 5% topical ointment Total Area Debrided (L x W): 0.9 (cm) x 1.2 (cm) = 1.08 (cm) Tissue and other material Viable, Non-Viable, Callus, Slough, Subcutaneous, Skin: Dermis , Skin: Epidermis, debrided: Slough Level: Skin/Subcutaneous Tissue Debridement Description: Excisional Instrument: Curette Bleeding: Moderate Hemostasis Achieved: Silver Nitrate End Time: 12:20 Procedural Pain: 0 Post Procedural Pain: 0 Response to Treatment: Procedure was tolerated well Level of Consciousness Awake and Alert (Post-procedure): Post Debridement Measurements of Total Wound Length: (cm) 0.9 Width: (cm) 1.2 Depth: (cm) 0.3 Volume: (cm) 0.254 Character of Wound/Ulcer Post Improved Debridement: Severity of Tissue Post Debridement: Fat layer exposed Post Procedure Diagnosis Same as Pre-procedure Electronic Signature(s) Signed: 07/23/2019 5:48:31 PM By: Linton Ham MD Signed: 07/24/2019 7:20:02 AM By: [name] RN Entered By: Linton Ham on 07/23/2019 12:55:23 -------------------------------------------------------------------------------- HPI Details Patient Name: Date of Service: Tonya Hunter 07/23/2019 10:45 AM Medical Record [id number] Patient Account Number: [id number] Date of Birth/Sex: Treating RN: [birth year]Tonya Hunter (85 y.o. [name] Primary Care Provider: [name] Other Clinician: Referring Provider: Treating Provider/Extender:Everet Flagg, [name], [name] in Treatment: 8 History of Present Illness HPI Description: 05/22/2019 patient is seen today in regard to her bilateral lower extremities this was a referral from her primary care provider they were concerned about areas of bruising and contusion that happened on a regular basis. With that being said the patient really has an issue with her right great toe where she has a significant blister that wraps around to the underside of the toe that has me more concerned to be perfectly honest than the legs themselves do they seem to be doing quite well. I explained in regard to the legs she is good to have ongoing and continued issues with blisters based on the fragility of her skin as long as she is on the Plavix and aspirin this is likely to be an ongoing issue. Honestly what she is going need to do is protect that area. In regard to the toe however that started 2 days ago and she tells me it seems to be getting worse each day. No fevers, chills, nausea, vomiting, or diarrhea. 05/29/2019 on evaluation today patient appears to be doing much better with regard to her leg which in fact appears to be completely healed. That is on the left. In regard to her right great toe this is showing signs of improvement I am very pleased with how things seem to be progressing after we had removed the blistered skin down to an open wound last week. Overall I feel like there is new tissue growth and I am very excited that this in 1 week is doing so well. 06/05/2019 on evaluation today patient appears to be doing actually fairly well in regard to her toe ulcer. Unfortunately she does have a skin tear on the  anterior surface of her right lower extremity which unfortunately is  a little bit rolled under on the edge of the skin tear. Nonetheless I think we may be able to straighten this out and if so that would go a long way to help in this area to heal more effectively and quickly. Fortunately there is no signs of active infection at this time. She does have some swelling but again I think this is secondary to the skin tear as opposed to anything else. 06/12/2019 on evaluation today patient appears to be doing about the same in regard to the toe. With that being said she is having issues with the Steri-Strips having come off on her leg and there was some skin that actually necrosis and did slough off at this point. Fortunately there is no evidence of active infection at this time. She is very concerned about the possibility of amputation she states her daughter is also very concerned. With that being said I have not seen her daughter up to this point. Also have not really realized until today that the patient may have a component of dementia that may be preventing complete compliance and remembering of what needs to be done as far as taking care of her wound. 06/19/2019 on evaluation today patient appears to be doing well with regard to her lower extremity ulcer the toe ulcer is measuring better size wise but does have a little bit more depth. Again I do think we may want proceed with an x- ray at this point in order to further evaluate this and see if there is anything we need to do to aid in getting this to heal more appropriately and quickly. Fortunately there is no signs of systemic infection at this time. 06/26/2019 upon evaluation today patient actually appears to be doing quite well with regard to her lower extremity ulcer and her toe ulcer. I did prescribe her doxycycline in the interim since I last saw her last week and this week. She contacted the office on Friday and wanted to see about getting  something at that point based on the results of the x-ray finding. There did not appear to be any signs of osteomyelitis but there was some evidence potentially of infection. Nonetheless I did send this in for her. 2/Hunter; patient was worked in the clinic today after calling with severe pain in the right great toe. She had an x-ray done last week that was negative. She was put on doxycycline because of possible cellulitis. There is significant erythema in the dorsal aspect of the right great toe. The remaining wound is on the plantar aspect of the interphalangeal joint of the right great toe. Other wounds that were on the lower extremities seem healed. We have been using Iodoflex to the wound on the right great toe The patient has a penicillin allergy dating back into her early 22s at that point in time she developed swelling in the foot and ataxia of gait. She did not have a rash, anaphylactoid issues as far as I can tell and as far she remembers 07/03/19 upon evaluation today patient appears to be doing still poorly in regard to her great toe ulcer. Unfortunately she tells me that she is still having discomfort she did have to see Dr. [name] due to things not doing so well on the 15th which was just a couple of days ago. He did switcher at that time as far as the antibiotics were concerned to a different oral antibiotic. This was Keflex. With that being said she maybe does appear  to be doing somewhat better though she does have a small abscess area in the toe I am still really feeling like there may be something greater going on here I think the patient may need to have them ride to further evaluate the situation. 2/23; patient seems to alternate clinics on a weekly basis. I note that she had an MRI showing a soft tissue ulceration of the medial plantar base of the great toe with underlying early osteomyelitis of the first distal phalanx. I gave her Keflex a week or so ago for what looked to be  cellulitis of the right great toe resistant to doxycycline and this looks a lot better. We have been using silver alginate. 3/2; the patient had lab work done unfortunately she spent a long time in the ER for it which was really not necessary. Her creatinine was 1.03 white count 3.5 hemoglobin 11.9 differential count essentially normal. Sedimentation rate at 32 BMP otherwise normal. C-reactive protein was not elevated at 0.6. The patient started on Levaquin last week she had already had a week of Keflex and perhaps doxycycline before this. My goal is that give her a least 3 or 4 weeks of Levaquin. She is tolerating this well without complaints 3/9; the patient is tolerating the Levaquin well. She will probably need at least another 2 or 3 weeks of this. Wound on the right medial toe looks better. Using silver alginate but I changed to silver collagen today She also scratched herself while putting on her stockings with a nail. She has a new wound on the right medial calf Electronic Signature(s) Signed: 07/23/2019 5:48:31 PM By: Linton Ham MD Entered By: Linton Ham on 07/23/2019 12:56:44 -------------------------------------------------------------------------------- Physical Exam Details Patient Name: Date of Service: Tonya Hunter 07/23/2019 10:45 AM Medical Record [id number] Patient Account Number: [id number] Date of Birth/Sex: Treating RN: [birth year]Tonya Hunter (85 y.o. [name] Primary Care Provider: [name] Other Clinician: Referring Provider: Treating Provider/Extender:Shontez Sermon, [name], [name] in Treatment: 8 Constitutional Patient is hypotensive. However appears well. Pulse regular and within target range for patient.Marland Kitchen Respirations regular, non-labored and within target range.. Temperature is normal and within the target range for the patient.Marland Kitchen Appears in no distress. Cardiovascular Pedal pulses palpable. Severe chronic venous  insufficiency. Notes Wound exam; the wound looks somewhat better. Rolled edges around the wound debrided with a #5 curette removing skin and subcutaneous tissue from around the circumference of the wound. There is no evidence of cellulitis the swelling in the toe is getting better. No tenderness over the inner phalangeal joint Electronic Signature(s) Signed: 07/23/2019 5:48:31 PM By: Linton Ham MD Entered By: Linton Ham on 07/23/2019 13:00:31 -------------------------------------------------------------------------------- Physician Orders Details Patient Name: Date of Service: Tonya Hunter 07/23/2019 10:45 AM Medical Record [id number] Patient Account Number: [id number] Date of Birth/Sex: Treating RN: [birth year], Tonya Hunter (84 y.o. [name] Primary Care Provider: [name] Other Clinician: Referring Provider: Treating Provider/Extender:Raquel Racey, [name], [name] in Treatment: 8 Verbal / Phone Orders: No Diagnosis Coding ICD-10 Coding Code Description I87.2 Venous insufficiency (chronic) (peripheral) L97.822 Non-pressure chronic ulcer of other part of left lower leg with fat layer exposed S81.801A Unspecified open wound, right lower leg, initial encounter E11.622 Type 2 diabetes mellitus with other skin ulcer L97.512 Non-pressure chronic ulcer of other part of right foot with fat layer exposed M86.671 Other chronic osteomyelitis, right ankle and foot I10 Essential (primary) hypertension I50.42 Chronic combined systolic (congestive) and diastolic (congestive) heart failure I25.10 Atherosclerotic heart disease of  native coronary artery without angina pectoris Follow-up Appointments Return Appointment in 1 week. - TUESDAY Dressing Change Frequency Wound #2 Right,Medial Toe Great Change Dressing every other day. Skin Barriers/Peri-Wound Care Moisturizing lotion - to right leg Wound Cleansing Wound #2 Right,Medial Toe Great Clean wound with Wound  Cleanser Primary Wound Dressing Wound #2 Right,Medial Toe Great Silver Collagen - moisten with normal saline Wound #4 Right,Medial Lower Leg Silver Collagen - moisten with normal saline Secondary Dressing Wound #2 Right,Medial Toe Great Kerlix/Rolled Gauze Dry Gauze Wound #4 Right,Medial Lower Leg Kerlix/Rolled Gauze Dry Gauze Edema Control Avoid standing for long periods of time Elevate legs to the level of the heart or above for 30 minutes daily and/or when sitting, a frequency of: - whenever sitting throughout the day Exercise regularly Support Garment 20-30 mm/Hg pressure to: - to both legs daily Off-Loading Open toe surgical shoe to: - RIGHT FOOT Electronic Signature(s) Signed: 07/23/2019 5:48:31 PM By: Linton Ham MD Signed: 07/24/2019 7:20:02 AM By: [name] RN Entered By: [name] on 07/23/2019 12:23:30 -------------------------------------------------------------------------------- Problem List Details Patient Name: Date of Service: Tonya Hunter 07/23/2019 10:45 AM Medical Record [id number] Patient Account Number: [id number] Date of Birth/Sex: Treating RN: Tonya Hunter[birth year] (85 y.o. [name] Primary Care Provider: [name] Other Clinician: Referring Provider: Treating Provider/Extender:Lakyn Mantione, [name], [name] in Treatment: 8 Active Problems ICD-10 Evaluated Encounter Code Description Active Date Today Diagnosis I87.2 Venous insufficiency (chronic) (peripheral) 05/22/2019 No Yes L97.512 Non-pressure chronic ulcer of other part of right foot 05/22/2019 No Yes with fat layer exposed S81.801A Unspecified open wound, right lower leg, initial 06/05/2019 No Yes encounter E11.622 Type 2 diabetes mellitus with other skin ulcer 05/22/2019 No Yes M86.671 Other chronic osteomyelitis, right ankle and foot 07/09/2019 No Yes I50.42 Chronic combined systolic (congestive) and diastolic 12/18/1322 No Yes (congestive) heart failure I25.10  Atherosclerotic heart disease of native coronary artery 05/22/2019 No Yes without angina pectoris Inactive Problems ICD-10 Code Description Active Date Inactive Date L03.115 Cellulitis of right lower limb 2/Hunter/2021 2/Hunter/2021 L97.822 Non-pressure chronic ulcer of other part of left lower leg with 05/22/2019 05/22/2019 fat layer exposed I10 Essential (primary) hypertension 05/22/2019 05/22/2019 Resolved Problems Electronic Signature(s) Signed: 07/23/2019 5:48:31 PM By: Linton Ham MD Entered By: Linton Ham on 07/23/2019 12:57:35 -------------------------------------------------------------------------------- Progress Note Details Patient Name: Date of Service: Tonya Hunter 07/23/2019 10:45 AM Medical Record [id number] Patient Account Number: [id number] Date of Birth/Sex: Treating RN: Tonya Hunter, Tonya Hunter (85 y.o. [name] Primary Care Provider: [name] Other Clinician: Referring Provider: Treating Provider/Extender:Calvina Liptak, [name], [name] in Treatment: 8 Subjective History of Present Illness (HPI) 05/22/2019 patient is seen today in regard to her bilateral lower extremities this was a referral from her primary care provider they were concerned about areas of bruising and contusion that happened on a regular basis. With that being said the patient really has an issue with her right great toe where she has a significant blister that wraps around to the underside of the toe that has me more concerned to be perfectly honest than the legs themselves do they seem to be doing quite well. I explained in regard to the legs she is good to have ongoing and continued issues with blisters based on the fragility of her skin as long as she is on the Plavix and aspirin this is likely to be an ongoing issue. Honestly what she is going need to do is protect that area. In regard to the toe however that started 2  days ago and she tells me it seems to be getting worse each day.  No fevers, chills, nausea, vomiting, or diarrhea. 05/29/2019 on evaluation today patient appears to be doing much better with regard to her leg which in fact appears to be completely healed. That is on the left. In regard to her right great toe this is showing signs of improvement I am very pleased with how things seem to be progressing after we had removed the blistered skin down to an open wound last week. Overall I feel like there is new tissue growth and I am very excited that this in 1 week is doing so well. 06/05/2019 on evaluation today patient appears to be doing actually fairly well in regard to her toe ulcer. Unfortunately she does have a skin tear on the anterior surface of her right lower extremity which unfortunately is a little bit rolled under on the edge of the skin tear. Nonetheless I think we may be able to straighten this out and if so that would go a long way to help in this area to heal more effectively and quickly. Fortunately there is no signs of active infection at this time. She does have some swelling but again I think this is secondary to the skin tear as opposed to anything else. 06/12/2019 on evaluation today patient appears to be doing about the same in regard to the toe. With that being said she is having issues with the Steri-Strips having come off on her leg and there was some skin that actually necrosis and did slough off at this point. Fortunately there is no evidence of active infection at this time. She is very concerned about the possibility of amputation she states her daughter is also very concerned. With that being said I have not seen her daughter up to this point. Also have not really realized until today that the patient may have a component of dementia that may be preventing complete compliance and remembering of what needs to be done as far as taking care of her wound. 06/19/2019 on evaluation today patient appears to be doing well with regard to her lower  extremity ulcer the toe ulcer is measuring better size wise but does have a little bit more depth. Again I do think we may want proceed with an x- ray at this point in order to further evaluate this and see if there is anything we need to do to aid in getting this to heal more appropriately and quickly. Fortunately there is no signs of systemic infection at this time. 06/26/2019 upon evaluation today patient actually appears to be doing quite well with regard to her lower extremity ulcer and her toe ulcer. I did prescribe her doxycycline in the interim since I last saw her last week and this week. She contacted the office on Friday and wanted to see about getting something at that point based on the results of the x-ray finding. There did not appear to be any signs of osteomyelitis but there was some evidence potentially of infection. Nonetheless I did send this in for her. 2/Hunter; patient was worked in the clinic today after calling with severe pain in the right great toe. She had an x-ray done last week that was negative. She was put on doxycycline because of possible cellulitis. There is significant erythema in the dorsal aspect of the right great toe. The remaining wound is on the plantar aspect of the interphalangeal joint of the right great toe. Other wounds  that were on the lower extremities seem healed. We have been using Iodoflex to the wound on the right great toe The patient has a penicillin allergy dating back into her early 28s at that point in time she developed swelling in the foot and ataxia of gait. She did not have a rash, anaphylactoid issues as far as I can tell and as far she remembers 07/03/19 upon evaluation today patient appears to be doing still poorly in regard to her great toe ulcer. Unfortunately she tells me that she is still having discomfort she did have to see Dr. [name] due to things not doing so well on the 15th which was just a couple of days ago. He did switcher at  that time as far as the antibiotics were concerned to a different oral antibiotic. This was Keflex. With that being said she maybe does appear to be doing somewhat better though she does have a small abscess area in the toe I am still really feeling like there may be something greater going on here I think the patient may need to have them ride to further evaluate the situation. 2/23; patient seems to alternate clinics on a weekly basis. I note that she had an MRI showing a soft tissue ulceration of the medial plantar base of the great toe with underlying early osteomyelitis of the first distal phalanx. I gave her Keflex a week or so ago for what looked to be cellulitis of the right great toe resistant to doxycycline and this looks a lot better. We have been using silver alginate. 3/2; the patient had lab work done unfortunately she spent a long time in the ER for it which was really not necessary. Her creatinine was 1.03 white count 3.5 hemoglobin 11.9 differential count essentially normal. Sedimentation rate at 32 BMP otherwise normal. C-reactive protein was not elevated at 0.6. The patient started on Levaquin last week she had already had a week of Keflex and perhaps doxycycline before this. My goal is that give her a least 3 or 4 weeks of Levaquin. She is tolerating this well without complaints 3/9; the patient is tolerating the Levaquin well. She will probably need at least another 2 or 3 weeks of this. Wound on the right medial toe looks better. Using silver alginate but I changed to silver collagen today She also scratched herself while putting on her stockings with a nail. She has a new wound on the right medial calf Objective Constitutional Patient is hypotensive. However appears well. Pulse regular and within target range for patient.Marland Kitchen Respirations regular, non-labored and within target range.. Temperature is normal and within the target range for the patient.Marland Kitchen Appears in no  distress. Vitals Time Taken: 11:40 AM, Height: 66 in, Weight: 164 lbs, BMI: 26.5, Temperature: 97.5 F, Pulse: 63 bpm, Respiratory Rate: 18 breaths/min, Blood Pressure: 99/37 mmHg, Capillary Blood Glucose: 121 mg/dl. General Notes: glucose per pt report Cardiovascular Pedal pulses palpable. Severe chronic venous insufficiency. General Notes: Wound exam; the wound looks somewhat better. Rolled edges around the wound debrided with a #5 curette removing skin and subcutaneous tissue from around the circumference of the wound. There is no evidence of cellulitis the swelling in the toe is getting better. No tenderness over the inner phalangeal joint Integumentary (Hair, Skin) Wound #2 status is Open. Original cause of wound was Blister. The wound is located on the [employer]. The wound measures 0.9cm length x 1.2cm width x 0.3cm depth; 0.848cm^2 area and 0.254cm^3 volume. There is  Fat Layer (Subcutaneous Tissue) Exposed exposed. There is no tunneling noted, however, there is undermining starting at 12:00 and ending at 6:00 with a maximum distance of 0.4cm. There is a small amount of serosanguineous drainage noted. The wound margin is epibole. There is large (67-100%) pink granulation within the wound bed. There is a small (1-33%) amount of necrotic tissue within the wound bed including Adherent Slough. Wound #4 status is Open. Original cause of wound was Trauma. The wound is located on the Right,Medial Lower Leg. The wound measures 1.2cm length x 1cm width x 0.1cm depth; 0.942cm^2 area and 0.094cm^3 volume. There is Fat Layer (Subcutaneous Tissue) Exposed exposed. There is no tunneling or undermining noted. There is a medium amount of serosanguineous drainage noted. The wound margin is flat and intact. There is large (67-100%) red granulation within the wound bed. There is no necrotic tissue within the wound bed. 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 There was a Excisional Skin/Subcutaneous Tissue Debridement with a total area of 1.08 sq cm performed by Ricard Dillon., MD. With the following instrument(s): Curette to remove Viable and Non-Viable tissue/material. Material removed includes Callus, Subcutaneous Tissue, Slough, Skin: Dermis, and Skin: Epidermis after achieving pain control using Lidocaine 5% topical ointment. No specimens were taken. A time out was conducted at 12:18, prior to the start of the procedure. A Moderate amount of bleeding was controlled with Silver Nitrate. The procedure was tolerated well with a pain level of 0 throughout and a pain level of 0 following the procedure. Post Debridement Measurements: 0.9cm length x 1.2cm width x 0.3cm depth; 0.254cm^3 volume. Character of Wound/Ulcer Post Debridement is improved. Severity of Tissue Post Debridement is: Fat layer exposed. Post procedure Diagnosis Wound #2: Same as Pre-Procedure Plan Follow-up Appointments: Return Appointment in 1 week. - TUESDAY Dressing Change Frequency: Wound #2 Right,Medial Toe Great: Change Dressing every other day. Skin Barriers/Peri-Wound Care: Moisturizing lotion - to right leg Wound Cleansing: Wound #2 Right,Medial Toe Great: Clean wound with Wound Cleanser Primary Wound Dressing: Wound #2 Right,Medial Toe Great: Silver Collagen -  moisten with normal saline Wound #4 Right,Medial Lower Leg: Silver Collagen - moisten with normal saline Secondary Dressing: Wound #2 Right,Medial Toe Great: Kerlix/Rolled Gauze Dry Gauze Wound #4 Right,Medial Lower Leg: Kerlix/Rolled Gauze Dry Gauze Edema Control: Avoid standing for long periods of time Elevate legs to the level of the heart or above for 30 minutes daily and/or when sitting, a frequency of: - whenever sitting throughout the day Exercise regularly Support Garment 20-30 mm/Hg pressure to: - to both legs daily Off-Loading: Open toe surgical shoe to: - RIGHT FOOT 1. Change the primary dressing to silver collagen 2. The patient had a new wound on the medial right calf which was injury caused by her nail when removing her stockings. We will use silver collagen here as well 3. Curlex and rolled gauze Electronic Signature(s) Signed: 07/23/2019 5:48:31 PM By: Linton Ham MD Entered By: Linton Ham on 07/23/2019 13:01:48 -------------------------------------------------------------------------------- SuperBill  Details Patient Name: Date of Service: Tonya Hunter, Tonya Hunter 07/23/2019 Medical Record [id number] Patient Account Number: [id number] Date of Birth/Sex: Treating RN: Tonya [birth year] (85 y.o. [name] Primary Care Provider: [name] Other Clinician: Referring Provider: Treating Provider/Extender:Caroll Cunnington, [name], [name] in Treatment: 8 Diagnosis Coding ICD-10 Codes Code Description I87.2 Venous insufficiency (chronic) (peripheral) L97.512 Non-pressure chronic ulcer of other part of right foot with fat layer exposed S81.801A Unspecified open wound, right lower leg, initial encounter E11.622 Type 2 diabetes mellitus with other skin ulcer M86.671 Other chronic osteomyelitis, right ankle and foot I50.42 Chronic combined systolic (congestive) and diastolic (congestive) heart failure I25.10 Atherosclerotic heart disease of native  coronary artery without angina pectoris Facility Procedures CPT4 Code Description: [id number] - DEB SUBQ TISSUE 20 SQ CM/< ICD-10 Diagnosis Description L97.512 Non-pressure chronic ulcer of other part of right foot wit Modifier: h fat layer e Quantity: 1 xposed Physician Procedures CPT4 Code Description: [id number] - WC PHYS SUBQ TISS 20 SQ CM ICD-10 Diagnosis Description L97.512 Non-pressure chronic ulcer of other part of right foot wit Modifier: h fat layer e Quantity: 1 xposed Electronic Signature(s) Signed: 07/23/2019 5:48:31 PM By: Linton Ham MD Entered By: Linton Ham on 07/23/2019 13:02:17

## 2019-07-25 ENCOUNTER — Ambulatory Visit: Payer: Medicare Other | Admitting: Physician Assistant

## 2019-07-26 ENCOUNTER — Encounter: Payer: Self-pay | Admitting: Physical Therapy

## 2019-07-26 ENCOUNTER — Ambulatory Visit: Payer: Medicare Other | Admitting: Physical Therapy

## 2019-07-26 ENCOUNTER — Other Ambulatory Visit: Payer: Self-pay

## 2019-07-26 DIAGNOSIS — R2689 Other abnormalities of gait and mobility: Secondary | ICD-10-CM

## 2019-07-26 DIAGNOSIS — R262 Difficulty in walking, not elsewhere classified: Secondary | ICD-10-CM | POA: Diagnosis not present

## 2019-07-26 DIAGNOSIS — R2681 Unsteadiness on feet: Secondary | ICD-10-CM | POA: Diagnosis not present

## 2019-07-26 DIAGNOSIS — M6281 Muscle weakness (generalized): Secondary | ICD-10-CM
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
## 2019-07-29 NOTE — Progress Notes (Signed)
Tonya, Hunter ([id number]) Visit Report for 07/23/2019 Arrival Information Details Patient Name: Date of Service: Tonya Hunter, Tonya Hunter 07/23/2019 10:45 AM Medical Record [id number] Patient Account Number: [id number] Date of Birth/Sex: Treating RN: Sep 13, 1934 (85 y.o. Tonya Sprague, Briant Hunter Primary Care Laekyn Rayos: [name] Other Clinician: Referring Marisah Laker: Treating Javonte Elenes/Extender:[name], [name], [name] in Treatment: 8 Visit Information History Since Last Visit Added or deleted any medications: No Patient Arrived: Tonya Hunter Any new allergies or adverse reactions: No Arrival Time: 11:39 Had a fall or experienced change in No Accompanied By: alone activities of daily living that may affect Transfer Assistance: None risk of falls: Patient Identification Verified: Yes Signs or symptoms of abuse/neglect since last No Secondary Verification Process Yes visito Completed: Hospitalized since last visit: No Patient Requires Transmission-Based No Implantable device outside of the clinic excluding No Precautions: cellular tissue based products placed in the center Patient Has Alerts: Yes since last visit: Patient Alerts: Patient on Blood Has Dressing in Place as Prescribed: Yes Thinner Pain Present Now: No L ABI non compressible Electronic Signature(s) Signed: 07/29/2019 5:44:18 PM By: Levan Hurst RN, BSN Entered By: Levan Hurst on 07/23/2019 11:39:29 -------------------------------------------------------------------------------- Encounter Discharge Information Details Patient Name: Date of Service: [name] 07/23/2019 10:45 AM Medical Record [id number] Patient Account Number: [id number] Date of Birth/Sex: Treating RN: Nov 16, 1934 (85 y.o. [name] Primary Care Paxtyn Boyar: [name] Other Clinician: Referring Ethel Meisenheimer: Treating Leiyah Maultsby/Extender:[name], [name], [name] in Treatment: 8 Encounter  Discharge Information Items Post Procedure Vitals Discharge Condition: Stable Temperature (F): 97.5 Ambulatory Status: Walker Pulse (bpm): 63 Discharge Destination: Home Respiratory Rate (breaths/min): 18 Transportation: Private Auto Blood Pressure (mmHg): 99/37 Accompanied By: self Schedule Follow-up Appointment: Yes Clinical Summary of Care: Patient Declined Electronic Signature(s) Signed: 07/23/2019 5:46:17 PM By: Kela Millin Entered By: Kela Millin on 07/23/2019 12:33:59 -------------------------------------------------------------------------------- Lower Extremity Assessment Details Patient Name: Date of Service: Tonya, Hunter 07/23/2019 10:45 AM Medical Record [id number] Patient Account Number: [id number] Date of Birth/Sex: Treating RN: May 11, 1935 (85 y.o. [name] Primary Care Prudie Guthridge: [name] Other Clinician: Referring Karsen Nakanishi: Treating Hisayo Delossantos/Extender:[name], [name], [name] Weeks in Treatment: 8 Edema Assessment Assessed: [Left: No] [Right: No] Edema: [Left: Ye] [Right: s] Calf Left: Right: Point of Measurement: 31 cm From Medial Instep cm 35 cm Ankle Left: Right: Point of Measurement: 9 cm From Medial Instep cm 21.5 cm Vascular Assessment Pulses: Dorsalis Pedis Palpable: [Right:Yes] Electronic Signature(s) Signed: 07/29/2019 5:44:18 PM By: Levan Hurst RN, BSN Entered By: Levan Hurst on 07/23/2019 11:43:12 -------------------------------------------------------------------------------- Multi Wound Chart Details Patient Name: Date of Service: [name] 07/23/2019 10:45 AM Medical Record [id number] Patient Account Number: [id number] Date of Birth/Sex: Treating RN: 08-02-1934 (85 y.o. 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, Other Lower Extremity] [N/A:N/A] Comorbid History: [2:Cataracts, Anemia, Angina, Cataracts, Anemia, Angina, N/A Congestive Heart Failure, Congestive Heart Failure, Coronary Artery Disease, Coronary Artery Disease, Hypertension, Myocardial Hypertension, Myocardial Infarction, Type II  Diabetes, Infarction, Type II Diabetes, Osteoarthritis, Neuropathy Osteoarthritis, Neuropathy] Date Acquired: [2:05/22/2019] [4:07/22/2019] [N/A:N/A] Weeks of Treatment: [2:8] [4:0] [N/A:N/A] Wound Status: [  2:Open] [4:Open] [N/A:N/A] Measurements L x W x D 0.9x1.2x0.3 [4:1.2x1x0.1] [N/A:N/A] (cm) Area (cm) : [2:0.848] [4:0.942] [N/A:N/A] Volume (cm) : [2:0.254] [4:0.094] [N/A:N/A] % Reduction in Area: [2:88.30%] [4:N/A] [N/A:N/A] % Reduction in Volume: 64.90% [4:N/A] [N/A:N/A] Starting Position 1 12 (o'clock): Ending Position 1 [2:6] (o'clock): Maximum Distance 1 [2:0.4] (cm): Undermining: [2:Yes] [4:No] [N/A:N/A] Classification: [2:Grade 2] [4:Full Thickness Without Exposed Support Structures] [N/A:N/A] Exudate Amount: [2:Small] [4:Medium] [N/A:N/A] Exudate Type: [2:Serosanguineous] [4:Serosanguineous] [N/A:N/A] Exudate Color: [2:red, brown] [4:red, brown] [N/A:N/A] Wound Margin: [2:Epibole] [4:Flat and Intact] [N/A:N/A] Granulation Amount: [2:Large (67-100%)] [4:Large (67-100%)] [N/A:N/A] Granulation Quality: [2:Pink] [4:Red] [N/A:N/A] Necrotic Amount: [2:Small (1-33%)] [4:None Present (0%)] [N/A:N/A] Exposed Structures: [2:Fat Layer (Subcutaneous Fat Layer (Subcutaneous N/A Tissue) Exposed: Yes  Fascia: No Tendon: No Muscle: No Joint: No Bone: No] [4:Tissue) Exposed: Yes Fascia: No Tendon: No Muscle: No Joint: No Bone: No] Epithelialization: [2:Small (1-33%)] [4:None] [N/A:N/A] Debridement: [2:Debridement - Excisional N/A] [N/A:N/A] Pre-procedure [2:12:18] [4:N/A] [N/A:N/A] Verification/Time Out Taken: Pain Control: [2:Lidocaine 5% topical ointment] [4:N/A] [N/A:N/A] Tissue Debrided: [2:Callus, Subcutaneous, Slough] [4:N/A] [N/A:N/A] Level: [2:Skin/Subcutaneous Tissue] [4:N/A] [N/A:N/A] Debridement Area (sq cm):1.08 [4:N/A] [N/A:N/A] Instrument: [2:Curette] [4:N/A] [N/A:N/A] Bleeding: [2:Moderate] [4:N/A] [N/A:N/A] Hemostasis Achieved: [2:Silver Nitrate] [4:N/A] [N/A:N/A] Procedural Pain: [2:0] [4:N/A] [N/A:N/A] Post Procedural Pain: [2:0] [4:N/A] [N/A:N/A] Debridement Treatment Procedure was tolerated [4:N/A] [N/A:N/A] Response: [2:well] Post Debridement [2:0.9x1.2x0.3] [4:N/A] [N/A:N/A] Measurements L x W x D (cm) Post Debridement [2:0.254] [4:N/A] [N/A:N/A] Volume: (cm) Procedures Performed: Debridement [4:N/A] [N/A:N/A] Treatment Notes Wound #2 (Right, Medial Toe Great) 1. Cleanse With Wound Cleanser 3. Primary Dressing Applied Collegen AG Hydrogel or K-Y Jelly 4. Secondary Dressing Dry Gauze Roll Gauze Notes netting Wound #4 (Right, Medial Lower Leg) 1. Cleanse With Wound Cleanser 3. Primary Dressing Applied Collegen AG Hydrogel or K-Y Jelly 4. Secondary Dressing Dry Gauze Roll Gauze Notes netting Electronic Signature(s) Signed: 07/23/2019 5:48:31 PM By: Linton Ham MD Signed: 07/24/2019 7:20:02 AM By: [name] RN Entered By: Linton Ham on 07/23/2019 12:59:13 -------------------------------------------------------------------------------- Multi-Disciplinary Care Plan Details Patient Name: Date of Service: ZRIYAH, KOPPLIN 07/23/2019 10:45 AM Medical Record [id number] Patient Account Number: [id number] Date of Birth/Sex: Treating  RN: 06/26/1934 (85 y.o. Tonya Hunter Primary Care : [name] Other Clinician: Referring : Treating /Extender:[name], [name], [name] in Treatment: 8 Active Inactive Wound/Skin Impairment Nursing Diagnoses: Impaired tissue integrity Knowledge deficit related to ulceration/compromised skin integrity Goals: Patient/caregiver will verbalize understanding of skin care regimen Date Initiated: 05/22/2019 Target Resolution Date: 08/16/2019 Goal Status: Active Ulcer/skin breakdown will have a volume reduction of 30% by week 4 Date Initiated: 05/22/2019 Date Inactivated: 06/19/2019 Target Resolution Date: 06/19/2019 Goal Status: Met Ulcer/skin breakdown will have a volume reduction of 50% by week 8 Date Initiated: 06/19/2019 Target Resolution Date: 07/17/2019 Goal Status: Active Interventions: Assess patient/caregiver ability to obtain necessary supplies Assess patient/caregiver ability to perform ulcer/skin care regimen upon admission and as needed Assess ulceration(s) every visit Provide education on ulcer and skin care Treatment Activities: Skin care regimen initiated : 05/22/2019 Topical wound management initiated : 05/22/2019 Notes: Electronic Signature(s) Signed: 07/24/2019 7:20:02 AM By: [name] RN Entered By: [name] on 07/23/2019 11:13:10 -------------------------------------------------------------------------------- Pain Assessment Details Patient Name: Date of Service: BRECKLYN, GALVIS 07/23/2019 10:45 AM Medical Record [id number] Patient Account Number: [id number] Date of Birth/Sex: Treating RN: 1935/04/20 (85 y.o. [name] Primary Care : [name] Other Clinician: Referring : Treating /Extender:[name], [name], Richard Janett Billow in Treatment: 8 Active Problems Location of Pain Severity and Description of  Pain Patient Has Paino No Site Locations Pain Management and  Medication Current Pain Management: Electronic Signature(s) Signed: 07/29/2019 5:44:18 PM By: Levan Hurst RN, BSN Entered By: Levan Hurst on 07/23/2019 11:41:51 -------------------------------------------------------------------------------- Patient/Caregiver Education Details Patient Name: Date of Service: [name] 3/9/2021andnbsp10:45 AM Medical Record [id number] Patient Account Number: [id number] Date of Birth/Gender: Treating RN: 1935-04-26 (84 y.o. Tonya Hunter Primary Care Physician: [name] Other Clinician: Referring Physician: Treating Physician/Extender:[name], [name], [name] in Treatment: 8 Education Assessment Education Provided To: Patient Education Topics Provided Wound/Skin Impairment: Methods: Explain/Verbal Responses: State content correctly Electronic Signature(s) Signed: 07/24/2019 7:20:02 AM By: [name] RN Entered By: [name] on 07/23/2019 11:13:30 -------------------------------------------------------------------------------- Wound Assessment Details Patient Name: Date of Service: CARIME, DINKEL 07/23/2019 10:45 AM Medical Record [id number] Patient Account Number: [id number] Date of Birth/Sex: Treating RN: [birth year] (85 y.o. Tonya Hunter Primary Care : [name] Other Clinician: Referring : Treating /Extender:[name], [name], [name] Weeks in Treatment: 8 Wound Status Wound Number: 2 Primary Diabetic Wound/Ulcer of the Lower Extremity Etiology: Wound Location: Right Toe Great - Medial Wound Open Wounding Event: Blister Status: Date Acquired: 05/22/2019 Comorbid Cataracts, Anemia, Angina, Congestive Heart Weeks Of Treatment: 8 History: Failure, Coronary Artery Disease, Clustered Wound: No Hypertension, Myocardial Infarction, Type II Diabetes, Osteoarthritis, Neuropathy Photos Wound Measurements Length: (cm) 0.9 Width: (cm)  1.2 Depth: (cm) 0.3 Area: (cm) 0.848 Volume: (cm) 0.254 % Reduction in Area: 88.3% % Reduction in Volume: 64.9% Epithelialization: Small (1-33%) Tunneling: No Undermining: Yes Starting Position (o'clock): 12 Ending Position (o'clock): 6 Maximum Distance: (cm) 0.4 Wound Description Classification: Grade 2 Foul Odo Wound Margin: Epibole Slough/F Exudate Amount: Small Exudate Type: Serosanguineous Exudate Color: red, brown Wound Bed Granulation Amount: Large (67-100%) Granulation Quality: Pink Fascia Expo Necrotic Amount: Small (1-33%) Fat Layer ( Necrotic Quality: Adherent Slough Tendon Expo Muscle Expo Joint Expos Bone Expose r After Cleansing: No ibrino Yes Exposed Structure sed: No Subcutaneous Tissue) Exposed: Yes sed: No sed: No ed: No d: No Treatment Notes Wound #2 (Right, Medial Toe Great) 1. Cleanse With Wound Cleanser 3. Primary Dressing Applied Collegen AG Hydrogel or K-Y Jelly 4. Secondary Dressing Dry Gauze Roll Gauze Notes netting Electronic Signature(s) Signed: 07/24/2019 2:27:25 PM By: Mikeal Hawthorne EMT/HBOT Signed: 07/24/2019 5:35:23 PM By: [name] RN Entered By: Mikeal Hawthorne on 07/24/2019 13:18:53 -------------------------------------------------------------------------------- Wound Assessment Details Patient Name: Date of Service: [name] 07/23/2019 10:45 AM Medical Record [id number] Patient Account Number: [id number] Date of Birth/Sex: Treating RN: [birth year] (85 y.o. Tonya Hunter Primary Care : [name] Other Clinician: Referring : Treating /Extender:[name], [name], [name] Weeks in Treatment: 8 Wound Status Wound Number: 4 Primary Trauma, Other Etiology: Wound Location: Right Lower Leg - Medial Wound Open Wounding Event: Trauma Status: Date Acquired: 07/22/2019 Comorbid Cataracts, Anemia, Angina, Congestive Heart Weeks Of Treatment: 0 History: Failure,  Coronary Artery Disease, Clustered Wound: No Hypertension, Myocardial Infarction, Type II Diabetes, Osteoarthritis, Neuropathy Photos Wound Measurements Length: (cm) 1.2 % Reduct Width: (cm) 1 % Reduct Depth: (cm) 0.1 Epitheli Area: (cm) 0.942 Tunneli Volume: (cm) 0.094 Undermi Wound Description Classification: Full Thickness Without Exposed Support Foul Odo Structures Slough/F Wound Flat and Intact Margin: Exudate Medium Amount: Exudate Serosanguineous Type: Exudate red, brown Color: Wound Bed Granulation Amount: Large (67-100%) Granulation Quality: Red Fascia E Necrotic Amount: None Present (0%) Fat Laye Tendon E Muscle E Joint Ex Bone Exp r After Cleansing: No ibrino No Exposed Structure xposed: No r (Subcutaneous  Tissue) Exposed: Yes xposed: No xposed: No posed: No osed: No ion in Area: 0% ion in Volume: 0% alization: None ng: No ning: No Treatment Notes Wound #4 (Right, Medial Lower Leg) 1. Cleanse With Wound Cleanser 3. Primary Dressing Applied Collegen AG Hydrogel or K-Y Jelly 4. Secondary Dressing Dry Gauze Roll Gauze Notes netting Electronic Signature(s) Signed: 07/24/2019 2:27:25 PM By: Mikeal Hawthorne EMT/HBOT Signed: 07/24/2019 5:35:23 PM By: [name] RN Entered By: Mikeal Hawthorne on 07/24/2019 13:20:31 -------------------------------------------------------------------------------- Vitals Details Patient Name: Date of Service: [name] 07/23/2019 10:45 AM Medical Record [id number] Patient Account Number: [id number] Date of Birth/Sex: Treating RN: [birth year] (85 y.o. [name] Primary Care : [name] Other Clinician: Referring : Treating /Extender:[name], [name], [name] Weeks in Treatment: 8 Vital Signs Time Taken: 11:40 Temperature (F): 97.5 Height (in): 66 Pulse (bpm): 63 Weight (lbs): 164 Respiratory Rate (breaths/min): 18 Body Mass Index (BMI):  26.5 Blood Pressure (mmHg): 99/37 Capillary Blood Glucose (mg/dl): 121 Reference Range: 80 - 120 mg / dl Notes glucose per pt report Electronic Signature(s) Signed: 07/29/2019 5:44:18 PM By: Levan Hurst RN, BSN Entered By: Levan Hurst on 07/23/2019 11:41:09

## 2019-07-30 ENCOUNTER — Encounter (HOSPITAL_BASED_OUTPATIENT_CLINIC_OR_DEPARTMENT_OTHER): Payer: Medicare Other | Admitting: Internal Medicine

## 2019-07-30 ENCOUNTER — Other Ambulatory Visit: Payer: Self-pay

## 2019-07-30 DIAGNOSIS — E11621 Type 2 diabetes mellitus with foot ulcer: Secondary | ICD-10-CM | POA: Diagnosis not present

## 2019-07-30 DIAGNOSIS — E11622 Type 2 diabetes mellitus with other skin ulcer: Secondary | ICD-10-CM | POA: Diagnosis not present

## 2019-07-30 DIAGNOSIS — L97512 Non-pressure chronic ulcer of other part of right foot with fat layer exposed: Secondary | ICD-10-CM | POA: Diagnosis not present

## 2019-07-30 DIAGNOSIS — M86671 Other chronic osteomyelitis, right ankle and foot: Secondary | ICD-10-CM | POA: Diagnosis not present

## 2019-07-30 DIAGNOSIS — Z7902 Long term (current) use of antithrombotics/antiplatelets: Secondary | ICD-10-CM | POA: Diagnosis not present

## 2019-07-30 DIAGNOSIS — Z7982 Long term (current) use of aspirin: Secondary | ICD-10-CM | POA: Diagnosis not present

## 2019-07-30 DIAGNOSIS — I872 Venous insufficiency (chronic) (peripheral): Secondary | ICD-10-CM | POA: Diagnosis not present

## 2019-07-31 NOTE — Progress Notes (Signed)
Tonya Hunter, Tonya Hunter ([id number]) Visit Report for 07/30/2019 Arrival Information Details Patient Name: Date of Service: Tonya Hunter, Tonya Hunter 07/30/2019 10:45 AM Medical Record [id number] Patient Account Number: [id number] Date of Birth/Sex: Treating RN: 1934/12/28 (85 y.o. Hollie Salk, Larene Beach Primary Care Fujiko Picazo: [name] Other Clinician: Referring Birdie Beveridge: Treating Krrish Freund/Extender:[name], [name], [name] in Treatment: 9 Visit Information History Since Last Visit Added or deleted any medications: No Patient Arrived: [name] Any new allergies or adverse reactions: No Arrival Time: 11:08 Had a fall or experienced change in No Accompanied By: self activities of daily living that may affect Transfer Assistance: None risk of falls: Patient Identification Verified: Yes Signs or symptoms of abuse/neglect since last No Secondary Verification Process Yes visito Completed: Hospitalized since last visit: No Patient Requires Transmission-Based No Implantable device outside of the clinic excluding No Precautions: cellular tissue based products placed in the center Patient Has Alerts: Yes since last visit: Patient Alerts: Patient on Blood Has Dressing in Place as Prescribed: Yes Thinner Pain Present Now: No L ABI non compressible Electronic Signature(s) Signed: 07/30/2019 5:07:08 PM By: Kela Millin Entered By: Kela Millin on 07/30/2019 11:08:48 -------------------------------------------------------------------------------- Encounter Discharge Information Details Patient Name: Date of Service: Tonya Hunter 07/30/2019 10:45 AM Medical Record [id number] Patient Account Number: [id number] Date of Birth/Sex: Treating RN: [birth year] (85 y.o. [name] Primary Care Shreeya Recendiz: [name] Other Clinician: Referring Anatalia Kronk: Treating Hilary Pundt/Extender:[name], [name], [name] in Treatment: 9 Encounter  Discharge Information Items Post Procedure Vitals Discharge Condition: Stable Temperature (F): 98.3 Ambulatory Status: Walker Pulse (bpm): 80 Discharge Destination: Home Respiratory Rate (breaths/min): 18 Transportation: Private Auto Blood Pressure (mmHg): 112/83 Accompanied By: self Schedule Follow-up Appointment: Yes Clinical Summary of Care: Patient Declined Electronic Signature(s) Signed: 07/30/2019 5:07:08 PM By: Kela Millin Entered By: Kela Millin on 07/30/2019 13:16:30 -------------------------------------------------------------------------------- Lower Extremity Assessment Details Patient Name: Date of Service: Tonya Hunter, Tonya Hunter 07/30/2019 10:45 AM Medical Record [id number] Patient Account Number: [id number] Date of Birth/Sex: Treating RN: 04-Jul-1934 (85 y.o. [name] Primary Care Alyssha Housh: [name] Other Clinician: Referring Bevin Das: Treating Lavin Petteway/Extender:[name], [name], [name] Weeks in Treatment: 9 Edema Assessment Assessed: [Left: No] [Right: No] Edema: [Left: Ye] [Right: s] Calf Left: Right: Point of Measurement: 31 cm From Medial Instep cm 37 cm Ankle Left: Right: Point of Measurement: 9 cm From Medial Instep cm 27 cm Vascular Assessment Pulses: Dorsalis Pedis Palpable: [Right:Yes] Electronic Signature(s) Signed: 07/30/2019 5:07:08 PM By: Kela Millin Entered By: Kela Millin on 07/30/2019 11:11:07 -------------------------------------------------------------------------------- Multi Wound Chart Details Patient Name: Date of Service: Tonya Hunter 07/30/2019 10:45 AM Medical Record [id number] Patient Account Number: [id number] Date of Birth/Sex: Treating RN: 05/30/1934 (85 y.o. 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, Other Lower Extremity] [5:Diabetic Wound/Ulcer of the Lower Extremity] Comorbid History: [2:Cataracts, Anemia, Angina, Cataracts, Anemia, Angina, Cataracts, Anemia, Angina, Congestive Heart Failure, Congestive Heart Failure, Congestive Heart Failure, Coronary Artery Disease, Coronary Artery Disease, Coronary Artery Disease,  Hypertension, Myocardial Hypertension, Myocardial Hypertension, Myocardial Infarction, Type II Diabetes, Infarction, Type II Diabetes, Infarction, Type II Diabetes, Osteoarthritis, Neuropathy Osteoarthritis,  Neuropathy Osteoarthritis, Neuropathy] Date Acquired: [2:05/22/2019] [4:07/22/2019] [5:07/28/2019] Weeks of Treatment: [2:9] [4:1] [5:0] Wound Status: [2:Open] [4:Open] [5:Open] Measurements L x W x D 1x2.6x0.6 [4:1.2x1x0.1] [5:0.6x0.5x0.1] (cm) Area (cm) : [2:2.042] [4:0.942] [5:0.236] Volume (cm) : [2:1.225] [4:0.094] [5:0.024] % Reduction in Area: [2:71.70%] [4:0.00%] [5:N/A] % Reduction in Volume: -69.40% [4:0.00%] [5:N/A] Classification: [2:Grade 2] [4:Full Thickness Without Exposed Support Structures] [5:Grade 2] Exudate Amount: [2:Small] [4:Medium] [5:Medium] Exudate Type: [2:Serosanguineous] [4:Serosanguineous] [5:Serosanguineous] Exudate Color: [2:red, brown] [4:red, brown] [5:red, brown] Wound Margin: [2:Epibole] [4:Flat and Intact] [5:Flat and Intact] Granulation Amount: [2:Large (67-100%)] [4:Large (67-100%)] [5:Medium (34-66%)] Granulation Quality: [2:Pink] [4:Red] [5:Red, Pink] Necrotic Amount: [2:Small (1-33%)] [4:None Present (0%)] [5:Medium  (34-66%)] Exposed Structures: [2:Fat Layer (Subcutaneous Fat Layer (Subcutaneous Fat Layer (Subcutaneous Tissue) Exposed: Yes Fascia: No Tendon: No Muscle: No Joint: No Bone: No] [4:Tissue) Exposed: Yes Fascia: No Tendon: No Muscle: No Joint: No Bone: No] [5:Tissue) Exposed: Yes  Fascia: No Tendon: No Muscle: No Joint: No Bone: No] Epithelialization: [2:Small (1-33%)] [4:None] [5:None] Debridement: [2:Debridement - Selective/Open Wound] [4:N/A] [5:N/A] Pre-procedure [2:11:41] [4:N/A] [5:N/A] Verification/Time Out Taken: Pain Control: [2:Lidocaine 5% topical ointment] [4:N/A] [5:N/A] Tissue Debrided: [2:Callus] [4:N/A] [5:N/A] Level: [2:Skin/Epidermis] [4:N/A] [5:N/A] Debridement Area (sq cm):2.6 [4:N/A] [5:N/A] Instrument: [2:Blade, Forceps] [4:N/A] [5:N/A] Bleeding: [2:Minimum] [4:N/A] [5:N/A] Hemostasis Achieved: [2:Pressure] [4:N/A] [5:N/A] Procedural Pain: [2:1] [4:N/A] [5:N/A] Post Procedural Pain: [2:0] [4:N/A] [5:N/A] Debridement Treatment Procedure was tolerated [4:N/A] [5:N/A] Response: [2:well] Post Debridement [2:1x2.6x0.6] [4:N/A] [5:N/A] Measurements L x W x D (cm) Post Debridement [2:1.225] [4:N/A] [5:N/A] Volume: (cm) Procedures Performed: Debridement [4:N/A] [5:N/A] Treatment Notes Wound #2 (Right, Medial Toe Great) 1. Cleanse With Wound Cleanser 3. Primary Dressing Applied Calcium Alginate Ag 4. Secondary Dressing Dry Gauze Roll Gauze 5. Secured With Tape Wound #4 (Right, Medial Lower Leg) 1. Cleanse With Wound Cleanser 3. Primary Dressing Applied Calcium Alginate Ag 4. Secondary Dressing Dry Gauze Roll Gauze 5. Secured With Tape Wound #5 (Right, Medial Toe Great) 1. Cleanse With Wound Cleanser 3. Primary Dressing Applied Calcium Alginate Ag 4. Secondary Dressing Dry Gauze Roll Gauze 5. Secured With Recruitment consultant) Signed: 07/30/2019 5:29:05 PM By: Linton Ham MD Signed: 07/31/2019 8:57:53 AM By: [name] RN Entered By:  Linton Ham on 07/30/2019 12:23:41 -------------------------------------------------------------------------------- Multi-Disciplinary Care Plan Details Patient Name: Date of Service: Tonya Hunter, Tonya Hunter 07/30/2019 10:45 AM Medical Record [id number] Patient Account Number: [id number] Date of Birth/Sex: Treating RN: 11-01-1934 (85 y.o. [name] Primary Care Joannah Gitlin: [name] Other Clinician: Referring Stanisha Lorenz: Treating Jorita Bohanon/Extender:[name], [name], [name] in Treatment: 9 Active Inactive Wound/Skin Impairment Nursing Diagnoses: Impaired tissue integrity Knowledge deficit related to ulceration/compromised skin integrity Goals: Patient/caregiver will verbalize understanding of skin care regimen Date Initiated: 05/22/2019 Target Resolution Date: 08/16/2019 Goal Status: Active Ulcer/skin breakdown will have a volume reduction of 30% by week 4 Date Initiated: 05/22/2019 Date Inactivated: 06/19/2019 Target Resolution Date: 06/19/2019 Goal Status: Met Ulcer/skin breakdown will have a volume reduction of 50% by week 8 Date Initiated: 06/19/2019 Date Inactivated: 07/16/2019 Target Resolution Date: 07/17/2019 Goal Status: Met Ulcer/skin breakdown will have a volume reduction of 80% by week 12 Date Initiated: 07/30/2019 Target Resolution Date: 08/16/2019 Goal Status: Active Interventions: Assess patient/caregiver ability to obtain necessary supplies Assess patient/caregiver ability to perform ulcer/skin care regimen upon admission and as needed Assess ulceration(s) every visit Provide education on ulcer and skin care Treatment Activities: Skin care regimen initiated : 05/22/2019 Topical wound management initiated : 05/22/2019 Notes: Electronic Signature(s) Signed: 07/31/2019 8:57:53 AM By: [name]  RN Entered By: [name] on 07/30/2019 11:10:46 -------------------------------------------------------------------------------- Pain Assessment  Details Patient Name: Date of Service: Tonya Hunter, Tonya Hunter 07/30/2019 10:45 AM Medical Record [id number] Patient Account Number: [id number] Date of Birth/Sex: Treating RN: 1934/07/07 (85 y.o. [name] Primary Care Latiya Navia: [name] Other Clinician: Referring Ruchy Wildrick: Treating Keiffer Piper/Extender:[name], [name], [name] in Treatment: 9 Active Problems Location of Pain Severity and Description of Pain Patient Has Paino No Site Locations Pain Management and Medication Current Pain Management: Electronic Signature(s) Signed: 07/30/2019 5:07:08 PM By: Kela Millin Entered By: Kela Millin on 07/30/2019 11:16:32 -------------------------------------------------------------------------------- Patient/Caregiver Education Details Patient Name: Date of Service: Tonya Hunter 3/16/2021andnbsp10:45 AM Medical Record [id number] Patient Account Number: [id number] Date of Birth/Gender: Treating RN: 11/29/1934 (85 y.o. [name] Primary Care Physician: [name] Other Clinician: Referring Physician: Treating Physician/Extender:[name], [name], [name] in Treatment: 9 Education Assessment Education Provided To: Patient Education Topics Provided Wound/Skin Impairment: Methods: Explain/Verbal Responses: State content correctly Electronic Signature(s) Signed: 07/31/2019 8:57:53 AM By: [name] RN Entered By: [name] on 07/30/2019 11:11:01 -------------------------------------------------------------------------------- Wound Assessment Details Patient Name: Date of Service: Tonya Hunter, Tonya Hunter 07/30/2019 10:45 AM Medical Record [id number] Patient Account Number: [id number] Date of Birth/Sex: Treating RN: [birth year] (85 y.o. [name] Primary Care Hampton Wixom: [name] Other Clinician: Referring Aarit Kashuba: Treating Nelma Phagan/Extender:[name], [name], Richard  A Weeks in Treatment: 9 Wound Status Wound Number: 2 Primary Diabetic Wound/Ulcer of the Lower Extremity Etiology: Wound Location: Right Toe Great - Medial Wound Open Wounding Event: Blister Status: Date Acquired: 05/22/2019 Comorbid Cataracts, Anemia, Angina, Congestive Heart Weeks Of Treatment: 9 History: Failure, Coronary Artery Disease, Clustered Wound: No Hypertension, Myocardial Infarction, Type II Diabetes, Osteoarthritis, Neuropathy Wound Measurements Length: (cm) 1 Width: (cm) 2.6 Depth: (cm) 0.6 Area: (cm) 2.042 Volume: (cm) 1.225 Wound Description Classification: Grade 2 Wound Margin: Epibole Exudate Amount: Small Exudate Type: Serosanguineous Exudate Color: red, brown Wound Bed Granulation Amount: Large (67-100%) Granulation Quality: Pink Necrotic Amount: Small (1-33%) Necrotic Quality: Adherent Slough r After Cleansing: No ibrino Yes Exposed Structure posed: No (Subcutaneous Tissue) Exposed: Yes posed: No posed: No osed: No posed: No % Reduction in Area: 71.7% % Reduction in Volume: -69.4% Epithelialization: Small (1-33%) Tunneling: No Undermining: No Foul Odo Slough/F Fascia Ex Fat Layer Tendon Ex Muscle Ex Joint Exp Bone Ex Treatment Notes Wound #2 (Right, Medial Toe Great) 1. Cleanse With Wound Cleanser 3. Primary Dressing Applied Calcium Alginate Ag 4. Secondary Dressing Dry Gauze Roll Gauze 5. Secured With Recruitment consultant) Signed: 07/30/2019 5:07:08 PM By: Kela Millin Entered By: Kela Millin on 07/30/2019 11:16:10 -------------------------------------------------------------------------------- Wound Assessment Details Patient Name: Date of Service: Tonya Hunter, Tonya Hunter 07/30/2019 10:45 AM Medical Record [id number] Patient Account Number: [id number] Date of Birth/Sex: Treating RN: Jan 21, 1935 (85 y.o. [name] Primary Care Ruqaya Strauss: [name] Other Clinician: Referring Naya Ilagan:  Treating Louie Meaders/Extender:[name], [name], [name] Weeks in Treatment: 9 Wound Status Wound Number: 4 Primary Trauma, Other Etiology: Wound Location: Right Lower Leg - Medial Wound Open Wounding Event: Trauma Status: Date Acquired: 07/22/2019 Comorbid Cataracts, Anemia, Angina, Congestive Heart Weeks Of Treatment: 1 History: Failure, Coronary Artery Disease, Clustered Wound: No Hypertension, Myocardial Infarction, Type II Diabetes, Osteoarthritis, Neuropathy Wound Measurements Length: (cm) 1.2 % Reduct Width: (cm) 1 % Reduct Depth: (cm) 0.1 Epitheli Area: (cm) 0.942 Tunneli Volume: (cm) 0.094 Undermi Wound Description Classification: Full Thickness Without Exposed Support Foul Od Structures Slough/ Wound Flat and Intact Margin: Exudate Medium Amount: Exudate Serosanguineous  Serosanguineous Type: Exudate red, brown Color: Wound Bed Granulation Amount: Large (67-100%) Granulation Quality: Red Fascia Exp Necrotic Amount: None Present (0%) Fat Layer Tendon Exp Muscle Exp Joint Expo Bone Expos or After Cleansing: No Fibrino No Exposed Structure osed: No (Subcutaneous Tissue) Exposed: Yes osed: No osed: No sed: No ed: No ion in Area: 0% ion in Volume: 0% alization: None ng: No ning: No Treatment Notes Wound #4 (Right, Medial Lower Leg) 1. Cleanse With Wound Cleanser 3. Primary Dressing Applied Calcium Alginate Ag 4. Secondary Dressing Dry Gauze Roll Gauze 5. Secured With Recruitment consultant) Signed: 07/30/2019 5:07:08 PM By: Kela Millin Entered By: Kela Millin on 07/30/2019 11:16:23 -------------------------------------------------------------------------------- Wound Assessment Details Patient Name: Date of Service: Tonya Hunter, Tonya Hunter 07/30/2019 10:45 AM Medical Record [id number] Patient Account Number: [id number] Date of Birth/Sex: Treating RN: Dec 26, 1934 (85 y.o. [name] Primary Care Victorine Mcnee:  [name] Other Clinician: Referring Ferdinando Lodge: Treating Kareli Hossain/Extender:[name], [name], [name] Weeks in Treatment: 9 Wound Status Wound Number: 5 Primary Diabetic Wound/Ulcer of the Lower Extremity Etiology: Wound Location: Right Toe Great - Medial Wound Open Wounding Event: Gradually Appeared Status: Date Acquired: 07/28/2019 Comorbid Cataracts, Anemia, Angina, Congestive Heart Weeks Of Treatment: 0 History: Failure, Coronary Artery Disease, Clustered Wound: No Hypertension, Myocardial Infarction, Type II Diabetes, Osteoarthritis, Neuropathy Wound Measurements Length: (cm) 0.6 Width: (cm) 0.5 Depth: (cm) 0.1 Area: (cm) 0.236 Volume: (cm) 0.024 Wound Description Classification: Grade 2 Wound Margin: Flat and Intact Exudate Amount: Medium Exudate Type: Serosanguineous Exudate Color: red, brown Wound Bed Granulation Amount: Medium (34-66%) Granulation Quality: Red, Pink Necrotic Amount: Medium (34-66%) Necrotic Quality: Adherent Slough Foul Odor After Cleansing: No Slough/Fibrino Ye Exposed Structure Fascia Exposed: N Fat Layer (Subcutaneous Tissue) Exposed: Y Tendon Exposed: N Muscle Exposed: N Joint Exposed: N Bone Exposed: N % Reduction in Area: % Reduction in Volume: Epithelialization: None Tunneling: No Undermining: No s o es o o o o Treatment Notes Wound #5 (Right, Medial Toe Great) 1. Cleanse With Wound Cleanser 3. Primary Dressing Applied Calcium Alginate Ag 4. Secondary Dressing Dry Gauze Roll Gauze 5. Secured With Recruitment consultant) Signed: 07/31/2019 8:57:53 AM By: [name] RN Entered By: [name] on 07/30/2019 11:49:08 -------------------------------------------------------------------------------- Vitals Details Patient Name: Date of Service: Tonya Hunter 07/30/2019 10:45 AM Medical Record [id number] Patient Account Number: [id number] Date of Birth/Sex: Treating RN: 09-07-1934 (85  y.o. [name] Primary Care Yury Schaus: [name] Other Clinician: Referring Chaise Passarella: Treating Linkyn Gobin/Extender:[name], [name], [name] in Treatment: 9 Vital Signs Time Taken: 11:05 Temperature (F): 98.3 Height (in): 66 Pulse (bpm): 80 Weight (lbs): 164 Respiratory Rate (breaths/min): 18 Body Mass Index (BMI): 26.5 Blood Pressure (mmHg): 112/83 Reference Range: 80 - 120 mg / dl Electronic Signature(s) Signed: 07/30/2019 5:07:08 PM By: Kela Millin Entered By: Kela Millin on 07/30/2019 13:15:36

## 2019-07-31 NOTE — Progress Notes (Signed)
Tonya Hunter ([id number]) Visit Report for 07/30/2019 Debridement Details Patient Name: Date of Service: Tonya Hunter, Tonya Hunter 07/30/2019 10:45 AM Medical Record [id number] Patient Account Number: [id number] Date of Birth/Sex: Treating RN: 10/30/1934 (85 y.o. Tonya Hunter Primary Care Provider: Geoffery Hunter Other Clinician: Referring Provider: Treating Provider/Extender:Tonya Hunter, Tonya Hunter, Tonya Hunter in Treatment: 9 Debridement Performed for Wound #2 Right,Medial Toe Great Assessment: Performed By: Physician Tonya Hunter., MD Debridement Type: Debridement Severity of Tissue Pre Fat layer exposed Debridement: Level of Consciousness (Pre- Awake and Alert procedure): Pre-procedure Verification/Time Out Taken: Yes - 11:41 Start Time: 11:41 Pain Control: Lidocaine 5% topical ointment Total Area Debrided (L x W): 1 (cm) x 2.6 (cm) = 2.6 (cm) Tissue and other material Viable, Non-Viable, Callus, Skin: Dermis , Skin: Epidermis debrided: Level: Skin/Epidermis Debridement Description: Selective/Open Wound Instrument: Blade, Forceps Bleeding: Minimum Hemostasis Achieved: Pressure End Time: 11:43 Procedural Pain: 1 Post Procedural Pain: 0 Response to Treatment: Procedure was tolerated well Level of Consciousness Awake and Alert (Post-procedure): Post Debridement Measurements of Total Wound Length: (cm) 1 Width: (cm) 2.6 Depth: (cm) 0.6 Volume: (cm) 1.225 Character of Wound/Ulcer Post Improved Debridement: Severity of Tissue Post Debridement: Fat layer exposed Post Procedure Diagnosis Same as Pre-procedure Electronic Signature(s) Signed: 07/30/2019 5:29:05 PM By: Tonya Ham MD Signed: 07/31/2019 8:57:53 AM By: Tonya Coria RN Entered By: Tonya Hunter on 07/30/2019 12:23:53 -------------------------------------------------------------------------------- HPI Details Patient Name: Date of Service: Tonya Hunter 07/30/2019 10:45  AM Medical Record [id number] Patient Account Number: [id number] Date of Birth/Sex: Treating RN: Jun 04, 1934 (85 y.o. Tonya Hunter Primary Care Provider: Geoffery Hunter Other Clinician: Referring Provider: Treating Provider/Extender:Tonya Hunter, Tonya Hunter, Tonya Hunter in Treatment: 9 History of Present Illness HPI Description: 05/22/2019 patient is seen today in regard to her bilateral lower extremities this was a referral from her primary care provider they were concerned about areas of bruising and contusion that happened on a regular basis. With that being said the patient really has an issue with her right great toe where she has a significant blister that wraps around to the underside of the toe that has me more concerned to be perfectly honest than the legs themselves do they seem to be doing quite well. I explained in regard to the legs she is good to have ongoing and continued issues with blisters based on the fragility of her skin as long as she is on the Plavix and aspirin this is likely to be an ongoing issue. Honestly what she is going need to do is protect that area. In regard to the toe however that started 2 days ago and she tells me it seems to be getting worse each day. No fevers, chills, nausea, vomiting, or diarrhea. 05/29/2019 on evaluation today patient appears to be doing much better with regard to her leg which in fact appears to be completely healed. That is on the left. In regard to her right great toe this is showing signs of improvement I am very pleased with how things seem to be progressing after we had removed the blistered skin down to an open wound last week. Overall I feel like there is new tissue growth and I am very excited that this in 1 week is doing so well. 06/05/2019 on evaluation today patient appears to be doing actually fairly well in regard to her toe ulcer. Unfortunately she does have a skin tear on the anterior surface of her right  lower extremity which unfortunately is a little bit  rolled under on the edge of the skin tear. Nonetheless I think we may be able to straighten this out and if so that would go a long way to help in this area to heal more effectively and quickly. Fortunately there is no signs of active infection at this time. She does have some swelling but again I think this is secondary to the skin tear as opposed to anything else. 06/12/2019 on evaluation today patient appears to be doing about the same in regard to the toe. With that being said she is having issues with the Steri-Strips having come off on her leg and there was some skin that actually necrosis and did slough off at this point. Fortunately there is no evidence of active infection at this time. She is very concerned about the possibility of amputation she states her daughter is also very concerned. With that being said I have not seen her daughter up to this point. Also have not really realized until today that the patient may have a component of dementia that may be preventing complete compliance and remembering of what needs to be done as far as taking care of her wound. 06/19/2019 on evaluation today patient appears to be doing well with regard to her lower extremity ulcer the toe ulcer is measuring better size wise but does have a little bit more depth. Again I do think we may want proceed with an x- ray at this point in order to further evaluate this and see if there is anything we need to do to aid in getting this to heal more appropriately and quickly. Fortunately there is no signs of systemic infection at this time. 06/26/2019 upon evaluation today patient actually appears to be doing quite well with regard to her lower extremity ulcer and her toe ulcer. I did prescribe her doxycycline in the interim since I last saw her last week and this week. She contacted the office on Friday and wanted to see about getting something at that point based on  the results of the x-ray finding. There did not appear to be any signs of osteomyelitis but there was some evidence potentially of infection. Nonetheless I did send this in for her. 2/15; patient was worked in the clinic today after calling with severe pain in the right great toe. She had an x-ray done last week that was negative. She was put on doxycycline because of possible cellulitis. There is significant erythema in the dorsal aspect of the right great toe. The remaining wound is on the plantar aspect of the interphalangeal joint of the right great toe. Other wounds that were on the lower extremities seem healed. We have been using Iodoflex to the wound on the right great toe The patient has a penicillin allergy dating back into her early 43s at that point in time she developed swelling in the foot and ataxia of gait. She did not have a rash, anaphylactoid issues as far as I can tell and as far she remembers 07/03/19 upon evaluation today patient appears to be doing still poorly in regard to her great toe ulcer. Unfortunately she tells me that she is still having discomfort she did have to see Dr. [name] due to things not doing so well on the 15th which was just a couple of days ago. He did switcher at that time as far as the antibiotics were concerned to a different oral antibiotic. This was Keflex. With that being said she maybe does appear to be doing  somewhat better though she does have a small abscess area in the toe I am still really feeling like there may be something greater going on here I think the patient may need to have them ride to further evaluate the situation. 2/23; patient seems to alternate clinics on a weekly basis. I note that she had an MRI showing a soft tissue ulceration of the medial plantar base of the great toe with underlying early osteomyelitis of the first distal phalanx. I gave her Keflex a week or so ago for what looked to be cellulitis of the right great toe  resistant to doxycycline and this looks a lot better. We have been using silver alginate. 3/2; the patient had lab work done unfortunately she spent a long time in the ER for it which was really not necessary. Her creatinine was 1.03 white count 3.5 hemoglobin 11.9 differential count essentially normal. Sedimentation rate at 32 BMP otherwise normal. C-reactive protein was not elevated at 0.6. The patient started on Levaquin last week she had already had a week of Keflex and perhaps doxycycline before this. My goal is that give her a least 3 or 4 weeks of Levaquin. She is tolerating this well without complaints 3/9; the patient is tolerating the Levaquin well. She will probably need at least another 2 or 3 weeks of this. Wound on the right medial toe looks better. Using silver alginate but I changed to silver collagen today She also scratched herself while putting on her stockings with a nail. She has a new wound on the right medial calf 3/16; the patient is tolerating the Levaquin she will need about another week this will give her 6 weeks of antibiotics. The wound on the right medial toe had eschar around it debris. More concerning than this she had a new satellite lesion more dorsally around the medial aspect. She thinks this came up about 3 days ago. She still has the scratch injury on the left leg from last time Electronic Signature(s) Signed: 07/30/2019 5:29:05 PM By: Tonya Ham MD Entered By: Tonya Hunter on 07/30/2019 12:24:55 -------------------------------------------------------------------------------- Physical Exam Details Patient Name: Date of Service: Tonya Hunter 07/30/2019 10:45 AM Medical Record [id number] Patient Account Number: [id number] Date of Birth/Sex: Treating RN: 12-Dec-1934 (85 y.o. Tonya Hunter Primary Care Provider: Geoffery Hunter Other Clinician: Referring Provider: Treating Provider/Extender:Nicholas Trompeter, Tonya Hunter, [name] Weeks in  Treatment: 9 Constitutional Respirations regular, non-labored and within target range.Marland Kitchen Appears in no distress. Notes Wound exam; the wound looks about the same. Debris eschar skin from the edges. I do not think the dimensions of come down that much. On the dorsal part of the toe around the medial edge is a new satellite area. Although my first thought is that these wounds likely connect from a draining area anteriorly or from her osteomyelitis I could not prove this. Electronic Signature(s) Signed: 07/30/2019 5:29:05 PM By: Tonya Ham MD Entered By: Tonya Hunter on 07/30/2019 12:25:53 -------------------------------------------------------------------------------- Physician Orders Details Patient Name: Date of Service: Tonya Hunter 07/30/2019 10:45 AM Medical Record [id number] Patient Account Number: [id number] Date of Birth/Sex: Treating RN: 1934-07-04 (84 y.o. 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 Dressing Change Frequency Wound #2 Right,Medial Toe Great Change Dressing every other day. Wound #4 Right,Medial Lower Leg Change Dressing every other day. Wound #5 Right,Medial Toe Great Change Dressing every other  day. Skin Barriers/Peri-Wound Care Moisturizing lotion - to right leg Wound Cleansing Wound #2 Right,Medial Toe Great Clean wound with Wound Cleanser Wound #4 Right,Medial Lower Leg Clean wound with Wound Cleanser Wound #5 Right,Medial Toe Great Clean wound with Wound Cleanser Primary Wound Dressing Wound #2 Right,Medial Toe Great Calcium Alginate with Silver Wound #4 Right,Medial Lower Leg Calcium Alginate with Silver Wound #5 Right,Medial Toe Great Calcium Alginate with Silver Secondary Dressing Wound #2 Right,Medial Toe Great Kerlix/Rolled Gauze Dry Gauze Wound #4 Right,Medial Lower Leg Kerlix/Rolled Gauze Dry Gauze Wound #5 Right,Medial Toe Great Kerlix/Rolled Gauze Dry Gauze Edema Control Avoid standing for long periods of time Elevate legs to the level of the heart or above for 30 minutes daily and/or when sitting, a frequency of: - whenever sitting throughout the day Exercise regularly Support Garment 20-30 mm/Hg pressure to: - to both legs daily Off-Loading Open toe surgical shoe to: - RIGHT FOOT felt to shoe Patient Medications Allergies: penicillin, Demerol Notifications Medication Indication Start End Levaquin wound infection 07/30/2019 DOSE oral 500 mg tablet - 1 tablet oral daily for a further 7 days (continuing rx) Electronic Signature(s) Signed: 07/30/2019 12:31:32 PM By: Tonya Ham MD Entered By: Tonya Hunter on 07/30/2019 12:31:31 -------------------------------------------------------------------------------- Problem List Details Patient Name: Date of Service: Tonya Hunter 07/30/2019 10:45 AM Medical Record [id number] Patient Account Number: [id number] Date of Birth/Sex: Treating RN: 1934-11-09 (85 y.o. Tonya Hunter Primary Care Provider: Geoffery Hunter Other Clinician: Referring Provider: Treating Provider/Extender:Courtland Coppa, Tonya Hunter, Tonya Hunter in Treatment: 9 Active Problems ICD-10 Evaluated Encounter Code  Description Active Date Today Diagnosis L97.512 Non-pressure chronic ulcer of other part of right foot 05/22/2019 No Yes with fat layer exposed M86.671 Other chronic osteomyelitis, right ankle and foot 07/09/2019 No Yes I87.2 Venous insufficiency (chronic) (peripheral) 05/22/2019 No Yes S81.801A Unspecified open wound, right lower leg, initial 06/05/2019 No Yes encounter E11.622 Type 2 diabetes mellitus with other skin ulcer 05/22/2019 No Yes Inactive Problems ICD-10 Code Description Active Date Inactive Date L97.822 Non-pressure chronic ulcer of other part of left lower leg with 05/22/2019 05/22/2019 fat layer exposed I10 Essential (primary) hypertension 05/22/2019 05/22/2019 L03.115 Cellulitis of right lower limb 07/01/2019 07/01/2019 I25.10 Atherosclerotic heart disease of native coronary artery without 05/22/2019 05/22/2019 angina pectoris I50.42 Chronic combined systolic (congestive) and diastolic 11/19/7339 01/16/7901 (congestive) heart failure Resolved Problems Electronic Signature(s) Signed: 07/30/2019 5:29:05 PM By: Tonya Ham MD Entered By: Tonya Hunter on 07/30/2019 12:23:23 -------------------------------------------------------------------------------- Progress Note Details Patient Name: Date of Service: Tonya Hunter 07/30/2019 10:45 AM Medical Record [id number] Patient Account Number: [id number] Date of Birth/Sex: Treating RN: Jun 26, 1934 (85 y.o. Tonya Hunter Primary Care Provider: Geoffery Hunter Other Clinician: Referring Provider: Treating Provider/Extender:Cornelius Schuitema, Tonya Hunter, Tonya Hunter in Treatment: 9 Subjective History of Present Illness (HPI) 05/22/2019 patient is seen today in regard to her bilateral lower extremities this was a referral from her primary care provider they were concerned about areas of bruising and contusion that happened on a regular basis. With that being said the patient really has an issue  with her right great toe where she has a  significant blister that wraps around to the underside of the toe that has me more concerned to be perfectly honest than the legs themselves do they seem to be doing quite well. I explained in regard to the legs she is good to have ongoing and continued issues with blisters based on the fragility of her skin as long as she is on the Plavix and aspirin this is likely to be an ongoing issue. Honestly what she is going need to do is protect that area. In regard to the toe however that started 2 days ago and she tells me it seems to be getting worse each day. No fevers, chills, nausea, vomiting, or diarrhea. 05/29/2019 on evaluation today patient appears to be doing much better with regard to her leg which in fact appears to be completely healed. That is on the left. In regard to her right great toe this is showing signs of improvement I am very pleased with how things seem to be progressing after we had removed the blistered skin down to an open wound last week. Overall I feel like there is new tissue growth and I am very excited that this in 1 week is doing so well. 06/05/2019 on evaluation today patient appears to be doing actually fairly well in regard to her toe ulcer. Unfortunately she does have a skin tear on the anterior surface of her right lower extremity which unfortunately is a little bit rolled under on the edge of the skin tear. Nonetheless I think we may be able to straighten this out and if so that would go a long way to help in this area to heal more effectively and quickly. Fortunately there is no signs of active infection at this time. She does have some swelling but again I think this is secondary to the skin tear as opposed to anything else. 06/12/2019 on evaluation today patient appears to be doing about the same in regard to the toe. With that being said she is having issues with the Steri-Strips having come off on her leg and there was some skin that actually necrosis and did  slough off at this point. Fortunately there is no evidence of active infection at this time. She is very concerned about the possibility of amputation she states her daughter is also very concerned. With that being said I have not seen her daughter up to this point. Also have not really realized until today that the patient may have a component of dementia that may be preventing complete compliance and remembering of what needs to be done as far as taking care of her wound. 06/19/2019 on evaluation today patient appears to be doing well with regard to her lower extremity ulcer the toe ulcer is measuring better size wise but does have a little bit more depth. Again I do think we may want proceed with an x- ray at this point in order to further evaluate this and see if there is anything we need to do to aid in getting this to heal more appropriately and quickly. Fortunately there is no signs of systemic infection at this time. 06/26/2019 upon evaluation today patient actually appears to be doing quite well with regard to her lower extremity ulcer and her toe ulcer. I did prescribe her doxycycline in the interim since I last saw her last week and this week. She contacted the office on Friday and wanted to see about getting something at  that point based on the results of the x-ray finding. There did not appear to be any signs of osteomyelitis but there was some evidence potentially of infection. Nonetheless I did send this in for her. 2/15; patient was worked in the clinic today after calling with severe pain in the right great toe. She had an x-ray done last week that was negative. She was put on doxycycline because of possible cellulitis. There is significant erythema in the dorsal aspect of the right great toe. The remaining wound is on the plantar aspect of the interphalangeal joint of the right great toe. Other wounds that were on the lower extremities seem healed. We have been using Iodoflex to the  wound on the right great toe The patient has a penicillin allergy dating back into her early 21s at that point in time she developed swelling in the foot and ataxia of gait. She did not have a rash, anaphylactoid issues as far as I can tell and as far she remembers 07/03/19 upon evaluation today patient appears to be doing still poorly in regard to her great toe ulcer. Unfortunately she tells me that she is still having discomfort she did have to see Dr. [name] due to things not doing so well on the 15th which was just a couple of days ago. He did switcher at that time as far as the antibiotics were concerned to a different oral antibiotic. This was Keflex. With that being said she maybe does appear to be doing somewhat better though she does have a small abscess area in the toe I am still really feeling like there may be something greater going on here I think the patient may need to have them ride to further evaluate the situation. 2/23; patient seems to alternate clinics on a weekly basis. I note that she had an MRI showing a soft tissue ulceration of the medial plantar base of the great toe with underlying early osteomyelitis of the first distal phalanx. I gave her Keflex a week or so ago for what looked to be cellulitis of the right great toe resistant to doxycycline and this looks a lot better. We have been using silver alginate. 3/2; the patient had lab work done unfortunately she spent a long time in the ER for it which was really not necessary. Her creatinine was 1.03 white count 3.5 hemoglobin 11.9 differential count essentially normal. Sedimentation rate at 32 BMP otherwise normal. C-reactive protein was not elevated at 0.6. The patient started on Levaquin last week she had already had a week of Keflex and perhaps doxycycline before this. My goal is that give her a least 3 or 4 weeks of Levaquin. She is tolerating this well without complaints 3/9; the patient is tolerating the Levaquin  well. She will probably need at least another 2 or 3 weeks of this. Wound on the right medial toe looks better. Using silver alginate but I changed to silver collagen today She also scratched herself while putting on her stockings with a nail. She has a new wound on the right medial calf 3/16; the patient is tolerating the Levaquin she will need about another week this will give her 6 weeks of antibiotics. The wound on the right medial toe had eschar around it debris. More concerning than this she had a new satellite lesion more dorsally around the medial aspect. She thinks this came up about 3 days ago. She still has the scratch injury on the left leg from last time Objective  Constitutional Respirations regular, non-labored and within target range.Marland Kitchen Appears in no distress. Vitals Time Taken: 11:05 AM, Height: 66 in, Weight: 164 lbs, BMI: 26.5, Respiratory Rate: 18 breaths/min. General Notes: Wound exam; the wound looks about the same. Debris eschar skin from the edges. I do not think the dimensions of come down that much. On the dorsal part of the toe around the medial edge is a new satellite area. Although my first thought is that these wounds likely connect from a draining area anteriorly or from her osteomyelitis I could not prove this. Integumentary (Hair, Skin) Wound #2 status is Open. Original cause of wound was Blister. The wound is located on the [employer]. The wound measures 1cm length x 2.6cm width x 0.6cm depth; 2.042cm^2 area and 1.225cm^3 volume. There is Fat Layer (Subcutaneous Tissue) Exposed exposed. There is no tunneling or undermining noted. There is a small amount of serosanguineous drainage noted. The wound margin is epibole. There is large (67-100%) pink granulation within the wound bed. There is a small (1-33%) amount of necrotic tissue within the wound bed including Adherent Slough. Wound #4 status is Open. Original cause of wound was Trauma. The wound is  located on the Right,Medial Lower Leg. The wound measures 1.2cm length x 1cm width x 0.1cm depth; 0.942cm^2 area and 0.094cm^3 volume. There is Fat Layer (Subcutaneous Tissue) Exposed exposed. There is no tunneling or undermining noted. There is a medium amount of serosanguineous drainage noted. The wound margin is flat and intact. There is large (67-100%) red granulation within the wound bed. There is no necrotic tissue within the wound bed. Wound #5 status is Open. Original cause of wound was Gradually Appeared. The wound is located on the [employer]. The wound measures 0.6cm length x 0.5cm width x 0.1cm depth; 0.236cm^2 area and 0.024cm^3 volume. There is Fat Layer (Subcutaneous Tissue) Exposed exposed. There is no tunneling or undermining noted. There is a medium amount of serosanguineous drainage noted. The wound margin is flat and intact. There is medium (34-66%) red, pink granulation within the wound bed. There is a medium (34-66%) amount of necrotic tissue within the wound bed including Adherent Slough. 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 There was a Selective/Open Wound Skin/Epidermis Debridement with a total area of 2.6 sq cm performed by Tonya Hunter., MD. With the following instrument(s): Blade, and Forceps to remove Viable and Non-Viable tissue/material. Material removed includes Callus, Skin: Dermis, and Skin: Epidermis after achieving pain control using Lidocaine 5% topical ointment. No  specimens were taken. A time out was conducted at 11:41, prior to the start of the procedure. A Minimum amount of bleeding was controlled with Pressure. The procedure was tolerated well with a pain level of 1 throughout and a pain level of 0 following the procedure. Post Debridement Measurements: 1cm length x 2.6cm width x 0.6cm depth; 1.225cm^3 volume. Character of Wound/Ulcer Post Debridement is improved. Severity of Tissue Post Debridement is: Fat layer exposed. Post procedure Diagnosis Wound #2: Same as Pre-Procedure Plan Follow-up Appointments: Return Appointment in 1 week. Dressing Change Frequency: Wound #2 Right,Medial Toe  Great: Change Dressing every other day. Wound #4 Right,Medial Lower Leg: Change Dressing every other day. Wound #5 Right,Medial Toe Great: Change Dressing every other day. Skin Barriers/Peri-Wound Care: Moisturizing lotion - to right leg Wound Cleansing: Wound #2 Right,Medial Toe Great: Clean wound with Wound Cleanser Wound #4 Right,Medial Lower Leg: Clean wound with Wound Cleanser Wound #5 Right,Medial Toe Great: Clean wound with Wound Cleanser Primary Wound Dressing: Wound #2 Right,Medial Toe Great: Calcium Alginate with Silver Wound #4 Right,Medial Lower Leg: Calcium Alginate with Silver Wound #5 Right,Medial Toe Great: Calcium Alginate with Silver Secondary Dressing: Wound #2 Right,Medial Toe Great: Kerlix/Rolled Gauze Dry Gauze Wound #4 Right,Medial Lower Leg: Kerlix/Rolled Gauze Dry Gauze Wound #5 Right,Medial Toe Great: Kerlix/Rolled Gauze Dry Gauze Edema Control: Avoid standing for long periods of time Elevate legs to the level of the heart or above for 30 minutes daily and/or when sitting, a frequency of: - whenever sitting throughout the day Exercise regularly Support Garment 20-30 mm/Hg pressure to: - to both legs daily Off-Loading: Open toe surgical shoe to: - RIGHT FOOT felt to shoe The following medication(s) was prescribed:  Levaquin oral 500 mg tablet 1 tablet oral daily for a further 7 days (continuing rx) for wound infection starting 07/30/2019 1. Change back to silver alginate this week after changing to silver collagen last week I was trying to get the area on the toe to fill-in however we have another wound 2. I am extending the Levaquin for the last week she should have about another 10 or 11 days. We are treating the early osteomyelitis identified by her MRI. She seems to be tolerating this without side effects 3. I have asked her to keep off this toe. She then told me that she walked around [employer]. I asked her to think about this every time she is going to get up on her foot. 4. Silver alginate to the scratch injury on the mid tibia in the setting of severe chronic venous insufficiency Electronic Signature(s) Signed: 07/30/2019 5:29:05 PM By: Tonya Ham MD Entered By: Tonya Hunter on 07/30/2019 12:32:04 -------------------------------------------------------------------------------- SuperBill Details Patient Name: Date of Service: Tonya Hunter 07/30/2019 Medical Record [id number] Patient Account Number: [id number] Date of Birth/Sex: Treating RN: [birth year] (85 y.o. Tonya Hunter Primary Care Provider: Geoffery Hunter Other Clinician: Referring Provider: Treating Provider/Extender:Arlana Canizales, Tonya Hunter, Tonya Hunter in Treatment: 9 Diagnosis Coding ICD-10 Codes Code Description [phone] Non-pressure chronic ulcer of other part of right foot with fat layer exposed M86.671 Other chronic osteomyelitis, right ankle and foot I87.2 Venous insufficiency (chronic) (peripheral) S81.801A Unspecified open wound, right lower leg, initial encounter E11.622 Type 2 diabetes mellitus with other skin ulcer Facility Procedures CPT4 Code Description: [id number] - DEBRIDE WOUND 1ST 20 SQ CM OR < ICD-10 Diagnosis Description L97.512 Non-pressure chronic ulcer of other part of right foot  with f Modifier: at layer ex Quantity: 1 posed Physician Procedures CPT4 Code Description: [id number] - WC PHYS DEBR WO ANESTH 20 SQ CM ICD-10 Diagnosis Description L97.512 Non-pressure chronic ulcer of other part of right foot with f Modifier: at layer ex Quantity: 1 posed Electronic Signature(s) Signed: 07/30/2019 5:29:05 PM By: Tonya Ham MD Entered By: Tonya Hunter on 07/30/2019 12:32:18

## 2019-08-02 ENCOUNTER — Encounter: Payer: Self-pay | Admitting: Physical Therapy

## 2019-08-02 ENCOUNTER — Ambulatory Visit: Payer: Medicare Other | Admitting: Physical Therapy

## 2019-08-02 ENCOUNTER — Other Ambulatory Visit: Payer: Self-pay

## 2019-08-02 DIAGNOSIS — M6281 Muscle weakness (generalized): Secondary | ICD-10-CM | POA: Diagnosis not present

## 2019-08-02 DIAGNOSIS — R2681 Unsteadiness on feet: Secondary | ICD-10-CM | POA: Diagnosis not present

## 2019-08-02 DIAGNOSIS — R262 Difficulty in walking, not elsewhere classified: Secondary | ICD-10-CM | POA: Diagnosis not present

## 2019-08-02 DIAGNOSIS — R2689 Other abnormalities of gait and mobility: Secondary | ICD-10-CM
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
## 2019-08-06 ENCOUNTER — Other Ambulatory Visit: Payer: Self-pay

## 2019-08-06 ENCOUNTER — Encounter (HOSPITAL_BASED_OUTPATIENT_CLINIC_OR_DEPARTMENT_OTHER): Payer: Medicare Other | Admitting: Internal Medicine

## 2019-08-06 DIAGNOSIS — Z7902 Long term (current) use of antithrombotics/antiplatelets: Secondary | ICD-10-CM | POA: Diagnosis not present

## 2019-08-06 DIAGNOSIS — I872 Venous insufficiency (chronic) (peripheral): Secondary | ICD-10-CM | POA: Diagnosis not present

## 2019-08-06 DIAGNOSIS — M86671 Other chronic osteomyelitis, right ankle and foot: Secondary | ICD-10-CM | POA: Diagnosis not present

## 2019-08-06 DIAGNOSIS — E11622 Type 2 diabetes mellitus with other skin ulcer: Secondary | ICD-10-CM | POA: Diagnosis not present

## 2019-08-06 DIAGNOSIS — E11621 Type 2 diabetes mellitus with foot ulcer: Secondary | ICD-10-CM | POA: Diagnosis not present

## 2019-08-06 DIAGNOSIS — L97512 Non-pressure chronic ulcer of other part of right foot with fat layer exposed: Secondary | ICD-10-CM | POA: Diagnosis not present

## 2019-08-06 DIAGNOSIS — Z7982 Long term (current) use of aspirin: Secondary | ICD-10-CM | POA: Diagnosis not present

## 2019-08-06 NOTE — Progress Notes (Signed)
Tonya, Hunter ([id number]) Visit Report for 08/06/2019 Debridement Details Patient Name: Date of Service: [name], [name] 08/06/2019 10:45 AM Medical Record [id number] Patient Account Number: [id number] Date of Birth/Sex: Treating RN: Oct 12, 1934 (85 y.o. Tonya Hunter Primary Care Provider: Geoffery Hunter Other Clinician: Referring Provider: Treating Provider/Extender:Tonya Hunter, [name], [name] in Treatment: 10 Debridement Performed for Wound #2 Right,Medial Toe Great Assessment: Performed By: Physician Tonya Hunter., MD Debridement Type: Debridement Severity of Tissue Pre Fat layer exposed Debridement: Level of Consciousness (Pre- Awake and Alert procedure): Pre-procedure Verification/Time Out Taken: Yes - 11:33 Start Time: 11:33 Pain Control: Lidocaine 5% topical ointment Total Area Debrided (L x W): 1 (cm) x 1.1 (cm) = 1.1 (cm) Tissue and other material Viable, Non-Viable, Slough, Subcutaneous, Skin: Dermis , Skin: Epidermis, Slough debrided: Level: Skin/Subcutaneous Tissue Debridement Description: Excisional Instrument: Curette Bleeding: Moderate Hemostasis Achieved: Silver Nitrate End Time: 11:36 Procedural Pain: 0 Post Procedural Pain: 0 Response to Treatment: Procedure was tolerated well Level of Consciousness Awake and Alert (Post-procedure): Post Debridement Measurements of Total Wound Length: (cm) 1 Width: (cm) 1.1 Depth: (cm) 0.4 Volume: (cm) 0.346 Character of Wound/Ulcer Post Improved Debridement: Severity of Tissue Post Debridement: Fat layer exposed Post Procedure Diagnosis Same as Pre-procedure Electronic Signature(s) Signed: 08/06/2019 5:53:09 PM By: Tonya Ham MD Signed: 08/06/2019 5:59:23 PM By: Tonya Coria RN Entered By: Tonya Hunter on 08/06/2019 12:41:45 -------------------------------------------------------------------------------- HPI Details Patient Name: Date of Service: Tonya Hunter  08/06/2019 10:45 AM Medical Record [id number] Patient Account Number: [id number] Date of Birth/Sex: Treating RN: 1934-09-06 (85 y.o. Tonya Hunter Primary Care Provider: Geoffery Hunter Other Clinician: Referring Provider: Treating Provider/Extender:Tonya Hunter, [name], [name] in Treatment: 10 History of Present Illness HPI Description: 05/22/2019 patient is seen today in regard to her bilateral lower extremities this was a referral from her primary care provider they were concerned about areas of bruising and contusion that happened on a regular basis. With that being said the patient really has an issue with her right great toe where she has a significant blister that wraps around to the underside of the toe that has me more concerned to be perfectly honest than the legs themselves do they seem to be doing quite well. I explained in regard to the legs she is good to have ongoing and continued issues with blisters based on the fragility of her skin as long as she is on the Plavix and aspirin this is likely to be an ongoing issue. Honestly what she is going need to do is protect that area. In regard to the toe however that started 2 days ago and she tells me it seems to be getting worse each day. No fevers, chills, nausea, vomiting, or diarrhea. 05/29/2019 on evaluation today patient appears to be doing much better with regard to her leg which in fact appears to be completely healed. That is on the left. In regard to her right great toe this is showing signs of improvement I am very pleased with how things seem to be progressing after we had removed the blistered skin down to an open wound last week. Overall I feel like there is new tissue growth and I am very excited that this in 1 week is doing so well. 06/05/2019 on evaluation today patient appears to be doing actually fairly well in regard to her toe ulcer. Unfortunately she does have a skin tear on the anterior surface  of her right lower extremity which unfortunately is a  little bit rolled under on the edge of the skin tear. Nonetheless I think we may be able to straighten this out and if so that would go a long way to help in this area to heal more effectively and quickly. Fortunately there is no signs of active infection at this time. She does have some swelling but again I think this is secondary to the skin tear as opposed to anything else. 06/12/2019 on evaluation today patient appears to be doing about the same in regard to the toe. With that being said she is having issues with the Steri-Strips having come off on her leg and there was some skin that actually necrosis and did slough off at this point. Fortunately there is no evidence of active infection at this time. She is very concerned about the possibility of amputation she states her daughter is also very concerned. With that being said I have not seen her daughter up to this point. Also have not really realized until today that the patient may have a component of dementia that may be preventing complete compliance and remembering of what needs to be done as far as taking care of her wound. 06/19/2019 on evaluation today patient appears to be doing well with regard to her lower extremity ulcer the toe ulcer is measuring better size wise but does have a little bit more depth. Again I do think we may want proceed with an x- ray at this point in order to further evaluate this and see if there is anything we need to do to aid in getting this to heal more appropriately and quickly. Fortunately there is no signs of systemic infection at this time. 06/26/2019 upon evaluation today patient actually appears to be doing quite well with regard to her lower extremity ulcer and her toe ulcer. I did prescribe her doxycycline in the interim since I last saw her last week and this week. She contacted the office on Friday and wanted to see about getting something at that  point based on the results of the x-ray finding. There did not appear to be any signs of osteomyelitis but there was some evidence potentially of infection. Nonetheless I did send this in for her. 2/15; patient was worked in the clinic today after calling with severe pain in the right great toe. She had an x-ray done last week that was negative. She was put on doxycycline because of possible cellulitis. There is significant erythema in the dorsal aspect of the right great toe. The remaining wound is on the plantar aspect of the interphalangeal joint of the right great toe. Other wounds that were on the lower extremities seem healed. We have been using Iodoflex to the wound on the right great toe The patient has a penicillin allergy dating back into her early 30s at that point in time she developed swelling in the foot and ataxia of gait. She did not have a rash, anaphylactoid issues as far as I can tell and as far she remembers 07/03/19 upon evaluation today patient appears to be doing still poorly in regard to her great toe ulcer. Unfortunately she tells me that she is still having discomfort she did have to see Dr. [name] due to things not doing so well on the 15th which was just a couple of days ago. He did switcher at that time as far as the antibiotics were concerned to a different oral antibiotic. This was Keflex. With that being said she maybe does appear to  be doing somewhat better though she does have a small abscess area in the toe I am still really feeling like there may be something greater going on here I think the patient may need to have them ride to further evaluate the situation. 2/23; patient seems to alternate clinics on a weekly basis. I note that she had an MRI showing a soft tissue ulceration of the medial plantar base of the great toe with underlying early osteomyelitis of the first distal phalanx. I gave her Keflex a week or so ago for what looked to be cellulitis of the  right great toe resistant to doxycycline and this looks a lot better. We have been using silver alginate. 3/2; the patient had lab work done unfortunately she spent a long time in the ER for it which was really not necessary. Her creatinine was 1.03 white count 3.5 hemoglobin 11.9 differential count essentially normal. Sedimentation rate at 32 BMP otherwise normal. C-reactive protein was not elevated at 0.6. The patient started on Levaquin last week she had already had a week of Keflex and perhaps doxycycline before this. My goal is that give her a least 3 or 4 weeks of Levaquin. She is tolerating this well without complaints 3/9; the patient is tolerating the Levaquin well. She will probably need at least another 2 or 3 weeks of this. Wound on the right medial toe looks better. Using silver alginate but I changed to silver collagen today She also scratched herself while putting on her stockings with a nail. She has a new wound on the right medial calf 3/16; the patient is tolerating the Levaquin she will need about another week this will give her 6 weeks of antibiotics. The wound on the right medial toe had eschar around it debris. More concerning than this she had a new satellite lesion more dorsally around the medial aspect. She thinks this came up about 3 days ago. She still has the scratch injury on the left leg from last time 3/23; the patient should have about 3 or 4 more days of Levaquin and not we will give her 6 weeks of treatment. The wound on the right medial toe has thick callus and eschar indicative of unrelieved offloading. She is unsteady on her feet uses a walker unfortunately there is not much alternative here. She had a skin lesion on her right lower lip leg last week which was minor trauma in the setting of severe chronic venous insufficiency. These healed however she has a new 1 more anteriorly. She says this is when her husband was struggling to get her compression stocking  on. Electronic Signature(s) Signed: 08/06/2019 5:53:09 PM By: Tonya Ham MD Entered By: Tonya Hunter on 08/06/2019 12:43:09 -------------------------------------------------------------------------------- Physical Exam Details Patient Name: Date of Service: Tonya Hunter 08/06/2019 10:45 AM Medical Record [id number] Patient Account Number: [id number] Date of Birth/Sex: Treating RN: [birth year] (85 y.o. Tonya Hunter Primary Care Provider: Geoffery Hunter Other Clinician: Referring Provider: Treating Provider/Extender:Mansur Patti, [name], [name] in Treatment: 10 Constitutional Sitting or standing Blood Pressure is within target range for patient.. Pulse regular and within target range for patient.Marland Kitchen Respirations regular, non-labored and within target range.. Temperature is normal and within the target range for the patient.Marland Kitchen Appears in no distress. Notes Wound exam; the wound requires an extensive debridement of callus skin and subcutaneous tissue from the wound edges and debris on the wound surface. She has a superficial area on the right anterior lower tibia Electronic Signature(s) Signed:  08/06/2019 5:53:09 PM By: Tonya Ham MD Entered By: Tonya Hunter on 08/06/2019 12:45:18 -------------------------------------------------------------------------------- Physician Orders Details Patient Name: Date of Service: Tonya Hunter 08/06/2019 10:45 AM Medical Record [id number] Patient Account Number: [id number] Date of Birth/Sex: Treating RN: [birth year] (84 y.o. Tonya Hunter Primary Care Provider: Geoffery Hunter Other Clinician: Referring Provider: Treating Provider/Extender:Shaletha Humble, [name], [name] in Treatment: 10 Verbal / Phone Orders: No Diagnosis Coding ICD-10 Coding Code Description L97.512 Non-pressure chronic ulcer of other part of right foot with fat layer exposed M86.671 Other chronic osteomyelitis,  right ankle and foot I87.2 Venous insufficiency (chronic) (peripheral) S81.801A Unspecified open wound, right lower leg, initial encounter E11.622 Type 2 diabetes mellitus with other skin ulcer Follow-up Appointments Return Appointment in 1 week. Dressing Change Frequency Wound #2 Right,Medial Toe Great Change Dressing every other day. Skin Barriers/Peri-Wound Care Moisturizing lotion - to right leg Wound Cleansing Wound #2 Right,Medial Toe Great Clean wound with Wound Cleanser Primary Wound Dressing Wound #2 Right,Medial Toe Great Silver Collagen Secondary Dressing Wound #2 Right,Medial Toe Great Kerlix/Rolled Gauze Dry Gauze Edema Control Avoid standing for long periods of time Elevate legs to the level of the heart or above for 30 minutes daily and/or when sitting, a frequency of: - whenever sitting throughout the day Exercise regularly Support Garment 20-30 mm/Hg pressure to: - to both legs daily Off-Loading Open toe surgical shoe to: - RIGHT FOOT felt to shoe Electronic Signature(s) Signed: 08/06/2019 5:53:09 PM By: Tonya Ham MD Signed: 08/06/2019 5:59:23 PM By: Tonya Coria RN Entered By: Tonya Hunter on 08/06/2019 11:42:00 -------------------------------------------------------------------------------- Problem List Details Patient Name: Date of Service: Tonya Hunter 08/06/2019 10:45 AM Medical Record [id number] Patient Account Number: [id number] Date of Birth/Sex: Treating RN: Oct 07, 1934 (85 y.o. Tonya Hunter Primary Care Provider: Geoffery Hunter Other Clinician: Referring Provider: Treating Provider/Extender:Mahayla Haddaway, [name], [name] in Treatment: 10 Active Problems ICD-10 Evaluated Encounter Code Description Active Date Today Diagnosis L97.512 Non-pressure chronic ulcer of other part of right foot 05/22/2019 No Yes with fat layer exposed M86.671 Other chronic osteomyelitis, right ankle and foot 07/09/2019 No Yes I87.2 Venous  insufficiency (chronic) (peripheral) 05/22/2019 No Yes S81.801A Unspecified open wound, right lower leg, initial 06/05/2019 No Yes encounter E11.622 Type 2 diabetes mellitus with other skin ulcer 05/22/2019 No Yes Inactive Problems ICD-10 Code Description Active Date Inactive Date L97.822 Non-pressure chronic ulcer of other part of left lower leg with 05/22/2019 05/22/2019 fat layer exposed I10 Essential (primary) hypertension 05/22/2019 05/22/2019 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 01/20/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) heart failure L03.115 Cellulitis of right lower limb 07/01/2019 07/01/2019 I25.10 Atherosclerotic heart disease of native coronary artery without 05/22/2019 05/22/2019 angina pectoris Resolved Problems Electronic Signature(s) Signed: 08/06/2019 5:53:09 PM By: Tonya Ham MD Entered By: Tonya Hunter on 08/06/2019 12:39:42 -------------------------------------------------------------------------------- Progress Note Details Patient Name: Date of Service: Tonya Hunter 08/06/2019 10:45 AM Medical Record [id number] Patient Account Number: [id number] Date of Birth/Sex: Treating RN: 14-Aug-1934 (85 y.o. Tonya Hunter Primary Care Provider: Geoffery Hunter Other Clinician: Referring Provider: Treating Provider/Extender:Ido Wollman, [name], [name] in Treatment: 10 Subjective History of Present Illness (HPI) 05/22/2019 patient is seen today in regard to her bilateral lower extremities this was a referral from her primary care provider they were concerned about areas of bruising and contusion that happened on a regular basis. With that being said the patient really has an issue with her right great toe where she has a significant blister that wraps around to  the underside of the toe that has me more concerned to be perfectly honest than the legs themselves do they seem to be doing quite well. I explained in regard to the legs she is good to have  ongoing and continued issues with blisters based on the fragility of her skin as long as she is on the Plavix and aspirin this is likely to be an ongoing issue. Honestly what she is going need to do is protect that area. In regard to the toe however that started 2 days ago and she tells me it seems to be getting worse each day. No fevers, chills, nausea, vomiting, or diarrhea. 05/29/2019 on evaluation today patient appears to be doing much better with regard to her leg which in fact appears to be completely healed. That is on the left. In regard to her right great toe this is showing signs of improvement I am very pleased with how things seem to be progressing after we had removed the blistered skin down to an open wound last week. Overall I feel like there is new tissue growth and I am very excited that this in 1 week is doing so well. 06/05/2019 on evaluation today patient appears to be doing actually fairly well in regard to her toe ulcer. Unfortunately she does have a skin tear on the anterior surface of her right lower extremity which unfortunately is a little bit rolled under on the edge of the skin tear. Nonetheless I think we may be able to straighten this out and if so that would go a long way to help in this area to heal more effectively and quickly. Fortunately there is no signs of active infection at this time. She does have some swelling but again I think this is secondary to the skin tear as opposed to anything else. 06/12/2019 on evaluation today patient appears to be doing about the same in regard to the toe. With that being said she is having issues with the Steri-Strips having come off on her leg and there was some skin that actually necrosis and did slough off at this point. Fortunately there is no evidence of active infection at this time. She is very concerned about the possibility of amputation she states her daughter is also very concerned. With that being said I have not seen  her daughter up to this point. Also have not really realized until today that the patient may have a component of dementia that may be preventing complete compliance and remembering of what needs to be done as far as taking care of her wound. 06/19/2019 on evaluation today patient appears to be doing well with regard to her lower extremity ulcer the toe ulcer is measuring better size wise but does have a little bit more depth. Again I do think we may want proceed with an x- ray at this point in order to further evaluate this and see if there is anything we need to do to aid in getting this to heal more appropriately and quickly. Fortunately there is no signs of systemic infection at this time. 06/26/2019 upon evaluation today patient actually appears to be doing quite well with regard to her lower extremity ulcer and her toe ulcer. I did prescribe her doxycycline in the interim since I last saw her last week and this week. She contacted the office on Friday and wanted to see about getting something at that point based on the results of the x-ray finding. There did not appear to  be any signs of osteomyelitis but there was some evidence potentially of infection. Nonetheless I did send this in for her. 2/15; patient was worked in the clinic today after calling with severe pain in the right great toe. She had an x-ray done last week that was negative. She was put on doxycycline because of possible cellulitis. There is significant erythema in the dorsal aspect of the right great toe. The remaining wound is on the plantar aspect of the interphalangeal joint of the right great toe. Other wounds that were on the lower extremities seem healed. We have been using Iodoflex to the wound on the right great toe The patient has a penicillin allergy dating back into her early 17s at that point in time she developed swelling in the foot and ataxia of gait. She did not have a rash, anaphylactoid issues as far as I can  tell and as far she remembers 07/03/19 upon evaluation today patient appears to be doing still poorly in regard to her great toe ulcer. Unfortunately she tells me that she is still having discomfort she did have to see Dr. [name] due to things not doing so well on the 15th which was just a couple of days ago. He did switcher at that time as far as the antibiotics were concerned to a different oral antibiotic. This was Keflex. With that being said she maybe does appear to be doing somewhat better though she does have a small abscess area in the toe I am still really feeling like there may be something greater going on here I think the patient may need to have them ride to further evaluate the situation. 2/23; patient seems to alternate clinics on a weekly basis. I note that she had an MRI showing a soft tissue ulceration of the medial plantar base of the great toe with underlying early osteomyelitis of the first distal phalanx. I gave her Keflex a week or so ago for what looked to be cellulitis of the right great toe resistant to doxycycline and this looks a lot better. We have been using silver alginate. 3/2; the patient had lab work done unfortunately she spent a long time in the ER for it which was really not necessary. Her creatinine was 1.03 white count 3.5 hemoglobin 11.9 differential count essentially normal. Sedimentation rate at 32 BMP otherwise normal. C-reactive protein was not elevated at 0.6. The patient started on Levaquin last week she had already had a week of Keflex and perhaps doxycycline before this. My goal is that give her a least 3 or 4 weeks of Levaquin. She is tolerating this well without complaints 3/9; the patient is tolerating the Levaquin well. She will probably need at least another 2 or 3 weeks of this. Wound on the right medial toe looks better. Using silver alginate but I changed to silver collagen today She also scratched herself while putting on her stockings with a  nail. She has a new wound on the right medial calf 3/16; the patient is tolerating the Levaquin she will need about another week this will give her 6 weeks of antibiotics. The wound on the right medial toe had eschar around it debris. More concerning than this she had a new satellite lesion more dorsally around the medial aspect. She thinks this came up about 3 days ago. She still has the scratch injury on the left leg from last time 3/23; the patient should have about 3 or 4 more days of Levaquin and not we  will give her 6 weeks of treatment. The wound on the right medial toe has thick callus and eschar indicative of unrelieved offloading. She is unsteady on her feet uses a walker unfortunately there is not much alternative here. She had a skin lesion on her right lower lip leg last week which was minor trauma in the setting of severe chronic venous insufficiency. These healed however she has a new 1 more anteriorly. She says this is when her husband was struggling to get her compression stocking on. Objective Constitutional Sitting or standing Blood Pressure is within target range for patient.. Pulse regular and within target range for patient.Marland Kitchen Respirations regular, non-labored and within target range.. Temperature is normal and within the target range for the patient.Marland Kitchen Appears in no distress. Vitals Time Taken: 10:56 AM, Height: 66 in, Weight: 164 lbs, BMI: 26.5, Temperature: 98.8 F, Pulse: 70 bpm, Respiratory Rate: 18 breaths/min, Blood Pressure: 124/56 mmHg. General Notes: Wound exam; the wound requires an extensive debridement of callus skin and subcutaneous tissue from the wound edges and debris on the wound surface. ooShe has a superficial area on the right anterior lower tibia Integumentary (Hair, Skin) Wound #2 status is Open. Original cause of wound was Blister. The wound is located on the [employer]. The wound measures 1cm length x 1.1cm width x 0.4cm depth;  0.864cm^2 area and 0.346cm^3 volume. There is Fat Layer (Subcutaneous Tissue) Exposed exposed. There is no tunneling or undermining noted. There is a medium amount of serosanguineous drainage noted. The wound margin is epibole. There is large (67-100%) pink granulation within the wound bed. There is a small (1-33%) amount of necrotic tissue within the wound bed including Adherent Slough. Wound #4 status is Healed - Epithelialized. Original cause of wound was Trauma. The wound is located on the Right,Medial Lower Leg. The wound measures 0cm length x 0cm width x 0cm depth; 0cm^2 area and 0cm^3 volume. There is no tunneling or undermining noted. There is a none present amount of drainage noted. The wound margin is flat and intact. There is no granulation within the wound bed. There is no necrotic tissue within the wound bed. Wound #5 status is Open. Original cause of wound was Gradually Appeared. The wound is located on the [employer]. The wound measures 0.3cm length x 0.3cm width x 0.4cm depth; 0.071cm^2 area and 0.028cm^3 volume. Wound #6 status is Open. Original cause of wound was Trauma. The wound is located on the Right,Distal,Anterior Lower Leg. The wound measures 0.9cm length x 0.4cm width x 0.1cm depth; 0.283cm^2 area and 0.028cm^3 volume. There is no tunneling or undermining noted. There is a small amount of serosanguineous drainage noted. The wound margin is flat and intact. There is large (67-100%) pink granulation within the wound bed. There is no necrotic tissue within the wound bed. 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 There was a Excisional Skin/Subcutaneous Tissue Debridement with a total area of 1.1 sq cm performed by Tonya Hunter., MD. With the following instrument(s): Curette to remove Viable and Non-Viable tissue/material. Material removed includes Subcutaneous Tissue, Slough, Skin: Dermis,  and Skin: Epidermis after achieving pain control using Lidocaine 5% topical ointment. No specimens were taken. A time out was conducted at 11:33, prior to the start of the procedure. A Moderate amount of bleeding was controlled with Silver Nitrate. The procedure was tolerated well with a pain level of 0 throughout and a pain level of 0 following the procedure. Post Debridement Measurements: 1cm length x 1.1cm width x 0.4cm depth; 0.346cm^3 volume. Character of Wound/Ulcer Post Debridement is improved. Severity of Tissue Post Debridement is: Fat layer exposed. Post procedure Diagnosis Wound #2: Same as Pre-Procedure Plan Follow-up Appointments: Return Appointment in 1 week. Dressing Change Frequency: Wound #2 Right,Medial Toe Great: Change Dressing every other day. Skin Barriers/Peri-Wound Care: Moisturizing lotion - to right leg Wound Cleansing: Wound #2 Right,Medial Toe Great: Clean wound with Wound Cleanser Primary Wound Dressing: Wound #2 Right,Medial Toe Great: Silver Collagen Secondary Dressing: Wound #2 Right,Medial Toe Great: Kerlix/Rolled Gauze Dry Gauze Edema Control: Avoid standing for long periods of time Elevate legs to the level of the heart or above for 30 minutes daily and/or when sitting, a frequency of: - whenever sitting throughout the day Exercise regularly Support Garment 20-30 mm/Hg pressure to: - to both legs daily Off-Loading: Open toe surgical shoe to: - RIGHT FOOT felt to shoe 1. Right medial plantar great toe. Extensive debridement still  silver collagen 2. Silver collagen to the right anterior lower leg 3. She is using her own stocking 4. Her antibiotic should be running out soon she has tolerated these well Electronic Signature(s) Signed: 08/06/2019 5:53:09 PM By: Tonya Ham MD Entered By: Tonya Hunter on 08/06/2019 12:46:32 -------------------------------------------------------------------------------- SuperBill Details Patient Name: Date of Service: Tonya Hunter 08/06/2019 Medical Record [id number] Patient Account Number: [id number] Date of Birth/Sex: Treating RN: [birth year] (85 y.o. Tonya Hunter Primary Care Provider: Geoffery Hunter Other Clinician: Referring Provider: Treating Provider/Extender:Quetzaly Ebner, [name], [name] in Treatment: 10 Diagnosis Coding ICD-10 Codes Code Description [phone] Non-pressure chronic ulcer of other part of right foot with fat layer exposed M86.671 Other chronic osteomyelitis, right ankle and foot I87.2 Venous insufficiency (chronic) (peripheral) S81.801A Unspecified open wound, right lower leg, initial encounter E11.622 Type 2 diabetes mellitus with other skin ulcer Facility Procedures CPT4 Code Description: [id number] 11042 - DEB SUBQ TISSUE 20 SQ CM/< ICD-10 Diagnosis Description L97.512 Non-pressure chronic ulcer of other part of right foot with Modifier: fat layer e Quantity: 1 xposed Physician Procedures CPT4 Code Description: [id number] 80034 - WC PHYS SUBQ TISS 20 SQ CM ICD-10 Diagnosis Description L97.512 Non-pressure chronic ulcer of other part of right foot with Modifier: fat layer e Quantity: 1 xposed Electronic Signature(s) Signed: 08/06/2019 5:53:09 PM By: Tonya Ham MD Entered By: Tonya Hunter on 08/06/2019 12:46:57

## 2019-08-07 DIAGNOSIS — R319 Hematuria, unspecified: Secondary | ICD-10-CM | POA: Diagnosis not present

## 2019-08-07 DIAGNOSIS — Z8719 Personal history of other diseases of the digestive system: Secondary | ICD-10-CM | POA: Diagnosis not present

## 2019-08-07 DIAGNOSIS — D649 Anemia, unspecified: Secondary | ICD-10-CM | POA: Diagnosis not present

## 2019-08-07 DIAGNOSIS — K7469 Other cirrhosis of liver: Secondary | ICD-10-CM | POA: Diagnosis not present

## 2019-08-07 DIAGNOSIS — I959 Hypotension, unspecified: Secondary | ICD-10-CM | POA: Diagnosis not present

## 2019-08-07 DIAGNOSIS — R1900 Intra-abdominal and pelvic swelling, mass and lump, unspecified site: Secondary | ICD-10-CM | POA: Diagnosis not present

## 2019-08-08 ENCOUNTER — Telehealth: Payer: Self-pay | Admitting: Internal Medicine

## 2019-08-08 ENCOUNTER — Other Ambulatory Visit: Payer: Self-pay | Admitting: Physician Assistant

## 2019-08-08 ENCOUNTER — Ambulatory Visit (INDEPENDENT_AMBULATORY_CARE_PROVIDER_SITE_OTHER): Payer: Medicare Other | Admitting: *Deleted

## 2019-08-08 ENCOUNTER — Other Ambulatory Visit: Payer: Self-pay | Admitting: Obstetrics and Gynecology

## 2019-08-08 ENCOUNTER — Other Ambulatory Visit: Payer: Self-pay

## 2019-08-08 DIAGNOSIS — I639 Cerebral infarction, unspecified: Secondary | ICD-10-CM

## 2019-08-08 DIAGNOSIS — R1900 Intra-abdominal and pelvic swelling, mass and lump, unspecified site: Secondary | ICD-10-CM

## 2019-08-08 LAB — CUP PACEART INCLINIC DEVICE CHECK
Date Time Interrogation Session: 20210325153400
Implantable Pulse Generator Implant Date: 20200615

## 2019-08-08 NOTE — Telephone Encounter (Signed)
Patient reports a "lump " in her left breast over site of LINQ implant. Denies pain,no drainage or discoloration. She reports the raised area occurred 2 weeks ago. Will bring into clinic to check LINQ site today at 1530.

## 2019-08-08 NOTE — Progress Notes (Addendum)
Area where patient feels "lump" palpated and LINQ loop recorder located in that area Patient confirmed that raised area palpated under skin is area concern. No pain, no edema, and no  signs of infection. Patient will call device clinic if anything changes at [facility] implant site.Loop check in clinic. Battery status: OK . R-waves 0.30m. osymptom episodes, 0 tachy episodes, 0 pause episodes, 0 brady episodes. 0 AF episodes (0% burden). Monthly summary reports and ROV prn.

## 2019-08-08 NOTE — Telephone Encounter (Signed)
The pt states she has a lump at the site of her device. The pt denies pain, a little warm. She also denies discoloration. It is just swollen. I let her talk with device nurse Jenny Reichmann, Rn.

## 2019-08-08 NOTE — Telephone Encounter (Signed)
  1. Has your device fired?  2. Is you device beeping?   3. Are you experiencing draining or swelling at device site? Has a lump at site where device was placed   4. Are you calling to see if we received your device transmission?   5. Have you passed out?     Please route to [place]

## 2019-08-09 ENCOUNTER — Ambulatory Visit: Payer: Medicare Other | Admitting: Physical Therapy

## 2019-08-13 ENCOUNTER — Other Ambulatory Visit: Payer: Self-pay

## 2019-08-13 ENCOUNTER — Ambulatory Visit
Admission: RE | Admit: 2019-08-13 | Discharge: 2019-08-13 | Disposition: A | Discharge status: Home / Self Care | Payer: Medicare Other | Source: Ambulatory Visit | Attending: Obstetrics and Gynecology | Admitting: Obstetrics and Gynecology

## 2019-08-13 ENCOUNTER — Encounter (HOSPITAL_BASED_OUTPATIENT_CLINIC_OR_DEPARTMENT_OTHER): Payer: Medicare Other | Admitting: Internal Medicine

## 2019-08-13 DIAGNOSIS — R1900 Intra-abdominal and pelvic swelling, mass and lump, unspecified site: Secondary | ICD-10-CM

## 2019-08-13 DIAGNOSIS — L97512 Non-pressure chronic ulcer of other part of right foot with fat layer exposed: Secondary | ICD-10-CM | POA: Diagnosis not present

## 2019-08-13 DIAGNOSIS — K7460 Unspecified cirrhosis of liver: Secondary | ICD-10-CM | POA: Diagnosis not present

## 2019-08-13 DIAGNOSIS — E11621 Type 2 diabetes mellitus with foot ulcer: Secondary | ICD-10-CM | POA: Diagnosis not present

## 2019-08-13 DIAGNOSIS — E11622 Type 2 diabetes mellitus with other skin ulcer: Secondary | ICD-10-CM | POA: Diagnosis not present

## 2019-08-13 DIAGNOSIS — Z7982 Long term (current) use of aspirin: Secondary | ICD-10-CM | POA: Diagnosis not present

## 2019-08-13 DIAGNOSIS — R188 Other ascites: Secondary | ICD-10-CM | POA: Diagnosis not present

## 2019-08-13 DIAGNOSIS — Z7902 Long term (current) use of antithrombotics/antiplatelets: Secondary | ICD-10-CM | POA: Diagnosis not present

## 2019-08-13 DIAGNOSIS — M86671 Other chronic osteomyelitis, right ankle and foot: Secondary | ICD-10-CM | POA: Diagnosis not present

## 2019-08-13 DIAGNOSIS — I872 Venous insufficiency (chronic) (peripheral): Secondary | ICD-10-CM | POA: Diagnosis not present

## 2019-08-15 ENCOUNTER — Telehealth: Payer: Self-pay | Admitting: Cardiovascular Disease

## 2019-08-15 NOTE — Telephone Encounter (Signed)
Called patient back. She is unsure if she is suppose to be taking Metoprolol succinate 50 mg daily or metoprolol tartrate 25 mg BID or taking both. Patient has been taking metoprolol Succinate, then she stated her GI doctor prescribed her metoprolol tartrated 25 mg BID due to elevated SBP 160 while she was in the office. Patient stated now her SBP is 110. Informed patient to call GI doctor to clarify what medications they want her to take. Patient stated they told her to call her cardiologist. Informed patient since her BP is back down, go back to what she was on and monitor her BP. Informed her if her BP is elevated continuously to call her PCP. Patient has an appointment with Dr. Johnsie Cancel in 2 weeks and can discuss then if still an issue.

## 2019-08-15 NOTE — Telephone Encounter (Signed)
Patient called stating that she is already on metoprolol succinate 31m.  She was recently given another script of metoprolol tartrate 260mbecause she was having BP issue, her GI doctor gave her BP medication. She wants to make sure it's okay to take the additional medication.

## 2019-08-19 ENCOUNTER — Ambulatory Visit (INDEPENDENT_AMBULATORY_CARE_PROVIDER_SITE_OTHER): Payer: Medicare Other | Admitting: *Deleted

## 2019-08-19 DIAGNOSIS — I639 Cerebral infarction, unspecified: Secondary | ICD-10-CM

## 2019-08-19 LAB — CUP PACEART REMOTE DEVICE CHECK
Date Time Interrogation Session: 20210403030738
Implantable Pulse Generator Implant Date: 20200615

## 2019-08-19 NOTE — Progress Notes (Signed)
Tonya, Hunter ([id number]) Visit Report for 08/13/2019 Debridement Details Patient Name: Date of Service: Tonya, Hunter 08/13/2019 11:15 AM Medical Record [id number] Patient Account Number: [id number] Date of Birth/Sex: Treating RN: [birth year] (85 y.o. Tonya Hunter Primary Care Provider: [name] Other Clinician: Referring Provider: Treating Provider/Extender:Cathrine Krizan, [name], [name] in Treatment: 11 Debridement Performed for Wound #2 Right,Medial Toe Great Assessment: Performed By: Physician Ricard Dillon., MD Debridement Type: Debridement Severity of Tissue Pre Fat layer exposed Debridement: Level of Consciousness (Pre- Awake and Alert procedure): Pre-procedure Verification/Time Out Taken: Yes - 12:13 Start Time: 12:13 Pain Control: Lidocaine 5% topical ointment Total Area Debrided (L x W): 1.1 (cm) x 1.3 (cm) = 1.43 (cm) Tissue and other material Viable, Non-Viable, Callus, Slough, Subcutaneous, Skin: Dermis , Skin: Epidermis, debrided: Slough Level: Skin/Subcutaneous Tissue Debridement Description: Excisional Instrument: Curette Bleeding: Moderate Hemostasis Achieved: Silver Nitrate End Time: 12:15 Procedural Pain: 0 Post Procedural Pain: 0 Response to Treatment: Procedure was tolerated well Level of Consciousness Awake and Alert (Post-procedure): Post Debridement Measurements of Total Wound Length: (cm) 1.1 Width: (cm) 1.3 Depth: (cm) 0.3 Volume: (cm) 0.337 Character of Wound/Ulcer Post Improved Debridement: Severity of Tissue Post Debridement: Fat layer exposed Post Procedure Diagnosis Same as Pre-procedure Electronic Signature(s) Signed: 08/14/2019 5:40:01 PM By: [name] RN Signed: 08/19/2019 7:44:34 AM By: Linton Ham MD Entered By: Linton Ham on 08/13/2019 13:06:45 -------------------------------------------------------------------------------- HPI Details Patient Name: Date of Service: Tonya Hunter 08/13/2019 11:15 AM Medical Record [id number] Patient Account Number: [id number] Date of Birth/Sex: Treating RN: 03-16-1935 (85 y.o. Tonya Hunter Primary Care Provider: [name] Other Clinician: Referring Provider: Treating Provider/Extender:Lavon Bothwell, [name], [name] in Treatment: 11 History of Present Illness HPI Description: 05/22/2019 patient is seen today in regard to her bilateral lower extremities this was a referral from her primary care provider they were concerned about areas of bruising and contusion that happened on a regular basis. With that being said the patient really has an issue with her right great toe where she has a significant blister that wraps around to the underside of the toe that has me more concerned to be perfectly honest than the legs themselves do they seem to be doing quite well. I explained in regard to the legs she is good to have ongoing and continued issues with blisters based on the fragility of her skin as long as she is on the Plavix and aspirin this is likely to be an ongoing issue. Honestly what she is going need to do is protect that area. In regard to the toe however that started 2 days ago and she tells me it seems to be getting worse each day. No fevers, chills, nausea, vomiting, or diarrhea. 05/29/2019 on evaluation today patient appears to be doing much better with regard to her leg which in fact appears to be completely healed. That is on the left. In regard to her right great toe this is showing signs of improvement I am very pleased with how things seem to be progressing after we had removed the blistered skin down to an open wound last week. Overall I feel like there is new tissue growth and I am very excited that this in 1 week is doing so well. 06/05/2019 on evaluation today patient appears to be doing actually fairly well in regard to her toe ulcer. Unfortunately she does have a skin tear on the  anterior surface of her right lower extremity which unfortunately is  a little bit rolled under on the edge of the skin tear. Nonetheless I think we may be able to straighten this out and if so that would go a long way to help in this area to heal more effectively and quickly. Fortunately there is no signs of active infection at this time. She does have some swelling but again I think this is secondary to the skin tear as opposed to anything else. 06/12/2019 on evaluation today patient appears to be doing about the same in regard to the toe. With that being said she is having issues with the Steri-Strips having come off on her leg and there was some skin that actually necrosis and did slough off at this point. Fortunately there is no evidence of active infection at this time. She is very concerned about the possibility of amputation she states her daughter is also very concerned. With that being said I have not seen her daughter up to this point. Also have not really realized until today that the patient may have a component of dementia that may be preventing complete compliance and remembering of what needs to be done as far as taking care of her wound. 06/19/2019 on evaluation today patient appears to be doing well with regard to her lower extremity ulcer the toe ulcer is measuring better size wise but does have a little bit more depth. Again I do think we may want proceed with an x- ray at this point in order to further evaluate this and see if there is anything we need to do to aid in getting this to heal more appropriately and quickly. Fortunately there is no signs of systemic infection at this time. 06/26/2019 upon evaluation today patient actually appears to be doing quite well with regard to her lower extremity ulcer and her toe ulcer. I did prescribe her doxycycline in the interim since I last saw her last week and this week. She contacted the office on Friday and wanted to see about getting  something at that point based on the results of the x-ray finding. There did not appear to be any signs of osteomyelitis but there was some evidence potentially of infection. Nonetheless I did send this in for her. 2/15; patient was worked in the clinic today after calling with severe pain in the right great toe. She had an x-ray done last week that was negative. She was put on doxycycline because of possible cellulitis. There is significant erythema in the dorsal aspect of the right great toe. The remaining wound is on the plantar aspect of the interphalangeal joint of the right great toe. Other wounds that were on the lower extremities seem healed. We have been using Iodoflex to the wound on the right great toe The patient has a penicillin allergy dating back into her early 17s at that point in time she developed swelling in the foot and ataxia of gait. She did not have a rash, anaphylactoid issues as far as I can tell and as far she remembers 07/03/19 upon evaluation today patient appears to be doing still poorly in regard to her great toe ulcer. Unfortunately she tells me that she is still having discomfort she did have to see Dr. [name] due to things not doing so well on the 15th which was just a couple of days ago. He did switcher at that time as far as the antibiotics were concerned to a different oral antibiotic. This was Keflex. With that being said she maybe does appear  to be doing somewhat better though she does have a small abscess area in the toe I am still really feeling like there may be something greater going on here I think the patient may need to have them ride to further evaluate the situation. 2/23; patient seems to alternate clinics on a weekly basis. I note that she had an MRI showing a soft tissue ulceration of the medial plantar base of the great toe with underlying early osteomyelitis of the first distal phalanx. I gave her Keflex a week or so ago for what looked to be  cellulitis of the right great toe resistant to doxycycline and this looks a lot better. We have been using silver alginate. 3/2; the patient had lab work done unfortunately she spent a long time in the ER for it which was really not necessary. Her creatinine was 1.03 white count 3.5 hemoglobin 11.9 differential count essentially normal. Sedimentation rate at 32 BMP otherwise normal. C-reactive protein was not elevated at 0.6. The patient started on Levaquin last week she had already had a week of Keflex and perhaps doxycycline before this. My goal is that give her a least 3 or 4 weeks of Levaquin. She is tolerating this well without complaints 3/9; the patient is tolerating the Levaquin well. She will probably need at least another 2 or 3 weeks of this. Wound on the right medial toe looks better. Using silver alginate but I changed to silver collagen today She also scratched herself while putting on her stockings with a nail. She has a new wound on the right medial calf 3/16; the patient is tolerating the Levaquin she will need about another week this will give her 6 weeks of antibiotics. The wound on the right medial toe had eschar around it debris. More concerning than this she had a new satellite lesion more dorsally around the medial aspect. She thinks this came up about 3 days ago. She still has the scratch injury on the left leg from last time 3/23; the patient should have about 3 or 4 more days of Levaquin and not we will give her 6 weeks of treatment. The wound on the right medial toe has thick callus and eschar indicative of unrelieved offloading. She is unsteady on her feet uses a walker unfortunately there is not much alternative here. She had a skin lesion on her right lower lip leg last week which was minor trauma in the setting of severe chronic venous insufficiency. These healed however she has a new 1 more anteriorly. She says this is when her husband was struggling to get her  compression stocking on. 3/30; the patient has completed her antibiotics. She still has thick callus around the wound on the medial right first toe. She also has new punched out areas in her lower anterior tibia apparently coming from trauma when she attempts to put on her 20/30 below-knee stockings. She says both her and her husband are having a lot of difficulty here. Electronic Signature(s) Signed: 08/19/2019 7:44:34 AM By: Linton Ham MD Entered By: Linton Ham on 08/13/2019 13:10:27 -------------------------------------------------------------------------------- Physical Exam Details Patient Name: Date of Service: Tonya Hunter 08/13/2019 11:15 AM Medical Record [id number] Patient Account Number: [id number] Date of Birth/Sex: Treating RN: July 31, 1934 (85 y.o. Tonya Hunter Primary Care Provider: [name] Other Clinician: Referring Provider: Treating Provider/Extender:Marney Treloar, [name], [name] in Treatment: 11 Constitutional Sitting or standing Blood Pressure is within target range for patient.. Pulse regular and within target range for  patient.Marland Kitchen Respirations regular, non-labored and within target range.. Temperature is normal and within the target range for the patient.Marland Kitchen Appears in no distress. Cardiovascular Pedal pulses are palpable on the right. Severe bilateral chronic venous insufficiency with marked hemosiderin deposition. Integumentary (Hair, Skin) Very fragile skin in the right lower extremity. 2 small punched out areas at the same level roughly 1/3 the way up from the ankle. Notes Wound exam; the wound requires a debridement of callus and subcutaneous tissue from around the margin. Minimal bleeding controlled with a single silver nitrate Electronic Signature(s) Signed: 08/19/2019 7:44:34 AM By: Linton Ham MD Entered By: Linton Ham on 08/13/2019  13:11:53 -------------------------------------------------------------------------------- Physician Orders Details Patient Name: Date of Service: Tonya Hunter 08/13/2019 11:15 AM Medical Record [id number] Patient Account Number: [id number] Date of Birth/Sex: Treating RN: 1934/08/24 (85 y.o. Tonya Hunter Primary Care Provider: [name] Other Clinician: Referring Provider: Treating Provider/Extender:Fronie Holstein, [name], [name] in Treatment: 11 Verbal / Phone Orders: No Diagnosis Coding ICD-10 Coding Code Description L97.512 Non-pressure chronic ulcer of other part of right foot with fat layer exposed M86.671 Other chronic osteomyelitis, right ankle and foot I87.2 Venous insufficiency (chronic) (peripheral) S81.801A Unspecified open wound, right lower leg, initial encounter E11.622 Type 2 diabetes mellitus with other skin ulcer Follow-up Appointments Return Appointment in 1 week. Dressing Change Frequency Wound #2 Right,Medial Toe Great Change Dressing every other day. Skin Barriers/Peri-Wound Care Moisturizing lotion - to right leg Wound Cleansing Wound #2 Right,Medial Toe Great Clean wound with Wound Cleanser Primary Wound Dressing Wound #2 Right,Medial Toe Great Hydrofera Blue - moisten with normal saline Wound #6 Right,Distal,Anterior Lower Leg Hydrofera Blue - moisten with normal saline Wound #7 Right,Medial Lower Leg Hydrofera Blue - moisten with normal saline Secondary Dressing Wound #2 Right,Medial Toe Great Kerlix/Rolled Gauze Dry Gauze Edema Control Avoid standing for long periods of time Elevate legs to the level of the heart or above for 30 minutes daily and/or when sitting, a frequency of: - whenever sitting throughout the day Exercise regularly Support Garment 20-30 mm/Hg pressure to: - to both legs daily Off-Loading Open toe surgical shoe to: - RIGHT FOOT felt to shoe Electronic Signature(s) Signed: 08/14/2019 5:40:01  PM By: [name] RN Signed: 08/19/2019 7:44:34 AM By: Linton Ham MD Entered By: [name] on 08/13/2019 12:16:44 -------------------------------------------------------------------------------- Problem List Details Patient Name: Date of Service: Tonya Hunter 08/13/2019 11:15 AM Medical Record [id number] Patient Account Number: [id number] Date of Birth/Sex: Treating RN: [birth year] (85 y.o. Tonya Hunter Primary Care Provider: [name] Other Clinician: Referring Provider: Treating Provider/Extender:Hung Rhinesmith, [name], [name] in Treatment: 11 Active Problems ICD-10 Evaluated Encounter Code Description Active Date Today Diagnosis L97.512 Non-pressure chronic ulcer of other part of right foot 05/22/2019 No Yes with fat layer exposed M86.671 Other chronic osteomyelitis, right ankle and foot 07/09/2019 No Yes I87.2 Venous insufficiency (chronic) (peripheral) 05/22/2019 No Yes S81.801A Unspecified open wound, right lower leg, initial 06/05/2019 No Yes encounter E11.622 Type 2 diabetes mellitus with other skin ulcer 05/22/2019 No Yes Inactive Problems ICD-10 Code Description Active Date Inactive Date L97.822 Non-pressure chronic ulcer of other part of left lower leg with 05/22/2019 05/22/2019 fat layer exposed I10 Essential (primary) hypertension 05/22/2019 05/22/2019 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/21/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 11/18/8113 (congestive) heart failure L03.115 Cellulitis of right lower limb 07/01/2019 07/01/2019 I25.10 Atherosclerotic heart disease of native coronary artery without 05/22/2019 05/22/2019 angina pectoris Resolved Problems Electronic Signature(s) Signed: 08/19/2019 7:44:34 AM By: Linton Ham MD Entered By:  Linton Ham on 08/13/2019 13:06:13 -------------------------------------------------------------------------------- Progress Note Details Patient Name: Date of Service: Tonya, Hunter 08/13/2019 11:15 AM Medical  Record [id number] Patient Account Number: [id number] Date of Birth/Sex: Treating RN: [birth year] (85 y.o. Tonya Hunter Primary Care Provider: [name] Other Clinician: Referring Provider: Treating Provider/Extender:Dalisha Shively, [name], [name] in Treatment: 11 Subjective History of Present Illness (HPI) 05/22/2019 patient is seen today in regard to her bilateral lower extremities this was a referral from her primary care provider they were concerned about areas of bruising and contusion that happened on a regular basis. With that being said the patient really has an issue with her right great toe where she has a significant blister that wraps around to the underside of the toe that has me more concerned to be perfectly honest than the legs themselves do they seem to be doing quite well. I explained in regard to the legs she is good to have ongoing and continued issues with blisters based on the fragility of her skin as long as she is on the Plavix and aspirin this is likely to be an ongoing issue. Honestly what she is going need to do is protect that area. In regard to the toe however that started 2 days ago and she tells me it seems to be getting worse each day. No fevers, chills, nausea, vomiting, or diarrhea. 05/29/2019 on evaluation today patient appears to be doing much better with regard to her leg which in fact appears to be completely healed. That is on the left. In regard to her right great toe this is showing signs of improvement I am very pleased with how things seem to be progressing after we had removed the blistered skin down to an open wound last week. Overall I feel like there is new tissue growth and I am very excited that this in 1 week is doing so well. 06/05/2019 on evaluation today patient appears to be doing actually fairly well in regard to her toe ulcer. Unfortunately she does have a skin tear on the anterior surface of her right lower  extremity which unfortunately is a little bit rolled under on the edge of the skin tear. Nonetheless I think we may be able to straighten this out and if so that would go a long way to help in this area to heal more effectively and quickly. Fortunately there is no signs of active infection at this time. She does have some swelling but again I think this is secondary to the skin tear as opposed to anything else. 06/12/2019 on evaluation today patient appears to be doing about the same in regard to the toe. With that being said she is having issues with the Steri-Strips having come off on her leg and there was some skin that actually necrosis and did slough off at this point. Fortunately there is no evidence of active infection at this time. She is very concerned about the possibility of amputation she states her daughter is also very concerned. With that being said I have not seen her daughter up to this point. Also have not really realized until today that the patient may have a component of dementia that may be preventing complete compliance and remembering of what needs to be done as far as taking care of her wound. 06/19/2019 on evaluation today patient appears to be doing well with regard to her lower extremity ulcer the toe ulcer is measuring better size wise but does have a little bit  more depth. Again I do think we may want proceed with an x- ray at this point in order to further evaluate this and see if there is anything we need to do to aid in getting this to heal more appropriately and quickly. Fortunately there is no signs of systemic infection at this time. 06/26/2019 upon evaluation today patient actually appears to be doing quite well with regard to her lower extremity ulcer and her toe ulcer. I did prescribe her doxycycline in the interim since I last saw her last week and this week. She contacted the office on Friday and wanted to see about getting something at that point based on the  results of the x-ray finding. There did not appear to be any signs of osteomyelitis but there was some evidence potentially of infection. Nonetheless I did send this in for her. 2/15; patient was worked in the clinic today after calling with severe pain in the right great toe. She had an x-ray done last week that was negative. She was put on doxycycline because of possible cellulitis. There is significant erythema in the dorsal aspect of the right great toe. The remaining wound is on the plantar aspect of the interphalangeal joint of the right great toe. Other wounds that were on the lower extremities seem healed. We have been using Iodoflex to the wound on the right great toe The patient has a penicillin allergy dating back into her early 66s at that point in time she developed swelling in the foot and ataxia of gait. She did not have a rash, anaphylactoid issues as far as I can tell and as far she remembers 07/03/19 upon evaluation today patient appears to be doing still poorly in regard to her great toe ulcer. Unfortunately she tells me that she is still having discomfort she did have to see Dr. [name] due to things not doing so well on the 15th which was just a couple of days ago. He did switcher at that time as far as the antibiotics were concerned to a different oral antibiotic. This was Keflex. With that being said she maybe does appear to be doing somewhat better though she does have a small abscess area in the toe I am still really feeling like there may be something greater going on here I think the patient may need to have them ride to further evaluate the situation. 2/23; patient seems to alternate clinics on a weekly basis. I note that she had an MRI showing a soft tissue ulceration of the medial plantar base of the great toe with underlying early osteomyelitis of the first distal phalanx. I gave her Keflex a week or so ago for what looked to be cellulitis of the right great toe  resistant to doxycycline and this looks a lot better. We have been using silver alginate. 3/2; the patient had lab work done unfortunately she spent a long time in the ER for it which was really not necessary. Her creatinine was 1.03 white count 3.5 hemoglobin 11.9 differential count essentially normal. Sedimentation rate at 32 BMP otherwise normal. C-reactive protein was not elevated at 0.6. The patient started on Levaquin last week she had already had a week of Keflex and perhaps doxycycline before this. My goal is that give her a least 3 or 4 weeks of Levaquin. She is tolerating this well without complaints 3/9; the patient is tolerating the Levaquin well. She will probably need at least another 2 or 3 weeks of this. Wound on  the right medial toe looks better. Using silver alginate but I changed to silver collagen today She also scratched herself while putting on her stockings with a nail. She has a new wound on the right medial calf 3/16; the patient is tolerating the Levaquin she will need about another week this will give her 6 weeks of antibiotics. The wound on the right medial toe had eschar around it debris. More concerning than this she had a new satellite lesion more dorsally around the medial aspect. She thinks this came up about 3 days ago. She still has the scratch injury on the left leg from last time 3/23; the patient should have about 3 or 4 more days of Levaquin and not we will give her 6 weeks of treatment. The wound on the right medial toe has thick callus and eschar indicative of unrelieved offloading. She is unsteady on her feet uses a walker unfortunately there is not much alternative here. She had a skin lesion on her right lower lip leg last week which was minor trauma in the setting of severe chronic venous insufficiency. These healed however she has a new 1 more anteriorly. She says this is when her husband was struggling to get her compression stocking on. 3/30; the  patient has completed her antibiotics. She still has thick callus around the wound on the medial right first toe. She also has new punched out areas in her lower anterior tibia apparently coming from trauma when she attempts to put on her 20/30 below-knee stockings. She says both her and her husband are having a lot of difficulty here. Objective Constitutional Sitting or standing Blood Pressure is within target range for patient.. Pulse regular and within target range for patient.Marland Kitchen Respirations regular, non-labored and within target range.. Temperature is normal and within the target range for the patient.Marland Kitchen Appears in no distress. Vitals Time Taken: 11:54 AM, Height: 66 in, Weight: 164 lbs, BMI: 26.5, Temperature: 97.7 F, Pulse: 64 bpm, Respiratory Rate: 18 breaths/min, Blood Pressure: 103/49 mmHg. Cardiovascular Pedal pulses are palpable on the right. Severe bilateral chronic venous insufficiency with marked hemosiderin deposition. General Notes: Wound exam; the wound requires a debridement of callus and subcutaneous tissue from around the margin. Minimal bleeding controlled with a single silver nitrate Integumentary (Hair, Skin) Very fragile skin in the right lower extremity. 2 small punched out areas at the same level roughly 1/3 the way up from the ankle. Wound #2 status is Open. Original cause of wound was Blister. The wound is located on the [employer]. The wound measures 1.1cm length x 1.3cm width x 0.3cm depth; 1.123cm^2 area and 0.337cm^3 volume. There is Fat Layer (Subcutaneous Tissue) Exposed exposed. There is no tunneling or undermining noted. There is a medium amount of serosanguineous drainage noted. The wound margin is epibole. There is medium (34-66%) pink granulation within the wound bed. There is a medium (34-66%) amount of necrotic tissue within the wound bed including Adherent Slough. Wound #6 status is Open. Original cause of wound was Trauma. The wound is  located on the Right,Distal,Anterior Lower Leg. The wound measures 0.4cm length x 0.3cm width x 0.1cm depth; 0.094cm^2 area and 0.009cm^3 volume. There is no tunneling or undermining noted. There is a none present amount of drainage noted. The wound margin is flat and intact. There is no granulation within the wound bed. There is no necrotic tissue within the wound bed. General Notes: fibrin noted to wound bed. Wound #7 status is Open. Original cause of  wound was Trauma. The wound is located on the Right,Medial Lower Leg. The wound measures 0.3cm length x 0.3cm width x 0.1cm depth; 0.071cm^2 area and 0.007cm^3 volume. There is no tunneling or undermining noted. There is a small amount of serosanguineous drainage noted. The wound margin is distinct with the outline attached to the wound base. There is no granulation within the wound bed. There is no necrotic tissue within the wound bed. General Notes: fibrin noted to wound bed. 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 There was a Excisional Skin/Subcutaneous Tissue Debridement with a total area of 1.43 sq cm performed by Ricard Dillon., MD. With the following instrument(s): Curette to remove Viable and Non-Viable tissue/material. Material removed includes Callus, Subcutaneous Tissue, Slough, Skin: Dermis, and Skin: Epidermis after achieving pain control using Lidocaine 5% topical ointment. No specimens were taken. A time out was conducted at 12:13, prior to the start of  the procedure. A Moderate amount of bleeding was controlled with Silver Nitrate. The procedure was tolerated well with a pain level of 0 throughout and a pain level of 0 following the procedure. Post Debridement Measurements: 1.1cm length x 1.3cm width x 0.3cm depth; 0.337cm^3 volume. Character of Wound/Ulcer Post Debridement is improved. Severity of Tissue Post Debridement is: Fat layer exposed. Post procedure Diagnosis Wound #2: Same as Pre-Procedure Plan Follow-up Appointments: Return Appointment in 1 week. Dressing Change Frequency: Wound #2 Right,Medial Toe Great: Change Dressing every other day. Skin Barriers/Peri-Wound Care: Moisturizing lotion - to right leg Wound Cleansing: Wound #2 Right,Medial Toe Great: Clean wound with Wound Cleanser Primary Wound Dressing: Wound #2 Right,Medial Toe Great: Hydrofera Blue - moisten with normal saline Wound #6 Right,Distal,Anterior Lower Leg: Hydrofera Blue - moisten with normal saline Wound #7 Right,Medial Lower Leg: Hydrofera Blue - moisten with normal saline Secondary Dressing: Wound #2 Right,Medial Toe Great: Kerlix/Rolled Gauze Dry Gauze Edema Control: Avoid standing for long periods of time Elevate legs to the level of the heart or above for 30 minutes daily and/or when sitting, a frequency of: - whenever sitting throughout the day Exercise regularly Support Garment 20-30 mm/Hg pressure to: - to both legs daily Off-Loading: Open toe surgical shoe to: - RIGHT FOOT felt to shoe 1. I change the primary dressing to [facility] as we seem to be stalled with collagen although the issue may be an adequate offloading. 2. We are using a surgical shoe with felt offloading we will try to pad this off all the more 3. I do not know that she could handle anything more aggressive to offload this toe although we will try to look at a forefoot offloading boot next time. 4. We are going to order her juxta lite stockings bilaterally. Clearly  20/30 below-knee stockings are just not going to work given the fragility of her skin on the right lower extremity Electronic Signature(s) Signed: 08/19/2019 7:44:34 AM By: Linton Ham MD Entered By: Linton Ham on 08/13/2019 13:13:12 -------------------------------------------------------------------------------- SuperBill Details Patient Name: Date of Service: Tonya Hunter 08/13/2019 Medical Record [id number] Patient Account Number: [id number] Date of Birth/Sex: Treating RN: 17-Dec-1934 (85 y.o. F)  [name] Primary Care Provider: [name] Other Clinician: Referring Provider: Treating Provider/Extender:Raynor Calcaterra, [name], [name] in Treatment: 11 Diagnosis Coding ICD-10 Codes Code Description [phone] Non-pressure chronic ulcer of other part of right foot with fat layer exposed M86.671 Other chronic osteomyelitis, right ankle and foot I87.2 Venous insufficiency (chronic) (peripheral) S81.801A Unspecified open wound, right lower leg, initial encounter E11.622 Type 2 diabetes mellitus with other skin ulcer Facility Procedures CPT4 Code Description: [id number] - DEB SUBQ TISSUE 20 SQ CM/< ICD-10 Diagnosis Description L97.512 Non-pressure chronic ulcer of other part of right foot with Modifier: fat layer e Quantity: 1 xposed Physician Procedures CPT4 Code Description: [id number] - WC PHYS SUBQ TISS 20 SQ CM ICD-10 Diagnosis Description L97.512 Non-pressure chronic ulcer of other part of right foot with Modifier: fat layer e Quantity: 1 xposed Electronic Signature(s) Signed: 08/19/2019 7:44:34 AM By: Linton Ham MD Entered By: Linton Ham on 08/13/2019 13:13:28

## 2019-08-20 ENCOUNTER — Other Ambulatory Visit: Payer: Self-pay

## 2019-08-20 ENCOUNTER — Encounter (HOSPITAL_BASED_OUTPATIENT_CLINIC_OR_DEPARTMENT_OTHER): Payer: Medicare Other | Attending: Internal Medicine | Admitting: Internal Medicine

## 2019-08-20 DIAGNOSIS — I5042 Chronic combined systolic (congestive) and diastolic (congestive) heart failure: Secondary | ICD-10-CM | POA: Insufficient documentation

## 2019-08-20 DIAGNOSIS — E11622 Type 2 diabetes mellitus with other skin ulcer: Secondary | ICD-10-CM | POA: Diagnosis not present

## 2019-08-20 DIAGNOSIS — E1169 Type 2 diabetes mellitus with other specified complication: Secondary | ICD-10-CM | POA: Insufficient documentation

## 2019-08-20 DIAGNOSIS — I872 Venous insufficiency (chronic) (peripheral): Secondary | ICD-10-CM | POA: Insufficient documentation

## 2019-08-20 DIAGNOSIS — I2510 Atherosclerotic heart disease of native coronary artery without angina pectoris: Secondary | ICD-10-CM | POA: Insufficient documentation

## 2019-08-20 DIAGNOSIS — I110 Hypertensive heart disease with heart failure: Secondary | ICD-10-CM | POA: Diagnosis not present

## 2019-08-20 DIAGNOSIS — E11621 Type 2 diabetes mellitus with foot ulcer: Secondary | ICD-10-CM | POA: Diagnosis not present

## 2019-08-20 DIAGNOSIS — L97512 Non-pressure chronic ulcer of other part of right foot with fat layer exposed: Secondary | ICD-10-CM | POA: Diagnosis not present

## 2019-08-20 DIAGNOSIS — E118 Type 2 diabetes mellitus with unspecified complications: Secondary | ICD-10-CM | POA: Diagnosis not present

## 2019-08-20 DIAGNOSIS — M86671 Other chronic osteomyelitis, right ankle and foot: Secondary | ICD-10-CM | POA: Diagnosis not present

## 2019-08-20 DIAGNOSIS — L97819 Non-pressure chronic ulcer of other part of right lower leg with unspecified severity: Secondary | ICD-10-CM | POA: Insufficient documentation

## 2019-08-20 DIAGNOSIS — L97519 Non-pressure chronic ulcer of other part of right foot with unspecified severity: Secondary | ICD-10-CM | POA: Diagnosis present

## 2019-08-20 NOTE — Progress Notes (Signed)
ILR Remote 
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
## 2019-08-21 NOTE — Progress Notes (Signed)
Tonya Hunter, Tonya Hunter ([id number]) Visit Report for 08/20/2019 HPI Details Patient Name: Date of Service: Tonya, Hunter 08/20/2019 1:45 PM Medical Record [id number] Patient Account Number: [id number] Date of Birth/Sex: Treating RN: November 14, 1934 (85 y.o. Tonya Hunter Primary Care Provider: Geoffery Hunter Other Clinician: Referring Provider: Treating Provider/Extender:Tonya Hunter in Treatment: 12 History of Present Illness HPI Description: 05/22/2019 patient is seen today in regard to her bilateral lower extremities this was a referral from her primary care provider they were concerned about areas of bruising and contusion that happened on a regular basis. With that being said the patient really has an issue with her right great toe where she has a significant blister that wraps around to the underside of the toe that has me more concerned to be perfectly honest than the legs themselves do they seem to be doing quite well. I explained in regard to the legs she is good to have ongoing and continued issues with blisters based on the fragility of her skin as long as she is on the Plavix and aspirin this is likely to be an ongoing issue. Honestly what she is going need to do is protect that area. In regard to the toe however that started 2 days ago and she tells me it seems to be getting worse each day. No fevers, chills, nausea, vomiting, or diarrhea. 05/29/2019 on evaluation today patient appears to be doing much better with regard to her leg which in fact appears to be completely healed. That is on the left. In regard to her right great toe this is showing signs of improvement I am very pleased with how things seem to be progressing after we had removed the blistered skin down to an open wound last week. Overall I feel like there is new tissue growth and I am very excited that this in 1 week is doing so well. 06/05/2019 on evaluation today patient appears to be  doing actually fairly well in regard to her toe ulcer. Unfortunately she does have a skin tear on the anterior surface of her right lower extremity which unfortunately is a little bit rolled under on the edge of the skin tear. Nonetheless I think we may be able to straighten this out and if so that would go a long way to help in this area to heal more effectively and quickly. Fortunately there is no signs of active infection at this time. She does have some swelling but again I think this is secondary to the skin tear as opposed to anything else. 06/12/2019 on evaluation today patient appears to be doing about the same in regard to the toe. With that being said she is having issues with the Steri-Strips having come off on her leg and there was some skin that actually necrosis and did slough off at this point. Fortunately there is no evidence of active infection at this time. She is very concerned about the possibility of amputation she states her daughter is also very concerned. With that being said I have not seen her daughter up to this point. Also have not really realized until today that the patient may have a component of dementia that may be preventing complete compliance and remembering of what needs to be done as far as taking care of her wound. 06/19/2019 on evaluation today patient appears to be doing well with regard to her lower extremity ulcer the toe ulcer is measuring better size wise but does have a little bit  more depth. Again I do think we may want proceed with an x- ray at this point in order to further evaluate this and see if there is anything we need to do to aid in getting this to heal more appropriately and quickly. Fortunately there is no signs of systemic infection at this time. 06/26/2019 upon evaluation today patient actually appears to be doing quite well with regard to her lower extremity ulcer and her toe ulcer. I did prescribe her doxycycline in the interim since I last  saw her last week and this week. She contacted the office on Friday and wanted to see about getting something at that point based on the results of the x-ray finding. There did not appear to be any signs of osteomyelitis but there was some evidence potentially of infection. Nonetheless I did send this in for her. 2/15; patient was worked in the clinic today after calling with severe pain in the right great toe. She had an x-ray done last week that was negative. She was put on doxycycline because of possible cellulitis. There is significant erythema in the dorsal aspect of the right great toe. The remaining wound is on the plantar aspect of the interphalangeal joint of the right great toe. Other wounds that were on the lower extremities seem healed. We have been using Iodoflex to the wound on the right great toe The patient has a penicillin allergy dating back into her early 11s at that point in time she developed swelling in the foot and ataxia of gait. She did not have a rash, anaphylactoid issues as far as I can tell and as far she remembers 07/03/19 upon evaluation today patient appears to be doing still poorly in regard to her great toe ulcer. Unfortunately she tells me that she is still having discomfort she did have to see Tonya Hunter due to things not doing so well on the 15th which was just a couple of days ago. He did switcher at that time as far as the antibiotics were concerned to a different oral antibiotic. This was Keflex. With that being said she maybe does appear to be doing somewhat better though she does have a small abscess area in the toe I am still really feeling like there may be something greater going on here I think the patient may need to have them ride to further evaluate the situation. 2/23; patient seems to alternate clinics on a weekly basis. I note that she had an MRI showing a soft tissue ulceration of the medial plantar base of the great toe with underlying early  osteomyelitis of the first distal phalanx. I gave her Keflex a week or so ago for what looked to be cellulitis of the right great toe resistant to doxycycline and this looks a lot better. We have been using silver alginate. 3/2; the patient had lab work done unfortunately she spent a long time in the ER for it which was really not necessary. Her creatinine was 1.03 white count 3.5 hemoglobin 11.9 differential count essentially normal. Sedimentation rate at 32 BMP otherwise normal. C-reactive protein was not elevated at 0.6. The patient started on Levaquin last week she had already had a week of Keflex and perhaps doxycycline before this. My goal is that give her a least 3 or 4 weeks of Levaquin. She is tolerating this well without complaints 3/9; the patient is tolerating the Levaquin well. She will probably need at least another 2 or 3 weeks of this. Wound on  the right medial toe looks better. Using silver alginate but I changed to silver collagen today She also scratched herself while putting on her stockings with a nail. She has a new wound on the right medial calf 3/16; the patient is tolerating the Levaquin she will need about another week this will give her 6 weeks of antibiotics. The wound on the right medial toe had eschar around it debris. More concerning than this she had a new satellite lesion more dorsally around the medial aspect. She thinks this came up about 3 days ago. She still has the scratch injury on the left leg from last time 3/23; the patient should have about 3 or 4 more days of Levaquin and not we will give her 6 weeks of treatment. The wound on the right medial toe has thick callus and eschar indicative of unrelieved offloading. She is unsteady on her feet uses a walker unfortunately there is not much alternative here. She had a skin lesion on her right lower lip leg last week which was minor trauma in the setting of severe chronic venous insufficiency. These healed  however she has a new 1 more anteriorly. She says this is when her husband was struggling to get her compression stocking on. 3/30; the patient has completed her antibiotics. She still has thick callus around the wound on the medial right first toe. She also has new punched out areas in her lower anterior tibia apparently coming from trauma when she attempts to put on her 20/30 below-knee stockings. She says both her and her husband are having a lot of difficulty here. 4/6; patient has completed antibiotics for osteomyelitis of the right great toe. The area on the lower anterior tibia in the setting of severe chronic venous insufficiency is just about closed. Electronic Signature(s) Signed: 08/20/2019 6:13:52 PM By: Linton Ham MD Entered By: Linton Ham on 08/20/2019 15:31:15 -------------------------------------------------------------------------------- Physical Exam Details Patient Name: Date of Service: [name] 08/20/2019 1:45 PM Medical Record [id number] Patient Account Number: [id number] Date of Birth/Sex: Treating RN: [birth year] (85 y.o. Tonya Hunter Primary Care Provider: Geoffery Hunter Other Clinician: Referring Provider: Treating Provider/Extender:Tonya Hunter in Treatment: 12 Constitutional Sitting or standing Blood Pressure is within target range for patient.. Pulse regular and within target range for patient.Marland Kitchen Respirations regular, non-labored and within target range.. Temperature is normal and within the target range for the patient.Marland Kitchen Appears in no distress. Respiratory work of breathing is normal. Cardiovascular Pedal pulses are palpable bilaterally. Integumentary (Hair, Skin) Severe chronic venous hypertension with hemosiderin deposition. Notes Wound exam; the area on the medial part of her right great toe. Surface looks healthy although there is some depth. Divot in the middle of the wound but it does not probe  to bone. There is less surrounding callus. There is nothing open on the left leg. Small remaining wound on the right leg. Severe chronic venous changes in both legs. Electronic Signature(s) Signed: 08/20/2019 6:13:52 PM By: Linton Ham MD Entered By: Linton Ham on 08/20/2019 15:36:26 -------------------------------------------------------------------------------- Physician Orders Details Patient Name: Date of Service: [name] 08/20/2019 1:45 PM Medical Record [id number] Patient Account Number: [id number] Date of Birth/Sex: Treating RN: [birth year] (85 y.o. Tonya Hunter Primary Care Provider: Geoffery Hunter Other Clinician: Referring Provider: Treating Provider/Extender:Tonya Hunter in Treatment: 12 Verbal / Phone Orders: No Diagnosis Coding ICD-10 Coding Code Description L97.512 Non-pressure chronic ulcer of other part of right foot with fat layer exposed  N46.270 Other chronic osteomyelitis, right ankle and foot I87.2 Venous insufficiency (chronic) (peripheral) S81.801A Unspecified open wound, right lower leg, initial encounter E11.622 Type 2 diabetes mellitus with other skin ulcer Follow-up Appointments Return Appointment in 2 weeks. Dressing Change Frequency Wound #2 Right,Medial Toe Great Change Dressing every other day. Skin Barriers/Peri-Wound Care Moisturizing lotion - to right leg Wound Cleansing Wound #2 Right,Medial Toe Great Clean wound with Wound Cleanser Primary Wound Dressing Wound #2 Right,Medial Toe Great Hydrofera Blue - moisten with normal saline Secondary Dressing Wound #2 Right,Medial Toe Great Kerlix/Rolled Gauze Dry Gauze Edema Control Avoid standing for long periods of time Elevate legs to the level of the heart or above for 30 minutes daily and/or when sitting, a frequency of: - whenever sitting throughout the day Exercise regularly Support Garment 20-30 mm/Hg pressure to: - to both legs  daily Off-Loading Open toe surgical shoe to: - RIGHT FOOT felt to shoe Electronic Signature(s) Signed: 08/20/2019 6:13:52 PM By: Linton Ham MD Signed: 08/20/2019 6:43:44 PM By: [name] RN Entered By: [name] on 08/20/2019 14:50:31 -------------------------------------------------------------------------------- Problem List Details Patient Name: Date of Service: [name] 08/20/2019 1:45 PM Medical Record [id number] Patient Account Number: [id number] Date of Birth/Sex: Treating RN: [birth year] (85 y.o. Tonya Hunter Primary Care Provider: Geoffery Hunter Other Clinician: Referring Provider: Treating Provider/Extender:Tonya Hunter in Treatment: 12 Active Problems ICD-10 Evaluated Encounter Code Description Active Date Today Diagnosis L97.512 Non-pressure chronic ulcer of other part of right foot 05/22/2019 No Yes with fat layer exposed M86.671 Other chronic osteomyelitis, right ankle and foot 07/09/2019 No Yes I87.2 Venous insufficiency (chronic) (peripheral) 05/22/2019 No Yes S81.801A Unspecified open wound, right lower leg, initial 06/05/2019 No Yes encounter E11.622 Type 2 diabetes mellitus with other skin ulcer 05/22/2019 No Yes Inactive Problems ICD-10 Code Description Active Date Inactive Date L97.822 Non-pressure chronic ulcer of other part of left lower leg with 05/22/2019 05/22/2019 fat layer exposed I10 Essential (primary) hypertension 05/22/2019 05/22/2019 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 07/22/1015 (congestive) heart failure L03.115 Cellulitis of right lower limb 07/01/2019 07/01/2019 I25.10 Atherosclerotic heart disease of native coronary artery without 05/22/2019 05/22/2019 angina pectoris Resolved Problems Electronic Signature(s) Signed: 08/20/2019 6:13:52 PM By: Linton Ham MD Entered By: Linton Ham on 08/20/2019  15:30:07 -------------------------------------------------------------------------------- Progress Note Details Patient Name: Date of Service: [name] 08/20/2019 1:45 PM Medical Record [id number] Patient Account Number: [id number] Date of Birth/Sex: Treating RN: 11/27/1934 (85 y.o. Tonya Hunter Primary Care Provider: Geoffery Hunter Other Clinician: Referring Provider: Treating Provider/Extender:Tonya Hunter in Treatment: 12 Subjective History of Present Illness (HPI) 05/22/2019 patient is seen today in regard to her bilateral lower extremities this was a referral from her primary care provider they were concerned about areas of bruising and contusion that happened on a regular basis. With that being said the patient really has an issue with her right great toe where she has a significant blister that wraps around to the underside of the toe that has me more concerned to be perfectly honest than the legs themselves do they seem to be doing quite well. I explained in regard to the legs she is good to have ongoing and continued issues with blisters based on the fragility of her skin as long as she is on the Plavix and aspirin this is likely to be an ongoing issue. Honestly what she is going need to do is protect that area. In regard to the toe however  that started 2 days ago and she tells me it seems to be getting worse each day. No fevers, chills, nausea, vomiting, or diarrhea. 05/29/2019 on evaluation today patient appears to be doing much better with regard to her leg which in fact appears to be completely healed. That is on the left. In regard to her right great toe this is showing signs of improvement I am very pleased with how things seem to be progressing after we had removed the blistered skin down to an open wound last week. Overall I feel like there is new tissue growth and I am very excited that this in 1 week is doing  so well. 06/05/2019 on evaluation today patient appears to be doing actually fairly well in regard to her toe ulcer. Unfortunately she does have a skin tear on the anterior surface of her right lower extremity which unfortunately is a little bit rolled under on the edge of the skin tear. Nonetheless I think we may be able to straighten this out and if so that would go a long way to help in this area to heal more effectively and quickly. Fortunately there is no signs of active infection at this time. She does have some swelling but again I think this is secondary to the skin tear as opposed to anything else. 06/12/2019 on evaluation today patient appears to be doing about the same in regard to the toe. With that being said she is having issues with the Steri-Strips having come off on her leg and there was some skin that actually necrosis and did slough off at this point. Fortunately there is no evidence of active infection at this time. She is very concerned about the possibility of amputation she states her daughter is also very concerned. With that being said I have not seen her daughter up to this point. Also have not really realized until today that the patient may have a component of dementia that may be preventing complete compliance and remembering of what needs to be done as far as taking care of her wound. 06/19/2019 on evaluation today patient appears to be doing well with regard to her lower extremity ulcer the toe ulcer is measuring better size wise but does have a little bit more depth. Again I do think we may want proceed with an x- ray at this point in order to further evaluate this and see if there is anything we need to do to aid in getting this to heal more appropriately and quickly. Fortunately there is no signs of systemic infection at this time. 06/26/2019 upon evaluation today patient actually appears to be doing quite well with regard to her lower extremity ulcer and her toe  ulcer. I did prescribe her doxycycline in the interim since I last saw her last week and this week. She contacted the office on Friday and wanted to see about getting something at that point based on the results of the x-ray finding. There did not appear to be any signs of osteomyelitis but there was some evidence potentially of infection. Nonetheless I did send this in for her. 2/15; patient was worked in the clinic today after calling with severe pain in the right great toe. She had an x-ray done last week that was negative. She was put on doxycycline because of possible cellulitis. There is significant erythema in the dorsal aspect of the right great toe. The remaining wound is on the plantar aspect of the interphalangeal joint of the right great  toe. Other wounds that were on the lower extremities seem healed. We have been using Iodoflex to the wound on the right great toe The patient has a penicillin allergy dating back into her early 66s at that point in time she developed swelling in the foot and ataxia of gait. She did not have a rash, anaphylactoid issues as far as I can tell and as far she remembers 07/03/19 upon evaluation today patient appears to be doing still poorly in regard to her great toe ulcer. Unfortunately she tells me that she is still having discomfort she did have to see Tonya Hunter due to things not doing so well on the 15th which was just a couple of days ago. He did switcher at that time as far as the antibiotics were concerned to a different oral antibiotic. This was Keflex. With that being said she maybe does appear to be doing somewhat better though she does have a small abscess area in the toe I am still really feeling like there may be something greater going on here I think the patient may need to have them ride to further evaluate the situation. 2/23; patient seems to alternate clinics on a weekly basis. I note that she had an MRI showing a soft tissue ulceration of  the medial plantar base of the great toe with underlying early osteomyelitis of the first distal phalanx. I gave her Keflex a week or so ago for what looked to be cellulitis of the right great toe resistant to doxycycline and this looks a lot better. We have been using silver alginate. 3/2; the patient had lab work done unfortunately she spent a long time in the ER for it which was really not necessary. Her creatinine was 1.03 white count 3.5 hemoglobin 11.9 differential count essentially normal. Sedimentation rate at 32 BMP otherwise normal. C-reactive protein was not elevated at 0.6. The patient started on Levaquin last week she had already had a week of Keflex and perhaps doxycycline before this. My goal is that give her a least 3 or 4 weeks of Levaquin. She is tolerating this well without complaints 3/9; the patient is tolerating the Levaquin well. She will probably need at least another 2 or 3 weeks of this. Wound on the right medial toe looks better. Using silver alginate but I changed to silver collagen today She also scratched herself while putting on her stockings with a nail. She has a new wound on the right medial calf 3/16; the patient is tolerating the Levaquin she will need about another week this will give her 6 weeks of antibiotics. The wound on the right medial toe had eschar around it debris. More concerning than this she had a new satellite lesion more dorsally around the medial aspect. She thinks this came up about 3 days ago. She still has the scratch injury on the left leg from last time 3/23; the patient should have about 3 or 4 more days of Levaquin and not we will give her 6 weeks of treatment. The wound on the right medial toe has thick callus and eschar indicative of unrelieved offloading. She is unsteady on her feet uses a walker unfortunately there is not much alternative here. She had a skin lesion on her right lower lip leg last week which was minor trauma in the  setting of severe chronic venous insufficiency. These healed however she has a new 1 more anteriorly. She says this is when her husband was struggling to get her compression  stocking on. 3/30; the patient has completed her antibiotics. She still has thick callus around the wound on the medial right first toe. She also has new punched out areas in her lower anterior tibia apparently coming from trauma when she attempts to put on her 20/30 below-knee stockings. She says both her and her husband are having a lot of difficulty here. 4/6; patient has completed antibiotics for osteomyelitis of the right great toe. The area on the lower anterior tibia in the setting of severe chronic venous insufficiency is just about closed. Objective Constitutional Sitting or standing Blood Pressure is within target range for patient.. Pulse regular and within target range for patient.Marland Kitchen Respirations regular, non-labored and within target range.. Temperature is normal and within the target range for the patient.Marland Kitchen Appears in no distress. Vitals Time Taken: 2:10 PM, Height: 66 in, Source: Stated, Weight: 164 lbs, Source: Stated, BMI: 26.5, Temperature: 98.1 F, Pulse: 67 bpm, Respiratory Rate: 18 breaths/min, Blood Pressure: 130/62 mmHg, Capillary Blood Glucose: 131 mg/dl. General Notes: glucose per pt report this am Respiratory work of breathing is normal. Cardiovascular Pedal pulses are palpable bilaterally. General Notes: Wound exam; the area on the medial part of her right great toe. Surface looks healthy although there is some depth. Divot in the middle of the wound but it does not probe to bone. There is less surrounding callus. There is nothing open on the left leg. Small remaining wound on the right leg. Severe chronic venous changes in both legs. Integumentary (Hair, Skin) Severe chronic venous hypertension with hemosiderin deposition. Wound #2 status is Open. Original cause of wound was Blister. The  wound is located on the [employer]. The wound measures 1.1cm length x 0.8cm width x 0.4cm depth; 0.691cm^2 area and 0.276cm^3 volume. There is Fat Layer (Subcutaneous Tissue) Exposed exposed. There is no tunneling or undermining noted. There is a small amount of serosanguineous drainage noted. The wound margin is thickened. There is large (67-100%) red, pale granulation within the wound bed. There is a small (1-33%) amount of necrotic tissue within the wound bed including Adherent Slough. Wound #6 status is Open. Original cause of wound was Trauma. The wound is located on the Right,Distal,Anterior Lower Leg. The wound measures 0cm length x 0cm width x 0cm depth; 0cm^2 area and 0cm^3 volume. There is no tunneling or undermining noted. There is a none present amount of drainage noted. The wound margin is flat and intact. There is no granulation within the wound bed. There is no necrotic tissue within the wound bed. Wound #7 status is Open. Original cause of wound was Trauma. The wound is located on the Right,Medial Lower Leg. The wound measures 0.1cm length x 0.1cm width x 0.1cm depth; 0.008cm^2 area and 0.001cm^3 volume. There is no tunneling or undermining noted. There is a small amount of serous drainage noted. The wound margin is distinct with the outline attached to the wound base. There is large (67-100%) red granulation within the wound bed. There is no necrotic tissue within the wound bed. 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 Dressing Change Frequency: Wound #2 Right,Medial Toe  Great: Change Dressing every other day. Skin Barriers/Peri-Wound Care: Moisturizing lotion -  to right leg Wound Cleansing: Wound #2 Right,Medial Toe Great: Clean wound with Wound Cleanser Primary Wound Dressing: Wound #2 Right,Medial Toe Great: Hydrofera Blue - moisten with normal saline Secondary Dressing: Wound #2 Right,Medial Toe Great: Kerlix/Rolled Gauze Dry Gauze Edema Control: Avoid standing for long periods of time Elevate legs to the level of the heart or above for 30 minutes daily and/or when sitting, a frequency of: - whenever sitting throughout the day Exercise regularly Support Garment 20-30 mm/Hg pressure to: - to both legs daily Off-Loading: Open toe surgical shoe to: - RIGHT FOOT felt to shoe 1. Continue with silver collagen to the right great toe 2. She is going to need support garment stockings to both legs. She has paperthin skin secondary to chronic venous insufficiency Electronic Signature(s) Signed: 08/20/2019 6:13:52 PM By: Linton Ham MD Entered By: Linton Ham on 08/20/2019 15:38:29 -------------------------------------------------------------------------------- SuperBill Details Patient Name: Date of Service: [name] 08/20/2019 Medical Record [id number] Patient Account Number: [id number] Date of Birth/Sex: Treating RN: September 09, 1934 (85 y.o. Tonya Hunter Primary Care Provider: Geoffery Hunter Other Clinician: Referring Provider: Treating Provider/Extender:Tonya Hunter in Treatment: 12 Diagnosis Coding ICD-10 Codes Code Description [phone] Non-pressure chronic ulcer of other part of right foot with fat layer exposed M86.671 Other chronic osteomyelitis, right ankle and foot I87.2 Venous insufficiency (chronic) (peripheral) S81.801A Unspecified open wound, right lower leg, initial encounter E11.622 Type 2 diabetes mellitus with other skin ulcer Facility Procedures CPT4 Code:  02233612 Description: 99213 - WOUND CARE VISIT-LEV 3 EST PT Modifier: Quantity: 1 Physician Procedures CPT4 Code Description: [id number] [place] - WC PHYS LEVEL 3 - EST PT ICD-10 Diagnosis Description L97.512 Non-pressure chronic ulcer of other part of right foot with M86.671 Other chronic osteomyelitis, right ankle and foot Modifier: fat layer e Quantity: 1 xposed Electronic Signature(s) Signed: 08/20/2019 6:13:52 PM By: Linton Ham MD Entered By: Linton Ham on 08/20/2019 15:38:49

## 2019-08-26 ENCOUNTER — Other Ambulatory Visit: Payer: Self-pay

## 2019-08-26 ENCOUNTER — Ambulatory Visit (INDEPENDENT_AMBULATORY_CARE_PROVIDER_SITE_OTHER): Payer: Medicare Other | Admitting: Ophthalmology

## 2019-08-26 ENCOUNTER — Encounter (INDEPENDENT_AMBULATORY_CARE_PROVIDER_SITE_OTHER): Payer: Self-pay | Admitting: Ophthalmology

## 2019-08-26 DIAGNOSIS — E113411 Type 2 diabetes mellitus with severe nonproliferative diabetic retinopathy with macular edema, right eye: Secondary | ICD-10-CM | POA: Diagnosis not present

## 2019-08-26 DIAGNOSIS — H43812 Vitreous degeneration, left eye: Secondary | ICD-10-CM

## 2019-08-26 DIAGNOSIS — H35362 Drusen (degenerative) of macula, left eye: Secondary | ICD-10-CM | POA: Diagnosis not present

## 2019-08-26 DIAGNOSIS — E113412 Type 2 diabetes mellitus with severe nonproliferative diabetic retinopathy with macular edema, left eye: Secondary | ICD-10-CM | POA: Diagnosis not present

## 2019-08-26 DIAGNOSIS — H353131 Nonexudative age-related macular degeneration, bilateral, early dry stage: Secondary | ICD-10-CM | POA: Diagnosis not present

## 2019-08-26 NOTE — Assessment & Plan Note (Signed)
The nature of dry age related macular degeneration was discussed with the patient as well as its possible conversion to wet. The results of the AREDS 2 study was discussed with the patient. A diet rich in dark leafy green vegetables was advised and specific recommendations were made regarding supplements with AREDS 2 formulation . Control of hypertension and serum cholesterol may slow the disease. Smoking cessation is mandatory to slow the disease and diminish the risk of progressing to wet age related macular degeneration. The patient was instructed in the use of an [place] and was told to return immediately for any changes in the Grid. Stressed to the patient do not rub eyes
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
## 2019-08-26 NOTE — Assessment & Plan Note (Signed)
The nature of regressed diabetic macular edema was discussed with the patient as well as the possibility of recurrence. The importance of the tight glucose, blood pressure, and serum lipid control were emphasized. The importance of compliance with follow up was emphasized. The patient was advised to avoid smoking.  Currently the swelling in macula is controlled, and improved from prior visits.

## 2019-08-26 NOTE — Assessment & Plan Note (Signed)
The nature of posterior vitreous detachment was discussed with the patient as well as its physiology, its age prevalence, and its possible implication regarding retinal breaks and detachment.  An informational brochure was given to the patient.  All the patient's questions were answered.  The patient was asked to return if new or different flashes or floaters develops.   Patient was instructed to contact office immediately if any changes were noticed. I explained to the patient that vitreous inside the eye is similar to jello inside a bowl. As the jello melts it can start to pull away from the bowl, similarly the vitreous throughout our lives can begin to pull away from the retina. That process is called a posterior vitreous detachment. In some cases, the vitreous can tug hard enough on the retina to form a retinal tear. I discussed with the patient the signs and symptoms of a retinal detachment.  Do not rub the eye.

## 2019-08-27 ENCOUNTER — Encounter (HOSPITAL_BASED_OUTPATIENT_CLINIC_OR_DEPARTMENT_OTHER): Payer: Medicare Other | Admitting: Internal Medicine
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
## 2019-08-28 NOTE — Progress Notes (Signed)
EMIRETH, COCKERHAM ([id number]) Visit Report for 08/06/2019 Arrival Information Details Patient Name: Date of Service: Tonya Hunter 08/06/2019 10:45 AM Medical Record [id number] Patient Account Number: [id number] Date of Birth/Sex: Treating RN: 09/28/1934 (85 y.o. Tonya Hunter Primary Care : [name] Other Clinician: Referring : Treating /Extender:[name], [name], [name] in Treatment: 10 Visit Information History Since Last Visit Added or deleted any medications: No Patient Arrived: Tonya Hunter Any new allergies or adverse reactions: No Arrival Time: 10:56 Had a fall or experienced change in No Accompanied By: self activities of daily living that may affect Transfer Assistance: None risk of falls: Patient Identification Verified: Yes Signs or symptoms of abuse/neglect since last No Secondary Verification Process Yes visito Completed: Hospitalized since last visit: No Patient Requires Transmission-Based No Implantable device outside of the clinic excluding No Precautions: cellular tissue based products placed in the center Patient Has Alerts: Yes since last visit: Patient Alerts: Patient on Blood Has Dressing in Place as Prescribed: Yes Thinner Pain Present Now: No L ABI non compressible Electronic Signature(s) Signed: 08/28/2019 9:21:08 AM By: Sandre Kitty Entered By: Sandre Kitty on 08/06/2019 10:56:46 -------------------------------------------------------------------------------- Encounter Discharge Information Details Patient Name: Date of Service: Tonya Hunter 08/06/2019 10:45 AM Medical Record [id number] Patient Account Number: [id number] Date of Birth/Sex: Treating RN: Jun 25, 1934 (85 y.o. Tonya Hunter Primary Care : [name] Other Clinician: Referring : Treating /Extender:[name], [name], [name] in Treatment: 10 Encounter  Discharge Information Items Post Procedure Vitals Discharge Condition: Stable Temperature (F): 98.8 Ambulatory Status: Walker Pulse (bpm): 70 Discharge Destination: Home Respiratory Rate (breaths/min): 18 Transportation: Private Auto Blood Pressure (mmHg): 124/56 Accompanied By: self Schedule Follow-up Appointment: Yes Clinical Summary of Care: Patient Declined Electronic Signature(s) Signed: 08/06/2019 5:48:19 PM By: Kela Millin Entered By: Kela Millin on 08/06/2019 11:59:19 -------------------------------------------------------------------------------- Lower Extremity Assessment Details Patient Name: Date of Service: Tonya Hunter 08/06/2019 10:45 AM Medical Record [id number] Patient Account Number: [id number] Date of Birth/Sex: Treating RN: Jul 21, 1934 (85 y.o. Tonya Hunter Primary Care : [name] Other Clinician: Referring : Treating /Extender:[name], [name], [name] Weeks in Treatment: 10 Edema Assessment Assessed: [Left: No] [Right: No] Edema: [Left: Ye] [Right: s] Calf Left: Right: Point of Measurement: 31 cm From Medial Instep cm 35.8 cm Ankle Left: Right: Point of Measurement: 9 cm From Medial Instep cm 26 cm Vascular Assessment Pulses: Dorsalis Pedis Palpable: [Right:Yes] Electronic Signature(s) Signed: 08/28/2019 9:02:47 AM By: Levan Hurst RN, BSN Entered By: Levan Hurst on 08/06/2019 11:16:01 -------------------------------------------------------------------------------- Multi Wound Chart Details Patient Name: Date of Service: Tonya Hunter 08/06/2019 10:45 AM Medical Record [id number] Patient Account Number: [id number] Date of Birth/Sex: Treating RN: February 26, 1935 (85 y.o. 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, Other Lower Extremity] [5:Diabetic Wound/Ulcer of the Lower Extremity] Comorbid History: [2:Cataracts, Anemia, Angina, Cataracts, Anemia, Angina, N/A Congestive Heart Failure, Congestive Heart Failure, Coronary Artery Disease, Coronary Artery Disease, Hypertension, Myocardial Hypertension, Myocardial Infarction, Type II  Diabetes, Infarction, Type II Diabetes, Osteoarthritis, Neuropathy Osteoarthritis, Neuropathy] Date Acquired: [2:05/22/2019] [4:07/22/2019] [5:07/28/2019] Weeks of Treatment: [2:10] [  4:2] [5:1] Wound Status: [2:Open] [4:Healed - Epithelialized] [5:Open] Measurements L x W x D 1x1.1x0.4 [4:0x0x0] [5:0.3x0.3x0.4] (cm) Area (cm) : [2:0.864] [4:0] [5:0.071] Volume (cm) : [2:0.346] [4:0] [5:0.028] % Reduction in Area: [2:88.00%] [4:100.00%] [5:69.90%] % Reduction in Volume: 52.10% [4:100.00%] [5:-16.70%] Classification: [2:Grade 2] [4:Full Thickness Without Exposed Support Structures] [5:Grade 2] Exudate Amount: [2:Medium] [4:None Present] [5:N/A] Exudate Type: [2:Serosanguineous] [4:N/A] [5:N/A] Exudate Color: [2:red, brown] [4:N/A] [5:N/A] Wound Margin: [2:Epibole] [4:Flat and Intact] [5:N/A] Granulation Amount: [2:Large (67-100%)] [4:None Present (0%)] [5:N/A] Granulation Quality: [2:Pink] [4:N/A] [5:N/A] Necrotic Amount: [2:Small (1-33%)] [4:None Present (0%)] [5:N/A] Exposed Structures: [2:Fat Layer (Subcutaneous Fascia: No Tissue) Exposed: Yes Fascia: No Tendon: No Muscle: No Joint: No Bone: No] [4:Fat Layer (Subcutaneous Tissue) Exposed: No  Tendon: No Muscle: No Joint: No Bone: No] [5:N/A] Epithelialization: [2:Small (1-33%)] [4:Large (67-100%)] [5:N/A] Debridement: [2:Debridement - Excisional N/A] [5:N/A] Pre-procedure [2:11:33] [4:N/A] [5:N/A] Verification/Time Out Taken: Pain Control: [2:Lidocaine 5% topical ointment] [4:N/A] [5:N/A] Tissue Debrided: [2:Subcutaneous, Slough] [4:N/A] [5:N/A] Level: [2:Skin/Subcutaneous Tissue N/A] [5:N/A] Debridement Area (sq cm):1.1 [4:N/A] [5:N/A] Instrument: [2:Curette] [4:N/A] [5:N/A] Bleeding: [2:Moderate] [4:N/A] [5:N/A] Hemostasis Achieved: [2:Silver Nitrate] [4:N/A] [5:N/A] Procedural Pain: [2:0] [4:N/A] [5:N/A] Post Procedural Pain: [2:0] [4:N/A] [5:N/A] Debridement Treatment Procedure was tolerated [4:N/A] [5:N/A] Response: [2:well] Post Debridement [2:1x1.1x0.4] [4:N/A] [5:N/A] Measurements L x W x D (cm) Post Debridement [2:0.346] [4:N/A] [5:N/A] Volume: (cm) Procedures Performed: Debridement [4:N/A 6 N/A] [5:N/A N/A] Photos: [2:No Photos] [4:N/A] [5:N/A] Wound Location: [2:Right Lower Leg - Anterior, Distal] [4:N/A] [5:N/A] Wounding Event: [2:Trauma] [4:N/A] [5:N/A] Primary Etiology: [2:Skin Tear] [4:N/A] [5:N/A] Comorbid History: [2:Cataracts, Anemia, Angina, Congestive Heart Failure, Coronary Artery Disease, Hypertension, Myocardial Infarction, Type II Diabetes, Osteoarthritis, Neuropathy] [4:N/A] [5:N/A] Date Acquired: [2:07/31/2019] [4:N/A] [5:N/A] Weeks of Treatment: [2:0] [4:N/A] [5:N/A] Wound Status: [2:Open] [4:N/A] [5:N/A] Measurements L x W x D 0.9x0.4x0.1 [4:N/A] [5:N/A] (cm) Area (cm) : [2:0.283] [4:N/A] [5:N/A] Volume (cm) : [2:0.028] [4:N/A] [5:N/A] % Reduction in Area: [2:N/A] [4:N/A] [5:N/A] % Reduction in Volume: N/A [4:N/A] [5:N/A] Classification: [2:Partial Thickness] [4:N/A] [5:N/A] Exudate Amount: [2:Small] [4:N/A] [5:N/A] Exudate Type: [2:Serosanguineous] [4:N/A] [5:N/A] Exudate Color: [2:red, brown] [4:N/A] [5:N/A] Wound Margin: [2:Flat  and Intact] [4:N/A] [5:N/A] Granulation Amount: [2:Large (67-100%)] [4:N/A] [5:N/A] Granulation Quality: [2:Pink] [4:N/A] [5:N/A] Necrotic Amount: [2:None Present (0%)] [4:N/A] [5:N/A] Exposed Structures: [2:Fascia: No Fat Layer (Subcutaneous Tissue) Exposed: No Tendon: No Muscle: No Joint: No Bone: No] [4:N/A] [5:N/A] Epithelialization: [2:None] [4:N/A] [5:N/A] Debridement: [2:N/A] [4:N/A] [5:N/A] Pain Control: [2:N/A] [4:N/A] [5:N/A] Tissue Debrided: [2:N/A] [4:N/A] [5:N/A] Level: [2:N/A] [4:N/A] [5:N/A] Debridement Area (sq cm):N/A [4:N/A] [5:N/A] Instrument: [2:N/A] [4:N/A] [5:N/A] Bleeding: [2:N/A] [4:N/A] [5:N/A] Hemostasis Achieved: [2:N/A] [4:N/A] [5:N/A] Procedural Pain: [2:N/A] [4:N/A] [5:N/A] Post Procedural Pain: [2:N/A] [4:N/A] [5:N/A] Debridement Treatment [2:N/A] [4:N/A] [5:N/A] Response: Post Debridement [2:N/A] [4:N/A] [5:N/A] Measurements L x W x D (cm) Post Debridement [2:N/A] [4:N/A] [5:N/A] Volume: (cm) Procedures Performed: [2:N/A] [4:N/A] [5:N/A] Treatment Notes Wound #2 (Right, Medial Toe Great) 1. Cleanse With Wound Cleanser 3. Primary Dressing Applied Col[employer] 4. Secondary Dressing Dry Gauze Roll Gauze 5. Secured With Tape 7. Footwear/Offloading device applied Felt/Foam Notes felt in shoe Wound #6 (Right, Distal, Anterior Lower Leg) 1. Cleanse With Wound Cleanser 3. Primary Dressing Applied Col[employer] 4. Secondary Dressing Dry Gauze Roll Gauze 5. Secured With Tape 7. Footwear/Offloading device applied Felt/Foam Notes felt in Designer, jewellery Signature(s) Signed: 08/06/2019 5:53:09 PM By: Linton Ham MD Signed: 08/06/2019 5:59:23 PM By: [name] RN Entered By: Linton Ham on 08/06/2019 12:40:46 -------------------------------------------------------------------------------- Fifty-Six Details Patient Name: Date  of Service: AMERIAH, LINT 08/06/2019 10:45 AM Medical Record [id number] Patient  Account Number: [id number] Date of Birth/Sex: Treating RN: [birth year] (85 y.o. Tonya Hunter Primary Care Frankie Scipio: [name] Other Clinician: Referring Elyana Grabski: Treating Alazar Cherian/Extender:[name], [name], [name] in Treatment: 10 Active Inactive Wound/Skin Impairment Nursing Diagnoses: Impaired tissue integrity Knowledge deficit related to ulceration/compromised skin integrity Goals: Patient/caregiver will verbalize understanding of skin care regimen Date Initiated: 05/22/2019 Target Resolution Date: 08/16/2019 Goal Status: Active Ulcer/skin breakdown will have a volume reduction of 30% by week 4 Date Initiated: 05/22/2019 Date Inactivated: 06/19/2019 Target Resolution Date: 06/19/2019 Goal Status: Met Ulcer/skin breakdown will have a volume reduction of 50% by week 8 Date Initiated: 06/19/2019 Date Inactivated: 07/16/2019 Target Resolution Date: 07/17/2019 Goal Status: Met Ulcer/skin breakdown will have a volume reduction of 80% by week 12 Date Initiated: 07/30/2019 Target Resolution Date: 08/16/2019 Goal Status: Active Interventions: Assess patient/caregiver ability to obtain necessary supplies Assess patient/caregiver ability to perform ulcer/skin care regimen upon admission and as needed Assess ulceration(s) every visit Provide education on ulcer and skin care Treatment Activities: Skin care regimen initiated : 05/22/2019 Topical wound management initiated : 05/22/2019 Notes: Electronic Signature(s) Signed: 08/06/2019 5:59:23 PM By: [name] RN Entered By: [name] on 08/06/2019 11:29:33 -------------------------------------------------------------------------------- Pain Assessment Details Patient Name: Date of Service: ANICKA, STUCKERT 08/06/2019 10:45 AM Medical Record [id number] Patient Account Number: [id number] Date of Birth/Sex: Treating RN: 06-09-1934 (85 y.o. Tonya Hunter Primary Care Ellanora Rayborn: [name] Other  Clinician: Referring Prezley Qadir: Treating Tajay Muzzy/Extender:[name], [name], [name] in Treatment: 10 Active Problems Location of Pain Severity and Description of Pain Patient Has Paino No Site Locations Pain Management and Medication Current Pain Management: Electronic Signature(s) Signed: 08/06/2019 5:59:23 PM By: [name] RN Signed: 08/28/2019 9:21:08 AM By: Sandre Kitty Entered By: Sandre Kitty on 08/06/2019 10:59:13 -------------------------------------------------------------------------------- Patient/Caregiver Education Details Patient Name: Date of Service: Tonya Hunter 3/23/2021andnbsp10:45 AM Medical Record [id number] Patient Account Number: [id number] Date of Birth/Gender: Treating RN: October 27, 1934 (85 y.o. Tonya Hunter Primary Care Physician: [name] Other Clinician: Referring Physician: Treating Physician/Extender:[name], [name], [name] in Treatment: 10 Education Assessment Education Provided To: Patient Education Topics Provided Wound/Skin Impairment: Methods: Explain/Verbal Responses: State content correctly Electronic Signature(s) Signed: 08/06/2019 5:59:23 PM By: [name] RN Entered By: [name] on 08/06/2019 11:29:49 -------------------------------------------------------------------------------- Wound Assessment Details Patient Name: Date of Service: ANDRIENNE, HAVENER 08/06/2019 10:45 AM Medical Record [id number] Patient Account Number: [id number] Date of Birth/Sex: Treating RN: 01/25/1935 (85 y.o. Tonya Hunter Primary Care Syndey Jaskolski: [name] Other Clinician: Referring Shadman Tozzi: Treating Isabelle Matt/Extender:[name], [name], [name] Weeks in Treatment: 10 Wound Status Wound Number: 2 Primary Diabetic Wound/Ulcer of the Lower Extremity Etiology: Wound Location: Right Toe Great - Medial Wound Open Wounding Event: Blister Status: Date Acquired:  05/22/2019 Comorbid Cataracts, Anemia, Angina, Congestive Heart Weeks Of Treatment: 10 History: Failure, Coronary Artery Disease, Clustered Wound: No Hypertension, Myocardial Infarction, Type II Diabetes, Osteoarthritis, Neuropathy Photos Photo Uploaded By: Mikeal Hawthorne on 08/08/2019 14:06:55 Wound Measurements Length: (cm) 1 Width: (cm) 1.1 Depth: (cm) 0.4 Area: (cm) 0.864 Volume: (cm) 0.346 Wound Description Classification: Grade 2 Wound Margin: Epibole Exudate Amount: Medium Exudate Type: Serosanguineous Exudate Color: red, brown Wound Bed Granulation Amount: Large (67-100%) Granulation Quality: Pink Necrotic Amount: Small (1-33%) Necrotic Quality: Adherent Slough After Cleansing: No brino Yes Exposed Structure osed: No (Subcutaneous Tissue) Exposed: Yes osed: No Exposed: No xposed: No posed: No % Reduction in Area:  88% % Reduction in Volume: 52.1% Epithelialization: Small (1-33%) Tunneling: No Undermining: No Foul Odor Slough/Fi Fascia Exp Fat Layer Tendon Exp Muscle Joint E Bone Ex Electronic Signature(s) Signed: 08/06/2019 5:59:23 PM By: [name] RN Signed: 08/28/2019 9:02:47 AM By: Levan Hurst RN, BSN Entered By: Levan Hurst on 08/06/2019 11:15:08 -------------------------------------------------------------------------------- Wound Assessment Details Patient Name: Date of Service: Tonya Hunter 08/06/2019 10:45 AM Medical Record [id number] Patient Account Number: [id number] Date of Birth/Sex: Treating RN: [birth year] (85 y.o. Tonya Hunter Primary Care Versa Craton: [name] Other Clinician: Referring Malakye Nolden: Treating Jazzy Parmer/Extender:[name], [name], [name] Weeks in Treatment: 10 Wound Status Wound Number: 4 Primary Trauma, Other Etiology: Wound Location: Right Lower Leg - Medial Wound Healed - Epithelialized Wounding Event: Trauma Status: Date Acquired: 07/22/2019 Comorbid Cataracts, Anemia, Angina,  Congestive Heart Weeks Of Treatment: 2 History: Failure, Coronary Artery Disease, Clustered Wound: No Hypertension, Myocardial Infarction, Type II Diabetes, Osteoarthritis, Neuropathy Photos Photo Uploaded By: Mikeal Hawthorne on 08/08/2019 14:06:56 Wound Measurements Length: (cm) 0 % Reduct Width: (cm) 0 % Reduct Depth: (cm) 0 Epitheli Area: (cm) 0 Tunneli Volume: (cm) 0 Undermi Wound Description Classification: Full Thickness Without Exposed Support Foul Od Structures Slough/ Wound Flat and Intact Margin: Exudate Exudate None Present Amount: Wound Bed Granulation Amount: None Present (0%) Necrotic Amount: None Present (0%) Fascia Exp Fat Layer Tendon Exp Muscle Exp Joint Expo Bone Expos or After Cleansing: No Fibrino No Exposed Structure osed: No (Subcutaneous Tissue) Exposed: No osed: No osed: No sed: No ed: No ion in Area: 100% ion in Volume: 100% alization: Large (67-100%) ng: No ning: No Electronic Signature(s) Signed: 08/06/2019 5:59:23 PM By: [name] RN Signed: 08/28/2019 9:02:47 AM By: Levan Hurst RN, BSN Entered By: Levan Hurst on 08/06/2019 11:15:26 -------------------------------------------------------------------------------- Wound Assessment Details Patient Name: Date of Service: Tonya Hunter 08/06/2019 10:45 AM Medical Record [id number] Patient Account Number: [id number] Date of Birth/Sex: Treating RN: December 03, 1934 (85 y.o. Tonya Hunter Primary Care Chandlar Staebell: [name] Other Clinician: Referring Read Bonelli: Treating Ninah Moccio/Extender:[name], [name], [name] Weeks in Treatment: 10 Wound Status Wound Number: 5 Primary Diabetic Wound/Ulcer of the Lower Etiology: Extremity Wound Location: Right, Medial Toe Great Wound Status: Open Wounding Event: Gradually Appeared Date Acquired: 07/28/2019 Weeks Of Treatment: 1 Clustered Wound: No Wound Measurements Length: (cm) 0.3 Width: (cm) 0.3 Depth: (cm)  0.4 Area: (cm) 0.071 Volume: (cm) 0.028 Wound Description Classification: Grade 2 % Reduction in Area: 69.9% % Reduction in Volume: -16.7% Electronic Signature(s) Signed: 08/06/2019 5:59:23 PM By: [name] RN Signed: 08/28/2019 9:21:08 AM By: Sandre Kitty Entered By: Sandre Kitty on 08/06/2019 11:06:40 -------------------------------------------------------------------------------- Wound Assessment Details Patient Name: Date of Service: Tonya Hunter 08/06/2019 10:45 AM Medical Record [id number] Patient Account Number: [id number] Date of Birth/Sex: Treating RN: January 24, 1935 (85 y.o. Tonya Hunter Primary Care Neleh Muldoon: [name] Other Clinician: Referring Alois Mincer: Treating Rosanne Wohlfarth/Extender:[name], [name], [name] Weeks in Treatment: 10 Wound Status Wound Number: 6 Primary Skin Tear Etiology: Wound Location: Right Lower Leg - Anterior, Distal Wound Open Wounding Event: Trauma Status: Date Acquired: 07/31/2019 Comorbid Cataracts, Anemia, Angina, Congestive Heart Weeks Of Treatment: 0 History: Failure, Coronary Artery Disease, Clustered Wound: No Hypertension, Myocardial Infarction, Type II Diabetes, Osteoarthritis, Neuropathy Photos Photo Uploaded By: Mikeal Hawthorne on 08/08/2019 14:06:11 Wound Measurements Length: (cm) 0.9 % Reduct Width: (cm) 0.4 % Reduct Depth: (cm) 0.1 Epitheli Area: (cm) 0.283 Tunneli Volume: (cm) 0.028 Undermi Wound Description Classification: Partial Thickness Foul Od Wound Margin: Flat and Intact Slough/ Exudate  Amount: Small Exudate Type: Serosanguineous Exudate Color: red, brown Wound Bed Granulation Amount: Large (67-100%) Granulation Quality: Pink Fascia Necrotic Amount: None Present (0%) Fat Lay Tendon Muscle Joint E Bone Ex Electronic Signature(s) Signed: 08/06/2019 5:59:23 PM By: [name] RN Signed: 08/28/2019 9:21:08 AM By: Sandre Kitty Entered By: Sandre Kitty on  03/23 or After Cleansing: No Fibrino No Exposed Structure Exposed: No er (Subcutaneous Tissue) Exposed: No Exposed: No Exposed: No xposed: No posed: No /2021 11:13:54 ion in Area: ion in Volume: alization: None ion in Volume: ng: No ion in Volume: ning: No -------------------------------------------------------------------------------- Vitals Details Patient Name: Date of Service: ALYZAE, HAWKEY 08/06/2019 10:45 AM Medical Record [id number] Patient Account Number: [id number] Date of Birth/Sex: Treating RN: 12-Mar-1935 (85 y.o. Tonya Hunter Primary Care Leanord Thibeau: [name] Other Clinician: Referring Berkeley Veldman: Treating Egon Dittus/Extender:[name], [name], [name] in Treatment: 10 Vital Signs Time Taken: 10:56 Temperature (F): 98.8 Height (in): 66 Pulse (bpm): 70 Weight (lbs): 164 Respiratory Rate (breaths/min): 18 Body Mass Index (BMI): 26.5 Blood Pressure (mmHg): 124/56 Reference Range: 80 - 120 mg / dl Electronic Signature(s) Signed: 08/28/2019 9:21:08 AM By: Sandre Kitty Entered By: Sandre Kitty on 08/06/2019 10:59:00

## 2019-08-28 NOTE — Progress Notes (Signed)
KAGAN, HIETPAS ([id number]) Visit Report for 07/03/2019 Arrival Information Details Patient Name: Date of Service: Tonya Hunter, Tonya Hunter 07/03/2019 11:00 AM Medical Record [id number] Patient Account Number: [id number] Date of Birth/Sex: Treating RN: 12-Jul-1934 (85 y.o. Tonya Hunter Primary Care Komal Stangelo: [name] Other Clinician: Referring Kaleb Sek: Treating Keishawn Darsey/Extender:Stone III, [name], [name] Weeks in Treatment: 6 Visit Information History Since Last Visit Added or deleted any medications: No Patient Arrived: [name] Any new allergies or adverse reactions: No Arrival Time: 11:46 Had a fall or experienced change in No Accompanied By: self activities of daily living that may affect Transfer Assistance: None risk of falls: Patient Identification Verified: Yes Signs or symptoms of abuse/neglect since last No Secondary Verification Process Yes visito Completed: Hospitalized since last visit: No Patient Requires Transmission- No Implantable device outside of the clinic excluding No Based Precautions: cellular tissue based products placed in the center Patient Has Alerts: Yes since last visit: Patient Alerts: Patient on Blood Has Dressing in Place as Prescribed: Yes Thinner Pain Present Now: Yes L ABI non compressible Electronic Signature(s) Signed: 08/28/2019 9:23:26 AM By: Sandre Kitty Entered By: Sandre Kitty on 07/03/2019 11:47:52 -------------------------------------------------------------------------------- Encounter Discharge Information Details Patient Name: Date of Service: Tonya Hunter. 07/03/2019 11:00 AM Medical Record [id number] Patient Account Number: [id number] Date of Birth/Sex: Treating RN: Nov 24, 1934 (85 y.o. Tonya Hunter, Tammi Klippel Primary Care Axcel Horsch: [name] Other Clinician: Referring Karista Aispuro: Treating Latania Bascomb/Extender:Stone III, [name], [name] Weeks in Treatment: 6 Encounter  Discharge Information Items Post Procedure Vitals Discharge Condition: Stable Temperature (F): 97.7 Ambulatory Status: Walker Pulse (bpm): 67 Discharge Destination: Home Respiratory Rate (breaths/min): 19 Telephoned: Yes Blood Pressure (mmHg): 113/45 Orders Sent: Yes Transportation: Private Auto Accompanied By: self Schedule Follow-up Appointment: Yes Clinical Summary of Care: Electronic Signature(s) Signed: 07/03/2019 5:44:15 PM By: Deon Pilling Entered By: Deon Pilling on 07/03/2019 13:00:01 -------------------------------------------------------------------------------- Lower Extremity Assessment Details Patient Name: Date of Service: Tonya Hunter 07/03/2019 11:00 AM Medical Record [id number] Patient Account Number: [id number] Date of Birth/Sex: Treating RN: 1934/10/30 (85 y.o. Tonya Hunter Primary Care Jammie Troup: [name] Other Clinician: Referring Chasen Mendell: Treating Jimmie Dattilio/Extender:Stone III, [name], [name] Weeks in Treatment: 6 Edema Assessment Assessed: [Left: No] [Right: No] Edema: [Left: No] [Right: Yes] Calf Left: Right: Point of Measurement: 31 cm From Medial Instep cm 35.3 cm Ankle Left: Right: Point of Measurement: 9 cm From Medial Instep cm 25.7 cm Vascular Assessment Pulses: Dorsalis Pedis Palpable: [Right:Yes] Electronic Signature(s) Signed: 07/11/2019 8:56:47 AM By: Levan Hurst RN, BSN Entered By: Levan Hurst on 07/03/2019 11:56:06 -------------------------------------------------------------------------------- [place] Details Patient Name: Date of Service: Tonya Hunter. 07/03/2019 11:00 AM Medical Record [id number] Patient Account Number: [id number] Date of Birth/Sex: Treating RN: [birth year] (85 y.o. Tonya Hunter Primary Care Tenasia Aull: [name] Other Clinician: Referring Kairy Folsom: Treating Perez Dirico/Extender:Stone III, [name], [name] Weeks in Treatment:  6 Active Inactive Abuse / Safety / Falls / Self Care Management Nursing Diagnoses: Potential for falls Goals: Patient/caregiver will verbalize/demonstrate measures taken to prevent injury and/or falls Date Initiated: 05/22/2019 Target Resolution Date: 07/17/2019 Goal Status: Active Interventions: Assess fall risk on admission and as needed Assess impairment of mobility on admission and as needed per policy Assess personal safety and home safety (as indicated) on admission and as needed Notes: Nutrition Nursing Diagnoses: Impaired glucose control: actual or potential Goals: Patient/caregiver will maintain therapeutic glucose control Date Initiated: 05/22/2019 Target Resolution Date: 07/17/2019 Goal Status: Active Interventions: Assess HgA1c results as ordered upon  admission and as needed Assess patient nutrition upon admission and as needed per policy Provide education on elevated blood sugars and impact on wound healing Treatment Activities: Patient referred to Primary Care Physician for further nutritional evaluation : 05/22/2019 Notes: Wound/Skin Impairment Nursing Diagnoses: Impaired tissue integrity Knowledge deficit related to ulceration/compromised skin integrity Goals: Patient/caregiver will verbalize understanding of skin care regimen Date Initiated: 05/22/2019 Target Resolution Date: 07/17/2019 Goal Status: Active Ulcer/skin breakdown will have a volume reduction of 30% by week 4 Date Initiated: 05/22/2019 Date Inactivated: 06/19/2019 Target Resolution Date: 06/19/2019 Goal Status: Met Ulcer/skin breakdown will have a volume reduction of 50% by week 8 Date Initiated: 06/19/2019 Target Resolution Date: 07/17/2019 Goal Status: Active Interventions: Assess patient/caregiver ability to obtain necessary supplies Assess patient/caregiver ability to perform ulcer/skin care regimen upon admission and as needed Assess ulceration(s) every visit Provide education on ulcer and skin  care Treatment Activities: Skin care regimen initiated : 05/22/2019 Topical wound management initiated : 05/22/2019 Notes: Electronic Signature(s) Signed: 07/05/2019 6:14:01 PM By: [name] RN, BSN Entered By: [name] on 07/03/2019 12:35:34 -------------------------------------------------------------------------------- Pain Assessment Details Patient Name: Date of Service: Tonya Hunter 07/03/2019 11:00 AM Medical Record [id number] Patient Account Number: [id number] Date of Birth/Sex: Treating RN: 02/12/1935 (85 y.o. Tonya Hunter Primary Care Krish Bailly: [name] Other Clinician: Referring Patric Vanpelt: Treating Rubie Ficco/Extender:Stone III, [name], [name] Weeks in Treatment: 6 Active Problems Location of Pain Severity and Description of Pain Patient Has Paino Yes Site Locations Rate the pain. Current Pain Level: 5 Pain Management and Medication Current Pain Management: Electronic Signature(s) Signed: 07/05/2019 6:14:01 PM By: [name] RN, BSN Signed: 08/28/2019 9:23:26 AM By: Sandre Kitty Entered By: Sandre Kitty on 07/03/2019 11:48:27 -------------------------------------------------------------------------------- Patient/Caregiver Education Details Patient Name: Date of Service: Tonya Hunter, Tonya S. 2/17/2021andnbsp11:00 AM Medical Record Patient Account Number: [id number] Number: Treating RN: [name] Date of Birth/Gender: 09/26/1934 (85 y.o. Other Clinician: F) Treating Worthy Keeler Primary Care Physician: [name] Physician/Extender: Referring Physician: Denyse Dago in Treatment: 6 Education Assessment Education Provided To: Patient Education Topics Provided Elevated Blood Sugar/ Impact on Healing: Methods: Explain/Verbal Responses: Reinforcements needed, State content correctly Infection: Methods: Explain/Verbal Responses: Reinforcements needed, State content  correctly Wound/Skin Impairment: Methods: Explain/Verbal Responses: Reinforcements needed, State content correctly Electronic Signature(s) Signed: 07/05/2019 6:14:01 PM By: [name] RN, BSN Entered By: [name] on 07/03/2019 12:36:28 -------------------------------------------------------------------------------- Wound Assessment Details Patient Name: Date of Service: Tonya Hunter 07/03/2019 11:00 AM Medical Record [id number] Patient Account Number: [id number] Date of Birth/Sex: Treating RN: Aug 29, 1934 (84 y.o. Tonya Hunter Primary Care Brevyn Ring: [name] Other Clinician: Referring Raychell Holcomb: Treating Maisen Schmit/Extender:Stone III, [name], [name] Weeks in Treatment: 6 Wound Status Wound Number: 2 Primary Diabetic Wound/Ulcer of the Lower Extremity Etiology: Wound Location: Right Toe Great - Medial Wound Open Wounding Event: Blister Status: Date Acquired: 05/22/2019 Comorbid Cataracts, Anemia, Angina, Congestive Heart Weeks Of Treatment: 6 History: Failure, Coronary Artery Disease, Clustered Wound: No Hypertension, Myocardial Infarction, Type II Diabetes, Osteoarthritis, Neuropathy Photos Wound Measurements Length: (cm) 0.9 Width: (cm) 0.7 Depth: (cm) 0.3 Area: (cm) 0.495 Volume: (cm) 0.148 % Reduction in Area: 93.1% % Reduction in Volume: 79.5% Epithelialization: None Tunneling: No Undermining: Yes Starting Position (o'clock): 12 Ending Position (o'clock): 12 Maximum Distance: (cm) 0.3 Wound Description Classification: Grade 2 Foul Odor Wound Margin: Well defined, not attached Slough/Fib Exudate Amount: Medium Exudate Type: Serosanguineous Exudate Color: red, brown Wound Bed Granulation Amount: Medium (34-66%) Granulation Quality: Pink Fascia  Expo Necrotic Amount: Medium (34-66%) Fat Layer ( Necrotic Quality: Adherent Slough Tendon Expo Muscle Expo Joint Expos Bone Expose After Cleansing: No rino Yes Exposed  Structure sed: No Subcutaneous Tissue) Exposed: Yes sed: No sed: No ed: No d: No Electronic Signature(s) Signed: 07/05/2019 5:06:31 PM By: Mikeal Hawthorne EMT/HBOT Signed: 07/05/2019 6:14:01 PM By: [name] RN, BSN Entered By: Mikeal Hawthorne on 07/05/2019 10:55:35 -------------------------------------------------------------------------------- [place] Details Patient Name: Date of Service: Tonya Hunter. 07/03/2019 11:00 AM Medical Record [id number] Patient Account Number: [id number] Date of Birth/Sex: Treating RN: 04-27-1935 (85 y.o. Tonya Hunter Primary Care Zacheriah Stumpe: [name] Other Clinician: Referring Dorthie Santini: Treating Venera Privott/Extender:Stone III, [name], [name] Weeks in Treatment: 6 Vital Signs Time Taken: 11:47 Temperature (F): 97.7 Height (in): 66 Pulse (bpm): 67 Weight (lbs): 164 Respiratory Rate (breaths/min): 19 Body Mass Index (BMI): 26.5 Blood Pressure (mmHg): 113/45 Capillary Blood Glucose (mg/dl): 162 Reference Range: 80 - 120 mg / dl Electronic Signature(s) Signed: 08/28/2019 9:23:26 AM By: Sandre Kitty Entered By: Sandre Kitty on 07/03/2019 11:48:20

## 2019-08-28 NOTE — Progress Notes (Signed)
Tonya Hunter, Tonya Hunter ([id number]) Visit Report for 06/26/2019 Arrival Information Details Patient Name: Date of Service: Tonya Hunter, Tonya Hunter 06/26/2019 11:15 AM Medical Record [id number] Patient Account Number: [id number] Date of Birth/Sex: Treating RN: 1934-07-23 (85 y.o. [name] Primary Care Vincente Asbridge: [name] Other Clinician: Referring Letecia Arps: Treating Perfecto Purdy/Extender:Stone III, [name], [name] Weeks in Treatment: 5 Visit Information History Since Last Visit Added or deleted any medications: No Patient Arrived: [name] Any new allergies or adverse reactions: No Arrival Time: 11:14 Had a fall or experienced change in No Accompanied By: self activities of daily living that may affect Transfer Assistance: None risk of falls: Patient Identification Verified: Yes Signs or symptoms of abuse/neglect since last No Secondary Verification Process Yes visito Completed: Hospitalized since last visit: No Patient Requires Transmission- No Implantable device outside of the clinic excluding No Based Precautions: cellular tissue based products placed in the center Patient Has Alerts: Yes since last visit: Patient Alerts: Patient on Blood Has Dressing in Place as Prescribed: Yes Thinner Pain Present Now: No L ABI non compressible Electronic Signature(s) Signed: 08/28/2019 9:23:26 AM By: Sandre Kitty Entered By: Sandre Kitty on 06/26/2019 11:14:41 -------------------------------------------------------------------------------- Compression Therapy Details Patient Name: Date of Service: Tonya Hunter 06/26/2019 11:15 AM Medical Record [id number] Patient Account Number: [id number] Date of Birth/Sex: Treating RN: 1934/07/26 (85 y.o. [name] Primary Care Suri Tafolla: [name] Other Clinician: Referring Jedrick Hutcherson: Treating Lagretta Loseke/Extender:Stone III, [name], [name] Weeks in Treatment: 5 Compression Therapy  Performed for Wound Wound #3 Right,Anterior Lower Leg Assessment: Performed By: Jake Church, RN Compression Type: Three Layer Post Procedure Diagnosis Same as Pre-procedure Electronic Signature(s) Signed: 06/26/2019 5:39:41 PM By: [name] RN, BSN Entered By: [name] on 06/26/2019 12:16:05 -------------------------------------------------------------------------------- Encounter Discharge Information Details Patient Name: Date of Service: Tonya Hunter 06/26/2019 11:15 AM Medical Record [id number] Patient Account Number: [id number] Date of Birth/Sex: Treating RN: 1935/04/09 (84 y.o. [name] Primary Care Elaura Calix: [name] Other Clinician: Referring Amariah Kierstead: Treating Carlyn Mullenbach/Extender:Stone III, [name], [name] Weeks in Treatment: 5 Encounter Discharge Information Items Post Procedure Vitals Discharge Condition: Stable Temperature (F): 97.9 Ambulatory Status: Ambulatory Pulse (bpm): 72 Discharge Destination: Home Respiratory Rate (breaths/min): 20 Transportation: Private Auto Blood Pressure (mmHg): 126/55 Accompanied By: self Schedule Follow-up Appointment: Yes Clinical Summary of Care: Patient Declined Electronic Signature(s) Signed: 06/26/2019 4:53:37 PM By: [name] RN Entered By: [name] on 06/26/2019 12:31:10 -------------------------------------------------------------------------------- Lower Extremity Assessment Details Patient Name: Date of Service: Tonya Hunter, Tonya Hunter 06/26/2019 11:15 AM Medical Record [id number] Patient Account Number: [id number] Date of Birth/Sex: Treating RN: Sep 22, 1934 (85 y.o. [name] Primary Care Yunus Stoklosa: [name] Other Clinician: Referring Miki Labuda: Treating Kaitlyn Franko/Extender:Stone III, [name], [name] Weeks in Treatment: 5 Edema Assessment Assessed: [Left: No] [Right: No] Edema: [Left: Yes] [Right: Yes] Calf Left: Right: Point of  Measurement: 31 cm From Medial Instep cm 35 cm Ankle Left: Right: Point of Measurement: 9 cm From Medial Instep cm 23 cm Electronic Signature(s) Signed: 06/26/2019 4:53:37 PM By: [name] RN Entered By: [name] on 06/26/2019 11:33:27 -------------------------------------------------------------------------------- Multi-Disciplinary Care Plan Details Patient Name: Date of Service: Tonya Hunter 06/26/2019 11:15 AM Medical Record [id number] Patient Account Number: [id number] Date of Birth/Sex: Treating RN: 1934-11-16 (86 y.o. [name] Primary Care Gara Kincade: [name] Other Clinician: Referring Zooey Schreurs: Treating Welcome Fults/Extender:Stone III, [name], [name] Weeks in Treatment: 5 Active Inactive Abuse / Safety / Falls / Self Care Management Nursing Diagnoses: Potential for  falls Goals: Patient/caregiver will verbalize/demonstrate measures taken to prevent injury and/or falls Date Initiated: 05/22/2019 Target Resolution Date: 07/17/2019 Goal Status: Active Interventions: Assess fall risk on admission and as needed Assess impairment of mobility on admission and as needed per policy Assess personal safety and home safety (as indicated) on admission and as needed Notes: Nutrition Nursing Diagnoses: Impaired glucose control: actual or potential Goals: Patient/caregiver will maintain therapeutic glucose control Date Initiated: 05/22/2019 Target Resolution Date: 07/17/2019 Goal Status: Active Interventions: Assess HgA1c results as ordered upon admission and as needed Assess patient nutrition upon admission and as needed per policy Provide education on elevated blood sugars and impact on wound healing Treatment Activities: Patient referred to Primary Care Physician for further nutritional evaluation : 05/22/2019 Notes: Wound/Skin Impairment Nursing Diagnoses: Impaired tissue integrity Knowledge deficit related to ulceration/compromised skin  integrity Goals: Patient/caregiver will verbalize understanding of skin care regimen Date Initiated: 05/22/2019 Target Resolution Date: 07/17/2019 Goal Status: Active Ulcer/skin breakdown will have a volume reduction of 30% by week 4 Date Initiated: 05/22/2019 Date Inactivated: 06/19/2019 Target Resolution Date: 06/19/2019 Goal Status: Met Ulcer/skin breakdown will have a volume reduction of 50% by week 8 Date Initiated: 06/19/2019 Target Resolution Date: 07/17/2019 Goal Status: Active Interventions: Assess patient/caregiver ability to obtain necessary supplies Assess patient/caregiver ability to perform ulcer/skin care regimen upon admission and as needed Assess ulceration(s) every visit Provide education on ulcer and skin care Treatment Activities: Skin care regimen initiated : 05/22/2019 Topical wound management initiated : 05/22/2019 Notes: Electronic Signature(s) Signed: 06/26/2019 5:39:41 PM By: [name] RN, BSN Entered By: [name] on 06/26/2019 12:13:10 -------------------------------------------------------------------------------- Pain Assessment Details Patient Name: Date of Service: Tonya Hunter 06/26/2019 11:15 AM Medical Record [id number] Patient Account Number: [id number] Date of Birth/Sex: Treating RN: [birth year] (85 y.o. [name] Primary Care Natesha Hassey: [name] Other Clinician: Referring Jayme Mednick: Treating Dow Blahnik/Extender:Stone III, [name], [name] Weeks in Treatment: 5 Active Problems Location of Pain Severity and Description of Pain Patient Has Paino No Site Locations Pain Management and Medication Current Pain Management: Electronic Signature(s) Signed: 06/26/2019 5:39:41 PM By: [name] RN, BSN Signed: 08/28/2019 9:23:26 AM By: Sandre Kitty Entered By: Sandre Kitty on 06/26/2019 11:17:24 -------------------------------------------------------------------------------- Patient/Caregiver Education  Details Patient Name: Date of Service: Tonya Hunter 2/10/2021andnbsp11:15 AM Medical Record Patient Account Number: [id number] Number: Treating RN: [name] 22-Dec-1934 (84 y.o. Other Clinician: Date of Birth/Gender: F) Treating Worthy Keeler Primary Care Physician: [name] Physician/Extender: Referring Physician: Denyse Dago in Treatment: 5 Education Assessment Education Provided To: Patient Education Topics Provided Offloading: Methods: Explain/Verbal Responses: Reinforcements needed, State content correctly Wound/Skin Impairment: Methods: Explain/Verbal Responses: Reinforcements needed, State content correctly Electronic Signature(s) Signed: 06/26/2019 5:39:41 PM By: [name] RN, BSN Entered By: [name] on 06/26/2019 12:14:13 -------------------------------------------------------------------------------- Wound Assessment Details Patient Name: Date of Service: Tonya Hunter 06/26/2019 11:15 AM Medical Record [id number] Patient Account Number: [id number] Date of Birth/Sex: Treating RN: 1934/12/17 (84 y.o. Martyn Malay, Linda Primary Care Christophe Rising: [name] Other Clinician: Referring Lakeisa Heninger: Treating Shaquasha Gerstel/Extender:Stone III, [name], [name] Weeks in Treatment: 5 Wound Status Wound Number: 2 Primary Diabetic Wound/Ulcer of the Lower Extremity Etiology: Wound Location: Right Toe Great - Medial Wound Open Wounding Event: Blister Status: Date Acquired: 05/22/2019 Comorbid Cataracts, Anemia, Angina, Congestive Heart Weeks Of Treatment: 5 History: Failure, Coronary Artery Disease, Clustered Wound: No Hypertension, Myocardial Infarction, Type II Diabetes, Osteoarthritis, Neuropathy Photos Wound Measurements Length: (cm) 0.8 Width: (cm) 0.7 Depth: (cm) 0.3  Area: (cm) 0.44 Volume: (cm) 0.132 Wound Description Classification: Grade 2 Wound Margin: Flat and Intact Exudate  Amount: Medium Exudate Type: Serosanguineous Exudate Color: red, brown Wound Bed Granulation Amount: Medium (34-66%) Granulation Quality: Pink Necrotic Amount: Medium (34-66%) Necrotic Quality: Adherent Slough After Cleansing: No rino Yes Exposed Structure osed: No (Subcutaneous Tissue) Exposed: Yes osed: No osed: No xposed: No posed: No % Reduction in Area: 93.9% % Reduction in Volume: 81.7% Epithelialization: Medium (34-66%) Tunneling: No Undermining: No Foul Odor Slough/Fib Fascia Exp Fat Layer Tendon Exp Muscle Exp Joint E Bone Ex Electronic Signature(s) Signed: 06/27/2019 4:30:47 PM By: Mikeal Hawthorne EMT/HBOT Signed: 06/28/2019 5:30:09 PM By: [name] RN, BSN Previous Signature: 06/26/2019 4:53:37 PM Version By: [name] RN Entered By: Mikeal Hawthorne on 06/27/2019 11:19:47 -------------------------------------------------------------------------------- Wound Assessment Details Patient Name: Date of Service: Tonya Hunter 06/26/2019 11:15 AM Medical Record [id number] Patient Account Number: [id number] Date of Birth/Sex: Treating RN: July 02, 1934 (85 y.o. [name] Primary Care Yandel Zeiner: [name] Other Clinician: Referring Wendle Kina: Treating Jearline Hirschhorn/Extender:Stone III, [name], [name] Weeks in Treatment: 5 Wound Status Wound Number: 3 Primary Skin Tear Etiology: Wound Location: Right Lower Leg - Anterior Wound Open Wounding Event: Gradually Appeared Status: Date Acquired: 05/29/2019 Comorbid Cataracts, Anemia, Angina, Congestive Heart Weeks Of Treatment: 3 History: Failure, Coronary Artery Disease, Clustered Wound: No Hypertension, Myocardial Infarction, Type II Diabetes, Osteoarthritis, Neuropathy Photos Wound Measurements Length: (cm) 0.3 % Reduct Width: (cm) 0.3 % Reduct Depth: (cm) 0.1 Epitheli Area: (cm) 0.071 Tunneli Volume: (cm) 0.007 Undermi Wound Description Full Thickness Without Exposed  Support Foul Od Classification: Structures Slough/ Wound Distinct, outline attached Margin: Exudate Medium Medium Amount: Exudate Serosanguineous Type: Exudate red, brown Color: Wound Bed Granulation Amount: Large (67-100%) Granulation Quality: Pink Fascia Exp Fat Layer Tendon Exp Muscle Exp Joint Expo Bone Expos or After Cleansing: No Fibrino No Exposed Structure osed: No (Subcutaneous Tissue) Exposed: Yes osed: No osed: No sed: No ed: No ion in Area: 95.4% ion in Volume: 95.4% alization: Large (67-100%) ng: No ning: No Electronic Signature(s) Signed: 06/27/2019 4:30:47 PM By: Mikeal Hawthorne EMT/HBOT Signed: 06/28/2019 5:30:09 PM By: [name] RN, BSN Previous Signature: 06/26/2019 4:53:37 PM Version By: [name] RN Entered By: Mikeal Hawthorne on 06/27/2019 11:19:26 -------------------------------------------------------------------------------- Vitals Details Patient Name: Date of Service: Tonya Hunter 06/26/2019 11:15 AM Medical Record [id number] Patient Account Number: [id number] Date of Birth/Sex: Treating RN: 1934/06/05 (85 y.o. [name] Primary Care Murray Guzzetta: [name] Other Clinician: Referring Alexy Bringle: Treating Rheanna Sergent/Extender:Stone III, [name], [name] Weeks in Treatment: 5 Vital Signs Time Taken: 11:14 Temperature (F): 97.9 Height (in): 66 Pulse (bpm): 72 Weight (lbs): 164 Respiratory Rate (breaths/min): 20 Body Mass Index (BMI): 26.5 Blood Pressure (mmHg): 126/55 Capillary Blood Glucose (mg/dl): 121 Reference Range: 80 - 120 mg / dl Electronic Signature(s) Signed: 08/28/2019 9:23:26 AM By: Sandre Kitty Entered By: Sandre Kitty on 06/26/2019 11:17:19

## 2019-08-28 NOTE — Progress Notes (Signed)
Tonya Hunter, Tonya Hunter ([id number]) Visit Report for 08/13/2019 Arrival Information Details Patient Name: Date of Service: Tonya Hunter, Tonya Hunter 08/13/2019 11:15 AM Medical Record [id number] Patient Account Number: [id number] Date of Birth/Sex: Treating RN: 12-13-1934 (85 y.o. [name] Primary Care Darcell Yacoub: [name] Other Clinician: Referring Kylinn Shropshire: Treating Shaun Zuccaro/Extender:[name], [name], [name] in Treatment: 11 Visit Information History Since Last Visit Added or deleted any medications: No Patient Arrived: [name] Any new allergies or adverse reactions: No Arrival Time: 11:53 Had a fall or experienced change in No Accompanied By: self activities of daily living that may affect Transfer Assistance: None risk of falls: Patient Identification Verified: Yes Signs or symptoms of abuse/neglect since last No Secondary Verification Process Yes visito Completed: Hospitalized since last visit: No Patient Requires Transmission-Based No Implantable device outside of the clinic excluding No Precautions: cellular tissue based products placed in the center Patient Has Alerts: Yes since last visit: Patient Alerts: Patient on Blood Has Dressing in Place as Prescribed: Yes Thinner Pain Present Now: No L ABI non compressible Electronic Signature(s) Signed: 08/28/2019 9:21:08 AM By: Sandre Kitty Entered By: Sandre Kitty on 08/13/2019 11:54:59 -------------------------------------------------------------------------------- Encounter Discharge Information Details Patient Name: Date of Service: Tonya Hunter 08/13/2019 11:15 AM Medical Record [id number] Patient Account Number: [id number] Date of Birth/Sex: Treating RN: Jul 27, 1934 (85 y.o. [name] Primary Care Jacorian Golaszewski: [name] Other Clinician: Referring Abdulhadi Stopa: Treating Nivaan Dicenzo/Extender:[name], [name], [name] in Treatment: 11 Encounter  Discharge Information Items Post Procedure Vitals Discharge Condition: Stable Temperature (F): 97.7 Ambulatory Status: Walker Pulse (bpm): 64 Discharge Destination: Home Respiratory Rate (breaths/min): 18 Transportation: Private Auto Blood Pressure (mmHg): 104/49 Accompanied By: self Schedule Follow-up Appointment: Yes Clinical Summary of Care: Patient Declined Electronic Signature(s) Signed: 08/19/2019 4:55:48 PM By: Kela Millin Entered By: Kela Millin on 08/13/2019 12:25:19 -------------------------------------------------------------------------------- Lower Extremity Assessment Details Patient Name: Date of Service: Tonya Hunter, Tonya Hunter 08/13/2019 11:15 AM Medical Record [id number] Patient Account Number: [id number] Date of Birth/Sex: Treating RN: 06-15-1934 (85 y.o. [name] Primary Care Desaray Marschner: [name] Other Clinician: Referring Jamell Opfer: Treating Ephraim Reichel/Extender:[name], [name], [name] Weeks in Treatment: 11 Edema Assessment Assessed: [Left: No] [Right: No] Edema: [Left: Ye] [Right: s] Calf Left: Right: Point of Measurement: 31 cm From Medial Instep cm 38 cm Ankle Left: Right: Point of Measurement: 9 cm From Medial Instep cm 26 cm Vascular Assessment Pulses: Dorsalis Pedis Palpable: [Right:Yes] Electronic Signature(s) Signed: 08/14/2019 5:40:01 PM By: [name] RN Signed: 08/28/2019 9:21:08 AM By: Sandre Kitty Entered By: Sandre Kitty on 08/13/2019 11:55:40 -------------------------------------------------------------------------------- Multi Wound Chart Details Patient Name: Date of Service: Tonya Hunter 08/13/2019 11:15 AM Medical Record [id number] Patient Account Number: [id number] Date of Birth/Sex: Treating RN: 25-Dec-1934 (85 y.o. 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] [7:Diabetic Wound/Ulcer of the Lower Extremity] Comorbid History: [2:Cataracts, Anemia, Angina, Cataracts, Anemia, Angina, Congestive Heart Failure, Congestive Heart Failure, Coronary Artery Disease, Coronary Artery Disease, Hypertension, Myocardial Hypertension, Myocardial Infarction, Type II  Diabetes, Infarction, Type II Diabetes, Osteoarthritis, Neuropathy Osteoarthritis, Neuropathy] [7:Cataracts, Anemia, Angina,  Congestive Heart Failure, Coronary Artery Disease, Hypertension, Myocardial Infarction, Type II Diabetes, Osteoarthritis,  Neuropathy] Date Acquired: [2:05/22/2019] [6:07/31/2019] [7:08/13/2019] Weeks of Treatment: [2:11] [6:1] [7:0] Wound Status: [2:Open] [6:Open] [7:Open] Measurements L x W x D 1.1x1.3x0.3 [6:0.4x0.3x0.1] [7:0.3x0.3x0.1] (cm) Area (cm) : [2:1.123] [6:0.094] [7:0.071] Volume (cm) : [2:0.337] [6:0.009] [7:0.007] % Reduction in Area: [2:84.50%] [6:66.80%] [7:0.00%] % Reduction in Volume: 53.40% [6:67.90%] [7:0.00%] Classification: [2:Grade 2] [6:Partial Thickness] [7:Grade 1] Exudate Amount: [2:Medium] [6:None Present] [7:Small] Exudate Type: [2:Serosanguineous] [6:N/A] [7:Serosanguineous] Exudate Color: [2:red, brown] [6:N/A] [7:red, brown] Wound Margin: [2:Epibole] [6:Flat and Intact] [7:Distinct, outline attached] Granulation Amount: [2:Medium (34-66%)] [6:None Present (0%)] [7:None Present (0%)] Granulation Quality: [2:Pink] [6:N/A] [7:N/A] Necrotic Amount: [2:Medium (34-66%)] [6:None  Present (0%)] [7:None Present (0%)] Exposed Structures: [2:Fat Layer (Subcutaneous Fascia: No Tissue) Exposed: Yes Fascia: No Tendon: No Muscle: No Joint: No Bone: No] [6:Fat Layer (Subcutaneous Tissue) Exposed: No Tendon: No Muscle: No Joint: No Bone: No] [7:Fascia: No Fat Layer (Subcutaneous Tissue)  Exposed: No Tendon: No Muscle: No Joint: No Bone: No] Epithelialization: [2:Small (1-33%)] [6:Medium (34-66%)] [7:Small (1-33%)] Debridement: [2:Debridement - Excisional N/A] [7:N/A] Pre-procedure [2:12:13] [6:N/A] [7:N/A] Verification/Time Out Taken: Pain Control: [2:Lidocaine 5% topical ointment] [6:N/A] [7:N/A] Tissue Debrided: [2:Callus, Subcutaneous, Slough] [6:N/A] [7:N/A] Level: [2:Skin/Subcutaneous Tissue] [6:N/A] [7:N/A] Debridement Area (sq cm):1.43 [6:N/A] [7:N/A] Instrument: [2:Curette] [6:N/A] [7:N/A] Bleeding: [2:Moderate] [6:N/A] [7:N/A] Hemostasis Achieved: [2:Silver Nitrate] [6:N/A] [7:N/A] Procedural Pain: [2:0] [6:N/A] [7:N/A] Post Procedural Pain: [2:0] [6:N/A] [7:N/A] Debridement Treatment Procedure was tolerated [6:N/A] [7:N/A] Response: [2:well] Post Debridement [2:1.1x1.3x0.3] [6:N/A] [7:N/A] Measurements L x W x D (cm) Post Debridement [2:0.337] [6:N/A] [7:N/A] Volume: (cm) Assessment Notes: [2:N/A] [6:fibrin noted to wound bed. N/A] [7:fibrin noted to wound bed. N/A] Treatment Notes Wound #2 (Right, Medial Toe Great) 1. Cleanse With Wound Cleanser 2. Periwound Care Skin Prep 3. Primary Dressing Applied Hydrofera Blue 4. Secondary Dressing Dry Gauze Roll Gauze 5. Secured With Tape Notes felt in shoe Wound #6 (Right, Distal, Anterior Lower Leg) 1. Cleanse With Wound Cleanser 2. Periwound Care Skin Prep 3. Primary Dressing Applied Hydrofera Blue 4. Secondary Dressing Foam Border Dressing Wound #7 (Right, Medial Lower Leg) 1. Cleanse With Wound Cleanser 2. Periwound Care Skin Prep 3. Primary Dressing Applied Hydrofera Blue 4. Secondary  Dressing Foam Border Dressing Electronic Signature(s) Signed: 08/14/2019 5:40:01 PM By: [name] RN Signed: 08/19/2019 7:44:34 AM By: Linton Ham MD Entered By: Linton Ham on 08/13/2019 13:06:22 -------------------------------------------------------------------------------- Multi-Disciplinary Care Plan Details Patient Name: Date of Service: Tonya Hunter, Tonya Hunter 08/13/2019 11:15 AM Medical Record [id number] Patient Account Number: [id number] Date of Birth/Sex: Treating RN: February 24, 1935 (85 y.o. [name] Primary Care Daran Favaro: [name] Other Clinician: Referring Yashua Bracco: Treating Rosaline Ezekiel/Extender:[name], [name], [name] in Treatment: 11 Active Inactive Wound/Skin Impairment Nursing Diagnoses: Impaired tissue integrity Knowledge deficit related to ulceration/compromised skin integrity Goals: Patient/caregiver will verbalize understanding of skin care regimen Date Initiated: 05/22/2019 Target Resolution Date: 08/16/2019 Goal Status: Active Ulcer/skin breakdown will have a volume reduction of 30% by week 4 Date Initiated: 05/22/2019 Date Inactivated: 06/19/2019 Target Resolution Date: 06/19/2019 Goal Status: Met Ulcer/skin breakdown will have a volume reduction of 50% by week 8 Date Initiated: 06/19/2019 Date Inactivated: 07/16/2019 Target Resolution Date: 07/17/2019 Goal Status: Met Ulcer/skin breakdown will have a volume reduction of 80% by week 12 Date Initiated: 07/30/2019 Target Resolution Date: 08/16/2019 Goal Status: Active Interventions: Assess patient/caregiver ability to obtain necessary supplies Assess patient/caregiver ability to perform ulcer/skin care regimen upon admission and as needed Assess ulceration(s) every visit  Provide education on ulcer and skin care Treatment Activities: Skin care regimen initiated : 05/22/2019 Topical wound management initiated : 05/22/2019 Notes: Electronic Signature(s) Signed: 08/14/2019 5:40:01 PM By:  [name] RN Entered By: [name] on 08/13/2019 12:12:11 -------------------------------------------------------------------------------- Pain Assessment Details Patient Name: Date of Service: Tonya Hunter, Tonya Hunter 08/13/2019 11:15 AM Medical Record [id number] Patient Account Number: [id number] Date of Birth/Sex: Treating RN: Aug 25, 1934 (85 y.o. [name] Primary Care Antoine Fiallos: [name] Other Clinician: Referring Juanisha Bautch: Treating Preston Weill/Extender:[name], [name], [name] in Treatment: 11 Active Problems Location of Pain Severity and Description of Pain Patient Has Paino No Site Locations Pain Management and Medication Current Pain Management: Electronic Signature(s) Signed: 08/14/2019 5:40:01 PM By: [name] RN Signed: 08/28/2019 9:21:08 AM By: Sandre Kitty Entered By: Sandre Kitty on 08/13/2019 11:55:24 -------------------------------------------------------------------------------- Patient/Caregiver Education Details Patient Name: Date of Service: Tonya Hunter 3/30/2021andnbsp11:15 AM Medical Record [id number] Patient Account Number: [id number] Date of Birth/Gender: Treating RN: [birth year] (85 y.o. [name] Primary Care Physician: [name] Other Clinician: Referring Physician: Treating Physician/Extender:[name], [name], [name] in Treatment: 11 Education Assessment Education Provided To: Patient Education Topics Provided Wound/Skin Impairment: Methods: Explain/Verbal Responses: State content correctly [employer]) Signed: 08/14/2019 5:40:01 PM By: [name] RN Entered By: [name] on 08/13/2019 12:12:32 -------------------------------------------------------------------------------- Wound Assessment Details Patient Name: Date of Service: Tonya Hunter, Tonya Hunter 08/13/2019 11:15 AM Medical Record [id number] Patient Account Number: [id number] Date of  Birth/Sex: Treating RN: 1934/08/24 (85 y.o. [name] Primary Care Jules Vidovich: [name] Other Clinician: Referring Kingdavid Leinbach: Treating Khyron Garno/Extender:[name], [name], [name] in Treatment: 11 Wound Status Wound Number: 2 Primary Diabetic Wound/Ulcer of the Lower Extremity Etiology: Wound Location: Right, Medial Toe Great Wound Open Wounding Event: Blister Status: Date Acquired: 05/22/2019 Comorbid Cataracts, Anemia, Angina, Congestive Heart Weeks Of Treatment: 11 History: Failure, Coronary Artery Disease, Clustered Wound: No Hypertension, Myocardial Infarction, Type II Diabetes, Osteoarthritis, Neuropathy Photos Photo Uploaded By: Mikeal Hawthorne on 08/16/2019 11:01:08 Wound Measurements Length: (cm) 1.1 % Reducti Width: (cm) 1.3 % Reducti Depth: (cm) 0.3 Epithelia Area: (cm) 1.123 Tunnelin Volume: (cm) 0.337 Undermin Wound Description Classification: Grade 2 Wound Margin: Epibole Exudate Amount: Medium Exudate Type: Serosanguineous Exudate Color: red, brown Wound Bed Granulation Amount: Medium (34-66%) Granulation Quality: Pink Necrotic Amount: Medium (34-66%) Necrotic Quality: Adherent Slough Foul Odor After Cleansing: No Slough/Fibrino Yes Exposed Structure Fascia Exposed: No Fat Layer (Subcutaneous Tissue) Exposed: Yes Tendon Exposed: No Muscle Exposed: No Joint Exposed: No Bone Exposed: No on in Area: 84.5% on in Volume: 53.4% lization: Small (1-33%) g: No ing: No Electronic Signature(s) Signed: 08/16/2019 4:56:09 PM By: [name] RN Previous Signature: 08/14/2019 5:08:19 PM Version By: Deon Pilling Previous Signature: 08/14/2019 5:40:01 PM Version By: [name] RN Entered By: [name] on 08/16/2019 15:26:02 -------------------------------------------------------------------------------- Wound Assessment Details Patient Name: Date of Service: Tonya Hunter 08/13/2019 11:15 AM Medical Record [id number]  Patient Account Number: [id number] Date of Birth/Sex: Treating RN: [birth year] (85 y.o. [name] Primary Care Krystn Dermody: [name] Other Clinician: Referring Monish Haliburton: Treating Mikalah Skyles/Extender:[name], [name], [name] Weeks in Treatment: 11 Wound Status Wound Number: 6 Primary Skin Tear Etiology: Wound Location: Right, Distal, Anterior Lower Leg Wound Open Wounding Event: Trauma Status: Date Acquired: 07/31/2019 Comorbid Cataracts, Anemia, Angina, Congestive Heart Weeks Of Treatment: 1 History: Failure, Coronary Artery Disease, Clustered Wound: No Hypertension, Myocardial Infarction, Type II Diabetes, Osteoarthritis, Neuropathy Photos Wound Measurements Length: (cm) 0.4 % Reduct Width: (cm) 0.3 %  Reduct Depth: (cm) 0.1 Epitheli Area: (cm) 0.094 Tunneli Volume: (cm) 0.009 Undermi Wound Description Classification: Full Thickness Without Exposed Support Foul Od Structures Slough/ Wound Flat and Intact Margin: Exudate Medium Amount: Exudate Serosanguineous Type: Exudate red, brown Color: Wound Bed Granulation Amount: None Present (0%) Necrotic Amount: None Present (0%) Fascia Fat Lay Tendon Muscle Joint E Bone Ex or After Cleansing: No Fibrino Yes Exposed Structure Exposed: No er (Subcutaneous Tissue) Exposed: No Exposed: No Exposed: No xposed: No posed: No ion in Area: 66.8% ion in Volume: 67.9% alization: Medium (34-66%) ng: No ning: No Assessment Notes fibrin noted to wound bed. Electronic Signature(s) Signed: 08/16/2019 4:56:09 PM By: [name] RN Previous Signature: 08/14/2019 5:08:19 PM Version By: Deon Pilling Previous Signature: 08/14/2019 5:40:01 PM Version By: [name] RN Entered By: [name] on 08/16/2019 15:28:32 -------------------------------------------------------------------------------- Wound Assessment Details Patient Name: Date of Service: Tonya Hunter, Tonya Hunter 08/13/2019 11:15 AM Medical Record  [id number] Patient Account Number: [id number] Date of Birth/Sex: Treating RN: [birth year] (85 y.o. [name] Primary Care Yousaf Sainato: [name] Other Clinician: Referring Ajia Chadderdon: Treating English Tomer/Extender:[name], [name], [name] Weeks in Treatment: 11 Wound Status Wound Number: 7 Primary Diabetic Wound/Ulcer of the Lower Extremity Etiology: Wound Location: Right, Medial Lower Leg Wound Open Wounding Event: Trauma Status: Date Acquired: 08/13/2019 Comorbid Cataracts, Anemia, Angina, Congestive Heart Weeks Of Treatment: 0 History: Failure, Coronary Artery Disease, Clustered Wound: No Clustered Wound: No Hypertension, Myocardial Infarction, Type II Diabetes, Osteoarthritis, Neuropathy Photos Wound Measurements Length: (cm) 0.3 Width: (cm) 0.3 Depth: (cm) 0.1 Area: (cm) 0.071 Volume: (cm) 0.007 Wound Description Classification: Grade 1 Wound Margin: Distinct, outline attached Exudate Amount: Medium Exudate Type: Serosanguineous Exudate Color: red, brown Wound Bed Granulation Amount: None Present (0%) Necrotic Amount: None Present (0%) or After Cleansing: No Fibrino Yes Exposed Structure xposed: No r (Subcutaneous Tissue) Exposed: No xposed: No xposed: No posed: No osed: No % Reduction in Area: 0% % Reduction in Volume: 0% Epithelialization: Small (1-33%) Tunneling: No Undermining: No Foul Od Slough/ Fascia E Fat Laye Tendon E Muscle E Joint Ex Bone Exp Assessment Notes fibrin noted to wound bed. Electronic Signature(s) Signed: 08/16/2019 4:56:09 PM By: [name] RN Previous Signature: 08/14/2019 5:08:19 PM Version By: Deon Pilling Previous Signature: 08/14/2019 5:40:01 PM Version By: [name] RN Entered By: [name] on 08/16/2019 15:29:03 -------------------------------------------------------------------------------- Vitals Details Patient Name: Date of Service: Tonya Hunter 08/13/2019 11:15 AM Medical  Record [id number] Patient Account Number: [id number] Date of Birth/Sex: Treating RN: 1934/09/01 (85 y.o. [name] Primary Care Rondall Radigan: [name] Other Clinician: Referring Mili Piltz: Treating Melissa Pulido/Extender:[name], [name], [name] in Treatment: 11 Vital Signs Time Taken: 11:54 Temperature (F): 97.7 Height (in): 66 Pulse (bpm): 64 Weight (lbs): 164 Respiratory Rate (breaths/min): 18 Body Mass Index (BMI): 26.5 Blood Pressure (mmHg): 103/49 Reference Range: 80 - 120 mg / dl Electronic Signature(s) Signed: 08/28/2019 9:21:08 AM By: Sandre Kitty Entered By: Sandre Kitty on 08/13/2019 11:55:16

## 2019-08-30 ENCOUNTER — Other Ambulatory Visit: Payer: Self-pay | Admitting: Pharmacist

## 2019-08-30 ENCOUNTER — Other Ambulatory Visit: Payer: Self-pay

## 2019-08-30 MED ORDER — LISINOPRIL 2.5 MG PO TABS: 2.5000 mg | ORAL_TABLET | Freq: Every day | ORAL | 0 refills | Status: DC

## 2019-09-02 ENCOUNTER — Ambulatory Visit (INDEPENDENT_AMBULATORY_CARE_PROVIDER_SITE_OTHER): Payer: Medicare Other | Admitting: Cardiovascular Disease

## 2019-09-02 ENCOUNTER — Encounter: Payer: Self-pay | Admitting: Cardiovascular Disease

## 2019-09-02 ENCOUNTER — Other Ambulatory Visit: Payer: Self-pay

## 2019-09-02 VITALS — BP 134/56 | HR 64 | Ht 66.0 in | Wt 165.0 lb

## 2019-09-02 DIAGNOSIS — I639 Cerebral infarction, unspecified: Secondary | ICD-10-CM

## 2019-09-02 DIAGNOSIS — I350 Nonrheumatic aortic (valve) stenosis: Secondary | ICD-10-CM | POA: Diagnosis not present

## 2019-09-02 NOTE — Patient Instructions (Addendum)
Medication Instructions:  *If you need a refill on your cardiac medications before your next appointment, please call your pharmacy*  Lab Work: If you have labs (blood work) drawn today and your tests are completely normal, you will receive your results only by: Marland Kitchen MyChart Message (if you have MyChart) OR . A paper copy in the mail If you have any lab test that is abnormal or we need to change your treatment, we will call you to review the results.  Testing/Procedures: Your physician has requested that you have an echocardiogram in May. Echocardiography is a painless test that uses sound waves to create images of your heart. It provides your doctor with information about the size and shape of your heart and how well your heart's chambers and valves are working. This procedure takes approximately one hour. There are no restrictions for this procedure.  Follow-Up: At [facility], you and your health needs are our priority.  As part of our continuing mission to provide you with exceptional heart care, we have created designated Provider Care Teams.  These Care Teams include your primary Cardiologist (physician) and Advanced Practice Providers (APPs -  Physician Assistants and Nurse Practitioners) who all work together to provide you with the care you need, when you need it.  We recommend signing up for the patient portal called "MyChart".  Sign up information is provided on this After Visit Summary.  MyChart is used to connect with patients for Virtual Visits (Telemedicine).  Patients are able to view lab/test results, encounter notes, upcoming appointments, etc.  Non-urgent messages can be sent to your provider as well.   To learn more about what you can do with MyChart, go to [email or web address].    Your next appointment:   6 month(s)  The format for your next appointment:   In Person  Provider:   You may see [name], MD or one of the following Advanced Practice Providers on your  designated Care Team:    Truitt Merle, NP  Cecilie Kicks, NP  Kathyrn Drown, NP

## 2019-09-03 ENCOUNTER — Encounter (HOSPITAL_BASED_OUTPATIENT_CLINIC_OR_DEPARTMENT_OTHER): Payer: Medicare Other | Admitting: Internal Medicine

## 2019-09-03 DIAGNOSIS — E11621 Type 2 diabetes mellitus with foot ulcer: Secondary | ICD-10-CM | POA: Diagnosis not present

## 2019-09-03 DIAGNOSIS — I872 Venous insufficiency (chronic) (peripheral): Secondary | ICD-10-CM | POA: Diagnosis not present

## 2019-09-03 DIAGNOSIS — E11622 Type 2 diabetes mellitus with other skin ulcer: Secondary | ICD-10-CM | POA: Diagnosis not present

## 2019-09-03 DIAGNOSIS — E1169 Type 2 diabetes mellitus with other specified complication: Secondary | ICD-10-CM | POA: Diagnosis not present

## 2019-09-03 DIAGNOSIS — M86671 Other chronic osteomyelitis, right ankle and foot: Secondary | ICD-10-CM | POA: Diagnosis not present

## 2019-09-03 DIAGNOSIS — L97512 Non-pressure chronic ulcer of other part of right foot with fat layer exposed: Secondary | ICD-10-CM | POA: Diagnosis not present

## 2019-09-03 NOTE — Progress Notes (Signed)
Tonya Hunter, Tonya Hunter ([id number]) Visit Report for 09/03/2019 Debridement Details Patient Name: Date of Service: Tonya Hunter, Tonya Hunter 09/03/2019 11:00 AM Medical Record [id number] Patient Account Number: [id number] Date of Birth/Sex: Treating RN: 1935-04-05 (85 y.o. [name] Primary Care Provider: [name] Other Clinician: Referring Provider: Treating Provider/Extender:Marquel Pottenger, [name], [name] in Treatment: 14 Debridement Performed for Wound #2 Right,Medial Toe Great Assessment: Performed By: Physician Ricard Dillon., MD Debridement Type: Debridement Severity of Tissue Pre Fat layer exposed Debridement: Level of Consciousness (Pre- Awake and Alert procedure): Pre-procedure Verification/Time Out Taken: Yes - 12:18 Start Time: 12:18 Total Area Debrided (L x W): 1 (cm) x 0.7 (cm) = 0.7 (cm) Tissue and other material Viable, Non-Viable, Subcutaneous, Skin: Dermis , Skin: Epidermis debrided: Level: Skin/Subcutaneous Tissue Debridement Description: Excisional Instrument: Curette Bleeding: Moderate Hemostasis Achieved: Pressure End Time: 12:21 Procedural Pain: 0 Post Procedural Pain: 0 Response to Treatment: Procedure was tolerated well Level of Consciousness Awake and Alert (Post-procedure): Post Debridement Measurements of Total Wound Length: (cm) 1 Width: (cm) 0.7 Depth: (cm) 0.4 Volume: (cm) 0.22 Character of Wound/Ulcer Post Improved Debridement: Severity of Tissue Post Debridement: Fat layer exposed Post Procedure Diagnosis Same as Pre-procedure Electronic Signature(s) Signed: 09/03/2019 5:39:56 PM By: [name] RN Signed: 09/03/2019 5:47:38 PM By: Linton Ham MD Entered By: Linton Ham on 09/03/2019 13:17:39 -------------------------------------------------------------------------------- HPI Details Patient Name: Date of Service: Tonya Hunter. 09/03/2019 11:00 AM Medical Record [id number] Patient Account  Number: [id number] Date of Birth/Sex: Treating RN: Jul 30, 1934 (85 y.o. [name] Primary Care Provider: [name] Other Clinician: Referring Provider: Treating Provider/Extender:Donnald Tabar, [name], [name] in Treatment: 14 History of Present Illness HPI Description: 05/22/2019 patient is seen today in regard to her bilateral lower extremities this was a referral from her primary care provider they were concerned about areas of bruising and contusion that happened on a regular basis. With that being said the patient really has an issue with her right great toe where she has a significant blister that wraps around to the underside of the toe that has me more concerned to be perfectly honest than the legs themselves do they seem to be doing quite well. I explained in regard to the legs she is good to have ongoing and continued issues with blisters based on the fragility of her skin as long as she is on the Plavix and aspirin this is likely to be an ongoing issue. Honestly what she is going need to do is protect that area. In regard to the toe however that started 2 days ago and she tells me it seems to be getting worse each day. No fevers, chills, nausea, vomiting, or diarrhea. 05/29/2019 on evaluation today patient appears to be doing much better with regard to her leg which in fact appears to be completely healed. That is on the left. In regard to her right great toe this is showing signs of improvement I am very pleased with how things seem to be progressing after we had removed the blistered skin down to an open wound last week. Overall I feel like there is new tissue growth and I am very excited that this in 1 week is doing so well. 06/05/2019 on evaluation today patient appears to be doing actually fairly well in regard to her toe ulcer. Unfortunately she does have a skin tear on the anterior surface of her right lower extremity which unfortunately is a little bit  rolled under on the edge of the  skin tear. Nonetheless I think we may be able to straighten this out and if so that would go a long way to help in this area to heal more effectively and quickly. Fortunately there is no signs of active infection at this time. She does have some swelling but again I think this is secondary to the skin tear as opposed to anything else. 06/12/2019 on evaluation today patient appears to be doing about the same in regard to the toe. With that being said she is having issues with the Steri-Strips having come off on her leg and there was some skin that actually necrosis and did slough off at this point. Fortunately there is no evidence of active infection at this time. She is very concerned about the possibility of amputation she states her daughter is also very concerned. With that being said I have not seen her daughter up to this point. Also have not really realized until today that the patient may have a component of dementia that may be preventing complete compliance and remembering of what needs to be done as far as taking care of her wound. 06/19/2019 on evaluation today patient appears to be doing well with regard to her lower extremity ulcer the toe ulcer is measuring better size wise but does have a little bit more depth. Again I do think we may want proceed with an x- ray at this point in order to further evaluate this and see if there is anything we need to do to aid in getting this to heal more appropriately and quickly. Fortunately there is no signs of systemic infection at this time. 06/26/2019 upon evaluation today patient actually appears to be doing quite well with regard to her lower extremity ulcer and her toe ulcer. I did prescribe her doxycycline in the interim since I last saw her last week and this week. She contacted the office on Friday and wanted to see about getting something at that point based on the results of the x-ray finding. There did not  appear to be any signs of osteomyelitis but there was some evidence potentially of infection. Nonetheless I did send this in for her. 2/15; patient was worked in the clinic today after calling with severe pain in the right great toe. She had an x-ray done last week that was negative. She was put on doxycycline because of possible cellulitis. There is significant erythema in the dorsal aspect of the right great toe. The remaining wound is on the plantar aspect of the interphalangeal joint of the right great toe. Other wounds that were on the lower extremities seem healed. We have been using Iodoflex to the wound on the right great toe The patient has a penicillin allergy dating back into her early 40s at that point in time she developed swelling in the foot and ataxia of gait. She did not have a rash, anaphylactoid issues as far as I can tell and as far she remembers 07/03/19 upon evaluation today patient appears to be doing still poorly in regard to her great toe ulcer. Unfortunately she tells me that she is still having discomfort she did have to see Dr. [name] due to things not doing so well on the 15th which was just a couple of days ago. He did switcher at that time as far as the antibiotics were concerned to a different oral antibiotic. This was Keflex. With that being said she maybe does appear to be doing somewhat better though she does have a  small abscess area in the toe I am still really feeling like there may be something greater going on here I think the patient may need to have them ride to further evaluate the situation. 2/23; patient seems to alternate clinics on a weekly basis. I note that she had an MRI showing a soft tissue ulceration of the medial plantar base of the great toe with underlying early osteomyelitis of the first distal phalanx. I gave her Keflex a week or so ago for what looked to be cellulitis of the right great toe resistant to doxycycline and this looks a lot  better. We have been using silver alginate. 3/2; the patient had lab work done unfortunately she spent a long time in the ER for it which was really not necessary. Her creatinine was 1.03 white count 3.5 hemoglobin 11.9 differential count essentially normal. Sedimentation rate at 32 BMP otherwise normal. C-reactive protein was not elevated at 0.6. The patient started on Levaquin last week she had already had a week of Keflex and perhaps doxycycline before this. My goal is that give her a least 3 or 4 weeks of Levaquin. She is tolerating this well without complaints 3/9; the patient is tolerating the Levaquin well. She will probably need at least another 2 or 3 weeks of this. Wound on the right medial toe looks better. Using silver alginate but I changed to silver collagen today She also scratched herself while putting on her stockings with a nail. She has a new wound on the right medial calf 3/16; the patient is tolerating the Levaquin she will need about another week this will give her 6 weeks of antibiotics. The wound on the right medial toe had eschar around it debris. More concerning than this she had a new satellite lesion more dorsally around the medial aspect. She thinks this came up about 3 days ago. She still has the scratch injury on the left leg from last time 3/23; the patient should have about 3 or 4 more days of Levaquin and not we will give her 6 weeks of treatment. The wound on the right medial toe has thick callus and eschar indicative of unrelieved offloading. She is unsteady on her feet uses a walker unfortunately there is not much alternative here. She had a skin lesion on her right lower lip leg last week which was minor trauma in the setting of severe chronic venous insufficiency. These healed however she has a new 1 more anteriorly. She says this is when her husband was struggling to get her compression stocking on. 3/30; the patient has completed her antibiotics. She still  has thick callus around the wound on the medial right first toe. She also has new punched out areas in her lower anterior tibia apparently coming from trauma when she attempts to put on her 20/30 below-knee stockings. She says both her and her husband are having a lot of difficulty here. 4/6; patient has completed antibiotics for osteomyelitis of the right great toe. The area on the lower anterior tibia in the setting of severe chronic venous insufficiency is just about closed. 4/20; patient has completed her antibiotics. Right great toe is still about the same. I do not think we have enough offloading. She has bilateral stockings there is nothing on either anterior tibia. She has juxta lite stockings but claims she cannot get them on by herself Electronic Signature(s) Signed: 09/03/2019 5:47:38 PM By: Linton Ham MD Entered By: Linton Ham on 09/03/2019 13:19:19 -------------------------------------------------------------------------------- Physical Exam Details  Patient Name: Date of Service: Tonya Hunter, Tonya Hunter 09/03/2019 11:00 AM Medical Record [id number] Patient Account Number: [id number] Date of Birth/Sex: Treating RN: 04/02/1935 (85 y.o. [name] Primary Care Provider: [name] Other Clinician: Referring Provider: Treating Provider/Extender:Trellis Vanoverbeke, [name], [name] in Treatment: 14 Constitutional Sitting or standing Blood Pressure is within target range for patient.. Pulse regular and within target range for patient.Marland Kitchen Respirations regular, non-labored and within target range.. Temperature is normal and within the target range for the patient.Marland Kitchen Appears in no distress. Notes Wound exam; the area on the medial part of her right great toe. Surface does not look too bad however once again she has skin and subcutaneous tissue caving in over the wound surface. There is absolutely no way this is going to heal like this. Using a #5 curette I  removed the surrounding skin and subcutaneous tissue hemostasis with silver nitrate. There is no clear infection and no palpable bone Electronic Signature(s) Signed: 09/03/2019 5:47:38 PM By: Linton Ham MD Entered By: Linton Ham on 09/03/2019 13:20:24 -------------------------------------------------------------------------------- Physician Orders Details Patient Name: Date of Service: Tonya Hunter 09/03/2019 11:00 AM Medical Record [id number] Patient Account Number: [id number] Date of Birth/Sex: Treating RN: [birth year] (85 y.o. [name] Primary Care Provider: [name] Other Clinician: Referring Provider: Treating Provider/Extender:Quinnton Bury, [name], [name] in Treatment: 14 Verbal / Phone Orders: No Diagnosis Coding ICD-10 Coding Code Description L97.512 Non-pressure chronic ulcer of other part of right foot with fat layer exposed M86.671 Other chronic osteomyelitis, right ankle and foot I87.2 Venous insufficiency (chronic) (peripheral) S81.801A Unspecified open wound, right lower leg, initial encounter E11.622 Type 2 diabetes mellitus with other skin ulcer Follow-up Appointments Return Appointment in 1 week. Dressing Change Frequency Wound #2 Right,Medial Toe Great Change Dressing every other day. Skin Barriers/Peri-Wound Care Moisturizing lotion - to right leg Wound Cleansing Wound #2 Right,Medial Toe Great Clean wound with Wound Cleanser Primary Wound Dressing Wound #2 Right,Medial Toe Great Hydrofera Blue - moisten with normal saline Secondary Dressing Wound #2 Right,Medial Toe Great Kerlix/Rolled Gauze Dry Gauze Edema Control Avoid standing for long periods of time Elevate legs to the level of the heart or above for 30 minutes daily and/or when sitting, a frequency of: - whenever sitting throughout the day Exercise regularly Support Garment 20-30 mm/Hg pressure to: - to both legs daily Off-Loading Open toe  surgical shoe to: - RIGHT FOOT felt to shoe Electronic Signature(s) Signed: 09/03/2019 5:39:56 PM By: [name] RN Signed: 09/03/2019 5:47:38 PM By: Linton Ham MD Entered By: [name] on 09/03/2019 12:20:43 -------------------------------------------------------------------------------- Problem List Details Patient Name: Date of Service: Tonya Hunter 09/03/2019 11:00 AM Medical Record [id number] Patient Account Number: [id number] Date of Birth/Sex: Treating RN: January 14, 1935 (85 y.o. [name] Primary Care Provider: [name] Other Clinician: Referring Provider: Treating Provider/Extender:Ayslin Kundert, [name], [name] in Treatment: 14 Active Problems ICD-10 Evaluated Encounter Code Description Active Date Today Diagnosis L97.512 Non-pressure chronic ulcer of other part of right foot 05/22/2019 No Yes with fat layer exposed M86.671 Other chronic osteomyelitis, right ankle and foot 07/09/2019 No Yes I87.2 Venous insufficiency (chronic) (peripheral) 05/22/2019 No Yes S81.801A Unspecified open wound, right lower leg, initial 06/05/2019 No Yes encounter E11.622 Type 2 diabetes mellitus with other skin ulcer 05/22/2019 No Yes Inactive Problems ICD-10 Code Description Active Date Inactive Date L97.822 Non-pressure chronic ulcer of other part of left lower leg with 05/22/2019 05/22/2019 fat layer exposed I10 Essential (primary) hypertension 05/22/2019 05/22/2019 I50.42  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 05/24/3788 (congestive) heart failure L03.115 Cellulitis of right lower limb 07/01/2019 07/01/2019 I25.10 Atherosclerotic heart disease of native coronary artery without 05/22/2019 05/22/2019 angina pectoris Resolved Problems Electronic Signature(s) Signed: 09/03/2019 5:47:38 PM By: Linton Ham MD Entered By: Linton Ham on 09/03/2019  13:17:15 -------------------------------------------------------------------------------- Progress Note Details Patient Name: Date of Service: Tonya Hunter 09/03/2019 11:00 AM Medical Record [id number] Patient Account Number: [id number] Date of Birth/Sex: Treating RN: 1934-12-20 (85 y.o. [name] Primary Care Provider: [name] Other Clinician: Referring Provider: Treating Provider/Extender:Traniece Boffa, [name], [name] in Treatment: 14 Subjective History of Present Illness (HPI) 05/22/2019 patient is seen today in regard to her bilateral lower extremities this was a referral from her primary care provider they were concerned about areas of bruising and contusion that happened on a regular basis. With that being said the patient really has an issue with her right great toe where she has a significant blister that wraps around to the underside of the toe that has me more concerned to be perfectly honest than the legs themselves do they seem to be doing quite well. I explained in regard to the legs she is good to have ongoing and continued issues with blisters based on the fragility of her skin as long as she is on the Plavix and aspirin this is likely to be an ongoing issue. Honestly what she is going need to do is protect that area. In regard to the toe however that started 2 days ago and she tells me it seems to be getting worse each day. No fevers, chills, nausea, vomiting, or diarrhea. 05/29/2019 on evaluation today patient appears to be doing much better with regard to her leg which in fact appears to be completely healed. That is on the left. In regard to her right great toe this is showing signs of improvement I am very pleased with how things seem to be progressing after we had removed the blistered skin down to an open wound last week. Overall I feel like there is new tissue growth and I am very excited that this in 1 week is doing  so well. 06/05/2019 on evaluation today patient appears to be doing actually fairly well in regard to her toe ulcer. Unfortunately she does have a skin tear on the anterior surface of her right lower extremity which unfortunately is a little bit rolled under on the edge of the skin tear. Nonetheless I think we may be able to straighten this out and if so that would go a long way to help in this area to heal more effectively and quickly. Fortunately there is no signs of active infection at this time. She does have some swelling but again I think this is secondary to the skin tear as opposed to anything else. 06/12/2019 on evaluation today patient appears to be doing about the same in regard to the toe. With that being said she is having issues with the Steri-Strips having come off on her leg and there was some skin that actually necrosis and did slough off at this point. Fortunately there is no evidence of active infection at this time. She is very concerned about the possibility of amputation she states her daughter is also very concerned. With that being said I have not seen her daughter up to this point. Also have not really realized until today that the patient may have a component of dementia that may be preventing complete compliance and  remembering of what needs to be done as far as taking care of her wound. 06/19/2019 on evaluation today patient appears to be doing well with regard to her lower extremity ulcer the toe ulcer is measuring better size wise but does have a little bit more depth. Again I do think we may want proceed with an x- ray at this point in order to further evaluate this and see if there is anything we need to do to aid in getting this to heal more appropriately and quickly. Fortunately there is no signs of systemic infection at this time. 06/26/2019 upon evaluation today patient actually appears to be doing quite well with regard to her lower extremity ulcer and her toe  ulcer. I did prescribe her doxycycline in the interim since I last saw her last week and this week. She contacted the office on Friday and wanted to see about getting something at that point based on the results of the x-ray finding. There did not appear to be any signs of osteomyelitis but there was some evidence potentially of infection. Nonetheless I did send this in for her. 2/15; patient was worked in the clinic today after calling with severe pain in the right great toe. She had an x-ray done last week that was negative. She was put on doxycycline because of possible cellulitis. There is significant erythema in the dorsal aspect of the right great toe. The remaining wound is on the plantar aspect of the interphalangeal joint of the right great toe. Other wounds that were on the lower extremities seem healed. We have been using Iodoflex to the wound on the right great toe The patient has a penicillin allergy dating back into her early 67s at that point in time she developed swelling in the foot and ataxia of gait. She did not have a rash, anaphylactoid issues as far as I can tell and as far she remembers 07/03/19 upon evaluation today patient appears to be doing still poorly in regard to her great toe ulcer. Unfortunately she tells me that she is still having discomfort she did have to see Dr. [name] due to things not doing so well on the 15th which was just a couple of days ago. He did switcher at that time as far as the antibiotics were concerned to a different oral antibiotic. This was Keflex. With that being said she maybe does appear to be doing somewhat better though she does have a small abscess area in the toe I am still really feeling like there may be something greater going on here I think the patient may need to have them ride to further evaluate the situation. 2/23; patient seems to alternate clinics on a weekly basis. I note that she had an MRI showing a soft tissue ulceration of  the medial plantar base of the great toe with underlying early osteomyelitis of the first distal phalanx. I gave her Keflex a week or so ago for what looked to be cellulitis of the right great toe resistant to doxycycline and this looks a lot better. We have been using silver alginate. 3/2; the patient had lab work done unfortunately she spent a long time in the ER for it which was really not necessary. Her creatinine was 1.03 white count 3.5 hemoglobin 11.9 differential count essentially normal. Sedimentation rate at 32 BMP otherwise normal. C-reactive protein was not elevated at 0.6. The patient started on Levaquin last week she had already had a week of Keflex and perhaps doxycycline  before this. My goal is that give her a least 3 or 4 weeks of Levaquin. She is tolerating this well without complaints 3/9; the patient is tolerating the Levaquin well. She will probably need at least another 2 or 3 weeks of this. Wound on the right medial toe looks better. Using silver alginate but I changed to silver collagen today She also scratched herself while putting on her stockings with a nail. She has a new wound on the right medial calf 3/16; the patient is tolerating the Levaquin she will need about another week this will give her 6 weeks of antibiotics. The wound on the right medial toe had eschar around it debris. More concerning than this she had a new satellite lesion more dorsally around the medial aspect. She thinks this came up about 3 days ago. She still has the scratch injury on the left leg from last time 3/23; the patient should have about 3 or 4 more days of Levaquin and not we will give her 6 weeks of treatment. The wound on the right medial toe has thick callus and eschar indicative of unrelieved offloading. She is unsteady on her feet uses a walker unfortunately there is not much alternative here. She had a skin lesion on her right lower lip leg last week which was minor trauma in the  setting of severe chronic venous insufficiency. These healed however she has a new 1 more anteriorly. She says this is when her husband was struggling to get her compression stocking on. 3/30; the patient has completed her antibiotics. She still has thick callus around the wound on the medial right first toe. She also has new punched out areas in her lower anterior tibia apparently coming from trauma when she attempts to put on her 20/30 below-knee stockings. She says both her and her husband are having a lot of difficulty here. 4/6; patient has completed antibiotics for osteomyelitis of the right great toe. The area on the lower anterior tibia in the setting of severe chronic venous insufficiency is just about closed. 4/20; patient has completed her antibiotics. Right great toe is still about the same. I do not think we have enough offloading. She has bilateral stockings there is nothing on either anterior tibia. She has juxta lite stockings but claims she cannot get them on by herself Objective Constitutional Sitting or standing Blood Pressure is within target range for patient.. Pulse regular and within target range for patient.Marland Kitchen Respirations regular, non-labored and within target range.. Temperature is normal and within the target range for the patient.Marland Kitchen Appears in no distress. Vitals Time Taken: 11:44 AM, Height: 66 in, Weight: 164 lbs, BMI: 26.5, Temperature: 98.1 F, Pulse: 73 bpm, Respiratory Rate: 18 breaths/min, Blood Pressure: 119/63 mmHg. General Notes: Wound exam; the area on the medial part of her right great toe. Surface does not look too bad however once again she has skin and subcutaneous tissue caving in over the wound surface. There is absolutely no way this is going to heal like this. Using a #5 curette I removed the surrounding skin and subcutaneous tissue hemostasis with silver nitrate. There is no clear infection and no palpable bone Integumentary (Hair, Skin) Wound #2  status is Open. Original cause of wound was Blister. The wound is located on the [employer]. The wound measures 1cm length x 0.7cm width x 0.4cm depth; 0.55cm^2 area and 0.22cm^3 volume. There is Fat Layer (Subcutaneous Tissue) Exposed exposed. There is no tunneling or undermining noted. There is  a small amount of serosanguineous drainage noted. The wound margin is thickened. There is large (67-100%) red granulation within the wound bed. There is a small (1-33%) amount of necrotic tissue within the wound bed including Adherent Slough. Wound #7 status is Healed - Epithelialized. Original cause of wound was Trauma. The wound is located on the Right,Medial Lower Leg. The wound measures 0cm length x 0cm width x 0cm depth; 0cm^2 area and 0cm^3 volume. There is no tunneling or undermining noted. There is a none present amount of drainage noted. The wound margin is distinct with the outline attached to the wound base. There is no granulation within the wound bed. There is no necrotic tissue within the wound bed. 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 There was a Excisional Skin/Subcutaneous Tissue Debridement with a total area of 0.7 sq cm performed by Ricard Dillon., MD. With the following instrument(s): Curette to remove Viable and Non-Viable tissue/material. Material removed includes Subcutaneous Tissue, Skin: Dermis, and Skin: Epidermis. No specimens were taken. A time out was conducted at 12:18, prior to the  start of the procedure. A Moderate amount of bleeding was controlled with Pressure. The procedure was tolerated well with a pain level of 0 throughout and a pain level of 0 following the procedure. Post Debridement Measurements: 1cm length x 0.7cm width x 0.4cm depth; 0.22cm^3 volume. Character of Wound/Ulcer Post Debridement is improved. Severity of Tissue Post Debridement is: Fat layer exposed. Post procedure Diagnosis Wound #2: Same as Pre-Procedure Plan Follow-up Appointments: Return Appointment in 1 week. Dressing Change Frequency: Wound #2 Right,Medial Toe Great: Change Dressing every other day. Skin Barriers/Peri-Wound Care: Moisturizing lotion - to right leg Wound Cleansing: Wound #2 Right,Medial Toe Great: Clean wound with Wound Cleanser Primary Wound Dressing: Wound #2 Right,Medial Toe Great: Hydrofera Blue - moisten with normal saline Secondary Dressing: Wound #2 Right,Medial Toe Great: Kerlix/Rolled Gauze Dry Gauze Edema Control: Avoid standing for long periods of time Elevate legs to the level of the heart or above for 30 minutes daily and/or when sitting, a frequency of: - whenever sitting throughout the day Exercise regularly Support Garment 20-30 mm/Hg pressure to: - to both legs daily Off-Loading: Open toe surgical shoe to: - RIGHT FOOT felt to shoe 1. Continue with Hydrofera Blue to the right great toe 2. I do not think we are offloading this properly. I am uncertain at this point whether she be a candidate for a total contact cast somehow I doubt it. 3. The patient has completed her antibiotics for osteomyelitis. The wound does not look infected Electronic Signature(s) Signed: 09/03/2019 5:47:38 PM By: Linton Ham MD Entered By: Linton Ham on 09/03/2019 13:22:20 -------------------------------------------------------------------------------- SuperBill Details Patient Name: Date of Service: Tonya Hunter 09/03/2019 Medical Record  [id number] Patient Account Number: [id number] Date of Birth/Sex: Treating RN: May 09, 1935 (85 y.o. [name] Primary Care Provider: [name] Other Clinician: Referring Provider: Treating Provider/Extender:Kristianne Albin, [name], [name] in Treatment: 14 Diagnosis Coding ICD-10 Codes Code Description [phone] Non-pressure chronic ulcer of other part of right foot with fat layer exposed M86.671 Other chronic osteomyelitis,  right ankle and foot I87.2 Venous insufficiency (chronic) (peripheral) S81.801A Unspecified open wound, right lower leg, initial encounter E11.622 Type 2 diabetes mellitus with other skin ulcer Facility Procedures CPT4 Code Description: [id number] 11042 - DEB SUBQ TISSUE 20 SQ CM/< ICD-10 Diagnosis Description L97.512 Non-pressure chronic ulcer of other part of right foot with f Modifier: at layer exposed Quantity: 1 Physician Procedures CPT4 Code Description: [id number] 11042 - WC PHYS SUBQ TISS 20 SQ CM ICD-10 Diagnosis Description L97.512 Non-pressure chronic ulcer of other part of right foot with fa Modifier: t layer exposed Quantity: 1 Electronic Signature(s) Signed: 09/03/2019 5:47:38 PM By: Linton Ham MD Entered By: Linton Ham on 09/03/2019 13:22:37

## 2019-09-03 NOTE — Progress Notes (Addendum)
Tonya Hunter, Tonya Hunter ([id number]) Visit Report for 09/03/2019 Arrival Information Details Patient Name: Date of Service: Tonya Hunter, Tonya Hunter 09/03/2019 11:00 AM Medical Record [id number] Patient Account Number: [id number] Date of Birth/Sex: Treating RN: 04-Oct-1934 (85 y.o. [name] Primary Care Raymont Andreoni: [name] Other Clinician: Referring Jermal Dismuke: Treating Jena Tegeler/Extender:[name], [name], [name] in Treatment: 14 Visit Information History Since Last Visit Added or deleted any medications: No Patient Arrived: [name] Any new allergies or adverse reactions: No Arrival Time: 11:44 Had a fall or experienced change in No Accompanied By: self activities of daily living that may affect Transfer Assistance: None risk of falls: Patient Identification Verified: Yes Signs or symptoms of abuse/neglect since last No Secondary Verification Process Yes visito Completed: Hospitalized since last visit: No Patient Requires Transmission-Based No Implantable device outside of the clinic excluding No Precautions: cellular tissue based products placed in the center Patient Has Alerts: Yes since last visit: Patient Alerts: Patient on Blood Has Dressing in Place as Prescribed: Yes Thinner Pain Present Now: No L ABI non compressible Electronic Signature(s) Signed: 09/03/2019 1:29:28 PM By: Sandre Kitty Entered By: Sandre Kitty on 09/03/2019 11:44:30 -------------------------------------------------------------------------------- Complex / Palliative Patient Assessment Details Patient Name: Date of Service: AVYANNA, SPADA 09/03/2019 11:00 AM Medical Record [id number] Patient Account Number: [id number] Date of Birth/Sex: Treating RN: Jun 30, 1934 (85 y.o. [name] Primary Care Breccan Galant: [name] Other Clinician: Referring Opel Lejeune: Treating Neta Upadhyay/Extender:[name], [name], [name] in Treatment: 14 Palliative  Management Criteria Complex Wound Management Criteria Patient has remarkable or complex co-morbidities requiring medications or treatments that extend wound healing times. Examples: Diabetes mellitus with chronic renal failure or end stage renal disease requiring dialysis Advanced or poorly controlled rheumatoid arthritis Diabetes mellitus and end stage chronic obstructive pulmonary disease Active cancer with current chemo- or radiation therapy Type 2 Diabetes, Osteomyelitis, CAD Care Approach Wound Care Plan: Complex Wound Management Electronic Signature(s) Signed: 09/04/2019 11:18:43 AM By: Levan Hurst RN, BSN Signed: 09/06/2019 5:22:40 PM By: Linton Ham MD Entered By: Levan Hurst on 09/04/2019 09:57:07 -------------------------------------------------------------------------------- Encounter Discharge Information Details Patient Name: Date of Service: Tonya Hunter. 09/03/2019 11:00 AM Medical Record [id number] Patient Account Number: [id number] Date of Birth/Sex: Treating RN: October 28, 1934 (85 y.o. [name] Primary Care Shaquoia Miers: [name] Other Clinician: Referring Anthea Udovich: Treating Meiah Zamudio/Extender:[name], [name], [name] in Treatment: 14 Encounter Discharge Information Items Post Procedure Vitals Discharge Condition: Stable Temperature (F): 98.1 Ambulatory Status: Walker Pulse (bpm): 73 Discharge Destination: Home Respiratory Rate (breaths/min): 18 Transportation: Private Auto Blood Pressure (mmHg): 119/63 Accompanied By: self Schedule Follow-up Appointment: Yes Clinical Summary of Care: Patient Declined Electronic Signature(s) Signed: 09/03/2019 5:57:12 PM By: Kela Millin Entered By: Kela Millin on 09/03/2019 12:33:38 -------------------------------------------------------------------------------- Lower Extremity Assessment Details Patient Name: Date of Service: Tonya Hunter 09/03/2019 11:00  AM Medical Record [id number] Patient Account Number: [id number] Date of Birth/Sex: Treating RN: [birth year] (85 y.o. [name] Primary Care Obelia Bonello: [name] Other Clinician: Referring Osaze Hubbert: Treating Tattiana Fakhouri/Extender:[name], [name], [name] Weeks in Treatment: 14 Edema Assessment Assessed: [Left: No] [Right: No] Edema: [Left: Ye] [Right: s] Calf Left: Right: Point of Measurement: 31 cm From Medial Instep cm 37 cm Ankle Left: Right: Point of Measurement: 9 cm From Medial Instep cm 25 cm Vascular Assessment Pulses: Dorsalis Pedis Palpable: [Right:Yes] Electronic Signature(s) Signed: 09/03/2019 5:57:12 PM By: Kela Millin Entered By: Kela Millin on 09/03/2019 11:57:44 -------------------------------------------------------------------------------- Multi Wound Chart Details Patient Name: Date of Service: Tonya Hunter. 09/03/2019  11:00 AM Medical Record [id number] Patient Account Number: [id number] Date of Birth/Sex: Treating RN: 1935-01-06 (85 y.o. [name] Primary Care Joshue Badal: [name] Other Clinician: Referring Analeia Ismael: Treating Tra Wilemon/Extender:[name], [name], [name] Weeks in Treatment: 14 Vital Signs Height(in): 66 Pulse(bpm): 62 Weight(lbs): 164 Blood Pressure(mmHg): 119/63 Body Mass Index(BMI): 26 Temperature(F): 98.1 Respiratory 18 Rate(breaths/min): Photos: [2:No Photos] [7:No Photos] [N/A:N/A] Wound Location: [2:Right, Medial Toe Great] [7:Right, Medial Lower Leg] [N/A:N/A] Wounding Event: [2:Blister] [7:Trauma] [N/A:N/A] Primary Etiology: [2:Diabetic Wound/Ulcer of the Venous Leg Ulcer Lower Extremity] [N/A:N/A] Comorbid History: [2:Cataracts, Anemia, Angina, Cataracts, Anemia, Angina, Congestive Heart Failure, Congestive Heart Failure, Coronary Artery Disease, Coronary Artery Disease, Hypertension, Myocardial Hypertension, Myocardial Infarction, Type II  Diabetes,  Infarction, Type II Diabetes, Osteoarthritis, Neuropathy] [7:Osteoarthritis, Neuropathy] [N/A:N/A] Date Acquired: [2:05/22/2019] [7:08/13/2019] [N/A:N/A] Weeks of Treatment: [2:14] [7:3] [N/A:N/A] Wound Status: [2:Open] [7:Healed - Epithelialized] [N/A:N/A] Measurements L x W x D 1x0.7x0.4 [7:0x0x0] [N/A:N/A] (cm) Area (cm) : [2:0.55] [7:0] [N/A:N/A] Volume (cm) : [2:0.22] [7:0] [N/A:N/A] % Reduction in Area: [2:92.40%] [7:100.00%] [N/A:N/A] % Reduction in Volume: 69.60% [7:100.00%] [N/A:N/A] Classification: [2:Grade 2] [7:Full Thickness Without Exposed Support Structures] [N/A:N/A] Exudate Amount: [2:Small] [7:None Present] [N/A:N/A] Exudate Type: [2:Serosanguineous] [7:N/A] [N/A:N/A] Exudate Color: [2:red, brown] [7:N/A] [N/A:N/A] Wound Margin: [2:Thickened] [7:Distinct, outline attached N/A] Granulation Amount: [2:Large (67-100%)] [7:None Present (0%)] [N/A:N/A] Granulation Quality: [2:Red] [7:N/A] [N/A:N/A] Necrotic Amount: [2:Small (1-33%)] [7:None Present (0%)] [N/A:N/A] Exposed Structures: [2:Fat Layer (Subcutaneous Tissue) Exposed: Yes Fascia: No Tendon: No Muscle: No Joint: No Bone: No] [7:Fascia: No Fat Layer (Subcutaneous Tissue) Exposed: No Tendon: No Muscle: No Joint: No Bone: No] [N/A:N/A] Epithelialization: [2:Small (1-33%)] [7:None] [N/A:N/A] Debridement: [2:Debridement - Excisional] [7:N/A] [N/A:N/A] Pre-procedure [2:12:18] [7:N/A] [N/A:N/A] Verification/Time Out Taken: Tissue Debrided: [2:Subcutaneous] [7:N/A] [N/A:N/A] Level: [2:Skin/Subcutaneous Tissue] [7:N/A] [N/A:N/A] Debridement Area (sq cm):0.7 [7:N/A] [N/A:N/A] Instrument: [2:Curette] [7:N/A] [N/A:N/A] Bleeding: [2:Moderate] [7:N/A] [N/A:N/A] Hemostasis Achieved: [2:Pressure] [7:N/A] [N/A:N/A] Procedural Pain: [2:0] [7:N/A] [N/A:N/A] Post Procedural Pain: [2:0] [7:N/A] [N/A:N/A] Debridement Treatment Procedure was tolerated [7:N/A] [N/A:N/A] Response: [2:well] Post Debridement [2:1x0.7x0.4] [7:N/A]  [N/A:N/A] Measurements L x W x D (cm) Post Debridement [2:0.22] [7:N/A] [N/A:N/A] Volume: (cm) Procedures Performed: Debridement [7:N/A] [N/A:N/A] Treatment Notes Wound #2 (Right, Medial Toe Great) 1. Cleanse With Wound Cleanser 2. Periwound Care Skin Prep 3. Primary Dressing Applied Hydrofera Blue 4. Secondary Dressing Dry Gauze Roll Gauze 5. Secured With Tape 7. Footwear/Offloading device applied Felt/Foam Surgical shoe Notes netting Electronic Signature(s) Signed: 09/03/2019 5:39:56 PM By: [name] RN Signed: 09/03/2019 5:47:38 PM By: Linton Ham MD Entered By: Linton Ham on 09/03/2019 13:17:30 -------------------------------------------------------------------------------- Multi-Disciplinary Care Plan Details Patient Name: Date of Service: Tonya Hunter. 09/03/2019 11:00 AM Medical Record [id number] Patient Account Number: [id number] Date of Birth/Sex: Treating RN: May 22, 1934 (85 y.o. [name] Primary Care Haylin Camilli: [name] Other Clinician: Referring Kyrin Garn: Treating Arvilla Salada/Extender:[name], [name], [name] in Treatment: 14 Active Inactive Wound/Skin Impairment Nursing Diagnoses: Impaired tissue integrity Knowledge deficit related to ulceration/compromised skin integrity Goals: Patient/caregiver will verbalize understanding of skin care regimen Date Initiated: 05/22/2019 Target Resolution Date: 09/17/2019 Goal Status: Active Ulcer/skin breakdown will have a volume reduction of 30% by week 4 Date Initiated: 05/22/2019 Date Inactivated: 06/19/2019 Target Resolution Date: 06/19/2019 Goal Status: Met Ulcer/skin breakdown will have a volume reduction of 50% by week 8 Date Initiated: 06/19/2019 Date Inactivated: 07/16/2019 Target Resolution Date: 07/17/2019 Goal Status: Met Ulcer/skin breakdown will have a volume reduction of 80% by week 12 Date Initiated: 07/30/2019 Date Inactivated: 08/20/2019 Target Resolution Date:  08/16/2019  Goal Status: Met Ulcer/skin breakdown will heal within 14 weeks Date Initiated: 08/20/2019 Target Resolution Date: 09/20/2019 Goal Status: Active Interventions: Assess patient/caregiver ability to obtain necessary supplies Assess patient/caregiver ability to perform ulcer/skin care regimen upon admission and as needed Assess ulceration(s) every visit Provide education on ulcer and skin care Treatment Activities: Skin care regimen initiated : 05/22/2019 Topical wound management initiated : 05/22/2019 Notes: Electronic Signature(s) Signed: 09/03/2019 5:39:56 PM By: [name] RN Entered By: [name] on 09/03/2019 11:23:19 -------------------------------------------------------------------------------- Pain Assessment Details Patient Name: Date of Service: Tonya Hunter, Tonya Hunter 09/03/2019 11:00 AM Medical Record [id number] Patient Account Number: [id number] Date of Birth/Sex: Treating RN: 1934/11/11 (85 y.o. [name] Primary Care Anique Beckley: [name] Other Clinician: Referring Eloy Fehl: Treating Aidenn Skellenger/Extender:[name], [name], [name] in Treatment: 14 Active Problems Location of Pain Severity and Description of Pain Patient Has Paino No Site Locations Pain Management and Medication Current Pain Management: Electronic Signature(s) Signed: 09/03/2019 1:29:28 PM By: Sandre Kitty Signed: 09/03/2019 5:39:56 PM By: [name] RN Entered By: Sandre Kitty on 09/03/2019 11:45:01 -------------------------------------------------------------------------------- Patient/Caregiver Education Details Patient Name: Date of Service: Grimsley, Naima S. 4/20/2021andnbsp11:00 AM Medical Record [id number] Patient Account Number: [id number] Date of Birth/Gender: Treating RN: 23-Apr-1935 (85 y.o. [name] Primary Care Physician: [name] Other Clinician: Referring Physician: Treating Physician/Extender:[name], [name],  [name] in Treatment: 14 Education Assessment Education Provided To: Patient Education Topics Provided Wound/Skin Impairment: Methods: Explain/Verbal Responses: State content correctly [employer]) Signed: 09/03/2019 5:39:56 PM By: [name] RN Entered By: [name] on 09/03/2019 11:23:36 -------------------------------------------------------------------------------- Wound Assessment Details Patient Name: Date of Service: Tonya Hunter, Tonya Hunter 09/03/2019 11:00 AM Medical Record [id number] Patient Account Number: [id number] Date of Birth/Sex: Treating RN: 1935-02-02 (85 y.o. [name] Primary Care Shahana Capes: [name] Other Clinician: Referring Liliana Brentlinger: Treating Analie Katzman/Extender:[name], [name], [name] Weeks in Treatment: 14 Wound Status Wound Number: 2 Primary Diabetic Wound/Ulcer of the Lower Extremity Etiology: Wound Location: Right, Medial Toe Great Wound Open Wounding Event: Blister Status: Date Acquired: 05/22/2019 Comorbid Cataracts, Anemia, Angina, Congestive Heart Weeks Of Treatment: 14 History: Failure, Coronary Artery Disease, Clustered Wound: No Hypertension, Myocardial Infarction, Type II Diabetes, Osteoarthritis, Neuropathy Photos Photo Uploaded By: Mikeal Hawthorne on 09/06/2019 11:22:12 Wound Measurements Length: (cm) 1 % Reduction Width: (cm) 0.7 % Reduction Depth: (cm) 0.4 Epithelializ Area: (cm) 0.55 Tunneling: Volume: (cm) 0.22 Undermining Wound Description Classification: Grade 2 Wound Margin: Thickened Exudate Amount: Small Exudate Type: Serosanguineous Exudate Color: red, brown Wound Bed Granulation Amount: Large (67-100%) Granulation Quality: Red Necrotic Amount: Small (1-33%) Necrotic Quality: Adherent Slough Foul Odor After Cleansing: No Slough/Fibrino Yes Exposed Structure Fascia Exposed: No Fat Layer (Subcutaneous Tissue) Exposed: Yes Tendon Exposed: No Muscle Exposed:  No Joint Exposed: No Bone Exposed: No in Area: 92.4% in Volume: 69.6% ation: Small (1-33%) No : No Treatment Notes Wound #2 (Right, Medial Toe Great) 1. Cleanse With Wound Cleanser 2. Periwound Care Skin Prep 3. Primary Dressing Applied Hydrofera Blue 4. Secondary Dressing Dry Gauze Roll Gauze 5. Secured With Tape 7. Footwear/Offloading device applied Felt/Foam Surgical shoe Notes netting Electronic Signature(s) Signed: 09/03/2019 5:39:56 PM By: [name] RN Signed: 09/03/2019 5:57:12 PM By: Kela Millin Entered By: Kela Millin on 09/03/2019 11:58:03 -------------------------------------------------------------------------------- Wound Assessment Details Patient Name: Date of Service: Tonya Hunter 09/03/2019 11:00 AM Medical Record [id number] Patient Account Number: [id number] Date of Birth/Sex: Treating RN: 01/13/1935 (85 y.o. [name] Primary Care Tymier Lindholm: [name] Other Clinician: Referring Miangel Flom:  Treating Harrington Jobe/Extender:[name], [name], [name] Weeks in Treatment: 14 Wound Status Wound Number: 7 Primary Venous Leg Ulcer Etiology: Wound Location: Right, Medial Lower Leg Wound Healed - Epithelialized Wounding Event: Trauma Status: Date Acquired: 08/13/2019 Comorbid Cataracts, Anemia, Angina, Congestive Heart Weeks Of Treatment: 3 History: Failure, Coronary Artery Disease, Clustered Wound: No Hypertension, Myocardial Infarction, Type II Diabetes, Osteoarthritis, Neuropathy Wound Measurements Length: (cm) 0 % Reduct Width: (cm) 0 % Reduct Depth: (cm) 0 Epitheli Area: (cm) 0 Tunneli Volume: (cm) 0 Undermi Wound Description Classification: Full Thickness Without Exposed Support Structures Wound Distinct, outline attached Margin: Exudate None Present Amount: Wound Bed Granulation Amount: None Present (0%) Necrotic Amount: None Present (0%) Foul Odor After Cleansing: No Slough/Fibrino  No Exposed Structure Fascia Exposed: No Fat Layer (Subcutaneous Tissue) Exposed: No Tendon Exposed: No Muscle Exposed: No Joint Exposed: No Bone Exposed: No ion in Area: 100% ion in Volume: 100% alization: None ng: No ning: No Electronic Signature(s) Signed: 09/03/2019 5:39:56 PM By: [name] RN Signed: 09/03/2019 5:57:12 PM By: Kela Millin Entered By: Kela Millin on 09/03/2019 11:58:24 -------------------------------------------------------------------------------- Vitals Details Patient Name: Date of Service: Tonya Hunter. 09/03/2019 11:00 AM Medical Record [id number] Patient Account Number: [id number] Date of Birth/Sex: Treating RN: [birth year] (85 y.o. [name] Primary Care Keagan Anthis: [name] Other Clinician: Referring Pualani Borah: Treating Juanell Saffo/Extender:[name], [name], [name] in Treatment: 14 Vital Signs Time Taken: 11:44 Temperature (F): 98.1 Height (in): 66 Pulse (bpm): 73 Weight (lbs): 164 Respiratory Rate (breaths/min): 18 Body Mass Index (BMI): 26.5 Blood Pressure (mmHg): 119/63 Reference Range: 80 - 120 mg / dl Electronic Signature(s) Signed: 09/03/2019 1:29:28 PM By: Sandre Kitty Entered By: Sandre Kitty on 09/03/2019 11:44:49

## 2019-09-06 DIAGNOSIS — D649 Anemia, unspecified: Secondary | ICD-10-CM | POA: Diagnosis not present

## 2019-09-07 ENCOUNTER — Encounter (HOSPITAL_COMMUNITY): Payer: Self-pay | Admitting: Pulmonary Disease

## 2019-09-07 ENCOUNTER — Emergency Department (HOSPITAL_COMMUNITY): Payer: Medicare Other

## 2019-09-07 ENCOUNTER — Inpatient Hospital Stay (HOSPITAL_COMMUNITY): Payer: Medicare Other

## 2019-09-07 ENCOUNTER — Inpatient Hospital Stay (HOSPITAL_COMMUNITY)
Admission: EM | Admit: 2019-09-07 | Discharge: 2019-09-24 | DRG: 463 | Disposition: A | Discharge status: Rehabilitation Facility | Payer: Medicare Other | Attending: Internal Medicine | Admitting: Internal Medicine

## 2019-09-07 DIAGNOSIS — M9711XA Periprosthetic fracture around internal prosthetic right knee joint, initial encounter: Secondary | ICD-10-CM | POA: Diagnosis not present

## 2019-09-07 DIAGNOSIS — I5042 Chronic combined systolic (congestive) and diastolic (congestive) heart failure: Secondary | ICD-10-CM | POA: Diagnosis present

## 2019-09-07 DIAGNOSIS — S199XXA Unspecified injury of neck, initial encounter: Secondary | ICD-10-CM | POA: Diagnosis not present

## 2019-09-07 DIAGNOSIS — I10 Essential (primary) hypertension: Secondary | ICD-10-CM | POA: Diagnosis not present

## 2019-09-07 DIAGNOSIS — E872 Acidosis: Secondary | ICD-10-CM | POA: Diagnosis present

## 2019-09-07 DIAGNOSIS — D72819 Decreased white blood cell count, unspecified: Secondary | ICD-10-CM | POA: Diagnosis not present

## 2019-09-07 DIAGNOSIS — L97519 Non-pressure chronic ulcer of other part of right foot with unspecified severity: Secondary | ICD-10-CM | POA: Diagnosis present

## 2019-09-07 DIAGNOSIS — D684 Acquired coagulation factor deficiency: Secondary | ICD-10-CM | POA: Diagnosis present

## 2019-09-07 DIAGNOSIS — E119 Type 2 diabetes mellitus without complications: Secondary | ICD-10-CM

## 2019-09-07 DIAGNOSIS — Z86718 Personal history of other venous thrombosis and embolism: Secondary | ICD-10-CM | POA: Diagnosis not present

## 2019-09-07 DIAGNOSIS — R0902 Hypoxemia: Secondary | ICD-10-CM | POA: Diagnosis present

## 2019-09-07 DIAGNOSIS — E113593 Type 2 diabetes mellitus with proliferative diabetic retinopathy without macular edema, bilateral: Secondary | ICD-10-CM | POA: Diagnosis present

## 2019-09-07 DIAGNOSIS — I82409 Acute embolism and thrombosis of unspecified deep veins of unspecified lower extremity: Secondary | ICD-10-CM

## 2019-09-07 DIAGNOSIS — I8510 Secondary esophageal varices without bleeding: Secondary | ICD-10-CM | POA: Diagnosis present

## 2019-09-07 DIAGNOSIS — K7460 Unspecified cirrhosis of liver: Secondary | ICD-10-CM | POA: Diagnosis present

## 2019-09-07 DIAGNOSIS — Z7982 Long term (current) use of aspirin: Secondary | ICD-10-CM | POA: Diagnosis not present

## 2019-09-07 DIAGNOSIS — Z20822 Contact with and (suspected) exposure to covid-19: Secondary | ICD-10-CM | POA: Diagnosis not present

## 2019-09-07 DIAGNOSIS — I350 Nonrheumatic aortic (valve) stenosis: Secondary | ICD-10-CM

## 2019-09-07 DIAGNOSIS — K7581 Nonalcoholic steatohepatitis (NASH): Secondary | ICD-10-CM | POA: Diagnosis not present

## 2019-09-07 DIAGNOSIS — Z8673 Personal history of transient ischemic attack (TIA), and cerebral infarction without residual deficits: Secondary | ICD-10-CM | POA: Diagnosis not present

## 2019-09-07 DIAGNOSIS — Z95828 Presence of other vascular implants and grafts: Secondary | ICD-10-CM | POA: Diagnosis not present

## 2019-09-07 DIAGNOSIS — S3993XA Unspecified injury of pelvis, initial encounter: Secondary | ICD-10-CM | POA: Diagnosis not present

## 2019-09-07 DIAGNOSIS — N179 Acute kidney failure, unspecified: Secondary | ICD-10-CM | POA: Diagnosis not present

## 2019-09-07 DIAGNOSIS — S0990XA Unspecified injury of head, initial encounter: Secondary | ICD-10-CM | POA: Diagnosis not present

## 2019-09-07 DIAGNOSIS — I82453 Acute embolism and thrombosis of peroneal vein, bilateral: Secondary | ICD-10-CM | POA: Diagnosis present

## 2019-09-07 DIAGNOSIS — R578 Other shock: Secondary | ICD-10-CM | POA: Diagnosis present

## 2019-09-07 DIAGNOSIS — S2220XA Unspecified fracture of sternum, initial encounter for closed fracture: Secondary | ICD-10-CM | POA: Diagnosis present

## 2019-09-07 DIAGNOSIS — E039 Hypothyroidism, unspecified: Secondary | ICD-10-CM | POA: Diagnosis present

## 2019-09-07 DIAGNOSIS — I110 Hypertensive heart disease with heart failure: Secondary | ICD-10-CM | POA: Diagnosis present

## 2019-09-07 DIAGNOSIS — L89322 Pressure ulcer of left buttock, stage 2: Secondary | ICD-10-CM | POA: Diagnosis present

## 2019-09-07 DIAGNOSIS — R140 Abdominal distension (gaseous): Secondary | ICD-10-CM

## 2019-09-07 DIAGNOSIS — I82432 Acute embolism and thrombosis of left popliteal vein: Secondary | ICD-10-CM | POA: Diagnosis present

## 2019-09-07 DIAGNOSIS — D61818 Other pancytopenia: Secondary | ICD-10-CM | POA: Diagnosis present

## 2019-09-07 DIAGNOSIS — Z7984 Long term (current) use of oral hypoglycemic drugs: Secondary | ICD-10-CM | POA: Diagnosis not present

## 2019-09-07 DIAGNOSIS — Z96653 Presence of artificial knee joint, bilateral: Secondary | ICD-10-CM | POA: Diagnosis present

## 2019-09-07 DIAGNOSIS — E861 Hypovolemia: Secondary | ICD-10-CM | POA: Diagnosis not present

## 2019-09-07 DIAGNOSIS — I252 Old myocardial infarction: Secondary | ICD-10-CM

## 2019-09-07 DIAGNOSIS — Z880 Allergy status to penicillin: Secondary | ICD-10-CM

## 2019-09-07 DIAGNOSIS — L89153 Pressure ulcer of sacral region, stage 3: Secondary | ICD-10-CM | POA: Diagnosis present

## 2019-09-07 DIAGNOSIS — D62 Acute posthemorrhagic anemia: Secondary | ICD-10-CM | POA: Diagnosis present

## 2019-09-07 DIAGNOSIS — M1990 Unspecified osteoarthritis, unspecified site: Secondary | ICD-10-CM | POA: Diagnosis present

## 2019-09-07 DIAGNOSIS — S99921A Unspecified injury of right foot, initial encounter: Secondary | ICD-10-CM | POA: Diagnosis not present

## 2019-09-07 DIAGNOSIS — Z955 Presence of coronary angioplasty implant and graft: Secondary | ICD-10-CM

## 2019-09-07 DIAGNOSIS — I82442 Acute embolism and thrombosis of left tibial vein: Secondary | ICD-10-CM | POA: Diagnosis present

## 2019-09-07 DIAGNOSIS — I509 Heart failure, unspecified: Secondary | ICD-10-CM

## 2019-09-07 DIAGNOSIS — S2249XA Multiple fractures of ribs, unspecified side, initial encounter for closed fracture: Secondary | ICD-10-CM | POA: Diagnosis not present

## 2019-09-07 DIAGNOSIS — S2243XA Multiple fractures of ribs, bilateral, initial encounter for closed fracture: Secondary | ICD-10-CM | POA: Diagnosis present

## 2019-09-07 DIAGNOSIS — R188 Other ascites: Secondary | ICD-10-CM | POA: Diagnosis not present

## 2019-09-07 DIAGNOSIS — S2243XD Multiple fractures of ribs, bilateral, subsequent encounter for fracture with routine healing: Secondary | ICD-10-CM | POA: Diagnosis not present

## 2019-09-07 DIAGNOSIS — E11621 Type 2 diabetes mellitus with foot ulcer: Secondary | ICD-10-CM | POA: Diagnosis present

## 2019-09-07 DIAGNOSIS — Z419 Encounter for procedure for purposes other than remedying health state, unspecified: Secondary | ICD-10-CM

## 2019-09-07 DIAGNOSIS — S72491S Other fracture of lower end of right femur, sequela: Secondary | ICD-10-CM | POA: Diagnosis not present

## 2019-09-07 DIAGNOSIS — E871 Hypo-osmolality and hyponatremia: Secondary | ICD-10-CM | POA: Diagnosis not present

## 2019-09-07 DIAGNOSIS — M79671 Pain in right foot: Secondary | ICD-10-CM | POA: Diagnosis not present

## 2019-09-07 DIAGNOSIS — Z515 Encounter for palliative care: Secondary | ICD-10-CM | POA: Diagnosis not present

## 2019-09-07 DIAGNOSIS — T148XXA Other injury of unspecified body region, initial encounter: Secondary | ICD-10-CM

## 2019-09-07 DIAGNOSIS — M50321 Other cervical disc degeneration at C4-C5 level: Secondary | ICD-10-CM | POA: Diagnosis present

## 2019-09-07 DIAGNOSIS — S72401A Unspecified fracture of lower end of right femur, initial encounter for closed fracture: Secondary | ICD-10-CM | POA: Diagnosis not present

## 2019-09-07 DIAGNOSIS — O2230 Deep phlebothrombosis in pregnancy, unspecified trimester: Secondary | ICD-10-CM

## 2019-09-07 DIAGNOSIS — E875 Hyperkalemia: Secondary | ICD-10-CM | POA: Diagnosis not present

## 2019-09-07 DIAGNOSIS — S72491D Other fracture of lower end of right femur, subsequent encounter for closed fracture with routine healing: Secondary | ICD-10-CM | POA: Diagnosis not present

## 2019-09-07 DIAGNOSIS — R110 Nausea: Secondary | ICD-10-CM | POA: Diagnosis not present

## 2019-09-07 DIAGNOSIS — S301XXA Contusion of abdominal wall, initial encounter: Secondary | ICD-10-CM | POA: Diagnosis present

## 2019-09-07 DIAGNOSIS — S82141D Displaced bicondylar fracture of right tibia, subsequent encounter for closed fracture with routine healing: Secondary | ICD-10-CM | POA: Diagnosis not present

## 2019-09-07 DIAGNOSIS — I959 Hypotension, unspecified: Secondary | ICD-10-CM | POA: Diagnosis not present

## 2019-09-07 DIAGNOSIS — S72451A Displaced supracondylar fracture without intracondylar extension of lower end of right femur, initial encounter for closed fracture: Secondary | ICD-10-CM | POA: Diagnosis present

## 2019-09-07 DIAGNOSIS — T1490XA Injury, unspecified, initial encounter: Secondary | ICD-10-CM | POA: Diagnosis not present

## 2019-09-07 DIAGNOSIS — S82101A Unspecified fracture of upper end of right tibia, initial encounter for closed fracture: Secondary | ICD-10-CM | POA: Diagnosis not present

## 2019-09-07 DIAGNOSIS — E118 Type 2 diabetes mellitus with unspecified complications: Secondary | ICD-10-CM | POA: Diagnosis not present

## 2019-09-07 DIAGNOSIS — R339 Retention of urine, unspecified: Secondary | ICD-10-CM | POA: Diagnosis not present

## 2019-09-07 DIAGNOSIS — S72421A Displaced fracture of lateral condyle of right femur, initial encounter for closed fracture: Secondary | ICD-10-CM

## 2019-09-07 DIAGNOSIS — S82251D Displaced comminuted fracture of shaft of right tibia, subsequent encounter for closed fracture with routine healing: Secondary | ICD-10-CM | POA: Diagnosis not present

## 2019-09-07 DIAGNOSIS — L89150 Pressure ulcer of sacral region, unstageable: Secondary | ICD-10-CM | POA: Diagnosis present

## 2019-09-07 DIAGNOSIS — Z79890 Hormone replacement therapy (postmenopausal): Secondary | ICD-10-CM | POA: Diagnosis not present

## 2019-09-07 DIAGNOSIS — E785 Hyperlipidemia, unspecified: Secondary | ICD-10-CM | POA: Diagnosis present

## 2019-09-07 DIAGNOSIS — R571 Hypovolemic shock: Secondary | ICD-10-CM | POA: Diagnosis present

## 2019-09-07 DIAGNOSIS — R52 Pain, unspecified: Secondary | ICD-10-CM | POA: Diagnosis not present

## 2019-09-07 DIAGNOSIS — I82462 Acute embolism and thrombosis of left calf muscular vein: Secondary | ICD-10-CM | POA: Diagnosis present

## 2019-09-07 DIAGNOSIS — Z66 Do not resuscitate: Secondary | ICD-10-CM | POA: Diagnosis present

## 2019-09-07 DIAGNOSIS — S2220XD Unspecified fracture of sternum, subsequent encounter for fracture with routine healing: Secondary | ICD-10-CM | POA: Diagnosis not present

## 2019-09-07 DIAGNOSIS — Y92481 Parking lot as the place of occurrence of the external cause: Secondary | ICD-10-CM

## 2019-09-07 DIAGNOSIS — R1110 Vomiting, unspecified: Secondary | ICD-10-CM | POA: Diagnosis not present

## 2019-09-07 DIAGNOSIS — K5900 Constipation, unspecified: Secondary | ICD-10-CM | POA: Diagnosis not present

## 2019-09-07 DIAGNOSIS — S61412A Laceration without foreign body of left hand, initial encounter: Secondary | ICD-10-CM | POA: Diagnosis present

## 2019-09-07 DIAGNOSIS — K7469 Other cirrhosis of liver: Secondary | ICD-10-CM

## 2019-09-07 DIAGNOSIS — Z7189 Other specified counseling: Secondary | ICD-10-CM | POA: Diagnosis not present

## 2019-09-07 DIAGNOSIS — M79642 Pain in left hand: Secondary | ICD-10-CM | POA: Diagnosis not present

## 2019-09-07 DIAGNOSIS — S72411A Displaced unspecified condyle fracture of lower end of right femur, initial encounter for closed fracture: Secondary | ICD-10-CM | POA: Diagnosis not present

## 2019-09-07 DIAGNOSIS — R579 Shock, unspecified: Secondary | ICD-10-CM | POA: Diagnosis not present

## 2019-09-07 DIAGNOSIS — I2510 Atherosclerotic heart disease of native coronary artery without angina pectoris: Secondary | ICD-10-CM | POA: Diagnosis present

## 2019-09-07 DIAGNOSIS — S72401D Unspecified fracture of lower end of right femur, subsequent encounter for closed fracture with routine healing: Secondary | ICD-10-CM | POA: Diagnosis not present

## 2019-09-07 DIAGNOSIS — R609 Edema, unspecified: Secondary | ICD-10-CM | POA: Diagnosis not present

## 2019-09-07 DIAGNOSIS — L97509 Non-pressure chronic ulcer of other part of unspecified foot with unspecified severity: Secondary | ICD-10-CM | POA: Diagnosis present

## 2019-09-07 DIAGNOSIS — S299XXA Unspecified injury of thorax, initial encounter: Secondary | ICD-10-CM | POA: Diagnosis not present

## 2019-09-07 DIAGNOSIS — S82201A Unspecified fracture of shaft of right tibia, initial encounter for closed fracture: Secondary | ICD-10-CM | POA: Diagnosis present

## 2019-09-07 DIAGNOSIS — Z7902 Long term (current) use of antithrombotics/antiplatelets: Secondary | ICD-10-CM | POA: Diagnosis not present

## 2019-09-07 DIAGNOSIS — Z85828 Personal history of other malignant neoplasm of skin: Secondary | ICD-10-CM

## 2019-09-07 DIAGNOSIS — L97518 Non-pressure chronic ulcer of other part of right foot with other specified severity: Secondary | ICD-10-CM | POA: Diagnosis not present

## 2019-09-07 DIAGNOSIS — K5790 Diverticulosis of intestine, part unspecified, without perforation or abscess without bleeding: Secondary | ICD-10-CM | POA: Diagnosis not present

## 2019-09-07 DIAGNOSIS — E1169 Type 2 diabetes mellitus with other specified complication: Secondary | ICD-10-CM | POA: Diagnosis not present

## 2019-09-07 DIAGNOSIS — S6992XA Unspecified injury of left wrist, hand and finger(s), initial encounter: Secondary | ICD-10-CM | POA: Diagnosis not present

## 2019-09-07 DIAGNOSIS — T07XXXA Unspecified multiple injuries, initial encounter: Secondary | ICD-10-CM | POA: Diagnosis not present

## 2019-09-07 DIAGNOSIS — S72401S Unspecified fracture of lower end of right femur, sequela: Secondary | ICD-10-CM | POA: Diagnosis not present

## 2019-09-07 DIAGNOSIS — I824Z3 Acute embolism and thrombosis of unspecified deep veins of distal lower extremity, bilateral: Secondary | ICD-10-CM | POA: Diagnosis not present

## 2019-09-07 DIAGNOSIS — R0789 Other chest pain: Secondary | ICD-10-CM | POA: Diagnosis present

## 2019-09-07 DIAGNOSIS — Z79899 Other long term (current) drug therapy: Secondary | ICD-10-CM | POA: Diagnosis not present

## 2019-09-07 DIAGNOSIS — E1165 Type 2 diabetes mellitus with hyperglycemia: Secondary | ICD-10-CM | POA: Diagnosis not present

## 2019-09-07 HISTORY — DX: Type 2 diabetes mellitus with proliferative diabetic retinopathy without macular edema, bilateral: E11.3593

## 2019-09-07 HISTORY — DX: Postconcussional syndrome: F07.81

## 2019-09-07 HISTORY — DX: Foot drop, left foot: M21.372

## 2019-09-07 HISTORY — DX: Type 2 diabetes mellitus with unspecified complications: E11.8

## 2019-09-07 HISTORY — DX: Unspecified macular degeneration: H35.30

## 2019-09-07 HISTORY — DX: Atherosclerotic heart disease of native coronary artery without angina pectoris: I25.10

## 2019-09-07 LAB — DIC (DISSEMINATED INTRAVASCULAR COAGULATION)PANEL
D-Dimer, Quant: >20 ug/mL-FEU — ABNORMAL HIGH (ref 0.00–0.50)
Fibrinogen: 223 mg/dL (ref 210–475)
INR: 1.3 — ABNORMAL HIGH (ref 0.8–1.2)
Platelets: 214 10*3/uL (ref 150–400)
Prothrombin Time: 16.2 seconds — ABNORMAL HIGH (ref 11.4–15.2)
Smear Review: NONE SEEN
aPTT: 35 seconds (ref 24–36)

## 2019-09-07 LAB — PROTIME-INR
INR: 1.4 — ABNORMAL HIGH (ref 0.8–1.2)
INR: 1.3 — ABNORMAL HIGH (ref 0.8–1.2)
Prothrombin Time: 16.8 seconds — ABNORMAL HIGH (ref 11.4–15.2)
Prothrombin Time: 16 seconds — ABNORMAL HIGH (ref 11.4–15.2)

## 2019-09-07 LAB — CBC
HCT: 35.2 % — ABNORMAL LOW (ref 36.0–46.0)
HCT: 29.8 % — ABNORMAL LOW (ref 36.0–46.0)
Hemoglobin: 11.1 g/dL — ABNORMAL LOW (ref 12.0–15.0)
Hemoglobin: 9.8 g/dL — ABNORMAL LOW (ref 12.0–15.0)
MCH: 30 pg (ref 26.0–34.0)
MCH: 30.2 pg (ref 26.0–34.0)
MCHC: 31.5 g/dL (ref 30.0–36.0)
MCHC: 32.9 g/dL (ref 30.0–36.0)
MCV: 95.1 fL (ref 80.0–100.0)
MCV: 91.7 fL (ref 80.0–100.0)
Platelets: 117 10*3/uL — ABNORMAL LOW (ref 150–400)
Platelets: 170 10*3/uL (ref 150–400)
RBC: 3.7 MIL/uL — ABNORMAL LOW (ref 3.87–5.11)
RBC: 3.25 MIL/uL — ABNORMAL LOW (ref 3.87–5.11)
RDW: 13.9 % (ref 11.5–15.5)
RDW: 14 % (ref 11.5–15.5)
WBC: 5 10*3/uL (ref 4.0–10.5)
WBC: 10.3 10*3/uL (ref 4.0–10.5)
nRBC: 0 % (ref 0.0–0.2)
nRBC: 0 % (ref 0.0–0.2)

## 2019-09-07 LAB — I-STAT CHEM 8, ED
BUN: 21 mg/dL (ref 8–23)
Calcium, Ion: 0.96 mmol/L — ABNORMAL LOW (ref 1.15–1.40)
Chloride: 101 mmol/L (ref 98–111)
Creatinine, Ser: 0.8 mg/dL (ref 0.44–1.00)
Glucose, Bld: 436 mg/dL — ABNORMAL HIGH (ref 70–99)
HCT: 33 % — ABNORMAL LOW (ref 36.0–46.0)
Hemoglobin: 11.2 g/dL — ABNORMAL LOW (ref 12.0–15.0)
Potassium: 4.5 mmol/L (ref 3.5–5.1)
Sodium: 131 mmol/L — ABNORMAL LOW (ref 135–145)
TCO2: 18 mmol/L — ABNORMAL LOW (ref 22–32)

## 2019-09-07 LAB — RESPIRATORY PANEL BY RT PCR (FLU A&B, COVID)
Influenza A by PCR: NEGATIVE
Influenza B by PCR: NEGATIVE
SARS Coronavirus 2 by RT PCR: NEGATIVE

## 2019-09-07 LAB — COMPREHENSIVE METABOLIC PANEL
ALT: 31 U/L (ref 0–44)
AST: 53 U/L — ABNORMAL HIGH (ref 15–41)
Albumin: 2.3 g/dL — ABNORMAL LOW (ref 3.5–5.0)
Alkaline Phosphatase: 184 U/L — ABNORMAL HIGH (ref 38–126)
Anion gap: 11 (ref 5–15)
BUN: 19 mg/dL (ref 8–23)
CO2: 17 mmol/L — ABNORMAL LOW (ref 22–32)
Calcium: 7.9 mg/dL — ABNORMAL LOW (ref 8.9–10.3)
Chloride: 106 mmol/L (ref 98–111)
Creatinine, Ser: 0.93 mg/dL (ref 0.44–1.00)
GFR calc Af Amer: >60 mL/min (ref >60)
GFR calc non Af Amer: 57 mL/min — ABNORMAL LOW (ref >60)
Glucose, Bld: 424 mg/dL — ABNORMAL HIGH (ref 70–99)
Potassium: 4.3 mmol/L (ref 3.5–5.1)
Sodium: 134 mmol/L — ABNORMAL LOW (ref 135–145)
Total Bilirubin: 0.8 mg/dL (ref 0.3–1.2)
Total Protein: 5.1 g/dL — ABNORMAL LOW (ref 6.5–8.1)

## 2019-09-07 LAB — LACTIC ACID, PLASMA
Lactic Acid, Venous: 5.5 mmol/L (ref 0.5–1.9)
Lactic Acid, Venous: 5.6 mmol/L (ref 0.5–1.9)

## 2019-09-07 LAB — CORTISOL: Cortisol, Plasma: 37.1 ug/dL

## 2019-09-07 LAB — CBG MONITORING, ED
Glucose-Capillary: 292 mg/dL — ABNORMAL HIGH (ref 70–99)
Glucose-Capillary: 325 mg/dL — ABNORMAL HIGH (ref 70–99)
Glucose-Capillary: 337 mg/dL — ABNORMAL HIGH (ref 70–99)
Glucose-Capillary: 354 mg/dL — ABNORMAL HIGH (ref 70–99)
Glucose-Capillary: 368 mg/dL — ABNORMAL HIGH (ref 70–99)

## 2019-09-07 LAB — HEMOGLOBIN A1C
Hgb A1c MFr Bld: 9.2 % — ABNORMAL HIGH (ref 4.8–5.6)
Mean Plasma Glucose: 217.34 mg/dL

## 2019-09-07 LAB — ECHOCARDIOGRAM COMPLETE
Height: 67 in
Weight: 3040 oz

## 2019-09-07 LAB — ETHANOL: Alcohol, Ethyl (B): <10 mg/dL (ref <10)

## 2019-09-07 LAB — BRAIN NATRIURETIC PEPTIDE: B Natriuretic Peptide: 82.2 pg/mL (ref 0.0–100.0)

## 2019-09-07 LAB — ABO/RH: ABO/RH(D): O POS

## 2019-09-07 LAB — AMYLASE: Amylase: 26 U/L — ABNORMAL LOW (ref 28–100)

## 2019-09-07 LAB — PHOSPHORUS: Phosphorus: 3.3 mg/dL (ref 2.5–4.6)

## 2019-09-07 LAB — TROPONIN I (HIGH SENSITIVITY): Troponin I (High Sensitivity): 361 ng/L (ref <18)

## 2019-09-07 LAB — PROCALCITONIN: Procalcitonin: <0.1 ng/mL

## 2019-09-07 LAB — MAGNESIUM: Magnesium: 1.1 mg/dL — ABNORMAL LOW (ref 1.7–2.4)

## 2019-09-07 LAB — AMMONIA: Ammonia: 88 umol/L — ABNORMAL HIGH (ref 9–35)

## 2019-09-07 MED ORDER — NOREPINEPHRINE 4 MG/250ML-% IV SOLN
2.0000 ug/min | INTRAVENOUS | Status: DC
  Administered 2019-09-07: 18:00:00 4 ug/min via INTRAVENOUS
  Administered 2019-09-08: 10 ug/min via INTRAVENOUS
  Filled 2019-09-07 (×2): qty 250

## 2019-09-07 MED ORDER — FENTANYL CITRATE (PF) 100 MCG/2ML IJ SOLN
INTRAMUSCULAR | Status: AC | PRN
  Administered 2019-09-07: 12.5 ug via INTRAVENOUS

## 2019-09-07 MED ORDER — ONDANSETRON HCL 4 MG/2ML IJ SOLN: 4.0000 mg | Freq: Once | INTRAMUSCULAR | Status: DC

## 2019-09-07 MED ORDER — POLYETHYLENE GLYCOL 3350 17 G PO PACK: 17.0000 g | PACK | Freq: Every day | ORAL | Status: DC | PRN

## 2019-09-07 MED ORDER — FENTANYL CITRATE (PF) 100 MCG/2ML IJ SOLN
INTRAMUSCULAR | Status: AC
  Filled 2019-09-07: qty 2

## 2019-09-07 MED ORDER — MAGNESIUM SULFATE 2 GM/50ML IV SOLN
2.0000 g | Freq: Once | INTRAVENOUS | Status: AC
  Administered 2019-09-07: 2 g via INTRAVENOUS
  Filled 2019-09-07: qty 50

## 2019-09-07 MED ORDER — SODIUM CHLORIDE 0.9 % IV SOLN
250.0000 mL | INTRAVENOUS | Status: DC
  Administered 2019-09-08 (×2): 250 mL via INTRAVENOUS

## 2019-09-07 MED ORDER — ONDANSETRON HCL 4 MG/2ML IJ SOLN
4.0000 mg | Freq: Four times a day (QID) | INTRAMUSCULAR | Status: DC | PRN
  Administered 2019-09-07 – 2019-09-22 (×4): 4 mg via INTRAVENOUS
  Filled 2019-09-07 (×4): qty 2

## 2019-09-07 MED ORDER — LEVOTHYROXINE SODIUM 100 MCG/5ML IV SOLN
68.6000 ug | Freq: Every day | INTRAVENOUS | Status: DC
  Administered 2019-09-08 – 2019-09-09 (×2): 68.6 ug via INTRAVENOUS
  Filled 2019-09-07 (×3): qty 5

## 2019-09-07 MED ORDER — DEXTROSE 50 % IV SOLN: 0.0000 mL | INTRAVENOUS | Status: DC | PRN

## 2019-09-07 MED ORDER — NOREPINEPHRINE 4 MG/250ML-% IV SOLN
INTRAVENOUS | Status: AC
  Filled 2019-09-07: qty 250

## 2019-09-07 MED ORDER — IOHEXOL 300 MG/ML  SOLN
100.0000 mL | Freq: Once | INTRAMUSCULAR | Status: AC | PRN
  Administered 2019-09-07: 100 mL via INTRAVENOUS

## 2019-09-07 MED ORDER — VANCOMYCIN HCL 1750 MG/350ML IV SOLN
1750.0000 mg | Freq: Once | INTRAVENOUS | Status: AC
  Administered 2019-09-07: 23:00:00 1750 mg via INTRAVENOUS
  Filled 2019-09-07 (×2): qty 350

## 2019-09-07 MED ORDER — LIDOCAINE HCL 2 % IJ SOLN
10.0000 mL | Freq: Once | INTRAMUSCULAR | Status: DC
  Filled 2019-09-07: qty 20

## 2019-09-07 MED ORDER — VANCOMYCIN HCL 1250 MG/250ML IV SOLN
1250.0000 mg | INTRAVENOUS | Status: DC
  Administered 2019-09-08: 20:00:00 1250 mg via INTRAVENOUS
  Filled 2019-09-07: qty 250

## 2019-09-07 MED ORDER — SODIUM CHLORIDE 0.9 % IV SOLN
INTRAVENOUS | Status: AC | PRN
  Administered 2019-09-07 (×2): 1000 mL via INTRAVENOUS

## 2019-09-07 MED ORDER — DEXTROSE-NACL 5-0.45 % IV SOLN: INTRAVENOUS | Status: DC

## 2019-09-07 MED ORDER — INSULIN ASPART 100 UNIT/ML ~~LOC~~ SOLN
0.0000 [IU] | SUBCUTANEOUS | Status: DC
  Administered 2019-09-07: 18:00:00 15 [IU] via SUBCUTANEOUS

## 2019-09-07 MED ORDER — INSULIN REGULAR(HUMAN) IN NACL 100-0.9 UT/100ML-% IV SOLN
INTRAVENOUS | Status: DC
  Administered 2019-09-07: 22:00:00 15 [IU]/h via INTRAVENOUS
  Administered 2019-09-08: 2.2 [IU]/h via INTRAVENOUS
  Filled 2019-09-07 (×2): qty 100

## 2019-09-07 MED ORDER — SODIUM CHLORIDE 0.9 % IV SOLN: INTRAVENOUS | Status: DC

## 2019-09-07 MED ORDER — DOCUSATE SODIUM 100 MG PO CAPS: 100.0000 mg | ORAL_CAPSULE | Freq: Two times a day (BID) | ORAL | Status: DC | PRN

## 2019-09-07 MED ORDER — SODIUM CHLORIDE 0.9% IV SOLUTION: Freq: Once | INTRAVENOUS | Status: DC

## 2019-09-07 MED ORDER — SODIUM CHLORIDE 0.9 % IV SOLN
2.0000 g | INTRAVENOUS | Status: DC
  Administered 2019-09-07 – 2019-09-08 (×2): 2 g via INTRAVENOUS
  Filled 2019-09-07 (×2): qty 20
  Filled 2019-09-07: qty 2

## 2019-09-07 MED ORDER — FAMOTIDINE IN NACL 20-0.9 MG/50ML-% IV SOLN
20.0000 mg | Freq: Two times a day (BID) | INTRAVENOUS | Status: DC
  Administered 2019-09-08 – 2019-09-09 (×3): 20 mg via INTRAVENOUS
  Filled 2019-09-07 (×3): qty 50

## 2019-09-07 NOTE — Consult Note (Signed)
Responded to page, then later to request to move pt's husband to consult room for visit with doctor. But doctor wished pt's husband to remain in [place] [facility], where they visited before, so provided pt's husband with refreshment while he waits there till doctor comes again with further news of pt's condition. Provided spiritual/emotional support and prayer, which pt's husband appreciated. Family is [place].  Rev. Eloise Levels Chaplain

## 2019-09-07 NOTE — ED Notes (Signed)
Pt to CT now
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
## 2019-09-07 NOTE — Progress Notes (Signed)
Echo arrived at 19:20, waiting for MD to finish suturing on left hand.

## 2019-09-07 NOTE — ED Notes (Signed)
No change in insulin gtt per [facility]
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
## 2019-09-07 NOTE — ED Notes (Signed)
Pt's spouse at bedside

## 2019-09-07 NOTE — ED Notes (Signed)
Pelvic binder applied
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
## 2019-09-07 NOTE — Progress Notes (Signed)
Echocardiogram 2D Echocardiogram has been performed.  Oneal Deputy Lorain Fettes 09/07/2019, 8:02 PM   Notified Dr. Margaretann Loveless of stat
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
## 2019-09-07 NOTE — ED Notes (Signed)
Back from CT

## 2019-09-08 ENCOUNTER — Inpatient Hospital Stay (HOSPITAL_COMMUNITY): Payer: Medicare Other

## 2019-09-08 ENCOUNTER — Encounter (HOSPITAL_COMMUNITY): Payer: Medicare Other

## 2019-09-08 DIAGNOSIS — R579 Shock, unspecified: Secondary | ICD-10-CM | POA: Diagnosis not present

## 2019-09-08 LAB — BPAM FFP
Blood Product Expiration Date: 202104272359
Blood Product Expiration Date: 202104272359
ISSUE DATE / TIME: 202104241608
ISSUE DATE / TIME: 202104241608
Unit Type and Rh: 6200
Unit Type and Rh: 6200

## 2019-09-08 LAB — PREPARE PLATELET PHERESIS
Unit division: 0
Unit division: 0

## 2019-09-08 LAB — CBC
HCT: 29.9 % — ABNORMAL LOW (ref 36.0–46.0)
HCT: 26 % — ABNORMAL LOW (ref 36.0–46.0)
HCT: 21.8 % — ABNORMAL LOW (ref 36.0–46.0)
Hemoglobin: 9.8 g/dL — ABNORMAL LOW (ref 12.0–15.0)
Hemoglobin: 8.7 g/dL — ABNORMAL LOW (ref 12.0–15.0)
Hemoglobin: 7.4 g/dL — ABNORMAL LOW (ref 12.0–15.0)
MCH: 30 pg (ref 26.0–34.0)
MCH: 29.3 pg (ref 26.0–34.0)
MCH: 30 pg (ref 26.0–34.0)
MCHC: 32.8 g/dL (ref 30.0–36.0)
MCHC: 33.5 g/dL (ref 30.0–36.0)
MCHC: 33.9 g/dL (ref 30.0–36.0)
MCV: 91.4 fL (ref 80.0–100.0)
MCV: 87.5 fL (ref 80.0–100.0)
MCV: 88.3 fL (ref 80.0–100.0)
Platelets: 214 10*3/uL (ref 150–400)
Platelets: 163 10*3/uL (ref 150–400)
Platelets: 92 10*3/uL — ABNORMAL LOW (ref 150–400)
RBC: 3.27 MIL/uL — ABNORMAL LOW (ref 3.87–5.11)
RBC: 2.97 MIL/uL — ABNORMAL LOW (ref 3.87–5.11)
RBC: 2.47 MIL/uL — ABNORMAL LOW (ref 3.87–5.11)
RDW: 14.7 % (ref 11.5–15.5)
RDW: 14.8 % (ref 11.5–15.5)
RDW: 14.8 % (ref 11.5–15.5)
WBC: 12.9 10*3/uL — ABNORMAL HIGH (ref 4.0–10.5)
WBC: 8.9 10*3/uL (ref 4.0–10.5)
WBC: 5.6 10*3/uL (ref 4.0–10.5)
nRBC: 0 % (ref 0.0–0.2)
nRBC: 0 % (ref 0.0–0.2)
nRBC: 0 % (ref 0.0–0.2)

## 2019-09-08 LAB — BASIC METABOLIC PANEL
Anion gap: 15 (ref 5–15)
BUN: 25 mg/dL — ABNORMAL HIGH (ref 8–23)
CO2: 17 mmol/L — ABNORMAL LOW (ref 22–32)
Calcium: 8.6 mg/dL — ABNORMAL LOW (ref 8.9–10.3)
Chloride: 105 mmol/L (ref 98–111)
Creatinine, Ser: 1.2 mg/dL — ABNORMAL HIGH (ref 0.44–1.00)
GFR calc Af Amer: 49 mL/min — ABNORMAL LOW (ref >60)
GFR calc non Af Amer: 42 mL/min — ABNORMAL LOW (ref >60)
Glucose, Bld: 214 mg/dL — ABNORMAL HIGH (ref 70–99)
Potassium: 4.3 mmol/L (ref 3.5–5.1)
Sodium: 137 mmol/L (ref 135–145)

## 2019-09-08 LAB — GLUCOSE, CAPILLARY
Glucose-Capillary: 121 mg/dL — ABNORMAL HIGH (ref 70–99)
Glucose-Capillary: 130 mg/dL — ABNORMAL HIGH (ref 70–99)
Glucose-Capillary: 131 mg/dL — ABNORMAL HIGH (ref 70–99)
Glucose-Capillary: 132 mg/dL — ABNORMAL HIGH (ref 70–99)
Glucose-Capillary: 139 mg/dL — ABNORMAL HIGH (ref 70–99)
Glucose-Capillary: 142 mg/dL — ABNORMAL HIGH (ref 70–99)
Glucose-Capillary: 143 mg/dL — ABNORMAL HIGH (ref 70–99)
Glucose-Capillary: 152 mg/dL — ABNORMAL HIGH (ref 70–99)
Glucose-Capillary: 156 mg/dL — ABNORMAL HIGH (ref 70–99)
Glucose-Capillary: 165 mg/dL — ABNORMAL HIGH (ref 70–99)
Glucose-Capillary: 170 mg/dL — ABNORMAL HIGH (ref 70–99)
Glucose-Capillary: 174 mg/dL — ABNORMAL HIGH (ref 70–99)
Glucose-Capillary: 194 mg/dL — ABNORMAL HIGH (ref 70–99)
Glucose-Capillary: 197 mg/dL — ABNORMAL HIGH (ref 70–99)
Glucose-Capillary: 214 mg/dL — ABNORMAL HIGH (ref 70–99)

## 2019-09-08 LAB — CBC WITH DIFFERENTIAL/PLATELET
Abs Immature Granulocytes: 0.06 10*3/uL (ref 0.00–0.07)
Basophils Absolute: 0 10*3/uL (ref 0.0–0.1)
Basophils Relative: 0 %
Eosinophils Absolute: 0 10*3/uL (ref 0.0–0.5)
Eosinophils Relative: 0 %
HCT: 29.4 % — ABNORMAL LOW (ref 36.0–46.0)
Hemoglobin: 9.6 g/dL — ABNORMAL LOW (ref 12.0–15.0)
Immature Granulocytes: 1 %
Lymphocytes Relative: 7 %
Lymphs Abs: 0.7 10*3/uL (ref 0.7–4.0)
MCH: 30 pg (ref 26.0–34.0)
MCHC: 32.7 g/dL (ref 30.0–36.0)
MCV: 91.9 fL (ref 80.0–100.0)
Monocytes Absolute: 0.6 10*3/uL (ref 0.1–1.0)
Monocytes Relative: 5 %
Neutro Abs: 9.4 10*3/uL — ABNORMAL HIGH (ref 1.7–7.7)
Neutrophils Relative %: 87 %
Platelets: 227 10*3/uL (ref 150–400)
RBC: 3.2 MIL/uL — ABNORMAL LOW (ref 3.87–5.11)
RDW: 14.6 % (ref 11.5–15.5)
WBC: 10.7 10*3/uL — ABNORMAL HIGH (ref 4.0–10.5)
nRBC: 0 % (ref 0.0–0.2)

## 2019-09-08 LAB — PREPARE FRESH FROZEN PLASMA

## 2019-09-08 LAB — URINALYSIS, ROUTINE W REFLEX MICROSCOPIC
Bilirubin Urine: NEGATIVE
Glucose, UA: >=500 mg/dL — AB
Hgb urine dipstick: NEGATIVE
Ketones, ur: NEGATIVE mg/dL
Leukocytes,Ua: NEGATIVE
Nitrite: NEGATIVE
Protein, ur: NEGATIVE mg/dL
Specific Gravity, Urine: 1.026 (ref 1.005–1.030)
pH: 5 (ref 5.0–8.0)

## 2019-09-08 LAB — BPAM PLATELET PHERESIS
Blood Product Expiration Date: 202104252359
Blood Product Expiration Date: 202104262359
ISSUE DATE / TIME: 202104241615
ISSUE DATE / TIME: 202104241615
Unit Type and Rh: 6200
Unit Type and Rh: 7300

## 2019-09-08 LAB — CK TOTAL AND CKMB (NOT AT ARMC)
CK, MB: 12.8 ng/mL — ABNORMAL HIGH (ref 0.5–5.0)
Relative Index: 2.5 (ref 0.0–2.5)
Total CK: 519 U/L — ABNORMAL HIGH (ref 38–234)

## 2019-09-08 LAB — LACTIC ACID, PLASMA
Lactic Acid, Venous: 5.5 mmol/L (ref 0.5–1.9)
Lactic Acid, Venous: 3.3 mmol/L (ref 0.5–1.9)

## 2019-09-08 LAB — TROPONIN I (HIGH SENSITIVITY): Troponin I (High Sensitivity): 472 ng/L (ref <18)

## 2019-09-08 LAB — PHOSPHORUS: Phosphorus: 3.8 mg/dL (ref 2.5–4.6)

## 2019-09-08 LAB — MAGNESIUM: Magnesium: 1.8 mg/dL (ref 1.7–2.4)

## 2019-09-08 MED ORDER — HYDROCODONE-ACETAMINOPHEN 7.5-325 MG PO TABS
1.0000 | ORAL_TABLET | Freq: Four times a day (QID) | ORAL | Status: DC | PRN
  Administered 2019-09-08 – 2019-09-09 (×3): 1 via ORAL
  Filled 2019-09-08 (×3): qty 1

## 2019-09-08 MED ORDER — LACTATED RINGERS IV BOLUS
500.0000 mL | Freq: Once | INTRAVENOUS | Status: AC
  Administered 2019-09-08: 14:00:00 500 mL via INTRAVENOUS

## 2019-09-08 MED ORDER — KETOROLAC TROMETHAMINE 15 MG/ML IJ SOLN
15.0000 mg | Freq: Four times a day (QID) | INTRAMUSCULAR | Status: DC | PRN
  Administered 2019-09-08: 15 mg via INTRAVENOUS
  Filled 2019-09-08: qty 1

## 2019-09-08 MED ORDER — INSULIN DETEMIR 100 UNIT/ML ~~LOC~~ SOLN
8.0000 [IU] | Freq: Two times a day (BID) | SUBCUTANEOUS | Status: DC
  Administered 2019-09-08 – 2019-09-09 (×3): 8 [IU] via SUBCUTANEOUS
  Filled 2019-09-08 (×4): qty 0.08

## 2019-09-08 MED ORDER — MAGNESIUM SULFATE 2 GM/50ML IV SOLN
2.0000 g | Freq: Once | INTRAVENOUS | Status: AC
  Administered 2019-09-08: 2 g via INTRAVENOUS
  Filled 2019-09-08: qty 50

## 2019-09-08 MED ORDER — ORAL CARE MOUTH RINSE
15.0000 mL | Freq: Two times a day (BID) | OROMUCOSAL | Status: DC
  Administered 2019-09-08 – 2019-09-24 (×32): 15 mL via OROMUCOSAL

## 2019-09-08 MED ORDER — CHLORHEXIDINE GLUCONATE CLOTH 2 % EX PADS
6.0000 | MEDICATED_PAD | Freq: Every day | CUTANEOUS | Status: DC
  Administered 2019-09-08 – 2019-09-24 (×16): 6 via TOPICAL

## 2019-09-08 MED ORDER — INSULIN ASPART 100 UNIT/ML ~~LOC~~ SOLN
2.0000 [IU] | SUBCUTANEOUS | Status: DC
  Administered 2019-09-08 (×2): 4 [IU] via SUBCUTANEOUS
  Administered 2019-09-09 (×2): 2 [IU] via SUBCUTANEOUS
  Administered 2019-09-09: 4 [IU] via SUBCUTANEOUS
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
## 2019-09-08 NOTE — Consult Note (Signed)
WOC Nurse Consult Note: Reason for Consult: right great toe wound Followed by the [facility] Wound type:Neuropathic foot ulceration; chronic Pressure Injury POA: NA Measurement:1.5cm x 2.0cm x 0.3cm  Wound bed:dry, pale, fibrinous aprox 20% Drainage (amount, consistency, odor) not able to assess no dressing on the patient Periwound:intact  Dressing procedure/placement/frequency: Silver hydrofiber; moist with small drops of sterile water (wound is dry), as antimicrobial. Top with dry dressing. Change every other day.   Patient to resume POC with hydroferra blue once back at home. To follow up with [facility] as directed.  Discussed POC with patient and bedside nurse.  Re consult if needed, will not follow at this time. Thanks  Akiva Josey [employer], RN,CWOCN, CNS, [place] [phone])
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
## 2019-09-08 NOTE — Plan of Care (Signed)
  Problem: Nutrition: Goal: Adequate nutrition will be maintained Outcome: Progressing   
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
## 2019-09-08 NOTE — Progress Notes (Signed)
Orthopedic Tech Progress Note Patient Details:  Tonya Hunter [birth year] [id number]  Ortho Devices Type of Ortho Device: Other (comment), Knee Immobilizer Ortho Device/Splint Location: rle bulky compressive dressing with knee immobilizer Ortho Device/Splint Interventions: Ordered, Application, Adjustment   Post Interventions Patient Tolerated: Well Instructions Provided: Care of device, Adjustment of device   [name] 09/08/2019, 4:00 AM
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
## 2019-09-09 ENCOUNTER — Inpatient Hospital Stay (HOSPITAL_COMMUNITY): Payer: Medicare Other

## 2019-09-09 DIAGNOSIS — R579 Shock, unspecified: Secondary | ICD-10-CM | POA: Diagnosis not present

## 2019-09-09 DIAGNOSIS — R609 Edema, unspecified: Secondary | ICD-10-CM | POA: Diagnosis not present

## 2019-09-09 LAB — TYPE AND SCREEN
ABO/RH(D): O POS
Antibody Screen: NEGATIVE
Unit division: 0
Unit division: 0
Unit division: 0
Unit division: 0

## 2019-09-09 LAB — BPAM RBC
Blood Product Expiration Date: 202105012359
Blood Product Expiration Date: 202105082359
Blood Product Expiration Date: 202105282359
Blood Product Expiration Date: 202105282359
ISSUE DATE / TIME: 202104241552
ISSUE DATE / TIME: 202104241604
Unit Type and Rh: 5100
Unit Type and Rh: 5100
Unit Type and Rh: 5100
Unit Type and Rh: 9500

## 2019-09-09 LAB — CBC WITH DIFFERENTIAL/PLATELET
Abs Immature Granulocytes: 0.05 10*3/uL (ref 0.00–0.07)
Basophils Absolute: 0 10*3/uL (ref 0.0–0.1)
Basophils Relative: 0 %
Eosinophils Absolute: 0.1 10*3/uL (ref 0.0–0.5)
Eosinophils Relative: 2 %
HCT: 24.7 % — ABNORMAL LOW (ref 36.0–46.0)
Hemoglobin: 8.3 g/dL — ABNORMAL LOW (ref 12.0–15.0)
Immature Granulocytes: 1 %
Lymphocytes Relative: 14 %
Lymphs Abs: 1 10*3/uL (ref 0.7–4.0)
MCH: 30.2 pg (ref 26.0–34.0)
MCHC: 33.6 g/dL (ref 30.0–36.0)
MCV: 89.8 fL (ref 80.0–100.0)
Monocytes Absolute: 0.8 10*3/uL (ref 0.1–1.0)
Monocytes Relative: 12 %
Neutro Abs: 5.1 10*3/uL (ref 1.7–7.7)
Neutrophils Relative %: 71 %
Platelets: 111 10*3/uL — ABNORMAL LOW (ref 150–400)
RBC: 2.75 MIL/uL — ABNORMAL LOW (ref 3.87–5.11)
RDW: 15.1 % (ref 11.5–15.5)
WBC: 7.1 10*3/uL (ref 4.0–10.5)
nRBC: 0 % (ref 0.0–0.2)

## 2019-09-09 LAB — GLUCOSE, CAPILLARY
Glucose-Capillary: 123 mg/dL — ABNORMAL HIGH (ref 70–99)
Glucose-Capillary: 143 mg/dL — ABNORMAL HIGH (ref 70–99)
Glucose-Capillary: 145 mg/dL — ABNORMAL HIGH (ref 70–99)
Glucose-Capillary: 151 mg/dL — ABNORMAL HIGH (ref 70–99)
Glucose-Capillary: 172 mg/dL — ABNORMAL HIGH (ref 70–99)
Glucose-Capillary: 178 mg/dL — ABNORMAL HIGH (ref 70–99)
Glucose-Capillary: 95 mg/dL (ref 70–99)

## 2019-09-09 LAB — COMPREHENSIVE METABOLIC PANEL
ALT: 76 U/L — ABNORMAL HIGH (ref 0–44)
AST: 136 U/L — ABNORMAL HIGH (ref 15–41)
Albumin: 2.7 g/dL — ABNORMAL LOW (ref 3.5–5.0)
Alkaline Phosphatase: 131 U/L — ABNORMAL HIGH (ref 38–126)
Anion gap: 11 (ref 5–15)
BUN: 28 mg/dL — ABNORMAL HIGH (ref 8–23)
CO2: 19 mmol/L — ABNORMAL LOW (ref 22–32)
Calcium: 8.4 mg/dL — ABNORMAL LOW (ref 8.9–10.3)
Chloride: 103 mmol/L (ref 98–111)
Creatinine, Ser: 1.27 mg/dL — ABNORMAL HIGH (ref 0.44–1.00)
GFR calc Af Amer: 46 mL/min — ABNORMAL LOW (ref >60)
GFR calc non Af Amer: 39 mL/min — ABNORMAL LOW (ref >60)
Glucose, Bld: 149 mg/dL — ABNORMAL HIGH (ref 70–99)
Potassium: 4.5 mmol/L (ref 3.5–5.1)
Sodium: 133 mmol/L — ABNORMAL LOW (ref 135–145)
Total Bilirubin: 0.9 mg/dL (ref 0.3–1.2)
Total Protein: 5.7 g/dL — ABNORMAL LOW (ref 6.5–8.1)

## 2019-09-09 LAB — LACTIC ACID, PLASMA: Lactic Acid, Venous: 2.4 mmol/L (ref 0.5–1.9)

## 2019-09-09 LAB — HEMOGLOBIN AND HEMATOCRIT, BLOOD
HCT: 22.5 % — ABNORMAL LOW (ref 36.0–46.0)
HCT: 23 % — ABNORMAL LOW (ref 36.0–46.0)
Hemoglobin: 7.3 g/dL — ABNORMAL LOW (ref 12.0–15.0)
Hemoglobin: 7.7 g/dL — ABNORMAL LOW (ref 12.0–15.0)

## 2019-09-09 LAB — BLOOD PRODUCT ORDER (VERBAL) VERIFICATION

## 2019-09-09 LAB — HEPARIN LEVEL (UNFRACTIONATED): Heparin Unfractionated: 0.53 IU/mL (ref 0.30–0.70)

## 2019-09-09 LAB — MAGNESIUM: Magnesium: 2.2 mg/dL (ref 1.7–2.4)

## 2019-09-09 LAB — PROTIME-INR
INR: 1.2 (ref 0.8–1.2)
Prothrombin Time: 15 seconds (ref 11.4–15.2)

## 2019-09-09 LAB — PHOSPHORUS: Phosphorus: 4.3 mg/dL (ref 2.5–4.6)

## 2019-09-09 LAB — VITAMIN D 25 HYDROXY (VIT D DEFICIENCY, FRACTURES): Vit D, 25-Hydroxy: 35.91 ng/mL (ref 30–100)

## 2019-09-09 LAB — TROPONIN I (HIGH SENSITIVITY): Troponin I (High Sensitivity): 236 ng/L (ref <18)

## 2019-09-09 MED ORDER — ACETAMINOPHEN 325 MG PO TABS
650.0000 mg | ORAL_TABLET | Freq: Four times a day (QID) | ORAL | Status: DC
  Administered 2019-09-09 – 2019-09-24 (×57): 650 mg via ORAL
  Filled 2019-09-09 (×58): qty 2

## 2019-09-09 MED ORDER — PANTOPRAZOLE SODIUM 40 MG PO TBEC
40.0000 mg | DELAYED_RELEASE_TABLET | Freq: Two times a day (BID) | ORAL | Status: DC
  Administered 2019-09-09 – 2019-09-24 (×29): 40 mg via ORAL
  Filled 2019-09-09 (×29): qty 1

## 2019-09-09 MED ORDER — POLYETHYLENE GLYCOL 3350 17 G PO PACK
17.0000 g | PACK | Freq: Every day | ORAL | Status: DC
  Administered 2019-09-09 – 2019-09-16 (×6): 17 g via ORAL
  Filled 2019-09-09 (×7): qty 1

## 2019-09-09 MED ORDER — SENNOSIDES-DOCUSATE SODIUM 8.6-50 MG PO TABS
1.0000 | ORAL_TABLET | Freq: Every day | ORAL | Status: DC
  Administered 2019-09-09 – 2019-09-16 (×6): 1 via ORAL
  Filled 2019-09-09 (×7): qty 1

## 2019-09-09 MED ORDER — HEPARIN (PORCINE) 25000 UT/250ML-% IV SOLN
1100.0000 [IU]/h | INTRAVENOUS | Status: AC
  Administered 2019-09-09: 1350 [IU]/h via INTRAVENOUS
  Administered 2019-09-10: 10:00:00 1150 [IU]/h via INTRAVENOUS
  Filled 2019-09-09 (×4): qty 250

## 2019-09-09 MED ORDER — OXYCODONE HCL 5 MG PO TABS
5.0000 mg | ORAL_TABLET | ORAL | Status: DC | PRN
  Administered 2019-09-09 – 2019-09-24 (×28): 5 mg via ORAL
  Filled 2019-09-09 (×29): qty 1

## 2019-09-09 MED ORDER — METHOCARBAMOL 500 MG PO TABS
500.0000 mg | ORAL_TABLET | Freq: Three times a day (TID) | ORAL | Status: DC
  Administered 2019-09-09 – 2019-09-24 (×46): 500 mg via ORAL
  Filled 2019-09-09 (×46): qty 1

## 2019-09-09 MED ORDER — LEVOTHYROXINE SODIUM 25 MCG PO TABS
137.0000 ug | ORAL_TABLET | Freq: Every day | ORAL | Status: DC
  Administered 2019-09-10 – 2019-09-24 (×15): 137 ug via ORAL
  Filled 2019-09-09 (×16): qty 1

## 2019-09-09 MED ORDER — SODIUM CHLORIDE 0.9 % IV BOLUS
1000.0000 mL | Freq: Once | INTRAVENOUS | Status: AC
  Administered 2019-09-09: 14:00:00 1000 mL via INTRAVENOUS

## 2019-09-09 MED ORDER — VANCOMYCIN HCL IN DEXTROSE 1-5 GM/200ML-% IV SOLN: 1000.0000 mg | INTRAVENOUS | Status: DC

## 2019-09-09 NOTE — Progress Notes (Signed)
I saw and spoke to Tonya Hunter and have reviewed her records and x-ray. She has a grossly comminuted right periprosthetic supracondylar femur fracture. She initially wanted me to take care of her as I have replaced both knees in the past. I had a conversation with her this evening and told her about the extent of her fracture and that I thought her interests might be best served by having one of the Orthopaedic traumatologists fix this. She is willing to have them do the surgery. I will see if Dr Marcelino Scot or Dr Doreatha Martin will be able to fit her into their schedules.
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
## 2019-09-09 NOTE — Progress Notes (Signed)
Lower venous duplex       has been completed. Preliminary results can be found under CV proc through chart review. June Leap, BS, RDMS, RVT  Results discussed with NP in patient's room
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
## 2019-09-10 ENCOUNTER — Encounter (HOSPITAL_BASED_OUTPATIENT_CLINIC_OR_DEPARTMENT_OTHER): Payer: Medicare Other | Admitting: Internal Medicine

## 2019-09-10 ENCOUNTER — Encounter (HOSPITAL_COMMUNITY)
Admission: EM | Disposition: A | Discharge status: Rehabilitation Facility | Payer: Self-pay | Source: Home / Self Care | Attending: Internal Medicine

## 2019-09-10 DIAGNOSIS — R579 Shock, unspecified: Secondary | ICD-10-CM | POA: Diagnosis not present

## 2019-09-10 LAB — HEPARIN LEVEL (UNFRACTIONATED)
Heparin Unfractionated: 0.75 IU/mL — ABNORMAL HIGH (ref 0.30–0.70)
Heparin Unfractionated: 0.56 IU/mL (ref 0.30–0.70)

## 2019-09-10 LAB — RENAL FUNCTION PANEL
Albumin: 2.5 g/dL — ABNORMAL LOW (ref 3.5–5.0)
Anion gap: 9 (ref 5–15)
BUN: 27 mg/dL — ABNORMAL HIGH (ref 8–23)
CO2: 19 mmol/L — ABNORMAL LOW (ref 22–32)
Calcium: 8.3 mg/dL — ABNORMAL LOW (ref 8.9–10.3)
Chloride: 103 mmol/L (ref 98–111)
Creatinine, Ser: 0.97 mg/dL (ref 0.44–1.00)
GFR calc Af Amer: >60 mL/min (ref >60)
GFR calc non Af Amer: 54 mL/min — ABNORMAL LOW (ref >60)
Glucose, Bld: 157 mg/dL — ABNORMAL HIGH (ref 70–99)
Phosphorus: 3.8 mg/dL (ref 2.5–4.6)
Potassium: 4.6 mmol/L (ref 3.5–5.1)
Sodium: 131 mmol/L — ABNORMAL LOW (ref 135–145)

## 2019-09-10 LAB — CBC
HCT: 21.9 % — ABNORMAL LOW (ref 36.0–46.0)
Hemoglobin: 7.1 g/dL — ABNORMAL LOW (ref 12.0–15.0)
MCH: 29 pg (ref 26.0–34.0)
MCHC: 32.4 g/dL (ref 30.0–36.0)
MCV: 89.4 fL (ref 80.0–100.0)
Platelets: 100 10*3/uL — ABNORMAL LOW (ref 150–400)
RBC: 2.45 MIL/uL — ABNORMAL LOW (ref 3.87–5.11)
RDW: 14.6 % (ref 11.5–15.5)
WBC: 5.3 10*3/uL (ref 4.0–10.5)
nRBC: 0 % (ref 0.0–0.2)

## 2019-09-10 LAB — GLUCOSE, CAPILLARY
Glucose-Capillary: 166 mg/dL — ABNORMAL HIGH (ref 70–99)
Glucose-Capillary: 167 mg/dL — ABNORMAL HIGH (ref 70–99)
Glucose-Capillary: 167 mg/dL — ABNORMAL HIGH (ref 70–99)
Glucose-Capillary: 183 mg/dL — ABNORMAL HIGH (ref 70–99)
Glucose-Capillary: 201 mg/dL — ABNORMAL HIGH (ref 70–99)
Glucose-Capillary: 214 mg/dL — ABNORMAL HIGH (ref 70–99)

## 2019-09-10 LAB — PREPARE RBC (CROSSMATCH)

## 2019-09-10 LAB — MAGNESIUM: Magnesium: 1.9 mg/dL (ref 1.7–2.4)

## 2019-09-10 LAB — HEMOGLOBIN AND HEMATOCRIT, BLOOD
HCT: 20.7 % — ABNORMAL LOW (ref 36.0–46.0)
HCT: 24.1 % — ABNORMAL LOW (ref 36.0–46.0)
Hemoglobin: 6.9 g/dL — CL (ref 12.0–15.0)
Hemoglobin: 8 g/dL — ABNORMAL LOW (ref 12.0–15.0)

## 2019-09-10 LAB — AMMONIA: Ammonia: 42 umol/L — ABNORMAL HIGH (ref 9–35)

## 2019-09-10 SURGERY — OPEN REDUCTION INTERNAL FIXATION (ORIF) DISTAL FEMUR FRACTURE
Anesthesia: General | Laterality: Right

## 2019-09-10 MED ORDER — INSULIN ASPART 100 UNIT/ML ~~LOC~~ SOLN
0.0000 [IU] | SUBCUTANEOUS | Status: DC
  Administered 2019-09-10: 3 [IU] via SUBCUTANEOUS
  Administered 2019-09-10 – 2019-09-11 (×3): 2 [IU] via SUBCUTANEOUS
  Administered 2019-09-11 (×2): 3 [IU] via SUBCUTANEOUS

## 2019-09-10 MED ORDER — NOREPINEPHRINE 4 MG/250ML-% IV SOLN: 2.0000 ug/min | INTRAVENOUS | Status: DC

## 2019-09-10 MED ORDER — SODIUM CHLORIDE 0.9% IV SOLUTION: Freq: Once | INTRAVENOUS | Status: DC

## 2019-09-10 MED ORDER — HYDROMORPHONE HCL 1 MG/ML IJ SOLN
0.5000 mg | INTRAMUSCULAR | Status: DC | PRN
  Administered 2019-09-10 – 2019-09-12 (×5): 0.5 mg via INTRAVENOUS
  Filled 2019-09-10 (×5): qty 1

## 2019-09-10 MED ORDER — MAGNESIUM SULFATE 2 GM/50ML IV SOLN
2.0000 g | Freq: Once | INTRAVENOUS | Status: AC
  Administered 2019-09-10: 2 g via INTRAVENOUS
  Filled 2019-09-10: qty 50

## 2019-09-10 NOTE — Progress Notes (Signed)
CRITICAL VALUE STICKER  CRITICAL VALUE: hgb 6.9  RECEIVER (on-site recipient of call): Caryl Comes RN  DATE & TIME NOTIFIED: 09/10/19 @ 0915  MESSENGER (representative from lab):  MD NOTIFIED: Hayden Pedro, NP  TIME OF NOTIFICATION: 0920   RESPONSE: New orders placed
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
## 2019-09-11 ENCOUNTER — Inpatient Hospital Stay (HOSPITAL_COMMUNITY): Payer: Medicare Other

## 2019-09-11 HISTORY — PX: IR IVC FILTER PLMT / S&I /IMG GUID/MOD SED: IMG701

## 2019-09-11 LAB — BASIC METABOLIC PANEL
Anion gap: 7 (ref 5–15)
BUN: 25 mg/dL — ABNORMAL HIGH (ref 8–23)
CO2: 21 mmol/L — ABNORMAL LOW (ref 22–32)
Calcium: 8.1 mg/dL — ABNORMAL LOW (ref 8.9–10.3)
Chloride: 102 mmol/L (ref 98–111)
Creatinine, Ser: 0.96 mg/dL (ref 0.44–1.00)
GFR calc Af Amer: >60 mL/min (ref >60)
GFR calc non Af Amer: 55 mL/min — ABNORMAL LOW (ref >60)
Glucose, Bld: 196 mg/dL — ABNORMAL HIGH (ref 70–99)
Potassium: 4.7 mmol/L (ref 3.5–5.1)
Sodium: 130 mmol/L — ABNORMAL LOW (ref 135–145)

## 2019-09-11 LAB — GLUCOSE, CAPILLARY
Glucose-Capillary: 125 mg/dL — ABNORMAL HIGH (ref 70–99)
Glucose-Capillary: 140 mg/dL — ABNORMAL HIGH (ref 70–99)
Glucose-Capillary: 172 mg/dL — ABNORMAL HIGH (ref 70–99)
Glucose-Capillary: 195 mg/dL — ABNORMAL HIGH (ref 70–99)
Glucose-Capillary: 202 mg/dL — ABNORMAL HIGH (ref 70–99)

## 2019-09-11 LAB — CBC
HCT: 21.5 % — ABNORMAL LOW (ref 36.0–46.0)
Hemoglobin: 7.1 g/dL — ABNORMAL LOW (ref 12.0–15.0)
MCH: 29.2 pg (ref 26.0–34.0)
MCHC: 33 g/dL (ref 30.0–36.0)
MCV: 88.5 fL (ref 80.0–100.0)
Platelets: 96 10*3/uL — ABNORMAL LOW (ref 150–400)
RBC: 2.43 MIL/uL — ABNORMAL LOW (ref 3.87–5.11)
RDW: 14.6 % (ref 11.5–15.5)
WBC: 4.2 10*3/uL (ref 4.0–10.5)
nRBC: 0 % (ref 0.0–0.2)

## 2019-09-11 LAB — AMMONIA: Ammonia: 49 umol/L — ABNORMAL HIGH (ref 9–35)

## 2019-09-11 LAB — HEPARIN LEVEL (UNFRACTIONATED)
Heparin Unfractionated: 0.41 IU/mL (ref 0.30–0.70)
Heparin Unfractionated: 0.42 IU/mL (ref 0.30–0.70)

## 2019-09-11 LAB — MAGNESIUM: Magnesium: 2.1 mg/dL (ref 1.7–2.4)

## 2019-09-11 LAB — PREPARE RBC (CROSSMATCH)

## 2019-09-11 LAB — PHOSPHORUS: Phosphorus: 3 mg/dL (ref 2.5–4.6)

## 2019-09-11 LAB — PROTIME-INR
INR: 1.3 — ABNORMAL HIGH (ref 0.8–1.2)
Prothrombin Time: 15.4 seconds — ABNORMAL HIGH (ref 11.4–15.2)

## 2019-09-11 MED ORDER — CHLORHEXIDINE GLUCONATE 4 % EX LIQD
60.0000 mL | Freq: Once | CUTANEOUS | Status: DC
  Filled 2019-09-11: qty 60

## 2019-09-11 MED ORDER — POVIDONE-IODINE 10 % EX SWAB: 2.0000 "application " | Freq: Once | CUTANEOUS | Status: DC

## 2019-09-11 MED ORDER — FUROSEMIDE 10 MG/ML IJ SOLN
20.0000 mg | Freq: Once | INTRAMUSCULAR | Status: AC
  Administered 2019-09-11: 20 mg via INTRAVENOUS
  Filled 2019-09-11: qty 2

## 2019-09-11 MED ORDER — INSULIN ASPART 100 UNIT/ML ~~LOC~~ SOLN
0.0000 [IU] | SUBCUTANEOUS | Status: DC
  Administered 2019-09-11 (×3): 2 [IU] via SUBCUTANEOUS
  Administered 2019-09-11 – 2019-09-12 (×2): 3 [IU] via SUBCUTANEOUS
  Administered 2019-09-12: 5 [IU] via SUBCUTANEOUS
  Administered 2019-09-12: 2 [IU] via SUBCUTANEOUS
  Administered 2019-09-13 (×2): 5 [IU] via SUBCUTANEOUS

## 2019-09-11 MED ORDER — FENTANYL CITRATE (PF) 100 MCG/2ML IJ SOLN
INTRAMUSCULAR | Status: AC
  Filled 2019-09-11: qty 2

## 2019-09-11 MED ORDER — LIDOCAINE HCL 1 % IJ SOLN
INTRAMUSCULAR | Status: AC
  Filled 2019-09-11: qty 20

## 2019-09-11 MED ORDER — SODIUM CHLORIDE 0.9% IV SOLUTION: Freq: Once | INTRAVENOUS | Status: AC

## 2019-09-11 MED ORDER — FENTANYL CITRATE (PF) 100 MCG/2ML IJ SOLN
INTRAMUSCULAR | Status: AC | PRN
  Administered 2019-09-11: 50 ug via INTRAVENOUS

## 2019-09-11 MED ORDER — CHLORHEXIDINE GLUCONATE 4 % EX LIQD
60.0000 mL | Freq: Once | CUTANEOUS | Status: AC
  Administered 2019-09-12: 4 via TOPICAL
  Filled 2019-09-11: qty 60

## 2019-09-11 MED ORDER — IOHEXOL 300 MG/ML  SOLN
100.0000 mL | Freq: Once | INTRAMUSCULAR | Status: AC | PRN
  Administered 2019-09-11: 35 mL via INTRAVENOUS

## 2019-09-11 MED ORDER — MIDAZOLAM HCL 2 MG/2ML IJ SOLN
INTRAMUSCULAR | Status: AC
  Filled 2019-09-11: qty 2

## 2019-09-11 MED ORDER — LIDOCAINE HCL (PF) 1 % IJ SOLN
INTRAMUSCULAR | Status: AC | PRN
  Administered 2019-09-11: 5 mL

## 2019-09-11 MED ORDER — MIDAZOLAM HCL 2 MG/2ML IJ SOLN
INTRAMUSCULAR | Status: AC | PRN
  Administered 2019-09-11: 1 mg via INTRAVENOUS
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
## 2019-09-11 NOTE — Procedures (Signed)
Interventional Radiology Procedure Note  Procedure: IVC filter via right IJ approach  Complications: None Recommendations:  - Routine wound care    Signed,  Dulcy Fanny. Earleen Newport, DO
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
## 2019-09-11 NOTE — Progress Notes (Signed)
Inpatient Diabetes Program Recommendations  AACE/ADA: New Consensus Statement on Inpatient Glycemic Control (2015)  Target Ranges:  Prepandial:   less than 140 mg/dL      Peak postprandial:   less than 180 mg/dL (1-2 hours)      Critically ill patients:  140 - 180 mg/dL   Lab Results  Component Value Date   GLUCAP 202 (H) 09/11/2019   HGBA1C 9.2 (H) 09/07/2019    Review of Glycemic Control Results for [name], [name] (MRN [id number]) as of 09/11/2019 09:21  Ref. Range 09/10/2019 15:33 09/10/2019 19:54 09/10/2019 23:35 09/11/2019 03:52 09/11/2019 08:30  Glucose-Capillary Latest Ref Range: 70 - 99 mg/dL 214 (H) 183 (H) 201 (H) 195 (H) 202 (H)   Diabetes history: DM 2 Outpatient Diabetes medications: Metformin 500 mg bid (has been on insulin in recent past currently trying diet) Current orders for Inpatient glycemic control: Novolog 0-15 units Q4 hours  Inpatient Diabetes Program Recommendations:    Noted Novolog correction increased this am from sensitive to moderate correction 0-15 units Q4.  A1c 9.2% this admission, uncontrolled at home.   Pt would benefit from adding back basal insulin now and at time of d/c.  Thanks,  Tama Headings RN, MSN, BC-ADM Inpatient Diabetes Coordinator Team Pager [phone] (8a-5p)
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
## 2019-09-12 ENCOUNTER — Inpatient Hospital Stay (HOSPITAL_COMMUNITY): Payer: Medicare Other | Admitting: Certified Registered Nurse Anesthetist

## 2019-09-12 ENCOUNTER — Inpatient Hospital Stay (HOSPITAL_COMMUNITY): Payer: Medicare Other

## 2019-09-12 ENCOUNTER — Encounter (HOSPITAL_COMMUNITY)
Admission: EM | Disposition: A | Discharge status: Rehabilitation Facility | Payer: Self-pay | Source: Home / Self Care | Attending: Internal Medicine

## 2019-09-12 ENCOUNTER — Encounter (HOSPITAL_COMMUNITY): Payer: Self-pay | Admitting: Pulmonary Disease

## 2019-09-12 DIAGNOSIS — R579 Shock, unspecified: Secondary | ICD-10-CM | POA: Diagnosis not present

## 2019-09-12 HISTORY — PX: ORIF FEMUR FRACTURE: SHX2119

## 2019-09-12 LAB — CULTURE, BLOOD (ROUTINE X 2)
Culture: NO GROWTH
Culture: NO GROWTH
Special Requests: ADEQUATE
Special Requests: ADEQUATE

## 2019-09-12 LAB — BASIC METABOLIC PANEL
Anion gap: 12 (ref 5–15)
BUN: 29 mg/dL — ABNORMAL HIGH (ref 8–23)
CO2: 19 mmol/L — ABNORMAL LOW (ref 22–32)
Calcium: 8.3 mg/dL — ABNORMAL LOW (ref 8.9–10.3)
Chloride: 100 mmol/L (ref 98–111)
Creatinine, Ser: 1.04 mg/dL — ABNORMAL HIGH (ref 0.44–1.00)
GFR calc Af Amer: 58 mL/min — ABNORMAL LOW (ref >60)
GFR calc non Af Amer: 50 mL/min — ABNORMAL LOW (ref >60)
Glucose, Bld: 215 mg/dL — ABNORMAL HIGH (ref 70–99)
Potassium: 4.9 mmol/L (ref 3.5–5.1)
Sodium: 131 mmol/L — ABNORMAL LOW (ref 135–145)

## 2019-09-12 LAB — CBC
HCT: 24.5 % — ABNORMAL LOW (ref 36.0–46.0)
HCT: 25.2 % — ABNORMAL LOW (ref 36.0–46.0)
Hemoglobin: 8.2 g/dL — ABNORMAL LOW (ref 12.0–15.0)
Hemoglobin: 8.1 g/dL — ABNORMAL LOW (ref 12.0–15.0)
MCH: 30.1 pg (ref 26.0–34.0)
MCH: 29.7 pg (ref 26.0–34.0)
MCHC: 33.5 g/dL (ref 30.0–36.0)
MCHC: 32.1 g/dL (ref 30.0–36.0)
MCV: 90.1 fL (ref 80.0–100.0)
MCV: 92.3 fL (ref 80.0–100.0)
Platelets: 126 10*3/uL — ABNORMAL LOW (ref 150–400)
Platelets: 141 10*3/uL — ABNORMAL LOW (ref 150–400)
RBC: 2.72 MIL/uL — ABNORMAL LOW (ref 3.87–5.11)
RBC: 2.73 MIL/uL — ABNORMAL LOW (ref 3.87–5.11)
RDW: 14.6 % (ref 11.5–15.5)
RDW: 15.6 % — ABNORMAL HIGH (ref 11.5–15.5)
WBC: 5.7 10*3/uL (ref 4.0–10.5)
WBC: 7 10*3/uL (ref 4.0–10.5)
nRBC: 0 % (ref 0.0–0.2)
nRBC: 0 % (ref 0.0–0.2)

## 2019-09-12 LAB — GLUCOSE, CAPILLARY
Glucose-Capillary: 133 mg/dL — ABNORMAL HIGH (ref 70–99)
Glucose-Capillary: 146 mg/dL — ABNORMAL HIGH (ref 70–99)
Glucose-Capillary: 154 mg/dL — ABNORMAL HIGH (ref 70–99)
Glucose-Capillary: 162 mg/dL — ABNORMAL HIGH (ref 70–99)
Glucose-Capillary: 168 mg/dL — ABNORMAL HIGH (ref 70–99)
Glucose-Capillary: 151 mg/dL — ABNORMAL HIGH (ref 70–99)
Glucose-Capillary: 228 mg/dL — ABNORMAL HIGH (ref 70–99)

## 2019-09-12 LAB — COMPREHENSIVE METABOLIC PANEL
ALT: 78 U/L — ABNORMAL HIGH (ref 0–44)
AST: 97 U/L — ABNORMAL HIGH (ref 15–41)
Albumin: 2.3 g/dL — ABNORMAL LOW (ref 3.5–5.0)
Alkaline Phosphatase: 128 U/L — ABNORMAL HIGH (ref 38–126)
Anion gap: 11 (ref 5–15)
BUN: 27 mg/dL — ABNORMAL HIGH (ref 8–23)
CO2: 17 mmol/L — ABNORMAL LOW (ref 22–32)
Calcium: 8.2 mg/dL — ABNORMAL LOW (ref 8.9–10.3)
Chloride: 103 mmol/L (ref 98–111)
Creatinine, Ser: 1.02 mg/dL — ABNORMAL HIGH (ref 0.44–1.00)
GFR calc Af Amer: 59 mL/min — ABNORMAL LOW (ref >60)
GFR calc non Af Amer: 51 mL/min — ABNORMAL LOW (ref >60)
Glucose, Bld: 138 mg/dL — ABNORMAL HIGH (ref 70–99)
Potassium: 4.7 mmol/L (ref 3.5–5.1)
Sodium: 131 mmol/L — ABNORMAL LOW (ref 135–145)
Total Bilirubin: 1.7 mg/dL — ABNORMAL HIGH (ref 0.3–1.2)
Total Protein: 5.3 g/dL — ABNORMAL LOW (ref 6.5–8.1)

## 2019-09-12 LAB — BPAM RBC
Blood Product Expiration Date: 202105292359
Blood Product Expiration Date: 202105292359
ISSUE DATE / TIME: 202104270952
ISSUE DATE / TIME: 202104281620
Unit Type and Rh: 5100
Unit Type and Rh: 5100

## 2019-09-12 LAB — PHOSPHORUS: Phosphorus: 3.2 mg/dL (ref 2.5–4.6)

## 2019-09-12 LAB — MAGNESIUM: Magnesium: 1.9 mg/dL (ref 1.7–2.4)

## 2019-09-12 LAB — TYPE AND SCREEN
ABO/RH(D): O POS
Antibody Screen: NEGATIVE
Unit division: 0
Unit division: 0

## 2019-09-12 LAB — SURGICAL PCR SCREEN
MRSA, PCR: NEGATIVE
Staphylococcus aureus: POSITIVE — AB

## 2019-09-12 LAB — VITAMIN D 25 HYDROXY (VIT D DEFICIENCY, FRACTURES): Vit D, 25-Hydroxy: 30.79 ng/mL (ref 30–100)

## 2019-09-12 SURGERY — OPEN REDUCTION INTERNAL FIXATION (ORIF) DISTAL FEMUR FRACTURE
Anesthesia: General | Laterality: Right

## 2019-09-12 MED ORDER — LACTATED RINGERS IV SOLN: INTRAVENOUS | Status: DC

## 2019-09-12 MED ORDER — ONDANSETRON HCL 4 MG/2ML IJ SOLN
INTRAMUSCULAR | Status: AC
  Filled 2019-09-12: qty 2

## 2019-09-12 MED ORDER — VANCOMYCIN HCL 1000 MG IV SOLR
INTRAVENOUS | Status: DC | PRN
  Administered 2019-09-12: 1000 mg

## 2019-09-12 MED ORDER — LIDOCAINE 2% (20 MG/ML) 5 ML SYRINGE
INTRAMUSCULAR | Status: DC | PRN
  Administered 2019-09-12: 60 mg via INTRAVENOUS

## 2019-09-12 MED ORDER — ALBUMIN HUMAN 5 % IV SOLN
INTRAVENOUS | Status: DC | PRN
Start: 2019-09-12 — End: 2019-09-12

## 2019-09-12 MED ORDER — DEXAMETHASONE SODIUM PHOSPHATE 10 MG/ML IJ SOLN
INTRAMUSCULAR | Status: AC
  Filled 2019-09-12: qty 1

## 2019-09-12 MED ORDER — TOBRAMYCIN SULFATE 1.2 G IJ SOLR
INTRAMUSCULAR | Status: AC
  Filled 2019-09-12: qty 1.2

## 2019-09-12 MED ORDER — VANCOMYCIN HCL 1250 MG/250ML IV SOLN
1250.0000 mg | INTRAVENOUS | Status: DC
  Administered 2019-09-12: 1250 mg via INTRAVENOUS
  Filled 2019-09-12 (×2): qty 250

## 2019-09-12 MED ORDER — PHENYLEPHRINE 40 MCG/ML (10ML) SYRINGE FOR IV PUSH (FOR BLOOD PRESSURE SUPPORT)
PREFILLED_SYRINGE | INTRAVENOUS | Status: DC | PRN
  Administered 2019-09-12 (×2): 80 ug via INTRAVENOUS

## 2019-09-12 MED ORDER — ENOXAPARIN SODIUM 40 MG/0.4ML ~~LOC~~ SOLN
40.0000 mg | SUBCUTANEOUS | Status: DC
  Administered 2019-09-13: 40 mg via SUBCUTANEOUS
  Filled 2019-09-12: qty 0.4

## 2019-09-12 MED ORDER — FENTANYL CITRATE (PF) 250 MCG/5ML IJ SOLN
INTRAMUSCULAR | Status: AC
  Filled 2019-09-12: qty 5

## 2019-09-12 MED ORDER — ONDANSETRON HCL 4 MG/2ML IJ SOLN
INTRAMUSCULAR | Status: DC | PRN
  Administered 2019-09-12: 4 mg via INTRAVENOUS

## 2019-09-12 MED ORDER — SUGAMMADEX SODIUM 200 MG/2ML IV SOLN
INTRAVENOUS | Status: DC | PRN
  Administered 2019-09-12: 200 mg via INTRAVENOUS

## 2019-09-12 MED ORDER — VANCOMYCIN HCL 1000 MG IV SOLR
INTRAVENOUS | Status: AC
  Filled 2019-09-12: qty 1000

## 2019-09-12 MED ORDER — ONDANSETRON HCL 4 MG/2ML IJ SOLN: 4.0000 mg | Freq: Once | INTRAMUSCULAR | Status: DC | PRN

## 2019-09-12 MED ORDER — PROPOFOL 10 MG/ML IV BOLUS
INTRAVENOUS | Status: AC
  Filled 2019-09-12: qty 20

## 2019-09-12 MED ORDER — FENTANYL CITRATE (PF) 100 MCG/2ML IJ SOLN: 25.0000 ug | INTRAMUSCULAR | Status: DC | PRN

## 2019-09-12 MED ORDER — PROPOFOL 10 MG/ML IV BOLUS
INTRAVENOUS | Status: DC | PRN
  Administered 2019-09-12: 100 mg via INTRAVENOUS

## 2019-09-12 MED ORDER — ROCURONIUM BROMIDE 10 MG/ML (PF) SYRINGE
PREFILLED_SYRINGE | INTRAVENOUS | Status: DC | PRN
  Administered 2019-09-12: 40 mg via INTRAVENOUS
  Administered 2019-09-12: 30 mg via INTRAVENOUS

## 2019-09-12 MED ORDER — FENTANYL CITRATE (PF) 250 MCG/5ML IJ SOLN
INTRAMUSCULAR | Status: DC | PRN
  Administered 2019-09-12: 50 ug via INTRAVENOUS
  Administered 2019-09-12: 25 ug via INTRAVENOUS
  Administered 2019-09-12: 50 ug via INTRAVENOUS
  Administered 2019-09-12: 25 ug via INTRAVENOUS
  Administered 2019-09-12 (×2): 50 ug via INTRAVENOUS

## 2019-09-12 MED ORDER — TOBRAMYCIN SULFATE 1.2 G IJ SOLR
INTRAMUSCULAR | Status: DC | PRN
  Administered 2019-09-12: 1.2 g

## 2019-09-12 MED ORDER — HYDROMORPHONE HCL 1 MG/ML IJ SOLN
0.5000 mg | INTRAMUSCULAR | Status: DC | PRN
  Administered 2019-09-18: 1 mg via INTRAVENOUS
  Administered 2019-09-20: 0.5 mg via INTRAVENOUS
  Filled 2019-09-12 (×2): qty 1

## 2019-09-12 MED ORDER — VANCOMYCIN HCL IN DEXTROSE 1-5 GM/200ML-% IV SOLN
1000.0000 mg | INTRAVENOUS | Status: AC
  Administered 2019-09-12: 12:00:00 1000 mg via INTRAVENOUS

## 2019-09-12 MED ORDER — DEXAMETHASONE SODIUM PHOSPHATE 10 MG/ML IJ SOLN
INTRAMUSCULAR | Status: DC | PRN
  Administered 2019-09-12: 4 mg via INTRAVENOUS

## 2019-09-12 MED ORDER — 0.9 % SODIUM CHLORIDE (POUR BTL) OPTIME
TOPICAL | Status: DC | PRN
  Administered 2019-09-12: 1000 mL

## 2019-09-12 SURGICAL SUPPLY — 77 items
BIT DRILL 4.3 (BIT) ×2
BIT DRILL 4.3MM (BIT) ×1
BIT DRILL 4.3X300MM (BIT) ×1 IMPLANT
BIT DRILL LONG 3.3 (BIT) ×2 IMPLANT
BIT DRILL LONG 3.3MM (BIT) ×1
BIT DRILL QC 3.3X195 (BIT) ×3 IMPLANT
BLADE CLIPPER SURG (BLADE) IMPLANT
BNDG COHESIVE 6X5 TAN STRL LF (GAUZE/BANDAGES/DRESSINGS) ×3 IMPLANT
BNDG CONFORM 2 STRL LF (GAUZE/BANDAGES/DRESSINGS) ×3 IMPLANT
BNDG ELASTIC 4X5.8 VLCR STR LF (GAUZE/BANDAGES/DRESSINGS) ×3 IMPLANT
BNDG ELASTIC 6X10 VLCR STRL LF (GAUZE/BANDAGES/DRESSINGS) ×3 IMPLANT
BNDG ELASTIC 6X5.8 VLCR STR LF (GAUZE/BANDAGES/DRESSINGS) ×3 IMPLANT
BRUSH SCRUB EZ PLAIN DRY (MISCELLANEOUS) ×6 IMPLANT
CANISTER SUCT 3000ML PPV (MISCELLANEOUS) ×3 IMPLANT
CAP LOCK NCB (Cap) ×21 IMPLANT
CHLORAPREP W/TINT 26 (MISCELLANEOUS) ×3 IMPLANT
COVER SURGICAL LIGHT HANDLE (MISCELLANEOUS) ×3 IMPLANT
COVER WAND RF STERILE (DRAPES) IMPLANT
DRAPE C-ARM 42X72 X-RAY (DRAPES) ×3 IMPLANT
DRAPE C-ARMOR (DRAPES) ×3 IMPLANT
DRAPE HALF SHEET 40X57 (DRAPES) ×6 IMPLANT
DRAPE ORTHO SPLIT 77X108 STRL (DRAPES) ×6
DRAPE SURG 17X23 STRL (DRAPES) ×3 IMPLANT
DRAPE SURG ORHT 6 SPLT 77X108 (DRAPES) ×2 IMPLANT
DRAPE U-SHAPE 47X51 STRL (DRAPES) ×3 IMPLANT
DRSG ADAPTIC 3X8 NADH LF (GAUZE/BANDAGES/DRESSINGS) IMPLANT
DRSG EMULSION OIL 3X3 NADH (GAUZE/BANDAGES/DRESSINGS) ×3 IMPLANT
DRSG MEPILEX BORDER 4X12 (GAUZE/BANDAGES/DRESSINGS) IMPLANT
DRSG MEPILEX BORDER 4X4 (GAUZE/BANDAGES/DRESSINGS) ×3 IMPLANT
DRSG MEPILEX BORDER 4X8 (GAUZE/BANDAGES/DRESSINGS) ×3 IMPLANT
DRSG PAD ABDOMINAL 8X10 ST (GAUZE/BANDAGES/DRESSINGS) ×6 IMPLANT
ELECT REM PT RETURN 9FT ADLT (ELECTROSURGICAL) ×3
ELECTRODE REM PT RTRN 9FT ADLT (ELECTROSURGICAL) ×1 IMPLANT
GAUZE SPONGE 4X4 12PLY STRL (GAUZE/BANDAGES/DRESSINGS) ×3 IMPLANT
GLOVE BIO SURGEON STRL SZ 6.5 (GLOVE) ×6 IMPLANT
GLOVE BIO SURGEON STRL SZ7.5 (GLOVE) ×12 IMPLANT
GLOVE BIO SURGEONS STRL SZ 6.5 (GLOVE) ×3
GLOVE BIOGEL PI IND STRL 6.5 (GLOVE) ×1 IMPLANT
GLOVE BIOGEL PI IND STRL 7.5 (GLOVE) ×1 IMPLANT
GLOVE BIOGEL PI INDICATOR 6.5 (GLOVE) ×2
GLOVE BIOGEL PI INDICATOR 7.5 (GLOVE) ×2
GOWN STRL REUS W/ TWL LRG LVL3 (GOWN DISPOSABLE) ×3 IMPLANT
GOWN STRL REUS W/TWL LRG LVL3 (GOWN DISPOSABLE) ×9
K-WIRE 2.0 (WIRE) ×3
K-WIRE FXSTD 280X2XNS SS (WIRE) ×1
KIT BASIN OR (CUSTOM PROCEDURE TRAY) ×3 IMPLANT
KIT TURNOVER KIT B (KITS) ×3 IMPLANT
KWIRE FXSTD 280X2XNS SS (WIRE) ×1 IMPLANT
NS IRRIG 1000ML POUR BTL (IV SOLUTION) ×3 IMPLANT
PACK TOTAL JOINT (CUSTOM PROCEDURE TRAY) ×3 IMPLANT
PAD ARMBOARD 7.5X6 YLW CONV (MISCELLANEOUS) ×6 IMPLANT
PAD CAST 4YDX4 CTTN HI CHSV (CAST SUPPLIES) ×1 IMPLANT
PADDING CAST COTTON 4X4 STRL (CAST SUPPLIES) ×3
PADDING CAST COTTON 6X4 STRL (CAST SUPPLIES) ×3 IMPLANT
PLATE FEM DIST NCB PP 278MM (Plate) ×3 IMPLANT
SCREW 5.0 80MM (Screw) ×6 IMPLANT
SCREW CORT NCB SELFTAP 5.0X42 (Screw) ×3 IMPLANT
SCREW CORTICAL NCB 5.0X90MM (Screw) ×3 IMPLANT
SCREW NCB 4.0MX38M (Screw) ×3 IMPLANT
SCREW NCB 5.0X38 (Screw) ×3 IMPLANT
SCREW NCB 5.0X85MM (Screw) ×6 IMPLANT
SPONGE LAP 18X18 RF (DISPOSABLE) IMPLANT
STAPLER VISISTAT 35W (STAPLE) ×3 IMPLANT
SUCTION FRAZIER HANDLE 10FR (MISCELLANEOUS) ×3
SUCTION TUBE FRAZIER 10FR DISP (MISCELLANEOUS) ×1 IMPLANT
SUT ETHILON 3 0 PS 1 (SUTURE) ×9 IMPLANT
SUT MNCRL AB 3-0 PS2 18 (SUTURE) ×3 IMPLANT
SUT MON AB 2-0 CT1 36 (SUTURE) ×3 IMPLANT
SUT VIC AB 0 CT1 27 (SUTURE) ×6
SUT VIC AB 0 CT1 27XBRD ANBCTR (SUTURE) ×2 IMPLANT
SUT VIC AB 1 CT1 27 (SUTURE)
SUT VIC AB 1 CT1 27XBRD ANBCTR (SUTURE) IMPLANT
SUT VIC AB 2-0 CT1 27 (SUTURE) ×6
SUT VIC AB 2-0 CT1 TAPERPNT 27 (SUTURE) ×2 IMPLANT
TOWEL GREEN STERILE (TOWEL DISPOSABLE) ×6 IMPLANT
TRAY FOLEY MTR SLVR 16FR STAT (SET/KITS/TRAYS/PACK) IMPLANT
WATER STERILE IRR 1000ML POUR (IV SOLUTION) IMPLANT
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
## 2019-09-12 NOTE — Anesthesia Postprocedure Evaluation (Signed)
Anesthesia Post Note  Patient: Tonya Hunter  Procedure(s) Performed: OPEN REDUCTION INTERNAL FIXATION (ORIF) DISTAL FEMUR FRACTURE (Right )     Patient location during evaluation: PACU Anesthesia Type: General Level of consciousness: awake Pain management: pain level controlled Vital Signs Assessment: post-procedure vital signs reviewed and stable Respiratory status: spontaneous breathing, nonlabored ventilation, respiratory function stable and patient connected to nasal cannula oxygen Cardiovascular status: blood pressure returned to baseline and stable Postop Assessment: no apparent nausea or vomiting Anesthetic complications: no    Last Vitals:  Vitals:   09/12/19 1412 09/12/19 1427  BP: 137/72 (!) 116/53  Pulse: 97 99  Resp: 10 14  Temp:  36.5 C  SpO2: 98% 95%    Last Pain:  Vitals:   09/12/19 1452  TempSrc:   PainSc: 10-Worst pain ever                 [name]
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
## 2019-09-12 NOTE — Anesthesia Preprocedure Evaluation (Addendum)
Anesthesia Evaluation  Patient identified by MRN, date of birth, ID band Patient awake    Reviewed: Allergy & Precautions, NPO status , Patient's Chart, lab work & pertinent test results  Airway Mallampati: III  TM Distance: >3 FB Neck ROM: Full    Dental no notable dental hx.    Pulmonary  B/l rib fx (R3-8, L3-7   Pulmonary exam normal breath sounds clear to auscultation       Cardiovascular hypertension, Pt. on medications and Pt. on home beta blockers + CAD, + Past MI, + Cardiac Stents (x 2 placed 11 years ago) and + DVT (s/p IVC filter)  Normal cardiovascular exam+ Valvular Problems/Murmurs AS  Rhythm:Regular Rate:Normal  ECG: SR, rate 60. RBBB, LAFB  ECHO: 1. Left ventricular ejection fraction, by estimation, is 65 to 70%. The left ventricle has normal function. Left ventricular endocardial border not optimally defined to evaluate regional wall motion. Left ventricular diastolic function could not be evaluated. 2. Right ventricular systolic function is mildly reduced. The right ventricular size is normal. Tricuspid regurgitation signal is inadequate for assessing PA pressure. 3. Left atrial size was severely dilated. 4. The mitral valve is abnormal. Trivial mitral valve regurgitation. No evidence of mitral stenosis. 5. The aortic valve is abnormal. Aortic valve regurgitation is not visualized. Mild aortic valve stenosis. Aortic valve mean gradient measures 11.0 mmHg. 6. The inferior vena cava is normal in size with greater than 50% respiratory variability, suggesting right atrial pressure of 3 mmHg.   Neuro/Psych CVA negative psych ROS   GI/Hepatic negative GI ROS, (+) Cirrhosis       ,   Endo/Other  diabetes, Oral Hypoglycemic AgentsHypothyroidism Hyponatremia   Renal/GU negative Renal ROS     Musculoskeletal negative musculoskeletal ROS (+)   Abdominal   Peds  Hematology  (+) anemia ,  HLD Thrombocytopenia    Anesthesia Other Findings Right distal femur fx  Reproductive/Obstetrics                            Anesthesia Physical Anesthesia Plan  ASA: III  Anesthesia Plan: General   Post-op Pain Management:    Induction: Intravenous  PONV Risk Score and Plan: 3 and Ondansetron, Dexamethasone and Treatment may vary due to age or medical condition  Airway Management Planned: Oral ETT  Additional Equipment:   Intra-op Plan:   Post-operative Plan: Extubation in OR  Informed Consent: I have reviewed the patients History and Physical, chart, labs and discussed the procedure including the risks, benefits and alternatives for the proposed anesthesia with the patient or authorized representative who has indicated his/her understanding and acceptance.     Dental advisory given  Plan Discussed with: CRNA  Anesthesia Plan Comments:         Anesthesia Quick Evaluation

## 2019-09-12 NOTE — Progress Notes (Signed)
Report called to 5N RN

## 2019-09-12 NOTE — Transfer of Care (Signed)
Immediate Anesthesia Transfer of Care Note  Patient: KIJANA ESTOCK  Procedure(s) Performed: OPEN REDUCTION INTERNAL FIXATION (ORIF) DISTAL FEMUR FRACTURE (Right )  Patient Location: PACU  Anesthesia Type:General  Level of Consciousness: drowsy  Airway & Oxygen Therapy: Patient Spontanous Breathing and Patient connected to nasal cannula oxygen  Post-op Assessment: Report given to RN and Post -op Vital signs reviewed and stable  Post vital signs: Reviewed and stable  Last Vitals:  Vitals Value Taken Time  BP 132/61 09/12/19 1357  Temp    Pulse 95 09/12/19 1401  Resp 8 09/12/19 1401  SpO2 99 % 09/12/19 1401  Vitals shown include unvalidated device data.  Last Pain:  Vitals:   09/12/19 0853  TempSrc:   PainSc: 7          Complications: No apparent anesthesia complications
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
## 2019-09-12 NOTE — Progress Notes (Signed)
Patient transferred to [place] with belongings. Marylou Flesher RN and NT on 5N at patient bedside before I left the unit.

## 2019-09-12 NOTE — Anesthesia Procedure Notes (Signed)
Procedure Name: Intubation Date/Time: 09/12/2019 12:21 PM Performed by: Colin Benton, CRNA Pre-anesthesia Checklist: Patient identified, Emergency Drugs available, Suction available and Patient being monitored Patient Re-evaluated:Patient Re-evaluated prior to induction Oxygen Delivery Method: Circle system utilized Preoxygenation: Pre-oxygenation with 100% oxygen Induction Type: IV induction Ventilation: Mask ventilation without difficulty Laryngoscope Size: Miller and 2 Grade View: Grade II Tube type: Oral Tube size: 7.0 mm Number of attempts: 1 Airway Equipment and Method: Stylet Placement Confirmation: ETT inserted through vocal cords under direct vision,  positive ETCO2 and breath sounds checked- equal and bilateral Secured at: 22 cm Tube secured with: Tape Dental Injury: Teeth and Oropharynx as per pre-operative assessment
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
## 2019-09-13 DIAGNOSIS — S72401A Unspecified fracture of lower end of right femur, initial encounter for closed fracture: Secondary | ICD-10-CM

## 2019-09-13 LAB — CBC
HCT: 23.2 % — ABNORMAL LOW (ref 36.0–46.0)
Hemoglobin: 7.5 g/dL — ABNORMAL LOW (ref 12.0–15.0)
MCH: 30 pg (ref 26.0–34.0)
MCHC: 32.3 g/dL (ref 30.0–36.0)
MCV: 92.8 fL (ref 80.0–100.0)
Platelets: 126 10*3/uL — ABNORMAL LOW (ref 150–400)
RBC: 2.5 MIL/uL — ABNORMAL LOW (ref 3.87–5.11)
RDW: 15.2 % (ref 11.5–15.5)
WBC: 5.9 10*3/uL (ref 4.0–10.5)
nRBC: 0 % (ref 0.0–0.2)

## 2019-09-13 LAB — BASIC METABOLIC PANEL
Anion gap: 8 (ref 5–15)
BUN: 31 mg/dL — ABNORMAL HIGH (ref 8–23)
CO2: 20 mmol/L — ABNORMAL LOW (ref 22–32)
Calcium: 8.1 mg/dL — ABNORMAL LOW (ref 8.9–10.3)
Chloride: 102 mmol/L (ref 98–111)
Creatinine, Ser: 0.98 mg/dL (ref 0.44–1.00)
GFR calc Af Amer: >60 mL/min (ref >60)
GFR calc non Af Amer: 54 mL/min — ABNORMAL LOW (ref >60)
Glucose, Bld: 248 mg/dL — ABNORMAL HIGH (ref 70–99)
Potassium: 5.2 mmol/L — ABNORMAL HIGH (ref 3.5–5.1)
Sodium: 130 mmol/L — ABNORMAL LOW (ref 135–145)

## 2019-09-13 LAB — GLUCOSE, CAPILLARY
Glucose-Capillary: 205 mg/dL — ABNORMAL HIGH (ref 70–99)
Glucose-Capillary: 198 mg/dL — ABNORMAL HIGH (ref 70–99)
Glucose-Capillary: 176 mg/dL — ABNORMAL HIGH (ref 70–99)
Glucose-Capillary: 191 mg/dL — ABNORMAL HIGH (ref 70–99)

## 2019-09-13 LAB — TSH: TSH: 8.265 u[IU]/mL — ABNORMAL HIGH (ref 0.350–4.500)

## 2019-09-13 LAB — MAGNESIUM: Magnesium: 1.7 mg/dL (ref 1.7–2.4)

## 2019-09-13 MED ORDER — INSULIN ASPART 100 UNIT/ML ~~LOC~~ SOLN
0.0000 [IU] | Freq: Three times a day (TID) | SUBCUTANEOUS | Status: DC
  Administered 2019-09-13 – 2019-09-14 (×4): 3 [IU] via SUBCUTANEOUS
  Administered 2019-09-14: 5 [IU] via SUBCUTANEOUS
  Administered 2019-09-15 (×2): 3 [IU] via SUBCUTANEOUS
  Administered 2019-09-15: 5 [IU] via SUBCUTANEOUS
  Administered 2019-09-16: 3 [IU] via SUBCUTANEOUS
  Administered 2019-09-16: 2 [IU] via SUBCUTANEOUS
  Administered 2019-09-16: 3 [IU] via SUBCUTANEOUS
  Administered 2019-09-17: 2 [IU] via SUBCUTANEOUS
  Administered 2019-09-17: 3 [IU] via SUBCUTANEOUS
  Administered 2019-09-17 – 2019-09-18 (×2): 2 [IU] via SUBCUTANEOUS
  Administered 2019-09-18: 5 [IU] via SUBCUTANEOUS
  Administered 2019-09-18 – 2019-09-19 (×2): 2 [IU] via SUBCUTANEOUS
  Administered 2019-09-19: 3 [IU] via SUBCUTANEOUS
  Administered 2019-09-19: 2 [IU] via SUBCUTANEOUS
  Administered 2019-09-20: 3 [IU] via SUBCUTANEOUS
  Administered 2019-09-20: 2 [IU] via SUBCUTANEOUS
  Administered 2019-09-20: 3 [IU] via SUBCUTANEOUS
  Administered 2019-09-21: 5 [IU] via SUBCUTANEOUS
  Administered 2019-09-21 (×2): 2 [IU] via SUBCUTANEOUS
  Administered 2019-09-22 – 2019-09-23 (×2): 3 [IU] via SUBCUTANEOUS
  Administered 2019-09-23: 2 [IU] via SUBCUTANEOUS
  Administered 2019-09-23: 3 [IU] via SUBCUTANEOUS
  Administered 2019-09-24: 2 [IU] via SUBCUTANEOUS
  Administered 2019-09-24: 3 [IU] via SUBCUTANEOUS

## 2019-09-13 MED ORDER — INSULIN GLARGINE 100 UNIT/ML ~~LOC~~ SOLN
4.0000 [IU] | Freq: Every day | SUBCUTANEOUS | Status: DC
  Administered 2019-09-13 – 2019-09-14 (×2): 4 [IU] via SUBCUTANEOUS
  Filled 2019-09-13 (×2): qty 0.04

## 2019-09-13 MED ORDER — BISACODYL 10 MG RE SUPP
10.0000 mg | Freq: Every day | RECTAL | Status: DC | PRN
  Administered 2019-09-16 – 2019-09-20 (×2): 10 mg via RECTAL
  Filled 2019-09-13 (×2): qty 1

## 2019-09-13 MED ORDER — CEFAZOLIN SODIUM-DEXTROSE 2-4 GM/100ML-% IV SOLN
2.0000 g | Freq: Three times a day (TID) | INTRAVENOUS | Status: AC
  Administered 2019-09-13 (×2): 2 g via INTRAVENOUS
  Filled 2019-09-13 (×2): qty 100

## 2019-09-13 MED ORDER — MUPIROCIN 2 % EX OINT
1.0000 "application " | TOPICAL_OINTMENT | Freq: Two times a day (BID) | CUTANEOUS | Status: AC
  Administered 2019-09-13 – 2019-09-17 (×10): 1 via NASAL
  Filled 2019-09-13: qty 22

## 2019-09-13 MED ORDER — VANCOMYCIN HCL 1250 MG/250ML IV SOLN
1250.0000 mg | INTRAVENOUS | Status: DC
  Filled 2019-09-13: qty 250

## 2019-09-13 MED ORDER — INSULIN ASPART 100 UNIT/ML ~~LOC~~ SOLN
0.0000 [IU] | Freq: Every day | SUBCUTANEOUS | Status: DC
  Administered 2019-09-15: 2 [IU] via SUBCUTANEOUS

## 2019-09-13 MED ORDER — HEPARIN SODIUM (PORCINE) 1000 UNIT/ML IJ SOLN: 25000.0000 [IU] | INTRAMUSCULAR | Status: DC

## 2019-09-13 MED ORDER — OXYCODONE HCL 5 MG PO TABS: 5.0000 mg | ORAL_TABLET | Freq: Three times a day (TID) | ORAL | 0 refills | Status: DC | PRN

## 2019-09-13 MED ORDER — HEPARIN (PORCINE) 25000 UT/250ML-% IV SOLN
1050.0000 [IU]/h | INTRAVENOUS | Status: DC
  Administered 2019-09-13: 16:00:00 1100 [IU]/h via INTRAVENOUS
  Administered 2019-09-14: 1050 [IU]/h via INTRAVENOUS
  Filled 2019-09-13 (×2): qty 250
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
## 2019-09-13 NOTE — Progress Notes (Signed)
Hemoglobin stable, no indication for further transfusion at this time. Management per primary. Trauma will sign off.  Wellington Hampshire, [facility] 09/13/2019, 11:45 AM Please see Amion for pager number during day hours 7:00am-4:30pm

## 2019-09-13 NOTE — Progress Notes (Signed)
Pt is confused and calm. Refused to be repositioned to sides. Explained to pt the possibility of developing pressure sores. Pillow was placed under her right leg but does not want [name] to move her to her sides.
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
## 2019-09-14 LAB — CBC
HCT: 24.3 % — ABNORMAL LOW (ref 36.0–46.0)
Hemoglobin: 8.1 g/dL — ABNORMAL LOW (ref 12.0–15.0)
MCH: 30.3 pg (ref 26.0–34.0)
MCHC: 33.3 g/dL (ref 30.0–36.0)
MCV: 91 fL (ref 80.0–100.0)
Platelets: 155 10*3/uL (ref 150–400)
RBC: 2.67 MIL/uL — ABNORMAL LOW (ref 3.87–5.11)
RDW: 16.1 % — ABNORMAL HIGH (ref 11.5–15.5)
WBC: 6.5 10*3/uL (ref 4.0–10.5)
nRBC: 0 % (ref 0.0–0.2)

## 2019-09-14 LAB — BASIC METABOLIC PANEL
Anion gap: 9 (ref 5–15)
BUN: 30 mg/dL — ABNORMAL HIGH (ref 8–23)
CO2: 20 mmol/L — ABNORMAL LOW (ref 22–32)
Calcium: 8.4 mg/dL — ABNORMAL LOW (ref 8.9–10.3)
Chloride: 100 mmol/L (ref 98–111)
Creatinine, Ser: 1 mg/dL (ref 0.44–1.00)
GFR calc Af Amer: >60 mL/min (ref >60)
GFR calc non Af Amer: 52 mL/min — ABNORMAL LOW (ref >60)
Glucose, Bld: 207 mg/dL — ABNORMAL HIGH (ref 70–99)
Potassium: 4.7 mmol/L (ref 3.5–5.1)
Sodium: 129 mmol/L — ABNORMAL LOW (ref 135–145)

## 2019-09-14 LAB — HEPARIN LEVEL (UNFRACTIONATED)
Heparin Unfractionated: 0.35 IU/mL (ref 0.30–0.70)
Heparin Unfractionated: 0.56 IU/mL (ref 0.30–0.70)
Heparin Unfractionated: 0.49 IU/mL (ref 0.30–0.70)

## 2019-09-14 LAB — T4, FREE: Free T4: 0.98 ng/dL (ref 0.61–1.12)

## 2019-09-14 LAB — GLUCOSE, CAPILLARY
Glucose-Capillary: 162 mg/dL — ABNORMAL HIGH (ref 70–99)
Glucose-Capillary: 174 mg/dL — ABNORMAL HIGH (ref 70–99)
Glucose-Capillary: 194 mg/dL — ABNORMAL HIGH (ref 70–99)
Glucose-Capillary: 204 mg/dL — ABNORMAL HIGH (ref 70–99)
Glucose-Capillary: 241 mg/dL — ABNORMAL HIGH (ref 70–99)

## 2019-09-14 LAB — MAGNESIUM: Magnesium: 1.7 mg/dL (ref 1.7–2.4)

## 2019-09-14 LAB — PHOSPHORUS: Phosphorus: 2.4 mg/dL — ABNORMAL LOW (ref 2.5–4.6)

## 2019-09-14 MED ORDER — MAGNESIUM SULFATE 2 GM/50ML IV SOLN
2.0000 g | Freq: Once | INTRAVENOUS | Status: AC
  Administered 2019-09-14: 2 g via INTRAVENOUS
  Filled 2019-09-14: qty 50

## 2019-09-14 MED ORDER — INSULIN GLARGINE 100 UNIT/ML ~~LOC~~ SOLN
10.0000 [IU] | Freq: Every day | SUBCUTANEOUS | Status: DC
  Administered 2019-09-15 – 2019-09-24 (×10): 10 [IU] via SUBCUTANEOUS
  Filled 2019-09-14 (×10): qty 0.1

## 2019-09-14 MED ORDER — INSULIN GLARGINE 100 UNIT/ML ~~LOC~~ SOLN
6.0000 [IU] | Freq: Once | SUBCUTANEOUS | Status: AC
  Administered 2019-09-14: 6 [IU] via SUBCUTANEOUS
  Filled 2019-09-14: qty 0.06
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
## 2019-09-14 NOTE — Progress Notes (Signed)
ANTICOAGULATION CONSULT NOTE - Follow Up Consult  Pharmacy Consult for heparin Indication: DVT  Labs: Recent Labs    09/11/19 0237 09/11/19 0237 09/12/19 0315 09/12/19 0315 09/12/19 1528 09/13/19 0253 09/13/19 2333  HGB 7.1*   < > 8.2*   < > 8.1* 7.5*  --   HCT 21.5*   < > 24.5*  --  25.2* 23.2*  --   PLT 96*   < > 126*  --  141* 126*  --   LABPROT 15.4*  --   --   --   --   --   --   INR 1.3*  --   --   --   --   --   --   HEPARINUNFRC 0.42  --   --   --   --   --  0.35  CREATININE 0.96   < > 1.02*  --  1.04* 0.98  --    < > = values in this interval not displayed.    Assessment/Plan:  85yo female therapeutic on heparin after resumed. Will continue gtt at current rate and confirm stable with additional level.   Wynona Neat, PharmD, BCPS  09/14/2019,2:01 AM
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
## 2019-09-15 ENCOUNTER — Other Ambulatory Visit: Payer: Self-pay | Admitting: Physician Assistant

## 2019-09-15 DIAGNOSIS — S82101A Unspecified fracture of upper end of right tibia, initial encounter for closed fracture: Secondary | ICD-10-CM | POA: Diagnosis present

## 2019-09-15 DIAGNOSIS — I82409 Acute embolism and thrombosis of unspecified deep veins of unspecified lower extremity: Secondary | ICD-10-CM

## 2019-09-15 DIAGNOSIS — E119 Type 2 diabetes mellitus without complications: Secondary | ICD-10-CM

## 2019-09-15 DIAGNOSIS — E11621 Type 2 diabetes mellitus with foot ulcer: Secondary | ICD-10-CM | POA: Diagnosis present

## 2019-09-15 DIAGNOSIS — R571 Hypovolemic shock: Secondary | ICD-10-CM

## 2019-09-15 DIAGNOSIS — I509 Heart failure, unspecified: Secondary | ICD-10-CM

## 2019-09-15 LAB — BASIC METABOLIC PANEL
Anion gap: 10 (ref 5–15)
BUN: 28 mg/dL — ABNORMAL HIGH (ref 8–23)
CO2: 19 mmol/L — ABNORMAL LOW (ref 22–32)
Calcium: 8.3 mg/dL — ABNORMAL LOW (ref 8.9–10.3)
Chloride: 98 mmol/L (ref 98–111)
Creatinine, Ser: 0.9 mg/dL (ref 0.44–1.00)
GFR calc Af Amer: >60 mL/min (ref >60)
GFR calc non Af Amer: 60 mL/min — ABNORMAL LOW (ref >60)
Glucose, Bld: 192 mg/dL — ABNORMAL HIGH (ref 70–99)
Potassium: 4.6 mmol/L (ref 3.5–5.1)
Sodium: 127 mmol/L — ABNORMAL LOW (ref 135–145)

## 2019-09-15 LAB — OSMOLALITY, URINE: Osmolality, Ur: 739 mOsm/kg (ref 300–900)

## 2019-09-15 LAB — CBC
HCT: 22.9 % — ABNORMAL LOW (ref 36.0–46.0)
Hemoglobin: 7.7 g/dL — ABNORMAL LOW (ref 12.0–15.0)
MCH: 30.6 pg (ref 26.0–34.0)
MCHC: 33.6 g/dL (ref 30.0–36.0)
MCV: 90.9 fL (ref 80.0–100.0)
Platelets: 154 10*3/uL (ref 150–400)
RBC: 2.52 MIL/uL — ABNORMAL LOW (ref 3.87–5.11)
RDW: 16.3 % — ABNORMAL HIGH (ref 11.5–15.5)
WBC: 4.9 10*3/uL (ref 4.0–10.5)
nRBC: 0 % (ref 0.0–0.2)

## 2019-09-15 LAB — GLUCOSE, CAPILLARY
Glucose-Capillary: 195 mg/dL — ABNORMAL HIGH (ref 70–99)
Glucose-Capillary: 235 mg/dL — ABNORMAL HIGH (ref 70–99)
Glucose-Capillary: 181 mg/dL — ABNORMAL HIGH (ref 70–99)
Glucose-Capillary: 203 mg/dL — ABNORMAL HIGH (ref 70–99)

## 2019-09-15 LAB — SODIUM: Sodium: 126 mmol/L — ABNORMAL LOW (ref 135–145)

## 2019-09-15 LAB — NA AND K (SODIUM & POTASSIUM), RAND UR
Potassium Urine: 47 mmol/L
Sodium, Ur: <10 mmol/L

## 2019-09-15 LAB — HEPARIN LEVEL (UNFRACTIONATED): Heparin Unfractionated: 0.5 IU/mL (ref 0.30–0.70)

## 2019-09-15 LAB — OSMOLALITY: Osmolality: 282 mOsm/kg (ref 275–295)

## 2019-09-15 MED ORDER — SODIUM CHLORIDE 1 G PO TABS
1.0000 g | ORAL_TABLET | Freq: Three times a day (TID) | ORAL | Status: DC
  Administered 2019-09-15 – 2019-09-24 (×28): 1 g via ORAL
  Filled 2019-09-15 (×29): qty 1

## 2019-09-15 MED ORDER — APIXABAN 5 MG PO TABS
10.0000 mg | ORAL_TABLET | Freq: Two times a day (BID) | ORAL | Status: AC
  Administered 2019-09-15 – 2019-09-21 (×14): 10 mg via ORAL
  Filled 2019-09-15 (×14): qty 2

## 2019-09-15 MED ORDER — SODIUM CHLORIDE 0.9 % IV SOLN: INTRAVENOUS | Status: DC

## 2019-09-15 MED ORDER — APIXABAN 5 MG PO TABS
5.0000 mg | ORAL_TABLET | Freq: Two times a day (BID) | ORAL | Status: DC
  Administered 2019-09-22 – 2019-09-24 (×5): 5 mg via ORAL
  Filled 2019-09-15 (×5): qty 1
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
## 2019-09-15 NOTE — Plan of Care (Signed)

## 2019-09-15 NOTE — Discharge Instructions (Signed)
Orthopaedic Trauma Service Discharge Instructions   General Discharge Instructions  WEIGHT BEARING STATUS: Touchdown weightbearing right lower extremity  RANGE OF MOTION/ACTIVITY: Okay for unrestricted range of motion of the knee  Wound Care: Incisions can be left open to air if there is no drainage. If incision continues to have drainage, follow wound care instructions below. Okay to shower if no drainage from incisions.  Diet: as you were eating previously.  Can use over the counter stool softeners and bowel preparations, such as Miralax, to help with bowel movements.  Narcotics can be constipating.  Be sure to drink plenty of fluids  PAIN MEDICATION USE AND EXPECTATIONS  You have likely been given narcotic medications to help control your pain.  After a traumatic event that results in an fracture (broken bone) with or without surgery, it is ok to use narcotic pain medications to help control one's pain.  We understand that everyone responds to pain differently and each individual patient will be evaluated on a regular basis for the continued need for narcotic medications. Ideally, narcotic medication use should last no more than 6-8 weeks (coinciding with fracture healing).   As a patient it is your responsibility as well to monitor narcotic medication use and report the amount and frequency you use these medications when you come to your office visit.   We would also advise that if you are using narcotic medications, you should take a dose prior to therapy to maximize you participation.  IF YOU ARE ON NARCOTIC MEDICATIONS IT IS NOT PERMISSIBLE TO OPERATE A MOTOR VEHICLE (MOTORCYCLE/CAR/TRUCK/MOPED) OR HEAVY MACHINERY DO NOT MIX NARCOTICS WITH OTHER CNS (CENTRAL NERVOUS SYSTEM) DEPRESSANTS SUCH AS ALCOHOL   STOP SMOKING OR USING NICOTINE PRODUCTS!!!!  As discussed nicotine severely impairs your body's ability to heal surgical and traumatic wounds but also impairs bone healing.  Wounds  and bone heal by forming microscopic blood vessels (angiogenesis) and nicotine is a vasoconstrictor (essentially, shrinks blood vessels).  Therefore, if vasoconstriction occurs to these microscopic blood vessels they essentially disappear and are unable to deliver necessary nutrients to the healing tissue.  This is one modifiable factor that you can do to dramatically increase your chances of healing your injury.    (This means no smoking, no nicotine gum, patches, etc)  DO NOT USE NONSTEROIDAL ANTI-INFLAMMATORY DRUGS (NSAID'S)  Using products such as Advil (ibuprofen), Aleve (naproxen), Motrin (ibuprofen) for additional pain control during fracture healing can delay and/or prevent the healing response.  If you would like to take over the counter (OTC) medication, Tylenol (acetaminophen) is ok.  However, some narcotic medications that are given for pain control contain acetaminophen as well. Therefore, you should not exceed more than 4000 mg of tylenol in a day if you do not have liver disease.  Also note that there are may OTC medicines, such as cold medicines and allergy medicines that my contain tylenol as well.  If you have any questions about medications and/or interactions please ask your doctor/PA or your pharmacist.      ICE AND ELEVATE INJURED/OPERATIVE EXTREMITY  Using ice and elevating the injured extremity above your heart can help with swelling and pain control.  Icing in a pulsatile fashion, such as 20 minutes on and 20 minutes off, can be followed.    Do not place ice directly on skin. Make sure there is a barrier between to skin and the ice pack.    Using frozen items such as frozen peas works well as the conform  nicely to the are that needs to be iced.  USE AN ACE WRAP OR TED HOSE FOR SWELLING CONTROL  In addition to icing and elevation, Ace wraps or TED hose are used to help limit and resolve swelling.  It is recommended to use Ace wraps or TED hose until you are informed to stop.     When using Ace Wraps start the wrapping distally (farthest away from the body) and wrap proximally (closer to the body)   Example: If you had surgery on your leg or thing and you do not have a splint on, start the ace wrap at the toes and work your way up to the thigh        If you had surgery on your upper extremity and do not have a splint on, start the ace wrap at your fingers and work your way up to the upper arm  IF YOU ARE IN A SPLINT OR CAST DO NOT Pana   If your splint gets wet for any reason please contact the office immediately. You may shower in your splint or cast as long as you keep it dry.  This can be done by wrapping in a cast cover or garbage back (or similar)  Do Not stick any thing down your splint or cast such as pencils, money, or hangers to try and scratch yourself with.  If you feel itchy take benadryl as prescribed on the bottle for itching  IF YOU ARE IN A CAM BOOT (BLACK BOOT)  You may remove boot periodically. Perform daily dressing changes as noted below.  Wash the liner of the boot regularly and wear a sock when wearing the boot. It is recommended that you sleep in the boot until told otherwise   CALL THE OFFICE WITH ANY QUESTIONS OR CONCERNS: [phone]   VISIT OUR WEBSITE FOR ADDITIONAL INFORMATION: [email or web address]      Discharge Pin Site Instructions  Dress pins daily with Kerlix roll starting on POD 2. Wrap the Kerlix so that it tamps the skin down around the pin-skin interface to prevent/limit motion of the skin relative to the pin.  (Pin-skin motion is the primary cause of pain and infection related to external fixator pin sites).  Remove any crust or coagulum that may obstruct drainage with a saline moistened gauze or soap and water.  After POD 3, if there is no discernable drainage on the pin site dressing, the interval for change can by increased to every other day.  You may shower with the fixator, cleaning all pin sites  gently with soap and water.  If you have a surgical wound this needs to be completely dry and without drainage before showering.  The extremity can be lifted by the fixator to facilitate wound care and transfers.  Notify the office/Doctor if you experience increasing drainage, redness, or pain from a pin site, or if you notice purulent (thick, snot-like) drainage.  Discharge Wound Care Instructions  Do NOT apply any ointments, solutions or lotions to pin sites or surgical wounds.  These prevent needed drainage and even though solutions like hydrogen peroxide kill bacteria, they also damage cells lining the pin sites that help fight infection.  Applying lotions or ointments can keep the wounds moist and can cause them to breakdown and open up as well. This can increase the risk for infection. When in doubt call the office.  Surgical incisions should be dressed daily.  If any drainage is noted, use one layer  of adaptic, then gauze, Kerlix, and an ace wrap.  Once the incision is completely dry and without drainage, it may be left open to air out.  Showering may begin 36-48 hours later.  Cleaning gently with soap and water.  Traumatic wounds should be dressed daily as well.    One layer of adaptic, gauze, Kerlix, then ace wrap.  The adaptic can be discontinued once the draining has ceased    If you have a wet to dry dressing: wet the gauze with saline the squeeze as much saline out so the gauze is moist (not soaking wet), place moistened gauze over wound, then place a dry gauze over the moist one, followed by Kerlix wrap, then ace wrap.   Information on my medicine - ELIQUIS (apixaban)  This medication education was reviewed with me or my healthcare representative as part of my discharge preparation.    Why was Eliquis prescribed for you? Eliquis was prescribed to treat blood clots that may have been found in the veins of your legs (deep vein thrombosis) or in your lungs (pulmonary  embolism) and to reduce the risk of them occurring again.  What do You need to know about Eliquis ? The starting dose is 10 mg (two 5 mg tablets) taken TWICE daily for the FIRST SEVEN (7) DAYS, then on  09/22/2019  the dose is reduced to ONE 5 mg tablet taken TWICE daily.  Eliquis may be taken with or without food.   Try to take the dose about the same time in the morning and in the evening. If you have difficulty swallowing the tablet whole please discuss with your pharmacist how to take the medication safely.  Take Eliquis exactly as prescribed and DO NOT stop taking Eliquis without talking to the doctor who prescribed the medication.  Stopping may increase your risk of developing a new blood clot.  Refill your prescription before you run out.  After discharge, you should have regular check-up appointments with your healthcare provider that is prescribing your Eliquis.    What do you do if you miss a dose? If a dose of ELIQUIS is not taken at the scheduled time, take it as soon as possible on the same day and twice-daily administration should be resumed. The dose should not be doubled to make up for a missed dose.  Important Safety Information A possible side effect of Eliquis is bleeding. You should call your healthcare provider right away if you experience any of the following: ? Bleeding from an injury or your nose that does not stop. ? Unusual colored urine (red or dark brown) or unusual colored stools (red or black). ? Unusual bruising for unknown reasons. ? A serious fall or if you hit your head (even if there is no bleeding).  Some medicines may interact with Eliquis and might increase your risk of bleeding or clotting while on Eliquis. To help avoid this, consult your healthcare provider or pharmacist prior to using any new prescription or non-prescription medications, including herbals, vitamins, non-steroidal anti-inflammatory drugs (NSAIDs) and supplements.  This website  has more information on Eliquis (apixaban): http://www.eliquis.com/eliquis/home
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
## 2019-09-16 ENCOUNTER — Inpatient Hospital Stay (HOSPITAL_COMMUNITY): Payer: Medicare Other

## 2019-09-16 LAB — HEPATIC FUNCTION PANEL
ALT: 32 U/L (ref 0–44)
AST: 50 U/L — ABNORMAL HIGH (ref 15–41)
Albumin: 2.3 g/dL — ABNORMAL LOW (ref 3.5–5.0)
Alkaline Phosphatase: 169 U/L — ABNORMAL HIGH (ref 38–126)
Bilirubin, Direct: 0.6 mg/dL — ABNORMAL HIGH (ref 0.0–0.2)
Indirect Bilirubin: 1.6 mg/dL — ABNORMAL HIGH (ref 0.3–0.9)
Total Bilirubin: 2.2 mg/dL — ABNORMAL HIGH (ref 0.3–1.2)
Total Protein: 5.4 g/dL — ABNORMAL LOW (ref 6.5–8.1)

## 2019-09-16 LAB — IRON AND TIBC
Iron: 38 ug/dL (ref 28–170)
Saturation Ratios: 12 % (ref 10.4–31.8)
TIBC: 323 ug/dL (ref 250–450)
UIBC: 285 ug/dL

## 2019-09-16 LAB — BASIC METABOLIC PANEL
Anion gap: 5 (ref 5–15)
BUN: 26 mg/dL — ABNORMAL HIGH (ref 8–23)
CO2: 21 mmol/L — ABNORMAL LOW (ref 22–32)
Calcium: 8.1 mg/dL — ABNORMAL LOW (ref 8.9–10.3)
Chloride: 102 mmol/L (ref 98–111)
Creatinine, Ser: 0.88 mg/dL (ref 0.44–1.00)
GFR calc Af Amer: >60 mL/min (ref >60)
GFR calc non Af Amer: >60 mL/min (ref >60)
Glucose, Bld: 156 mg/dL — ABNORMAL HIGH (ref 70–99)
Potassium: 4.8 mmol/L (ref 3.5–5.1)
Sodium: 128 mmol/L — ABNORMAL LOW (ref 135–145)

## 2019-09-16 LAB — CBC
HCT: 24 % — ABNORMAL LOW (ref 36.0–46.0)
Hemoglobin: 7.8 g/dL — ABNORMAL LOW (ref 12.0–15.0)
MCH: 30.1 pg (ref 26.0–34.0)
MCHC: 32.5 g/dL (ref 30.0–36.0)
MCV: 92.7 fL (ref 80.0–100.0)
Platelets: 167 10*3/uL (ref 150–400)
RBC: 2.59 MIL/uL — ABNORMAL LOW (ref 3.87–5.11)
RDW: 16.7 % — ABNORMAL HIGH (ref 11.5–15.5)
WBC: 4.6 10*3/uL (ref 4.0–10.5)
nRBC: 0 % (ref 0.0–0.2)

## 2019-09-16 LAB — GLUCOSE, CAPILLARY
Glucose-Capillary: 145 mg/dL — ABNORMAL HIGH (ref 70–99)
Glucose-Capillary: 154 mg/dL — ABNORMAL HIGH (ref 70–99)
Glucose-Capillary: 169 mg/dL — ABNORMAL HIGH (ref 70–99)
Glucose-Capillary: 152 mg/dL — ABNORMAL HIGH (ref 70–99)

## 2019-09-16 LAB — AMMONIA: Ammonia: 24 umol/L (ref 9–35)

## 2019-09-16 LAB — VITAMIN B12: Vitamin B-12: 1467 pg/mL — ABNORMAL HIGH (ref 180–914)

## 2019-09-16 LAB — FERRITIN: Ferritin: 65 ng/mL (ref 11–307)

## 2019-09-16 MED ORDER — FERROUS SULFATE 325 (65 FE) MG PO TABS
325.0000 mg | ORAL_TABLET | Freq: Every day | ORAL | Status: DC
  Administered 2019-09-16 – 2019-09-24 (×9): 325 mg via ORAL
  Filled 2019-09-16 (×9): qty 1

## 2019-09-16 MED ORDER — BISACODYL 5 MG PO TBEC
5.0000 mg | DELAYED_RELEASE_TABLET | Freq: Every day | ORAL | Status: DC | PRN
  Administered 2019-09-16: 5 mg via ORAL
  Filled 2019-09-16: qty 1

## 2019-09-16 MED ORDER — FLEET ENEMA 7-19 GM/118ML RE ENEM: 1.0000 | ENEMA | Freq: Once | RECTAL | Status: DC

## 2019-09-16 MED ORDER — POLYETHYLENE GLYCOL 3350 17 G PO PACK
17.0000 g | PACK | Freq: Two times a day (BID) | ORAL | Status: DC
  Administered 2019-09-16 – 2019-09-23 (×13): 17 g via ORAL
  Filled 2019-09-16 (×15): qty 1

## 2019-09-16 MED ORDER — SENNOSIDES-DOCUSATE SODIUM 8.6-50 MG PO TABS
2.0000 | ORAL_TABLET | Freq: Two times a day (BID) | ORAL | Status: DC
  Administered 2019-09-16 – 2019-09-24 (×17): 2 via ORAL
  Filled 2019-09-16 (×17): qty 2

## 2019-09-16 NOTE — Progress Notes (Signed)
1820 Pt had vomited 3x this evening. Abd is distended but soft. No BM for 5 days after giving laxatives daily. Dr Nevada Crane notified, ordered abd xray.
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
## 2019-09-16 NOTE — Progress Notes (Signed)
Called radiology for patient abdominal xray but it went to voicemail, pt had a medium bowel movement but still impacted, was told by the day shift nurse to hold the fleet enema till abdominal xray complete, will  Administer the schedule laxatives and continue to monitor patient till further notice.

## 2019-09-16 NOTE — Plan of Care (Signed)

## 2019-09-17 ENCOUNTER — Encounter (HOSPITAL_COMMUNITY): Payer: Self-pay | Admitting: Pulmonary Disease

## 2019-09-17 ENCOUNTER — Other Ambulatory Visit: Payer: Self-pay

## 2019-09-17 ENCOUNTER — Encounter (HOSPITAL_BASED_OUTPATIENT_CLINIC_OR_DEPARTMENT_OTHER): Payer: Medicare Other | Admitting: Internal Medicine

## 2019-09-17 LAB — PHOSPHORUS: Phosphorus: 3.7 mg/dL (ref 2.5–4.6)

## 2019-09-17 LAB — CBC
HCT: 25.1 % — ABNORMAL LOW (ref 36.0–46.0)
Hemoglobin: 8.1 g/dL — ABNORMAL LOW (ref 12.0–15.0)
MCH: 30.6 pg (ref 26.0–34.0)
MCHC: 32.3 g/dL (ref 30.0–36.0)
MCV: 94.7 fL (ref 80.0–100.0)
Platelets: 207 10*3/uL (ref 150–400)
RBC: 2.65 MIL/uL — ABNORMAL LOW (ref 3.87–5.11)
RDW: 17.7 % — ABNORMAL HIGH (ref 11.5–15.5)
WBC: 6.8 10*3/uL (ref 4.0–10.5)
nRBC: 0 % (ref 0.0–0.2)

## 2019-09-17 LAB — BASIC METABOLIC PANEL
Anion gap: 9 (ref 5–15)
BUN: 33 mg/dL — ABNORMAL HIGH (ref 8–23)
CO2: 19 mmol/L — ABNORMAL LOW (ref 22–32)
Calcium: 8.5 mg/dL — ABNORMAL LOW (ref 8.9–10.3)
Chloride: 98 mmol/L (ref 98–111)
Creatinine, Ser: 1.18 mg/dL — ABNORMAL HIGH (ref 0.44–1.00)
GFR calc Af Amer: 50 mL/min — ABNORMAL LOW (ref >60)
GFR calc non Af Amer: 43 mL/min — ABNORMAL LOW (ref >60)
Glucose, Bld: 175 mg/dL — ABNORMAL HIGH (ref 70–99)
Potassium: 5.1 mmol/L (ref 3.5–5.1)
Sodium: 126 mmol/L — ABNORMAL LOW (ref 135–145)

## 2019-09-17 LAB — GLUCOSE, CAPILLARY
Glucose-Capillary: 175 mg/dL — ABNORMAL HIGH (ref 70–99)
Glucose-Capillary: 142 mg/dL — ABNORMAL HIGH (ref 70–99)
Glucose-Capillary: 133 mg/dL — ABNORMAL HIGH (ref 70–99)
Glucose-Capillary: 125 mg/dL — ABNORMAL HIGH (ref 70–99)

## 2019-09-17 LAB — MAGNESIUM: Magnesium: 1.9 mg/dL (ref 1.7–2.4)

## 2019-09-17 LAB — FOLATE RBC
Folate, Hemolysate: 386 ng/mL
Folate, RBC: 1588 ng/mL (ref >498)
Hematocrit: 24.3 % — ABNORMAL LOW (ref 34.0–46.6)

## 2019-09-17 MED ORDER — SORBITOL 70 % SOLN
960.0000 mL | TOPICAL_OIL | Freq: Once | ORAL | Status: DC
  Filled 2019-09-17 (×3): qty 473

## 2019-09-17 NOTE — Progress Notes (Signed)
Still waiting on pharmacy to send the SMOG enema, gave day shift nurse report and update and she has also called pharmacy again, both tube station checked, no enema found, , patient slept well after the second bowel movement this shift and the last time she vomited was around 2230.will continue to monitor
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
## 2019-09-17 NOTE — Progress Notes (Signed)
Inpatient Rehab Admissions:  Inpatient Rehab Consult received.  I met with patient at the bedside for rehabilitation assessment and to discuss goals and expectations of an inpatient rehab admission.  Pt very confused throughout my discussion with her.  Repeatedly talking about therapy she received after her knee surgery >10 years ago and unable to remain oriented to current recommendations for CIR.  Based on chart review from previous admissions, this appears to be a consistent trend.  Unsure whether related to medical or baseline cognitive deficits.  I do think that pt is medically and functionally appropriate for CIR and, based on her participation with therapy today, I do think she can tolerate CIR.  Discussed with CM who has spoken with the husband, who is refusing SNF in favor of home.  I have left him a message to discuss if he would be agreeable to CIR.  Will continue to follow.   Signed: Shann Medal, PT, DPT Admissions Coordinator [phone] 09/17/19  3:30 PM
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
## 2019-09-17 NOTE — Plan of Care (Signed)
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
## 2019-09-18 ENCOUNTER — Inpatient Hospital Stay (HOSPITAL_COMMUNITY): Payer: Medicare Other

## 2019-09-18 DIAGNOSIS — N179 Acute kidney failure, unspecified: Secondary | ICD-10-CM

## 2019-09-18 DIAGNOSIS — E1169 Type 2 diabetes mellitus with other specified complication: Secondary | ICD-10-CM

## 2019-09-18 DIAGNOSIS — I824Z3 Acute embolism and thrombosis of unspecified deep veins of distal lower extremity, bilateral: Secondary | ICD-10-CM

## 2019-09-18 LAB — SODIUM, URINE, RANDOM: Sodium, Ur: <10 mmol/L

## 2019-09-18 LAB — CBC
HCT: 25.7 % — ABNORMAL LOW (ref 36.0–46.0)
Hemoglobin: 8.2 g/dL — ABNORMAL LOW (ref 12.0–15.0)
MCH: 30.1 pg (ref 26.0–34.0)
MCHC: 31.9 g/dL (ref 30.0–36.0)
MCV: 94.5 fL (ref 80.0–100.0)
Platelets: 208 10*3/uL (ref 150–400)
RBC: 2.72 MIL/uL — ABNORMAL LOW (ref 3.87–5.11)
RDW: 18.4 % — ABNORMAL HIGH (ref 11.5–15.5)
WBC: 5.9 10*3/uL (ref 4.0–10.5)
nRBC: 0 % (ref 0.0–0.2)

## 2019-09-18 LAB — URINALYSIS, ROUTINE W REFLEX MICROSCOPIC
Glucose, UA: NEGATIVE mg/dL
Hgb urine dipstick: NEGATIVE
Ketones, ur: NEGATIVE mg/dL
Nitrite: NEGATIVE
Protein, ur: NEGATIVE mg/dL
Specific Gravity, Urine: 1.028 (ref 1.005–1.030)
pH: 5 (ref 5.0–8.0)

## 2019-09-18 LAB — GLUCOSE, CAPILLARY
Glucose-Capillary: 131 mg/dL — ABNORMAL HIGH (ref 70–99)
Glucose-Capillary: 205 mg/dL — ABNORMAL HIGH (ref 70–99)
Glucose-Capillary: 143 mg/dL — ABNORMAL HIGH (ref 70–99)
Glucose-Capillary: 134 mg/dL — ABNORMAL HIGH (ref 70–99)

## 2019-09-18 LAB — BASIC METABOLIC PANEL
Anion gap: 10 (ref 5–15)
BUN: 38 mg/dL — ABNORMAL HIGH (ref 8–23)
CO2: 19 mmol/L — ABNORMAL LOW (ref 22–32)
Calcium: 8.7 mg/dL — ABNORMAL LOW (ref 8.9–10.3)
Chloride: 99 mmol/L (ref 98–111)
Creatinine, Ser: 1.3 mg/dL — ABNORMAL HIGH (ref 0.44–1.00)
GFR calc Af Amer: 44 mL/min — ABNORMAL LOW (ref >60)
GFR calc non Af Amer: 38 mL/min — ABNORMAL LOW (ref >60)
Glucose, Bld: 126 mg/dL — ABNORMAL HIGH (ref 70–99)
Potassium: 4.8 mmol/L (ref 3.5–5.1)
Sodium: 128 mmol/L — ABNORMAL LOW (ref 135–145)

## 2019-09-18 LAB — CREATININE, URINE, RANDOM: Creatinine, Urine: 152.84 mg/dL

## 2019-09-18 MED ORDER — SODIUM CHLORIDE 0.9 % IV SOLN: INTRAVENOUS | Status: DC

## 2019-09-18 MED ORDER — FUROSEMIDE 10 MG/ML IJ SOLN
20.0000 mg | Freq: Once | INTRAMUSCULAR | Status: AC
  Administered 2019-09-18: 20 mg via INTRAVENOUS
  Filled 2019-09-18: qty 2
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
## 2019-09-18 NOTE — Plan of Care (Signed)
  Problem: Education: Goal: Knowledge of General Education information will improve Description: Including pain rating scale, medication(s)/side effects and non-pharmacologic comfort measures Outcome: Progressing   Problem: Health Behavior/Discharge Planning: Goal: Ability to manage health-related needs will improve Outcome: Progressing   Problem: Elimination: Goal: Will not experience complications related to bowel motility Outcome: Progressing   Problem: Pain Managment: Goal: General experience of comfort will improve Outcome: Progressing   Problem: Skin Integrity: Goal: Risk for impaired skin integrity will decrease Outcome: Progressing

## 2019-09-18 NOTE — Progress Notes (Signed)
Inpatient Rehab Admissions Coordinator:   I was able to speak to pt's husband.  He states that he does work Risk manager at [employer] for 3-4 hrs/day, but that he would consider hiring additional assist at home during these times if it was recommended that she have 24/7 supervision/assist.  I did introduce the idea of a ramp for home entry and w/c level mobility at discharge until able to put weight through RLE.  Will work towards possible admission in the next few days pending bed availability.   Shann Medal, PT, DPT Admissions Coordinator [phone] 09/18/19  3:53 PM

## 2019-09-18 NOTE — Plan of Care (Signed)

## 2019-09-18 NOTE — Progress Notes (Signed)
ANTICOAGULATION CONSULT NOTE - Follow Up Consult  Pharmacy Consult for Eliquis Indication: DVT  Allergies  Allergen Reactions  . Demerol [Meperidine] Nausea And Vomiting  . Penicillins Swelling    approx 85 years old    Patient Measurements: Height: 5' 7.01" (170.2 cm) Weight: 84.9 kg (187 lb 2.7 oz) IBW/kg (Calculated) : 61.62  Vital Signs: Temp: 97.8 F (36.6 C) (05/05 0336) Temp Source: Oral (05/05 0336) BP: 119/68 (05/05 0336) Pulse Rate: 73 (05/05 0336)  Labs: Recent Labs    09/16/19 0305 09/16/19 0305 09/16/19 0523 09/17/19 0653 09/18/19 0617  HGB 7.8*   < >  --  8.1* 8.2*  HCT 24.0*  --  24.3* 25.1* 25.7*  PLT 167  --   --  207 208  CREATININE 0.88  --   --  1.18* 1.30*   < > = values in this interval not displayed.    Estimated Creatinine Clearance: 37.3 mL/min (A) (by C-G formula based on SCr of 1.3 mg/dL (H)).  Assessment: Anticoag: no DVT proph > bilateral DVTs>enox>hep gtt> now apixaban starting 5/2. Retrievable IVC filter placement on 09/11/19. Interventional radiology to assess for possible retrieval of IVC filter in the next 8 weeks - Hgb 8.2 low but stable.  Goal of Therapy:  Therapeutic oral anticoagulation   Plan:  - Start apixaban 85m PO bid for 7 days then (on 09/22/2019) decrease to 560mPO bid - Monitoring Na, LFT's, and rising Scr - f/u resuming DAPT for h/o CVA and other home meds    Lillar Bianca S. RoAlford HighlandPharmD, BCPS Clinical Staff Pharmacist [email or web address] RoAlford HighlandCrThe Timken Company/09/2019,8:31 AM
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
## 2019-09-18 NOTE — Progress Notes (Signed)
Orthopedic Tech Progress Note Patient Details:  Tonya Hunter 1934-11-21 [id number]  Ortho Devices Type of Ortho Device: Prafo boot/shoe Ortho Device/Splint Location: RLE Ortho Device/Splint Interventions: Application   Post Interventions Patient Tolerated: Fair Instructions Provided: Care of device, Adjustment of device   [name] 09/18/2019, 12:18 PM

## 2019-09-19 ENCOUNTER — Inpatient Hospital Stay (HOSPITAL_COMMUNITY): Payer: Medicare Other

## 2019-09-19 ENCOUNTER — Other Ambulatory Visit (HOSPITAL_COMMUNITY): Payer: Medicare Other

## 2019-09-19 LAB — CBC
HCT: 24.9 % — ABNORMAL LOW (ref 36.0–46.0)
Hemoglobin: 7.9 g/dL — ABNORMAL LOW (ref 12.0–15.0)
MCH: 30 pg (ref 26.0–34.0)
MCHC: 31.7 g/dL (ref 30.0–36.0)
MCV: 94.7 fL (ref 80.0–100.0)
Platelets: 164 10*3/uL (ref 150–400)
RBC: 2.63 MIL/uL — ABNORMAL LOW (ref 3.87–5.11)
RDW: 18.6 % — ABNORMAL HIGH (ref 11.5–15.5)
WBC: 4.5 10*3/uL (ref 4.0–10.5)
nRBC: 0 % (ref 0.0–0.2)

## 2019-09-19 LAB — BASIC METABOLIC PANEL
Anion gap: 8 (ref 5–15)
BUN: 40 mg/dL — ABNORMAL HIGH (ref 8–23)
CO2: 20 mmol/L — ABNORMAL LOW (ref 22–32)
Calcium: 8.5 mg/dL — ABNORMAL LOW (ref 8.9–10.3)
Chloride: 100 mmol/L (ref 98–111)
Creatinine, Ser: 1.15 mg/dL — ABNORMAL HIGH (ref 0.44–1.00)
GFR calc Af Amer: 51 mL/min — ABNORMAL LOW (ref >60)
GFR calc non Af Amer: 44 mL/min — ABNORMAL LOW (ref >60)
Glucose, Bld: 135 mg/dL — ABNORMAL HIGH (ref 70–99)
Potassium: 4.7 mmol/L (ref 3.5–5.1)
Sodium: 128 mmol/L — ABNORMAL LOW (ref 135–145)

## 2019-09-19 LAB — GLUCOSE, CAPILLARY
Glucose-Capillary: 132 mg/dL — ABNORMAL HIGH (ref 70–99)
Glucose-Capillary: 166 mg/dL — ABNORMAL HIGH (ref 70–99)
Glucose-Capillary: 134 mg/dL — ABNORMAL HIGH (ref 70–99)
Glucose-Capillary: 110 mg/dL — ABNORMAL HIGH (ref 70–99)

## 2019-09-19 MED ORDER — FUROSEMIDE 10 MG/ML IJ SOLN
20.0000 mg | Freq: Once | INTRAMUSCULAR | Status: AC
  Administered 2019-09-19: 20 mg via INTRAVENOUS
  Filled 2019-09-19: qty 2
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
## 2019-09-19 NOTE — Progress Notes (Signed)
Inpatient Rehab Admissions Coordinator:   I have no beds available for this patient to admit to CIR today.  Will continue to follow for timing of potential admission pending bed availability.   Shann Medal, PT, DPT Admissions Coordinator [phone] 09/19/19  2:41 PM
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
## 2019-09-19 NOTE — Plan of Care (Signed)
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
## 2019-09-20 ENCOUNTER — Inpatient Hospital Stay (HOSPITAL_COMMUNITY): Payer: Medicare Other

## 2019-09-20 DIAGNOSIS — R188 Other ascites: Secondary | ICD-10-CM

## 2019-09-20 LAB — CBC
HCT: 25.3 % — ABNORMAL LOW (ref 36.0–46.0)
Hemoglobin: 8.1 g/dL — ABNORMAL LOW (ref 12.0–15.0)
MCH: 30.6 pg (ref 26.0–34.0)
MCHC: 32 g/dL (ref 30.0–36.0)
MCV: 95.5 fL (ref 80.0–100.0)
Platelets: 175 10*3/uL (ref 150–400)
RBC: 2.65 MIL/uL — ABNORMAL LOW (ref 3.87–5.11)
RDW: 19.6 % — ABNORMAL HIGH (ref 11.5–15.5)
WBC: 4.4 10*3/uL (ref 4.0–10.5)
nRBC: 0 % (ref 0.0–0.2)

## 2019-09-20 LAB — BASIC METABOLIC PANEL
Anion gap: 7 (ref 5–15)
BUN: 38 mg/dL — ABNORMAL HIGH (ref 8–23)
CO2: 19 mmol/L — ABNORMAL LOW (ref 22–32)
Calcium: 8.2 mg/dL — ABNORMAL LOW (ref 8.9–10.3)
Chloride: 102 mmol/L (ref 98–111)
Creatinine, Ser: 1.15 mg/dL — ABNORMAL HIGH (ref 0.44–1.00)
GFR calc Af Amer: 51 mL/min — ABNORMAL LOW (ref >60)
GFR calc non Af Amer: 44 mL/min — ABNORMAL LOW (ref >60)
Glucose, Bld: 117 mg/dL — ABNORMAL HIGH (ref 70–99)
Potassium: 4.6 mmol/L (ref 3.5–5.1)
Sodium: 128 mmol/L — ABNORMAL LOW (ref 135–145)

## 2019-09-20 LAB — GLUCOSE, CAPILLARY
Glucose-Capillary: 124 mg/dL — ABNORMAL HIGH (ref 70–99)
Glucose-Capillary: 154 mg/dL — ABNORMAL HIGH (ref 70–99)
Glucose-Capillary: 153 mg/dL — ABNORMAL HIGH (ref 70–99)
Glucose-Capillary: 163 mg/dL — ABNORMAL HIGH (ref 70–99)
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
## 2019-09-20 NOTE — Plan of Care (Addendum)
Pt educated on elevating her legs and floating her heels. Pt refused. Heel pads placed d/t reddened/blanchable heels. Attempt to turn pt but pt refused and said she didn't want to me moved because she is finally able to sleep. Will continue to educate and attempt to implement these practices.    Problem: Education: Goal: Knowledge of General Education information will improve Description: Including pain rating scale, medication(s)/side effects and non-pharmacologic comfort measures Outcome: Progressing   Problem: Activity: Goal: Risk for activity intolerance will decrease Outcome: Progressing   Problem: Pain Managment: Goal: General experience of comfort will improve Outcome: Progressing   Problem: Safety: Goal: Ability to remain free from injury will improve Outcome: Progressing   Problem: Skin Integrity: Goal: Risk for impaired skin integrity will decrease Outcome: Progressing

## 2019-09-20 NOTE — Progress Notes (Signed)
ANTICOAGULATION CONSULT NOTE - Follow Up Consult  Pharmacy Consult for Eliquis Indication: DVT  Allergies  Allergen Reactions  . Demerol [Meperidine] Nausea And Vomiting  . Penicillins Swelling    approx 85 years old    Patient Measurements: Height: 5' 7.01" (170.2 cm) Weight: 84.9 kg (187 lb 2.7 oz) IBW/kg (Calculated) : 61.62  Vital Signs: Temp: 97.5 F (36.4 C) (05/07 0741) Temp Source: Oral (05/07 0741) BP: 117/55 (05/07 0741) Pulse Rate: 77 (05/07 0741)  Labs: Recent Labs    09/18/19 0617 09/18/19 0617 09/19/19 0411 09/20/19 0348  HGB 8.2*   < > 7.9* 8.1*  HCT 25.7*  --  24.9* 25.3*  PLT 208  --  164 175  CREATININE 1.30*  --  1.15* 1.15*   < > = values in this interval not displayed.    Estimated Creatinine Clearance: 42.2 mL/min (A) (by C-G formula based on SCr of 1.15 mg/dL (H)).  Assessment: Anticoag: no DVT proph > bilateral DVTs>enox>hep gtt> now apixaban starting 5/2. Retrievable IVC filter placement on 09/11/19. Interventional radiology to assess for possible retrieval of IVC filter in the next 8 weeks. Abdominal wall hematomas.  - Hgb 8.1 low but relatively stable  Goal of Therapy:  Therapeutic oral anticoagulation   Plan:  - Start apixaban 66m PO bid for 7 days then (on 09/22/2019) decrease to 573mPO bid - Education completed with husband on 5/6 -Pharmacy will sign off. Please reconsult for further dosing assitance.    Hena Ewalt S. RoAlford HighlandPharmD, BCPS Clinical Staff Pharmacist [email or web address] RoAlford HighlandCrThe [employer]/11/2019,8:39 AM
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
## 2019-09-20 NOTE — Progress Notes (Signed)
Inpatient Rehab Admissions Coordinator:   I have no beds available for this patient to admit to CIR today or through the weekend.  Will continue to follow for timing of potential admission pending bed availability.   Shann Medal, PT, DPT Admissions Coordinator [phone] 09/20/19  11:52 AM
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
## 2019-09-20 NOTE — Plan of Care (Signed)

## 2019-09-21 LAB — CBC
HCT: 27.7 % — ABNORMAL LOW (ref 36.0–46.0)
Hemoglobin: 8.7 g/dL — ABNORMAL LOW (ref 12.0–15.0)
MCH: 29.9 pg (ref 26.0–34.0)
MCHC: 31.4 g/dL (ref 30.0–36.0)
MCV: 95.2 fL (ref 80.0–100.0)
Platelets: 190 10*3/uL (ref 150–400)
RBC: 2.91 MIL/uL — ABNORMAL LOW (ref 3.87–5.11)
RDW: 19.8 % — ABNORMAL HIGH (ref 11.5–15.5)
WBC: 6.1 10*3/uL (ref 4.0–10.5)
nRBC: 0 % (ref 0.0–0.2)

## 2019-09-21 LAB — GLUCOSE, CAPILLARY
Glucose-Capillary: 141 mg/dL — ABNORMAL HIGH (ref 70–99)
Glucose-Capillary: 139 mg/dL — ABNORMAL HIGH (ref 70–99)
Glucose-Capillary: 222 mg/dL — ABNORMAL HIGH (ref 70–99)
Glucose-Capillary: 144 mg/dL — ABNORMAL HIGH (ref 70–99)

## 2019-09-21 NOTE — Plan of Care (Signed)
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
## 2019-09-22 LAB — CBC
HCT: 30.2 % — ABNORMAL LOW (ref 36.0–46.0)
Hemoglobin: 9.2 g/dL — ABNORMAL LOW (ref 12.0–15.0)
MCH: 30.2 pg (ref 26.0–34.0)
MCHC: 30.5 g/dL (ref 30.0–36.0)
MCV: 99 fL (ref 80.0–100.0)
Platelets: 168 10*3/uL (ref 150–400)
RBC: 3.05 MIL/uL — ABNORMAL LOW (ref 3.87–5.11)
RDW: 20.1 % — ABNORMAL HIGH (ref 11.5–15.5)
WBC: 5.4 10*3/uL (ref 4.0–10.5)
nRBC: 0 % (ref 0.0–0.2)

## 2019-09-22 LAB — GLUCOSE, CAPILLARY
Glucose-Capillary: 111 mg/dL — ABNORMAL HIGH (ref 70–99)
Glucose-Capillary: 119 mg/dL — ABNORMAL HIGH (ref 70–99)
Glucose-Capillary: 165 mg/dL — ABNORMAL HIGH (ref 70–99)
Glucose-Capillary: 194 mg/dL — ABNORMAL HIGH (ref 70–99)

## 2019-09-22 NOTE — Plan of Care (Signed)
°  Problem: Nutrition: °Goal: Adequate nutrition will be maintained °Outcome: Progressing °  °Problem: Elimination: °Goal: Will not experience complications related to bowel motility °Outcome: Progressing °Goal: Will not experience complications related to urinary retention °Outcome: Progressing °  °Problem: Pain Managment: °Goal: General experience of comfort will improve °Outcome: Progressing °  °Problem: Safety: °Goal: Ability to remain free from injury will improve °Outcome: Progressing °  °

## 2019-09-22 NOTE — Progress Notes (Signed)
Removed foley cath on the previous shift per order, approx, 362m fluid was adminstered orally. Bladder scanned at 99cc (Earley Favor and 151cc(6a). Pt. Still due to void. Denies pain. Bladder is soft, NT.
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
## 2019-09-22 NOTE — Progress Notes (Signed)
At the beginning of the shift, writer encouraged pt. To get back in bed for repositioning and turning however pt. has refused , Probation officer has been asking patient if we can transfer to bed however has refused multiple times, patient eventually agreed to writer around 2a to be transferred in bed, Upon patient care and changing her sacral dressing as needed, noted blistered area and redness on the peri wound. Writer denied pain in the area. Wound care consult was placed. Turned and repositioned q 2hours as tolerated and is cooperative thus far.

## 2019-09-22 NOTE — Progress Notes (Signed)
Pt bladder scanned and was 352m. Order for In&Oet cath received. In&Out done with 5046moutput of amber urine. Pt encouraged to drink fluids. Right toe dressing changed per order.

## 2019-09-23 ENCOUNTER — Inpatient Hospital Stay (HOSPITAL_COMMUNITY): Payer: Medicare Other

## 2019-09-23 HISTORY — PX: IR PARACENTESIS: IMG2679

## 2019-09-23 LAB — BASIC METABOLIC PANEL
Anion gap: 9 (ref 5–15)
BUN: 30 mg/dL — ABNORMAL HIGH (ref 8–23)
CO2: 19 mmol/L — ABNORMAL LOW (ref 22–32)
Calcium: 8.6 mg/dL — ABNORMAL LOW (ref 8.9–10.3)
Chloride: 103 mmol/L (ref 98–111)
Creatinine, Ser: 1.07 mg/dL — ABNORMAL HIGH (ref 0.44–1.00)
GFR calc Af Amer: 56 mL/min — ABNORMAL LOW (ref >60)
GFR calc non Af Amer: 48 mL/min — ABNORMAL LOW (ref >60)
Glucose, Bld: 161 mg/dL — ABNORMAL HIGH (ref 70–99)
Potassium: 4.1 mmol/L (ref 3.5–5.1)
Sodium: 131 mmol/L — ABNORMAL LOW (ref 135–145)

## 2019-09-23 LAB — LACTATE DEHYDROGENASE: LDH: 244 U/L — ABNORMAL HIGH (ref 98–192)

## 2019-09-23 LAB — CBC
HCT: 29.2 % — ABNORMAL LOW (ref 36.0–46.0)
Hemoglobin: 9.3 g/dL — ABNORMAL LOW (ref 12.0–15.0)
MCH: 30.9 pg (ref 26.0–34.0)
MCHC: 31.8 g/dL (ref 30.0–36.0)
MCV: 97 fL (ref 80.0–100.0)
Platelets: 192 10*3/uL (ref 150–400)
RBC: 3.01 MIL/uL — ABNORMAL LOW (ref 3.87–5.11)
RDW: 20.5 % — ABNORMAL HIGH (ref 11.5–15.5)
WBC: 5.8 10*3/uL (ref 4.0–10.5)
nRBC: 0 % (ref 0.0–0.2)

## 2019-09-23 LAB — BODY FLUID CELL COUNT WITH DIFFERENTIAL
Eos, Fluid: 0 %
Lymphs, Fluid: 62 %
Monocyte-Macrophage-Serous Fluid: 21 % — ABNORMAL LOW (ref 50–90)
Neutrophil Count, Fluid: 17 % (ref 0–25)
Total Nucleated Cell Count, Fluid: 42 cu mm (ref 0–1000)

## 2019-09-23 LAB — HEPATIC FUNCTION PANEL
ALT: 33 U/L (ref 0–44)
AST: 57 U/L — ABNORMAL HIGH (ref 15–41)
Albumin: 2.3 g/dL — ABNORMAL LOW (ref 3.5–5.0)
Alkaline Phosphatase: 234 U/L — ABNORMAL HIGH (ref 38–126)
Bilirubin, Direct: 0.8 mg/dL — ABNORMAL HIGH (ref 0.0–0.2)
Indirect Bilirubin: 1.9 mg/dL — ABNORMAL HIGH (ref 0.3–0.9)
Total Bilirubin: 2.7 mg/dL — ABNORMAL HIGH (ref 0.3–1.2)
Total Protein: 5.3 g/dL — ABNORMAL LOW (ref 6.5–8.1)

## 2019-09-23 LAB — GLUCOSE, CAPILLARY
Glucose-Capillary: 160 mg/dL — ABNORMAL HIGH (ref 70–99)
Glucose-Capillary: 173 mg/dL — ABNORMAL HIGH (ref 70–99)
Glucose-Capillary: 148 mg/dL — ABNORMAL HIGH (ref 70–99)
Glucose-Capillary: 181 mg/dL — ABNORMAL HIGH (ref 70–99)

## 2019-09-23 LAB — PROTEIN, PLEURAL OR PERITONEAL FLUID: Total protein, fluid: <3 g/dL

## 2019-09-23 LAB — GRAM STAIN

## 2019-09-23 LAB — LACTATE DEHYDROGENASE, PLEURAL OR PERITONEAL FLUID: LD, Fluid: 33 U/L — ABNORMAL HIGH (ref 3–23)

## 2019-09-23 MED ORDER — LIDOCAINE HCL 1 % IJ SOLN
INTRAMUSCULAR | Status: DC | PRN
  Administered 2019-09-23: 10 mL

## 2019-09-23 MED ORDER — LIDOCAINE HCL 1 % IJ SOLN
INTRAMUSCULAR | Status: AC
  Filled 2019-09-23: qty 20

## 2019-09-23 MED ORDER — IOHEXOL 300 MG/ML  SOLN
75.0000 mL | Freq: Once | INTRAMUSCULAR | Status: AC | PRN
  Administered 2019-09-23: 75 mL via INTRAVENOUS

## 2019-09-23 NOTE — Plan of Care (Signed)
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
## 2019-09-23 NOTE — Progress Notes (Signed)
Inpatient Rehab Admissions Coordinator:   I have no beds available for this patient to admit to CIR today.  Will continue to follow for timing of possible admission pending bed availability.   Shann Medal, PT, DPT Admissions Coordinator [phone] 09/23/19  11:27 AM

## 2019-09-23 NOTE — Progress Notes (Signed)
Patient unable to void with >800 ml bladder scanned. In and out done with only 200 ml of clear amber urine out. Bladder scanned the second time and still patient has 656 ml left. Notified TR[facility] Randol Kern, NP and obtained to insert foley. Foley was inserted but no backflow of urine. Balloon was inflated and a very little amount of urine came. Patient denies any discomfort. Notified Randol Kern, NP and ordered CT-scan of abdomen to see any obstruction or trauma to the bladder. Patient resting on the bed as of now. Vital signs WNL. Nursing will continue to monitor.
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
## 2019-09-23 NOTE — Procedures (Signed)
Ultrasound-guided diagnostic and therapeutic paracentesis performed yielding 3.9 liters of amber colored fluid.  Fluid was sent to lab for analysis. No immediate complications. EBL is none.

## 2019-09-23 NOTE — Progress Notes (Signed)
PT Cancellation Note  Patient Details Name: Tonya Hunter MRN: [id number] DOB: Sep 08, 1934   Cancelled Treatment:    Reason Eval/Treat Not Completed: (P) Patient at procedure or test/unavailable(Pt off unit for paracentesis.  Will f/u per POC.)   [name] 09/23/2019, 3:30 PM Erasmo Leventhal , PTA Acute Rehabilitation Services Pager [phone] Office [phone]

## 2019-09-24 ENCOUNTER — Inpatient Hospital Stay (HOSPITAL_COMMUNITY)
Admission: RE | Admit: 2019-09-24 | Discharge: 2019-10-29 | DRG: 559 | Disposition: A | Discharge status: Skilled Nursing Facility | Payer: Medicare Other | Source: Intra-hospital | Attending: Physical Medicine and Rehabilitation | Admitting: Physical Medicine and Rehabilitation

## 2019-09-24 ENCOUNTER — Other Ambulatory Visit: Payer: Self-pay

## 2019-09-24 ENCOUNTER — Encounter: Payer: Self-pay | Admitting: Cardiovascular Disease

## 2019-09-24 ENCOUNTER — Encounter (HOSPITAL_COMMUNITY): Payer: Self-pay | Admitting: Pulmonary Disease

## 2019-09-24 DIAGNOSIS — Z515 Encounter for palliative care: Secondary | ICD-10-CM | POA: Diagnosis not present

## 2019-09-24 DIAGNOSIS — E118 Type 2 diabetes mellitus with unspecified complications: Secondary | ICD-10-CM | POA: Diagnosis not present

## 2019-09-24 DIAGNOSIS — Z8673 Personal history of transient ischemic attack (TIA), and cerebral infarction without residual deficits: Secondary | ICD-10-CM

## 2019-09-24 DIAGNOSIS — D61818 Other pancytopenia: Secondary | ICD-10-CM | POA: Diagnosis present

## 2019-09-24 DIAGNOSIS — L97519 Non-pressure chronic ulcer of other part of right foot with unspecified severity: Secondary | ICD-10-CM | POA: Diagnosis present

## 2019-09-24 DIAGNOSIS — K7290 Hepatic failure, unspecified without coma: Secondary | ICD-10-CM | POA: Diagnosis present

## 2019-09-24 DIAGNOSIS — L89153 Pressure ulcer of sacral region, stage 3: Secondary | ICD-10-CM | POA: Diagnosis present

## 2019-09-24 DIAGNOSIS — R32 Unspecified urinary incontinence: Secondary | ICD-10-CM | POA: Diagnosis present

## 2019-09-24 DIAGNOSIS — Z86718 Personal history of other venous thrombosis and embolism: Secondary | ICD-10-CM

## 2019-09-24 DIAGNOSIS — S2220XD Unspecified fracture of sternum, subsequent encounter for fracture with routine healing: Secondary | ICD-10-CM | POA: Diagnosis not present

## 2019-09-24 DIAGNOSIS — N179 Acute kidney failure, unspecified: Secondary | ICD-10-CM | POA: Diagnosis not present

## 2019-09-24 DIAGNOSIS — S2243XD Multiple fractures of ribs, bilateral, subsequent encounter for fracture with routine healing: Secondary | ICD-10-CM | POA: Diagnosis not present

## 2019-09-24 DIAGNOSIS — E113593 Type 2 diabetes mellitus with proliferative diabetic retinopathy without macular edema, bilateral: Secondary | ICD-10-CM | POA: Diagnosis present

## 2019-09-24 DIAGNOSIS — S72491S Other fracture of lower end of right femur, sequela: Secondary | ICD-10-CM | POA: Diagnosis not present

## 2019-09-24 DIAGNOSIS — D72819 Decreased white blood cell count, unspecified: Secondary | ICD-10-CM | POA: Diagnosis not present

## 2019-09-24 DIAGNOSIS — I824Z3 Acute embolism and thrombosis of unspecified deep veins of distal lower extremity, bilateral: Secondary | ICD-10-CM | POA: Diagnosis not present

## 2019-09-24 DIAGNOSIS — I2510 Atherosclerotic heart disease of native coronary artery without angina pectoris: Secondary | ICD-10-CM | POA: Diagnosis present

## 2019-09-24 DIAGNOSIS — K7581 Nonalcoholic steatohepatitis (NASH): Secondary | ICD-10-CM | POA: Diagnosis not present

## 2019-09-24 DIAGNOSIS — Z90710 Acquired absence of both cervix and uterus: Secondary | ICD-10-CM

## 2019-09-24 DIAGNOSIS — S72401S Unspecified fracture of lower end of right femur, sequela: Secondary | ICD-10-CM | POA: Diagnosis not present

## 2019-09-24 DIAGNOSIS — K5900 Constipation, unspecified: Secondary | ICD-10-CM | POA: Diagnosis present

## 2019-09-24 DIAGNOSIS — Z96653 Presence of artificial knee joint, bilateral: Secondary | ICD-10-CM | POA: Diagnosis present

## 2019-09-24 DIAGNOSIS — Z7189 Other specified counseling: Secondary | ICD-10-CM

## 2019-09-24 DIAGNOSIS — T1490XA Injury, unspecified, initial encounter: Secondary | ICD-10-CM | POA: Diagnosis not present

## 2019-09-24 DIAGNOSIS — Z955 Presence of coronary angioplasty implant and graft: Secondary | ICD-10-CM

## 2019-09-24 DIAGNOSIS — Z66 Do not resuscitate: Secondary | ICD-10-CM | POA: Diagnosis present

## 2019-09-24 DIAGNOSIS — S72491A Other fracture of lower end of right femur, initial encounter for closed fracture: Secondary | ICD-10-CM | POA: Diagnosis not present

## 2019-09-24 DIAGNOSIS — K7460 Unspecified cirrhosis of liver: Secondary | ICD-10-CM | POA: Diagnosis not present

## 2019-09-24 DIAGNOSIS — E871 Hypo-osmolality and hyponatremia: Secondary | ICD-10-CM | POA: Diagnosis present

## 2019-09-24 DIAGNOSIS — H353130 Nonexudative age-related macular degeneration, bilateral, stage unspecified: Secondary | ICD-10-CM | POA: Diagnosis present

## 2019-09-24 DIAGNOSIS — I110 Hypertensive heart disease with heart failure: Secondary | ICD-10-CM | POA: Diagnosis present

## 2019-09-24 DIAGNOSIS — R52 Pain, unspecified: Secondary | ICD-10-CM | POA: Diagnosis not present

## 2019-09-24 DIAGNOSIS — E785 Hyperlipidemia, unspecified: Secondary | ICD-10-CM | POA: Diagnosis present

## 2019-09-24 DIAGNOSIS — R159 Full incontinence of feces: Secondary | ICD-10-CM | POA: Diagnosis present

## 2019-09-24 DIAGNOSIS — Z79890 Hormone replacement therapy (postmenopausal): Secondary | ICD-10-CM

## 2019-09-24 DIAGNOSIS — E11621 Type 2 diabetes mellitus with foot ulcer: Secondary | ICD-10-CM | POA: Diagnosis present

## 2019-09-24 DIAGNOSIS — Z20822 Contact with and (suspected) exposure to covid-19: Secondary | ICD-10-CM | POA: Diagnosis present

## 2019-09-24 DIAGNOSIS — R140 Abdominal distension (gaseous): Secondary | ICD-10-CM | POA: Diagnosis not present

## 2019-09-24 DIAGNOSIS — I8500 Esophageal varices without bleeding: Secondary | ICD-10-CM | POA: Diagnosis not present

## 2019-09-24 DIAGNOSIS — M9711XD Periprosthetic fracture around internal prosthetic right knee joint, subsequent encounter: Secondary | ICD-10-CM

## 2019-09-24 DIAGNOSIS — Z7902 Long term (current) use of antithrombotics/antiplatelets: Secondary | ICD-10-CM | POA: Diagnosis not present

## 2019-09-24 DIAGNOSIS — Z7984 Long term (current) use of oral hypoglycemic drugs: Secondary | ICD-10-CM | POA: Diagnosis not present

## 2019-09-24 DIAGNOSIS — I509 Heart failure, unspecified: Secondary | ICD-10-CM | POA: Diagnosis present

## 2019-09-24 DIAGNOSIS — S82101D Unspecified fracture of upper end of right tibia, subsequent encounter for closed fracture with routine healing: Secondary | ICD-10-CM | POA: Diagnosis not present

## 2019-09-24 DIAGNOSIS — R110 Nausea: Secondary | ICD-10-CM

## 2019-09-24 DIAGNOSIS — I82409 Acute embolism and thrombosis of unspecified deep veins of unspecified lower extremity: Secondary | ICD-10-CM | POA: Diagnosis present

## 2019-09-24 DIAGNOSIS — S82141D Displaced bicondylar fracture of right tibia, subsequent encounter for closed fracture with routine healing: Secondary | ICD-10-CM

## 2019-09-24 DIAGNOSIS — I10 Essential (primary) hypertension: Secondary | ICD-10-CM | POA: Diagnosis present

## 2019-09-24 DIAGNOSIS — S301XXA Contusion of abdominal wall, initial encounter: Secondary | ICD-10-CM

## 2019-09-24 DIAGNOSIS — H353131 Nonexudative age-related macular degeneration, bilateral, early dry stage: Secondary | ICD-10-CM | POA: Diagnosis present

## 2019-09-24 DIAGNOSIS — F419 Anxiety disorder, unspecified: Secondary | ICD-10-CM | POA: Diagnosis present

## 2019-09-24 DIAGNOSIS — S82101A Unspecified fracture of upper end of right tibia, initial encounter for closed fracture: Secondary | ICD-10-CM | POA: Diagnosis present

## 2019-09-24 DIAGNOSIS — R188 Other ascites: Secondary | ICD-10-CM | POA: Diagnosis present

## 2019-09-24 DIAGNOSIS — D62 Acute posthemorrhagic anemia: Secondary | ICD-10-CM | POA: Diagnosis present

## 2019-09-24 DIAGNOSIS — Z95828 Presence of other vascular implants and grafts: Secondary | ICD-10-CM

## 2019-09-24 DIAGNOSIS — L97509 Non-pressure chronic ulcer of other part of unspecified foot with unspecified severity: Secondary | ICD-10-CM | POA: Diagnosis present

## 2019-09-24 DIAGNOSIS — S72401A Unspecified fracture of lower end of right femur, initial encounter for closed fracture: Secondary | ICD-10-CM | POA: Diagnosis present

## 2019-09-24 DIAGNOSIS — Z7982 Long term (current) use of aspirin: Secondary | ICD-10-CM | POA: Diagnosis not present

## 2019-09-24 DIAGNOSIS — I252 Old myocardial infarction: Secondary | ICD-10-CM

## 2019-09-24 DIAGNOSIS — T148XXA Other injury of unspecified body region, initial encounter: Secondary | ICD-10-CM

## 2019-09-24 DIAGNOSIS — E039 Hypothyroidism, unspecified: Secondary | ICD-10-CM | POA: Diagnosis present

## 2019-09-24 DIAGNOSIS — Z09 Encounter for follow-up examination after completed treatment for conditions other than malignant neoplasm: Secondary | ICD-10-CM

## 2019-09-24 DIAGNOSIS — S72401D Unspecified fracture of lower end of right femur, subsequent encounter for closed fracture with routine healing: Secondary | ICD-10-CM | POA: Diagnosis not present

## 2019-09-24 DIAGNOSIS — E1165 Type 2 diabetes mellitus with hyperglycemia: Secondary | ICD-10-CM | POA: Diagnosis not present

## 2019-09-24 DIAGNOSIS — Z79899 Other long term (current) drug therapy: Secondary | ICD-10-CM | POA: Diagnosis not present

## 2019-09-24 DIAGNOSIS — R0789 Other chest pain: Secondary | ICD-10-CM | POA: Diagnosis present

## 2019-09-24 DIAGNOSIS — K7682 Hepatic encephalopathy: Secondary | ICD-10-CM

## 2019-09-24 DIAGNOSIS — K7469 Other cirrhosis of liver: Secondary | ICD-10-CM | POA: Diagnosis present

## 2019-09-24 DIAGNOSIS — Z85828 Personal history of other malignant neoplasm of skin: Secondary | ICD-10-CM

## 2019-09-24 DIAGNOSIS — Z7901 Long term (current) use of anticoagulants: Secondary | ICD-10-CM

## 2019-09-24 HISTORY — DX: Other diseases of stomach and duodenum: K31.89

## 2019-09-24 HISTORY — DX: Diverticulosis of small intestine without perforation or abscess without bleeding: K57.10

## 2019-09-24 HISTORY — DX: Colles' fracture of left radius, initial encounter for closed fracture: S52.532A

## 2019-09-24 LAB — GLUCOSE, CAPILLARY
Glucose-Capillary: 130 mg/dL — ABNORMAL HIGH (ref 70–99)
Glucose-Capillary: 166 mg/dL — ABNORMAL HIGH (ref 70–99)
Glucose-Capillary: 101 mg/dL — ABNORMAL HIGH (ref 70–99)
Glucose-Capillary: 145 mg/dL — ABNORMAL HIGH (ref 70–99)

## 2019-09-24 MED ORDER — ENSURE MAX PROTEIN PO LIQD
11.0000 [oz_av] | Freq: Every day | ORAL | Status: DC
  Administered 2019-09-25 – 2019-10-18 (×19): 11 [oz_av] via ORAL
  Filled 2019-09-24 (×25): qty 330

## 2019-09-24 MED ORDER — POLYETHYLENE GLYCOL 3350 17 G PO PACK: 17.0000 g | PACK | Freq: Two times a day (BID) | ORAL | 0 refills | Status: DC

## 2019-09-24 MED ORDER — PANTOPRAZOLE SODIUM 40 MG PO TBEC
40.0000 mg | DELAYED_RELEASE_TABLET | Freq: Two times a day (BID) | ORAL | Status: DC
  Administered 2019-09-24 – 2019-10-29 (×71): 40 mg via ORAL
  Filled 2019-09-24 (×71): qty 1

## 2019-09-24 MED ORDER — INSULIN GLARGINE 100 UNIT/ML ~~LOC~~ SOLN
10.0000 [IU] | Freq: Every day | SUBCUTANEOUS | Status: DC
  Administered 2019-09-25 – 2019-09-26 (×2): 10 [IU] via SUBCUTANEOUS
  Filled 2019-09-24 (×2): qty 0.1

## 2019-09-24 MED ORDER — FLEET ENEMA 7-19 GM/118ML RE ENEM: 1.0000 | ENEMA | Freq: Once | RECTAL | Status: DC | PRN

## 2019-09-24 MED ORDER — ACETAMINOPHEN 325 MG PO TABS
650.0000 mg | ORAL_TABLET | Freq: Four times a day (QID) | ORAL | Status: DC
  Administered 2019-09-24 – 2019-10-11 (×65): 650 mg via ORAL
  Filled 2019-09-24 (×65): qty 2

## 2019-09-24 MED ORDER — SENNOSIDES-DOCUSATE SODIUM 8.6-50 MG PO TABS: 2.0000 | ORAL_TABLET | Freq: Two times a day (BID) | ORAL | Status: DC

## 2019-09-24 MED ORDER — POLYETHYLENE GLYCOL 3350 17 G PO PACK
17.0000 g | PACK | Freq: Two times a day (BID) | ORAL | Status: DC
  Administered 2019-09-24: 17 g via ORAL
  Filled 2019-09-24: qty 1

## 2019-09-24 MED ORDER — ORAL CARE MOUTH RINSE
15.0000 mL | Freq: Two times a day (BID) | OROMUCOSAL | Status: DC
  Administered 2019-09-25 – 2019-10-29 (×50): 15 mL via OROMUCOSAL

## 2019-09-24 MED ORDER — BISACODYL 5 MG PO TBEC: 5.0000 mg | DELAYED_RELEASE_TABLET | Freq: Every day | ORAL | 0 refills | Status: DC | PRN

## 2019-09-24 MED ORDER — METHOCARBAMOL 500 MG PO TABS: 500.0000 mg | ORAL_TABLET | Freq: Three times a day (TID) | ORAL | 0 refills | Status: DC

## 2019-09-24 MED ORDER — INSULIN ASPART 100 UNIT/ML ~~LOC~~ SOLN
0.0000 [IU] | Freq: Three times a day (TID) | SUBCUTANEOUS | Status: DC
  Administered 2019-09-25: 09:00:00 3 [IU] via SUBCUTANEOUS
  Administered 2019-09-25: 18:00:00 2 [IU] via SUBCUTANEOUS
  Administered 2019-09-25: 12:00:00 3 [IU] via SUBCUTANEOUS
  Administered 2019-09-26 (×2): 2 [IU] via SUBCUTANEOUS
  Administered 2019-09-26: 3 [IU] via SUBCUTANEOUS
  Administered 2019-09-27: 1 [IU] via SUBCUTANEOUS
  Administered 2019-09-27 – 2019-09-28 (×3): 2 [IU] via SUBCUTANEOUS
  Administered 2019-09-28: 3 [IU] via SUBCUTANEOUS
  Administered 2019-09-29: 2 [IU] via SUBCUTANEOUS
  Administered 2019-09-29: 1 [IU] via SUBCUTANEOUS
  Administered 2019-09-29: 2 [IU] via SUBCUTANEOUS
  Administered 2019-09-30: 1 [IU] via SUBCUTANEOUS
  Administered 2019-09-30: 2 [IU] via SUBCUTANEOUS
  Administered 2019-09-30: 9 [IU] via SUBCUTANEOUS
  Administered 2019-10-01 (×3): 2 [IU] via SUBCUTANEOUS
  Administered 2019-10-02: 1 [IU] via SUBCUTANEOUS
  Administered 2019-10-02: 5 [IU] via SUBCUTANEOUS
  Administered 2019-10-02 – 2019-10-03 (×2): 2 [IU] via SUBCUTANEOUS
  Administered 2019-10-03: 1 [IU] via SUBCUTANEOUS
  Administered 2019-10-03: 3 [IU] via SUBCUTANEOUS
  Administered 2019-10-04 – 2019-10-05 (×3): 1 [IU] via SUBCUTANEOUS
  Administered 2019-10-05: 3 [IU] via SUBCUTANEOUS
  Administered 2019-10-05: 1 [IU] via SUBCUTANEOUS
  Administered 2019-10-06 – 2019-10-07 (×3): 2 [IU] via SUBCUTANEOUS
  Administered 2019-10-08 (×3): 1 [IU] via SUBCUTANEOUS
  Administered 2019-10-09: 2 [IU] via SUBCUTANEOUS
  Administered 2019-10-09 (×2): 1 [IU] via SUBCUTANEOUS
  Administered 2019-10-10: 2 [IU] via SUBCUTANEOUS
  Administered 2019-10-11 – 2019-10-12 (×3): 1 [IU] via SUBCUTANEOUS
  Administered 2019-10-13: 2 [IU] via SUBCUTANEOUS
  Administered 2019-10-13 – 2019-10-14 (×2): 1 [IU] via SUBCUTANEOUS
  Administered 2019-10-14: 3 [IU] via SUBCUTANEOUS
  Administered 2019-10-15 – 2019-10-19 (×7): 1 [IU] via SUBCUTANEOUS
  Administered 2019-10-20: 3 [IU] via SUBCUTANEOUS
  Administered 2019-10-21: 2 [IU] via SUBCUTANEOUS
  Administered 2019-10-22: 1 [IU] via SUBCUTANEOUS
  Administered 2019-10-22 – 2019-10-24 (×3): 2 [IU] via SUBCUTANEOUS
  Administered 2019-10-25: 1 [IU] via SUBCUTANEOUS
  Administered 2019-10-26: 2 [IU] via SUBCUTANEOUS
  Administered 2019-10-26 – 2019-10-27 (×2): 1 [IU] via SUBCUTANEOUS
  Administered 2019-10-27: 2 [IU] via SUBCUTANEOUS
  Administered 2019-10-27: 1 [IU] via SUBCUTANEOUS
  Administered 2019-10-28: 2 [IU] via SUBCUTANEOUS
  Administered 2019-10-28: 1 [IU] via SUBCUTANEOUS
  Administered 2019-10-29: 2 [IU] via SUBCUTANEOUS
  Administered 2019-10-29: 1 [IU] via SUBCUTANEOUS

## 2019-09-24 MED ORDER — APIXABAN 5 MG PO TABS
5.0000 mg | ORAL_TABLET | Freq: Two times a day (BID) | ORAL | Status: DC
  Administered 2019-09-24 – 2019-10-29 (×71): 5 mg via ORAL
  Filled 2019-09-24 (×71): qty 1

## 2019-09-24 MED ORDER — DIPHENHYDRAMINE HCL 12.5 MG/5ML PO ELIX: 12.5000 mg | ORAL_SOLUTION | Freq: Four times a day (QID) | ORAL | Status: DC | PRN

## 2019-09-24 MED ORDER — METHOCARBAMOL 500 MG PO TABS
500.0000 mg | ORAL_TABLET | Freq: Three times a day (TID) | ORAL | Status: DC
  Administered 2019-09-24 – 2019-10-29 (×99): 500 mg via ORAL
  Filled 2019-09-24 (×106): qty 1

## 2019-09-24 MED ORDER — PROCHLORPERAZINE 25 MG RE SUPP
12.5000 mg | Freq: Four times a day (QID) | RECTAL | Status: DC | PRN
  Filled 2019-09-24: qty 1

## 2019-09-24 MED ORDER — GUAIFENESIN-DM 100-10 MG/5ML PO SYRP: 5.0000 mL | ORAL_SOLUTION | Freq: Four times a day (QID) | ORAL | Status: DC | PRN

## 2019-09-24 MED ORDER — PROCHLORPERAZINE MALEATE 5 MG PO TABS
5.0000 mg | ORAL_TABLET | Freq: Four times a day (QID) | ORAL | Status: DC | PRN
  Administered 2019-09-25 – 2019-10-08 (×2): 10 mg via ORAL
  Filled 2019-09-24: qty 1
  Filled 2019-09-24 (×2): qty 2

## 2019-09-24 MED ORDER — SODIUM CHLORIDE 1 G PO TABS: 1.0000 g | ORAL_TABLET | Freq: Three times a day (TID) | ORAL | Status: DC

## 2019-09-24 MED ORDER — ALUM & MAG HYDROXIDE-SIMETH 200-200-20 MG/5ML PO SUSP: 30.0000 mL | ORAL | Status: DC | PRN

## 2019-09-24 MED ORDER — INSULIN GLARGINE 100 UNIT/ML ~~LOC~~ SOLN: 10.0000 [IU] | Freq: Every day | SUBCUTANEOUS | 11 refills | Status: DC

## 2019-09-24 MED ORDER — LEVOTHYROXINE SODIUM 137 MCG PO TABS
137.0000 ug | ORAL_TABLET | Freq: Every day | ORAL | Status: DC
  Administered 2019-09-25 – 2019-10-29 (×35): 137 ug via ORAL
  Filled 2019-09-24 (×42): qty 1

## 2019-09-24 MED ORDER — FERROUS SULFATE 325 (65 FE) MG PO TABS
325.0000 mg | ORAL_TABLET | Freq: Every day | ORAL | Status: DC
  Administered 2019-09-25 – 2019-10-29 (×35): 325 mg via ORAL
  Filled 2019-09-24 (×35): qty 1

## 2019-09-24 MED ORDER — BISACODYL 10 MG RE SUPP
10.0000 mg | Freq: Every day | RECTAL | Status: DC | PRN
  Administered 2019-10-01: 10 mg via RECTAL
  Filled 2019-09-24: qty 1

## 2019-09-24 MED ORDER — PRO-STAT SUGAR FREE PO LIQD
30.0000 mL | Freq: Two times a day (BID) | ORAL | Status: DC
  Administered 2019-09-24 – 2019-10-18 (×47): 30 mL via ORAL
  Filled 2019-09-24 (×48): qty 30

## 2019-09-24 MED ORDER — INSULIN ASPART 100 UNIT/ML ~~LOC~~ SOLN
0.0000 [IU] | Freq: Every day | SUBCUTANEOUS | Status: DC
  Administered 2019-09-25: 2 [IU] via SUBCUTANEOUS
  Administered 2019-09-26: 3 [IU] via SUBCUTANEOUS
  Administered 2019-09-27: 2 [IU] via SUBCUTANEOUS
  Administered 2019-09-29 – 2019-10-02 (×2): 3 [IU] via SUBCUTANEOUS
  Administered 2019-10-09: 2 [IU] via SUBCUTANEOUS

## 2019-09-24 MED ORDER — PROCHLORPERAZINE EDISYLATE 10 MG/2ML IJ SOLN
5.0000 mg | Freq: Four times a day (QID) | INTRAMUSCULAR | Status: DC | PRN
  Administered 2019-09-30: 10 mg via INTRAMUSCULAR
  Filled 2019-09-24: qty 2

## 2019-09-24 MED ORDER — FERROUS SULFATE 325 (65 FE) MG PO TABS: 325.0000 mg | ORAL_TABLET | Freq: Every day | ORAL | 3 refills | Status: AC

## 2019-09-24 MED ORDER — CHLORHEXIDINE GLUCONATE CLOTH 2 % EX PADS
6.0000 | MEDICATED_PAD | Freq: Every day | CUTANEOUS | Status: DC
  Administered 2019-10-03 – 2019-10-17 (×10): 6 via TOPICAL

## 2019-09-24 MED ORDER — OXYCODONE HCL 5 MG PO TABS
5.0000 mg | ORAL_TABLET | ORAL | Status: DC | PRN
  Administered 2019-09-25 – 2019-09-28 (×13): 5 mg via ORAL
  Filled 2019-09-24 (×15): qty 1

## 2019-09-24 MED ORDER — ACETAMINOPHEN 325 MG PO TABS
325.0000 mg | ORAL_TABLET | ORAL | Status: DC | PRN
  Administered 2019-09-30: 650 mg via ORAL
  Filled 2019-09-24 (×3): qty 2

## 2019-09-24 MED ORDER — SENNOSIDES-DOCUSATE SODIUM 8.6-50 MG PO TABS
2.0000 | ORAL_TABLET | Freq: Two times a day (BID) | ORAL | Status: DC
  Administered 2019-09-24: 2 via ORAL
  Filled 2019-09-24: qty 2

## 2019-09-24 MED ORDER — POLYETHYLENE GLYCOL 3350 17 G PO PACK: 17.0000 g | PACK | Freq: Every day | ORAL | Status: DC | PRN

## 2019-09-24 MED ORDER — APIXABAN 5 MG PO TABS: 5.0000 mg | ORAL_TABLET | Freq: Two times a day (BID) | ORAL | 2 refills | Status: AC

## 2019-09-24 NOTE — Care Management (Signed)
Benefits check placed for Eliquis 5 mg po BID for 60 days.  CMA to make note of cost.
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
## 2019-09-24 NOTE — H&P (Signed)
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 monitor- no leukocytosis; check for concerning signs and treat as appropriate.   Bary Leriche,  PA-C 09/24/2019    I have personally performed a face to face diagnostic evaluation of this patient and formulated the key components of the plan.  Additionally, I have personally reviewed laboratory data, imaging studies, as well as relevant notes and concur with the physician assistant's documentation above.   The patient's status has not changed from the original H&P.  Any changes in documentation from the acute care chart have been noted above.

## 2019-09-24 NOTE — Progress Notes (Signed)
Patient arrived at 1820, no complaints of pain at this p[oint, appears alert. Accompanied by her husband.

## 2019-09-24 NOTE — Progress Notes (Signed)
Inpatient Rehab Admissions Coordinator:   I have a bed available for pt to admit to CIR today.  Dr. Karleen Hampshire in agreement.  Will let pt/family and CM know.   Shann Medal, PT, DPT Admissions Coordinator [phone] 09/24/19  9:49 AM

## 2019-09-24 NOTE — Progress Notes (Signed)
Patient transferring to [id number], report given to Ed RN.
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
## 2019-09-24 NOTE — TOC Benefit Eligibility Note (Signed)
Transition of Care [facility]) Benefit Eligibility Note    Patient Details  Name: Tonya Hunter MRN: [id number] Date of Birth: October 27, 1934   Medication/Dose: Arne Cleveland  5 MG BID  Covered?: Yes  Tier: 3 Drug  Prescription Coverage Preferred Pharmacy: [facility]  Spoke with Person/Company/Phone Number:: ELBY  @ [place] [employer] # [phone]  Co-Pay: $482.50  Prior Approval: No  Deductible: Unmet       Memory [place] Phone Number: 09/24/2019, 10:19 AM

## 2019-09-25 ENCOUNTER — Inpatient Hospital Stay (HOSPITAL_COMMUNITY): Payer: Medicare Other

## 2019-09-25 ENCOUNTER — Inpatient Hospital Stay (HOSPITAL_COMMUNITY): Payer: Medicare Other | Admitting: Occupational Therapy

## 2019-09-25 ENCOUNTER — Encounter (HOSPITAL_COMMUNITY): Payer: Self-pay | Admitting: Physical Medicine and Rehabilitation

## 2019-09-25 DIAGNOSIS — S72401D Unspecified fracture of lower end of right femur, subsequent encounter for closed fracture with routine healing: Secondary | ICD-10-CM

## 2019-09-25 LAB — CBC WITH DIFFERENTIAL/PLATELET
Abs Immature Granulocytes: 0.03 10*3/uL (ref 0.00–0.07)
Basophils Absolute: 0 10*3/uL (ref 0.0–0.1)
Basophils Relative: 0 %
Eosinophils Absolute: 0.1 10*3/uL (ref 0.0–0.5)
Eosinophils Relative: 3 %
HCT: 30.2 % — ABNORMAL LOW (ref 36.0–46.0)
Hemoglobin: 9.4 g/dL — ABNORMAL LOW (ref 12.0–15.0)
Immature Granulocytes: 1 %
Lymphocytes Relative: 15 %
Lymphs Abs: 0.7 10*3/uL (ref 0.7–4.0)
MCH: 30.8 pg (ref 26.0–34.0)
MCHC: 31.1 g/dL (ref 30.0–36.0)
MCV: 99 fL (ref 80.0–100.0)
Monocytes Absolute: 0.4 10*3/uL (ref 0.1–1.0)
Monocytes Relative: 9 %
Neutro Abs: 3.3 10*3/uL (ref 1.7–7.7)
Neutrophils Relative %: 72 %
Platelets: 181 10*3/uL (ref 150–400)
RBC: 3.05 MIL/uL — ABNORMAL LOW (ref 3.87–5.11)
RDW: 21.1 % — ABNORMAL HIGH (ref 11.5–15.5)
WBC: 4.6 10*3/uL (ref 4.0–10.5)
nRBC: 0 % (ref 0.0–0.2)

## 2019-09-25 LAB — AMMONIA: Ammonia: 81 umol/L — ABNORMAL HIGH (ref 9–35)

## 2019-09-25 LAB — COMPREHENSIVE METABOLIC PANEL
ALT: 35 U/L (ref 0–44)
AST: 54 U/L — ABNORMAL HIGH (ref 15–41)
Albumin: 2.3 g/dL — ABNORMAL LOW (ref 3.5–5.0)
Alkaline Phosphatase: 262 U/L — ABNORMAL HIGH (ref 38–126)
Anion gap: 10 (ref 5–15)
BUN: 23 mg/dL (ref 8–23)
CO2: 21 mmol/L — ABNORMAL LOW (ref 22–32)
Calcium: 8.4 mg/dL — ABNORMAL LOW (ref 8.9–10.3)
Chloride: 99 mmol/L (ref 98–111)
Creatinine, Ser: 1.02 mg/dL — ABNORMAL HIGH (ref 0.44–1.00)
GFR calc Af Amer: 59 mL/min — ABNORMAL LOW (ref >60)
GFR calc non Af Amer: 51 mL/min — ABNORMAL LOW (ref >60)
Glucose, Bld: 208 mg/dL — ABNORMAL HIGH (ref 70–99)
Potassium: 3.8 mmol/L (ref 3.5–5.1)
Sodium: 130 mmol/L — ABNORMAL LOW (ref 135–145)
Total Bilirubin: 2.1 mg/dL — ABNORMAL HIGH (ref 0.3–1.2)
Total Protein: 5.9 g/dL — ABNORMAL LOW (ref 6.5–8.1)

## 2019-09-25 LAB — GLUCOSE, CAPILLARY
Glucose-Capillary: 207 mg/dL — ABNORMAL HIGH (ref 70–99)
Glucose-Capillary: 228 mg/dL — ABNORMAL HIGH (ref 70–99)
Glucose-Capillary: 170 mg/dL — ABNORMAL HIGH (ref 70–99)
Glucose-Capillary: 203 mg/dL — ABNORMAL HIGH (ref 70–99)

## 2019-09-25 LAB — PROTIME-INR
INR: 1.2 (ref 0.8–1.2)
Prothrombin Time: 14.8 seconds (ref 11.4–15.2)

## 2019-09-25 LAB — CYTOLOGY - NON PAP

## 2019-09-25 MED ORDER — LACTULOSE 10 GM/15ML PO SOLN
30.0000 g | Freq: Every day | ORAL | Status: DC
  Administered 2019-09-25 – 2019-10-02 (×8): 30 g via ORAL
  Filled 2019-09-25 (×8): qty 45

## 2019-09-25 MED ORDER — LIDOCAINE HCL URETHRAL/MUCOSAL 2 % EX GEL
1.0000 "application " | CUTANEOUS | Status: DC | PRN
  Filled 2019-09-25: qty 5
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
## 2019-09-25 NOTE — Plan of Care (Signed)
  Problem: Consults Goal: RH GENERAL PATIENT EDUCATION Description: See Patient Education module for education specifics. Outcome: Progressing Goal: Skin Care Protocol Initiated - if Braden Score 18 or less Description: If consults are not indicated, leave blank or document N/A Outcome: Progressing Goal: Nutrition Consult-if indicated Outcome: Progressing Goal: Diabetes Guidelines if Diabetic/Glucose > 140 Description: If diabetic or lab glucose is > 140 mg/dl - Initiate Diabetes/Hyperglycemia Guidelines & Document Interventions  Outcome: Progressing   Problem: RH BOWEL ELIMINATION Goal: RH STG MANAGE BOWEL WITH ASSISTANCE Description: STG Manage Bowel with Assistance. Outcome: Progressing Goal: RH STG MANAGE BOWEL W/MEDICATION W/ASSISTANCE Description: STG Manage Bowel with Medication with Assistance. Outcome: Progressing   Problem: RH BLADDER ELIMINATION Goal: RH STG MANAGE BLADDER WITH ASSISTANCE Description: STG Manage Bladder With Assistance Outcome: Progressing Goal: RH STG MANAGE BLADDER WITH MEDICATION WITH ASSISTANCE Description: STG Manage Bladder With Medication With Assistance. Outcome: Progressing Goal: RH STG MANAGE BLADDER WITH EQUIPMENT WITH ASSISTANCE Description: STG Manage Bladder With Equipment With Assistance Outcome: Progressing   Problem: RH SKIN INTEGRITY Goal: RH STG SKIN FREE OF INFECTION/BREAKDOWN Outcome: Progressing Goal: RH STG MAINTAIN SKIN INTEGRITY WITH ASSISTANCE Description: STG Maintain Skin Integrity With Assistance. Outcome: Progressing Goal: RH STG ABLE TO PERFORM INCISION/WOUND CARE W/ASSISTANCE Description: STG Able To Perform Incision/Wound Care With Assistance. Outcome: Progressing   Problem: RH SAFETY Goal: RH STG ADHERE TO SAFETY PRECAUTIONS W/ASSISTANCE/DEVICE Description: STG Adhere to Safety Precautions With Assistance/Device. Outcome: Progressing Goal: RH STG DECREASED RISK OF FALL WITH ASSISTANCE Description: STG  Decreased Risk of Fall With Assistance. Outcome: Progressing   Problem: RH PAIN MANAGEMENT Goal: RH STG PAIN MANAGED AT OR BELOW PT'S PAIN GOAL Outcome: Progressing   Problem: RH KNOWLEDGE DEFICIT GENERAL Goal: RH STG INCREASE KNOWLEDGE OF SELF CARE AFTER HOSPITALIZATION Outcome: Progressing

## 2019-09-25 NOTE — Progress Notes (Signed)
Discussed patient's case with Dr. Ellyn Hack. She does have LAD stent but with recent issues with bleeding and coagulopathy--to hold off on DAPT. Continue DOAC and follow up with cards on outpatient basis.

## 2019-09-25 NOTE — Progress Notes (Signed)
Patient laying down and trying to eat lunch. Patient was educated by NT and RN about possible aspiration and pneumonia.
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
## 2019-09-25 NOTE — H&P (Signed)
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 CAD s/p stent/AS: Per records lifelong DAPT recommended by Dr. Johnsie Cancel due to concerns of restenosis --resume as H/H improving?  13. Hyponatremia: Likely due to liver disease/fluid overload--improving 131-->127-->131 14. Abnormal LFTs: Recheck in am. Will also check ammonia levels and INR.  15. Sternal fx- will double check about sternal precautions.  16. Diarrhea- will need to monitor- no leukocytosis; check for concerning signs and treat as appropriate.    Bary Leriche, PA-C 09/24/2019      I have personally performed a face to face diagnostic evaluation of this patient and formulated the key components of the plan.  Additionally, I have personally reviewed laboratory data, imaging studies, as well as relevant notes and concur with the physician assistant's documentation above.   The patient's status has not changed from the original H&P.  Any changes in documentation from the acute care chart have been noted above.

## 2019-09-25 NOTE — Progress Notes (Signed)
Inpatient Rehabilitation  Patient information reviewed and entered into eRehab system by Brian Zeitlin M. Rahul Malinak, M.A., CCC/SLP, PPS Coordinator.  Information including medical coding, functional ability and quality indicators will be reviewed and updated through discharge.    
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
## 2019-09-25 NOTE — Progress Notes (Signed)
Pt arrived yesterday, has foley placed, discussed with Pam Love PA-C and will remove tomorrow.

## 2019-09-26 ENCOUNTER — Inpatient Hospital Stay (HOSPITAL_COMMUNITY): Payer: BLUE CROSS/BLUE SHIELD

## 2019-09-26 ENCOUNTER — Encounter (HOSPITAL_COMMUNITY): Payer: Self-pay | Admitting: Physical Medicine and Rehabilitation

## 2019-09-26 ENCOUNTER — Inpatient Hospital Stay (HOSPITAL_COMMUNITY): Payer: Medicare Other

## 2019-09-26 ENCOUNTER — Inpatient Hospital Stay (HOSPITAL_COMMUNITY): Payer: BLUE CROSS/BLUE SHIELD | Admitting: Occupational Therapy

## 2019-09-26 LAB — URINALYSIS, ROUTINE W REFLEX MICROSCOPIC
Glucose, UA: NEGATIVE mg/dL
Hgb urine dipstick: NEGATIVE
Ketones, ur: NEGATIVE mg/dL
Leukocytes,Ua: NEGATIVE
Nitrite: NEGATIVE
Protein, ur: NEGATIVE mg/dL
Specific Gravity, Urine: 1.035 — ABNORMAL HIGH (ref 1.005–1.030)
pH: 5 (ref 5.0–8.0)

## 2019-09-26 LAB — GLUCOSE, CAPILLARY
Glucose-Capillary: 164 mg/dL — ABNORMAL HIGH (ref 70–99)
Glucose-Capillary: 231 mg/dL — ABNORMAL HIGH (ref 70–99)
Glucose-Capillary: 250 mg/dL — ABNORMAL HIGH (ref 70–99)
Glucose-Capillary: 280 mg/dL — ABNORMAL HIGH (ref 70–99)

## 2019-09-26 LAB — PATHOLOGIST SMEAR REVIEW

## 2019-09-26 MED ORDER — INSULIN GLARGINE 100 UNIT/ML ~~LOC~~ SOLN
11.0000 [IU] | Freq: Every day | SUBCUTANEOUS | Status: DC
  Administered 2019-09-27 – 2019-09-28 (×2): 11 [IU] via SUBCUTANEOUS
  Filled 2019-09-26 (×3): qty 0.11

## 2019-09-26 NOTE — Progress Notes (Signed)
Physical Therapy Session Note  Patient Details  Name: Tonya Hunter MRN: [id number] Date of Birth: [birth year]  Today's Date: 09/26/2019 PT Individual Time: 0175-1025 and 8527-7824 PT Individual Time Calculation (min): 39 min and 23 min  Short Term Goals: Week 1:  PT Short Term Goal 1 (Week 1): Pt will perform supine<>sit with modA PT Short Term Goal 2 (Week 1): Pt will perform bed<>WC with slideboard and modA. PT Short Term Goal 3 (Week 1): Pt will perform sit to stand with maxA. PT Short Term Goal 4 (Week 1): Pt will perform 50' WC propulsion with minA.  Skilled Therapeutic Interventions/Progress Updates:     1st Session: Pt supine in bed and agreeable to therapy. Reports significant pain in RLE>LLE. Number not provided. RN aware and PT provides repositioning, mobility, elevation, and ice to address pain. Pt performs bilateral rolling to perform brief change with PT cuing for logrolling technique and providing maxA. Supine>sit with totalA and pt crying upon sitting EOB due to complaint of pain and saying "I can't do this". PT provides calm encouragement and eventually pt agreeable to attempt bed>WC transfer, but refuses use of slideboard. Lateral pop-over/squat pivot transfer performed to L with PT providing totalA. Pt left seated in WC with alarm intact and all needs within reach.  2nd Session: Pt received seated in recliner and requesting to return to bed due to pain in distal RLE. Pt crying in pain each time PT adjusts RLE in preparation for transfer. Pt performs lateral transfer from [facility] to bed with totalA from PT. Pt sits at EOB ~10 minutes and performs seated scoots posteriorly and to the R but is only able to progress 1-2 inches in total. Pt returns to supine with totalA and dependent assist to perform supine scoot to [facility]. PT positions pt for pressure relief of B heels and positions ice for RLE. Pt left supine in bed with alarm intact and all needs within reach.  Therapy  Documentation Precautions:  Precautions Precautions: Fall Precaution Comments: Pt h/o confusion Required Braces or Orthoses: Other Brace Other Brace: AFO Restrictions Weight Bearing Restrictions: Yes RLE Weight Bearing: Touchdown weight bearing Other Position/Activity Restrictions: RLE   Therapy/Group: Individual Therapy  Breck Coons, PT, DPT 09/26/2019, 3:51 PM
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
## 2019-09-26 NOTE — Care Management (Signed)
Inpatient [facility] Individual Statement of Services  Patient Name:  Tonya Hunter  Date:  09/26/2019  Welcome to the [facility].  Our goal is to provide you with an individualized program based on your diagnosis and situation, designed to meet your specific needs.  With this comprehensive rehabilitation program, you will be expected to participate in at least 3 hours of rehabilitation therapies Monday-Friday, with modified therapy programming on the weekends.  Your rehabilitation program will include the following services:  Physical Therapy (PT), Occupational Therapy (OT), Speech Therapy (ST), 24 hour per day rehabilitation nursing, Therapeutic Recreaction (TR), Psychology, Neuropsychology, Care Coordinator, Rehabilitation Medicine, Nutrition Services, Pharmacy Services and Other  Weekly team conferences will be held on Tuesdays to discuss your progress.  Your Inpatient Rehabilitation Care Coordinator will talk with you frequently to get your input and to update you on team discussions.  Team conferences with you and your family in attendance may also be held.  Expected length of stay: 2.5-3 weeks   Overall anticipated outcome: Minimal Assistance  Depending on your progress and recovery, your program may change. Your Inpatient Rehabilitation Care Coordinator will coordinate services and will keep you informed of any changes. Your Inpatient Rehabilitation Care Coordinator's name and contact numbers are listed  below.  The following services may also be recommended but are not provided by the [facility] will be made to provide these services after discharge if needed.  Arrangements include referral to agencies that provide these services.  Your insurance has been verified to be:  Medicare A/B and  [place]  Your primary doctor is:  [name]  Pertinent information will be shared with your doctor and your insurance company.  Inpatient Rehabilitation Care Coordinator:  Cathleen Corti [phone] or ([phone]  Information discussed with and copy given to patient by: Rana Snare, 09/26/2019, 10:42 AM

## 2019-09-26 NOTE — Progress Notes (Signed)
Speech Language Pathology Daily Session Note  Patient Details  Name: Tonya Hunter MRN: [id number] Date of Birth: 31-Jul-1934  Today's Date: 09/26/2019 SLP Individual Time: 1003-4961 SLP Individual Time Calculation (min): 41 min  Short Term Goals: Week 1: SLP Short Term Goal 1 (Week 1): Pt will demonstrate sustained attention during functional tasks in 10 minute intervals with min A verbal cues for redirection. SLP Short Term Goal 2 (Week 1): Pt will complete basic to mildly complex functional problem solving tasks with mod A verbal cues. SLP Short Term Goal 3 (Week 1): Pt will demonstrate error awareness and self correction or errors with max A verbal cues. SLP Short Term Goal 4 (Week 1): Pt will demonstrate recall of novel, daily information with use of visual aids given min A verbal cues.  Skilled Therapeutic Interventions: Skilled ST services focused on cogntitive skills. PT was present adjusting position of right leg to aid in comfort while sitting. Pt expressed pain throughout session, in which PT, then SLP spend majority of session readjust positioning. Pt was distracted by right leg pain but intermittently participated in [place] services. SLP facilitated basic problem solving and sustained attention skills utilizing basic money management task (ALFA), pt required max A verbal/visual cues to display/sort change and max A verbal cues to encourage participation. Pt acknowledged deficits, but was unwilling to fix function errors, expressing due to pain. Pt did demonstrate islands of language confusion, but majority of treatment session speech remained clear. SLP assisted pt with PO consumption of soup pt, pt request. Pt was left in room with call bell within reach and chair alarm set. ST recommends to continue skilled ST services.      Pain Pain Assessment Pain Score: Asleep  Therapy/Group: Individual Therapy  Eduardo Wurth  [facility] 09/26/2019, 7:37 AM

## 2019-09-26 NOTE — Progress Notes (Signed)
Occupational Therapy Session Note  Patient Details  Name: Tonya Hunter MRN: [id number] Date of Birth: 01/14/1935  Today's Date: 09/26/2019 OT Individual Time: 1138-1153 OT Individual Time Calculation (min): 15 min  and Today's Date: 09/26/2019 OT Missed Time: 60 Minutes Missed Time Reason: Pain   Short Term Goals: Week 1:  OT Short Term Goal 1 (Week 1): Patient will thread BLE into pants with Mod A and use of AE/DME as needed. OT Short Term Goal 2 (Week 1): Patient will complete SB transfer with 1 person assist. OT Short Term Goal 3 (Week 1): Patient will complete toilet transfer to BSC with SB and 1 person assist with adherence to RLE weightbearing precautions. OT Short Term Goal 4 (Week 1): Patient will complete 1/3 parts of toileting with 1 person assist.  Skilled Therapeutic Interventions/Progress Updates:    Pt off the unit for ultrasound when OT first attempted to see pt. OT returned later in the morning and greeted pt semi-reclined in bed. Pt reported 9/10 pain to R LE and overall discomfort. Pt stated he pain meds had not done anything for her pain. OT tried to encourage pt to get OOB or at least try to sit EOB, but she refused. Pt agreeable to wash her face in bed with set up A. OT educated on importance of mobility for strength and progression towards dc home. Pt continued to refuse. Pt asked OT to help her sit up further in bed. Pt was able to come to long sitting from ~40 degrees using bed rails and maintain position while OT placed pillows and raised HOB. Pt was left semi-reclined in bed with bed alarm on and needs met.   Therapy Documentation Precautions:  Precautions Precautions: Fall Precaution Comments: Pt h/o confusion Required Braces or Orthoses: Other Brace Other Brace: AFO Restrictions Weight Bearing Restrictions: Yes RLE Weight Bearing: Touchdown weight bearing Other Position/Activity Restrictions: RLE General:   Vital Signs:  Pain:   ADL: ADL Eating: Set  up Where Assessed-Eating: Bed level Grooming: Setup Where Assessed-Grooming: Bed level Upper Body Bathing: Moderate assistance Where Assessed-Upper Body Bathing: Edge of bed Lower Body Bathing: Other (comment)(+2 helpers) Where Assessed-Lower Body Bathing: Bed level Upper Body Dressing: Moderate assistance Where Assessed-Upper Body Dressing: Edge of bed Lower Body Dressing: Dependent Where Assessed-Lower Body Dressing: Bed level Toileting: Dependent Where Assessed-Toileting: Bed level Toilet Transfer: Unable to assess Toilet Transfer Method: Unable to assess Tub/Shower Transfer: Unable to assess Walk-In Shower Transfer: Unable to assess ADL Comments: Patient currently Mod A with UB ADLs and +2 assist for LB ADLs at bed level Vision   Perception    Praxis   Exercises:   Other Treatments:     Therapy/Group: Individual Therapy   S  09/26/2019, 12:18 PM 

## 2019-09-26 NOTE — Plan of Care (Signed)
  Problem: Consults Goal: RH GENERAL PATIENT EDUCATION Description: See Patient Education module for education specifics. Outcome: Progressing Goal: Skin Care Protocol Initiated - if Braden Score 18 or less Description: If consults are not indicated, leave blank or document N/A Outcome: Progressing Goal: Nutrition Consult-if indicated Outcome: Progressing Goal: Diabetes Guidelines if Diabetic/Glucose > 140 Description: If diabetic or lab glucose is > 140 mg/dl - Initiate Diabetes/Hyperglycemia Guidelines & Document Interventions  Outcome: Progressing   Problem: RH BOWEL ELIMINATION Goal: RH STG MANAGE BOWEL WITH ASSISTANCE Description: STG Manage Bowel with Assistance. Outcome: Progressing Goal: RH STG MANAGE BOWEL W/MEDICATION W/ASSISTANCE Description: STG Manage Bowel with Medication with Assistance. Outcome: Progressing   Problem: RH BLADDER ELIMINATION Goal: RH STG MANAGE BLADDER WITH ASSISTANCE Description: STG Manage Bladder With Assistance Outcome: Progressing Goal: RH STG MANAGE BLADDER WITH MEDICATION WITH ASSISTANCE Description: STG Manage Bladder With Medication With Assistance. Outcome: Progressing Goal: RH STG MANAGE BLADDER WITH EQUIPMENT WITH ASSISTANCE Description: STG Manage Bladder With Equipment With Assistance Outcome: Progressing   Problem: RH SKIN INTEGRITY Goal: RH STG SKIN FREE OF INFECTION/BREAKDOWN Outcome: Progressing Goal: RH STG MAINTAIN SKIN INTEGRITY WITH ASSISTANCE Description: STG Maintain Skin Integrity With Assistance. Outcome: Progressing Goal: RH STG ABLE TO PERFORM INCISION/WOUND CARE W/ASSISTANCE Description: STG Able To Perform Incision/Wound Care With Assistance. Outcome: Progressing   Problem: RH SAFETY Goal: RH STG ADHERE TO SAFETY PRECAUTIONS W/ASSISTANCE/DEVICE Description: STG Adhere to Safety Precautions With Assistance/Device. Outcome: Progressing Goal: RH STG DECREASED RISK OF FALL WITH ASSISTANCE Description: STG  Decreased Risk of Fall With Assistance. Outcome: Progressing   Problem: RH PAIN MANAGEMENT Goal: RH STG PAIN MANAGED AT OR BELOW PT'S PAIN GOAL Outcome: Progressing

## 2019-09-27 ENCOUNTER — Inpatient Hospital Stay (HOSPITAL_COMMUNITY): Payer: BLUE CROSS/BLUE SHIELD

## 2019-09-27 ENCOUNTER — Inpatient Hospital Stay (HOSPITAL_COMMUNITY): Payer: BLUE CROSS/BLUE SHIELD | Admitting: Occupational Therapy

## 2019-09-27 ENCOUNTER — Inpatient Hospital Stay (HOSPITAL_COMMUNITY): Payer: BLUE CROSS/BLUE SHIELD | Admitting: Speech Pathology

## 2019-09-27 ENCOUNTER — Inpatient Hospital Stay (HOSPITAL_COMMUNITY): Payer: Medicare Other

## 2019-09-27 HISTORY — PX: IR PARACENTESIS: IMG2679

## 2019-09-27 LAB — BODY FLUID CELL COUNT WITH DIFFERENTIAL
Eos, Fluid: 0 %
Lymphs, Fluid: 66 %
Monocyte-Macrophage-Serous Fluid: 26 % — ABNORMAL LOW (ref 50–90)
Neutrophil Count, Fluid: 8 % (ref 0–25)
Total Nucleated Cell Count, Fluid: 113 cu mm (ref 0–1000)

## 2019-09-27 LAB — URINE CULTURE: Culture: NO GROWTH

## 2019-09-27 LAB — GLUCOSE, CAPILLARY
Glucose-Capillary: 156 mg/dL — ABNORMAL HIGH (ref 70–99)
Glucose-Capillary: 181 mg/dL — ABNORMAL HIGH (ref 70–99)
Glucose-Capillary: 144 mg/dL — ABNORMAL HIGH (ref 70–99)
Glucose-Capillary: 221 mg/dL — ABNORMAL HIGH (ref 70–99)

## 2019-09-27 LAB — LACTATE DEHYDROGENASE, PLEURAL OR PERITONEAL FLUID: LD, Fluid: 34 U/L — ABNORMAL HIGH (ref 3–23)

## 2019-09-27 MED ORDER — LIDOCAINE HCL 1 % IJ SOLN
INTRAMUSCULAR | Status: DC | PRN
  Administered 2019-09-27: 10 mL

## 2019-09-27 MED ORDER — ALBUMIN HUMAN 25 % IV SOLN: 25.0000 g | Freq: Once | INTRAVENOUS | Status: DC

## 2019-09-27 MED ORDER — SPIRONOLACTONE 12.5 MG HALF TABLET
12.5000 mg | ORAL_TABLET | Freq: Two times a day (BID) | ORAL | Status: DC
  Administered 2019-09-27 – 2019-09-30 (×7): 12.5 mg via ORAL
  Filled 2019-09-27 (×8): qty 1

## 2019-09-27 MED ORDER — LIDOCAINE HCL 1 % IJ SOLN
INTRAMUSCULAR | Status: AC
  Filled 2019-09-27: qty 20

## 2019-09-27 MED ORDER — ALBUMIN HUMAN 25 % IV SOLN
25.0000 g | Freq: Once | INTRAVENOUS | Status: AC
  Administered 2019-09-28: 25 g via INTRAVENOUS
  Filled 2019-09-27: qty 100

## 2019-09-27 MED ORDER — TRAMADOL HCL 50 MG PO TABS
50.0000 mg | ORAL_TABLET | Freq: Four times a day (QID) | ORAL | Status: DC | PRN
  Administered 2019-09-29 – 2019-10-20 (×6): 50 mg via ORAL
  Filled 2019-09-27 (×7): qty 1
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
## 2019-09-27 NOTE — Progress Notes (Signed)
Occupational Therapy Session Note  Patient Details  Name: Tonya Hunter MRN: [id number] Date of Birth: [birth year]  Today's Date: 09/27/2019 OT Individual Time: 1300-1356 OT Individual Time Calculation (min): 56 min    Short Term Goals: Week 1:  OT Short Term Goal 1 (Week 1): Patient will thread BLE into pants with Mod A and use of AE/DME as needed. OT Short Term Goal 2 (Week 1): Patient will complete SB transfer with 1 person assist. OT Short Term Goal 3 (Week 1): Patient will complete toilet transfer to [facility] with SB and 1 person assist with adherence to RLE weightbearing precautions. OT Short Term Goal 4 (Week 1): Patient will complete 1/3 parts of toileting with 1 person assist.  Skilled Therapeutic Interventions/Progress Updates:    Pt greeted semi-reclined in bed just about to eat lunch. Pt needed OT assistance to open containers, but could then otherwise manage feeding utensils. Pt reported generalized pain at an 8/10. Pt needed max encouragement to participate further in therapy. Pt agreeable to sit EOB. Pt was able to advance LEs towards EOB, but reported severe pain throughout transition. Max A to then elevate trunk into sitting. Pt tolerated sitting EOB for ~ 10 minutes. Pt declined any bathing/dressing or even changing into her gown from home despite encouragement from OT. Pt agreeable to there-ex at EOB. Pt completed 1 set of 10 knee extension on L and R side. Pt very slow with movements and needed extended time in between reps. Alternating arm raises to tap OT's hands with decreased ROM on L side. Pt reported max pain and fatigue and requested to return to bed. Had pt practice scooting along EOB with pt able to lift bottom slightly, but needed OT assist with chuck pad to better scoot.  Pt needed total A to return to supine and was very fearful of pain throughout transition. Pt left in bed with HOB elevated all the way and pillows behind back for comfort.   Therapy  Documentation Precautions:  Precautions Precautions: Fall Precaution Comments: Pt h/o confusion Required Braces or Orthoses: Other Brace Other Brace: AFO Restrictions Weight Bearing Restrictions: Yes RLE Weight Bearing: Touchdown weight bearing Other Position/Activity Restrictions: RLE Pain:  Pt reports 8/10 generalized pain. Pain increased with bed mobility, mostly in back. Pt repositioned and rest for comfort and pain management.   Therapy/Group: Individual Therapy  Valma Cava 09/27/2019, 2:01 PM

## 2019-09-27 NOTE — Progress Notes (Signed)
Physical Therapy Session Note  Patient Details  Name: Tonya Hunter MRN: [id number] Date of Birth: [birth year]  Today's Date: 09/27/2019 PT Missed Time: 31 Minutes Missed Time Reason: Unavailable (Comment)(Leaving floor for paracentesis)   Pt awaiting transport to leave floor for paracentesis. PT to follow up.   Therapy/Group: Individual Therapy  Breck Coons, PT, DPT 09/27/2019, 10:37 AM

## 2019-09-27 NOTE — Procedures (Signed)
Ultrasound-guided diagnostic and therapeutic paracentesis performed yielding 2.3 liters of straw colored fluid.  Fluid was sent to lab for analysis. No immediate complications. EBL is none.

## 2019-09-27 NOTE — Progress Notes (Signed)
SLP Cancellation Note  Patient Details Name: Tonya Hunter MRN: [id number] DOB: Jul 31, 1934   Cancelled treatment:       Patient missed 30 minutes of skilled SLP intervention due to unwillingness to participate. Patient declined participation despite Max encouragement and reported, "she came by yesterday." Patient left upright in bed with alarm on and all needs within reach. Continue with current plan of care.                                                                                                  [place], [place] 09/27/2019, 3:24 PM

## 2019-09-27 NOTE — Progress Notes (Signed)
Discussed patient with Dr. [name] ([facility] hospitalist) for input on IV albumin due to recurrent ascites. He recommended resumption of diuretics to prevent recurrence and advised on IV albumin 25 mg/50 ml once prior to ascites due to low albumin/prevent recurrence.   Patient's BP now over 100 --will resume aldactone at 25 mg bid today. Will resume lasix in 24-48 hrs if BP stable.
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
## 2019-09-27 NOTE — Progress Notes (Signed)
Physical Therapy Session Note  Patient Details  Name: Tonya Hunter MRN: [id number] Date of Birth: 11-04-1934  Today's Date: 09/27/2019 PT Missed Time: 2 Minutes Missed Time Reason: Patient unwilling to participate  Skilled Therapeutic Interventions/Progress Updates:     Pt refusing PT at this time due to "just getting to a comfortable spot in bed." PT educates on importance of participation and pt says she has already sat on side of bed twice today. Continues to refuse PT at this time.  Therapy/Group: Individual Therapy  Breck Coons, PT, DPT 09/27/2019, 4:07 PM

## 2019-09-28 ENCOUNTER — Inpatient Hospital Stay (HOSPITAL_COMMUNITY): Payer: BLUE CROSS/BLUE SHIELD | Admitting: Physical Therapy

## 2019-09-28 ENCOUNTER — Inpatient Hospital Stay (HOSPITAL_COMMUNITY): Payer: BLUE CROSS/BLUE SHIELD | Admitting: Speech Pathology

## 2019-09-28 LAB — GRAM STAIN

## 2019-09-28 LAB — GLUCOSE, CAPILLARY
Glucose-Capillary: 183 mg/dL — ABNORMAL HIGH (ref 70–99)
Glucose-Capillary: 180 mg/dL — ABNORMAL HIGH (ref 70–99)
Glucose-Capillary: 103 mg/dL — ABNORMAL HIGH (ref 70–99)
Glucose-Capillary: 154 mg/dL — ABNORMAL HIGH (ref 70–99)

## 2019-09-28 LAB — CULTURE, BODY FLUID W GRAM STAIN -BOTTLE: Culture: NO GROWTH

## 2019-09-28 MED ORDER — INSULIN GLARGINE 100 UNIT/ML ~~LOC~~ SOLN
12.0000 [IU] | Freq: Every day | SUBCUTANEOUS | Status: DC
  Administered 2019-09-29: 12 [IU] via SUBCUTANEOUS
  Filled 2019-09-28 (×2): qty 0.12

## 2019-09-28 NOTE — Progress Notes (Signed)
Physical Therapy Session Note  Patient Details  Name: Tonya Hunter MRN: [id number] Date of Birth: [birth year]  Today's Date: 09/28/2019 PT Missed Time: 80 Minutes Missed Time Reason: Patient fatigue  Therapist spoke with Ed, RN, prior to entering pt's room and he reports having just assisted pt into a comfortable position to which she stated it was the first time in 2 days she has gotten in a comfortable position without pain. Upon arrival to pt's room she was sleeping - therapist notified RN and he requested to hold therapy at this time as pt had not slept well in 2 days. Missed 60 minutes of skilled physical therapy.  Tawana Scale, PT, DPT 09/28/2019, 3:52 PM

## 2019-09-28 NOTE — Progress Notes (Signed)
Physical Therapy Session Note  Patient Details  Name: Tonya Hunter MRN: [id number] Date of Birth: 1934/12/23  Today's Date: 09/28/2019 PT Individual Time: 0902-1000 PT Individual Time Calculation (min): 58 min   Short Term Goals: Week 1:  PT Short Term Goal 1 (Week 1): Pt will perform supine<>sit with modA PT Short Term Goal 2 (Week 1): Pt will perform bed<>WC with slideboard and modA. PT Short Term Goal 3 (Week 1): Pt will perform sit to stand with maxA. PT Short Term Goal 4 (Week 1): Pt will perform 50' WC propulsion with minA. Week 2:     Skilled Therapeutic Interventions/Progress Updates:   Pt received supine in bed and agreeable to PT at bed level. Pt noted to bed close to foot of bed. Bed reclined to flat and transitioned into trendelenburg to scoot pt to [facility] with total A. Throughout scoot, pt demonstrated poor situational awareness, by constantly requesting to return to sitting position prior to scoot. Pt declined sitting EOB to attempting transfer this AM. Once near [facility], PT instructed pt in BLE Therex AROM on the LLE and AAROM on the R with prolonged breaks between bouts and constant encouragement to complete set. Ankle DF x 15, SAQ x 10, hip abduction x 10, hip/knee flexion/extension x 10 within available range. DF stretch to R heel cord 2 x 1 min. UE therex with 3# bar shoulder flexion, chest press, and bicep curl x 10 each with cues for improved ROM and proper speed. Pt left in bed with call bell in reach and husband present.   Therapy Documentation Precautions:  Precautions Precautions: Fall Precaution Comments: Pt h/o confusion Required Braces or Orthoses: Other Brace Other Brace: AFO Restrictions Weight Bearing Restrictions: Yes RLE Weight Bearing: Touchdown weight bearing Other Position/Activity Restrictions: RLE    Vital Signs: Therapy Vitals Temp: 97.6 F (36.4 C) Pulse Rate: 79 Resp: 15 BP: (!) 108/91 Patient Position (if appropriate): Lying Oxygen  Therapy SpO2: 98 % O2 Device: Room Air Pain: 9/10 RLE. See MAR    Therapy/Group: Individual Therapy  Lorie Phenix 09/28/2019, 9:59 AM

## 2019-09-28 NOTE — Progress Notes (Signed)
Speech Language Pathology Daily Session Note  Patient Details  Name: Tonya Hunter MRN: [id number] Date of Birth: [birth year]  Today's Date: 09/28/2019 SLP Individual Time: 1350-1447 SLP Individual Time Calculation (min): 57 min  Short Term Goals: Week 1: SLP Short Term Goal 1 (Week 1): Pt will demonstrate sustained attention during functional tasks in 10 minute intervals with min A verbal cues for redirection. SLP Short Term Goal 2 (Week 1): Pt will complete basic to mildly complex functional problem solving tasks with mod A verbal cues. SLP Short Term Goal 3 (Week 1): Pt will demonstrate error awareness and self correction or errors with max A verbal cues. SLP Short Term Goal 4 (Week 1): Pt will demonstrate recall of novel, daily information with use of visual aids given min A verbal cues.  Skilled Therapeutic Interventions:  Pt was seen for skilled ST targeting cognitive goals.  Pt was resting in bed upon therapist's arrival.  She reported pain below knees in both legs but communication board in pt's room indicates that pt was not yet due for pain meds.  During functional conversations with therapist targeting memory and sustained attention, pt was able to recall one general detail from am PT therapy session with min question cues.  When engaged in topics related to her personal interests, pt's mentation became much clearer and pt was able to recall a recipe with min question cues but would become perseverative on sharing the recipe, even after having moved on to another topic of conversation.   Pt was left in bed with bed alarm set and call bell within reach.  Continue per current plan fo care.    Pain Pain Assessment Pain Scale: 0-10 Pain Score: 7  Pain Location: Leg Pain Descriptors / Indicators: Aching Pain Intervention(s): Other (Comment)(premedicated)  Therapy/Group: Individual Therapy  Trish Mancinelli, Selinda Orion 09/28/2019, 2:55 PM
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
## 2019-09-29 LAB — GLUCOSE, CAPILLARY
Glucose-Capillary: 150 mg/dL — ABNORMAL HIGH (ref 70–99)
Glucose-Capillary: 178 mg/dL — ABNORMAL HIGH (ref 70–99)
Glucose-Capillary: 153 mg/dL — ABNORMAL HIGH (ref 70–99)
Glucose-Capillary: 262 mg/dL — ABNORMAL HIGH (ref 70–99)

## 2019-09-29 MED ORDER — OXYCODONE HCL 5 MG PO TABS
10.0000 mg | ORAL_TABLET | ORAL | Status: DC | PRN
  Administered 2019-09-29 – 2019-10-11 (×23): 10 mg via ORAL
  Filled 2019-09-29 (×23): qty 2

## 2019-09-29 MED ORDER — INSULIN GLARGINE 100 UNIT/ML ~~LOC~~ SOLN
13.0000 [IU] | Freq: Every day | SUBCUTANEOUS | Status: DC
  Administered 2019-09-30 – 2019-10-17 (×18): 13 [IU] via SUBCUTANEOUS
  Filled 2019-09-29 (×19): qty 0.13
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
## 2019-09-30 ENCOUNTER — Inpatient Hospital Stay (HOSPITAL_COMMUNITY): Payer: BLUE CROSS/BLUE SHIELD | Admitting: Occupational Therapy

## 2019-09-30 ENCOUNTER — Inpatient Hospital Stay (HOSPITAL_COMMUNITY): Payer: BLUE CROSS/BLUE SHIELD

## 2019-09-30 ENCOUNTER — Encounter (HOSPITAL_COMMUNITY): Payer: BLUE CROSS/BLUE SHIELD | Admitting: Psychology

## 2019-09-30 LAB — CBC
HCT: 34 % — ABNORMAL LOW (ref 36.0–46.0)
Hemoglobin: 10.7 g/dL — ABNORMAL LOW (ref 12.0–15.0)
MCH: 31.7 pg (ref 26.0–34.0)
MCHC: 31.5 g/dL (ref 30.0–36.0)
MCV: 100.6 fL — ABNORMAL HIGH (ref 80.0–100.0)
Platelets: 151 10*3/uL (ref 150–400)
RBC: 3.38 MIL/uL — ABNORMAL LOW (ref 3.87–5.11)
RDW: 21.1 % — ABNORMAL HIGH (ref 11.5–15.5)
WBC: 4.3 10*3/uL (ref 4.0–10.5)
nRBC: 0 % (ref 0.0–0.2)

## 2019-09-30 LAB — GLUCOSE, CAPILLARY
Glucose-Capillary: 379 mg/dL — ABNORMAL HIGH (ref 70–99)
Glucose-Capillary: 131 mg/dL — ABNORMAL HIGH (ref 70–99)
Glucose-Capillary: 169 mg/dL — ABNORMAL HIGH (ref 70–99)
Glucose-Capillary: 161 mg/dL — ABNORMAL HIGH (ref 70–99)

## 2019-09-30 LAB — BASIC METABOLIC PANEL
Anion gap: 8 (ref 5–15)
BUN: 26 mg/dL — ABNORMAL HIGH (ref 8–23)
CO2: 21 mmol/L — ABNORMAL LOW (ref 22–32)
Calcium: 8.7 mg/dL — ABNORMAL LOW (ref 8.9–10.3)
Chloride: 100 mmol/L (ref 98–111)
Creatinine, Ser: 0.98 mg/dL (ref 0.44–1.00)
GFR calc Af Amer: >60 mL/min (ref >60)
GFR calc non Af Amer: 54 mL/min — ABNORMAL LOW (ref >60)
Glucose, Bld: 195 mg/dL — ABNORMAL HIGH (ref 70–99)
Potassium: 4.4 mmol/L (ref 3.5–5.1)
Sodium: 129 mmol/L — ABNORMAL LOW (ref 135–145)

## 2019-09-30 MED ORDER — ONDANSETRON HCL 4 MG/2ML IJ SOLN
4.0000 mg | Freq: Four times a day (QID) | INTRAMUSCULAR | Status: DC | PRN
  Administered 2019-09-30 – 2019-10-28 (×5): 4 mg via INTRAVENOUS
  Filled 2019-09-30 (×6): qty 2

## 2019-09-30 MED ORDER — FUROSEMIDE 20 MG PO TABS
20.0000 mg | ORAL_TABLET | Freq: Every day | ORAL | Status: DC
  Administered 2019-09-30 – 2019-10-10 (×11): 20 mg via ORAL
  Filled 2019-09-30 (×11): qty 1

## 2019-09-30 MED ORDER — SPIRONOLACTONE 25 MG PO TABS
25.0000 mg | ORAL_TABLET | Freq: Two times a day (BID) | ORAL | Status: DC
  Administered 2019-09-30 – 2019-10-15 (×30): 25 mg via ORAL
  Filled 2019-09-30 (×30): qty 1

## 2019-09-30 NOTE — Plan of Care (Signed)
  Problem: RH Problem Solving Goal: LTG Patient will demonstrate problem solving for (SLP) Description: LTG:  Patient will demonstrate problem solving for basic/complex daily situations with cues  (SLP) Flowsheets (Taken 09/30/2019 1554) LTG: Patient will demonstrate problem solving for (SLP): Basic daily situations   Problem: RH Attention Goal: LTG Patient will demonstrate this level of attention during functional activites (SLP) Description: LTG:  Patient will will demonstrate this level of attention during functional activites (SLP) Flowsheets Taken 09/30/2019 1554 Patient will demonstrate during cognitive/linguistic activities the attention type of: Sustained Number of minutes patient will demonstrate attention during cognitive/linguistic activities: 15 minutes Taken 09/25/2019 1219 Patient will demonstrate this level of attention during cognitive/linguistic activities in: Controlled LTG: Patient will demonstrate this level of attention during cognitive/linguistic activities with assistance of (SLP): Supervision
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
## 2019-09-30 NOTE — Progress Notes (Addendum)
Speech Language Pathology Weekly Progress and Session Note  Patient Details  Name: Tonya Hunter MRN: [id number] Date of Birth: 1934/09/24  Beginning of progress report period: Sep 24, 2019 End of progress report period: Sep 30, 2019  Today's Date: 09/30/2019 SLP Individual Time: 5859-2924 SLP Individual Time Calculation (min): 56 min  Short Term Goals: Week 1: SLP Short Term Goal 1 (Week 1): Pt will demonstrate sustained attention during functional tasks in 10 minute intervals with min A verbal cues for redirection. SLP Short Term Goal 1 - Progress (Week 1): Not met SLP Short Term Goal 2 (Week 1): Pt will complete basic to mildly complex functional problem solving tasks with mod A verbal cues. SLP Short Term Goal 2 - Progress (Week 1): Not met SLP Short Term Goal 3 (Week 1): Pt will demonstrate error awareness and self correction or errors with max A verbal cues. SLP Short Term Goal 3 - Progress (Week 1): Not met SLP Short Term Goal 4 (Week 1): Pt will demonstrate recall of novel, daily information with use of visual aids given min A verbal cues. SLP Short Term Goal 4 - Progress (Week 1): Not met    New Short Term Goals: Week 2: SLP Short Term Goal 1 (Week 2): Pt will demonstrate sustained attention during functional tasks in 10 minute intervals with min A verbal cues for redirection. SLP Short Term Goal 2 (Week 2): Pt will demonstrate recall of novel, daily information with use of visual aids given min A verbal cues. SLP Short Term Goal 3 (Week 2): Pt will complete basic functional problem solving tasks with min A verbal cues. SLP Short Term Goal 4 (Week 2): Pt will list 2 cognitive deficits and thier impact on function with min A verbal cues.  Weekly Progress Updates: Pt did met 0 out 4 goals this reporting period due to reduced and limited participation impacted by behavior and pain. Pt requires max encouragement to participate in skilled ST services when pain is and is not an  influential factor. Pt demonstrated fluctuating cognitive abilities and islands of language of confusion as well as reduced intellectual awareness. SLP downgraded long term goal levels to better reflect demonstrated skills level. SLP will focus skilled treatment on functional tasks in basic problem solving, awareness, recall and sustained attention. Pt would continue to benefit from skills ST services in order to maximumize functional indepdence and reduce burden of care, requring 24 hour supervision and continued ST services.     Intensity: Minumum of 1-2 x/day, 30 to 90 minutes Frequency: 3 to 5 out of 7 days Duration/Length of Stay: 2-3 weeks Treatment/Interventions: Cognitive remediation/compensation;Cueing hierarchy;Functional tasks;Medication managment;Patient/family education;Internal/external aids   Daily Session  Skilled Therapeutic Interventions:  Skilled ST services focused on cognitive skills. SLP facilitated functional recall and problem solving with current medication. Pt required mod A verbal cues for verbal problem solving of current medication but required max A verbal cues to navigate medication list. Pt states she uses a medication list at home and removes pill from bottles, demonstrating reduced insight into deficits pertaining to her ability to complete this task with current deficits. SLP also facilitated sustained attention and basic problem solving utilizing card sorting task by colors, pt was able to sort cards by 3 colors with cues for sustained attention in 1 minute intervals. Pt continued to deny reduced attention and or fatigue (closing eyes and requiring verbal cuing) upon questioned " I was just resting my eyes." SLP provided assistance adjusting pillows for 10 minutes,  in which pt was not able to efficiently express wants/needs instead repeating the same phrase despite cuing for redirection. Pt was left in room with call bell within reach and bed alarm set. ST recommends  to continue skilled ST services.     General    Pain Pain Assessment Pain Score: 0-No pain  Therapy/Group: Individual Therapy  Tonya Hunter  [facility] 09/30/2019, 5:09 PM
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
## 2019-09-30 NOTE — Plan of Care (Signed)
  Problem: RH Awareness Goal: LTG: Patient will demonstrate awareness during functional activites type of (SLP) Description: LTG: Patient will demonstrate awareness during functional activites type of (SLP) Flowsheets (Taken 09/30/2019 1707) Patient will demonstrate during cognitive/linguistic activities awareness type of: Intellectual LTG: Patient will demonstrate awareness during cognitive/linguistic activities with assistance of (SLP): Supervision Note: Downgraded to reflect ability and reduced participation
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
## 2019-09-30 NOTE — Progress Notes (Signed)
Physical Therapy Session Note  Patient Details  Name: Tonya Hunter MRN: [id number] Date of Birth: 1935-03-10  Today's Date: 09/30/2019 PT Individual Time: 1015-1110 and 1205-1220 PT Individual Time Calculation (min): 55 min and 15 min  Short Term Goals: Week 1:  PT Short Term Goal 1 (Week 1): Pt will perform supine<>sit with modA PT Short Term Goal 2 (Week 1): Pt will perform bed<>WC with slideboard and modA. PT Short Term Goal 3 (Week 1): Pt will perform sit to stand with maxA. PT Short Term Goal 4 (Week 1): Pt will perform 50' WC propulsion with minA.  Skilled Therapeutic Interventions/Progress Updates:     1st Session: Pt supine in bed. Attempts to refuse therapy, saying she has already done it for today. PT educates on importance of participating and receiving 3 hours of interdisciplinary therapy each day. Pt verbalizing pain in BLE R>L. RN aware and pain meds provided at end of session.   MaxA supine to sit. Pt sits at EOB requesting to return to supine but urged to continue with therapy. Elevated bed>WC transfer toward the L with BZ board and maxA. WC transport to therapy gym for energy conservation. WC>mat with BZ board and maxA, cues for anterior trunk lean and hand placement for safety.  Pt performs x3 reps of sit to stand from elevated mat with modA +2 and 3 musketeers technique for first rep, HHA for following 2 reps. Pt able to maintain TDWB precautions with PT positioning RLE anteriorly for propping. Pt maintains standing ~20-30 second each rep but crying and requesting to return to sitting each rep.  Pt transfer back to [facility] with BZ board and maxA. Left seated in WC with alarmt intact and all needs within reach.  2nd Session: Pt seated in [facility] and requesting to get back in bed. Pt had slid forward and buttocks was beginning to come out of WC. PT and nurse tech provide dependent assist to scoot pt's hips posteriorly. PT then provides totalA for transfer back to bed with BZ board.  MaxA for sit to supine and supine scoot to [facility] with dependent +2 assist. Pt performs rolling to L with minA/modA for pericare and linen change. Pt left with RN and tech present and all needs within reach.  Therapy Documentation Precautions:  Precautions Precautions: Fall Precaution Comments: Pt h/o confusion Required Braces or Orthoses: Other Brace Other Brace: AFO Restrictions Weight Bearing Restrictions: Yes RLE Weight Bearing: Partial weight bearing Other Position/Activity Restrictions: RLE   Therapy/Group: Individual Therapy  Breck Coons, PT, DPT 09/30/2019, 3:31 PM

## 2019-09-30 NOTE — Progress Notes (Signed)
Occupational Therapy Session Note  Patient Details  Name: Tonya Hunter MRN: [id number] Date of Birth: 04/26/1935  Today's Date: 09/30/2019 OT Individual Time: 0845-1000 OT Individual Time Calculation (min): 75 min    Short Term Goals: Week 1:  OT Short Term Goal 1 (Week 1): Patient will thread BLE into pants with Mod A and use of AE/DME as needed. OT Short Term Goal 2 (Week 1): Patient will complete SB transfer with 1 person assist. OT Short Term Goal 3 (Week 1): Patient will complete toilet transfer to [facility] with SB and 1 person assist with adherence to RLE weightbearing precautions. OT Short Term Goal 4 (Week 1): Patient will complete 1/3 parts of toileting with 1 person assist.  Skilled Therapeutic Interventions/Progress Updates:    Patient in bed, alert, perseverates on repositioning pillows.  She is oriented to month but confused about when events have occurred.  She states that she already did all of her therapy today although this is first scheduled session at 845 am.  She requires encouragement for all activity - can be resistive at times   Completed rolling in bed with mod A to left, moved to sitting edge of bed from elevated HOB with mod A.  She is able to maintain unsupported sitting for 10+ minutes with CS.  Returned to supine position with max A.  Completed LB AAROM with max A and encouragement.  She is able to pull herself forward with right rail with HOB elevated.  UB dressing complete mod A, oral care and face washing with set up.  B LEs on pillows, bed alarm set and call bell in hand at close of session.   Therapy Documentation Precautions:  Precautions Precautions: Fall Precaution Comments: Pt h/o confusion Required Braces or Orthoses: Other Brace Other Brace: AFO Restrictions Weight Bearing Restrictions: Yes RLE Weight Bearing: Touchdown weight bearing Other Position/Activity Restrictions: RLE General:   Vital Signs:  Pain: Pain Assessment Pain Scale: 0-10 Pain  Score: 5  Pain Location: Back Pain Orientation: Lower Pain Descriptors / Indicators: Discomfort Pain Intervention(s): Repositioned   Therapy/Group: Individual Therapy  Carlos Levering 09/30/2019, 12:20 PM

## 2019-10-01 ENCOUNTER — Inpatient Hospital Stay (HOSPITAL_COMMUNITY): Payer: BLUE CROSS/BLUE SHIELD | Admitting: Occupational Therapy

## 2019-10-01 ENCOUNTER — Inpatient Hospital Stay (HOSPITAL_COMMUNITY): Payer: BLUE CROSS/BLUE SHIELD

## 2019-10-01 LAB — PATHOLOGIST SMEAR REVIEW

## 2019-10-01 LAB — GLUCOSE, CAPILLARY
Glucose-Capillary: 159 mg/dL — ABNORMAL HIGH (ref 70–99)
Glucose-Capillary: 182 mg/dL — ABNORMAL HIGH (ref 70–99)
Glucose-Capillary: 174 mg/dL — ABNORMAL HIGH (ref 70–99)
Glucose-Capillary: 183 mg/dL — ABNORMAL HIGH (ref 70–99)

## 2019-10-01 MED ORDER — RIFAXIMIN 550 MG PO TABS
550.0000 mg | ORAL_TABLET | Freq: Two times a day (BID) | ORAL | Status: DC
  Administered 2019-10-01 – 2019-10-29 (×58): 550 mg via ORAL
  Filled 2019-10-01 (×57): qty 1

## 2019-10-01 MED ORDER — RIFAXIMIN 200 MG PO TABS: 200.0000 mg | ORAL_TABLET | Freq: Two times a day (BID) | ORAL | Status: DC

## 2019-10-01 NOTE — Progress Notes (Signed)
Patient ID: Tonya Hunter, female   DOB: March 27, 1935, 85 y.o.   MRN: [id number]  SW made efforts to explain to pt d/c date of 10/16/2019. Unsure of what pt understood, as she was asking for assistance with adjusting herself in bed. SW informed pt will call husband to inform.  5:02pm- SW left message for pt husband requesting return phone call to discuss updates from team conference. SW waiting on follow-up.   [name], MSW, [place] Office: [phone] Cell: [phone] Fax: [phone]

## 2019-10-01 NOTE — Progress Notes (Signed)
Occupational Therapy Session Note  Patient Details  Name: Tonya Hunter MRN: [id number] Date of Birth: [birth year]  Today's Date: 10/01/2019 OT Individual Time: 0732-0800 OT Individual Time Calculation (min): 28 min   Short Term Goals: Week 1:  OT Short Term Goal 1 (Week 1): Patient will thread BLE into pants with Mod A and use of AE/DME as needed. OT Short Term Goal 2 (Week 1): Patient will complete SB transfer with 1 person assist. OT Short Term Goal 3 (Week 1): Patient will complete toilet transfer to [facility] with SB and 1 person assist with adherence to RLE weightbearing precautions. OT Short Term Goal 4 (Week 1): Patient will complete 1/3 parts of toileting with 1 person assist.  Skilled Therapeutic Interventions/Progress Updates:    Pt greeted semi-reclined in bed awake and more agreeable to getting dressed today. Pt reported "I am not getting up to that wheelchair this morning," but was agreeable to bed level BADLs. Worked on bed mobility with rolling L and R with mod to L and max to the R. Pt was able to initiate roll, but could not follow through with hips. Pt was able to reach and was peri-area, with OT assist to wash buttocks in sidelying. New brief donned (though her old brief was not soiled). Worked on lifting LEs into pants, with pt having difficulty raising LEs up enough to thread pant legs. Rolling completed again in similar fashion, while OT assisted with pulling pants up over hips. Pt able to reach with L UE to help scoot up in bed with bed placed in trendelenburg. Pt left semi-reclined in bed with bed alarm on, call bell in reach, and needs met.   Therapy Documentation Precautions:  Precautions Precautions: Fall Precaution Comments: Pt h/o confusion Required Braces or Orthoses: Other Brace Other Brace: AFO Restrictions Weight Bearing Restrictions: Yes RLE Weight Bearing: Partial weight bearing Other Position/Activity Restrictions: RLE Pain:   No pain number given, pt  reported pain in B LEs   Therapy/Group: Individual Therapy  Valma Cava 10/01/2019, 8:00 AM

## 2019-10-01 NOTE — Progress Notes (Signed)
Physical Therapy Session Note  Patient Details  Name: Tonya Hunter MRN: [id number] Date of Birth: 1934-11-07  Today's Date: 10/01/2019 PT Individual Time: 1002-1059 PT Individual Time Calculation (min): 57 min   Short Term Goals: Week 1:  PT Short Term Goal 1 (Week 1): Pt will perform supine<>sit with modA PT Short Term Goal 2 (Week 1): Pt will perform bed<>WC with slideboard and modA. PT Short Term Goal 3 (Week 1): Pt will perform sit to stand with maxA. PT Short Term Goal 4 (Week 1): Pt will perform 50' WC propulsion with minA.  Skilled Therapeutic Interventions/Progress Updates:     Pt received supine in bed. Attempts to refuse therapy but is participatory with strong encouragement. Supine>sit with maxA and use of bed features. Pt able to maintain static sitting balance at EOB with close supervision for safety. Pt leans to the R with modA for PT to place slideboard and completes slideboard transfer to [facility] with maxA.  WC transport to therapy gym for energy conservation. Pt performs x3 reps of sit to stand in // bars with maxA and PT positioning RLE in propped position to prevent WB. Pt crying and saying she "cannot do it" throughout session, but is able to complete majority of mobility tasks with assistance from PT.   PT cues pt to attempt propulsion of WC back to room. Pt cannot functionally propel WC and does not put forth much effort to attempt propulsion. PT transports pt back to room. Slideboard transfer back to bed with maxA. Return to supine with modA/maxA and BLE management. Pt left supine in bed with alarm intact and all needs within reach.  Therapy Documentation Precautions:  Precautions Precautions: Fall Precaution Comments: Pt h/o confusion Required Braces or Orthoses: Other Brace Other Brace: AFO Restrictions Weight Bearing Restrictions: Yes RLE Weight Bearing: Partial weight bearing Other Position/Activity Restrictions: RLE    Therapy/Group: Individual  Therapy  Breck Coons, PT, DPT 10/01/2019, 3:29 PM

## 2019-10-01 NOTE — Progress Notes (Signed)
[facility] Progress Note  Subjective: The patient has no specific complaints today.  She has been started on a 2 g sodium diet.  She has been started on Lasix and is on Aldactone.  Objective: Vital signs in last 24 hours: Temp:  [97.5 F (36.4 C)-97.8 F (36.6 C)] 97.8 F (36.6 C) (05/18 0424) Pulse Rate:  [72-76] 75 (05/18 0424) Resp:  [18-19] 19 (05/18 0424) BP: (104-116)/(55-60) 104/55 (05/18 0424) SpO2:  [96 %-98 %] 97 % (05/18 0424) Weight:  [89.4 kg] 89.4 kg (05/18 0500) Weight change: 0.7 kg   PE:  Slightly confused  Abdomen with ascites  Lab Results: Results for orders placed or performed during the hospital encounter of 09/24/19 (from the past 24 hour(s))  Glucose, capillary     Status: Abnormal   Collection Time: 09/30/19 12:02 PM  Result Value Ref Range   Glucose-Capillary 131 (H) 70 - 99 mg/dL  Glucose, capillary     Status: Abnormal   Collection Time: 09/30/19  5:12 PM  Result Value Ref Range   Glucose-Capillary 169 (H) 70 - 99 mg/dL  Glucose, capillary     Status: Abnormal   Collection Time: 09/30/19  9:07 PM  Result Value Ref Range   Glucose-Capillary 161 (H) 70 - 99 mg/dL  Glucose, capillary     Status: Abnormal   Collection Time: 10/01/19  5:59 AM  Result Value Ref Range   Glucose-Capillary 159 (H) 70 - 99 mg/dL    Studies/Results: No results found.    Assessment: Cryptogenic cirrhosis of the liver  Ascites  Hepatic encephalopathy  Plan:   Continue current management.  I think it would be reasonable to begin Xifaxan 550 mg p.o. twice daily.    Cassell Clement 10/01/2019, 10:34 AM  Pager: [phone] If no answer or after 5 PM call [phone]

## 2019-10-01 NOTE — Progress Notes (Signed)
Pt has new skin tear on left lower extrem., per nurse it occurred during a therapy session. It has a large amount of serosanguineous drainage. Pt complains of level 8/10 pai in that area. This nurse applied a mepiplex to skin tear.

## 2019-10-01 NOTE — Progress Notes (Signed)
Speech Language Pathology Daily Session Note  Patient Details  Name: Tonya Hunter MRN: [id number] Date of Birth: 1934-08-06  Today's Date: 10/01/2019 SLP Individual Time: 0902-0930 SLP Individual Time Calculation (min): 28 min  Short Term Goals: Week 2: SLP Short Term Goal 1 (Week 2): Pt will demonstrate sustained attention during functional tasks in 10 minute intervals with min A verbal cues for redirection. SLP Short Term Goal 2 (Week 2): Pt will demonstrate recall of novel, daily information with use of visual aids given min A verbal cues. SLP Short Term Goal 3 (Week 2): Pt will complete basic functional problem solving tasks with min A verbal cues. SLP Short Term Goal 4 (Week 2): Pt will list 2 cognitive deficits and thier impact on function with min A verbal cues.  Skilled Therapeutic Interventions: Skilled ST services focused on cognitive skills. Pt was unable to recall ST events from yesterday's session, but recognized cards when presented. SLP facilitated basic problem solving and sustained attention skills in familiar card sorting task by 6 colors, pt required mod A verbal cues for problem solving and max A verbal cues for sustained attention. Pt was in denial of reduced sustained attention deficits, but agreed to problem solving difficulty. Pt was left in room with call bell within reach and bed alarm set. ST recommends to continue skilled ST services.      Pain Pain Assessment Pain Score: 0-No pain  Therapy/Group: Individual Therapy  Renesmae Donahey  [facility] 10/01/2019, 11:38 AM

## 2019-10-01 NOTE — Progress Notes (Signed)
Occupational Therapy Session Note  Patient Details  Name: Tonya Hunter MRN: [id number] Date of Birth: 01-11-1935  Today's Date: 10/01/2019 OT Individual Time: 1300-1415 OT Individual Time Calculation (min): 75 min    Short Term Goals: Week 1:  OT Short Term Goal 1 (Week 1): Patient will thread BLE into pants with Mod A and use of AE/DME as needed. OT Short Term Goal 2 (Week 1): Patient will complete SB transfer with 1 person assist. OT Short Term Goal 3 (Week 1): Patient will complete toilet transfer to [facility] with SB and 1 person assist with adherence to RLE weightbearing precautions. OT Short Term Goal 4 (Week 1): Patient will complete 1/3 parts of toileting with 1 person assist.  Skilled Therapeutic Interventions/Progress Updates:  Patient in bed at time of session resting in supine, HOB slightly elevated. Decreased willingness to participate in OT session initially, but became more agreeable to participate in OT session with encouragement. Patient participated in bed mobility activity to improve ability to transition from supine to sitting up at EOB with Mod A, assistance required to manage BLEs (R>L d/t pain) to move to EOB, instructions and physical assist to roll onto side and utilize bed rails to assist the patient in coming up to an upright sitting position. Patient's verbal expression did not make sense this date, perseverating on bed position, pillows, and stating "just sit me up" multiple times. Once sitting up at EOB, patient declined rotating her hips to sit with feet on the floor but did participate in static seated activity for 10-15 minutes without LOB to improve core strength. While seated pt participated in grooming/oral hygiene tasks with unilateral support of LUE while using RUE for tasks, SUP for thoroughness. Pt returned to supine, bed mobility for rolling L and R multiple trials with cues to use bed rails to assist, rolling with Min-Mod A with periods of side lying to provide  pressure relief and decrease risk of skin breakdown. Bed level sit ups with use of bed rails multiple trials to improve core strength and trunk stability. Patient positioned at bed level scooted up towards [facility] with BLEs elevated with pillows to float heals and decrease risk of skin breakdown, call bell within reach and all needs met. Pt noted to have decreased cog/increased confusion throughout session.      Therapy Documentation Precautions:  Precautions Precautions: Fall Precaution Comments: Pt h/o confusion Required Braces or Orthoses: Other Brace Other Brace: AFO Restrictions Weight Bearing Restrictions: Yes RLE Weight Bearing: Partial weight bearing Other Position/Activity Restrictions: RLE   Therapy/Group: Individual Therapy  Viona Gilmore 10/01/2019, 2:53 PM
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
## 2019-10-02 ENCOUNTER — Inpatient Hospital Stay (HOSPITAL_COMMUNITY): Payer: BLUE CROSS/BLUE SHIELD | Admitting: Physical Therapy

## 2019-10-02 ENCOUNTER — Inpatient Hospital Stay (HOSPITAL_COMMUNITY): Payer: BLUE CROSS/BLUE SHIELD | Admitting: Occupational Therapy

## 2019-10-02 ENCOUNTER — Inpatient Hospital Stay (HOSPITAL_COMMUNITY): Payer: BLUE CROSS/BLUE SHIELD

## 2019-10-02 LAB — GLUCOSE, CAPILLARY
Glucose-Capillary: 147 mg/dL — ABNORMAL HIGH (ref 70–99)
Glucose-Capillary: 190 mg/dL — ABNORMAL HIGH (ref 70–99)
Glucose-Capillary: 252 mg/dL — ABNORMAL HIGH (ref 70–99)
Glucose-Capillary: 252 mg/dL — ABNORMAL HIGH (ref 70–99)

## 2019-10-02 LAB — CULTURE, BODY FLUID W GRAM STAIN -BOTTLE: Culture: NO GROWTH

## 2019-10-02 MED ORDER — SORBITOL 70 % SOLN
30.0000 mL | Freq: Every day | Status: DC | PRN
  Filled 2019-10-02: qty 30

## 2019-10-02 MED ORDER — LACTULOSE 10 GM/15ML PO SOLN
30.0000 g | Freq: Two times a day (BID) | ORAL | Status: DC
  Administered 2019-10-02 – 2019-10-09 (×14): 30 g via ORAL
  Filled 2019-10-02 (×16): qty 45
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
## 2019-10-02 NOTE — Progress Notes (Addendum)
Physical Therapy Session Note  Patient Details  Name: Tonya Hunter MRN: [id number] Date of Birth: December 19, 1934  Today's Date: 10/02/2019 PT Individual Time:  -    Missed time 67'  Short Term Goals: Week 1:  PT Short Term Goal 1 (Week 1): Pt will perform supine<>sit with modA PT Short Term Goal 2 (Week 1): Pt will perform bed<>WC with slideboard and modA. PT Short Term Goal 3 (Week 1): Pt will perform sit to stand with maxA. PT Short Term Goal 4 (Week 1): Pt will perform 50' WC propulsion with minA.  Skilled Therapeutic Interventions/Progress Updates: Attempted x 3 to encourage to participate w/ therapy, but refusing.  Educated on need for PT services to return to home, but adamantly refusing.  Encouraged to sit up and eat breakfast in chiar, but refused.  Missed time 60 min.     Therapy Documentation Precautions:  Precautions Precautions: Fall Precaution Comments: Pt h/o confusion Required Braces or Orthoses: Other Brace Other Brace: AFO Restrictions Weight Bearing Restrictions: Yes RLE Weight Bearing: Partial weight bearing Other Position/Activity Restrictions: RLE General: PT Amount of Missed Time (min): 60 Minutes PT Missed Treatment Reason: Other (Comment)(attempted x 3, pt refusing to participate.) Vital Signs:   Pain:  pt states pain, but not quantifying.    Therapy/Group: Individual Therapy  Ladoris Gene 10/02/2019, 9:48 AM

## 2019-10-02 NOTE — Progress Notes (Signed)
Speech Language Pathology Daily Session Note  Patient Details  Name: Tonya Hunter MRN: [id number] Date of Birth: 04-26-1935  Today's Date: 10/02/2019 SLP Individual Time: 8099-8338 SLP Individual Time Calculation (min): 28 min  Short Term Goals: Week 2: SLP Short Term Goal 1 (Week 2): Pt will demonstrate sustained attention during functional tasks in 10 minute intervals with min A verbal cues for redirection. SLP Short Term Goal 2 (Week 2): Pt will demonstrate recall of novel, daily information with use of visual aids given min A verbal cues. SLP Short Term Goal 3 (Week 2): Pt will complete basic functional problem solving tasks with min A verbal cues. SLP Short Term Goal 4 (Week 2): Pt will list 2 cognitive deficits and thier impact on function with min A verbal cues.  Skilled Therapeutic Interventions: Skilled ST services focused on cognitive skills. Pt's husband was present for treatment session. SLP facilitated recall of yesterday's ST events pt demonstrated recall with mod A verbal cues. SLP also facilitated basic problem solving and sustained attention in pattern creation task with block, pt demonstrated ability to recreate basic to mildly complex alternating patterns with min A verbal cues for problem solving and max A verbal cues to correct errors. Pt was able to recall her age (today was her birthday) with max A visual aid cues. Pt was left in room with call bell within reach and bed alarm set. ST recommends to continue skilled ST services.      Pain Pain Assessment Pain Score: 0-No pain  Therapy/Group: Individual Therapy  Ildefonso Keaney  [facility] 10/02/2019, 4:36 PM

## 2019-10-02 NOTE — Progress Notes (Signed)
[facility] Progress Note  Subjective: No specific complaints voiced today.  Objective: Vital signs in last 24 hours: Temp:  [97.7 F (36.5 C)-98.6 F (37 C)] 98.6 F (37 C) (05/19 0419) Pulse Rate:  [69-85] 85 (05/19 0419) Resp:  [12-18] 16 (05/19 0419) BP: (102-126)/(51-62) 126/62 (05/19 0419) SpO2:  [97 %-99 %] 99 % (05/19 0419) Weight:  [89.2 kg] 89.2 kg (05/19 0419) Weight change: -0.2 kg   PE:  No distress  Seems to have clearer mentation  Abdomen with ascites but not painful  Lab Results: Results for orders placed or performed during the hospital encounter of 09/24/19 (from the past 24 hour(s))  Glucose, capillary     Status: Abnormal   Collection Time: 10/01/19 11:50 AM  Result Value Ref Range   Glucose-Capillary 182 (H) 70 - 99 mg/dL  Glucose, capillary     Status: Abnormal   Collection Time: 10/01/19  4:57 PM  Result Value Ref Range   Glucose-Capillary 174 (H) 70 - 99 mg/dL  Glucose, capillary     Status: Abnormal   Collection Time: 10/01/19  9:35 PM  Result Value Ref Range   Glucose-Capillary 183 (H) 70 - 99 mg/dL  Glucose, capillary     Status: Abnormal   Collection Time: 10/02/19  6:16 AM  Result Value Ref Range   Glucose-Capillary 147 (H) 70 - 99 mg/dL    Studies/Results: No results found.    Assessment: Cryptogenic cirrhosis of liver  Ascites  Hepatic encephalopathy  Plan:   Clinically stable at this time.  I would recommend continuing her current diuretic therapy, lactulose and Xifaxan, and 2 g sodium diet.  Keep an eye on potential progression of ascites and if necessary she may need another paracentesis.  For now we will sign off but call us if you have questions.  When she gets out of the hospital she will need to follow-up with her primary gastroenterologist Dr. Cristina Gong.    SAM F Vianey Caniglia 10/02/2019, 10:26 AM  Pager: [phone] If no answer or after 5 PM call [phone]

## 2019-10-02 NOTE — Progress Notes (Signed)
Physical Therapy Session Note  Patient Details  Name: Tonya Hunter MRN: [id number] Date of Birth: [birth year]  Today's Date: 10/02/2019 PT Individual Time: 1415-1445 PT Individual Time Calculation (min): 30 min   Short Term Goals: Week 1:  PT Short Term Goal 1 (Week 1): Pt will perform supine<>sit with modA PT Short Term Goal 2 (Week 1): Pt will perform bed<>WC with slideboard and modA. PT Short Term Goal 3 (Week 1): Pt will perform sit to stand with maxA. PT Short Term Goal 4 (Week 1): Pt will perform 50' WC propulsion with minA.  Skilled Therapeutic Interventions/Progress Updates:   Pt received sitting in WC and agreeable to PT. SB transfer to bed wit mod assist overall while maintaining adequate adherence to WB precautions.inthe RLE. Max encouragement for participation, and pt stating that she can not do it, while actively transferring herself to bed. Sit>supine with min assist to control RLE only, pt became tearful once in bed, but reports no increase in pain.       Therapy Documentation Precautions:  Precautions Precautions: Fall Precaution Comments: Pt h/o confusion Required Braces or Orthoses: Other Brace Other Brace: AFO Restrictions Weight Bearing Restrictions: Yes RLE Weight Bearing: Partial weight bearing Other Position/Activity Restrictions: RLE Pain: Faces: ( hurts a little)    Therapy/Group: Individual Therapy  Lorie Phenix 10/02/2019, 2:49 PM

## 2019-10-02 NOTE — Plan of Care (Signed)
  Problem: Consults Goal: RH GENERAL PATIENT EDUCATION Description: See Patient Education module for education specifics. Outcome: Progressing Goal: Skin Care Protocol Initiated - if Braden Score 18 or less Description: If consults are not indicated, leave blank or document N/A Outcome: Progressing Goal: Nutrition Consult-if indicated Outcome: Progressing Goal: Diabetes Guidelines if Diabetic/Glucose > 140 Description: If diabetic or lab glucose is > 140 mg/dl - Initiate Diabetes/Hyperglycemia Guidelines & Document Interventions  Outcome: Progressing   Problem: RH BOWEL ELIMINATION Goal: RH STG MANAGE BOWEL WITH ASSISTANCE Description: STG Manage Bowel with Assistance. Outcome: Progressing Goal: RH STG MANAGE BOWEL W/MEDICATION W/ASSISTANCE Description: STG Manage Bowel with Medication with Assistance. Outcome: Progressing   Problem: RH BLADDER ELIMINATION Goal: RH STG MANAGE BLADDER WITH ASSISTANCE Description: STG Manage Bladder With Assistance Outcome: Progressing Goal: RH STG MANAGE BLADDER WITH MEDICATION WITH ASSISTANCE Description: STG Manage Bladder With Medication With Assistance. Outcome: Progressing Goal: RH STG MANAGE BLADDER WITH EQUIPMENT WITH ASSISTANCE Description: STG Manage Bladder With Equipment With Assistance Outcome: Progressing   Problem: RH SKIN INTEGRITY Goal: RH STG SKIN FREE OF INFECTION/BREAKDOWN Outcome: Progressing Goal: RH STG MAINTAIN SKIN INTEGRITY WITH ASSISTANCE Description: STG Maintain Skin Integrity With Assistance. Outcome: Progressing Goal: RH STG ABLE TO PERFORM INCISION/WOUND CARE W/ASSISTANCE Description: STG Able To Perform Incision/Wound Care With Assistance. Outcome: Progressing   Problem: RH SAFETY Goal: RH STG ADHERE TO SAFETY PRECAUTIONS W/ASSISTANCE/DEVICE Description: STG Adhere to Safety Precautions With Assistance/Device. Outcome: Progressing Goal: RH STG DECREASED RISK OF FALL WITH ASSISTANCE Description: STG  Decreased Risk of Fall With Assistance. Outcome: Progressing   Problem: RH PAIN MANAGEMENT Goal: RH STG PAIN MANAGED AT OR BELOW PT'S PAIN GOAL Outcome: Progressing   Problem: RH KNOWLEDGE DEFICIT GENERAL Goal: RH STG INCREASE KNOWLEDGE OF SELF CARE AFTER HOSPITALIZATION Outcome: Progressing

## 2019-10-02 NOTE — Progress Notes (Signed)
Occupational Therapy Session Note  Patient Details  Name: Tonya Hunter MRN: [id number] Date of Birth: Oct 07, 1934  Today's Date: 10/02/2019 OT Individual Time: 1300-1400 OT Individual Time Calculation (min): 60 min    Short Term Goals: Week 1:  OT Short Term Goal 1 (Week 1): Patient will thread BLE into pants with Mod A and use of AE/DME as needed. OT Short Term Goal 2 (Week 1): Patient will complete SB transfer with 1 person assist. OT Short Term Goal 3 (Week 1): Patient will complete toilet transfer to [facility] with SB and 1 person assist with adherence to RLE weightbearing precautions. OT Short Term Goal 4 (Week 1): Patient will complete 1/3 parts of toileting with 1 person assist.  Skilled Therapeutic Interventions/Progress Updates:  Patient met lying supine in bed. Max coaxing/encouragement for participation with skilled OT treatment session. LB dressing in supine with patient requiring assistance to thread BLE. AE unavailable this date. Patient required Mod-Max A for rolling L<>R to hike pants over hips. Supine to EOB with Max A and HOB elevated. Patient required assistance to advance BLE toward EOB. Patient initiated bringing trunk from supine to upright with use of bedrail and therapists arm but required Max A to complete. After extended coaxing/encouragement patient completed SB transfer to wc on R with Max A and cueing for sequencing, head/hip relationship and hand placement. With probing, patient reports that she enjoys listening to classical music. Music played throughout duration of treatment session. Hair washing seated at sink level with total A, however patient able to comb hair and style to her liking. Session concluded with patient seated in wc with call bell within reach, BLE elevated for edema management, and belt alarm activated.   Therapy Documentation Precautions:  Precautions Precautions: Fall Precaution Comments: Pt h/o confusion Required Braces or Orthoses: Other  Brace Other Brace: AFO Restrictions Weight Bearing Restrictions: Yes RLE Weight Bearing: Partial weight bearing Other Position/Activity Restrictions: RLE General: General PT Missed Treatment Reason: Other (Comment)(attempted x 3, pt refusing to participate.)  Therapy/Group: Individual Therapy  [name] 10/02/2019, 1:02 PM

## 2019-10-03 ENCOUNTER — Inpatient Hospital Stay (HOSPITAL_COMMUNITY): Payer: BLUE CROSS/BLUE SHIELD | Admitting: Speech Pathology

## 2019-10-03 ENCOUNTER — Inpatient Hospital Stay (HOSPITAL_COMMUNITY): Payer: BLUE CROSS/BLUE SHIELD | Admitting: Occupational Therapy

## 2019-10-03 ENCOUNTER — Inpatient Hospital Stay (HOSPITAL_COMMUNITY): Payer: BLUE CROSS/BLUE SHIELD | Admitting: Physical Therapy

## 2019-10-03 LAB — COMPREHENSIVE METABOLIC PANEL
ALT: 32 U/L (ref 0–44)
AST: 45 U/L — ABNORMAL HIGH (ref 15–41)
Albumin: 2.2 g/dL — ABNORMAL LOW (ref 3.5–5.0)
Alkaline Phosphatase: 329 U/L — ABNORMAL HIGH (ref 38–126)
Anion gap: 8 (ref 5–15)
BUN: 26 mg/dL — ABNORMAL HIGH (ref 8–23)
CO2: 20 mmol/L — ABNORMAL LOW (ref 22–32)
Calcium: 8.3 mg/dL — ABNORMAL LOW (ref 8.9–10.3)
Chloride: 100 mmol/L (ref 98–111)
Creatinine, Ser: 0.95 mg/dL (ref 0.44–1.00)
GFR calc Af Amer: >60 mL/min (ref >60)
GFR calc non Af Amer: 55 mL/min — ABNORMAL LOW (ref >60)
Glucose, Bld: 235 mg/dL — ABNORMAL HIGH (ref 70–99)
Potassium: 5.1 mmol/L (ref 3.5–5.1)
Sodium: 128 mmol/L — ABNORMAL LOW (ref 135–145)
Total Bilirubin: 1.8 mg/dL — ABNORMAL HIGH (ref 0.3–1.2)
Total Protein: 5.6 g/dL — ABNORMAL LOW (ref 6.5–8.1)

## 2019-10-03 LAB — CBC WITH DIFFERENTIAL/PLATELET
Abs Immature Granulocytes: 0.02 10*3/uL (ref 0.00–0.07)
Basophils Absolute: 0 10*3/uL (ref 0.0–0.1)
Basophils Relative: 1 %
Eosinophils Absolute: 0.2 10*3/uL (ref 0.0–0.5)
Eosinophils Relative: 5 %
HCT: 31.1 % — ABNORMAL LOW (ref 36.0–46.0)
Hemoglobin: 10 g/dL — ABNORMAL LOW (ref 12.0–15.0)
Immature Granulocytes: 1 %
Lymphocytes Relative: 19 %
Lymphs Abs: 0.7 10*3/uL (ref 0.7–4.0)
MCH: 32.2 pg (ref 26.0–34.0)
MCHC: 32.2 g/dL (ref 30.0–36.0)
MCV: 100 fL (ref 80.0–100.0)
Monocytes Absolute: 0.4 10*3/uL (ref 0.1–1.0)
Monocytes Relative: 12 %
Neutro Abs: 2.2 10*3/uL (ref 1.7–7.7)
Neutrophils Relative %: 62 %
Platelets: 120 10*3/uL — ABNORMAL LOW (ref 150–400)
RBC: 3.11 MIL/uL — ABNORMAL LOW (ref 3.87–5.11)
RDW: 20.5 % — ABNORMAL HIGH (ref 11.5–15.5)
WBC: 3.5 10*3/uL — ABNORMAL LOW (ref 4.0–10.5)
nRBC: 0 % (ref 0.0–0.2)

## 2019-10-03 LAB — PROTIME-INR
INR: 1.4 — ABNORMAL HIGH (ref 0.8–1.2)
Prothrombin Time: 16.3 seconds — ABNORMAL HIGH (ref 11.4–15.2)

## 2019-10-03 LAB — GLUCOSE, CAPILLARY
Glucose-Capillary: 179 mg/dL — ABNORMAL HIGH (ref 70–99)
Glucose-Capillary: 204 mg/dL — ABNORMAL HIGH (ref 70–99)
Glucose-Capillary: 144 mg/dL — ABNORMAL HIGH (ref 70–99)

## 2019-10-03 LAB — AMMONIA: Ammonia: 40 umol/L — ABNORMAL HIGH (ref 9–35)

## 2019-10-03 MED ORDER — COLLAGENASE 250 UNIT/GM EX OINT
TOPICAL_OINTMENT | Freq: Every day | CUTANEOUS | Status: DC
  Filled 2019-10-03: qty 30

## 2019-10-03 MED ORDER — ASCORBIC ACID 500 MG PO TABS
500.0000 mg | ORAL_TABLET | Freq: Two times a day (BID) | ORAL | Status: DC
  Administered 2019-10-03 – 2019-10-29 (×53): 500 mg via ORAL
  Filled 2019-10-03 (×53): qty 1

## 2019-10-03 MED ORDER — ZINC SULFATE 220 (50 ZN) MG PO CAPS
220.0000 mg | ORAL_CAPSULE | Freq: Every day | ORAL | Status: DC
  Administered 2019-10-03 – 2019-10-29 (×27): 220 mg via ORAL
  Filled 2019-10-03 (×27): qty 1
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
## 2019-10-03 NOTE — Progress Notes (Signed)
Physical Therapy Session Note  Patient Details  Name: Tonya Hunter MRN: [id number] Date of Birth: Aug 01, 1934  Today's Date: 10/03/2019 PT Individual Time: 4098-1191 PT Individual Time Calculation (min): 53 min   Short Term Goals: Week 1:  PT Short Term Goal 1 (Week 1): Pt will perform supine<>sit with modA PT Short Term Goal 2 (Week 1): Pt will perform bed<>WC with slideboard and modA. PT Short Term Goal 3 (Week 1): Pt will perform sit to stand with maxA. PT Short Term Goal 4 (Week 1): Pt will perform 50' WC propulsion with minA.  Skilled Therapeutic Interventions/Progress Updates:   Pt received supine in bed and agreeable to PT, but stating that she cannot move at all, while pulling herself into long sitting with use of bed features. Supine>sit transfer with mod assist to initiate movement in BLE on the R side of bed through semi-long sitting position. Max encouragement from PT. Pt's chuck pad noted to be soiled. Sitting balance EOB x 5 minutes. With intermittent 1-2 UE support and supervision assist. PT assessed brief and noted significant, stageable wound on sacrum, undressed. PT notified RN. Lateral scoot to [facility] with mod-max assist to the R. Sit>supine with mod assist for BLE management. Noted blood on PT glove from unknown origin. Once Supine, pt performed roll to the L with mod assist and max encouragement. Brief doffed and RN present for assessment of wound.  PA then requested to assess as well for proper dressing as wound had expanded, see chart for PA note. Recommendation also made for use of air mattress for wound management, as pt is very resistant to out of bed mobility, allowing adequate pressure relief. PT aided pt in positioning in sidelying while PA performed assessment and dressing applied by RN. Rolling into supine and to the R with min assist to don clean brief. PT noted skin tear in multiple locations on the lateral aspect of the L lower leg/knee. RN made aware and asked to  apply appropriate dressing as son as possible. Pt performed several supine>long sit transitions, asking PT to adjust pillows, but unable to find comfortable position. PT left pt in semireclined position with pillow under R hip to improve pressure relief on R ischial tub and sacrum.       Therapy Documentation Precautions:  Precautions Precautions: Fall Precaution Comments: Pt h/o confusion Required Braces or Orthoses: Other Brace Other Brace: AFO Restrictions Weight Bearing Restrictions: Yes RLE Weight Bearing: Partial weight bearing Other Position/Activity Restrictions: RLE Vital Signs: Therapy Vitals Temp: 98.5 F (36.9 C) Pulse Rate: 85 Resp: 17 BP: 107/60 Patient Position (if appropriate): Lying Oxygen Therapy SpO2: 97 % O2 Device: Room Air Pain: Pain Assessment Pain Scale: 0-10 Pain Score: 0-No pain    Therapy/Group: Individual Therapy  Lorie Phenix 10/03/2019, 5:39 PM

## 2019-10-03 NOTE — Progress Notes (Signed)
Patient ID: Tonya Hunter, female   DOB: 23-Apr-1935, 85 y.o.   MRN: [id number]    SW left message for pt husband [name] [phone]) requesting return phone call to discuss updates from team conference. SW waiting on follow-up.   [name], MSW, [place] Office: [phone] Cell: [phone] Fax: [phone]

## 2019-10-03 NOTE — Progress Notes (Signed)
Occupational Therapy Session Note  Patient Details  Name: Tonya Hunter MRN: [id number] Date of Birth: [birth year]  Today's Date: 10/03/2019 OT Individual Time: 2549-8264 OT Individual Time Calculation (min): 42 min   Short Term Goals: Week 1:  OT Short Term Goal 1 (Week 1): Patient will thread BLE into pants with Mod A and use of AE/DME as needed. OT Short Term Goal 2 (Week 1): Patient will complete SB transfer with 1 person assist. OT Short Term Goal 3 (Week 1): Patient will complete toilet transfer to [facility] with SB and 1 person assist with adherence to RLE weightbearing precautions. OT Short Term Goal 4 (Week 1): Patient will complete 1/3 parts of toileting with 1 person assist.  Skilled Therapeutic Interventions/Progress Updates:    Pt greeted semi-reclined in bed with eyes closed. OT able to wake pt. Pt trying to refuse therapy, stating, " I will do it this afternoon." OT informed pt that it was after 2:00 in the afternoon and she had been able to rest all morning. Pt said "after lunch then" which OT informed pt she had already eaten lunch. With max encouragement, OT eventually able to get pt to sit EOB. Pt needed max A for LE management, but was able to elevate trunk with min A. Pt tearful and crying throughout bed mobility and while sitting EOB. OT asked pt if she was scared or felt like she was falling, but pt stated " I am just nervous." OT re-assured pt, but she remained fearful. PT needed total A for lateral scoot along EOB towards HOB and total A to return to supine. OT assisted nursing with LE positioning for straight cath procedure. OT then had pt come into long sitting in bed with pt able to elevate trunk using B rails with min A. Pt able to maintain long sitting without UE support to assist with doffing old shirt and donning gown from home. Rolling in supine with mod A to change chuck pads 2/2 being soiled from weeping LEs. Pt left semi-reclined in bed with bed alarm on, call bell in  reach, and needs met.   Therapy Documentation Precautions:  Precautions Precautions: Fall Precaution Comments: Pt h/o confusion Required Braces or Orthoses: Other Brace Other Brace: AFO Restrictions Weight Bearing Restrictions: Yes RLE Weight Bearing: Partial weight bearing Other Position/Activity Restrictions: RLE  General: Pt missed 18 minutes of OT treatment session 2/2 pain and fatigue  Pain:   Pt would not state pain number but reported pain in B LEs  Therapy/Group: Individual Therapy  Valma Cava 10/03/2019, 2:39 PM

## 2019-10-03 NOTE — Progress Notes (Signed)
Speech Language Pathology Daily Session Note  Patient Details  Name: Tonya Hunter MRN: [id number] Date of Birth: [birth year]  Today's Date: 10/03/2019 SLP Individual Time: 6147-0929 SLP Individual Time Calculation (min): 28 min  Short Term Goals: Week 2: SLP Short Term Goal 1 (Week 2): Pt will demonstrate sustained attention during functional tasks in 10 minute intervals with min A verbal cues for redirection. SLP Short Term Goal 2 (Week 2): Pt will demonstrate recall of novel, daily information with use of visual aids given min A verbal cues. SLP Short Term Goal 3 (Week 2): Pt will complete basic functional problem solving tasks with min A verbal cues. SLP Short Term Goal 4 (Week 2): Pt will list 2 cognitive deficits and thier impact on function with min A verbal cues.  Skilled Therapeutic Interventions: Pt was seen for skilled ST targeting cognitive goals. SLP utilized conversation to target recall and intellectual awareness. Pt unable to identify any physical or cognitive impairments without multiple choice cues. Pt verbally recalled side board as transfer method, however Min A verbal cues required for pt to verbally sequence steps of a transfer with this method, and Max A to recall any related safety precautions. Pt left sitting in bed with alarm set and needs within reach. Continue per current plan of care.        Pain Pain Assessment Pain Scale: 0-10 Pain Score: 0-No pain  Therapy/Group: Individual Therapy  Arbutus Leas 10/03/2019, 7:21 AM

## 2019-10-03 NOTE — Progress Notes (Signed)
Sacal decub now has adherent superficial yellow eschar. Will add santyl with damp to dry dressing for enzymatic debridement. Cover with foam dressing. Will order air mattress overlay.

## 2019-10-04 ENCOUNTER — Inpatient Hospital Stay (HOSPITAL_COMMUNITY): Payer: BLUE CROSS/BLUE SHIELD | Admitting: Occupational Therapy

## 2019-10-04 ENCOUNTER — Inpatient Hospital Stay (HOSPITAL_COMMUNITY): Payer: BLUE CROSS/BLUE SHIELD | Admitting: Physical Therapy

## 2019-10-04 ENCOUNTER — Inpatient Hospital Stay (HOSPITAL_COMMUNITY): Payer: BLUE CROSS/BLUE SHIELD | Admitting: Speech Pathology

## 2019-10-04 LAB — GLUCOSE, CAPILLARY
Glucose-Capillary: 149 mg/dL — ABNORMAL HIGH (ref 70–99)
Glucose-Capillary: 145 mg/dL — ABNORMAL HIGH (ref 70–99)
Glucose-Capillary: 114 mg/dL — ABNORMAL HIGH (ref 70–99)
Glucose-Capillary: 196 mg/dL — ABNORMAL HIGH (ref 70–99)

## 2019-10-04 MED ORDER — POLYETHYLENE GLYCOL 3350 17 G PO PACK
17.0000 g | PACK | Freq: Every day | ORAL | Status: DC
  Administered 2019-10-04 – 2019-10-28 (×22): 17 g via ORAL
  Filled 2019-10-04 (×22): qty 1

## 2019-10-04 NOTE — Progress Notes (Signed)
Occupational Therapy Weekly Progress Note  Patient Details  Name: Tonya Hunter MRN: [id number] Date of Birth: Apr 02, 1935  Beginning of progress report period: Sep 25, 2019 End of progress report period: Oct 04, 2019  Today's Date: 10/04/2019 OT Individual Time: 0805-0900 OT Individual Time Calculation (min): 55 min    Patient has met 1 of 4 short term goals. Patient able to complete LB dressing in supine with assistance for threading BLE and hiking over hips in side-lying without use of AE secondary to decreased AROM in RUE (internal rotation/ shoulder extension/shoulder abduction), BLE weakness and weeping edema. Patient able to advance pants from knees to just below hips/buttocks. SB transfers have progressed to Max A from +2 helpers with patient requiring increased time and cues for adherence to RLE TDWB precautions. Patient still incontinent of bowel and bladder. Limited participation with therapy services has prevented patient from engaging in toilet tranfers to [facility]. At bed level, patient able to complete front perineal hygiene but requires Max A to wash buttocks in side-lying. Patient currently requires maximal coaxing/encouragement for participation with OT treatment sessions at bed level, EOB, and OOB. Patient often emotionally labile requiring emotional support.   Patient continues to demonstrate the following deficits: muscle weakness,decreased cardiorespiratoy endurance,decreased initiation, decreased attention, decreased awareness, decreased memory and delayed processingand decreased sitting balance, decreased standing balance, decreased postural control, decreased balance strategies and difficulty maintaining precautions and therefore will continue to benefit from skilled OT intervention to enhance overall performance with BADL and Reduce care partner burden.  Patient not progressing toward long term goals.  See goal revision..  Plan of care revisions: BADLs with Mod A  overall..  OT Short Term Goals Week 1:  OT Short Term Goal 1 (Week 1): Patient will thread BLE into pants with Mod A and use of AE/DME as needed. OT Short Term Goal 1 - Progress (Week 1): Progressing toward goal OT Short Term Goal 2 (Week 1): Patient will complete SB transfer with 1 person assist. OT Short Term Goal 2 - Progress (Week 1): Met OT Short Term Goal 3 (Week 1): Patient will complete toilet transfer to [facility] with SB and 1 person assist with adherence to RLE weightbearing precautions. OT Short Term Goal 3 - Progress (Week 1): Progressing toward goal OT Short Term Goal 4 (Week 1): Patient will complete 1/3 parts of toileting with 1 person assist. OT Short Term Goal 4 - Progress (Week 1): Progressing toward goal Week 2:  OT Short Term Goal 1 (Week 2): Patient will thread BLE into pants with Mod A and use of AE/DME as needed. OT Short Term Goal 2 (Week 2): Patient will complete SB transfer with Mod A. OT Short Term Goal 3 (Week 2): Patient will complete toilet transfer to [facility] with SB and 1 person assist with adherence to RLE weightbearing precautions. OT Short Term Goal 4 (Week 2): Patient will complete 1/3 parts of toileting with 1 person assist.   Skilled Therapeutic Interventions/Progress Updates:  Patient met lying supine in bed in agreement with OT treatment session with focus on self-care re-education and bed mobility in prep for bed level BADLs as detailed below. RN present to administer meds for first 10 minutes of OT treatment session. Family education provided to patients husband on patients CLOF. Husband expressed verbal understanding. No additional questions. Recommend additional education for husband on patient's CLOF and needs. OT asked patients husband if he could bring in comfortable clothing for patient as she's been wearing hospital scrubs. Husband notes  patients clothing most likely would not fit at this time. Will continue to dress in disposable scrubs. Multiple episodes of  retching and emesis throughout OT treatment session. RN aware. Patient engaged in bathing/dressing at bed level secondary to nausea/vomiting, decreased motivation, and increased need for coaxing/encouragement for participation with skilled OT services. Patient able to roll R<>L x multiple tirals with Max A to R and Mod A to L with ability to maintain side-lying position with use of bed rails while OT washed buttocks. Please refer to caretool for levels of assistance. Session completed with patient lying supine in bed with HOB elevated, call bell within reach and bed alarm activated and all needs met.   Therapy Documentation Precautions:  Precautions Precautions: Fall Precaution Comments: Pt h/o confusion Required Braces or Orthoses: Other Brace Other Brace: AFO Restrictions Weight Bearing Restrictions: Yes RLE Weight Bearing: Partial weight bearing Other Position/Activity Restrictions: RLE  Therapy/Group: Individual Therapy  Tonya Hunter 10/04/2019, 7:48 AM

## 2019-10-04 NOTE — Progress Notes (Signed)
Sutures removed today from RLE. Patient tolerated well, edges well approximated, no bleeding.

## 2019-10-04 NOTE — Progress Notes (Signed)
Patient ID: Tonya Hunter, female   DOB: February 15, 1935, 85 y.o.   MRN: [id number]   SW returned phone call/left message for pt husband [name] [phone]) requesting return phone call to discuss updates from team conference. SW waiting on follow-up.   [name], MSW, [place] Office: [phone] Cell: [phone] Fax: [phone]

## 2019-10-04 NOTE — Progress Notes (Signed)
Speech Language Pathology Daily Session Note  Patient Details  Name: EVORA SCHECHTER MRN: [id number] Date of Birth: 1934-12-02  Today's Date: 10/04/2019 SLP Individual Time: 1330-1355 SLP Individual Time Calculation (min): 25 min  Short Term Goals: Week 2: SLP Short Term Goal 1 (Week 2): Pt will demonstrate sustained attention during functional tasks in 10 minute intervals with min A verbal cues for redirection. SLP Short Term Goal 2 (Week 2): Pt will demonstrate recall of novel, daily information with use of visual aids given min A verbal cues. SLP Short Term Goal 3 (Week 2): Pt will complete basic functional problem solving tasks with min A verbal cues. SLP Short Term Goal 4 (Week 2): Pt will list 2 cognitive deficits and thier impact on function with min A verbal cues.  Skilled Therapeutic Interventions: Skilled treatment session focused on cognitive goals. SLP facilitated session by providing overall Max A verbal cues for sustained attention and problem solving while trying to locate specific information on a medication label. However, patient appeared brighter today more participatory compared to when this SLP last saw the patient. Patient left upright in bed with alarm on and all needs within reach. Continue with current plan of care.      Pain Pain Assessment Pain Score: 2   Therapy/Group: Individual Therapy  Zaydah Nawabi 10/04/2019, 2:49 PM

## 2019-10-04 NOTE — Plan of Care (Signed)
  Problem: RH Balance Goal: LTG Patient will maintain dynamic standing with ADLs (OT) Description: LTG:  Patient will maintain dynamic standing balance with assist during activities of daily living (OT)  Flowsheets (Taken 10/04/2019 1316) LTG: Pt will maintain dynamic standing balance during ADLs with: Moderate Assistance - Patient 50 - 74% Note: Downgraded 2/2 patient progress   Problem: Sit to Stand Goal: LTG:  Patient will perform sit to stand in prep for activites of daily living with assistance level (OT) Description: LTG:  Patient will perform sit to stand in prep for activites of daily living with assistance level (OT) Flowsheets (Taken 10/04/2019 1316) LTG: PT will perform sit to stand in prep for activites of daily living with assistance level: Moderate Assistance - Patient 50 - 74% Note: Downgraded 2/2 patient progress.    Problem: RH Bathing Goal: LTG Patient will bathe all body parts with assist levels (OT) Description: LTG: Patient will bathe all body parts with assist levels (OT) Flowsheets (Taken 10/04/2019 1316) LTG: Pt will perform bathing with assistance level/cueing: Moderate Assistance - Patient 50 - 74% Note: Downgraded 2/2 patient progress.    Problem: RH Dressing Goal: LTG Patient will perform lower body dressing w/assist (OT) Description: LTG: Patient will perform lower body dressing with assist, with/without cues in positioning using equipment (OT) Flowsheets (Taken 10/04/2019 1316) LTG: Pt will perform lower body dressing with assistance level of: Moderate Assistance - Patient 50 - 74% Note: Downgraded 2/2 patient progress.    Problem: RH Toileting Goal: LTG Patient will perform toileting task (3/3 steps) with assistance level (OT) Description: LTG: Patient will perform toileting task (3/3 steps) with assistance level (OT)  Flowsheets (Taken 10/04/2019 1316) LTG: Pt will perform toileting task (3/3 steps) with assistance level: Moderate Assistance - Patient 50 -  74% Note: Downgraded 2/2 patient progress.    Problem: RH Toilet Transfers Goal: LTG Patient will perform toilet transfers w/assist (OT) Description: LTG: Patient will perform toilet transfers with assist, with/without cues using equipment (OT) Flowsheets (Taken 10/04/2019 1316) LTG: Pt will perform toilet transfers with assistance level of: Moderate Assistance - Patient 50 - 74% Note: Downgraded 2/2 patient progress.    Problem: RH Tub/Shower Transfers Goal: LTG Patient will perform tub/shower transfers w/assist (OT) Description: LTG: Patient will perform tub/shower transfers with assist, with/without cues using equipment (OT) Flowsheets (Taken 10/04/2019 1316) LTG: Pt will perform tub/shower stall transfers with assistance level of: Moderate Assistance - Patient 50 - 74% Note: Downgraded 2/2 patient progress.   Patient with decreased participation requiring Maximal coaxing/encouragement. Patient continues to demonstrate decreased LUE AROM, fatigue, emotional lability, and pain with movement. Goals downgraded to reflect patient progress with skilled OT services.

## 2019-10-04 NOTE — Progress Notes (Signed)
Physical Therapy Weekly Progress Note  Patient Details  Name: Tonya Hunter MRN: [id number] Date of Birth: Sep 30, 1934  Beginning of progress report period: Sep 25, 2019 End of progress report period: Oct 04, 2019   Patient has met 1 of 4 short term goals.  Pt has made very slow progress toward PT goals, frequently refusing to participate with therapy and requiring maximal encouragement to mobilize each session. Pt has also demonstrated increased confusion during stay due to encephalopathy and has had consistent nausea with emesis, impeding progress toward mobility goals. When pt is able to be coaxed to participate in EOB and OOB activities, she demonstrates fair strength, trunk control and balance, but constantly requests to return to bed and has spent very little time in [facility] or participating in [place] activities.  Patient continues to demonstrate the following deficits muscle weakness, decreased cardiorespiratoy endurance, decreased initiation, decreased attention, decreased awareness, decreased problem solving, decreased safety awareness, decreased memory and delayed processing and decreased sitting balance, decreased standing balance, decreased postural control, decreased balance strategies and difficulty maintaining precautions and therefore will continue to benefit from skilled PT intervention to increase functional independence with mobility.  Patient not progressing toward long term goals.  See goal revision..  Plan of care revisions: Pt POC is now 15/7 due to frequent refusals and difficulty participating in 3 hours of therapy per day. Goals will be downgraded to modA due to slow progress..  PT Short Term Goals Week 1:  PT Short Term Goal 1 (Week 1): Pt will perform supine<>sit with modA PT Short Term Goal 1 - Progress (Week 1): Progressing toward goal PT Short Term Goal 2 (Week 1): Pt will perform bed<>WC with slideboard and modA. PT Short Term Goal 2 - Progress (Week 1): Progressing toward  goal PT Short Term Goal 3 (Week 1): Pt will perform sit to stand with maxA. PT Short Term Goal 3 - Progress (Week 1): Met PT Short Term Goal 4 (Week 1): Pt will perform 50' WC propulsion with minA. PT Short Term Goal 4 - Progress (Week 1): Not progressing Week 2:  PT Short Term Goal 2 (Week 2): Pt will perform supine<>sit with modA PT Short Term Goal 3 (Week 2): Pt will perform slideboard transfer with modA. PT Short Term Goal 4 (Week 2): Pt will perform sit to stand transfer with modA. PT Short Term Goal 5 (Week 2): Pt will propel WC 25' with minA.   Breck Coons, PT, DPT 10/04/2019, 2:38 PM

## 2019-10-04 NOTE — Progress Notes (Signed)
Physical Therapy Session Note  Patient Details  Name: Tonya Hunter MRN: [id number] Date of Birth: July 01, 1934  Today's Date: 10/04/2019 PT Individual Time: 0937-1030 PT Individual Time Calculation (min): 53 min   Short Term Goals: Week 2:     Skilled Therapeutic Interventions/Progress Updates: Pt presents semi-reclined in bed, and requires encouragement to participate w/ therapy.  Pt w/ emesis bag full and noted emesis on chest and scrub top.  Pt performed transfer to long sit using UEs on side rails and CGA, then assisted w/ doff top using mostly right UE.  Pt used washcloth to clean upper body/chest and dry.  Pt again sat in long sit to don scrub top w/ mod A for maintaining balance.  Pt requires verbal cues for speed of eccentric sit to supine.  Pt performed multiple sup to sit to pull top down in back and on side.  Pt performed rolling side to side to change chux sheet.  Pt performed LE there ex w/ verbal and visual cues for AP, abd/add (AAROM), HS (AAROM).  Pt remained in bed, even though max encouragement given to transfer to EOB and OOB.  Pt refused and says sometimes you just can't.  This PT reminded her that each time I have worked w/ her, she has said she can't.  Educated on need to transfer and roll to improve circulation to buttocks for skin integrity.  Pt left supine in bed w/ bed alarm on and all needs in place.       Therapy Documentation Precautions:  Precautions Precautions: Fall Precaution Comments: Pt h/o confusion Required Braces or Orthoses: Other Brace Other Brace: AFO Restrictions Weight Bearing Restrictions: Yes RLE Weight Bearing: Partial weight bearing Other Position/Activity Restrictions: RLE General:   Vital Signs:   Pain: pt c/o pain right leg below knee, but not quantifying. Pain Assessment Pain Scale: 0-10 Pain Score: 7  Mobility:      Therapy/Group: Individual Therapy  Ladoris Gene 10/04/2019, 10:36 AM

## 2019-10-04 NOTE — Plan of Care (Signed)
Pt goals downgraded due to slow progress, frequent refusals, and increasing confusion and encephalopathy.
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
## 2019-10-05 ENCOUNTER — Inpatient Hospital Stay (HOSPITAL_COMMUNITY): Payer: BLUE CROSS/BLUE SHIELD | Admitting: Occupational Therapy

## 2019-10-05 ENCOUNTER — Inpatient Hospital Stay (HOSPITAL_COMMUNITY): Payer: BLUE CROSS/BLUE SHIELD | Admitting: Physical Therapy

## 2019-10-05 LAB — GLUCOSE, CAPILLARY
Glucose-Capillary: 140 mg/dL — ABNORMAL HIGH (ref 70–99)
Glucose-Capillary: 243 mg/dL — ABNORMAL HIGH (ref 70–99)
Glucose-Capillary: 125 mg/dL — ABNORMAL HIGH (ref 70–99)
Glucose-Capillary: 166 mg/dL — ABNORMAL HIGH (ref 70–99)

## 2019-10-05 MED ORDER — SORBITOL 70 % SOLN
30.0000 mL | Freq: Once | Status: AC
  Administered 2019-10-05: 30 mL via ORAL
  Filled 2019-10-05: qty 30

## 2019-10-05 NOTE — Progress Notes (Signed)
Physical Therapy Session Note  Patient Details  Name: Tonya Hunter MRN: [id number] Date of Birth: January 22, 1935  Today's Date: 10/05/2019 PT Individual Time: 0900-0945 PT Individual Time Calculation (min): 45 min   Short Term Goals: Week 1:  PT Short Term Goal 1 (Week 1): Pt will perform supine<>sit with modA PT Short Term Goal 1 - Progress (Week 1): Progressing toward goal PT Short Term Goal 2 (Week 1): Pt will perform bed<>WC with slideboard and modA. PT Short Term Goal 2 - Progress (Week 1): Progressing toward goal PT Short Term Goal 3 (Week 1): Pt will perform sit to stand with maxA. PT Short Term Goal 3 - Progress (Week 1): Met PT Short Term Goal 4 (Week 1): Pt will perform 50' WC propulsion with minA. PT Short Term Goal 4 - Progress (Week 1): Not progressing  Skilled Therapeutic Interventions/Progress Updates:  Pt was seen bedside in the am. Pt initially agreed to participate with therapy stating she wanted to sit. Pt rolled multiple times to assist with donning pants with mod A and side rails with verbal cues. Pt dependent for pulling up pants. Performed AAROM B LEs, 2sets x 10 reps each, heel slides, hip abd/add and SAQs. Pt rolled R/L with side rails to change brief with mod A and verbal cues. Pt dependent for hygiene. As therapist attempted to transition to edge of bed and out of bed. Pt began to refuse, stating she just needed to sit up in bed right now. Therapist educated pt on PT plan of care as well as the benefits of therapy and out of bed to chair. Pt verbalized understanding but continued to state she just needed time. Therapist continued to encourage participation. Pt continued to refuse stating I just need more time. Pt unwilling to attempt edge of bed or out of bed at this time despite eduction. Pt left sitting up in bed with all needs within reach.   Therapy Documentation Precautions:  Precautions Precautions: Fall Precaution Comments: Pt h/o confusion Required Braces or  Orthoses: Other Brace Other Brace: AFO Restrictions Weight Bearing Restrictions: Yes RLE Weight Bearing: Partial weight bearing Other Position/Activity Restrictions: RLE General: PT Amount of Missed Time (min): 15 Minutes PT Missed Treatment Reason: Pain;Patient unwilling to participate;Patient fatigue Pain: Pt c/o pain R LE with movement, medicated for pain at beginning of therapy session.   Therapy/Group: Individual Therapy  Dub Amis 10/05/2019, 9:54 AM

## 2019-10-05 NOTE — Progress Notes (Signed)
Occupational Therapy Session Note  Patient Details  Name: Desarie S Auzenne MRN: [id number] Date of Birth: 02/06/1935  Today's Date: 10/05/2019 OT Individual Time: 1400-1424 OT Individual Time Calculation (min): 24 min    Short Term Goals: Week 1:  OT Short Term Goal 1 (Week 1): Patient will thread BLE into pants with Mod A and use of AE/DME as needed. OT Short Term Goal 1 - Progress (Week 1): Progressing toward goal OT Short Term Goal 2 (Week 1): Patient will complete SB transfer with 1 person assist. OT Short Term Goal 2 - Progress (Week 1): Met OT Short Term Goal 3 (Week 1): Patient will complete toilet transfer to BSC with SB and 1 person assist with adherence to RLE weightbearing precautions. OT Short Term Goal 3 - Progress (Week 1): Progressing toward goal OT Short Term Goal 4 (Week 1): Patient will complete 1/3 parts of toileting with 1 person assist. OT Short Term Goal 4 - Progress (Week 1): Progressing toward goal  Skilled Therapeutic Interventions/Progress Updates: Upon approach for OT, patient resting and bed and speaking about situations to which this clinician was not familiear.  She sounded slightly confused.  When asked to participate, she stated, "I just settled from that pain, and I do not want to move."  However, after this clinician explained to her that she may get further deconditioned if she does not move around, stretch or complete exercises, she participated in upper extremity  Exercises and endurance training to increase her overall conditioning to incraease strength and caregiver burden and mobility.    She asked for ice chips and this clinician retrieved them for other.    Her call bell was placed within her reach and she was educated which button to press if she needs assist  Continue OT Plan of Care       Therapy Documentation Precautions:  Precautions Precautions: Fall Precaution Comments: Pt h/o confusion Required Braces or Orthoses: Other Brace Other  Brace: AFO Restrictions Weight Bearing Restrictions: Yes RLE Weight Bearing: Partial weight bearing Other Position/Activity Restrictions: RLE General: General OT Amount of Missed Time: 36 Minutes refused, complained of much pain and fatigue    Pain:I am good now.  But I just got settled with that pain.        Therapy/Group: Individual Therapy  ,  [place] 10/05/2019, 3:35 PM  
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
## 2019-10-06 ENCOUNTER — Inpatient Hospital Stay (HOSPITAL_COMMUNITY): Payer: BLUE CROSS/BLUE SHIELD

## 2019-10-06 ENCOUNTER — Inpatient Hospital Stay (HOSPITAL_COMMUNITY): Payer: BLUE CROSS/BLUE SHIELD | Admitting: Physical Therapy

## 2019-10-06 LAB — CBC WITH DIFFERENTIAL/PLATELET
Abs Immature Granulocytes: 0.01 10*3/uL (ref 0.00–0.07)
Basophils Absolute: 0 10*3/uL (ref 0.0–0.1)
Basophils Relative: 1 %
Eosinophils Absolute: 0.2 10*3/uL (ref 0.0–0.5)
Eosinophils Relative: 5 %
HCT: 31.7 % — ABNORMAL LOW (ref 36.0–46.0)
Hemoglobin: 10.1 g/dL — ABNORMAL LOW (ref 12.0–15.0)
Immature Granulocytes: 0 %
Lymphocytes Relative: 25 %
Lymphs Abs: 0.9 10*3/uL (ref 0.7–4.0)
MCH: 32.1 pg (ref 26.0–34.0)
MCHC: 31.9 g/dL (ref 30.0–36.0)
MCV: 100.6 fL — ABNORMAL HIGH (ref 80.0–100.0)
Monocytes Absolute: 0.5 10*3/uL (ref 0.1–1.0)
Monocytes Relative: 15 %
Neutro Abs: 1.9 10*3/uL (ref 1.7–7.7)
Neutrophils Relative %: 54 %
Platelets: 125 10*3/uL — ABNORMAL LOW (ref 150–400)
RBC: 3.15 MIL/uL — ABNORMAL LOW (ref 3.87–5.11)
WBC: 3.5 10*3/uL — ABNORMAL LOW (ref 4.0–10.5)
nRBC: 0 % (ref 0.0–0.2)

## 2019-10-06 LAB — GLUCOSE, CAPILLARY
Glucose-Capillary: 101 mg/dL — ABNORMAL HIGH (ref 70–99)
Glucose-Capillary: 117 mg/dL — ABNORMAL HIGH (ref 70–99)
Glucose-Capillary: 151 mg/dL — ABNORMAL HIGH (ref 70–99)
Glucose-Capillary: 99 mg/dL (ref 70–99)

## 2019-10-06 LAB — COMPREHENSIVE METABOLIC PANEL
ALT: 28 U/L (ref 0–44)
AST: 53 U/L — ABNORMAL HIGH (ref 15–41)
Albumin: 2 g/dL — ABNORMAL LOW (ref 3.5–5.0)
Alkaline Phosphatase: 311 U/L — ABNORMAL HIGH (ref 38–126)
Anion gap: 6 (ref 5–15)
BUN: 26 mg/dL — ABNORMAL HIGH (ref 8–23)
CO2: 21 mmol/L — ABNORMAL LOW (ref 22–32)
Calcium: 8.1 mg/dL — ABNORMAL LOW (ref 8.9–10.3)
Chloride: 102 mmol/L (ref 98–111)
Creatinine, Ser: 1.06 mg/dL — ABNORMAL HIGH (ref 0.44–1.00)
GFR calc Af Amer: 56 mL/min — ABNORMAL LOW (ref >60)
GFR calc non Af Amer: 49 mL/min — ABNORMAL LOW (ref >60)
Glucose, Bld: 113 mg/dL — ABNORMAL HIGH (ref 70–99)
Potassium: 4.7 mmol/L (ref 3.5–5.1)
Sodium: 129 mmol/L — ABNORMAL LOW (ref 135–145)
Total Bilirubin: 1.7 mg/dL — ABNORMAL HIGH (ref 0.3–1.2)
Total Protein: 5.4 g/dL — ABNORMAL LOW (ref 6.5–8.1)

## 2019-10-06 MED ORDER — MAGNESIUM CITRATE PO SOLN
1.0000 | Freq: Once | ORAL | Status: AC
  Administered 2019-10-06: 1 via ORAL
  Filled 2019-10-06: qty 296

## 2019-10-06 NOTE — Progress Notes (Signed)
Occupational Therapy Session Note  Patient Details  Name: Tonya Hunter MRN: [id number] Date of Birth: 12/19/1934  Today's Date: 10/06/2019 OT Individual Time: 1420-1515 OT Individual Time Calculation (min): 55 min    Short Term Goals: Week 2:  OT Short Term Goal 1 (Week 2): Patient will thread BLE into pants with Mod A and use of AE/DME as needed. OT Short Term Goal 2 (Week 2): Patient will complete SB transfer with Mod A. OT Short Term Goal 3 (Week 2): Patient will complete toilet transfer to [facility] with SB and 1 person assist with adherence to RLE weightbearing precautions. OT Short Term Goal 4 (Week 2): Patient will complete 1/3 parts of toileting with 1 person assist.  Skilled Therapeutic Interventions/Progress Updates:    Pt received supine with c/o pain in back from "not sitting up enough". Pt assisted in elevating head of bed and she reported pain alleviated. Pt agreeable to bed level ADLs. Pt completed rolling R and L with min A overall, stating pain increased to an 8/10 in her back but alleviated with rest breaks frequently. Pt washed UB with cueing for initiation and min A overall. Max A required for LB bathing. New brief donned and peri hygiene completed with max A. Pt donned new gown with mod A. Pt frequently repeated herself during session and was oriented to place only. Pt completed oral hygiene at bed level with set up assist. BLE were propped up on pillows for edema management and pt cued through 2 sets of ankle pumps.  Pt was left sitting up in bed with bed alarm set and drink + italian ice provided per request.   Therapy Documentation Precautions:  Precautions Precautions: Fall Precaution Comments: Pt h/o confusion Required Braces or Orthoses: Other Brace Other Brace: AFO Restrictions Weight Bearing Restrictions: Yes RLE Weight Bearing: Partial weight bearing Other Position/Activity Restrictions: RLE   Therapy/Group: Individual Therapy  Curtis Sites 10/06/2019,  3:19 PM

## 2019-10-06 NOTE — Progress Notes (Signed)
Physical Therapy Session Note  Patient Details  Name: Tonya Hunter MRN: [id number] Date of Birth: 24-Mar-1935  Today's Date: 10/06/2019 PT Individual Time: 0092-3300 PT Individual Time Calculation (min): 30 min  Missed time : 30' Short Term Goals: Week 2:  PT Short Term Goal 2 (Week 2): Pt will perform supine<>sit with modA PT Short Term Goal 3 (Week 2): Pt will perform slideboard transfer with modA. PT Short Term Goal 4 (Week 2): Pt will perform sit to stand transfer with modA. PT Short Term Goal 5 (Week 2): Pt will propel WC 25' with minA.  Skilled Therapeutic Interventions/Progress Updates: Pt presents supine in bed, very tearful and attempting to refuse therapy.  Pt educated on need to move and get stronger, immobility will not make pain level improve.  Pt snapping at PT, telling me she doesn't want to hear it, but continued to stress mobility.  PT retrieved scrub pants and began donning, w/ pt lifting LEs and then flexing knees to pull up over knees.  Pt rolling side to side w/ side rails and mod to min A once initiated, for total A to pull pants over hips.  Pt moves LEs sequentially off EOB w/ verbal and manual cueing and then sup to sit w/ min to mod A.  Pt states cannot sit up, but then achieves it when manually cued.  Pt scooted to EOB, while tearing up c/o 8/10 pain.  Pt performed lateral scoot to HOB x 10-12 scoots maintaining TDWB RLE.  Pt required Mod A for LEs sup to sit, but does lift LEs.  Pt required roll to position chux pad under.  Pt refusing any further therapy at this time.  Re-iterated to pt that  Therapy will be continuing every day and needs to participate to improve.  Bed alarm on w/ needs in reach.  Handed off to NT.     Therapy Documentation Precautions:  Precautions Precautions: Fall Precaution Comments: Pt h/o confusion Required Braces or Orthoses: Other Brace Other Brace: AFO Restrictions Weight Bearing Restrictions: Yes RLE Weight Bearing: Partial weight  bearing Other Position/Activity Restrictions: RLE General: PT Amount of Missed Time (min): 30 Minutes PT Missed Treatment Reason: Nursing care;Other (Comment)(and refuses further.) Vital Signs:   Pain: pt c/o 8/10 pain right LE below knee.  Pt unsure of pain med status, will call for meds after therapy. Pain Assessment Pain Scale: 0-10 Pain Score: 0-No pain Mobility:      Therapy/Group: Individual Therapy  Ladoris Gene 10/06/2019, 12:29 PM
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
## 2019-10-06 NOTE — Progress Notes (Signed)
LBM 5/20, MD aware, miralax, lactulose and magnesium citrate given but still no success with bowel. Patient vomited with 300 cc of clear liquid. We continue to monitor.

## 2019-10-07 ENCOUNTER — Inpatient Hospital Stay (HOSPITAL_COMMUNITY): Payer: BLUE CROSS/BLUE SHIELD | Admitting: Speech Pathology

## 2019-10-07 ENCOUNTER — Inpatient Hospital Stay (HOSPITAL_COMMUNITY): Payer: Medicare Other

## 2019-10-07 ENCOUNTER — Inpatient Hospital Stay (HOSPITAL_COMMUNITY): Payer: BLUE CROSS/BLUE SHIELD

## 2019-10-07 ENCOUNTER — Inpatient Hospital Stay (HOSPITAL_COMMUNITY): Payer: BLUE CROSS/BLUE SHIELD | Admitting: Occupational Therapy

## 2019-10-07 LAB — CBC
HCT: 36.2 % (ref 36.0–46.0)
Hemoglobin: 11.8 g/dL — ABNORMAL LOW (ref 12.0–15.0)
MCH: 32.4 pg (ref 26.0–34.0)
MCHC: 32.6 g/dL (ref 30.0–36.0)
MCV: 99.5 fL (ref 80.0–100.0)
Platelets: 158 10*3/uL (ref 150–400)
RBC: 3.64 MIL/uL — ABNORMAL LOW (ref 3.87–5.11)
WBC: 4.5 10*3/uL (ref 4.0–10.5)
nRBC: 0 % (ref 0.0–0.2)

## 2019-10-07 LAB — GLUCOSE, CAPILLARY
Glucose-Capillary: 115 mg/dL — ABNORMAL HIGH (ref 70–99)
Glucose-Capillary: 154 mg/dL — ABNORMAL HIGH (ref 70–99)
Glucose-Capillary: 153 mg/dL — ABNORMAL HIGH (ref 70–99)
Glucose-Capillary: 176 mg/dL — ABNORMAL HIGH (ref 70–99)

## 2019-10-07 LAB — BASIC METABOLIC PANEL
Anion gap: 12 (ref 5–15)
BUN: 28 mg/dL — ABNORMAL HIGH (ref 8–23)
CO2: 17 mmol/L — ABNORMAL LOW (ref 22–32)
Calcium: 8.4 mg/dL — ABNORMAL LOW (ref 8.9–10.3)
Chloride: 99 mmol/L (ref 98–111)
Creatinine, Ser: 1.14 mg/dL — ABNORMAL HIGH (ref 0.44–1.00)
GFR calc Af Amer: 51 mL/min — ABNORMAL LOW (ref >60)
GFR calc non Af Amer: 44 mL/min — ABNORMAL LOW (ref >60)
Glucose, Bld: 192 mg/dL — ABNORMAL HIGH (ref 70–99)
Potassium: 4.7 mmol/L (ref 3.5–5.1)
Sodium: 128 mmol/L — ABNORMAL LOW (ref 135–145)

## 2019-10-07 NOTE — Progress Notes (Addendum)
Patient ID: Tonya Hunter, female   DOB: Jul 14, 1934, 85 y.o.   MRN: [id number]   SW returned phone call/left message for tp husband [name] [phone]) to inform him that concerns related to his wife's sodium concerns and wanting to make sure her sodium was restricted due to there cirrhosis  were relayed to her current nurse, and encouraged follow-up if there were any other questions/concerns.   *SW received return call from pt husband. SW clarified SW role as husband stated he spoke with SW about sodium. Patient husband did express other concerns which were reported to charge nurse.   [name], MSW, [place] Office: [phone] Cell: [phone] Fax: [phone]

## 2019-10-07 NOTE — Plan of Care (Signed)
  Problem: RH Problem Solving Goal: LTG Patient will demonstrate problem solving for (SLP) Description: LTG:  Patient will demonstrate problem solving for basic/complex daily situations with cues  (SLP) Flowsheets (Taken 10/07/2019 0631) LTG Patient will demonstrate problem solving for: Moderate Assistance - Patient 50 - 74% Note: Downgraded due to slow progress and limited participation    Problem: RH Memory Goal: LTG Patient will use memory compensatory aids to (SLP) Description: LTG:  Patient will use memory compensatory aids to recall biographical/new, daily complex information with cues (SLP) Flowsheets (Taken 10/07/2019 0631) LTG: Patient will use memory compensatory aids to (SLP): Moderate Assistance - Patient 50 - 74% Note: Downgraded due to slow progress and limited participation    Problem: RH Awareness Goal: LTG: Patient will demonstrate awareness during functional activites type of (SLP) Description: LTG: Patient will demonstrate awareness during functional activites type of (SLP) Flowsheets (Taken 10/07/2019 0631) LTG: Patient will demonstrate awareness during cognitive/linguistic activities with assistance of (SLP): Moderate Assistance - Patient 50 - 74% Note: Downgraded due to slow progress and limited participation    Problem: RH Attention Goal: LTG Patient will demonstrate this level of attention during functional activites (SLP) Description: LTG:  Patient will will demonstrate this level of attention during functional activites (SLP) Flowsheets (Taken 10/07/2019 0631) LTG: Patient will demonstrate this level of attention during cognitive/linguistic activities with assistance of (SLP): Moderate Assistance - Patient 50 - 74% Note: Downgraded due to slow progress and limited participation

## 2019-10-07 NOTE — Progress Notes (Signed)
Occupational Therapy Session Note  Patient Details  Name: Tonya Hunter MRN: [id number] Date of Birth: March 27, 1935  Today's Date: 10/07/2019 OT Individual Time: 1130-1202 OT Individual Time Calculation (min): 32 min    Short Term Goals: Week 2:  OT Short Term Goal 1 (Week 2): Patient will thread BLE into pants with Mod A and use of AE/DME as needed. OT Short Term Goal 2 (Week 2): Patient will complete SB transfer with Mod A. OT Short Term Goal 3 (Week 2): Patient will complete toilet transfer to [facility] with SB and 1 person assist with adherence to RLE weightbearing precautions. OT Short Term Goal 4 (Week 2): Patient will complete 1/3 parts of toileting with 1 person assist.  Skilled Therapeutic Interventions/Progress Updates:    Patient in bed, alert and oriented to place and month.  She requires encouragement to participate in therapeutic activities.  Max A to change soiled brief.  She is able to roll right and left with min A.  Grooming:  Hair washing with shampoo cap max A.  She combs hair with mod A.  Washing face mod A.  Completed AAROM bilateral shoulders with encouragement.  She remained in bed, bed alarm set and call bell in reach at close of session.    Therapy Documentation Precautions:  Precautions Precautions: Fall Precaution Comments: Pt h/o confusion Required Braces or Orthoses: Other Brace Other Brace: AFO Restrictions Weight Bearing Restrictions: Yes RLE Weight Bearing: Partial weight bearing Other Position/Activity Restrictions: RLE   Therapy/Group: Individual Therapy  Carlos Levering 10/07/2019, 2:03 PM

## 2019-10-07 NOTE — Plan of Care (Signed)
  Problem: Consults Goal: RH GENERAL PATIENT EDUCATION Description: See Patient Education module for education specifics. Outcome: Progressing Goal: Skin Care Protocol Initiated - if Braden Score 18 or less Description: If consults are not indicated, leave blank or document N/A Outcome: Progressing Goal: Nutrition Consult-if indicated Outcome: Progressing Goal: Diabetes Guidelines if Diabetic/Glucose > 140 Description: If diabetic or lab glucose is > 140 mg/dl - Initiate Diabetes/Hyperglycemia Guidelines & Document Interventions  Outcome: Progressing   Problem: RH BOWEL ELIMINATION Goal: RH STG MANAGE BOWEL WITH ASSISTANCE Description: STG Manage Bowel with mod Assistance. Outcome: Progressing Goal: RH STG MANAGE BOWEL W/MEDICATION W/ASSISTANCE Description: STG Manage Bowel with Medication with min Assistance. Outcome: Progressing   Problem: RH BLADDER ELIMINATION Goal: RH STG MANAGE BLADDER WITH ASSISTANCE Description: STG Manage Bladder With max Assistance Outcome: Progressing Goal: RH STG MANAGE BLADDER WITH MEDICATION WITH ASSISTANCE Description: STG Manage Bladder With Medication With min Assistance. Outcome: Progressing   Problem: RH SKIN INTEGRITY Goal: RH STG SKIN FREE OF INFECTION/BREAKDOWN Description: Skin to remain free from infection and additional breakdown while on rehab with mod assistance. Outcome: Progressing Goal: RH STG MAINTAIN SKIN INTEGRITY WITH ASSISTANCE Description: STG Maintain Skin Integrity With mod Assistance. Outcome: Progressing Goal: RH STG ABLE TO PERFORM INCISION/WOUND CARE W/ASSISTANCE Description: STG Able To Perform Incision/Wound Care With mod Assistance. Outcome: Progressing   Problem: RH SAFETY Goal: RH STG ADHERE TO SAFETY PRECAUTIONS W/ASSISTANCE/DEVICE Description: STG Adhere to Safety Precautions With mod Assistance and appropriate assistive Device. Outcome: Progressing Goal: RH STG DECREASED RISK OF FALL WITH  ASSISTANCE Description: STG Decreased Risk of Fall With mod Assistance. Outcome: Progressing   Problem: RH PAIN MANAGEMENT Goal: RH STG PAIN MANAGED AT OR BELOW PT'S PAIN GOAL Description: <4 on a 0-10 pain scale. Outcome: Progressing   Problem: RH KNOWLEDGE DEFICIT GENERAL Goal: RH STG INCREASE KNOWLEDGE OF SELF CARE AFTER HOSPITALIZATION Description: Patient and caregiver will demonstrate knowledge of medication management, weightbearing restrictions, and follow up care with the MD after discharge with min assist from Veteran staff. Outcome: Progressing
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
## 2019-10-07 NOTE — Progress Notes (Signed)
Speech Language Pathology Weekly Progress and Session Note  Patient Details  Name: Tonya Hunter MRN: [id number] Date of Birth: February 11, 1935  Beginning of progress report period: Sep 30, 2019 End of progress report period: Oct 07, 2019  Today's Date: 10/07/2019 SLP Individual Time: 1010-1050 SLP Individual Time Calculation (min): 40 min  Short Term Goals: Week 2: SLP Short Term Goal 1 (Week 2): Pt will demonstrate sustained attention during functional tasks in 10 minute intervals with min A verbal cues for redirection. SLP Short Term Goal 1 - Progress (Week 2): Not met SLP Short Term Goal 2 (Week 2): Pt will demonstrate recall of novel, daily information with use of visual aids given min A verbal cues. SLP Short Term Goal 2 - Progress (Week 2): Not met SLP Short Term Goal 3 (Week 2): Pt will complete basic functional problem solving tasks with min A verbal cues. SLP Short Term Goal 3 - Progress (Week 2): Not met SLP Short Term Goal 4 (Week 2): Pt will list 2 cognitive deficits and thier impact on function with min A verbal cues. SLP Short Term Goal 4 - Progress (Week 2): Not met    New Short Term Goals: Week 3: SLP Short Term Goal 1 (Week 3): Pt will demonstrate sustained attention during functional tasks in 10 minute intervals with min A verbal cues for redirection. SLP Short Term Goal 2 (Week 3): Pt will demonstrate recall of novel, daily information with use of visual aids given mod A verbal and visual cues. SLP Short Term Goal 3 (Week 3): Pt will complete basic functional problem solving tasks with mod A verbal cues. SLP Short Term Goal 4 (Week 3): Pt will list 2 cognitive deficits and thier impact on function with mod A verbal cues.  Weekly Progress Updates: Patient has made minimal gains and has not met any STGs this reporting period. Currently, patient requires overall Max A multimodal cues to complete functional and familiar tasks safely regarding sustained attention, functional  problem solving, intellectual awareness of deficits and recall of functional information. Patient's progress continues to be limited by decreased participation secondary to patient's reports of overall fatigue and pain which the physician is addressing. Patient and family education is ongoing. Patient would benefit from continued skilled SLP intervention to maximize her cognitive functioning and overall functional independence prior to discharge.      Intensity: Minumum of 1-2 x/day, 30 to 90 minutes Frequency: 3 to 5 out of 7 days Duration/Length of Stay: 10/16/19 Treatment/Interventions: Cognitive remediation/compensation;Cueing hierarchy;Functional tasks;Medication managment;Patient/family education;Internal/external aids;Therapeutic Activities;Environmental controls   Daily Session  Skilled Therapeutic Interventions: Skilled treatment session focused on cognitive goals. SLP facilitated session by providing Max A verbal cues for anticipatory awareness regarding patient's ability to manage her own medications at discharge. Patient reported she currently new all of her own medications, however, could only recall 2 out of 11 current medications. Despite this, patient continued to report she could manage her medications. SLP attempted to initiate a pill organizing task, however, patient received a phone call from a church member (wating for her phone call for 2 weeks) in which she required total A to problem solve how to use the phone and remained on the phone despite being in a therapy session, therefore, patient missed remaining 20 minutes of session.       Pain No/Denies Pain   Therapy/Group: Individual Therapy  Tonya Hunter 10/07/2019, 10:55 AM

## 2019-10-07 NOTE — Progress Notes (Signed)
Occupational Therapy Session Note  Patient Details  Name: KAMRY FARACI MRN: [id number] Date of Birth: 11/29/1934  Today's Date: 10/07/2019 OT Individual Time:  -   1300-1332, 32 minutes      Short Term Goals: Week 1:  OT Short Term Goal 1 (Week 1): Patient will thread BLE into pants with Mod A and use of AE/DME as needed. OT Short Term Goal 1 - Progress (Week 1): Progressing toward goal OT Short Term Goal 2 (Week 1): Patient will complete SB transfer with 1 person assist. OT Short Term Goal 2 - Progress (Week 1): Met OT Short Term Goal 3 (Week 1): Patient will complete toilet transfer to [facility] with SB and 1 person assist with adherence to RLE weightbearing precautions. OT Short Term Goal 3 - Progress (Week 1): Progressing toward goal OT Short Term Goal 4 (Week 1): Patient will complete 1/3 parts of toileting with 1 person assist. OT Short Term Goal 4 - Progress (Week 1): Progressing toward goal  Skilled Therapeutic Interventions/Progress Updates:    Pt greeted at time of session reclined in bed with HOB elevated. Pt hesitant to participate in OT session but she did participate with encouragement. Bed mobility tasks for transitioning from supine to sitting up at EOB with patient first coming up to long sitting in bed with max A to manage UB, Max A to manage BLEs to bring to EOB for upright sitting at EOB. Pt sat EOB with varying levels of support between Mod A (inititally) to CGA for approx 20 minutes. AROM of BLEs including knee flexion/extension and heel pumps in order to decrease edema (which is noted to be improved since previous sessions). Verbal cues for encouragement provided throughout. Lateral weight shifting at EOB with therapist assist to shift L and R to lift hips from bed surface to improve precursor skills for scooting to EOB and bed mobility tasks. Pt returned to supine with total A to manage UB and BLEs. Pt scooted up in bed and properly positioned with BLEs elevated to decrease  swelling and for comfort. All needs met, call bell in reach.   Therapy Documentation Precautions:  Precautions Precautions: Fall Precaution Comments: Pt h/o confusion Required Braces or Orthoses: Other Brace Other Brace: AFO Restrictions Weight Bearing Restrictions: Yes RLE Weight Bearing: Partial weight bearing Other Position/Activity Restrictions: RLE  Therapy/Group: Individual Therapy  Viona Gilmore 10/07/2019, 1:40 PM
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
## 2019-10-08 ENCOUNTER — Inpatient Hospital Stay (HOSPITAL_COMMUNITY): Payer: BLUE CROSS/BLUE SHIELD | Admitting: Occupational Therapy

## 2019-10-08 ENCOUNTER — Inpatient Hospital Stay (HOSPITAL_COMMUNITY): Payer: Medicare Other

## 2019-10-08 ENCOUNTER — Inpatient Hospital Stay (HOSPITAL_COMMUNITY): Payer: BLUE CROSS/BLUE SHIELD

## 2019-10-08 HISTORY — PX: IR PARACENTESIS: IMG2679

## 2019-10-08 LAB — GLUCOSE, CAPILLARY
Glucose-Capillary: 143 mg/dL — ABNORMAL HIGH (ref 70–99)
Glucose-Capillary: 140 mg/dL — ABNORMAL HIGH (ref 70–99)
Glucose-Capillary: 140 mg/dL — ABNORMAL HIGH (ref 70–99)
Glucose-Capillary: 179 mg/dL — ABNORMAL HIGH (ref 70–99)

## 2019-10-08 MED ORDER — LIDOCAINE HCL 1 % IJ SOLN
INTRAMUSCULAR | Status: DC | PRN
  Administered 2019-10-08: 10 mL

## 2019-10-08 MED ORDER — LIDOCAINE HCL 1 % IJ SOLN
INTRAMUSCULAR | Status: AC
  Filled 2019-10-08: qty 20

## 2019-10-08 NOTE — Progress Notes (Signed)
Speech Language Pathology Daily Session Note  Patient Details  Name: Tonya Hunter MRN: [id number] Date of Birth: 04/04/1935  Today's Date: 10/08/2019 SLP Individual Time: 1003-1029 SLP Individual Time Calculation (min): 26 min  Short Term Goals: Week 3: SLP Short Term Goal 1 (Week 3): Pt will demonstrate sustained attention during functional tasks in 10 minute intervals with min A verbal cues for redirection. SLP Short Term Goal 2 (Week 3): Pt will demonstrate recall of novel, daily information with use of visual aids given mod A verbal and visual cues. SLP Short Term Goal 3 (Week 3): Pt will complete basic functional problem solving tasks with mod A verbal cues. SLP Short Term Goal 4 (Week 3): Pt will list 2 cognitive deficits and thier impact on function with mod A verbal cues.  Skilled Therapeutic Interventions: Skilled ST services focused on cognitive skills. Pt was sitting up into cup while in bed upon entering room. Pt expressed nausea and siting up/mildly vomiting. SLP notified nursing staff and assisted pt with holding basin as well as cleaning basin. SLP facilitated sustained attention and basic problem solving skills utilizing safe verse unsafe picture cards, pt was able to identify unsafe situations with in 50% of opportunities given binary choices, however required max A verbal cues to identify unsafe situation when presented with one card. Pt continues to demonstrate reduced problem solving abilities, pt was able to list days of the week and months of the year with min A verbal cues, however unable to apply information ("what is the day before Wednesday?"), Pt was left in room with call bell within reach and bed alarm set. ST recommends to continue skilled ST services.      Pain Pain Assessment Faces Pain Scale: Hurts little more Pain Type: Acute pain Pain Location: Leg Pain Orientation: Left Pain Descriptors / Indicators: Discomfort Pain Onset: On-going Pain Intervention(s):  RN made aware  Therapy/Group: Individual Therapy  Silvano Garofano  [facility] 10/08/2019, 11:07 AM
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
## 2019-10-08 NOTE — Progress Notes (Signed)
Scanned pt for 557 ml. Attempted to cath pt and only retrieved 100 ml. PVR of pt was 457. Stomach was very firm and distended.
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
## 2019-10-08 NOTE — Procedures (Signed)
Ultrasound-guided  therapeutic paracentesis performed yielding 2.2 liters of straw colored fluid.  No immediate complications. EBL is none.

## 2019-10-08 NOTE — Progress Notes (Signed)
Physical Therapy Session Note  Patient Details  Name: Tonya Hunter MRN: [id number] Date of Birth: 1934/12/08  Today's Date: 10/08/2019 PT Individual Time: 9675-9163 PT Individual Time Calculation (min): 15 min   Short Term Goals: Week 2:  PT Short Term Goal 2 (Week 2): Pt will perform supine<>sit with modA PT Short Term Goal 3 (Week 2): Pt will perform slideboard transfer with modA. PT Short Term Goal 4 (Week 2): Pt will perform sit to stand transfer with modA. PT Short Term Goal 5 (Week 2): Pt will propel WC 25' with minA.  Skilled Therapeutic Interventions/Progress Updates:     Pt received supine in bed asleep. Easily roused and able to be encouraged to participate in therapy, though attempts to refuse. Supine to sit with modA and verbal and tactile cues for sequencing and trunk facilitation. Seated EOB, pt repeatedly requesting to return to supine, alternately crying and closing eyes and non participatory. PT attempts to engage pt for increased activity but pt unable to be redirected and continues to sob. ModA/maxA for return to supine. Pt left supine in bed with all needs within reach. Pt missed final 15 minutes due to refusal to participate.  Therapy Documentation Precautions:  Precautions Precautions: Fall Precaution Comments: Pt h/o confusion Required Braces or Orthoses: Other Brace Other Brace: AFO Restrictions Weight Bearing Restrictions: Yes RLE Weight Bearing: Partial weight bearing Other Position/Activity Restrictions: RLE   Therapy/Group: Individual Therapy  Breck Coons, PT, DPT 10/08/2019, 3:30 PM

## 2019-10-08 NOTE — Progress Notes (Signed)
Occupational Therapy Session Note  Patient Details  Name: CABELLA KIMM MRN: [id number] Date of Birth: 03-13-1935  Today's Date: 10/08/2019 OT Individual Time: 3382-5053  &  1300-1330 OT Individual Time Calculation (min): 45 min  & 30 min   Short Term Goals: Week 2:  OT Short Term Goal 1 (Week 2): Patient will thread BLE into pants with Mod A and use of AE/DME as needed. OT Short Term Goal 2 (Week 2): Patient will complete SB transfer with Mod A. OT Short Term Goal 3 (Week 2): Patient will complete toilet transfer to [facility] with SB and 1 person assist with adherence to RLE weightbearing precautions. OT Short Term Goal 4 (Week 2): Patient will complete 1/3 parts of toileting with 1 person assist.  Skilled Therapeutic Interventions/Progress Updates:    AM session:   Patient in bed, alert.  Bathing and dressing completed bed level.  She requires ongoing encouragement to participate in activities.  She had multiple episodes of vomiting small amounts t/o session - nursing aware.  LB bathing and dressing max /dep.  UB bathing and dressing mod/max A.  Rolling in bed with min/mod A and cues for technique -using rails.  Oral care completed with set up.  She remained in bed at close of session with bed alarm set and call bell in hand.     PM session:   Patient in bed, having difficulty with keeping eyes open this afternoon..  Completed bilateral UE and LE AAROM with ongoing cues to complete repetitions.   Attempted to engage patient with light activity but she declined various activities.  Positioned bilateral LEs on pillows to off load heels.  She remained in bed at close of session with bed alarm set and call bell in reach.    Therapy Documentation Precautions:  Precautions Precautions: Fall Precaution Comments: Pt h/o confusion Required Braces or Orthoses: Other Brace Other Brace: AFO Restrictions Weight Bearing Restrictions: Yes RLE Weight Bearing: Partial weight bearing Other  Position/Activity Restrictions: RLE  Therapy/Group: Individual Therapy  Carlos Levering 10/08/2019, 7:36 AM

## 2019-10-08 NOTE — Consult Note (Signed)
WOC Nurse Consult Note: Patient receiving care in [facility] [phone]. Reason for Consult: sacral pressure injury Wound type: unstageable Pressure Injury POA: Yes Measurement: 4 cm x 3 cm Wound bed: 100% yellow and grey slough Drainage (amount, consistency, odor) yellow drainage on existing dressing; no odor Periwound: fragile, but intact Dressing procedure/placement/frequency: Order for santyl already on chart and should be continued in the manner ordered.  There are two orders for mattress replacements dated 5/20, however, low air mattress not in in room.  I have alerted the ordering provider, P. Love, PA-C, of this fact. No additional interventions at this time.  Monitor the wound area(s) for worsening of condition such as: Signs/symptoms of infection,  Increase in size,  Development of or worsening of odor, Development of pain, or increased pain at the affected locations.  Notify the medical team if any of these develop.  Thank you for the consult.  Discussed plan of care with the patient and bedside nurse.  [place] nurse will not follow at this time.  Please re-consult the [place] team if needed.  Val Riles, RN, MSN, CWOCN, CNS-BC, pager [phone]

## 2019-10-09 ENCOUNTER — Inpatient Hospital Stay (HOSPITAL_COMMUNITY): Payer: BLUE CROSS/BLUE SHIELD

## 2019-10-09 ENCOUNTER — Inpatient Hospital Stay (HOSPITAL_COMMUNITY): Payer: BLUE CROSS/BLUE SHIELD | Admitting: Occupational Therapy

## 2019-10-09 LAB — GLUCOSE, CAPILLARY
Glucose-Capillary: 146 mg/dL — ABNORMAL HIGH (ref 70–99)
Glucose-Capillary: 149 mg/dL — ABNORMAL HIGH (ref 70–99)
Glucose-Capillary: 181 mg/dL — ABNORMAL HIGH (ref 70–99)
Glucose-Capillary: 224 mg/dL — ABNORMAL HIGH (ref 70–99)

## 2019-10-09 MED ORDER — LACTULOSE 10 GM/15ML PO SOLN
40.0000 g | Freq: Two times a day (BID) | ORAL | Status: DC
  Administered 2019-10-09 – 2019-10-29 (×40): 40 g via ORAL
  Filled 2019-10-09 (×41): qty 60

## 2019-10-09 NOTE — Progress Notes (Signed)
Patient ID: Tonya Hunter, female   DOB: October 24, 1934, 85 y.o.   MRN: [id number]    SW met with pt in room to discuss plan of care at discharge since her care needs have changed. Pt states that her husband will care for her, he understands her care needs, and he has agreed to not place her in a nursing home. SW assured her that there has been no discussion about nursing home placement. SW asked if it were possible to have a family discussion including her daughters to discuss the changes with her health. Pt refused stating that the relationship between her daughters: [name] (son in law Shanon Brow) and Tonya Hunter were not in a good space, and asked SW to not discuss further. Pt aware SW will follow-up with her husband.  SW left message for tp husband Tonya Hunter [phone]) requesting return phone call. Sw waiting on follow-up.  *SW spoke with pt husband Tonya Hunter to discuss  changes with regard to her health, and asked about a family meeting for all family members to discuss the best plan of care with the physician. Husband intends to speak with their dtr Tonya Hunter and will follow-up with SW. SW offered to speak with family if there are any questions/concerns.   [name], MSW, [place] Office: [phone] Cell: [phone] Fax: [phone]

## 2019-10-09 NOTE — Progress Notes (Signed)
Speech Language Pathology Daily Session Note  Patient Details  Name: Tonya Hunter MRN: [id number] Date of Birth: Dec 29, 1934  Today's Date: 10/09/2019 SLP Individual Time: 1884-1660 SLP Individual Time Calculation (min): 41 min  Short Term Goals: Week 3: SLP Short Term Goal 1 (Week 3): Pt will demonstrate sustained attention during functional tasks in 10 minute intervals with min A verbal cues for redirection. SLP Short Term Goal 2 (Week 3): Pt will demonstrate recall of novel, daily information with use of visual aids given mod A verbal and visual cues. SLP Short Term Goal 3 (Week 3): Pt will complete basic functional problem solving tasks with mod A verbal cues. SLP Short Term Goal 4 (Week 3): Pt will list 2 cognitive deficits and thier impact on function with mod A verbal cues.  Skilled Therapeutic Interventions: Skilled ST services focused on education and cognitive skills. Pt and pt's husband were in room upon enter, pt requested no ST services because " I am visiting." [employer] staff took pt's meal order and SLP facilitated sustained attention, recall and basic problem solving skills, pt required cuing for sustained attention in 2 minute intervals with intermittent language of confusion and mod A for problem solving while ordering meals. Pt began to demonstrate increase insight into deficits stating " I cant keep it all straight", but when questioned denied acute deficits. Pt requested ice cream and when SLP left the room pt's husband followed to communicate with SLP concerns. Pt's husband expressed concerns about Education officer, museum indicating pt is "terminal" when she notified him of a change of care and medical decline. SLP provided education pertaining to the palliative care consult and possibility of pt moving to a different area of the hospital (due to limited participation and medical needs) per communication during conference, but to refer all questions to pt's doctor. Pt's husband needs  further education on pt's cognitive deficits ( in conversation pt denied deficits and indicated pt was at cognitive baseline) and education about current medical needs. SLP also facilitated basic problem solving sustained attention skills utilizing picture card, pt demonstrated sustained attention in 3 minute intervals and required max A verbal cues to identify problems on basic picture cards. Pt was left in room with husband, call bell within reach and bed alarm set. ST recommends to continue skilled ST services.      Pain Pain Assessment Faces Pain Scale: No hurt  Therapy/Group: Individual Therapy  Anthonymichael Munday  [facility] 10/09/2019, 4:28 PM

## 2019-10-09 NOTE — Progress Notes (Signed)
Occupational Therapy Session Note  Patient Details  Name: Tonya Hunter MRN: [id number] Date of Birth: [birth year]  Today's Date: 10/09/2019 OT Individual Time: 1020-1100 OT Individual Time Calculation (min): 40 min    Short Term Goals: Week 2:  OT Short Term Goal 1 (Week 2): Patient will thread BLE into pants with Mod A and use of AE/DME as needed. OT Short Term Goal 2 (Week 2): Patient will complete SB transfer with Mod A. OT Short Term Goal 3 (Week 2): Patient will complete toilet transfer to [facility] with SB and 1 person assist with adherence to RLE weightbearing precautions. OT Short Term Goal 4 (Week 2): Patient will complete 1/3 parts of toileting with 1 person assist.  Skilled Therapeutic Interventions/Progress Updates:  Patient met lying supine in bed in agreement with OT treatment session with focus on self-care re-education and bed mobility as detailed below. Patient c/o pain in low back and abdomen with abdomen appearing more distended than the last time this therapist saw this patient. Patient found to be incontinent of bowel requiring Min A for rolling R<>L in supine with increased time. Patient able to maintain position in side-lying for hyiene/clothing management with Max A. Patient perseverating on husbands ability to care for her at home as she does not want to go to a SNF. Despite education on CLOF and need for increased assistance from others, patient still unaware of her deficits and the amount of assistance needed for BADLs. Session concluded with patient lying supine in bed with call bell within reach, bed alarm activated, and all needs met.    Therapy Documentation Precautions:  Precautions Precautions: Fall Precaution Comments: Pt h/o confusion Required Braces or Orthoses: Other Brace Other Brace: AFO Restrictions Weight Bearing Restrictions: Yes RLE Weight Bearing: Partial weight bearing Other Position/Activity Restrictions: RLE General:    Therapy/Group:  Individual Therapy  Martyn Timme R [place]Davis 10/09/2019, 7:57 AM
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
## 2019-10-09 NOTE — Progress Notes (Signed)
Physical Therapy Session Note  Patient Details  Name: Tonya Hunter MRN: [id number] Date of Birth: 1935/03/20  Today's Date: 10/09/2019 PT Individual Time: 9485-4627 PT Individual Time Calculation (min): 45 min   Short Term Goals: Week 2:  PT Short Term Goal 2 (Week 2): Pt will perform supine<>sit with modA PT Short Term Goal 3 (Week 2): Pt will perform slideboard transfer with modA. PT Short Term Goal 4 (Week 2): Pt will perform sit to stand transfer with modA. PT Short Term Goal 5 (Week 2): Pt will propel WC 25' with minA.  Skilled Therapeutic Interventions/Progress Updates:     Pt received supine in bed and agreeable to therapy. Reports pain distal to knees in BLEs. PT provides repositioning and rest breaks as needed during session to manage pain symptoms. Pt performs R roll with minA and bed rails, then R sidelying to sit with light minA and use of bed features. PT uninflates air mattress while pt seated EOB to prevent hips from sliding. Pt able to sit at EOB with supervision while taking morning medicine.  Slideboard transfer from bed to [facility] with modA and improved effort from pt relative to previous sessions. WC transport to therapy gym for energy conservation. Pt performs 3x10 BLE LAQs. Pt then performs targeted reaching to hit beach ball back to therapist to engage pt's core and improve activity tolerance. WC transport back to room.  Pt attempts to self propel WC but is unable to functionally perform task due to L wrist chronic deficits and poor effort with RUE.  Pt performs squat pivot from WC to bed to limit sheer on buttocks and requires maxA to complete. modA for sit to supine with PT managing BLEs. Pt left supine in bed with all needs within reach.  Therapy Documentation Precautions:  Precautions Precautions: Fall Precaution Comments: Pt h/o confusion Required Braces or Orthoses: Other Brace Other Brace: AFO Restrictions Weight Bearing Restrictions: Yes RLE Weight  Bearing: Partial weight bearing Other Position/Activity Restrictions: RLE    Therapy/Group: Individual Therapy  Breck Coons, PT, DPT 10/09/2019, 12:05 PM

## 2019-10-10 ENCOUNTER — Inpatient Hospital Stay (HOSPITAL_COMMUNITY): Payer: BLUE CROSS/BLUE SHIELD | Admitting: Occupational Therapy

## 2019-10-10 ENCOUNTER — Inpatient Hospital Stay (HOSPITAL_COMMUNITY): Payer: BLUE CROSS/BLUE SHIELD

## 2019-10-10 ENCOUNTER — Inpatient Hospital Stay (HOSPITAL_COMMUNITY): Payer: BLUE CROSS/BLUE SHIELD | Admitting: Speech Pathology

## 2019-10-10 LAB — CBC WITH DIFFERENTIAL/PLATELET
Abs Immature Granulocytes: 0.04 10*3/uL (ref 0.00–0.07)
Basophils Absolute: 0 10*3/uL (ref 0.0–0.1)
Basophils Relative: 1 %
Eosinophils Absolute: 0.1 10*3/uL (ref 0.0–0.5)
Eosinophils Relative: 2 %
HCT: 34.1 % — ABNORMAL LOW (ref 36.0–46.0)
Hemoglobin: 10.9 g/dL — ABNORMAL LOW (ref 12.0–15.0)
Immature Granulocytes: 1 %
Lymphocytes Relative: 27 %
Lymphs Abs: 1.1 10*3/uL (ref 0.7–4.0)
MCH: 32 pg (ref 26.0–34.0)
MCHC: 32 g/dL (ref 30.0–36.0)
MCV: 100 fL (ref 80.0–100.0)
Monocytes Absolute: 0.5 10*3/uL (ref 0.1–1.0)
Monocytes Relative: 12 %
Neutro Abs: 2.3 10*3/uL (ref 1.7–7.7)
Neutrophils Relative %: 57 %
Platelets: 140 10*3/uL — ABNORMAL LOW (ref 150–400)
RBC: 3.41 MIL/uL — ABNORMAL LOW (ref 3.87–5.11)
RDW: 19.7 % — ABNORMAL HIGH (ref 11.5–15.5)
WBC: 4.1 10*3/uL (ref 4.0–10.5)
nRBC: 0 % (ref 0.0–0.2)

## 2019-10-10 LAB — BASIC METABOLIC PANEL
Anion gap: 9 (ref 5–15)
BUN: 27 mg/dL — ABNORMAL HIGH (ref 8–23)
CO2: 21 mmol/L — ABNORMAL LOW (ref 22–32)
Calcium: 8.7 mg/dL — ABNORMAL LOW (ref 8.9–10.3)
Chloride: 103 mmol/L (ref 98–111)
Creatinine, Ser: 1.13 mg/dL — ABNORMAL HIGH (ref 0.44–1.00)
GFR calc Af Amer: 52 mL/min — ABNORMAL LOW (ref >60)
GFR calc non Af Amer: 45 mL/min — ABNORMAL LOW (ref >60)
Glucose, Bld: 140 mg/dL — ABNORMAL HIGH (ref 70–99)
Potassium: 4 mmol/L (ref 3.5–5.1)
Sodium: 133 mmol/L — ABNORMAL LOW (ref 135–145)

## 2019-10-10 LAB — GLUCOSE, CAPILLARY
Glucose-Capillary: 124 mg/dL — ABNORMAL HIGH (ref 70–99)
Glucose-Capillary: 121 mg/dL — ABNORMAL HIGH (ref 70–99)
Glucose-Capillary: 178 mg/dL — ABNORMAL HIGH (ref 70–99)

## 2019-10-10 LAB — AMMONIA: Ammonia: 73 umol/L — ABNORMAL HIGH (ref 9–35)

## 2019-10-10 MED ORDER — FUROSEMIDE 40 MG PO TABS
40.0000 mg | ORAL_TABLET | Freq: Every day | ORAL | Status: DC
  Administered 2019-10-11 – 2019-10-12 (×2): 40 mg via ORAL
  Filled 2019-10-10 (×2): qty 1

## 2019-10-10 NOTE — Progress Notes (Signed)
Physical Therapy Session Note  Patient Details  Name: Tonya Hunter MRN: [id number] Date of Birth: 04-20-1935  Today's Date: 10/10/2019 PT Individual Time: 0803-0902 PT Individual Time Calculation (min): 59 min   Short Term Goals: Week 2:  PT Short Term Goal 2 (Week 2): Pt will perform supine<>sit with modA PT Short Term Goal 3 (Week 2): Pt will perform slideboard transfer with modA. PT Short Term Goal 4 (Week 2): Pt will perform sit to stand transfer with modA. PT Short Term Goal 5 (Week 2): Pt will propel WC 25' with minA.  Skilled Therapeutic Interventions/Progress Updates:     Pt received R sidelying in bed and agreeable to therapy. Reports pain in BLEs. Number not provided. PT provides repositioning and mobility to address pain. Sidelying to sit with light minA. PT dons pt's socks at EOB. Pt performs squat pivot transfer to L with heavy modA. WC transport to gym for energy conservation. Squat pivot from WC to mat table with modA. Pt attempts performing seated press ups using bolster blocks but is unable to complete. Pt performs seated NM re-ed, tossing and tapping beach ball back to therapist. Pt is able to maintain sitting balance with close supervision and does not have any LOBs. Pt also performs targeted reaching for therapist's fingers but has difficulty sequencing task and maintaining concentration to complete. Pt becomes nauseous and is + for emesis several times during remainder of session. Squat pivot > WC > bed with modA and sit to supine with modA and PT managing BLEs. Pt left supine in bed with speech therapy present.  Therapy Documentation Precautions:  Precautions Precautions: Fall Precaution Comments: Pt h/o confusion Required Braces or Orthoses: Other Brace Other Brace: AFO Restrictions Weight Bearing Restrictions: Yes RLE Weight Bearing: Touchdown weight bearing Other Position/Activity Restrictions: RLE    Therapy/Group: Individual Therapy  Breck Coons, PT,  DPT 10/10/2019, 3:47 PM

## 2019-10-10 NOTE — Progress Notes (Signed)
Patient ID: Tonya Hunter, female   DOB: 1935-02-22, 85 y.o.   MRN: [id number]   SW met with pt, pt husband, and attending in room to discuss pt current medical condition.Pt and pt husband would like to know if a liver transplant is an option.  Pt and pt husband aware palliative care consult has been entered to discuss options on how to make her comfortable. Pt husband reports that he has left messages for her daughters. Pt is adamant about the children not being involved despite understanding that her care needs have changed.   SW will continue to assess pt for discharge needs.   [name], MSW, [place] Office: [phone] Cell: [phone] Fax: [phone]

## 2019-10-10 NOTE — Progress Notes (Signed)
Palliative:  I attempted to visit x 2 today but no family at bedside. RN reported she was told husband would return after 3 pm today but came by ~4:20 pm and no family. I did not feel that conversation without family and support would be fruitful in this situation. Will have one of my colleagues try again tomorrow.   No charge  Vinie Sill, NP Palliative Medicine Team Pager [phone] (Please see [email or web address] for schedule) Team Phone [phone]

## 2019-10-10 NOTE — Progress Notes (Signed)
Speech Language Pathology Daily Session Note  Patient Details  Name: Tonya Hunter MRN: [id number] Date of Birth: 1934/07/10  Today's Date: 10/10/2019  Session 1: SLP Individual Time: 4628-6381 SLP Individual Time Calculation (min): 12 min   Session 2: SLP Individual Time: 7711-6579 SLP Individual Time Calculation (min): 25 min   Short Term Goals: Week 3: SLP Short Term Goal 1 (Week 3): Pt will demonstrate sustained attention during functional tasks in 10 minute intervals with min A verbal cues for redirection. SLP Short Term Goal 2 (Week 3): Pt will demonstrate recall of novel, daily information with use of visual aids given mod A verbal and visual cues. SLP Short Term Goal 3 (Week 3): Pt will complete basic functional problem solving tasks with mod A verbal cues. SLP Short Term Goal 4 (Week 3): Pt will list 2 cognitive deficits and thier impact on function with mod A verbal cues.  Skilled Therapeutic Interventions:  Session 1: Skilled treatment session focused on cognitive goals. Upon arrival, patient's husband present. SLP facilitated session by providing ongoing education with the patient and her husband regarding her current cognitive impairments. Patient quickly reported that she can take care of herself despite ongoing cognitive deficits. Patient's husband had questions regarding a meeting today, CSW made aware. The CSW came into the room and addressed the husband's questions and the physician was also present to answer medical questions. Patient missed remaining 33 minutes of session due to meeting with physician and CSW. Will re-attempt as able.  Session 2: Skilled treatment session focused on cognitive goals. Upon arrival, patient was awake in bed. Patient reported discomfort in her position but was unable to verbalize what part of her body was uncomfortable or verbalize ways to improve comfort. Despite Max question cues, patient unable to answer specific questions regarding  positioning and was restlessly moving from side to side in bed. Patient perseverative on need for a pillow case but all pillows did have a pillowcase on them already. Patient also required hand over hand to initiate movement in attempting to reposition herself (reacihng to remove pillow, etc). After extended period of time, patient reported she was finally comfortable and needed to rest. Patient left supine in bed with alarm on and all needs within reach. Continue with current plan of care.      Pain Pain Assessment Pain Scale: 0-10 Pain Score: 0-No pain  Therapy/Group: Individual Therapy  Kaeden Mester 10/10/2019, 3:17 PM

## 2019-10-10 NOTE — Progress Notes (Signed)
Patient is restless overnight due to discomfort in lower back. Nurse repositioned patient every 2 hours and PRN to decreased discomfort. At 2:00 AM patient is crying c/o pain in mid lower back and not being comfortable in bed. Nurse administer oxycodone IR 10 mg for pain and repositioned patient as well. Nurse check patient at 3:00 AM, patient still awake but reported medicine decrease pain.

## 2019-10-10 NOTE — Progress Notes (Signed)
Occupational Therapy Weekly Progress Note  Patient Details  Name: Tonya Hunter MRN: [id number] Date of Birth: [birth year]  Beginning of progress report period: Oct 04, 2019 End of progress report period: Oct 10, 2019  Today's Date: 10/10/2019 OT Individual Time: 9629-5284 OT Individual Time Calculation (min): 57 min    Patient has met 0 of 3 short term goals.  Patient continues with nausea/vomiting, significant edema and difficulty with tolerating activity.  Her rolling in bed has improved to CS level.    Patient continues to demonstrate the following deficits: muscle weakness, decreased cardiorespiratoy endurance and decreased postural control and difficulty maintaining precautions and therefore will continue to benefit from skilled OT intervention to enhance overall performance with BADL, iADL and Reduce care partner burden.  Patient not progressing toward long term goals.  See goal revision..    OT Short Term Goals Week 2:  OT Short Term Goal 1 (Week 2): Patient will thread BLE into pants with Mod A and use of AE/DME as needed. OT Short Term Goal 1 - Progress (Week 2): Not met OT Short Term Goal 2 (Week 2): Patient will complete SB transfer with Mod A. OT Short Term Goal 2 - Progress (Week 2): Not met OT Short Term Goal 3 (Week 2): Patient will complete toilet transfer to [facility] with SB and 1 person assist with adherence to RLE weightbearing precautions. OT Short Term Goal 3 - Progress (Week 2): Not met OT Short Term Goal 4 (Week 2): Patient will complete 1/3 parts of toileting with 1 person assist. Week 3:  OT Short Term Goal 1 (Week 3): Patient will complete supine to/from SSP with min A OT Short Term Goal 2 (Week 3): Patient will complete SB transfer with mod A (with adequate lift to eliminate shearing) OT Short Term Goal 3 (Week 3): patient will complete UB dressing min A, LB dressing mod A  Skilled Therapeutic Interventions/Progress Updates:    Patient in bed, alert.  She is  refusing to get OOB or complete ADL tasks at this time.  Completed bilateral UB and LB A/AAROM.  Rolling in bed with CS today both right and left.  Patient in incontinent bowel episode requiring dependent change of brief and hygiene.  She completed oral care with set up, washed face with set up, max A for combing hair.  Repositioning in bed with mod a and cues.  Reviewed importance of side lying for skin care/pressure relief.  She practiced maintaining side lying position and alternating with CS.  She remained in bed at close of session, bed alarm set and call bell in reach.    Therapy Documentation Precautions:  Precautions Precautions: Fall Precaution Comments: Pt h/o confusion Required Braces or Orthoses: Other Brace Other Brace: AFO Restrictions Weight Bearing Restrictions: Yes RLE Weight Bearing: Touchdown weight bearing Other Position/Activity Restrictions: RLE Pain: Pain Assessment Pain Scale: 0-10 Pain Score: 0-No pain   Therapy/Group: Individual Therapy  Carlos Levering 10/10/2019, 11:38 AM
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
## 2019-10-11 ENCOUNTER — Inpatient Hospital Stay (HOSPITAL_COMMUNITY): Payer: BLUE CROSS/BLUE SHIELD | Admitting: Speech Pathology

## 2019-10-11 ENCOUNTER — Inpatient Hospital Stay (HOSPITAL_COMMUNITY): Payer: BLUE CROSS/BLUE SHIELD | Admitting: Occupational Therapy

## 2019-10-11 DIAGNOSIS — Z515 Encounter for palliative care: Secondary | ICD-10-CM

## 2019-10-11 DIAGNOSIS — Z7189 Other specified counseling: Secondary | ICD-10-CM

## 2019-10-11 DIAGNOSIS — Z66 Do not resuscitate: Secondary | ICD-10-CM

## 2019-10-11 LAB — GLUCOSE, CAPILLARY
Glucose-Capillary: 122 mg/dL — ABNORMAL HIGH (ref 70–99)
Glucose-Capillary: 120 mg/dL — ABNORMAL HIGH (ref 70–99)
Glucose-Capillary: 132 mg/dL — ABNORMAL HIGH (ref 70–99)
Glucose-Capillary: 164 mg/dL — ABNORMAL HIGH (ref 70–99)

## 2019-10-11 MED ORDER — OXYCODONE HCL ER 10 MG PO T12A
10.0000 mg | EXTENDED_RELEASE_TABLET | Freq: Two times a day (BID) | ORAL | Status: DC
  Administered 2019-10-11 – 2019-10-29 (×37): 10 mg via ORAL
  Filled 2019-10-11 (×37): qty 1

## 2019-10-11 MED ORDER — OXYCODONE HCL 5 MG PO TABS
5.0000 mg | ORAL_TABLET | ORAL | Status: DC | PRN
  Administered 2019-10-12 – 2019-10-19 (×2): 5 mg via ORAL
  Filled 2019-10-11 (×3): qty 1

## 2019-10-11 NOTE — Progress Notes (Signed)
Speech Language Pathology Daily Session Note  Patient Details  Name: HERMINIA WARREN MRN: [id number] Date of Birth: 04-Oct-1934  Today's Date: 10/11/2019 SLP Individual Time: 1410-1450 SLP Individual Time Calculation (min): 40 min  Short Term Goals: Week 3: SLP Short Term Goal 1 (Week 3): Pt will demonstrate sustained attention during functional tasks in 10 minute intervals with min A verbal cues for redirection. SLP Short Term Goal 2 (Week 3): Pt will demonstrate recall of novel, daily information with use of visual aids given mod A verbal and visual cues. SLP Short Term Goal 3 (Week 3): Pt will complete basic functional problem solving tasks with mod A verbal cues. SLP Short Term Goal 4 (Week 3): Pt will list 2 cognitive deficits and thier impact on function with mod A verbal cues.  Skilled Therapeutic Interventions: Skilled treatment session focused on cognitive goals. Upon arrival, patient was attempting to use the phone. Patient requested to call her husband and was able to locate the number in her room. Patient attempted to dial the number X 2, but was unable to locate the numbers on the keypad quick enough to call, therefore, SLP completed the task. Despite the phone ringing, the patient reported the call "didn't work" and hung up. Patient was perseverative on wanting her husband to be present, however, patient also reported he works in the afternoons. When asked to recall his hours, patient repeatedly started saying the months of the year. However, with Max A multimodal cues, patient reported her husband worked from [employer] . SLP reassured patient that he was currently still working and unable to answer the phone. Patient had an unknown visitor in her room prior to SLP arrival, however, patient unable to recall who it was or why he was visiting. Patient left upright in bed with alarm on and all needs within reach. Continue with current plan of care.      Pain No/Denies Pain   Therapy/Group:  Individual Therapy  PAYNE, COURTNEY 10/11/2019, 3:00 PM
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
## 2019-10-11 NOTE — Progress Notes (Signed)
Physical Therapy Weekly Progress Note  Patient Details  Name: Tonya Hunter MRN: [id number] Date of Birth: 1935/04/17  Beginning of progress report period: Oct 04, 2019 End of progress report period: Oct 11, 2019  Today's Date: 10/11/2019 PT Missed Time: 60 Minutes Missed Time Reason: Patient unwilling to participate  Patient has met 1 of 4 short term goals.  Pt very slow in progress toward therapy goals secondary to continued issues with nausea/vomiting as well as frequent attempts to refuse therapy. When pt has been agreeable to therapy she demonstrates good trunk control and is able to perform bed mobility with minA, and slideboard or squat pivot transfers with mod to maxA, depending on pt effort and cognitive status during session. Pt has had fluctuating levels of cognitive impairment and appears to be more intact following paracentesis, which she has received several times. Pt is still very limited in what she is able to participate in OOB secondary to comorbidities, WB status, and lack of motivation. Pt discharge disposition may depend on medical status but currently she is not mobilizing at level where husband could safely take her home without additional support.  Patient continues to demonstrate the following deficits muscle weakness, decreased cardiorespiratoy endurance, decreased initiation, decreased attention, decreased awareness, decreased problem solving, decreased safety awareness, decreased memory and delayed processing and decreased sitting balance, decreased standing balance, decreased postural control, decreased balance strategies and difficulty maintaining precautions and therefore will continue to benefit from skilled PT intervention to increase functional independence with mobility.  Patient progressing toward long term goals..  Continue plan of care.   Pt misses 60 minutes of Physical therapy due to refusing, saying "I just need to rest because I just got into a comfortable  position and now is not the time." PT encourages pt but pt adamantly refuses. Will follow up as appropriate.  PT Short Term Goals Week 2:  PT Short Term Goal 2 (Week 2): Pt will perform supine<>sit with modA PT Short Term Goal 2 - Progress (Week 2): Met PT Short Term Goal 3 (Week 2): Pt will perform slideboard transfer with modA. PT Short Term Goal 3 - Progress (Week 2): Progressing toward goal PT Short Term Goal 4 (Week 2): Pt will perform sit to stand transfer with modA. PT Short Term Goal 4 - Progress (Week 2): Progressing toward goal PT Short Term Goal 5 (Week 2): Pt will propel WC 25' with minA. PT Short Term Goal 5 - Progress (Week 2): Not progressing Week 3:  PT Short Term Goal 1 (Week 3): Pt will perform bed mobility with CGA. PT Short Term Goal 2 (Week 3): Pt will perform stand pivot transfer with modA. PT Short Term Goal 3 (Week 3): Pt will perform sit to stand with modA.  Skilled Therapeutic Interventions/Progress Updates:  Ambulation/gait training;Community reintegration;DME/adaptive equipment instruction;Neuromuscular re-education;Psychosocial support;Stair training;UE/LE Strength taining/ROM;Wheelchair propulsion/positioning;Balance/vestibular training;Discharge planning;Functional electrical stimulation;Pain management;Skin care/wound management;Therapeutic Activities;UE/LE Coordination activities;Cognitive remediation/compensation;Disease management/prevention;Functional mobility training;Patient/family education;Splinting/orthotics;Therapeutic Exercise;Visual/perceptual remediation/compensation   Therapy Documentation Precautions:  Precautions Precautions: Fall Precaution Comments: Pt h/o confusion Required Braces or Orthoses: Other Brace Other Brace: AFO Restrictions Weight Bearing Restrictions: Yes RLE Weight Bearing: Touchdown weight bearing Other Position/Activity Restrictions: RLE   Therapy/Group: Individual Therapy  Breck Coons, PT, DPT 10/11/2019, 10:37 AM

## 2019-10-11 NOTE — Progress Notes (Signed)
Occupational Therapy Note  Patient Details  Name: Tonya Hunter MRN: [id number] Date of Birth: Jul 02, 1934  Pt declined participation in OT session secondary to nausea and vomiting earlier.  She missed 60 mins with nursing made aware as well.   Jesaiah Fabiano OTR/L 10/11/2019, 3:46 PM

## 2019-10-11 NOTE — Plan of Care (Signed)
Palliative care provider requested copy of living will referenced by patient. Will mention this to husband on return to floor this pm.

## 2019-10-12 ENCOUNTER — Inpatient Hospital Stay (HOSPITAL_COMMUNITY): Payer: BLUE CROSS/BLUE SHIELD | Admitting: Physical Therapy

## 2019-10-12 ENCOUNTER — Inpatient Hospital Stay (HOSPITAL_COMMUNITY): Payer: BLUE CROSS/BLUE SHIELD | Admitting: Occupational Therapy

## 2019-10-12 DIAGNOSIS — Z66 Do not resuscitate: Secondary | ICD-10-CM

## 2019-10-12 DIAGNOSIS — Z7189 Other specified counseling: Secondary | ICD-10-CM

## 2019-10-12 DIAGNOSIS — Z515 Encounter for palliative care: Secondary | ICD-10-CM | POA: Insufficient documentation

## 2019-10-12 DIAGNOSIS — R52 Pain, unspecified: Secondary | ICD-10-CM

## 2019-10-12 LAB — GLUCOSE, CAPILLARY
Glucose-Capillary: 133 mg/dL — ABNORMAL HIGH (ref 70–99)
Glucose-Capillary: 120 mg/dL — ABNORMAL HIGH (ref 70–99)
Glucose-Capillary: 112 mg/dL — ABNORMAL HIGH (ref 70–99)
Glucose-Capillary: 126 mg/dL — ABNORMAL HIGH (ref 70–99)

## 2019-10-12 NOTE — Progress Notes (Signed)
Physical Therapy Session Note  Patient Details  Name: Tonya Hunter MRN: [id number] Date of Birth: May 28, 1934  Today's Date: 10/12/2019 PT Missed Time: 44 Minutes Missed Time Reason: Patient fatigue;Patient unwilling to participate    Pt received supine in bed with HOB maximally elevated and pt appearing lethargic with heavy eyelids. Pt pleasantly greeted therapist and initially agreeable to participate in session. Then pt reports she wanted to try and eat therefore therapist assisted taking a few bites of pudding and once redirecting pt back to participating in therapy session she declined. She continues to make some inappropriate comments to the situation even stating "I can't seem to talk this morning." Despite encouragement, pt continues to decline therapy at this time requesting additional help to eat - NT notified and pt left in bed with needs in reach. Missed 60 minutes of skilled physical therapy.    Therapy/Group: Individual Therapy  Tawana Scale, PT, DPT 10/12/2019, 12:23 PM

## 2019-10-12 NOTE — Progress Notes (Signed)
Informed by NT, that pt's family is refusing to have a "black nurse" Husband stated that years ago, pt had "bad experience with black female nurse". Husband asked to see charge nurse. CN, who is also black, did not feel comfortable going into room, so contacted [facility]. AC spoke with pt and family at bedside. Decision was made to have a female staff go in to room with RN. Discussed plan with RN and NT.   Gerald Stabs, RN
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
## 2019-10-12 NOTE — Progress Notes (Signed)
Occupational Therapy Session Note  Patient Details  Name: Tonya Hunter MRN: [id number] Date of Birth: November 21, 1934  Today's Date: 10/12/2019 OT Individual Time: 0459-1368 OT Individual Time Calculation (min): 44 min   Patient missed 31 minutes of session due to refusal and increasing verbal agitation - will attempt to make up time later today Short Term Goals: Week 3:  OT Short Term Goal 1 (Week 3): Patient will complete supine to/from SSP with min A OT Short Term Goal 2 (Week 3): Patient will complete SB transfer with mod A (with adequate lift to eliminate shearing) OT Short Term Goal 3 (Week 3): patient will complete UB dressing min A, LB dressing mod A  Skilled Therapeutic Interventions/Progress Updates:    patient in bed, alert.  She denies pain in lying position.  Husband present for session.  She continues to decline all activities as offered, but moves to unsupported sitting position with gentle guidance/min A.   Her comments do not make sense to current circumstances.  She is able to feed herself with set up but has poor intake and declines other options.  She tolerated unsupported sitting at edge of bed with CS for 25 minutes.  She refused therapeutic activity/ROM etc.  She refused dressing and bathing but did wash her face when offered a wash cloth.  Returned to supine position with max A.  Ended session due to increasing verbal agitation and ongoing refusal to participate at this time.  Bed alarm set and callbell/phone in hand.    Therapy Documentation Precautions:  Precautions Precautions: Fall Precaution Comments: Pt h/o confusion Required Braces or Orthoses: Other Brace Other Brace: AFO Restrictions Weight Bearing Restrictions: Yes RLE Weight Bearing: Touchdown weight bearing Other Position/Activity Restrictions: RLE  Therapy/Group: Individual Therapy  Carlos Levering 10/12/2019, 7:29 AM

## 2019-10-13 ENCOUNTER — Inpatient Hospital Stay (HOSPITAL_COMMUNITY): Payer: BLUE CROSS/BLUE SHIELD | Admitting: Occupational Therapy

## 2019-10-13 ENCOUNTER — Inpatient Hospital Stay (HOSPITAL_COMMUNITY): Payer: BLUE CROSS/BLUE SHIELD

## 2019-10-13 LAB — GLUCOSE, CAPILLARY
Glucose-Capillary: 125 mg/dL — ABNORMAL HIGH (ref 70–99)
Glucose-Capillary: 175 mg/dL — ABNORMAL HIGH (ref 70–99)
Glucose-Capillary: 116 mg/dL — ABNORMAL HIGH (ref 70–99)
Glucose-Capillary: 167 mg/dL — ABNORMAL HIGH (ref 70–99)

## 2019-10-13 LAB — AMMONIA: Ammonia: 37 umol/L — ABNORMAL HIGH (ref 9–35)

## 2019-10-13 MED ORDER — TORSEMIDE 20 MG PO TABS
20.0000 mg | ORAL_TABLET | Freq: Every day | ORAL | Status: DC
  Administered 2019-10-13 – 2019-10-29 (×17): 20 mg via ORAL
  Filled 2019-10-13 (×17): qty 1
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
## 2019-10-13 NOTE — Progress Notes (Signed)
Physical Therapy Session Note  Patient Details  Name: Tonya Hunter MRN: [id number] Date of Birth: February 25, 1935  Today's Date: 10/13/2019 PT Missed Time: 62 Minutes Missed Time Reason: Patient fatigue;Patient unwilling to participate  Short Term Goals: Week 3:  PT Short Term Goal 1 (Week 3): Pt will perform bed mobility with CGA. PT Short Term Goal 2 (Week 3): Pt will perform stand pivot transfer with modA. PT Short Term Goal 3 (Week 3): Pt will perform sit to stand with modA.  Skilled Therapeutic Interventions/Progress Updates:    Pt sidelying in bed upon therapist arrival, pt reports "I thought you would come later, I do not want to get up right now." Pt declining OOB activity this session despite education and encouragement from therapist. Pt reports she wants to "stay right here and do my own exercises." Pt asking if therapist can come back later, this therapist will check back at later time if available. Pt missed 60 minutes of skilled therapy tx secondary to unwilling to participate.   Therapy Documentation Precautions:  Precautions Precautions: Fall Precaution Comments: Pt h/o confusion Required Braces or Orthoses: Other Brace Other Brace: AFO Restrictions Weight Bearing Restrictions: Yes RLE Weight Bearing: Non weight bearing Other Position/Activity Restrictions: RLE    Therapy/Group: Individual Therapy  Netta Corrigan, PT, DPT, CSRS 10/13/2019, 7:58 AM

## 2019-10-13 NOTE — Progress Notes (Signed)
Occupational Therapy Session Note  Patient Details  Name: Tonya Hunter MRN: [id number] Date of Birth: 11-25-1934  Today's Date: 10/13/2019 OT Individual Time: 3545-6256 OT Individual Time Calculation (min): 70 min    Short Term Goals: Week 3:  OT Short Term Goal 1 (Week 3): Patient will complete supine to/from SSP with min A OT Short Term Goal 2 (Week 3): Patient will complete SB transfer with mod A (with adequate lift to eliminate shearing) OT Short Term Goal 3 (Week 3): patient will complete UB dressing min A, LB dressing mod A  Skilled Therapeutic Interventions/Progress Updates:    Patient in bed, alert, oriented to self and place.  She continues with confusion regarding her situation.  She notes no pain in side lying.  Completed UB bathing with min A.  She is able to brush hair with set up.  Doff/donn hospital gown with min A.  She declines clothing or paper scrubs at this time.  She declined sitting edge of bed or transfer OOB.  With encouragement she completed both UB shoulder to elbow and LB hip to ankle A/AAROM - noted improved AROM with increased repetitions this session.  Completed rolling with CS, long sit position with min A briefly.  Reviewed weight shift and skin care.  She is able to position herself in sidelying at close of session, bed alarm set and call bell in reach.  Husband present at end of session.    Therapy Documentation Precautions:  Precautions Precautions: Fall Precaution Comments: Pt h/o confusion Required Braces or Orthoses: Other Brace Other Brace: AFO Restrictions Weight Bearing Restrictions: Yes RLE Weight Bearing: Non weight bearing Other Position/Activity Restrictions: RLE   Therapy/Group: Individual Therapy  Carlos Levering 10/13/2019, 7:41 AM

## 2019-10-14 ENCOUNTER — Inpatient Hospital Stay (HOSPITAL_COMMUNITY): Payer: BLUE CROSS/BLUE SHIELD | Admitting: Occupational Therapy

## 2019-10-14 ENCOUNTER — Inpatient Hospital Stay (HOSPITAL_COMMUNITY): Payer: BLUE CROSS/BLUE SHIELD

## 2019-10-14 ENCOUNTER — Inpatient Hospital Stay (HOSPITAL_COMMUNITY): Payer: BLUE CROSS/BLUE SHIELD | Admitting: Physical Therapy

## 2019-10-14 ENCOUNTER — Inpatient Hospital Stay (HOSPITAL_COMMUNITY): Payer: Medicare Other

## 2019-10-14 LAB — GLUCOSE, CAPILLARY
Glucose-Capillary: 136 mg/dL — ABNORMAL HIGH (ref 70–99)
Glucose-Capillary: 230 mg/dL — ABNORMAL HIGH (ref 70–99)
Glucose-Capillary: 116 mg/dL — ABNORMAL HIGH (ref 70–99)
Glucose-Capillary: 119 mg/dL — ABNORMAL HIGH (ref 70–99)

## 2019-10-14 LAB — BASIC METABOLIC PANEL
Anion gap: 9 (ref 5–15)
BUN: 24 mg/dL — ABNORMAL HIGH (ref 8–23)
CO2: 24 mmol/L (ref 22–32)
Calcium: 8.6 mg/dL — ABNORMAL LOW (ref 8.9–10.3)
Chloride: 101 mmol/L (ref 98–111)
Creatinine, Ser: 1.16 mg/dL — ABNORMAL HIGH (ref 0.44–1.00)
GFR calc Af Amer: 50 mL/min — ABNORMAL LOW (ref >60)
GFR calc non Af Amer: 44 mL/min — ABNORMAL LOW (ref >60)
Glucose, Bld: 131 mg/dL — ABNORMAL HIGH (ref 70–99)
Potassium: 3.6 mmol/L (ref 3.5–5.1)
Sodium: 134 mmol/L — ABNORMAL LOW (ref 135–145)

## 2019-10-14 LAB — CBC
HCT: 35.2 % — ABNORMAL LOW (ref 36.0–46.0)
Hemoglobin: 11.3 g/dL — ABNORMAL LOW (ref 12.0–15.0)
MCH: 32.4 pg (ref 26.0–34.0)
MCHC: 32.1 g/dL (ref 30.0–36.0)
MCV: 100.9 fL — ABNORMAL HIGH (ref 80.0–100.0)
Platelets: 155 10*3/uL (ref 150–400)
RBC: 3.49 MIL/uL — ABNORMAL LOW (ref 3.87–5.11)
RDW: 19 % — ABNORMAL HIGH (ref 11.5–15.5)
WBC: 3.9 10*3/uL — ABNORMAL LOW (ref 4.0–10.5)
nRBC: 0 % (ref 0.0–0.2)

## 2019-10-14 NOTE — Progress Notes (Signed)
Physical Therapy Session Note  Patient Details  Name: Tonya Hunter MRN: [id number] Date of Birth: 10-22-1934  Today's Date: 10/14/2019 PT Missed Time: 93 Minutes Missed Time Reason: Patient unwilling to participate  Pt in R sidelying upon arrival. She refuses activity 2/2 having important conversation w/ her husband. She did not elaborate further and refused bed level intervention as well. Pt did verbalize that she knows she needs to participate to get better but continued to decline. Assisted w/ repositioning pillow behind head, per her request. Missed 45 min of skilled PT 2/2 refusal.   Mervin Ramires K Arnisha Laffoon 10/14/2019, 10:12 AM

## 2019-10-14 NOTE — Plan of Care (Signed)
  Problem: RH Problem Solving Goal: LTG Patient will demonstrate problem solving for (SLP) Description: LTG:  Patient will demonstrate problem solving for basic/complex daily situations with cues  (SLP) Flowsheets (Taken 10/14/2019 1235) LTG Patient will demonstrate problem solving for: (downgarded due to lack of progress and continued medical decline) Maximal Assistance - Patient 25 - 49% Note: Downgraded due to lack of progress and continued medical decline   Problem: RH Memory Goal: LTG Patient will use memory compensatory aids to (SLP) Description: LTG:  Patient will use memory compensatory aids to recall biographical/new, daily complex information with cues (SLP) Flowsheets (Taken 10/14/2019 1235) LTG: Patient will use memory compensatory aids to (SLP): (downgraded due to lack of progress and continued medical decline) Maximal Assistance - Patient 25 - 49% Note: Downgraded due to lack of progress and continued medical decline   Problem: RH Awareness Goal: LTG: Patient will demonstrate awareness during functional activites type of (SLP) Description: LTG: Patient will demonstrate awareness during functional activites type of (SLP) Flowsheets (Taken 10/14/2019 1235) LTG: Patient will demonstrate awareness during cognitive/linguistic activities with assistance of (SLP): (downgraded due to lack of progress and continued medical decline) Maximal Assistance - Patient 25 - 49% Note: Downgraded due to lack of progress and continued medical decline   Problem: RH Attention Goal: LTG Patient will demonstrate this level of attention during functional activites (SLP) Description: LTG:  Patient will will demonstrate this level of attention during functional activites (SLP) Flowsheets (Taken 10/14/2019 1235) LTG: Patient will demonstrate this level of attention during cognitive/linguistic activities with assistance of (SLP): (downgraded due to lack of progress and continued medical decline) Maximal  Assistance - Patient 25 - 49% Number of minutes patient will demonstrate attention during cognitive/linguistic activities: 5 minutes Note: Downgraded due to lack of progress and continued medical delcine
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
## 2019-10-14 NOTE — Progress Notes (Signed)
Physical Therapy Session Note  Patient Details  Name: Tonya Hunter MRN: [id number] Date of Birth: 22-Jan-1935  Today's Date: 10/14/2019 PT Individual Time: 1330-1411 PT Individual Time Calculation (min): 41 min   and Today's Date: 10/14/2019 PT Missed Time: 19 Minutes Missed Time Reason: Patient unwilling to participate  Short Term Goals: Week 3:  PT Short Term Goal 1 (Week 3): Pt will perform bed mobility with CGA. PT Short Term Goal 2 (Week 3): Pt will perform stand pivot transfer with modA. PT Short Term Goal 3 (Week 3): Pt will perform sit to stand with modA.  Skilled Therapeutic Interventions/Progress Updates:    Pt supine in bed upon PT arrival, pt declines out of bed activity, "I just don't want to get out of bed." Agreeable to bed level exercises this session, denies pain. Pt noted to have soiled gown and pad from emesis earlier. Pt performed rolling with min assist in each direction in order to change pad underneath her. Therapist assisted pt to change into a clean gown while supine in bed. Pt tearful at times during session, repeating "I just wish I was better," therapist provided emotional support, encouragement and education for trying to get OOB. Pt continues to decline OOB activity. Therapist donned ace wraps to B LE s for edema management this session. Pt supine in bed performed LE therex for strength and ROM, 2 x 10 of each with cues for techniques - active assisted SLR, hip flexion, ankle pumps and sidelying clamshells. Pt reports incontinence, rolling x 2 in each direction with min assist and use of bedrails to change soiled brief, total assist for pericare, incontinent bowel and bladder. Therapist floated heels using pillows for better positioning and skin management. Pt continues to decline OOB activity, missed 19 minutes due to unwilling to participate. Pt left supine in bed at end of session with needs in reach and bed alarm set.   Therapy Documentation Precautions:   Precautions Precautions: Fall Precaution Comments: Pt h/o confusion Required Braces or Orthoses: Other Brace Other Brace: AFO Restrictions Weight Bearing Restrictions: Yes RLE Weight Bearing: Touchdown weight bearing Other Position/Activity Restrictions: RLE    Therapy/Group: Individual Therapy  Netta Corrigan, PT, DPT, CSRS 10/14/2019, 8:49 AM
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
## 2019-10-14 NOTE — Progress Notes (Signed)
Occupational Therapy Session Note  Patient Details  Name: Tonya Hunter MRN: [id number] Date of Birth: Jul 31, 1934  Today's Date: 10/14/2019 OT Individual Time: 9518-8416 OT Individual Time Calculation (min): 40 min missed 20 minutes due to nausea and vomiting t/o session   Short Term Goals: Week 3:  OT Short Term Goal 1 (Week 3): Patient will complete supine to/from SSP with min A OT Short Term Goal 2 (Week 3): Patient will complete SB transfer with mod A (with adequate lift to eliminate shearing) OT Short Term Goal 3 (Week 3): patient will complete UB dressing min A, LB dressing mod A  Skilled Therapeutic Interventions/Progress Updates:    patient in bed, has recently returned from xray - vomiting small to moderate amounts every 3-5 minutes.  Nursing aware.  She reports pain at sacrum relieved in side lying.  She is agreeable to washing and changing soiled hospital gown.  She is able to wash face with set up, requires mod A for upper body for thoroughness.  Max A to doff and donn hospital gown.  Dependent to change soiled brief - she had small bowel movement and passed urine - nursing aware.  She is able to roll in bed with encouragement.  Returned to side lying position and ended session 20 minutes early due to ongoing nausea and vomiting - she is able to reach basin and NT to check in.  Call bell, tray table in reach, bed alarm set .  Therapy Documentation Precautions:  Precautions Precautions: Fall Precaution Comments: Pt h/o confusion Required Braces or Orthoses: Other Brace Other Brace: AFO Restrictions Weight Bearing Restrictions: Yes RLE Weight Bearing: Touchdown weight bearing Other Position/Activity Restrictions: RLE  Therapy/Group: Individual Therapy  Carlos Levering 10/14/2019, 7:35 AM

## 2019-10-14 NOTE — Progress Notes (Signed)
Speech Language Pathology Daily Session Note  Patient Details  Name: Tonya Hunter MRN: [id number] Date of Birth: 21-Jul-1934  Today's Date: 10/14/2019 SLP Individual Time: 1132-1200 SLP Individual Time Calculation (min): 28 min  Short Term Goals: Week 3: SLP Short Term Goal 1 (Week 3): Pt will demonstrate sustained attention during functional tasks in 10 minute intervals with min A verbal cues for redirection. SLP Short Term Goal 2 (Week 3): Pt will demonstrate recall of novel, daily information with use of visual aids given mod A verbal and visual cues. SLP Short Term Goal 3 (Week 3): Pt will complete basic functional problem solving tasks with mod A verbal cues. SLP Short Term Goal 4 (Week 3): Pt will list 2 cognitive deficits and thier impact on function with mod A verbal cues.  Skilled Therapeutic Interventions: Skilled ST services focused on cognitive goals. Pt was laying in bed holding basin in hand and began to spit up when SLP entered room. SLP assisted pt to reposition in bed, provided ice chips and empty basin following each spit up episode. Pt was orientated to place and month, but refused to answer questions about situation, stating " ive already you that." Pt became teary and express frustration with nausea. SLP provided emotional support. SLP facilitated basic problem solving and recall with use of call bell and room phone, pt required max A verbal/visual cues to return demonstration with call bell and refused to return demonstration with using room phone. SLP downgraded all goals to max A due to lack of progress and continued medical decline. Pt was left with call bell within reach and bed alarm set.     Pain Pain Assessment Faces Pain Scale: (spitting up)  Therapy/Group: Individual Therapy  Ellison Rieth  [facility] 10/14/2019, 12:40 PM

## 2019-10-15 ENCOUNTER — Inpatient Hospital Stay (HOSPITAL_COMMUNITY): Payer: BLUE CROSS/BLUE SHIELD | Admitting: Occupational Therapy

## 2019-10-15 ENCOUNTER — Inpatient Hospital Stay (HOSPITAL_COMMUNITY): Payer: Medicare Other

## 2019-10-15 ENCOUNTER — Inpatient Hospital Stay (HOSPITAL_COMMUNITY): Payer: BLUE CROSS/BLUE SHIELD

## 2019-10-15 DIAGNOSIS — Z7189 Other specified counseling: Secondary | ICD-10-CM | POA: Insufficient documentation

## 2019-10-15 DIAGNOSIS — K7581 Nonalcoholic steatohepatitis (NASH): Secondary | ICD-10-CM

## 2019-10-15 LAB — COMPREHENSIVE METABOLIC PANEL
ALT: 26 U/L (ref 0–44)
AST: 40 U/L (ref 15–41)
Albumin: 2.4 g/dL — ABNORMAL LOW (ref 3.5–5.0)
Alkaline Phosphatase: 308 U/L — ABNORMAL HIGH (ref 38–126)
Anion gap: 11 (ref 5–15)
BUN: 25 mg/dL — ABNORMAL HIGH (ref 8–23)
CO2: 25 mmol/L (ref 22–32)
Calcium: 8.6 mg/dL — ABNORMAL LOW (ref 8.9–10.3)
Chloride: 100 mmol/L (ref 98–111)
Creatinine, Ser: 1.24 mg/dL — ABNORMAL HIGH (ref 0.44–1.00)
GFR calc Af Amer: 47 mL/min — ABNORMAL LOW (ref >60)
GFR calc non Af Amer: 40 mL/min — ABNORMAL LOW (ref >60)
Glucose, Bld: 147 mg/dL — ABNORMAL HIGH (ref 70–99)
Potassium: 3.7 mmol/L (ref 3.5–5.1)
Sodium: 136 mmol/L (ref 135–145)
Total Bilirubin: 2 mg/dL — ABNORMAL HIGH (ref 0.3–1.2)
Total Protein: 6.1 g/dL — ABNORMAL LOW (ref 6.5–8.1)

## 2019-10-15 LAB — PROTIME-INR
INR: 1.5 — ABNORMAL HIGH (ref 0.8–1.2)
Prothrombin Time: 17.4 seconds — ABNORMAL HIGH (ref 11.4–15.2)

## 2019-10-15 LAB — GLUCOSE, CAPILLARY
Glucose-Capillary: 128 mg/dL — ABNORMAL HIGH (ref 70–99)
Glucose-Capillary: 138 mg/dL — ABNORMAL HIGH (ref 70–99)
Glucose-Capillary: 100 mg/dL — ABNORMAL HIGH (ref 70–99)
Glucose-Capillary: 92 mg/dL (ref 70–99)

## 2019-10-15 MED ORDER — SPIRONOLACTONE 25 MG PO TABS
100.0000 mg | ORAL_TABLET | Freq: Every day | ORAL | Status: DC
  Administered 2019-10-16 – 2019-10-28 (×13): 100 mg via ORAL
  Filled 2019-10-15 (×14): qty 4

## 2019-10-15 MED ORDER — ALBUMIN HUMAN 25 % IV SOLN
50.0000 g | Freq: Once | INTRAVENOUS | Status: AC
  Administered 2019-10-16: 50 g via INTRAVENOUS
  Filled 2019-10-15: qty 200

## 2019-10-15 NOTE — Progress Notes (Signed)
Occupational Therapy Session Note  Patient Details  Name: Tonya Hunter MRN: [id number] Date of Birth: 05/03/1935  Today's Date: 10/15/2019 OT Individual Time: 8250-5397 OT Individual Time Calculation (min): 41 min    Short Term Goals: Week 3:  OT Short Term Goal 1 (Week 3): Patient will complete supine to/from SSP with min A OT Short Term Goal 2 (Week 3): Patient will complete SB transfer with mod A (with adequate lift to eliminate shearing) OT Short Term Goal 3 (Week 3): patient will complete UB dressing min A, LB dressing mod A  Skilled Therapeutic Interventions/Progress Updates:    Patient in bed, side lying, alert, tearful at times.  One vomiting episode this session of small amount.  Attempted to move to edge of bed but she resisted and then refused.  Assisted to reposition.  She requires min A to transition from right to left side lying positions.  Completed oral care and face washing with set up.  She declined changing clothing at this time.  She completed approx 15-20 reps each LE AAROM and UE AAROM activity.  She remained in bed at close of session, bed alarm set and call bell in reach.    Therapy Documentation Precautions:  Precautions Precautions: Fall Precaution Comments: Pt h/o confusion Required Braces or Orthoses: Other Brace Other Brace: AFO Restrictions Weight Bearing Restrictions: Yes RLE Weight Bearing: Touchdown weight bearing Other Position/Activity Restrictions: RLE   Therapy/Group: Individual Therapy  Carlos Levering 10/15/2019, 7:34 AM

## 2019-10-15 NOTE — Plan of Care (Signed)
°  Problem: Consults Goal: RH GENERAL PATIENT EDUCATION Description: See Patient Education module for education specifics. Outcome: Progressing Goal: Skin Care Protocol Initiated - if Braden Score 18 or less Description: If consults are not indicated, leave blank or document N/A Outcome: Progressing Goal: Nutrition Consult-if indicated Outcome: Progressing Goal: Diabetes Guidelines if Diabetic/Glucose > 140 Description: If diabetic or lab glucose is > 140 mg/dl - Initiate Diabetes/Hyperglycemia Guidelines & Document Interventions  Outcome: Progressing   Problem: RH BOWEL ELIMINATION Goal: RH STG MANAGE BOWEL WITH ASSISTANCE Description: STG Manage Bowel with mod Assistance. Outcome: Progressing Goal: RH STG MANAGE BOWEL W/MEDICATION W/ASSISTANCE Description: STG Manage Bowel with Medication with min Assistance. Outcome: Progressing   Problem: RH BLADDER ELIMINATION Goal: RH STG MANAGE BLADDER WITH ASSISTANCE Description: STG Manage Bladder With max Assistance Outcome: Progressing Goal: RH STG MANAGE BLADDER WITH MEDICATION WITH ASSISTANCE Description: STG Manage Bladder With Medication With min Assistance. Outcome: Progressing   Problem: RH SKIN INTEGRITY Goal: RH STG SKIN FREE OF INFECTION/BREAKDOWN Description: Skin to remain free from infection and additional breakdown while on rehab with mod assistance. Outcome: Progressing Goal: RH STG MAINTAIN SKIN INTEGRITY WITH ASSISTANCE Description: STG Maintain Skin Integrity With mod Assistance. Outcome: Progressing Goal: RH STG ABLE TO PERFORM INCISION/WOUND CARE W/ASSISTANCE Description: STG Able To Perform Incision/Wound Care With mod Assistance. Outcome: Progressing   Problem: RH SAFETY Goal: RH STG ADHERE TO SAFETY PRECAUTIONS W/ASSISTANCE/DEVICE Description: STG Adhere to Safety Precautions With mod Assistance and appropriate assistive Device. Outcome: Progressing Goal: RH STG DECREASED RISK OF FALL WITH  ASSISTANCE Description: STG Decreased Risk of Fall With mod Assistance. Outcome: Progressing   Problem: RH PAIN MANAGEMENT Goal: RH STG PAIN MANAGED AT OR BELOW PT'S PAIN GOAL Description: <4 on a 0-10 pain scale. Outcome: Progressing   Problem: RH KNOWLEDGE DEFICIT GENERAL Goal: RH STG INCREASE KNOWLEDGE OF SELF CARE AFTER HOSPITALIZATION Description: Patient and caregiver will demonstrate knowledge of medication management, weightbearing restrictions, and follow up care with the MD after discharge with min assist from [facility] staff. Outcome: Progressing

## 2019-10-15 NOTE — Progress Notes (Addendum)
Patient ID: Tonya Hunter, female   DOB: 08/08/1934, 85 y.o.   MRN: [id number]  Notified by a colleague that patient's daughter [name] left a message on the palliative office phone yesterday 5/31 requesting a call back.  I called [name], she was concerned about the outcome of various discussions regarding HCPOA for her mother, as there are currently advanced directive documents in the EMR from 2020 listing Chrys Racer ([name]'s daughter) and [name] as [place]. I informed [name] that when palliative care saw her mother on 5/29, we felt she had capacity at that time to make her own decisions. [name] states this is "probably a good thing", expressing she would rather not make any decisions given the dynamics between her parents.  I also discussed with [name] that everything she told me during our phone call on 5/29 matched up with what the patient told palliative care at the bedside later that same day. The patient also conveyed to palliative that she was not opposed to either of her daughters calling or visiting.   Thank you for allowing the Palliative Medicine Team to assist in the care of this patient.   Greater than 50%  of this time was spent counseling and coordinating care related to the above assessment and plan.  Total time: 35 minutes   Lavena Bullion, NP Palliative Medicine   Please contact Palliative Medicine Team phone at [phone] for questions and concerns.  For individual provider, see AMION
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
## 2019-10-15 NOTE — Progress Notes (Signed)
Physical Therapy Session Note  Patient Details  Name: Tonya Hunter MRN: [id number] Date of Birth: Jun 24, 1934  Today's Date: 10/15/2019 PT Missed Time: 44 Minutes Missed Time Reason: Patient unwilling to participate  Pt refuses attempt at mobility, saying "I just can't do it right now. I am terrified of falling and now is just not the time. I'll do it myself later." PT educates and encourages pt but pt continues to refuse. Will follow up as able.  Therapy Documentation Precautions:  Precautions Precautions: Fall Precaution Comments: Pt h/o confusion Required Braces or Orthoses: Other Brace Other Brace: AFO Restrictions Weight Bearing Restrictions: Yes RLE Weight Bearing: Touchdown weight bearing Other Position/Activity Restrictions: RLE   Therapy/Group: Individual Therapy  Breck Coons, PT, DPT 10/15/2019, 3:50 PM
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
## 2019-10-16 ENCOUNTER — Inpatient Hospital Stay (HOSPITAL_COMMUNITY): Payer: BLUE CROSS/BLUE SHIELD | Admitting: Occupational Therapy

## 2019-10-16 ENCOUNTER — Inpatient Hospital Stay (HOSPITAL_COMMUNITY): Payer: Medicare Other

## 2019-10-16 ENCOUNTER — Inpatient Hospital Stay (HOSPITAL_COMMUNITY): Payer: BLUE CROSS/BLUE SHIELD

## 2019-10-16 ENCOUNTER — Inpatient Hospital Stay (HOSPITAL_COMMUNITY): Payer: BLUE CROSS/BLUE SHIELD | Admitting: Speech Pathology

## 2019-10-16 HISTORY — PX: IR PARACENTESIS: IMG2679

## 2019-10-16 LAB — GLUCOSE, CAPILLARY
Glucose-Capillary: 95 mg/dL (ref 70–99)
Glucose-Capillary: 113 mg/dL — ABNORMAL HIGH (ref 70–99)
Glucose-Capillary: 87 mg/dL (ref 70–99)
Glucose-Capillary: 104 mg/dL — ABNORMAL HIGH (ref 70–99)

## 2019-10-16 LAB — GRAM STAIN

## 2019-10-16 LAB — BODY FLUID CELL COUNT WITH DIFFERENTIAL
Eos, Fluid: 0 %
Lymphs, Fluid: 58 %
Monocyte-Macrophage-Serous Fluid: 33 % — ABNORMAL LOW (ref 50–90)
Neutrophil Count, Fluid: 9 % (ref 0–25)
Total Nucleated Cell Count, Fluid: 123 cu mm (ref 0–1000)

## 2019-10-16 LAB — ALBUMIN, PLEURAL OR PERITONEAL FLUID: Albumin, Fluid: <1 g/dL

## 2019-10-16 MED ORDER — LIDOCAINE HCL 1 % IJ SOLN
INTRAMUSCULAR | Status: AC
  Administered 2019-10-16: 10 mL
  Filled 2019-10-16: qty 20

## 2019-10-16 NOTE — Progress Notes (Signed)
Speech Language Pathology Weekly Progress Note  Patient Details  Name: Tonya Hunter MRN: [id number] Date of Birth: [birth year]  Beginning of progress report period: Oct 07, 2019 End of progress report period: October 16, 2019   Short Term Goals: Week 3: SLP Short Term Goal 1 (Week 3): Pt will demonstrate sustained attention during functional tasks in 10 minute intervals with min A verbal cues for redirection. SLP Short Term Goal 1 - Progress (Week 3): Not met SLP Short Term Goal 2 (Week 3): Pt will demonstrate recall of novel, daily information with use of visual aids given mod A verbal and visual cues. SLP Short Term Goal 2 - Progress (Week 3): Not met SLP Short Term Goal 3 (Week 3): Pt will complete basic functional problem solving tasks with mod A verbal cues. SLP Short Term Goal 3 - Progress (Week 3): Not met SLP Short Term Goal 4 (Week 3): Pt will list 2 cognitive deficits and thier impact on function with mod A verbal cues. SLP Short Term Goal 4 - Progress (Week 3): Not met    New Short Term Goals: Week 4: SLP Short Term Goal 1 (Week 4): STGs=LTGs due to ELOS  Weekly Progress Updates: Patient has made minimal gains and has not met any STGs this reporting period. Currently, patient requires overall Max-Total A to complete functional and familiar tasks safely in regards to attention, problem solving, recall with use of strategies and awareness. Patient and family education is ongoing. Patient would benefit from continued skilled intervention to maximize her cognitive functioning and overall functional independence prior to discharge.      Intensity: Minumum of 1-2 x/day, 30 to 90 minutes Frequency: 3 to 5 out of 7 days Duration/Length of Stay: TBD due to SNF placement Treatment/Interventions: Cognitive remediation/compensation;Cueing hierarchy;Functional tasks;Medication managment;Patient/family education;Internal/external aids;Therapeutic Activities;Environmental  controls     [place], [place] 10/16/2019, 1:30 PM

## 2019-10-16 NOTE — Progress Notes (Signed)
Subjective: Patient complains of nausea and bilious vomiting today morning. She has not received a paracentesis which was ordered yesterday.  Objective: Vital signs in last 24 hours: Temp:  [97.9 F (36.6 C)-98 F (36.7 C)] 97.9 F (36.6 C) (06/02 0339) Pulse Rate:  [80-87] 80 (06/02 0339) Resp:  [14-19] 19 (06/02 0339) BP: (98-104)/(51-57) 99/56 (06/02 0339) SpO2:  [76 %-99 %] 99 % (06/02 0339) Weight:  [161 kg] 108 kg (06/02 0339) Weight change:  Last BM Date: 10/15/19  PE: Elderly, frail, in distress with an emesis bag at bedside which contains small amount of bilious fluid GENERAL: Awake, oriented to place ABDOMEN: Remains distended, softer than yesterday EXTREMITIES: Bilateral leg swelling, right worse than left, pedal edema-pitting  Lab Results: Results for orders placed or performed during the hospital encounter of 09/24/19 (from the past 48 hour(s))  Glucose, capillary     Status: Abnormal   Collection Time: 10/14/19 12:10 PM  Result Value Ref Range   Glucose-Capillary 230 (H) 70 - 99 mg/dL    Comment: Glucose reference range applies only to samples taken after fasting for at least 8 hours.  Glucose, capillary     Status: Abnormal   Collection Time: 10/14/19  4:29 PM  Result Value Ref Range   Glucose-Capillary 116 (H) 70 - 99 mg/dL    Comment: Glucose reference range applies only to samples taken after fasting for at least 8 hours.  Glucose, capillary     Status: Abnormal   Collection Time: 10/14/19  8:53 PM  Result Value Ref Range   Glucose-Capillary 119 (H) 70 - 99 mg/dL    Comment: Glucose reference range applies only to samples taken after fasting for at least 8 hours.  Glucose, capillary     Status: Abnormal   Collection Time: 10/15/19  6:12 AM  Result Value Ref Range   Glucose-Capillary 128 (H) 70 - 99 mg/dL    Comment: Glucose reference range applies only to samples taken after fasting for at least 8 hours.  Glucose, capillary     Status: Abnormal   Collection Time: 10/15/19 11:46 AM  Result Value Ref Range   Glucose-Capillary 138 (H) 70 - 99 mg/dL    Comment: Glucose reference range applies only to samples taken after fasting for at least 8 hours.  Comprehensive metabolic panel     Status: Abnormal   Collection Time: 10/15/19 12:23 PM  Result Value Ref Range   Sodium 136 135 - 145 mmol/L   Potassium 3.7 3.5 - 5.1 mmol/L   Chloride 100 98 - 111 mmol/L   CO2 25 22 - 32 mmol/L   Glucose, Bld 147 (H) 70 - 99 mg/dL    Comment: Glucose reference range applies only to samples taken after fasting for at least 8 hours.   BUN 25 (H) 8 - 23 mg/dL   Creatinine, Ser 1.24 (H) 0.44 - 1.00 mg/dL   Calcium 8.6 (L) 8.9 - 10.3 mg/dL   Total Protein 6.1 (L) 6.5 - 8.1 g/dL   Albumin 2.4 (L) 3.5 - 5.0 g/dL   AST 40 15 - 41 U/L   ALT 26 0 - 44 U/L   Alkaline Phosphatase 308 (H) 38 - 126 U/L   Total Bilirubin 2.0 (H) 0.3 - 1.2 mg/dL   GFR calc non Af Amer 40 (L) >60 mL/min   GFR calc Af Amer 47 (L) >60 mL/min   Anion gap 11 5 - 15    Comment: Performed at [facility]  [address], [place], [place] [address]  Protime-INR     Status: Abnormal   Collection Time: 10/15/19 12:23 PM  Result Value Ref Range   Prothrombin Time 17.4 (H) 11.4 - 15.2 seconds   INR 1.5 (H) 0.8 - 1.2    Comment: (NOTE) INR goal varies based on device and disease states. Performed at [facility], [place] [address], [place], [place] [address]   Glucose, capillary     Status: Abnormal   Collection Time: 10/15/19  4:40 PM  Result Value Ref Range   Glucose-Capillary 100 (H) 70 - 99 mg/dL    Comment: Glucose reference range applies only to samples taken after fasting for at least 8 hours.  Glucose, capillary     Status: None   Collection Time: 10/15/19  9:06 PM  Result Value Ref Range   Glucose-Capillary 92 70 - 99 mg/dL    Comment: Glucose reference range applies only to samples taken after fasting for at least 8 hours.  Glucose, capillary     Status: None    Collection Time: 10/16/19  6:20 AM  Result Value Ref Range   Glucose-Capillary 95 70 - 99 mg/dL    Comment: Glucose reference range applies only to samples taken after fasting for at least 8 hours.    Studies/Results: DG Tibia/Fibula Right  Result Date: 10/15/2019 CLINICAL DATA:  Fracture pain swelling EXAM: RIGHT TIBIA AND FIBULA - 2 VIEW COMPARISON:  Oct 14, 2019, Sep 20, 2019 FINDINGS: There is a partially visualized ORIF with plate screw fixation of comminuted distal femur fracture. The patient is status post right total knee arthroplasty with unchanged nondisplaced proximal tibial fractures. There is diffuse osteopenia. Prepatellar subcutaneous edema is noted. Scattered dense vascular calcifications are noted. IMPRESSION: No significant change in the distal femur and proximal tibial fractures. Electronically Signed   By: Prudencio Pair M.D.   On: 10/15/2019 22:47    Medications: I have reviewed the patient's current medications.  Assessment: Decompensated liver cirrhosis related to [employer] guided paracentesis has been ordered, with plans to remove as much fluid as possible and IV albumin to be given if more than 5 L fluid was removed Spironolactone was increased to 100 mg/day yesterday while continuing torsemide 20 mg p.o. daily BUN 25, creatinine 1.24, GFR 40 as of yesterday  Hepatic encephalopathy-continued on lactulose 40 g twice daily and Xifaxan 550 mg twice daily  Esophageal varices-grade 1 noted on EGD from 11/2017 Also noted on CAT scan on this admission Hemoglobin stable at 11.3, platelet 155  Meld sodium score 16  Plan: Poor prognosis, extremely frail patient Not a candidate for liver transplant due to age Not a candidate for TIPS due to history of hepatic encephalopathy  Due to impaired renal function, it will be challenging to increase does of diuretics or to control ascites, without frequent paracentesis.  Notes from palliative care noted.  I had a lengthy  discussion with the patient and the husband yesterday, that there is no treatment for cirrhosis, except for treating complications related to it, namely ascites, hepatic encephalopathy and esophageal varices.  Patient will likely benefit from home hospice.  Ronnette Juniper, MD 10/16/2019, 10:01 AM
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
## 2019-10-16 NOTE — Progress Notes (Signed)
Patient ID: Tonya Hunter, female   DOB: [birth year], 85 y.o.   MRN: [id number]  SW left message for pt husband [name] [phone]) requesting return call. Purpose of call is to provide updates from team conference, and discuss d/c plan.   [name], MSW, [place] Office: [phone] Cell: [phone] Fax: [phone]

## 2019-10-16 NOTE — Procedures (Signed)
PROCEDURE SUMMARY: Upon arrival to unit, patient was noted to have a right arm skin tear.  She has blood under her left fingernails and may have scratched her arm.  Abrasion was dressed prior to procedure.   Successful US guided paracentesis from right lateral abdomen.  Yielded 4.0 liters of yellow fluid.  No immediate complications.  Pt tolerated well.   Specimen was sent for labs.  EBL < 8m  KDocia BarrierPA-C 10/16/2019 1:14 PM

## 2019-10-16 NOTE — Progress Notes (Signed)
Films reviewed by Ortho trauma and patient cleared for WBAT on RLE
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
## 2019-10-16 NOTE — Progress Notes (Signed)
Physical Therapy Session Note  Patient Details  Name: Tonya Hunter MRN: [id number] Date of Birth: 02/27/1935  Today's Date: 10/16/2019 PT Missed Time: 13 Minutes Missed Time Reason: Patient unwilling to participate  Short Term Goals: Week 3:  PT Short Term Goal 1 (Week 3): Pt will perform bed mobility with CGA. PT Short Term Goal 2 (Week 3): Pt will perform stand pivot transfer with modA. PT Short Term Goal 3 (Week 3): Pt will perform sit to stand with modA.  Skilled Therapeutic Interventions/Progress Updates:     Pt received in R sidelying. PT encourage therapy and pt declines, saying, "We went over this yesterday. I'm just not going to this right now." Pt also report nausea and requesting bag for emesis. PT will follow up as able.  Therapy Documentation Precautions:  Precautions Precautions: Fall Precaution Comments: Pt h/o confusion Required Braces or Orthoses: Other Brace Other Brace: AFO Restrictions Weight Bearing Restrictions: Yes RLE Weight Bearing: Touchdown weight bearing Other Position/Activity Restrictions: RLE   Therapy/Group: Individual Therapy  Breck Coons, PT, DPT 10/16/2019, 2:39 PM

## 2019-10-16 NOTE — Progress Notes (Signed)
SLP Cancellation Note  Patient Details Name: Tonya Hunter MRN: [id number] DOB: 1935/01/29   Cancelled treatment:     Patient missed 30 minutes of skilled SLP intervention secondary to being off unit for a procedure.                                                                                                   [place], [place] 10/16/2019, 1:31 PM

## 2019-10-17 ENCOUNTER — Inpatient Hospital Stay (HOSPITAL_COMMUNITY): Payer: BLUE CROSS/BLUE SHIELD

## 2019-10-17 ENCOUNTER — Inpatient Hospital Stay (HOSPITAL_COMMUNITY): Payer: BLUE CROSS/BLUE SHIELD | Admitting: Occupational Therapy

## 2019-10-17 LAB — GLUCOSE, CAPILLARY
Glucose-Capillary: 110 mg/dL — ABNORMAL HIGH (ref 70–99)
Glucose-Capillary: 109 mg/dL — ABNORMAL HIGH (ref 70–99)
Glucose-Capillary: 125 mg/dL — ABNORMAL HIGH (ref 70–99)
Glucose-Capillary: 85 mg/dL (ref 70–99)

## 2019-10-17 LAB — CYTOLOGY - NON PAP

## 2019-10-17 MED ORDER — ONDANSETRON HCL 4 MG/2ML IJ SOLN
4.0000 mg | Freq: Four times a day (QID) | INTRAMUSCULAR | Status: AC
  Administered 2019-10-17 – 2019-10-18 (×4): 4 mg via INTRAVENOUS
  Filled 2019-10-17 (×6): qty 2

## 2019-10-17 NOTE — Progress Notes (Signed)
Subjective: Patient states the nausea and vomiting has slightly improved. She feels a little better after paracentesis.  Objective: Vital signs in last 24 hours: Temp:  [97.5 F (36.4 C)-97.9 F (36.6 C)] 97.9 F (36.6 C) (06/03 0321) Pulse Rate:  [75-83] 76 (06/03 0321) Resp:  [12-18] 18 (06/03 0321) BP: (86-103)/(51-65) 103/65 (06/03 0321) SpO2:  [95 %-98 %] 98 % (06/03 0321) Weight:  [107 kg] 107 kg (06/03 0500) Weight change: -1 kg Last BM Date: 10/16/19   PE: Frail, elderly, lying on bed, not in distress GENERAL: Mild pallor, mild icterus, awake, oriented to person and place ABDOMEN: Much softer after paracentesis, nontender, normoactive bowel sounds EXTREMITIES: Obvious deformity of right knee area, bipedal pitting edema  Lab Results: Results for orders placed or performed during the hospital encounter of 09/24/19 (from the past 48 hour(s))  Glucose, capillary     Status: Abnormal   Collection Time: 10/15/19 11:46 AM  Result Value Ref Range   Glucose-Capillary 138 (H) 70 - 99 mg/dL    Comment: Glucose reference range applies only to samples taken after fasting for at least 8 hours.  Comprehensive metabolic panel     Status: Abnormal   Collection Time: 10/15/19 12:23 PM  Result Value Ref Range   Sodium 136 135 - 145 mmol/L   Potassium 3.7 3.5 - 5.1 mmol/L   Chloride 100 98 - 111 mmol/L   CO2 25 22 - 32 mmol/L   Glucose, Bld 147 (H) 70 - 99 mg/dL    Comment: Glucose reference range applies only to samples taken after fasting for at least 8 hours.   BUN 25 (H) 8 - 23 mg/dL   Creatinine, Ser 1.24 (H) 0.44 - 1.00 mg/dL   Calcium 8.6 (L) 8.9 - 10.3 mg/dL   Total Protein 6.1 (L) 6.5 - 8.1 g/dL   Albumin 2.4 (L) 3.5 - 5.0 g/dL   AST 40 15 - 41 U/L   ALT 26 0 - 44 U/L   Alkaline Phosphatase 308 (H) 38 - 126 U/L   Total Bilirubin 2.0 (H) 0.3 - 1.2 mg/dL   GFR calc non Af Amer 40 (L) >60 mL/min   GFR calc Af Amer 47 (L) >60 mL/min   Anion gap 11 5 - 15    Comment:  Performed at [facility] [address] [place], [place] [address]  Protime-INR     Status: Abnormal   Collection Time: 10/15/19 12:23 PM  Result Value Ref Range   Prothrombin Time 17.4 (H) 11.4 - 15.2 seconds   INR 1.5 (H) 0.8 - 1.2    Comment: (NOTE) INR goal varies based on device and disease states. Performed at [facility], [place] [address] [place], [place] [address]   Glucose, capillary     Status: Abnormal   Collection Time: 10/15/19  4:40 PM  Result Value Ref Range   Glucose-Capillary 100 (H) 70 - 99 mg/dL    Comment: Glucose reference range applies only to samples taken after fasting for at least 8 hours.  Glucose, capillary     Status: None   Collection Time: 10/15/19  9:06 PM  Result Value Ref Range   Glucose-Capillary 92 70 - 99 mg/dL    Comment: Glucose reference range applies only to samples taken after fasting for at least 8 hours.  Glucose, capillary     Status: None   Collection Time: 10/16/19  6:20 AM  Result Value Ref Range   Glucose-Capillary 95 70 - 99  mg/dL    Comment: Glucose reference range applies only to samples taken after fasting for at least 8 hours.  Body fluid cell count with differential     Status: Abnormal   Collection Time: 10/16/19 11:52 AM  Result Value Ref Range   Fluid Type-FCT PERITONEAL FLD ABD     Comment: CORRECTED ON 06/02 AT 1204: PREVIOUSLY REPORTED AS ABDOMEN   Color, Fluid YELLOW (A) YELLOW   Appearance, Fluid HAZY (A) CLEAR   Total Nucleated Cell Count, Fluid 123 0 - 1,000 cu mm   Neutrophil Count, Fluid 9 0 - 25 %    Comment: MESOTHELIAL CELLS PRESENT   Lymphs, Fluid 58 %   Monocyte-Macrophage-Serous Fluid 33 (L) 50 - 90 %   Eos, Fluid 0 %    Comment: Performed at [facility] [address], [place], [place] [address]  Gram stain     Status: None   Collection Time: 10/16/19 11:52 AM   Specimen: Abdomen; Peritoneal Fluid  Result Value Ref Range   Specimen Description PERITONEAL FLUID    Special  Requests ABDOMEN    Gram Stain      WBC PRESENT,BOTH PMN AND MONONUCLEAR NO ORGANISMS SEEN CYTOSPIN SMEAR Performed at [facility], [place] [address] [place], [place] [address]    Report Status 10/16/2019 FINAL   Albumin, pleural or peritoneal fluid     Status: None   Collection Time: 10/16/19 11:52 AM  Result Value Ref Range   Albumin, Fluid <1.0 g/dL   Fluid Type-FALB PERITONEAL FLD ABD     Comment: Performed at [facility], [place] [address] [place], [place] [address] CORRECTED ON 06/02 AT 3235: PREVIOUSLY REPORTED AS ABDOMEN   Glucose, capillary     Status: Abnormal   Collection Time: 10/16/19 12:09 PM  Result Value Ref Range   Glucose-Capillary 113 (H) 70 - 99 mg/dL    Comment: Glucose reference range applies only to samples taken after fasting for at least 8 hours.  Glucose, capillary     Status: None   Collection Time: 10/16/19  4:32 PM  Result Value Ref Range   Glucose-Capillary 87 70 - 99 mg/dL    Comment: Glucose reference range applies only to samples taken after fasting for at least 8 hours.  Glucose, capillary     Status: Abnormal   Collection Time: 10/16/19  9:03 PM  Result Value Ref Range   Glucose-Capillary 104 (H) 70 - 99 mg/dL    Comment: Glucose reference range applies only to samples taken after fasting for at least 8 hours.  Glucose, capillary     Status: Abnormal   Collection Time: 10/17/19  5:50 AM  Result Value Ref Range   Glucose-Capillary 110 (H) 70 - 99 mg/dL    Comment: Glucose reference range applies only to samples taken after fasting for at least 8 hours.    Studies/Results: DG Tibia/Fibula Right  Result Date: 10/15/2019 CLINICAL DATA:  Fracture pain swelling EXAM: RIGHT TIBIA AND FIBULA - 2 VIEW COMPARISON:  Oct 14, 2019, Sep 20, 2019 FINDINGS: There is a partially visualized ORIF with plate screw fixation of comminuted distal femur fracture. The patient is status post right total knee arthroplasty with unchanged nondisplaced proximal  tibial fractures. There is diffuse osteopenia. Prepatellar subcutaneous edema is noted. Scattered dense vascular calcifications are noted. IMPRESSION: No significant change in the distal femur and proximal tibial fractures. Electronically Signed   By: Prudencio Pair M.D.   On: 10/15/2019 22:47  IR Paracentesis  Result Date: 10/16/2019 INDICATION: Patient with history of cirrhosis, recurrent ascites. Request is made for diagnostic and therapeutic paracentesis. EXAM: ULTRASOUND GUIDED DIAGNOSTIC AND THERAPEUTIC PARACENTESIS MEDICATIONS: 10 mL 1% lidocaine COMPLICATIONS: None immediate. PROCEDURE: Informed written consent was obtained from the patient after a discussion of the risks, benefits and alternatives to treatment. A timeout was performed prior to the initiation of the procedure. Initial ultrasound scanning demonstrates a large amount of ascites within the right lower abdominal quadrant. The right lower abdomen was prepped and draped in the usual sterile fashion. 1% lidocaine was used for local anesthesia. Following this, a 19 gauge, 7-cm, Yueh catheter was introduced. An ultrasound image was saved for documentation purposes. The paracentesis was performed. The catheter was removed and a dressing was applied. The patient tolerated the procedure well without immediate post procedural complication. FINDINGS: A total of approximately 4.0 liters of yellow fluid was removed. Samples were sent to the laboratory as requested by the clinical team. IMPRESSION: Successful ultrasound-guided diagnostic and therapeutic paracentesis yielding 4.0 liters of peritoneal fluid. Read by: Brynda Greathouse PA-C Electronically Signed   By: Lucrezia Europe M.D.   On: 10/16/2019 13:18    Medications: I have reviewed the patient's current medications.  Assessment: Decompensated liver cirrhosis Ascites, 4 L paracentesis performed yesterday, no SBP, IV albumin 50 g x 1 dose given, continued on torsemide 20 mg daily and spironolactone  100 mg daily Impaired renal function History of hepatic encephalopathy-continued on lactulose and Xifaxan History of esophageal varices  Multiple comorbidities-right distal femur fracture, status post ORIF on 09/12/2019, CAD, CHF, TIA, stroke, diabetes Ongoing nausea and vomiting  Plan: Patient has been refusing physical therapy citing weakness nausea and vomiting She is on Compazine as needed, will start on Zofran 4 mg IV every 6 hours around-the-clock for 2 days.  Overall very poor prognosis Not a candidate for liver transplant, not a candidate for TIPS. Recommend home hospice.  Tonya Juniper, MD 10/17/2019, 8:07 AM

## 2019-10-17 NOTE — Progress Notes (Signed)
Occupational Therapy Session Note  Patient Details  Name: Tonya Hunter MRN: [id number] Date of Birth: [birth year]  Today's Date: 10/17/2019 OT Individual Time: 5300-5110 OT Individual Time Calculation (min): 11 min    Short Term Goals: Week 3:  OT Short Term Goal 1 (Week 3): Patient will complete supine to/from SSP with min A OT Short Term Goal 2 (Week 3): Patient will complete SB transfer with mod A (with adequate lift to eliminate shearing) OT Short Term Goal 3 (Week 3): patient will complete UB dressing min A, LB dressing mod A  Skilled Therapeutic Interventions/Progress Updates:    Patient in bed, husband present.  She is in sidelying position, alert.  Attempted to engage patient in self care, change of position and mobility tasks - she declined stating that she was "done for today"  Provided rationale and encouragement for sitting edge of bed and gentle guidance but she was resistant.  She would complete right LE and UE AROM/AAROM but with limited participation so ended session due to ongoing refusal.  Will attempt to make up missed time later today.  She remained in bed with bed alarm set and call bell in reach.    Therapy Documentation Precautions:  Precautions Precautions: Fall Precaution Comments: Pt h/o confusion Required Braces or Orthoses: Other Brace Other Brace: AFO Restrictions Weight Bearing Restrictions: No RLE Weight Bearing: Weight bearing as tolerated Other Position/Activity Restrictions: RLE General: General OT Amount of Missed Time: 19 Minutes   Therapy/Group: Individual Therapy  Carlos Levering 10/17/2019, 12:30 PM

## 2019-10-17 NOTE — Progress Notes (Signed)
Called pharmacy 2x  re: zofran not verified ; gave 1 dose prn this am

## 2019-10-17 NOTE — Progress Notes (Signed)
Occupational Therapy Weekly Progress Note  Patient Details  Name: Tonya Hunter MRN: [id number] Date of Birth: 1934-11-23  Beginning of progress report period: Oct 10, 2019 End of progress report period: October 17, 2019  Today's Date: 10/17/2019 OT Individual Time: 1330-1400 OT Individual Time Calculation (min): 30 min   Patient in bed, alert.  She states "I told you not to come back"  She denies pain at this time.  Reviewed importance of getting OOB and changing position but she is resistant to sitting edge of bed or transfer at this time.  She requires max A to change soiled brief.  She is able to roll in bed independently with bed flat.  She completed oral care and washing face, brushing hair with set up and encouragement.  She remained in bed at close of session with bed alarm set and call bell in hand.   Patient has met 0 of 4 short term goals.  Patient with ongoing refusal/resistance to participate in therapeutic activities.  She demonstrates physical ability to complete tasks but limited participation impacts progress.    Patient continues to demonstrate the following deficits: muscle weakness, decreased cardiorespiratoy endurance and decreased standing balance and decreased postural control and therefore will continue to benefit from skilled OT intervention to enhance overall performance with BADL, iADL and Reduce care partner burden.  Patient not progressing toward long term goals.  See goal revision..  Continue plan of care.  OT Short Term Goals Week 3:  OT Short Term Goal 1 (Week 3): Patient will complete supine to/from SSP with min A OT Short Term Goal 1 - Progress (Week 3): Not met OT Short Term Goal 2 (Week 3): Patient will complete SB transfer with mod A (with adequate lift to eliminate shearing) OT Short Term Goal 2 - Progress (Week 3): Not met OT Short Term Goal 3 (Week 3): patient will complete UB dressing min A, LB dressing mod A OT Short Term Goal 3 - Progress (Week 3): Not  met Week 4:  OT Short Term Goal 1 (Week 4): patient will complete supine to/from sitting postion edge of bed with min/mod A and tolerate unsupported sitting fo 10 minutes daily OT Short Term Goal 2 (Week 4): patient will complete sit pivot transfers bed to/from w/c with mod/max A OT Short Term Goal 3 (Week 4): patient will complete UB dressing with min A  Skilled Therapeutic Interventions/Progress Updates:      Therapy Documentation Precautions:  Precautions Precautions: Fall Precaution Comments: Pt h/o confusion Required Braces or Orthoses: Other Brace Other Brace: AFO Restrictions Weight Bearing Restrictions: No RLE Weight Bearing: Weight bearing as tolerated Other Position/Activity Restrictions: RLE   Therapy/Group: Individual Therapy  Carlos Levering 10/17/2019, 3:50 PM
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
## 2019-10-17 NOTE — Progress Notes (Signed)
Occupational Therapy Session Note MAKEUP SESSION  Patient Details  Name: Tonya Hunter MRN: [id number] Date of Birth: 1934/05/19  Today's Date: 10/17/2019 OT Missed Time: 11 Minutes Missed Time Reason: Patient unwilling/refused to participate without medical reason   Short Term Goals: Week 1:  OT Short Term Goal 1 (Week 1): Patient will thread BLE into pants with Mod A and use of AE/DME as needed. OT Short Term Goal 1 - Progress (Week 1): Progressing toward goal OT Short Term Goal 2 (Week 1): Patient will complete SB transfer with 1 person assist. OT Short Term Goal 2 - Progress (Week 1): Met OT Short Term Goal 3 (Week 1): Patient will complete toilet transfer to [facility] with SB and 1 person assist with adherence to RLE weightbearing precautions. OT Short Term Goal 3 - Progress (Week 1): Progressing toward goal OT Short Term Goal 4 (Week 1): Patient will complete 1/3 parts of toileting with 1 person assist. OT Short Term Goal 4 - Progress (Week 1): Progressing toward goal Week 2:  OT Short Term Goal 1 (Week 2): Patient will thread BLE into pants with Mod A and use of AE/DME as needed. OT Short Term Goal 1 - Progress (Week 2): Not met OT Short Term Goal 2 (Week 2): Patient will complete SB transfer with Mod A. OT Short Term Goal 2 - Progress (Week 2): Not met OT Short Term Goal 3 (Week 2): Patient will complete toilet transfer to [facility] with SB and 1 person assist with adherence to RLE weightbearing precautions. OT Short Term Goal 3 - Progress (Week 2): Not met OT Short Term Goal 4 (Week 2): Patient will complete 1/3 parts of toileting with 1 person assist. Week 3:  OT Short Term Goal 1 (Week 3): Patient will complete supine to/from SSP with min A OT Short Term Goal 2 (Week 3): Patient will complete SB transfer with mod A (with adequate lift to eliminate shearing) OT Short Term Goal 3 (Week 3): patient will complete UB dressing min A, LB dressing mod A  Skilled Therapeutic  Interventions/Progress Updates:    Pt received sidelying on R in bed and upon introducing myself, pt immediately stated "I just got back in bed and I am done. I will not be pushed to do anything more".  Tried multiple attempts to encourage movement, such as getting up to try to toilet or even sitting on EOB to take her medication. Pt continually refused, stating she can just lift her head off the pillow. Spoke with pt for over 10 minutes trying to encourage some participation but she was very focused on her fear of falling and she will just do things her way when she gets home.   Pt in bed with RN in the room.  Therapy Documentation Precautions:  Precautions Precautions: Fall Precaution Comments: Pt h/o confusion Required Braces or Orthoses: Other Brace Other Brace: AFO Restrictions Weight Bearing Restrictions: No RLE Weight Bearing: Weight bearing as tolerated Other Position/Activity Restrictions: RLE General:   Vital Signs:   Pain:   ADL: ADL Eating: Set up Where Assessed-Eating: Bed level Grooming: Setup Where Assessed-Grooming: Bed level Upper Body Bathing: Moderate assistance Where Assessed-Upper Body Bathing: Edge of bed Lower Body Bathing: Other (comment)(+2 helpers) Where Assessed-Lower Body Bathing: Bed level Upper Body Dressing: Moderate assistance Where Assessed-Upper Body Dressing: Edge of bed Lower Body Dressing: Dependent Where Assessed-Lower Body Dressing: Bed level Toileting: Dependent Where Assessed-Toileting: Bed level Toilet Transfer: Unable to assess Toilet Transfer Method: Unable to assess Tub/Shower  Transfer: Unable to assess Social research officer, government: Unable to assess ADL Comments: Patient currently Mod A with UB ADLs and +2 assist for LB ADLs at bed level   Therapy/Group: Individual Therapy  [place] 10/17/2019, 8:28 AM

## 2019-10-17 NOTE — Plan of Care (Signed)
  Problem: RH BLADDER ELIMINATION Goal: RH STG MANAGE BLADDER WITH ASSISTANCE Description: STG Manage Bladder With max Assistance Outcome: Not Progressing; pvr's ; I and O cath

## 2019-10-17 NOTE — Progress Notes (Signed)
Physical Therapy Session Note  Patient Details  Name: Tonya Hunter MRN: [id number] Date of Birth: Apr 08, 1935  Today's Date: 10/17/2019 PT Individual Time: 0803-0918 PT Individual Time Calculation (min): 75 min   Short Term Goals: Week 2:  PT Short Term Goal 2 (Week 2): Pt will perform supine<>sit with modA PT Short Term Goal 2 - Progress (Week 2): Met PT Short Term Goal 3 (Week 2): Pt will perform slideboard transfer with modA. PT Short Term Goal 3 - Progress (Week 2): Progressing toward goal PT Short Term Goal 4 (Week 2): Pt will perform sit to stand transfer with modA. PT Short Term Goal 4 - Progress (Week 2): Progressing toward goal PT Short Term Goal 5 (Week 2): Pt will propel WC 25' with minA. PT Short Term Goal 5 - Progress (Week 2): Not progressing  Skilled Therapeutic Interventions/Progress Updates:     Pt received supine in bed with MD present. MD informs pt that she must attempt to participate with therapy to progress mobility. Pt reluctantly agreeable to therapy. Reports pain in RLE. Number not provided. Supine to sit with supervision and verbal cues for positioning. Pt performs bed to [facility] transfer with modA and stand pivot technique. WC transport to therapy gym for energy conservation. Pt attempts nustep but reports pain in sacral area so activity discontinued. Multiple stand pivots throughout session with modA and facilitation at hips and trunk. Pt taken to // bars for standing balance and initiation of ambulation. Pt very anxious about falling but is able to perform multiple sit to stands with minA/modA and // bars. Pt remains standing with modA at hips and performs lateral weight shifts to initiate pre-gait training. Multiple extended seated rest breaks and extended time taken to calm pt due to extreme anxiety. Pt performs x4' ambulation with modA and manual assistance at hips as well as to progress RLE, facilitate lateral weight shifting and extension through hips and trunk. WC  transport back to room. ModA for stand pivot to bed and modA BLE management with return to supine. Pt in R sidelying with all needs within reach.  Therapy Documentation Precautions:  Precautions Precautions: Fall Precaution Comments: Pt h/o confusion Required Braces or Orthoses: Other Brace Other Brace: AFO Restrictions Weight Bearing Restrictions: No RLE Weight Bearing: Weight bearing as tolerated Other Position/Activity Restrictions: RLE   Therapy/Group: Individual Therapy  Breck Coons, PT, DPT 10/17/2019, 4:16 PM

## 2019-10-18 ENCOUNTER — Inpatient Hospital Stay (HOSPITAL_COMMUNITY): Payer: BLUE CROSS/BLUE SHIELD | Admitting: Occupational Therapy

## 2019-10-18 ENCOUNTER — Encounter (HOSPITAL_COMMUNITY): Payer: BLUE CROSS/BLUE SHIELD | Admitting: Psychology

## 2019-10-18 ENCOUNTER — Inpatient Hospital Stay (HOSPITAL_COMMUNITY): Payer: BLUE CROSS/BLUE SHIELD | Admitting: Physical Therapy

## 2019-10-18 ENCOUNTER — Inpatient Hospital Stay (HOSPITAL_COMMUNITY): Payer: BLUE CROSS/BLUE SHIELD

## 2019-10-18 LAB — COMPREHENSIVE METABOLIC PANEL
ALT: 24 U/L (ref 0–44)
AST: 42 U/L — ABNORMAL HIGH (ref 15–41)
Albumin: 2.7 g/dL — ABNORMAL LOW (ref 3.5–5.0)
Alkaline Phosphatase: 288 U/L — ABNORMAL HIGH (ref 38–126)
Anion gap: 10 (ref 5–15)
BUN: 23 mg/dL (ref 8–23)
CO2: 29 mmol/L (ref 22–32)
Calcium: 8.3 mg/dL — ABNORMAL LOW (ref 8.9–10.3)
Chloride: 96 mmol/L — ABNORMAL LOW (ref 98–111)
Creatinine, Ser: 1.29 mg/dL — ABNORMAL HIGH (ref 0.44–1.00)
GFR calc Af Amer: 44 mL/min — ABNORMAL LOW (ref >60)
GFR calc non Af Amer: 38 mL/min — ABNORMAL LOW (ref >60)
Glucose, Bld: 85 mg/dL (ref 70–99)
Potassium: 3.5 mmol/L (ref 3.5–5.1)
Sodium: 135 mmol/L (ref 135–145)
Total Bilirubin: 2.5 mg/dL — ABNORMAL HIGH (ref 0.3–1.2)
Total Protein: 5.7 g/dL — ABNORMAL LOW (ref 6.5–8.1)

## 2019-10-18 LAB — CBC WITH DIFFERENTIAL/PLATELET
Abs Immature Granulocytes: 0.01 10*3/uL (ref 0.00–0.07)
Basophils Absolute: 0 10*3/uL (ref 0.0–0.1)
Basophils Relative: 1 %
Eosinophils Absolute: 0.1 10*3/uL (ref 0.0–0.5)
Eosinophils Relative: 4 %
HCT: 36.2 % (ref 36.0–46.0)
Hemoglobin: 11.8 g/dL — ABNORMAL LOW (ref 12.0–15.0)
Immature Granulocytes: 0 %
Lymphocytes Relative: 29 %
Lymphs Abs: 1.2 10*3/uL (ref 0.7–4.0)
MCH: 32.7 pg (ref 26.0–34.0)
MCHC: 32.6 g/dL (ref 30.0–36.0)
MCV: 100.3 fL — ABNORMAL HIGH (ref 80.0–100.0)
Monocytes Absolute: 0.5 10*3/uL (ref 0.1–1.0)
Monocytes Relative: 14 %
Neutro Abs: 2.1 10*3/uL (ref 1.7–7.7)
Neutrophils Relative %: 52 %
Platelets: 173 10*3/uL (ref 150–400)
RBC: 3.61 MIL/uL — ABNORMAL LOW (ref 3.87–5.11)
RDW: 17.9 % — ABNORMAL HIGH (ref 11.5–15.5)
WBC: 3.9 10*3/uL — ABNORMAL LOW (ref 4.0–10.5)
nRBC: 0 % (ref 0.0–0.2)

## 2019-10-18 LAB — GLUCOSE, CAPILLARY
Glucose-Capillary: 49 mg/dL — ABNORMAL LOW (ref 70–99)
Glucose-Capillary: 96 mg/dL (ref 70–99)
Glucose-Capillary: 87 mg/dL (ref 70–99)
Glucose-Capillary: 125 mg/dL — ABNORMAL HIGH (ref 70–99)
Glucose-Capillary: 137 mg/dL — ABNORMAL HIGH (ref 70–99)
Glucose-Capillary: 132 mg/dL — ABNORMAL HIGH (ref 70–99)

## 2019-10-18 MED ORDER — INSULIN GLARGINE 100 UNIT/ML ~~LOC~~ SOLN
12.0000 [IU] | Freq: Every day | SUBCUTANEOUS | Status: DC
  Administered 2019-10-18 – 2019-10-29 (×12): 12 [IU] via SUBCUTANEOUS
  Filled 2019-10-18 (×14): qty 0.12

## 2019-10-18 MED ORDER — ENSURE MAX PROTEIN PO LIQD
11.0000 [oz_av] | Freq: Three times a day (TID) | ORAL | Status: DC
  Administered 2019-10-18 – 2019-10-29 (×24): 11 [oz_av] via ORAL
  Filled 2019-10-18 (×36): qty 330

## 2019-10-18 MED ORDER — BENEPROTEIN PO POWD
1.0000 | Freq: Three times a day (TID) | ORAL | Status: DC
  Administered 2019-10-18 – 2019-10-29 (×24): 6 g via ORAL
  Filled 2019-10-18 (×3): qty 227

## 2019-10-18 MED ORDER — INSULIN GLARGINE 100 UNIT/ML ~~LOC~~ SOLN: 12.0000 [IU] | Freq: Every day | SUBCUTANEOUS | Status: DC
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
## 2019-10-18 NOTE — Progress Notes (Signed)
Subjective: Lying on bed, covered in blankets, states she feels cold. It seems she participated in physical therapy yesterday.   Objective: Vital signs in last 24 hours: Temp:  [97.5 F (36.4 C)-97.6 F (36.4 C)] 97.6 F (36.4 C) (06/04 0539) Pulse Rate:  [78-81] 80 (06/04 0541) Resp:  [16-20] 16 (06/04 0539) BP: (99-115)/(52-63) 115/53 (06/04 0541) SpO2:  [96 %-98 %] 97 % (06/04 0541) Weight:  [107 kg] 107 kg (06/04 0444) Weight change: 0 kg Last BM Date: 10/17/19  PE: Elderly, frail, awake, oriented to person and time GENERAL: Mild pallor, mild icterus ABDOMEN: Remains distended but not tense, nontender EXTREMITIES: Improvement in bipedal pitting edema  Lab Results: Results for orders placed or performed during the hospital encounter of 09/24/19 (from the past 48 hour(s))  Body fluid cell count with differential     Status: Abnormal   Collection Time: 10/16/19 11:52 AM  Result Value Ref Range   Fluid Type-FCT PERITONEAL FLD ABD     Comment: CORRECTED ON 06/02 AT 1204: PREVIOUSLY REPORTED AS ABDOMEN   Color, Fluid YELLOW (A) YELLOW   Appearance, Fluid HAZY (A) CLEAR   Total Nucleated Cell Count, Fluid 123 0 - 1,000 cu mm   Neutrophil Count, Fluid 9 0 - 25 %    Comment: MESOTHELIAL CELLS PRESENT   Lymphs, Fluid 58 %   Monocyte-Macrophage-Serous Fluid 33 (L) 50 - 90 %   Eos, Fluid 0 %    Comment: Performed at [facility], [address] [place], [place] [address]  Gram stain     Status: None   Collection Time: 10/16/19 11:52 AM   Specimen: Abdomen; Peritoneal Fluid  Result Value Ref Range   Specimen Description PERITONEAL FLUID    Special Requests ABDOMEN    Gram Stain      WBC PRESENT,BOTH PMN AND MONONUCLEAR NO ORGANISMS SEEN CYTOSPIN SMEAR Performed at [facility], [place] [address] [place], [place] [address]    Report Status 10/16/2019 FINAL   Albumin, pleural or peritoneal fluid     Status: None   Collection Time: 10/16/19 11:52 AM  Result  Value Ref Range   Albumin, Fluid <1.0 g/dL   Fluid Type-FALB PERITONEAL FLD ABD     Comment: Performed at [facility], [place] [address] [place], [place] [address] CORRECTED ON 06/02 AT 1204: PREVIOUSLY REPORTED AS ABDOMEN   Culture, body fluid-bottle     Status: None (Preliminary result)   Collection Time: 10/16/19 11:52 AM   Specimen: Peritoneal Washings  Result Value Ref Range   Specimen Description PERITONEAL FLUID    Special Requests ABDOMEN    Culture      NO GROWTH < 24 HOURS Performed at [facility], [place] [address], [place], [place] [address]    Report Status PENDING   Cytology - Non PAP;     Status: None   Collection Time: 10/16/19 11:52 AM  Result Value Ref Range   CYTOLOGY - NON GYN      CYTOLOGY - NON PAP CASE: [id number] PATIENT: Tonya Hunter Non-Gynecological Cytology Report     Clinical History: None provided Specimen Submitted:  A. ASCITES, PARACENTESIS:   FINAL MICROSCOPIC DIAGNOSIS: - Reactive mesothelial cells present - Chronic inflammation  SPECIMEN ADEQUACY: Satisfactory for evaluation  GROSS: Received is/are 1000cc's of gold fluid.(GW:gw) Smears: 0 Concentration Method (Thin Prep): 1 Cell Block: 1 Conventional Additional Studies: N/A     Final Diagnosis performed by Vicente Males, MD.   Electronically signed  10/17/2019 Technical component performed at [employer]. [facility], [place] [address], [place], [place] [address].  Professional component performed at [facility] [place] [address], [place], [place] [address].  Immunohistochemistry Technical component (if applicable) was performed at [facility]. [address], [place], [place], [place] [address].   IMMUNOHISTOCHEMISTRY [place] (if applicable): Some of these immunohistochemical stains may have been developed and the performance characteristics determine by [facility]. Some may not have been cleared or approved  by the U.S. Food and Drug Administration. The FDA has determined that such clearance or approval is not necessary. This test is used for clinical purposes. It should not be regarded as investigational or for research. This laboratory is certified under the [place] (CLIA-88) as qualified to perform high complexity clinical laboratory testing.  The controls stained appropriately.   Glucose, capillary     Status: Abnormal   Collection Time: 10/16/19 12:09 PM  Result Value Ref Range   Glucose-Capillary 113 (H) 70 - 99 mg/dL    Comment: Glucose reference range applies only to samples taken after fasting for at least 8 hours.  Glucose, capillary     Status: None   Collection Time: 10/16/19  4:32 PM  Result Value Ref Range   Glucose-Capillary 87 70 - 99 mg/dL    Comment: Glucose reference range applies only to samples taken after fasting for at least 8 hours.  Glucose, capillary     Status: Abnormal   Collection Time: 10/16/19  9:03 PM  Result Value Ref Range   Glucose-Capillary 104 (H) 70 - 99 mg/dL    Comment: Glucose reference range applies only to samples taken after fasting for at least 8 hours.  Glucose, capillary     Status: Abnormal   Collection Time: 10/17/19  5:50 AM  Result Value Ref Range   Glucose-Capillary 110 (H) 70 - 99 mg/dL    Comment: Glucose reference range applies only to samples taken after fasting for at least 8 hours.  Glucose, capillary     Status: Abnormal   Collection Time: 10/17/19 11:46 AM  Result Value Ref Range   Glucose-Capillary 109 (H) 70 - 99 mg/dL    Comment: Glucose reference range applies only to samples taken after fasting for at least 8 hours.  Glucose, capillary     Status: Abnormal   Collection Time: 10/17/19  5:25 PM  Result Value Ref Range   Glucose-Capillary 125 (H) 70 - 99 mg/dL    Comment: Glucose reference range applies only to samples taken after fasting for at least 8 hours.  Glucose, capillary      Status: None   Collection Time: 10/17/19  9:06 PM  Result Value Ref Range   Glucose-Capillary 85 70 - 99 mg/dL    Comment: Glucose reference range applies only to samples taken after fasting for at least 8 hours.  Glucose, capillary     Status: Abnormal   Collection Time: 10/18/19  6:13 AM  Result Value Ref Range   Glucose-Capillary 49 (L) 70 - 99 mg/dL    Comment: Glucose reference range applies only to samples taken after fasting for at least 8 hours.  Comprehensive metabolic panel     Status: Abnormal   Collection Time: 10/18/19  6:21 AM  Result Value Ref Range   Sodium 135 135 - 145 mmol/L   Potassium 3.5 3.5 - 5.1 mmol/L   Chloride 96 (L) 98 - 111 mmol/L   CO2  29 22 - 32 mmol/L   Glucose, Bld 85 70 - 99 mg/dL    Comment: Glucose reference range applies only to samples taken after fasting for at least 8 hours.   BUN 23 8 - 23 mg/dL   Creatinine, Ser 1.29 (H) 0.44 - 1.00 mg/dL   Calcium 8.3 (L) 8.9 - 10.3 mg/dL   Total Protein 5.7 (L) 6.5 - 8.1 g/dL   Albumin 2.7 (L) 3.5 - 5.0 g/dL   AST 42 (H) 15 - 41 U/L   ALT 24 0 - 44 U/L   Alkaline Phosphatase 288 (H) 38 - 126 U/L   Total Bilirubin 2.5 (H) 0.3 - 1.2 mg/dL   GFR calc non Af Amer 38 (L) >60 mL/min   GFR calc Af Amer 44 (L) >60 mL/min   Anion gap 10 5 - 15    Comment: Performed at [facility] [address] [place], [place] [address]  CBC with Differential/Platelet     Status: Abnormal   Collection Time: 10/18/19  6:21 AM  Result Value Ref Range   WBC 3.9 (L) 4.0 - 10.5 K/uL   RBC 3.61 (L) 3.87 - 5.11 MIL/uL   Hemoglobin 11.8 (L) 12.0 - 15.0 g/dL   HCT 36.2 36.0 - 46.0 %   MCV 100.3 (H) 80.0 - 100.0 fL   MCH 32.7 26.0 - 34.0 pg   MCHC 32.6 30.0 - 36.0 g/dL   RDW 17.9 (H) 11.5 - 15.5 %   Platelets 173 150 - 400 K/uL   nRBC 0.0 0.0 - 0.2 %   Neutrophils Relative % 52 %   Neutro Abs 2.1 1.7 - 7.7 K/uL   Lymphocytes Relative 29 %   Lymphs Abs 1.2 0.7 - 4.0 K/uL   Monocytes Relative 14 %   Monocytes  Absolute 0.5 0.1 - 1.0 K/uL   Eosinophils Relative 4 %   Eosinophils Absolute 0.1 0.0 - 0.5 K/uL   Basophils Relative 1 %   Basophils Absolute 0.0 0.0 - 0.1 K/uL   Immature Granulocytes 0 %   Abs Immature Granulocytes 0.01 0.00 - 0.07 K/uL    Comment: Performed at [facility], [place] [address] [place], [place] [address]  Glucose, capillary     Status: None   Collection Time: 10/18/19  7:21 AM  Result Value Ref Range   Glucose-Capillary 96 70 - 99 mg/dL    Comment: Glucose reference range applies only to samples taken after fasting for at least 8 hours.    Studies/Results: IR Paracentesis  Result Date: 10/16/2019 INDICATION: Patient with history of cirrhosis, recurrent ascites. Request is made for diagnostic and therapeutic paracentesis. EXAM: ULTRASOUND GUIDED DIAGNOSTIC AND THERAPEUTIC PARACENTESIS MEDICATIONS: 10 mL 1% lidocaine COMPLICATIONS: None immediate. PROCEDURE: Informed written consent was obtained from the patient after a discussion of the risks, benefits and alternatives to treatment. A timeout was performed prior to the initiation of the procedure. Initial ultrasound scanning demonstrates a large amount of ascites within the right lower abdominal quadrant. The right lower abdomen was prepped and draped in the usual sterile fashion. 1% lidocaine was used for local anesthesia. Following this, a 19 gauge, 7-cm, Yueh catheter was introduced. An ultrasound image was saved for documentation purposes. The paracentesis was performed. The catheter was removed and a dressing was applied. The patient tolerated the procedure well without immediate post procedural complication. FINDINGS: A total of approximately 4.0 liters of yellow fluid was removed. Samples were sent to the laboratory as requested by the  clinical team. IMPRESSION: Successful ultrasound-guided diagnostic and therapeutic paracentesis yielding 4.0 liters of peritoneal fluid. Read by: Brynda Greathouse PA-C Electronically Signed    By: Lucrezia Europe M.D.   On: 10/16/2019 13:18    Medications: I have reviewed the patient's current medications.  Assessment: Decompensated liver cirrhosis Ascites History of hepatic encephalopathy History of esophageal varices  Plan: Patient on 100 mg of spironolactone daily and torsemide 20 mg daily, renal function although impaired has not worsened. There has been an improvement noted with pedal edema with diuretics and abdominal distention after paracentesis, recommend continuing same dosage of diuretics. Low albumin level of 2.7 and generalized deconditioning also likely contributing to pedal edema.  Recommend continuing lactulose and Xifaxan for history of hepatic encephalopathy  Overall patient's prognosis is poor She is not a candidate for liver transplant due to age and multiple comorbidity. She is not a candidate for TIPS for recurrent ascites due to history of hepatic encephalopathy  I have recommended home hospice after discharge with continuation of medical management for ascites and hepatic encephalopathy.  GI will sign off, please recall if needed.  Ronnette Juniper, MD 10/18/2019, 8:22 AM
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
## 2019-10-18 NOTE — Progress Notes (Signed)
Physical Therapy Session Note  Patient Details  Name: Tonya Hunter MRN: [id number] Date of Birth: May 11, 1935  Today's Date: 10/18/2019 PT Individual Time: 1448-1530 PT Individual Time Calculation (min): 42 min   Short Term Goals: Week 3:  PT Short Term Goal 1 (Week 3): Pt will perform bed mobility with CGA. PT Short Term Goal 2 (Week 3): Pt will perform stand pivot transfer with modA. PT Short Term Goal 3 (Week 3): Pt will perform sit to stand with modA.  Skilled Therapeutic Interventions/Progress Updates: Pt presents right side-lying and requires constant encouragement to participate w/ therapy.  Pt continues to state "later" and "not now", but encouraged to participate now.  Pt sat EOB w/ CGA and verbal cueing.  Pt then required encouragement to participate further, transferring to w/c.  Pt states doesn't want this PTs help, but stated will be here to maintain safety.  Pt required mod A for modified SPT w/ mod A.  Pt wheeled to gym and performed stand x 1 in // bars w/ mod A, but pt pushing self down into w/c.  Pt has no c/o pain w/ right LE.  Pt returned to bed w/ modified SPT w/c > bed w/ mod A and then transferred sit to supine w/ min to Mod A for LEs.  Pt remained right side-lying w/ pillow between knees for skin integrity, all needs in reach.     Therapy Documentation Precautions:  Precautions Precautions: Fall Precaution Comments: Pt h/o confusion Required Braces or Orthoses: Other Brace Other Brace: AFO Restrictions Weight Bearing Restrictions: Yes RLE Weight Bearing: Weight bearing as tolerated Other Position/Activity Restrictions: RLE General:   Vital Signs: Therapy Vitals Temp: 98.2 F (36.8 C) Temp Source: Oral Pulse Rate: 89 Resp: 16 BP: (!) 91/42 Patient Position (if appropriate): Lying Oxygen Therapy SpO2: 100 % O2 Device: Room Air Pain:  Pt c/o pain, but unable to quantify and does not wish to utilize pain meds.    Therapy/Group: Individual  Therapy  Ladoris Gene 10/18/2019, 4:00 PM

## 2019-10-18 NOTE — Progress Notes (Signed)
Occupational Therapy Session Note  Patient Details  Name: Tonya Hunter MRN: [id number] Date of Birth: 10/05/1934  Today's Date: 10/18/2019 OT Individual Time: 1345-1355 OT Individual Time Calculation (min): 10 min  20 minutes missed due to pt refusal to participate  Short Term Goals: Week 2:  OT Short Term Goal 1 (Week 2): Patient will thread BLE into pants with Mod A and use of AE/DME as needed. OT Short Term Goal 1 - Progress (Week 2): Not met OT Short Term Goal 2 (Week 2): Patient will complete SB transfer with Mod A. OT Short Term Goal 2 - Progress (Week 2): Not met OT Short Term Goal 3 (Week 2): Patient will complete toilet transfer to [facility] with SB and 1 person assist with adherence to RLE weightbearing precautions. OT Short Term Goal 3 - Progress (Week 2): Not met OT Short Term Goal 4 (Week 2): Patient will complete 1/3 parts of toileting with 1 person assist.  Skilled Therapeutic Interventions/Progress Updates:    Pt greeted in bed, stated her pain was better controlled today. Tried to establish report with pt and gently encourage participation in bedlevel or OOB self care tasks, suggesting several activities however pt refusing. Pt often stated: "honey, just stop talking," while exhibiting verbal agitation, requesting for OT to leave her alone and let her sleep. Reminded pt of her therapy goals however she continued to refuse. Left her with all needs within reach. Notified RN of pts refusal.   Therapy Documentation Precautions:  Precautions Precautions: Fall Precaution Comments: Pt h/o confusion Required Braces or Orthoses: Other Brace Other Brace: AFO Restrictions Weight Bearing Restrictions: Yes RLE Weight Bearing: Weight bearing as tolerated Other Position/Activity Restrictions: RLE Vital Signs: Therapy Vitals Temp: 98.2 F (36.8 C) Temp Source: Oral Pulse Rate: 89 Resp: 16 BP: (!) 91/42 Patient Position (if appropriate): Lying Oxygen Therapy SpO2: 100 % O2  Device: Room Air ADL:      Therapy/Group: Individual Therapy  [name] 10/18/2019, 4:16 PM

## 2019-10-18 NOTE — Progress Notes (Addendum)
Patient ID: Tonya Hunter, female   DOB: 26-Apr-1935, 85 y.o.   MRN: [id number]   Active NCPASRR #[id number]  if pt requires SNF.   SNF referral sent.   [name], MSW, [place] Office: [phone] Cell: [phone] Fax: [phone]
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
## 2019-10-18 NOTE — Progress Notes (Signed)
Physical Therapy Weekly Progress Note  Patient Details  Name: Tonya Hunter MRN: [id number] Date of Birth: 1934/07/20  Beginning of progress report period: Oct 11, 2019 End of progress report period: October 18, 2019  Today's Date: 10/18/2019 PT Missed Time: 60 Minutes Missed Time Reason: Patient unwilling to participate  Patient has met 1 of 3 short term goals.  Pt has continued to progress very slowly toward functional goals. Pt has demonstrated some improvement in functional mobility this week, improving independence with bed mobility and occasionally performing supine to sit with supervision assist. Pt has also improved OOB mobility and is now WBAT for RLE, so has initiated gait training in parallel bars. Pt continues, however, to refuse therapy more often than not, and always requires max encouragement to participate. When pt does participate she demonstrates good trunk control and relatively good strength, but anxiety and lack of motivation are limiting progress. Focus of coming week will include progressing OOB mobility and activity tolerance.  Patient continues to demonstrate the following deficits muscle weakness, decreased cardiorespiratoy endurance, decreased initiation, decreased attention, decreased awareness, decreased problem solving, decreased safety awareness, decreased memory and delayed processing and decreased sitting balance, decreased standing balance, decreased postural control and decreased balance strategies and therefore will continue to benefit from skilled PT intervention to increase functional independence with mobility.  Patient progressing toward long term goals..  Continue plan of care.  PT Short Term Goals Week 3:  PT Short Term Goal 1 (Week 3): Pt will perform bed mobility with CGA. PT Short Term Goal 1 - Progress (Week 3): Progressing toward goal PT Short Term Goal 2 (Week 3): Pt will perform stand pivot transfer with modA. PT Short Term Goal 2 - Progress (Week 3):  Progressing toward goal PT Short Term Goal 3 (Week 3): Pt will perform sit to stand with modA. PT Short Term Goal 3 - Progress (Week 3): Met Week 4:  PT Short Term Goal 1 (Week 4): Pt will perform bed mobility with CGA. PT Short Term Goal 2 (Week 4): Pt will perform stand pivot transfer with modA. PT Short Term Goal 3 (Week 4): Pt will ambulate 10' with modA.  Skilled Therapeutic Interventions/Progress Updates:     Pt received in R sidelying. Refuses attempt at mobilizing despite encouragement form therapist. Says she has already done enough for today, though she cannot verbalize any therapy that she has participated in. PT will follow up as appropriate.  Therapy Documentation Precautions:  Precautions Precautions: Fall Precaution Comments: Pt h/o confusion Required Braces or Orthoses: Other Brace Other Brace: AFO Restrictions Weight Bearing Restrictions: Yes RLE Weight Bearing: Weight bearing as tolerated Other Position/Activity Restrictions: RLE   Therapy/Group: Individual Therapy  Breck Coons, PT, DPT 10/18/2019, 4:51 PM

## 2019-10-19 ENCOUNTER — Inpatient Hospital Stay (HOSPITAL_COMMUNITY): Payer: BLUE CROSS/BLUE SHIELD | Admitting: Occupational Therapy

## 2019-10-19 ENCOUNTER — Inpatient Hospital Stay (HOSPITAL_COMMUNITY): Payer: BLUE CROSS/BLUE SHIELD | Admitting: Physical Therapy

## 2019-10-19 DIAGNOSIS — K7581 Nonalcoholic steatohepatitis (NASH): Secondary | ICD-10-CM

## 2019-10-19 DIAGNOSIS — S72401S Unspecified fracture of lower end of right femur, sequela: Secondary | ICD-10-CM

## 2019-10-19 DIAGNOSIS — E118 Type 2 diabetes mellitus with unspecified complications: Secondary | ICD-10-CM

## 2019-10-19 DIAGNOSIS — N179 Acute kidney failure, unspecified: Secondary | ICD-10-CM

## 2019-10-19 DIAGNOSIS — T1490XA Injury, unspecified, initial encounter: Secondary | ICD-10-CM

## 2019-10-19 DIAGNOSIS — I824Z3 Acute embolism and thrombosis of unspecified deep veins of distal lower extremity, bilateral: Secondary | ICD-10-CM

## 2019-10-19 DIAGNOSIS — R188 Other ascites: Secondary | ICD-10-CM

## 2019-10-19 LAB — GLUCOSE, CAPILLARY
Glucose-Capillary: 121 mg/dL — ABNORMAL HIGH (ref 70–99)
Glucose-Capillary: 123 mg/dL — ABNORMAL HIGH (ref 70–99)
Glucose-Capillary: 102 mg/dL — ABNORMAL HIGH (ref 70–99)
Glucose-Capillary: 200 mg/dL — ABNORMAL HIGH (ref 70–99)

## 2019-10-19 NOTE — Progress Notes (Addendum)
Physical Therapy Session Note  Patient Details  Name: Tonya Hunter MRN: [id number] Date of Birth: 10/03/1934  Today's Date: 10/19/2019 PT Individual Time: 1121-6244 PT Individual Time Calculation (min): 59 min  Missing time:  71' 2/2 refusal to participate. Short Term Goals: Week 3:  PT Short Term Goal 1 (Week 3): Pt will perform bed mobility with CGA. PT Short Term Goal 1 - Progress (Week 3): Progressing toward goal PT Short Term Goal 2 (Week 3): Pt will perform stand pivot transfer with modA. PT Short Term Goal 2 - Progress (Week 3): Progressing toward goal PT Short Term Goal 3 (Week 3): Pt will perform sit to stand with modA. PT Short Term Goal 3 - Progress (Week 3): Met  Skilled Therapeutic Interventions/Progress Updates: Pt presents sidelying in bed and requiring max encouragement and education to participate w/ therapy.  Pt becoming tearful, stating " I shouldn't have had to do what I had to do this AM".  Pt requires constant re-direction to task and finally agreed to participate w/ transfer to EOB.  Pt required increased time and supervision.  Pt then required increased encouragement to participate further.  Pt eventually performed lateral scoot/modified SPT bed > w/c w/ mod A, even though she states not wanting any help.  Pt encouraged to attempt w/c mobility but unable to push w/ left UE.  Pt wheeled to gym for time conservation.  Pt performed 2 x 10-15 LAQ, hip flexion (w/ manual cueing for correct performance.  Attempted to perform sit to stand in // bars, but constant encouragement produced no results.  Pt returned to room and performed lateral scoot w/c > bed w/ mod A and then sit to sidelying w/ min A for RLE.  Pt remained in bed w/ all needs in reach.     Therapy Documentation Precautions:  Precautions Precautions: Fall Precaution Comments: Pt h/o confusion Required Braces or Orthoses: Other Brace Other Brace: AFO Restrictions Weight Bearing Restrictions: Yes RLE Weight  Bearing: Weight bearing as tolerated Other Position/Activity Restrictions: RLE General: PT Amount of Missed Time (min): 16 Minutes PT Missed Treatment Reason: Patient unwilling to participate Vital Signs: Therapy Vitals Temp: 97.7 F (36.5 C) Pulse Rate: 79 Resp: 17 BP: 127/70 Patient Position (if appropriate): Lying Oxygen Therapy SpO2: 92 % O2 Device: Room Air Pain:  Pt c/o pain, appears to be right LE, but unable to quantify level.    Therapy/Group: Individual Therapy  Ladoris Gene 10/19/2019, 3:48 PM
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
## 2019-10-19 NOTE — Progress Notes (Signed)
Occupational Therapy Session Note  Patient Details  Name: Tonya Hunter MRN: [id number] Date of Birth: Sep 27, 1934  Today's Date: 10/19/2019 OT Individual Time:  - 740-830   40 minutes missed 10 minutes as she stated she was fatigued      Short Term Goals: Week 4:  OT Short Term Goal 1 (Week 4): patient will complete supine to/from sitting postion edge of bed with min/mod A and tolerate unsupported sitting fo 10 minutes daily OT Short Term Goal 2 (Week 4): patient will complete sit pivot transfers bed to/from w/c with mod/max A OT Short Term Goal 3 (Week 4): patient will complete UB dressing with min A  Skilled Therapeutic Interventions/Progress Updates: patient partially right side lying in bed.    Spoke words and subjects not oriented to situation or recognizable by this clinician.    After several minutes of attempting to build rapport and listen to patient speak, she concurred to sit on edge of bed and all of a sudden said, "well, I am wee weeing now."   When clinician assisted to help patient with front pericleansing, large wet stool was in brief.     Patient stated, "I am so sorry."  This clinicna attempted to reassure her and explain that she should not be sorry about the incontience and that this staff member would happy to help her clean up.   Otherwise, session as folows:  Front pericleansing with setup Brief cleaningo= total assist Patient able to complete several lateral rolls to ease caregiver burden.  She was so concerned about the incontinence that she could not focus to reach around to help with final cleansing of buttocks or help fasten brief.  Endurance activities supine in bed. However patient with short attention span.  Grooming of hair bedside sidelying in bed with setup  bedpositioning =max A.to help patient ease off bottom for pressure relief and she asked ot ly on her right side.   Patient was left sidelying with bed alarm engaged and husband walked in and started to  tidy up and walk busily around her room.  Continue OT POC      Therapy Documentation Precautions:  Precautions Precautions: Fall Precaution Comments: Pt h/o confusion Required Braces or Orthoses: Other Brace Other Brace: AFO Restrictions Weight Bearing Restrictions: Yes RLE Weight Bearing: Weight bearing as tolerated Other Position/Activity Restrictions: RLE General: General PT Missed Treatment Reason: Patient unwilling to participate Vital Signs:  Pain:   ADL: ADL Eating: Set up Where Assessed-Eating: Bed level Grooming: Setup Where Assessed-Grooming: Bed level Upper Body Bathing: Moderate assistance Where Assessed-Upper Body Bathing: Edge of bed Lower Body Bathing: Other (comment)(+2 helpers) Where Assessed-Lower Body Bathing: Bed level Upper Body Dressing: Moderate assistance Where Assessed-Upper Body Dressing: Edge of bed Lower Body Dressing: Dependent Where Assessed-Lower Body Dressing: Bed level Toileting: Dependent Where Assessed-Toileting: Bed level Toilet Transfer: Unable to assess Toilet Transfer Method: Unable to assess Tub/Shower Transfer: Unable to assess Social research officer, government: Unable to assess ADL Comments: Patient currently Mod A with UB ADLs and +2 assist for LB ADLs at bed level Vision   Perception    Praxis   Exercises:   Other Treatments:     Therapy/Group: Individual Therapy  Alfredia Ferguson [facility] 10/19/2019, 2:29 PM

## 2019-10-19 NOTE — Plan of Care (Signed)
°  Problem: RH BLADDER ELIMINATION Goal: RH STG MANAGE BLADDER WITH ASSISTANCE Description: STG Manage Bladder With max Assistance Outcome: Not Progressing; incontinence   Problem: RH SAFETY Goal: RH STG ADHERE TO SAFETY PRECAUTIONS W/ASSISTANCE/DEVICE Description: STG Adhere to Safety Precautions With mod Assistance and appropriate assistive Device. Outcome: Not Progressing; telesitter

## 2019-10-20 ENCOUNTER — Inpatient Hospital Stay (HOSPITAL_COMMUNITY): Payer: BLUE CROSS/BLUE SHIELD | Admitting: Occupational Therapy

## 2019-10-20 ENCOUNTER — Inpatient Hospital Stay (HOSPITAL_COMMUNITY): Payer: BLUE CROSS/BLUE SHIELD

## 2019-10-20 DIAGNOSIS — D72819 Decreased white blood cell count, unspecified: Secondary | ICD-10-CM

## 2019-10-20 DIAGNOSIS — R140 Abdominal distension (gaseous): Secondary | ICD-10-CM

## 2019-10-20 LAB — GLUCOSE, CAPILLARY
Glucose-Capillary: 128 mg/dL — ABNORMAL HIGH (ref 70–99)
Glucose-Capillary: 120 mg/dL — ABNORMAL HIGH (ref 70–99)
Glucose-Capillary: 114 mg/dL — ABNORMAL HIGH (ref 70–99)
Glucose-Capillary: 123 mg/dL — ABNORMAL HIGH (ref 70–99)
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
## 2019-10-20 NOTE — Plan of Care (Signed)
  Problem: RH BOWEL ELIMINATION Goal: RH STG MANAGE BOWEL WITH ASSISTANCE Description: STG Manage Bowel with mod Assistance. Outcome: Not Progressing; incontinence   Problem: RH BLADDER ELIMINATION Goal: RH STG MANAGE BLADDER WITH ASSISTANCE Description: STG Manage Bladder With max Assistance Outcome: Not Progressing; incontinence

## 2019-10-20 NOTE — Progress Notes (Signed)
Physical Therapy Session Note  Patient Details  Name: Tonya Hunter MRN: [id number] Date of Birth: 12/22/1934  Today's Date: 10/20/2019 PT Individual Time: 0918-1014 PT Individual Time Calculation (min): 56 min   Short Term Goals: Week 3:  PT Short Term Goal 1 (Week 3): Pt will perform bed mobility with CGA. PT Short Term Goal 1 - Progress (Week 3): Progressing toward goal PT Short Term Goal 2 (Week 3): Pt will perform stand pivot transfer with modA. PT Short Term Goal 2 - Progress (Week 3): Progressing toward goal PT Short Term Goal 3 (Week 3): Pt will perform sit to stand with modA. PT Short Term Goal 3 - Progress (Week 3): Met  Skilled Therapeutic Interventions/Progress Updates: Pt presents sidelying in bed, but appears in better spirits, agreeing to therapy.  Pt required threading of LEs into pants and then assisted to pull over knees.  Pt performed rolling bilateral sides w/ supervision to pull pants over hips.  Pt transferred right side-lying to sit on EOB w/ supervision and verbal cues.  Pt had intermittent episodes of emotion but self-limiting and then progressed to next task.  Pt performed modified stand pivot/ lateral scoot w/ mod A bed > w/c, and then straightens self to middle of seat.  Pt wheeled to gym for time conservation.  Pt c/o nausea and then had medium emesis, partially on front of self and into trash can, no emesis basin available.  Pt cleaned up w/ towels to avoid spillage and then returned to room.  Pt given warm wash clothes and sat in chair to clean face, neck and chest after removing gown.  New gown donned and then pt performed same transfer w/c > bed w/ mod A.  Pt transferred sit > right sidelying w/ min A for RLE.  Pt requests to remain in this position.  Bed alarm on and all needs in reach.  Nursing notified of emesis.     Therapy Documentation Precautions:  Precautions Precautions: Fall Precaution Comments: Pt h/o confusion Required Braces or Orthoses: Other  Brace Other Brace: AFO Restrictions Weight Bearing Restrictions: Yes RLE Weight Bearing: Weight bearing as tolerated Other Position/Activity Restrictions: RLE General:   Vital Signs:  Pain:  pt very emotional, stating pain to right LE and "tailbone" but unable to state level.       Therapy/Group: Individual Therapy   P  10/20/2019, 10:19 AM  

## 2019-10-20 NOTE — Progress Notes (Signed)
Occupational Therapy Session Note  Patient Details  Name: Tonya Hunter MRN: [id number] Date of Birth: 1935/05/01  Today's Date: 10/20/2019 OT Individual Time: 0600-4599 OT Individual Time Calculation (min): 9 min   Skilled Therapeutic Interventions/Progress Updates:    Pt greeted in bed with lunch tray on table. Strongly encouraged pt to eat, recommending sitting EOB or up in the chair. Pt adamantly refusing. Increased time for establishing rapport with pt, talking to her about her day. She was uncomfortable in bed, offered to help her with boosting up and repositioning on her side with props, as she c/o sacral pain. Pt stated "no, I am just fine where I am." Refused for OT to assess cleanliness of brief, or set her up for any bedlevel self care activity. Pt then became teary with additional encouragement for participation. She just wanted to be left alone to rest. Pt remained in bed at close of session with all needs within reach. Time missed due to pt refusal.   Therapy Documentation Precautions:  Precautions Precautions: Fall Precaution Comments: Pt h/o confusion Required Braces or Orthoses: Other Brace Other Brace: AFO Restrictions Weight Bearing Restrictions: Yes RLE Weight Bearing: Weight bearing as tolerated Other Position/Activity Restrictions: RLE Vital Signs: Therapy Vitals Temp: 98.2 F (36.8 C) Temp Source: Oral Pulse Rate: 86 Resp: 17 BP: 121/73 Patient Position (if appropriate): Lying Oxygen Therapy SpO2: 94 % O2 Device: Room Air ADL: ADL Eating: Set up Where Assessed-Eating: Bed level Grooming: Setup Where Assessed-Grooming: Bed level Upper Body Bathing: Moderate assistance Where Assessed-Upper Body Bathing: Edge of bed Lower Body Bathing: Other (comment)(+2 helpers) Where Assessed-Lower Body Bathing: Bed level Upper Body Dressing: Moderate assistance Where Assessed-Upper Body Dressing: Edge of bed Lower Body Dressing: Dependent Where Assessed-Lower Body  Dressing: Bed level Toileting: Dependent Where Assessed-Toileting: Bed level Toilet Transfer: Unable to assess Toilet Transfer Method: Unable to assess Tub/Shower Transfer: Unable to assess Social research officer, government: Unable to assess ADL Comments: Patient currently Mod A with UB ADLs and +2 assist for LB ADLs at bed level      Therapy/Group: Individual Therapy  Akif Weldy A Merna Baldi 10/20/2019, 3:42 PM

## 2019-10-21 ENCOUNTER — Inpatient Hospital Stay (HOSPITAL_COMMUNITY): Payer: BLUE CROSS/BLUE SHIELD

## 2019-10-21 ENCOUNTER — Inpatient Hospital Stay (HOSPITAL_COMMUNITY): Payer: BLUE CROSS/BLUE SHIELD | Admitting: Physical Therapy

## 2019-10-21 ENCOUNTER — Inpatient Hospital Stay (HOSPITAL_COMMUNITY): Payer: BLUE CROSS/BLUE SHIELD | Admitting: Speech Pathology

## 2019-10-21 DIAGNOSIS — R110 Nausea: Secondary | ICD-10-CM

## 2019-10-21 LAB — GLUCOSE, CAPILLARY
Glucose-Capillary: 152 mg/dL — ABNORMAL HIGH (ref 70–99)
Glucose-Capillary: 113 mg/dL — ABNORMAL HIGH (ref 70–99)
Glucose-Capillary: 106 mg/dL — ABNORMAL HIGH (ref 70–99)
Glucose-Capillary: 109 mg/dL — ABNORMAL HIGH (ref 70–99)

## 2019-10-21 LAB — CBC
HCT: 36.6 % (ref 36.0–46.0)
Hemoglobin: 11.9 g/dL — ABNORMAL LOW (ref 12.0–15.0)
MCH: 32.2 pg (ref 26.0–34.0)
MCHC: 32.5 g/dL (ref 30.0–36.0)
MCV: 99.2 fL (ref 80.0–100.0)
Platelets: 137 10*3/uL — ABNORMAL LOW (ref 150–400)
RBC: 3.69 MIL/uL — ABNORMAL LOW (ref 3.87–5.11)
RDW: 16.9 % — ABNORMAL HIGH (ref 11.5–15.5)
WBC: 3.9 10*3/uL — ABNORMAL LOW (ref 4.0–10.5)
nRBC: 0 % (ref 0.0–0.2)

## 2019-10-21 LAB — BASIC METABOLIC PANEL
Anion gap: 12 (ref 5–15)
BUN: 28 mg/dL — ABNORMAL HIGH (ref 8–23)
CO2: 26 mmol/L (ref 22–32)
Calcium: 8.5 mg/dL — ABNORMAL LOW (ref 8.9–10.3)
Chloride: 95 mmol/L — ABNORMAL LOW (ref 98–111)
Creatinine, Ser: 1.23 mg/dL — ABNORMAL HIGH (ref 0.44–1.00)
GFR calc Af Amer: 47 mL/min — ABNORMAL LOW (ref >60)
GFR calc non Af Amer: 41 mL/min — ABNORMAL LOW (ref >60)
Glucose, Bld: 141 mg/dL — ABNORMAL HIGH (ref 70–99)
Potassium: 3.5 mmol/L (ref 3.5–5.1)
Sodium: 133 mmol/L — ABNORMAL LOW (ref 135–145)

## 2019-10-21 LAB — CULTURE, BODY FLUID W GRAM STAIN -BOTTLE: Culture: NO GROWTH

## 2019-10-21 MED ORDER — ONDANSETRON HCL 4 MG/2ML IJ SOLN: 4.0000 mg | Freq: Four times a day (QID) | INTRAMUSCULAR | Status: DC
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
## 2019-10-21 NOTE — Progress Notes (Signed)
Physical Therapy Session Note  Patient Details  Name: Tonya Hunter MRN: [id number] Date of Birth: 05/27/1934  Today's Date: 10/21/2019 PT Individual Time: 1005-1046 and 1315-1330 PT Individual Time Calculation (min): 41 min and 15 min  Short Term Goals: Week 4:  PT Short Term Goal 1 (Week 4): Pt will perform bed mobility with CGA. PT Short Term Goal 2 (Week 4): Pt will perform stand pivot transfer with modA. PT Short Term Goal 3 (Week 4): Pt will ambulate 10' with modA.  Skilled Therapeutic Interventions/Progress Updates:     1st Session: Pt received supine in bed and agreeable to therapy. Reports some pain in BLEs but number not provided. PT provided repositioning and rest breaks as needed to address pain. Pt requests to perform bed mobility without help from PT, so PT only provides verbal cues for hand placement and sequencing and pt performs with supervision. Pt performs lateral transfer from bed>WC with modA. WC transport to therapy gym for energy conservation. Pt only wanting to sit in [facility] but eventually agreeable to attempting sit to stand transfer. Pt performs total of x3 sit to stand transfers in // bars with minA each rep. Pt stands 15 seconds first resp, 1 minute second rep, and 3-4 minutes on 3rd rep with PT providing min/modA at hips to assist hip and trunk extension. WC transport back to room. Pt left seated in WC with alarm intact and all needs within reach.  2nd session: Pt seated in WC, requesting to return to bed. PT attempts to encourage additional transfer and pre-gait training but pt refuses vociferously. Pt performs stand pivot transfer from [facility] to bed with modA and audible anxiety. Sit to supine with minA. Pt then performs bilateral rolling with supervision and PT and RN perform pericare and brief change due to incontinence of bowel and bladder. Pt left supine in bed with all needs within reach.   Therapy Documentation Precautions:  Precautions Precautions: Fall Precaution  Comments: Pt h/o confusion Required Braces or Orthoses: Other Brace Other Brace: AFO Restrictions Weight Bearing Restrictions: Yes RLE Weight Bearing: Weight bearing as tolerated Other Position/Activity Restrictions: RLE    Therapy/Group: Individual Therapy  Breck Coons, PT, DPT 10/21/2019, 1:52 PM

## 2019-10-21 NOTE — Progress Notes (Signed)
Physical Therapy Note  Patient Details  Name: Tonya Hunter MRN: [id number] Date of Birth: 1934/06/18 Today's Date: 10/21/2019    Patient received in bed with covers pulled over her head and also having pulled her hospital gown over her head too. Attempted to engage patient in low level therapies however she adamantly refuses and began to become angry when therapist pressed. 30 minutes of skilled PT services lost due to patient refusal.    Ann Lions PT, DPT, PN1   Supplemental Physical Therapist [place]    Pager [phone] Acute Rehab Office [phone]

## 2019-10-21 NOTE — Plan of Care (Signed)
  Problem: Consults Goal: RH GENERAL PATIENT EDUCATION Description: See Patient Education module for education specifics. Outcome: Progressing Goal: Skin Care Protocol Initiated - if Braden Score 18 or less Description: If consults are not indicated, leave blank or document N/A Outcome: Progressing Goal: Nutrition Consult-if indicated Outcome: Progressing Goal: Diabetes Guidelines if Diabetic/Glucose > 140 Description: If diabetic or lab glucose is > 140 mg/dl - Initiate Diabetes/Hyperglycemia Guidelines & Document Interventions  Outcome: Progressing   Problem: RH SKIN INTEGRITY Goal: RH STG SKIN FREE OF INFECTION/BREAKDOWN Description: Skin to remain free from infection and additional breakdown while on rehab with mod assistance. Outcome: Progressing Goal: RH STG MAINTAIN SKIN INTEGRITY WITH ASSISTANCE Description: STG Maintain Skin Integrity With mod Assistance. Outcome: Progressing Goal: RH STG ABLE TO PERFORM INCISION/WOUND CARE W/ASSISTANCE Description: STG Able To Perform Incision/Wound Care With mod Assistance. Outcome: Progressing   Problem: RH SAFETY Goal: RH STG ADHERE TO SAFETY PRECAUTIONS W/ASSISTANCE/DEVICE Description: STG Adhere to Safety Precautions With mod Assistance and appropriate assistive Device. Outcome: Progressing Goal: RH STG DECREASED RISK OF FALL WITH ASSISTANCE Description: STG Decreased Risk of Fall With mod Assistance. Outcome: Progressing   Problem: RH PAIN MANAGEMENT Goal: RH STG PAIN MANAGED AT OR BELOW PT'S PAIN GOAL Description: <4 on a 0-10 pain scale. Outcome: Progressing   Problem: RH KNOWLEDGE DEFICIT GENERAL Goal: RH STG INCREASE KNOWLEDGE OF SELF CARE AFTER HOSPITALIZATION Description: Patient and caregiver will demonstrate knowledge of medication management, weightbearing restrictions, and follow up care with the MD after discharge with min assist from [facility] staff. Outcome: Progressing

## 2019-10-21 NOTE — Progress Notes (Addendum)
Speech Language Pathology Daily Session Note  Patient Details  Name: Tonya Hunter MRN: [id number] Date of Birth: 08-18-1934  Today's Date: 10/21/2019 SLP Missed Time: 34 Minutes Missed Time Reason: Patient unwilling to participate  Short Term Goals: Week 4: SLP Short Term Goal 1 (Week 4): STGs=LTGs due to ELOS  Attempted to see pt for scheduled ST session, however despite Max encouragement she reported fatigue and was unwilling to participate. She reported leg pain and SLP offered interventions, however pt politely declined. Pt missed 60 mins skills ST today.      Arbutus Leas 10/21/2019, 7:21 AM
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
## 2019-10-22 ENCOUNTER — Inpatient Hospital Stay (HOSPITAL_COMMUNITY): Payer: Medicare Other | Admitting: Physical Therapy

## 2019-10-22 ENCOUNTER — Inpatient Hospital Stay (HOSPITAL_COMMUNITY): Payer: Medicare Other

## 2019-10-22 ENCOUNTER — Inpatient Hospital Stay (HOSPITAL_COMMUNITY): Payer: Medicare Other | Admitting: Occupational Therapy

## 2019-10-22 LAB — GLUCOSE, CAPILLARY
Glucose-Capillary: 99 mg/dL (ref 70–99)
Glucose-Capillary: 155 mg/dL — ABNORMAL HIGH (ref 70–99)
Glucose-Capillary: 128 mg/dL — ABNORMAL HIGH (ref 70–99)
Glucose-Capillary: 92 mg/dL (ref 70–99)

## 2019-10-22 NOTE — Progress Notes (Signed)
Speech Language Pathology Daily Session Note  Patient Details  Name: Tonya Hunter MRN: [id number] Date of Birth: Oct 25, 1934  Today's Date: 10/22/2019 SLP Individual Time: 1035-1100 SLP Individual Time Calculation (min): 25 min  Short Term Goals: Week 4: SLP Short Term Goal 1 (Week 4): STGs=LTGs due to ELOS  Skilled Therapeutic Interventions: Skilled ST services focused on cognitive skills. Pt was laying in bed, but was agreeable to participate in services as long as " I don't have to move."  Pt requested to call husband, pt was able to recall phone number and SLP left a voicemail. Pt requested information about when she is leaving the hospital and became tearful. SLP provided further decision will be made following today's conference. Pt was able to express wants/needs with repositioning in bed, clarification with mod A verbal cues. SLP facilitated basic problem solving and sustained attention skills, pt required max A verbal cues to identify more than one difference among two cards. Pt was left in room with call bell within reach and bed alarm set. ST recommends to continue skilled ST services.      Pain Pain Assessment Faces Pain Scale: Hurts a little bit Pain Type: Acute pain Pain Location: Leg Pain Intervention(s): Repositioned  Therapy/Group: Individual Therapy  [name]  [facility] 10/22/2019, 11:38 AM

## 2019-10-22 NOTE — Progress Notes (Signed)
Physical Therapy Session Note  Patient Details  Name: Tonya Hunter MRN: [id number] Date of Birth: September 01, 1934  Today's Date: 10/22/2019 PT Individual Time: 1884-1660 PT Individual Time Calculation (min): 59 min   Short Term Goals: Week 4:  PT Short Term Goal 1 (Week 4): Pt will perform bed mobility with CGA. PT Short Term Goal 2 (Week 4): Pt will perform stand pivot transfer with modA. PT Short Term Goal 3 (Week 4): Pt will ambulate 10' with modA.  Skilled Therapeutic Interventions/Progress Updates:     Pt received in R sidelying and agreeable to therapy. Reports pain in BLEs. Number not provided. PT provided rest breaks as need to manage pain symptoms. Supine to sit with supervision for sequencing and body mechanics. Pt seated at EOB several minutes and requesting to stay there, but agreeable to OOB mobility with strong encouragement. Sit to stand with minA and stand pivot to [facility] with modA and significant anxiety from pt. WC transport to gym for time conservation. Pt performs x5' ambulation with heavy modA from PT, use of RW, and +2 chair follow. Pt needing max encouragement and facilitation of RLE advancement. Also tending to buckle through RLE during stance phase. Pt then performs x3 additional reps of sit to stand with RW and minA. On final rep pt stand >5 minutes with PT providing distraction to manage pt anxiety about standing. WC transport back to room. Stand pivot to bed with modA and return to supine with supervision for positioning. Pt left supine in bed with all needs within reach.  Therapy Documentation Precautions:  Precautions Precautions: Fall Precaution Comments: Pt h/o confusion Required Braces or Orthoses: Other Brace Other Brace: AFO Restrictions Weight Bearing Restrictions: Yes RLE Weight Bearing: Weight bearing as tolerated Other Position/Activity Restrictions: RLE    Therapy/Group: Individual Therapy  Breck Coons, PT, DPT 10/22/2019, 4:29 PM

## 2019-10-22 NOTE — Progress Notes (Signed)
Occupational Therapy Session Note  Patient Details  Name: Tonya Hunter MRN: [id number] Date of Birth: Nov 09, 1934  Today's Date: 10/22/2019 OT Individual Time: 1400-1500 OT Individual Time Calculation (min): 60 min    Short Term Goals: Week 4:  OT Short Term Goal 1 (Week 4): patient will complete supine to/from sitting postion edge of bed with min/mod A and tolerate unsupported sitting fo 10 minutes daily OT Short Term Goal 2 (Week 4): patient will complete sit pivot transfers bed to/from w/c with mod/max A OT Short Term Goal 3 (Week 4): patient will complete UB dressing with min A  Skilled Therapeutic Interventions/Progress Updates:    Patient in bed, alert, denies pain at this time.  She states that she is not going to do anything this afternoon as she had planned to rest.  With encouragement she moved from supine to sitting edge of bed with CS.  She maintained unsupported sitting with CS for 50 minutes - engaged in conversation, she brushed her hair, but she declined to complete activity/exercise, wash or change clothing.   She refused transfer to w/c or other seating.  At end of session agreed to stand - sit to stand from bed surface CGA with RW - she maintained standing for 3 minutes with CGA.  Returned to sitting CGA, sitting to supine with CGA.  Bed alarm set and call bell in reach at close of session.    Therapy Documentation Precautions:  Precautions Precautions: Fall Precaution Comments: Pt h/o confusion Required Braces or Orthoses: Other Brace Other Brace: AFO Restrictions Weight Bearing Restrictions: Yes RLE Weight Bearing: Weight bearing as tolerated Other Position/Activity Restrictions: RLE   Therapy/Group: Individual Therapy  Carlos Levering 10/22/2019, 7:36 AM
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
## 2019-10-22 NOTE — Progress Notes (Addendum)
Patient ID: Tonya Hunter, female   DOB: Mar 13, 1935, 85 y.o.   MRN: [id number]  Per medical team, after team conference pt will need to d/c to home or SNF as pt is not making many gains in therapy and not participating; pt will require 24/7 care for physical assistance. SW met with pt in room to discuss plan of care to home or SNF. Pt does not want to go to a SNF. SW to follow-up with pt husband.  SW called pt husband Tonya Hunter [phone]) to discuss plan of care with regard to discharge based on updates from team conference. Pt husband asked if pt were walking. SW explained based on updates pt was walking a some but would still require 24/7 care and was not participating. Pt husband stated he will speak with pt. SW indicated we can work towards a d/c date but asked him if he was able to provide care to pt. He cut off SW and stated he did not need to speak with me and required updates from the physician. SW informed him to have a nice day.   *Further updates from speaking with management, pt husband will now be issued a [place] letter. SW left a message for pt husband requesting a return phone call to discuss changes with pt hospital stay. SW waiting on follow-up.   [name], MSW, [place] Office: [phone] Cell: [phone] Fax: [phone]

## 2019-10-23 ENCOUNTER — Inpatient Hospital Stay (HOSPITAL_COMMUNITY): Payer: Medicare Other

## 2019-10-23 ENCOUNTER — Inpatient Hospital Stay (HOSPITAL_COMMUNITY): Payer: Medicare Other | Admitting: Speech Pathology

## 2019-10-23 LAB — GLUCOSE, CAPILLARY
Glucose-Capillary: 98 mg/dL (ref 70–99)
Glucose-Capillary: 153 mg/dL — ABNORMAL HIGH (ref 70–99)
Glucose-Capillary: 93 mg/dL (ref 70–99)
Glucose-Capillary: 113 mg/dL — ABNORMAL HIGH (ref 70–99)

## 2019-10-23 NOTE — Progress Notes (Addendum)
Patient ID: Tonya Hunter, female   DOB: [birth year], 85 y.o.   MRN: [id number]  SW left a message for pt husband Eduard Clos [phone]) requesting a return phone call to discuss changes with pt hospital stay [facility] letter that needs to be delivered).  SW waiting on follow-up.   *HINN delivered to patient husband. As SW tried to explain form, pt husband became loud with SW and was speaking over. SW informed pt husband pt has now been discharged from the hospital, and he will need to decide if the plan will be home home health with hospice as recommended by GI and/or SNF. He stated that he would take her home. SW informed him that he has the right to appeal the decision and the number is listed on the form. SW asked if he could sign the form. Husband did not sign the form, he just stared at SW, and changed topics to discuss preferred [facility]. SW asked pt husband if he was physically able to care for pt. He continued to stare at [place] reiterated the reason for this question is to determine how he would be able to get her home. He was under the impression the home care agency would assist. SW explained the option could possibly be ambulance transport home, and unsure if this can be covered by insurance. He would like ambulance transport home. SW discussed family education with pt husband. SW provided family education times for tomorrow (9am/11am/1:30pm) and Friday 10am-12pm. He states he can be present.   SW sent HHPT/OT/ST/CNA referral to [facility]. SW waiting on follow-up.   [name], MSW, [place] Office: [phone] Cell: [phone] Fax: [phone]

## 2019-10-23 NOTE — Progress Notes (Signed)
Patient ID: Tonya Hunter, female   DOB: 23-Mar-1935, 85 y.o.   MRN: [id number]   SW left message for pt dtr Tonya Hunter requesting to return phone call to provide updates on plan for pt to d/c to home. SW informed SW not in office tomorrow but will return on Friday.   *SW received return phone call from pt dtr Tonya Hunter. SW informed this was a courtesy phone call, and provided updates on what has transpired and pt not making any further gains in rehab as she is not participating. SW discussed family education plan, and how we do not feel this is a safe discharge plan. SW informed if it is not a safe discharge, we will submit an APS referral. SW also explained [place] letter, and pt husband right to appeal decision. SW indicated will provide updates if any. She is aware the plan for d/c is Monday (6/14).   [name], MSW, [place] Office: [phone] Cell: [phone] Fax: [phone]

## 2019-10-23 NOTE — Progress Notes (Signed)
Speech Language Pathology Weekly Progress and Session Note  Patient Details  Name: Tonya Hunter MRN: [id number] Date of Birth: 1935-04-02  Beginning of progress report period: October 16, 2019 End of progress report period: October 23, 2019  Today's Date: 10/23/2019 SLP Individual Time: 4715-8063 SLP Individual Time Calculation (min): 25 min  Short Term Goals: Week 4: SLP Short Term Goal 1 (Week 4): STGs=LTGs due to ELOS SLP Short Term Goal 1 - Progress (Week 4): Not met    New Short Term Goals: Week 5: SLP Short Term Goal 1 (Week 5): Patient will demonstrate sustained attention to functional tasks for ~10 minutes with Mod verbal cues for redirection. SLP Short Term Goal 2 (Week 5): Patient will recall new, daily information with use of external aids with Max A verbal cues. SLP Short Term Goal 3 (Week 5): Patient will identify 2 physical and 2 cognitive changes with Max A verbal cues. SLP Short Term Goal 4 (Week 5): Patient will demonstrate functional problem solving with Mod A verbal cues.  Weekly Progress Updates: Patient continues to make limited gains as progress is impacted by minimal participation secondary to feeling ill and fatigue. Overall, patient continues to require Max-Total A to complete functional and familiar tasks safely in regards to attention, problem solving, recall and awareness. Patient and family education is ongoing and patient's discharge plan is still pending. Patient would benefit from continued skilled SLP intervention to maximize her cognitive functioning and overall functional independence prior to discharge.      Intensity: Minumum of 1-2 x/day, 30 to 90 minutes Frequency: 3 to 5 out of 7 days Duration/Length of Stay: TBD due to SNF placement Treatment/Interventions: Cognitive remediation/compensation;Cueing hierarchy;Functional tasks;Medication managment;Patient/family education;Internal/external aids;Therapeutic Activities;Environmental controls   Daily  Session  Skilled Therapeutic Interventions: Skilled treatment session focused on cognitive goals. Upon arrival, patient was lethargic and required Mod verbal cues for arousal. SLP initiated administration of the Cognistat but was only able to complete the orientation subtest in which patient scored 1/12 points. Patient was only oriented to [place] and was disoriented to age, date and place with perseveration on her specific birthdate when answering all orientation qurstions. Patinet's husband present and attempted to cue the patient, however, patient perceived the cues as her husband "being ugly." SLP provided a simple calendar to focus on recall of date that the patient was able to utilize with Max verbal and visual cues. Patient left upright in bed with alarm on and all needs within reach. Continue with current plan of care.     Pain No/Denies Pain   Therapy/Group: Individual Therapy  Oneil Behney 10/23/2019, 6:19 AM

## 2019-10-23 NOTE — Progress Notes (Signed)
Physical Therapy Session Note  Patient Details  Name: Tonya Hunter MRN: [id number] Date of Birth: 12-15-1934  Today's Date: 10/23/2019 PT Individual Time: 0902-1023 PT Individual Time Calculation (min): 81 min   Short Term Goals: Week 4:  PT Short Term Goal 1 (Week 4): Pt will perform bed mobility with CGA. PT Short Term Goal 2 (Week 4): Pt will perform stand pivot transfer with modA. PT Short Term Goal 3 (Week 4): Pt will ambulate 10' with modA.  Skilled Therapeutic Interventions/Progress Updates:     Pt received supine in bed and agreeable to therapy. Reports pain in RLE. Number not provided. PT provides rest breaks as needed throughout session Performs sit to supine with supervision and increased time required. Stand pivot transfer from bed to [facility] with modA HHA and significant anxiety from pt. WC transport to therapy gym for time conservation. Pt performs x3 bouts of ambulation in // bars, approximately 5' each rep. PT provides modA manual facilitation at hips for extension and providing blocking of R knee, as well as assisting progression of RLE during swing phase. Pt very anxious throughout and hesitant to attempt ambulation, but able to perform with strong encouragement. Pt performs additional rep of sit to stand with minA/modA and reports actively having bowel movement. WC transport back to room, stand pivot back to bed with modA, sit to supine with minA faciltation of BLEs. Pt performs bilateral rolling with supervision and PT provides pericare and linen change. Pt urinates copious amount during brief change, saturating bed. PT provides modA for transfer to [facility] to allow cleaning of bed. Pt left seated in WC with alarm intact and all needs within reach.  Therapy Documentation Precautions:  Precautions Precautions: Fall Precaution Comments: Pt h/o confusion Required Braces or Orthoses: Other Brace Other Brace: AFO Restrictions Weight Bearing Restrictions: Yes RLE Weight Bearing: Weight  bearing as tolerated Other Position/Activity Restrictions: RLE    Therapy/Group: Individual Therapy  Breck Coons, PT, DPT 10/23/2019, 12:48 PM
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
## 2019-10-23 NOTE — Progress Notes (Signed)
Occupational Therapy Session Note  Patient Details  Name: Tonya Hunter MRN: [id number] Date of Birth: [birth year]  Today's Date: 10/23/2019 OT Individual Time: 1601-0932 OT Individual Time Calculation (min): 16 min  and Today's Date: 10/23/2019 OT Missed Time: 29 Minutes Missed Time Reason: Patient unwilling/refused to participate without medical reason   Short Term Goals: Week 4:  OT Short Term Goal 1 (Week 4): patient will complete supine to/from sitting postion edge of bed with min/mod A and tolerate unsupported sitting fo 10 minutes daily OT Short Term Goal 2 (Week 4): patient will complete sit pivot transfers bed to/from w/c with mod/max A OT Short Term Goal 3 (Week 4): patient will complete UB dressing with min A  Skilled Therapeutic Interventions/Progress Updates:    1:1. Pt received in w/c agreeable to OT. Pt tearful upon arrival, however when OT attempts to explore pt states, "I just need to get to bed, im not doing this with you." Pt requires MOD A for transfer back to bed via squat pivot with pt unable ot take direction for hand placement. Pt lies back into bed and reporting nausea. Pt given emesis bag but not needed. Pt lies on side and OT scoots pt up to [facility] to decrease sheering onto sacral wound. Exited session with pt missing 29 min skilled OT d/t pt refusing ADL, mobility and EOB sitting sitting in bed sidelying with call light in reach and all needs met  Therapy Documentation Precautions:  Precautions Precautions: Fall Precaution Comments: Pt h/o confusion Required Braces or Orthoses: Other Brace Other Brace: AFO Restrictions Weight Bearing Restrictions: Yes RLE Weight Bearing: Weight bearing as tolerated Other Position/Activity Restrictions: RLE General: General OT Amount of Missed Time: 29 Minutes Vital Signs:   Pain: Pain Assessment Pain Scale: 0-10 Pain Score: 0-No pain ADL: ADL Eating: Set up Where Assessed-Eating: Bed level Grooming: Setup Where  Assessed-Grooming: Bed level Upper Body Bathing: Moderate assistance Where Assessed-Upper Body Bathing: Edge of bed Lower Body Bathing: Other (comment)(+2 helpers) Where Assessed-Lower Body Bathing: Bed level Upper Body Dressing: Moderate assistance Where Assessed-Upper Body Dressing: Edge of bed Lower Body Dressing: Dependent Where Assessed-Lower Body Dressing: Bed level Toileting: Dependent Where Assessed-Toileting: Bed level Toilet Transfer: Unable to assess Toilet Transfer Method: Unable to assess Tub/Shower Transfer: Unable to assess Social research officer, government: Unable to assess ADL Comments: Patient currently Mod A with UB ADLs and +2 assist for LB ADLs at bed level Vision   Perception    Praxis   Exercises:   Other Treatments:     Therapy/Group: Individual Therapy  Tonny Branch 10/23/2019, 11:37 AM
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
## 2019-10-24 ENCOUNTER — Inpatient Hospital Stay (HOSPITAL_COMMUNITY): Payer: Medicare Other

## 2019-10-24 ENCOUNTER — Inpatient Hospital Stay (HOSPITAL_COMMUNITY): Payer: Medicare Other | Admitting: Speech Pathology

## 2019-10-24 ENCOUNTER — Inpatient Hospital Stay (HOSPITAL_COMMUNITY): Payer: Medicare Other | Admitting: Occupational Therapy

## 2019-10-24 LAB — GLUCOSE, CAPILLARY
Glucose-Capillary: 112 mg/dL — ABNORMAL HIGH (ref 70–99)
Glucose-Capillary: 165 mg/dL — ABNORMAL HIGH (ref 70–99)
Glucose-Capillary: 118 mg/dL — ABNORMAL HIGH (ref 70–99)
Glucose-Capillary: 103 mg/dL — ABNORMAL HIGH (ref 70–99)

## 2019-10-24 NOTE — Progress Notes (Signed)
SLP Cancellation Note  Patient Details Name: LESLY PONTARELLI MRN: [id number] DOB: [birth year]   Cancelled treatment:       Patient missed 30 minutes of skilled SLP intervention. Upon arrival, patient with a sheet over her head and requested to stay in bed despite encouragement. When SLP informed patient she could her cognitive assessment while in bed she replied, "just let me lay here." Therefore, patient missed SLP session.                                                                                                Theresa Dohrman 10/24/2019, 12:31 PM

## 2019-10-24 NOTE — Progress Notes (Signed)
Occupational Therapy Session Note  Patient Details  Name: Tonya Hunter MRN: [id number] Date of Birth: [birth year]  Today's Date: 10/24/2019 OT Individual Time: 0900-1000 OT Individual Time Calculation (min): 60 min    Short Term Goals: Week 4:  OT Short Term Goal 1 (Week 4): patient will complete supine to/from sitting postion edge of bed with min/mod A and tolerate unsupported sitting fo 10 minutes daily OT Short Term Goal 2 (Week 4): patient will complete sit pivot transfers bed to/from w/c with mod/max A OT Short Term Goal 3 (Week 4): patient will complete UB dressing with min A  Skilled Therapeutic Interventions/Progress Updates:    Patient in bed, incontinent of large amount of urine.  Supine to sitting edge of bed with CS.  Sit to stand at edge of bed with min a.  She is able to maintain standing with CGA ( and encouragement)  for nursing to complete sacral dressing change and hygiene/donning clean brief.  SPT with RW bed to w/c mod A with cues for safety.  Patient completed UB bathing and dressing w/c level at sink with min A.  LB bathing and dressing max A/dep.  Sit to stand min A for dependent clothing management of pants.  Husband arrived with 15 minutes left in session.  Reviewed DME and home set up.  They own a bedside commode and rolling walkers.  Recommend hospital bed for home environment due to ongoing incontinence and need to adjust bed height for transfers.  Reviewed patients functional status and guarding techniques with husband - practiced SPT w/c to bed with husband and myself guarding patient (husband requires moderate cues after initial instruction)- he states that they will have help at home via home health agency.  Recommend additional assistance for transfers and self care due to patients inconsistent participation.  Encouraged husband to attend all therapy sessions for hands on practice opportunities between now and discharge.  Patient with min A to move from sitting to  supine.  Bed alarm set, call bell in reach and telesitter active at close of session.    Therapy Documentation Precautions:  Precautions Precautions: Fall Precaution Comments: Pt h/o confusion Required Braces or Orthoses: Other Brace Other Brace: AFO Restrictions Weight Bearing Restrictions: Yes RLE Weight Bearing: Weight bearing as tolerated Other Position/Activity Restrictions: RLE  Therapy/Group: Individual Therapy  Carlos Levering 10/24/2019, 7:37 AM

## 2019-10-24 NOTE — Plan of Care (Signed)
°  Problem: Consults Goal: RH GENERAL PATIENT EDUCATION Description: See Patient Education module for education specifics. Outcome: Progressing Goal: Skin Care Protocol Initiated - if Braden Score 18 or less Description: If consults are not indicated, leave blank or document N/A Outcome: Progressing Goal: Nutrition Consult-if indicated Outcome: Progressing Goal: Diabetes Guidelines if Diabetic/Glucose > 140 Description: If diabetic or lab glucose is > 140 mg/dl - Initiate Diabetes/Hyperglycemia Guidelines & Document Interventions  Outcome: Progressing   Problem: RH BOWEL ELIMINATION Goal: RH STG MANAGE BOWEL WITH ASSISTANCE Description: STG Manage Bowel with mod Assistance. Outcome: Progressing Goal: RH STG MANAGE BOWEL W/MEDICATION W/ASSISTANCE Description: STG Manage Bowel with Medication with min Assistance. Outcome: Progressing   Problem: RH BLADDER ELIMINATION Goal: RH STG MANAGE BLADDER WITH ASSISTANCE Description: STG Manage Bladder With max Assistance Outcome: Progressing Goal: RH STG MANAGE BLADDER WITH MEDICATION WITH ASSISTANCE Description: STG Manage Bladder With Medication With min Assistance. Outcome: Progressing   Problem: RH SKIN INTEGRITY Goal: RH STG SKIN FREE OF INFECTION/BREAKDOWN Description: Skin to remain free from infection and additional breakdown while on rehab with mod assistance. Outcome: Progressing Goal: RH STG MAINTAIN SKIN INTEGRITY WITH ASSISTANCE Description: STG Maintain Skin Integrity With mod Assistance. Outcome: Progressing Goal: RH STG ABLE TO PERFORM INCISION/WOUND CARE W/ASSISTANCE Description: STG Able To Perform Incision/Wound Care With mod Assistance. Outcome: Progressing   Problem: RH SAFETY Goal: RH STG ADHERE TO SAFETY PRECAUTIONS W/ASSISTANCE/DEVICE Description: STG Adhere to Safety Precautions With mod Assistance and appropriate assistive Device. Outcome: Progressing Goal: RH STG DECREASED RISK OF FALL WITH  ASSISTANCE Description: STG Decreased Risk of Fall With mod Assistance. Outcome: Progressing   Problem: RH PAIN MANAGEMENT Goal: RH STG PAIN MANAGED AT OR BELOW PT'S PAIN GOAL Description: <4 on a 0-10 pain scale. Outcome: Progressing   Problem: RH KNOWLEDGE DEFICIT GENERAL Goal: RH STG INCREASE KNOWLEDGE OF SELF CARE AFTER HOSPITALIZATION Description: Patient and caregiver will demonstrate knowledge of medication management, weightbearing restrictions, and follow up care with the MD after discharge with min assist from [facility] staff. Outcome: Progressing
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
## 2019-10-24 NOTE — Progress Notes (Signed)
Pt not always compliant with q2 turns while awake. Pt allowed to be turned when toileted, but preferred to stay supine. Educated pt on need to relieve pressure off sacrum due to pressure injury, however pt still nocompliant at times.

## 2019-10-24 NOTE — Progress Notes (Signed)
Physical Therapy Session Note  Patient Details  Name: Tonya Hunter MRN: [id number] Date of Birth: [birth year]  Today's Date: 10/24/2019 PT Missed Time: 88 Minutes Missed Time Reason: Patient unwilling to participate  Short Term Goals: Week 4:  PT Short Term Goal 1 (Week 4): Pt will perform bed mobility with CGA. PT Short Term Goal 2 (Week 4): Pt will perform stand pivot transfer with modA. PT Short Term Goal 3 (Week 4): Pt will ambulate 10' with modA.  Skilled Therapeutic Interventions/Progress Updates:     Pt refuses PT despite multiple attempts at encouragement and discussion with pt on benefits of participation. Pt says "I just don't feel like it. I'm all tore up and let's just not talk about it."  Therapy Documentation Precautions:  Precautions Precautions: Fall Precaution Comments: Pt h/o confusion Required Braces or Orthoses: Other Brace Other Brace: AFO Restrictions Weight Bearing Restrictions: Yes RLE Weight Bearing: Weight bearing as tolerated Other Position/Activity Restrictions: RLE  Therapy/Group: Individual Therapy  Breck Coons , PT, DPT 10/24/2019, 1:51 PM

## 2019-10-25 ENCOUNTER — Encounter (HOSPITAL_COMMUNITY): Payer: Medicare Other | Admitting: Speech Pathology

## 2019-10-25 ENCOUNTER — Ambulatory Visit (HOSPITAL_COMMUNITY): Payer: Medicare Other

## 2019-10-25 ENCOUNTER — Encounter (HOSPITAL_COMMUNITY): Payer: Medicare Other | Admitting: Occupational Therapy

## 2019-10-25 LAB — GLUCOSE, CAPILLARY
Glucose-Capillary: 112 mg/dL — ABNORMAL HIGH (ref 70–99)
Glucose-Capillary: 146 mg/dL — ABNORMAL HIGH (ref 70–99)
Glucose-Capillary: 91 mg/dL (ref 70–99)
Glucose-Capillary: 125 mg/dL — ABNORMAL HIGH (ref 70–99)

## 2019-10-25 NOTE — Progress Notes (Signed)
Occupational Therapy Session Note  Patient Details  Name: Tonya Hunter MRN: [id number] Date of Birth: 06/06/1934  Today's Date: 10/25/2019 OT Individual Time: 1115-1209 OT Individual Time Calculation (min): 54 min    Short Term Goals: Week 2:  OT Short Term Goal 1 (Week 2): Patient will thread BLE into pants with Mod A and use of AE/DME as needed. OT Short Term Goal 1 - Progress (Week 2): Not met OT Short Term Goal 2 (Week 2): Patient will complete SB transfer with Mod A. OT Short Term Goal 2 - Progress (Week 2): Not met OT Short Term Goal 3 (Week 2): Patient will complete toilet transfer to BSC with SB and 1 person assist with adherence to RLE weightbearing precautions. OT Short Term Goal 3 - Progress (Week 2): Not met OT Short Term Goal 4 (Week 2): Patient will complete 1/3 parts of toileting with 1 person assist.  Skilled Therapeutic Interventions/Progress Updates:    Upon entering the room, pt and caregiver sleeping in room. OT alerted pt and family member of scheduled family education this session. OT provided education on skin integrity concerns with current skin break down and incontinence. OT recommended timed toileting either with bed pan or transferring onto BSC. Pt began crying, " I can't do this today". OT encouraged pt and family member to continue. Pt performed supine >sit with supervision. Pt sitting on EOB with caregiver placing gait belt onto pt for transfer. OT providing mod cuing to caregiver for positioning and technique. Pt standing with RW and mod A with therapist being second person for safety with transfer to BSC. Caregiver needing step by step instructions to manage clothing. Pt was able to void once on commode. Pt continues to state, " she will do it (pointing to this therapist)" and this therapist informed pt and caregiver that they must do all aspect of care together as this OT would not be assisting them at home. Caregiver needing assistance from OT for balance and  clothing management and transfer back to bed. OT verbalizing concerns over session and how much help caregiver needs with this one self care tasks needing to be performed multiple times each day. OT recommended SNF for short term rehab in order to get pt stronger and he agreed that based on this session that would be best. Social worker arrived to confirm information and answer questions. OT exiting the room with pt in side lying position and all needs within reach.    Therapy Documentation Precautions:  Precautions Precautions: Fall Precaution Comments: Pt h/o confusion Required Braces or Orthoses: Other Brace Other Brace: AFO Restrictions Weight Bearing Restrictions: Yes RLE Weight Bearing: Weight bearing as tolerated Other Position/Activity Restrictions: RLE General:   Vital Signs:  Pain: Pain Assessment Pain Scale: 0-10 Pain Score: 0-No pain ADL: ADL Eating: Set up Where Assessed-Eating: Bed level Grooming: Setup Where Assessed-Grooming: Bed level Upper Body Bathing: Moderate assistance Where Assessed-Upper Body Bathing: Edge of bed Lower Body Bathing: Other (comment) (+2 helpers) Where Assessed-Lower Body Bathing: Bed level Upper Body Dressing: Moderate assistance Where Assessed-Upper Body Dressing: Edge of bed Lower Body Dressing: Dependent Where Assessed-Lower Body Dressing: Bed level Toileting: Dependent Where Assessed-Toileting: Bed level Toilet Transfer: Unable to assess Toilet Transfer Method: Unable to assess Tub/Shower Transfer: Unable to assess Walk-In Shower Transfer: Unable to assess ADL Comments: Patient currently Mod A with UB ADLs and +2 assist for LB ADLs at bed level   Therapy/Group: Individual Therapy  ,  P 10/25/2019, 12:34 PM 

## 2019-10-25 NOTE — Plan of Care (Signed)
  Problem: Consults Goal: RH GENERAL PATIENT EDUCATION Description: See Patient Education module for education specifics. Outcome: Progressing Goal: Skin Care Protocol Initiated - if Braden Score 18 or less Description: If consults are not indicated, leave blank or document N/A Outcome: Progressing Goal: Nutrition Consult-if indicated Outcome: Progressing Goal: Diabetes Guidelines if Diabetic/Glucose > 140 Description: If diabetic or lab glucose is > 140 mg/dl - Initiate Diabetes/Hyperglycemia Guidelines & Document Interventions  Outcome: Progressing   Problem: RH BOWEL ELIMINATION Goal: RH STG MANAGE BOWEL WITH ASSISTANCE Description: STG Manage Bowel with mod Assistance. Outcome: Progressing Goal: RH STG MANAGE BOWEL W/MEDICATION W/ASSISTANCE Description: STG Manage Bowel with Medication with min Assistance. Outcome: Progressing   Problem: RH BLADDER ELIMINATION Goal: RH STG MANAGE BLADDER WITH ASSISTANCE Description: STG Manage Bladder With max Assistance Outcome: Progressing Goal: RH STG MANAGE BLADDER WITH MEDICATION WITH ASSISTANCE Description: STG Manage Bladder With Medication With min Assistance. Outcome: Progressing   Problem: RH SKIN INTEGRITY Goal: RH STG MAINTAIN SKIN INTEGRITY WITH ASSISTANCE Description: STG Maintain Skin Integrity With mod Assistance. Outcome: Progressing Goal: RH STG ABLE TO PERFORM INCISION/WOUND CARE W/ASSISTANCE Description: STG Able To Perform Incision/Wound Care With mod Assistance. Outcome: Progressing   Problem: RH SAFETY Goal: RH STG ADHERE TO SAFETY PRECAUTIONS W/ASSISTANCE/DEVICE Description: STG Adhere to Safety Precautions With mod Assistance and appropriate assistive Device. Outcome: Progressing Goal: RH STG DECREASED RISK OF FALL WITH ASSISTANCE Description: STG Decreased Risk of Fall With mod Assistance. Outcome: Progressing   Problem: RH PAIN MANAGEMENT Goal: RH STG PAIN MANAGED AT OR BELOW PT'S PAIN GOAL Description: <4  on a 0-10 pain scale. Outcome: Progressing   Problem: RH KNOWLEDGE DEFICIT GENERAL Goal: RH STG INCREASE KNOWLEDGE OF SELF CARE AFTER HOSPITALIZATION Description: Patient and caregiver will demonstrate knowledge of medication management, weightbearing restrictions, and follow up care with the MD after discharge with min assist from [facility] staff. Outcome: Progressing

## 2019-10-25 NOTE — Progress Notes (Signed)
Physical Therapy Weekly Progress Note  Patient Details  Name: Tonya Hunter MRN: [id number] Date of Birth: 02/21/1935  Beginning of progress report period: October 18, 2019 End of progress report period: October 25, 2019  Today's Date: 10/25/2019 PT Individual Time: 1301-1350 PT Individual Time Calculation (min): 49 min   Patient has met 2 of 3 short term goals.  Pt isp rogressing toward mobility goals slowly, continuing to refuse therapy ~50% of time and always requiring max encouragement to participate in PT. Pt has, however, improved independence with bed mobility, functional transfers, and has initiated ambulation. Pt ambulates max of 5' and demonstrates tendency to buckle through RLE when weight bearing. Pt impairments include global weakness and deconditioning from prolonged inactivity as well as increased weakness in RLE. Pt therapy to focus on increasing activity tolerance, standing balance, and increasing ambulation distances.  Patient continues to demonstrate the following deficits muscle weakness, decreased cardiorespiratoy endurance, decreased initiation, decreased awareness, decreased problem solving, decreased safety awareness and decreased memory and decreased sitting balance, decreased standing balance, decreased postural control and decreased balance strategies and therefore will continue to benefit from skilled PT intervention to increase functional independence with mobility.  Patient progressing toward long term goals..  Continue plan of care.  PT Short Term Goals Week 4:  PT Short Term Goal 1 (Week 4): Pt will perform bed mobility with CGA. PT Short Term Goal 1 - Progress (Week 4): Met PT Short Term Goal 2 (Week 4): Pt will perform stand pivot transfer with modA. PT Short Term Goal 2 - Progress (Week 4): Met PT Short Term Goal 3 (Week 4): Pt will ambulate 10' with modA. PT Short Term Goal 3 - Progress (Week 4): Progressing toward goal Week 5:  PT Short Term Goal 5 (Week 5): STG  = LTGs due to ELOS  Skilled Therapeutic Interventions/Progress Updates:     Pt receive in R sidelying in bed. Initially attempts to refuse therapy but is agreeable after max encouragement to participate, with education on correlation between participation and gaining functional strength to be able to eventually go home. No report of pain. Pt performs bed mobility with supervision for safety. Sit to stand and stand pivot to WC with modA HHA and significant patient anxiety. Pt performs additional stand step transfer from WC to mat table with RW and modA. Pt performs x3 reps of sit to stand from slightly elevated mat table with RW and modA. Once in standing, PT provides mina/modA at pt's hips to encourage extension with tactile cues at shoulders for upright posture. Pt requesting immediately to sit back down each time she stands but PT is able to redirect pt and she remains standing 2-3 minutes each rep. On final rep pt takes several sidesteps to the L with modA. ModA for stand pivot mat>WC>bed and supervision for sit to supine with verbal cues for body mechanics and sequencing. Pt left in R sidelying with all needs within reach.  Therapy Documentation Precautions:  Precautions Precautions: Fall Precaution Comments: Pt h/o confusion Required Braces or Orthoses: Other Brace Other Brace: AFO Restrictions Weight Bearing Restrictions: Yes RLE Weight Bearing: Weight bearing as tolerated Other Position/Activity Restrictions: RLE   Therapy/Group: Individual Therapy   C , PT, DPT 10/25/2019, 4:30 PM  
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
## 2019-10-25 NOTE — Progress Notes (Addendum)
Patient ID: SEPHORA BOYAR, female   DOB: 1935-03-23, 85 y.o.   MRN: [id number]  SW spoke with pt assigned RN to see if pt husband here for family education. Reports pt husband left prior to family education even when informed.   SW ordered hospital bed with [facility] via [facility].   SW left message for pt husband Eduard Clos [phone]) informing him on updates of him leaving, and asking if he could return to hospital for family education; informed on hospital bed ordered with [facility] and they will call to discuss delivery; [facility] for HHPT/OT/CNA/SN (wound care) and; ambulance pick up here at 12pm to home. SW requested return phone call if there were any issues as this SW will be in late on Monday.  *SW received returned phone call. SW discussed family education and the importance of him being here since his wife is discharging on Monday. He said he was concerned about the hospital bed and did not know about [facility]. He also stated that ambulance pick up here to home at 12pm on Monday would be just fine. SW reminded him that he did not have anything to worry about, and he is needed for family education. He states he is on his way back to the hospital. SW informed pt assigned RN.   SW scheduled ambulance pick up with [facility] at 12pm on Monday, 6/14.  *SW received updates that husband was here with pt in therapy and is now okay with placement as pt physical needs are too great. Pt is also amenable to short term rehab at a SNF. Possible preferred SNF: [facility] and [facility]. Reports he will follow-up with SW to confirm preference.   SW cancelled DME with [facility] and withdrew referral with [facility] for HHPT/OT/CNA/SN (woudn care).   *SW received updates from pt husband Eduard Clos stating preferred SNF would be [facility]. SW explained SW will follow-up, however, if facility is unable to accept, we will have to move forward with other facilities that have extended a bed offer. SW left  message for Admissions with [facility] ([place]/[phone]). Sw waiting on follow-up.   [name], MSW, [place] Office: [phone] Cell: [phone] Fax: [phone]

## 2019-10-25 NOTE — Progress Notes (Signed)
Speech Language Pathology Daily Session Note  Patient Details  Name: Tonya Hunter MRN: [id number] Date of Birth: 02/14/1935  Today's Date: 10/25/2019 SLP Individual Time: 1030-1110 SLP Individual Time Calculation (min): 40 min  Short Term Goals: Week 5: SLP Short Term Goal 1 (Week 5): Patient will demonstrate sustained attention to functional tasks for ~10 minutes with Mod verbal cues for redirection. SLP Short Term Goal 2 (Week 5): Patient will recall new, daily information with use of external aids with Max A verbal cues. SLP Short Term Goal 3 (Week 5): Patient will identify 2 physical and 2 cognitive changes with Max A verbal cues. SLP Short Term Goal 4 (Week 5): Patient will demonstrate functional problem solving with Mod A verbal cues.  Skilled Therapeutic Interventions: Skilled treatment session focused on family education. Patient's husband present and educated on patient's current cognitive functioning in regards to requiring overall Max-Total A for sustained attention, initiation, problem solving and recall. Because of this, patient's husband was educated that she would require total care and Max encouragement along with a routine/schedule to encourage out of bed activity at home. Patient's husband verbalized understanding but also reported he would have a home health aid to assist. SLP re-educated patient's husband that aid is 1-2X/ week for 1-2 hours and only provides personal care for the patient. Patient was incontinent during my session and husband educated on need for briefs, etc at home. Throughout education, when presented with a variety of issues/deficits patient's husband would report, "Dot said she was going to work on that, right Dot?" and she would respond, "yeah." However, SLP attempted to provide examples of when Dot declined out of bed therapy, etc despite reporting she was going to "work on it" to infer that although she may verbalize agreement, she is not agreeable within  the moment or during functional tasks. Handouts were given to reinforce information. Patient left supine in bed with alarm on and all needs within reach. Continue with current plan of care.      Pain No/Denies Pain   Therapy/Group: Individual Therapy  Blayn Whetsell 10/25/2019, 3:05 PM

## 2019-10-26 ENCOUNTER — Inpatient Hospital Stay (HOSPITAL_COMMUNITY): Payer: Medicare Other | Admitting: Occupational Therapy

## 2019-10-26 ENCOUNTER — Inpatient Hospital Stay (HOSPITAL_COMMUNITY): Payer: Medicare Other | Admitting: Physical Therapy

## 2019-10-26 DIAGNOSIS — S72491S Other fracture of lower end of right femur, sequela: Secondary | ICD-10-CM

## 2019-10-26 LAB — GLUCOSE, CAPILLARY
Glucose-Capillary: 110 mg/dL — ABNORMAL HIGH (ref 70–99)
Glucose-Capillary: 175 mg/dL — ABNORMAL HIGH (ref 70–99)
Glucose-Capillary: 138 mg/dL — ABNORMAL HIGH (ref 70–99)
Glucose-Capillary: 160 mg/dL — ABNORMAL HIGH (ref 70–99)

## 2019-10-26 NOTE — Progress Notes (Signed)
Occupational Therapy Session Note  Patient Details  Name: Tonya Hunter MRN: [id number] Date of Birth: 10-24-1934  Today's Date: 10/26/2019 OT Individual Time: 1330-1430 OT Individual Time Calculation (min): 60 min    Short Term Goals: Week 4:  OT Short Term Goal 1 (Week 4): patient will complete supine to/from sitting postion edge of bed with min/mod A and tolerate unsupported sitting fo 10 minutes daily OT Short Term Goal 2 (Week 4): patient will complete sit pivot transfers bed to/from w/c with mod/max A OT Short Term Goal 3 (Week 4): patient will complete UB dressing with min A  Skilled Therapeutic Interventions/Progress Updates:    Patient in bed, alert and pleasant today.  Husband not present for family education as requested.  She is able to move from side lying to sitting edge of bed with CS.  Sit to stand from bed surface with CGA.  Min/mod A for SPT bed to w/c with RW.  She completed oral care and brushed hair mod I seated at sink, moderate cues and min A for UB bathing,  She applied make up with min cues.  Donned [place] nightgown with CS.  SPT with RW back to bed with min A.  She is able to get LEs into bed with min a.  She soiled her brief upon return to bed requiring clean up and change of brief at dependent level.  She had second episode of loose stool again requiring dependent care to clean up and change brief.  She remained in bed at close of session, bed alarm set and call bell in reach.   Therapy Documentation Precautions:  Precautions Precautions: Fall Precaution Comments: Pt h/o confusion Required Braces or Orthoses: Other Brace Other Brace: AFO Restrictions Weight Bearing Restrictions: Yes RLE Weight Bearing: Weight bearing as tolerated Other Position/Activity Restrictions: RLE  Therapy/Group: Individual Therapy  Carlos Levering 10/26/2019, 7:39 AM
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
## 2019-10-26 NOTE — Plan of Care (Signed)
  Problem: RH BLADDER ELIMINATION Goal: RH STG MANAGE BLADDER WITH ASSISTANCE Description: STG Manage Bladder With max Assistance Outcome: Not Progressing; incontinence Problem: RH BOWEL ELIMINATION Goal: RH STG MANAGE BOWEL WITH ASSISTANCE Description: STG Manage Bowel with mod Assistance. Outcome: Not Progressing; incontinence

## 2019-10-26 NOTE — Progress Notes (Signed)
Physical Therapy Session Note  Patient Details  Name: Tonya Hunter MRN: [id number] Date of Birth: August 29, 1934  Today's Date: 10/26/2019 PT Individual Time: 1027-1152 PT Individual Time Calculation (min): 85 min   Short Term Goals: Week 5:  PT Short Term Goal 5 (Week 5): STG = LTGs due to ELOS  Skilled Therapeutic Interventions/Progress Updates:    Pt received R sidelying in bed with her husband present but exiting reporting he has to go to work. Pt agreeable to therapy session with encouragement from her husband. Supine>sitting R EOB with mod assist for trunk upright - the knee elevating function of pt's bed not working so therapist unable to make the bed flat while pt sitting at [place], RN notified to address this. Attempted sit>stand EOB>RW with heavy mod progressed to max assist for standing due to pt having strong posterior lean with feet too far forward - initiated R stand pivot transfer to w/c but becoming increasingly unsafe due to strong posterior lean and pt becoming flustered with significant fear of falling therefore returned to sitting EOB with max assist. Attempted L squat pivot to w/c with max assist of 1 but pt unable to clear hips and rotate again closing eyes and becoming severely afraid of falling repeatedly stating "I can't, I can't" and therapist unable to redirect for improved transfer therefore called for +2 assist to complete squat pivot safely.  Transported to/from gym in w/c for time management and energy conservation. Pt continues to require frequent redirection and encouragement to continue participating in therapy session often becoming irritable and upset starting to cry talking about how she has fallen before and how "terrible" it was but with redirection and emotional support able to continue with session. Therapist retrieved pt a more narrow RW for improved fit and increased UE support but during this pt states she is "NOT" going to use the walker. Therefore,  performed dependent sit<>stands using standing frame working on B LE weightbearing to tolerance, LE strengthening, and trunk control - performed x4 stands with pt tolerating supported standing up to 57mnutes 1x then at least 1-353mutes for other 3 trials. Transported to main therapy gym for change in scenery. Sit<>stands using stedy x3 with mod assist for lifting/lowering - tolerated standing in stedy for at least 1 minute each bout - attempted to engage pt in R UE reaching task to place horseshoes on basketball goal promoting improved trunk/hip/extension for upright standing but only performed 2 horseshoes before pt not agreeable to continue this task. Transported back to room. Stedy transfer w/c>EOB with mod assist for lifting/lowering during sit<>stand. Sit>supine with min assist for LE management. Pt left R semi-sidelying with needs in reach, bed alarm on, bedrails up, and telesitter in place.   Updated RN and NT about pt's updated R LE WBAT precaution and that pt can now perform +2 assist stedy transfers bed<>w/c with nursing staff and requested them to encourage pt to sit up in w/c for meals.  Therapy Documentation Precautions:  Precautions Precautions: Fall Precaution Comments: Pt h/o confusion Required Braces or Orthoses: Other Brace Other Brace: AFO Restrictions Weight Bearing Restrictions: Yes RLE Weight Bearing: Weight bearing as tolerated Other Position/Activity Restrictions: RLE  Pain:   Reports R LE pain during 1 of the stands and then some soreness at end of session but difficult to assess as pt tangential and not wanting to provide additional details when questioned - provided seated rest breaks, emotional support, distraction, and notified RN.    Therapy/Group: Individual Therapy  Tawana Scale, PT, DPT 10/26/2019, 8:08 AM

## 2019-10-27 ENCOUNTER — Inpatient Hospital Stay (HOSPITAL_COMMUNITY): Payer: Medicare Other

## 2019-10-27 ENCOUNTER — Inpatient Hospital Stay (HOSPITAL_COMMUNITY): Payer: Medicare Other | Admitting: Occupational Therapy

## 2019-10-27 DIAGNOSIS — K7682 Hepatic encephalopathy: Secondary | ICD-10-CM

## 2019-10-27 LAB — GLUCOSE, CAPILLARY
Glucose-Capillary: 133 mg/dL — ABNORMAL HIGH (ref 70–99)
Glucose-Capillary: 182 mg/dL — ABNORMAL HIGH (ref 70–99)
Glucose-Capillary: 135 mg/dL — ABNORMAL HIGH (ref 70–99)
Glucose-Capillary: 150 mg/dL — ABNORMAL HIGH (ref 70–99)

## 2019-10-27 LAB — SARS CORONAVIRUS 2 (TAT 6-24 HRS): SARS Coronavirus 2: NEGATIVE

## 2019-10-27 NOTE — Progress Notes (Addendum)
Physical Therapy Session Note  Patient Details  Name: Tonya Hunter MRN: [id number] Date of Birth: [birth year]  Today's Date: 10/27/2019 PT Individual Time: 1120-1220 PT Individual Time Calculation (min): 60 min   Short Term Goals: Week 5:  PT Short Term Goal 5 (Week 5): STG = LTGs due to ELOS  Skilled Therapeutic Interventions/Progress Updates:     Patient in bed upon PT arrival. Patient alert and agreeable to PT session. Patient reported 5-6/10 R LE pain during session, reported that she was pre-medicated prior to session and that she did not want to take any more pain meds due to feeling drowsy after taking them. PT provided repositioning, rest breaks, and distraction as pain interventions throughout session. Patient reported that she was actively voiding and having a BM at beginning of session. Declined the bed pain due to increased sacral pain when on it. Declined BSC due to feeling as though she had finished and wished to be cleaned up bed level.   Therapeutic Activity: Bed Mobility: Patient performed rolling R/L with supervision and using of bed rails. Required total A for peri-care and doffing/donning incontinence brief and donning pants. Patient intitaly refused to sit EOB. Discussed barriers to mobility and patient reported that she gets dizzy sometimes when sitting and more with standing. With increased encouragement and education, patient agreeable to assess orthostatic vitals, see below. She performed supine to/from sit with close supervision for safety with use of bed rails. Provided verbal cues for performing log roll technique to avoid additional sacral pain. Transfers: Patient performed sit to/from stand x1 with min A +2 without AD, patient felt more comfortable holding on to therapist rather than RW this session. Provided verbal cues for forward weight shift and increased hip and knee extension in standing.  Orthostatic Vitals: Supine: BP 114/67, HR 80 (asymptomatic) Sitting:  BP 92/62, HR 84 (symptomatic) Standing: BP 74/51, HR 86 (symptomatic) Standing x3 min: patient unable due to increased dizziness in standing and returned to supine in bed  Patient required increased time and education throughout session to encourage mobility. Patient became tearful x2 during session due to loss of independence and slow progress with recovery. Provided emotional support and encouraged patient to continue to participate with therapy. Discussed interventions that would help with dizziness with standing and patient very grateful. Patient with questions regarding sudden d/c and concerned about her husband being able to assist her. PT educated her about SNF placement and time for increased progress before d/c home. Patient requesting to speak with CSW again to clarify d/c. Will make CSW aware.  Patient in bed at end of session with breaks locked, bed alarm set, and all needs within reach.    Therapy Documentation Precautions:  Precautions Precautions: Fall Precaution Comments: Pt h/o confusion Required Braces or Orthoses: Other Brace Other Brace: AFO Restrictions Weight Bearing Restrictions: Yes RLE Weight Bearing: Weight bearing as tolerated Other Position/Activity Restrictions: RLE    Therapy/Group: Individual Therapy  Wrigley Plasencia L Charnika Herbst PT, DPT  10/27/2019, 1:11 PM

## 2019-10-27 NOTE — Progress Notes (Signed)
Physical Therapy Note  Patient Details  Name: Tonya Hunter MRN: [id number] Date of Birth: 02/07/1935 Today's Date: 10/27/2019    Patient reported history of dizziness with standing contrinbuting to fear of falling with mobility. Assessed orthostatic vitals during PT session, see progress note from today. Patient found to have symptomatic orthostatic BP with change of position.   Orthostatic Vitals: Supine: BP 114/67, HR 80 (asymptomatic) Sitting: BP 92/62, HR 84 (symptomatic) Standing: BP 74/51, HR 86 (symptomatic) Standing x3 min: patient unable due to increased dizziness in standing and returned to supine in bed   Jenille Laszlo L [place] PT, DPT  10/27/2019, 3:58 PM

## 2019-10-27 NOTE — Plan of Care (Signed)
  Problem: RH SKIN INTEGRITY Goal: RH STG SKIN FREE OF INFECTION/BREAKDOWN Description: Skin to remain free from infection and additional breakdown while on rehab with mod assistance. Outcome: not Progressing; stage 3 bottom

## 2019-10-27 NOTE — Progress Notes (Signed)
Occupational Therapy Session Note  Patient Details  Name: Tonya Hunter MRN: [id number] Date of Birth: 06-Feb-1935  Today's Date: 10/27/2019 OT Individual Time: 9444-6190 OT Individual Time Calculation (min): 61 min    Short Term Goals: Week 4:  OT Short Term Goal 1 (Week 4): patient will complete supine to/from sitting postion edge of bed with min/mod A and tolerate unsupported sitting fo 10 minutes daily OT Short Term Goal 2 (Week 4): patient will complete sit pivot transfers bed to/from w/c with mod/max A OT Short Term Goal 3 (Week 4): patient will complete UB dressing with min A  Skilled Therapeutic Interventions/Progress Updates:    Patient in bed, denies pain this am.  Supine to sitting edge of bed with CS.  Sit to stand CGA, mod A SPT to w/c with RW.  Grooming, oral care, UB bathing completed seated at sink with CS, increased time min cues for sequencing & thoroughness.  UB dressing CS.  LB bathing mod A, LB dressing mod A.  CM in stance max A.  Dependent to propel w/c to/from therapy gym  she declined exercise or balance activities.  Completed nail care with mod A.  SPT with RW w/c to bed with min A and cues for posture.  Sitting to supine with min A.  Bed alarm set and call bell in hand at close of session.    Therapy Documentation Precautions:  Precautions Precautions: Fall Precaution Comments: Pt h/o confusion Required Braces or Orthoses: Other Brace Other Brace: AFO Restrictions Weight Bearing Restrictions: Yes RLE Weight Bearing: Weight bearing as tolerated Other Position/Activity Restrictions: RLE   Therapy/Group: Individual Therapy  Carlos Levering 10/27/2019, 7:34 AM

## 2019-10-28 ENCOUNTER — Inpatient Hospital Stay (HOSPITAL_COMMUNITY): Payer: Medicare Other | Admitting: Occupational Therapy

## 2019-10-28 ENCOUNTER — Inpatient Hospital Stay (HOSPITAL_COMMUNITY): Payer: Medicare Other

## 2019-10-28 LAB — BASIC METABOLIC PANEL
Anion gap: 13 (ref 5–15)
BUN: 28 mg/dL — ABNORMAL HIGH (ref 8–23)
CO2: 24 mmol/L (ref 22–32)
Calcium: 9.2 mg/dL (ref 8.9–10.3)
Chloride: 98 mmol/L (ref 98–111)
Creatinine, Ser: 1.21 mg/dL — ABNORMAL HIGH (ref 0.44–1.00)
GFR calc Af Amer: 48 mL/min — ABNORMAL LOW (ref >60)
GFR calc non Af Amer: 41 mL/min — ABNORMAL LOW (ref >60)
Glucose, Bld: 154 mg/dL — ABNORMAL HIGH (ref 70–99)
Potassium: 4.1 mmol/L (ref 3.5–5.1)
Sodium: 135 mmol/L (ref 135–145)

## 2019-10-28 LAB — CBC
HCT: 40.2 % (ref 36.0–46.0)
Hemoglobin: 13.2 g/dL (ref 12.0–15.0)
MCH: 31.7 pg (ref 26.0–34.0)
MCHC: 32.8 g/dL (ref 30.0–36.0)
MCV: 96.6 fL (ref 80.0–100.0)
Platelets: 139 10*3/uL — ABNORMAL LOW (ref 150–400)
RBC: 4.16 MIL/uL (ref 3.87–5.11)
RDW: 15.8 % — ABNORMAL HIGH (ref 11.5–15.5)
WBC: 4.5 10*3/uL (ref 4.0–10.5)
nRBC: 0 % (ref 0.0–0.2)

## 2019-10-28 LAB — GLUCOSE, CAPILLARY
Glucose-Capillary: 123 mg/dL — ABNORMAL HIGH (ref 70–99)
Glucose-Capillary: 168 mg/dL — ABNORMAL HIGH (ref 70–99)
Glucose-Capillary: 120 mg/dL — ABNORMAL HIGH (ref 70–99)
Glucose-Capillary: 115 mg/dL — ABNORMAL HIGH (ref 70–99)

## 2019-10-28 MED ORDER — SPIRONOLACTONE 25 MG PO TABS
50.0000 mg | ORAL_TABLET | Freq: Two times a day (BID) | ORAL | Status: DC
  Administered 2019-10-29 (×2): 50 mg via ORAL
  Filled 2019-10-28 (×2): qty 2

## 2019-10-28 NOTE — Progress Notes (Signed)
No complaints of nausea and vomiting at this time.  Audie Clear, LPN
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
## 2019-10-28 NOTE — Progress Notes (Signed)
Occupational Therapy Session Note  Patient Details  Name: Tonya Hunter MRN: [id number] Date of Birth: 1935-02-06  Today's Date: 10/28/2019 OT Individual Time: 1130-1150  &  8270-7867 OT Individual Time Calculation (min): 20 min  &  52 min   Short Term Goals: Week 4:  OT Short Term Goal 1 (Week 4): patient will complete supine to/from sitting postion edge of bed with min/mod A and tolerate unsupported sitting fo 10 minutes daily OT Short Term Goal 2 (Week 4): patient will complete sit pivot transfers bed to/from w/c with mod/max A OT Short Term Goal 3 (Week 4): patient will complete UB dressing with min A  Skilled Therapeutic Interventions/Progress Updates:    AM session:   Patient seated in w/c - she states that she has been up since earlier PT session and is having pain at this time in her legs.  Completed SPT with RW w/c to bed with min A.  She is able to sit at edge of bed with CS for 4-5 minutes.  Sitting to supine with CS.  Assisted to remove bilateral LE ace wraps as compression is causing pain.  She helps to roll ace wraps up for future use.  She declined adl or other tasks at this time.  Offered to end this session early and make it up this afternoon - she was pleased with this option.   She remained in bed at close of session with bed alarm set and call bell in hand.     PM session:   Patient in bed, states that she ate some of her lunch.  She is agreeable to participate in therapy session and denies pain at this time.  Supine to sitting edge of bed with CS.  Sit to stand from bed surface min A - she maintains stance with CGA for 2-3 minutes, returned to sitting CGA.  She tolerated unsupported sitting edge of bed for 35 minutes with oral care, hair care, and light LB exercises.  Increased awareness noted this session but she continues to decline many activities as offered.  She returned to supine position with CS, min A to scoot up in bed.  Assisted with positioning of pillows.  Bed  alarm set and call bell in reach at close of session.      Therapy Documentation Precautions:  Precautions Precautions: Fall Precaution Comments: Pt h/o confusion Required Braces or Orthoses: Other Brace Other Brace: AFO Restrictions Weight Bearing Restrictions: Yes RLE Weight Bearing: Weight bearing as tolerated Other Position/Activity Restrictions: RLE   Therapy/Group: Individual Therapy  Carlos Levering 10/28/2019, 7:48 AM

## 2019-10-28 NOTE — Progress Notes (Signed)
Physical Therapy Session Note  Patient Details  Name: Tonya Hunter MRN: [id number] Date of Birth: [birth year]  Today's Date: 10/28/2019 PT Individual Time: 0802-0900 PT Individual Time Calculation (min): 58 min   Short Term Goals: Week 5:  PT Short Term Goal 5 (Week 5): STG = LTGs due to ELOS  Skilled Therapeutic Interventions/Progress Updates:     Pt received supine in bed and agreeable to therapy. No report of pain. Supine to sit with supervisoin for safety and sequencing. Pt's blood pressure while seated at EOB 92/57. Sit to stand and stand pivot to [facility] with modA and pt demonstrating anxiety and retropulsion in standing. WC transport to therapy gym for time management. PT wraps pt's BLEs with ace wraps and BP taken again at 116/58. Pt performs sit to stand with RW and minA and PT notes pt has been incontinent of bowel and bladder. Pt taken back to room. Performs sit to stand holding onto grab bar in bathroom with minA. PT performs pericare and brief change with pt in standing. WC return to gym. Pt performs x3 additional reps of sit to stand with minA and manual facilitation at hips for increased extension and anterior weight shift. Pt verbalizes dizziness each time she stands but is able to continue talking with PT for 1-2 minutes while standing, indicating that pt not likely to by syncopal from possible orthostasis. WC return to room and pt left seated in [facility] with alarm intact and all needs within reach.  Therapy Documentation Precautions:  Precautions Precautions: Fall Precaution Comments: Pt h/o confusion Required Braces or Orthoses: Other Brace Other Brace: AFO Restrictions Weight Bearing Restrictions: Yes RLE Weight Bearing: Weight bearing as tolerated Other Position/Activity Restrictions: RLE  Therapy/Group: Individual Therapy  Breck Coons, PT, DPT 10/28/2019, 12:35 PM

## 2019-10-28 NOTE — Progress Notes (Signed)
Patient ID: Tonya Hunter, female   DOB: Oct 15, 1934, 85 y.o.   MRN: [id number]  SW spoke with Tonya Hunter/Admissions with [facility] [phone]) who reported pt husband arrive at facility today at 8am to discuss referral. Reports they are unable to accept pt.   SW called pt husband Tonya Hunter [phone]) to inform on above. SW informed him he will have to select a location from the locations that have extended a bed offer. He is willing to explore [facility]. Sw informed him SW will call facility to see if they can accept referral, and will follow-up. Sw left message for Tonya Hunter/Admissions with [facility]a Hunter [phone]). Sw waiting on follow-up.   SW spoke with Tonya Hunter at [facility] [phone]) to discuss referral. Will review and will follow-up.  *Declined due to medication: Xifaxan. Per attending pt can not d/c medication.   SW left message for Tonya Hunter/Admissions with [place] [phone]) to discuss referral.   *SW spoke with [facility] which declined due to medication: Xifaxan.  Tonya Hunter/liasion with [employer]- will follow-up after review. * reports sister facility [facility] able to accept pt today.   SW spoke with Tonya Hunter with [facility]a [facility] [phone]) to discuss referral. States will have nursing review, and follow-up.   SW received message from [place] with Tonya Hunter [phone]) stating able to extend bed offer to patient.  *SW received message from pt husband Tonya Hunter with regard to [name]. SW followed up with Tonya Hunter to discuss referral. States pt has current auto claim open, and will be required to private pay for room and board until claim is closed. SW called pt husband Tonya Hunter to inform on above. He asked if Medicare would cover for the first 20 days. SW reiterated the issue above about the auto claim. Tonya Hunter is willing to private pay for room and board, and therapies. SW reiterated that  the facility will discuss payments that will need to be made.   Tonya Hunter, MSW, [place] Office: [phone] Cell: [phone] Fax: [phone]

## 2019-10-28 NOTE — Progress Notes (Signed)
Patient ID: Tonya Hunter, female   DOB: 1934/08/20, 85 y.o.   MRN: [id number]   SW followed up with pt dtr [name] [phone]) to inform on pt now being placement. SW indicated not being sure which facility but will provide updates once available.    [name], MSW, [place] Office: [phone] Cell: [phone] Fax: [phone]

## 2019-10-28 NOTE — Progress Notes (Signed)
Palliative Medicine RN Note: Discussed pt in team rounds.   Goals of care are clear at this time. PMT does not have a further role at this time. We will sign off.  If the patient requests to speak with [name] again, or if she has further decline, please re-consult [name].  Marjie Skiff Tami Barren, RN, BSN, [facility] Palliative Medicine Team 10/28/2019 10:17 AM Office [phone]

## 2019-10-28 NOTE — Progress Notes (Signed)
Patient ID: Tonya Hunter, female   DOB: 1934/11/22, 85 y.o.   MRN: [id number]   SW left message for pt dtr [name] to inform on SNF d/c location [facility]. SW indicated there will be confirmation of d/c, and/or if any changes.   [name], MSW, [place] Office: [phone] Cell: [phone] Fax: [phone]

## 2019-10-28 NOTE — Progress Notes (Signed)
Patient vomited x1, white yellow color. Moderate amount, zofran given through IV. No further complications noted at this time. Audie Clear, LPN

## 2019-10-29 ENCOUNTER — Inpatient Hospital Stay (HOSPITAL_COMMUNITY): Payer: Medicare Other

## 2019-10-29 ENCOUNTER — Inpatient Hospital Stay (HOSPITAL_COMMUNITY): Payer: Medicare Other | Admitting: Occupational Therapy

## 2019-10-29 LAB — GLUCOSE, CAPILLARY
Glucose-Capillary: 119 mg/dL — ABNORMAL HIGH (ref 70–99)
Glucose-Capillary: 173 mg/dL — ABNORMAL HIGH (ref 70–99)
Glucose-Capillary: 121 mg/dL — ABNORMAL HIGH (ref 70–99)

## 2019-10-29 MED ORDER — OXYCODONE HCL 5 MG PO TABS: 5.0000 mg | ORAL_TABLET | Freq: Three times a day (TID) | ORAL | 0 refills | Status: DC | PRN

## 2019-10-29 MED ORDER — ASCORBIC ACID 500 MG PO TABS: 500.0000 mg | ORAL_TABLET | Freq: Two times a day (BID) | ORAL | Status: AC

## 2019-10-29 MED ORDER — POLYETHYLENE GLYCOL 3350 17 G PO PACK: 17.0000 g | PACK | Freq: Every day | ORAL | 0 refills | Status: AC

## 2019-10-29 MED ORDER — BENEPROTEIN PO POWD: 1.0000 | Freq: Three times a day (TID) | ORAL | 0 refills | Status: AC

## 2019-10-29 MED ORDER — SPIRONOLACTONE 50 MG PO TABS: 50.0000 mg | ORAL_TABLET | Freq: Two times a day (BID) | ORAL | Status: DC

## 2019-10-29 MED ORDER — LACTULOSE 10 GM/15ML PO SOLN: 40.0000 g | Freq: Two times a day (BID) | ORAL | 0 refills | Status: DC

## 2019-10-29 MED ORDER — TORSEMIDE 20 MG PO TABS: 20.0000 mg | ORAL_TABLET | Freq: Every day | ORAL | Status: DC

## 2019-10-29 MED ORDER — OXYCODONE HCL ER 10 MG PO T12A: 10.0000 mg | EXTENDED_RELEASE_TABLET | Freq: Two times a day (BID) | ORAL | 0 refills | Status: DC

## 2019-10-29 MED ORDER — ZINC SULFATE 220 (50 ZN) MG PO CAPS: 220.0000 mg | ORAL_CAPSULE | Freq: Every day | ORAL | Status: AC

## 2019-10-29 MED ORDER — INSULIN GLARGINE 100 UNIT/ML ~~LOC~~ SOLN: 12.0000 [IU] | Freq: Every day | SUBCUTANEOUS | 11 refills | Status: AC

## 2019-10-29 MED ORDER — RIFAXIMIN 550 MG PO TABS: 550.0000 mg | ORAL_TABLET | Freq: Two times a day (BID) | ORAL | 0 refills | Status: AC

## 2019-10-29 MED ORDER — ALUM & MAG HYDROXIDE-SIMETH 200-200-20 MG/5ML PO SUSP: 30.0000 mL | ORAL | 0 refills | Status: AC | PRN

## 2019-10-29 MED ORDER — COLLAGENASE 250 UNIT/GM EX OINT: TOPICAL_OINTMENT | Freq: Every day | CUTANEOUS | 0 refills | Status: DC

## 2019-10-29 MED ORDER — ORAL CARE MOUTH RINSE: 15.0000 mL | Freq: Two times a day (BID) | OROMUCOSAL | 0 refills | Status: AC

## 2019-10-29 MED ORDER — TRAMADOL HCL 50 MG PO TABS: 50.0000 mg | ORAL_TABLET | Freq: Four times a day (QID) | ORAL | 0 refills | Status: AC | PRN
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
## 2019-10-29 NOTE — Progress Notes (Signed)
Physical Therapy Session Note  Patient Details  Name: Tonya Hunter MRN: [id number] Date of Birth: [birth year]  Today's Date: 10/29/2019 PT Individual Time: 7588-3254 PT Individual Time Calculation (min): 56 min   Short Term Goals: Week 5:  PT Short Term Goal 5 (Week 5): STG = LTGs due to ELOS  Skilled Therapeutic Interventions/Progress Updates:     Pt received supine and agreeable to therapy. No report of pain. Supervision for supine to sit with verbal cues for sequencing and hand placement. MinA for sit to stand from EOB and modA for stand pivot transfer to [facility] with HHA. WC transport to therapy gym for time management. Pt performs sit to stand with RW with miNA and ambulates 18' with modA +1 and +2 for WC follow. PT provides tactile cues at trunk and hips for increased extension and constant verbal cues for sequencing and to allay pt anxiety with redirection. Pt taken to perform car transfer but refuses to attempt, saying "now is not the time." PT encourages to at least attempt but pt continues to refuse. Pt taken back to room in [facility] and handed off to OT seated in WC.   Therapy Documentation Precautions:  Precautions Precautions: Fall Precaution Comments: Pt h/o confusion Required Braces or Orthoses: Other Brace Other Brace: AFO Restrictions Weight Bearing Restrictions: No RLE Weight Bearing: Weight bearing as tolerated Other Position/Activity Restrictions: RLE   Therapy/Group: Individual Therapy  Breck Coons, PT, DPT 10/29/2019, 4:23 PM
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
## 2019-10-29 NOTE — Progress Notes (Signed)
Patient ID: Tonya Hunter, female   DOB: April 16, 1935, 85 y.o.   MRN: [id number]   SW left message for pt dtr [name] to inform on SNF d/c location [place] with contact information for SNF. SW encouraged follow-up if needed.   [name], MSW, [place] Office: [phone] Cell: [phone] Fax: [phone]

## 2019-10-29 NOTE — Progress Notes (Signed)
[facility] has arrived to transfer pt to SNF. Report given, vitals w/in normal parameters, A&O x2/3. Pt belongings taken with her.  Will call spouse to inform him.

## 2019-10-29 NOTE — Progress Notes (Signed)
Occupational Therapy Discharge Summary  Patient Details  Name: Tonya Hunter MRN: [id number] Date of Birth: 03-Feb-1935   Patient has met 7 of 10 long term goals due to improved activity tolerance, improved balance, postural control and improved coordination.  Patient to discharge at overall Mod Assist level.  Patient's care partner requires assistance to provide the necessary physical and cognitive assistance at discharge.    Reasons goals not met: patient with significant medical issues limiting her ability to participate for the first weeks of rehab stay.  Recently she has become more engaged and tolerating OOB opportunities for therapy sessions.  Her functional stand pivot transfers are inconsistent but can be min Tonya Hunter with cues for posture and reassurance.  She has declined shower transfers and often refuses commode transfers.  She is incontinent of both bowel and bladder.    Recommendation:  Patient will benefit from ongoing skilled OT services in skilled nursing facility setting to continue to advance functional skills in the area of BADL.  Equipment: No equipment provided  Reasons for discharge: discharge from hospital  Patient/family agrees with progress made and goals achieved: Yes  OT Discharge Precautions/Restrictions  Precautions Precautions: Fall Precaution Comments: Pt h/o confusion Restrictions Weight Bearing Restrictions: No RLE Weight Bearing: Weight bearing as tolerated Other Position/Activity Restrictions: RLE   Pain Pain Assessment Pain Scale: 0-10 Pain Score: 0-No pain ADL ADL Eating: Set up Where Assessed-Eating: Bed level Grooming: Setup, Supervision/safety Where Assessed-Grooming: Sitting at sink, Wheelchair Upper Body Bathing: Supervision/safety Where Assessed-Upper Body Bathing: Sitting at sink, Wheelchair Lower Body Bathing: Moderate assistance Where Assessed-Lower Body Bathing: Sitting at sink, Wheelchair Upper Body Dressing:  Supervision/safety Where Assessed-Upper Body Dressing: Sitting at sink, Wheelchair Lower Body Dressing: Moderate assistance Where Assessed-Lower Body Dressing: Wheelchair Toileting: Dependent Where Assessed-Toileting: Bedside Commode Toilet Transfer: Moderate assistance Toilet Transfer Method: Stand pivot Science writer: Radiographer, therapeutic: Unable to assess Social research officer, government: Unable to assess  Vision Baseline Vision/History: Macular Degeneration Patient Visual Report: No change from baseline Perception  Perception: Within Functional Limits Praxis Praxis: Intact Cognition Overall Cognitive Status: Impaired/Different from baseline Arousal/Alertness: Awake/alert Orientation Level: Oriented to person;Oriented to place;Oriented to situation Memory Impairment: Decreased short term memory;Storage deficit Behaviors: Perseveration;Lability Safety/Judgment: Impaired Sensation Sensation Light Touch: Appears Intact Hot/Cold: Appears Intact Coordination Gross Motor Movements are Fluid and Coordinated: No Fine Motor Movements are Fluid and Coordinated: No Coordination and Movement Description: All movements limited by pain and fear of pain provocation Finger Nose Finger Test: Right mild dysmetria, left unable Motor  Motor Motor - Discharge Observations: posterior lean in stance, fear of falling, general deconditioning Mobility  Bed Mobility Rolling Right: Independent with assistive device Rolling Left: Independent with assistive device Supine to Sit: Supervision/Verbal cueing Sit to Supine: Supervision/Verbal cueing Transfers Sit to Stand: Contact Guard/Touching assist Stand to Sit: Minimal Assistance - Patient > 75%  Trunk/Postural Assessment  Postural Control Postural Control: Within Functional Limits  Balance Balance Balance Assessed: Yes Static Sitting Balance Static Sitting - Level of Assistance: 7: Independent Dynamic Sitting  Balance Dynamic Sitting - Level of Assistance: 6: Modified independent (Device/Increase time) Static Standing Balance Static Standing - Level of Assistance: 4: Min assist Dynamic Standing Balance Dynamic Standing - Level of Assistance: 3: Mod assist Extremity/Trunk Assessment RUE Assessment RUE Assessment: Within Functional Limits General Strength Comments: 4/5 LUE Assessment Passive Range of Motion (PROM) Comments: shoulder flexion/abd 3/4, wrist deformity chronic   Tonya Hunter Tonya Hunter Tonya Hunter 10/29/2019, 11:23 AM

## 2019-10-29 NOTE — Progress Notes (Signed)
Physical Therapy Discharge Summary  Patient Details  Name: Tonya Hunter MRN: [id number] Date of Birth: 08/11/1934   Patient has met 6 of 11 long term goals due to improved activity tolerance, improved balance, improved postural control, increased strength and decreased pain.  Patient to discharge at an ambulatory level Mod Assist.   Patient will discharge to SNF as her spouse is physically unable to provide safe assistance at discharge at this time.  Reasons goals not met: Pt discharging to SNF due to slow progress toward PT goals and unsafe for discharge home with husband at this time. Progress limited by nausea, emesis, and frequent patient refusals to participate.  Recommendation:  Patient will benefit from ongoing skilled PT services in skilled nursing facility setting to continue to advance safe functional mobility, address ongoing impairments in strength, bed mobility, balance, transfers, ambulation, and minimize fall risk.  Equipment: No equipment provided  Reasons for discharge: discharge from hospital  Patient/family agrees with progress made and goals achieved: Yes  PT Discharge Precautions/Restrictions Precautions Precautions: Fall Precaution Comments: Pt h/o confusion Restrictions Weight Bearing Restrictions: No RLE Weight Bearing: Weight bearing as tolerated Other Position/Activity Restrictions: RLE Vision/Perception  Perception Perception: Within Functional Limits Praxis Praxis: Intact  Cognition Overall Cognitive Status: Impaired/Different from baseline Arousal/Alertness: Awake/alert Orientation Level: Oriented to person;Oriented to place;Oriented to situation Safety/Judgment: Impaired Sensation Sensation Light Touch: Appears Intact Coordination Gross Motor Movements are Fluid and Coordinated: No Fine Motor Movements are Fluid and Coordinated: No Coordination and Movement Description: All movements limited by pain and fear of pain provocation Motor   Motor Motor: Within Functional Limits Motor - Discharge Observations: posterior lean in stance, fear of falling, general deconditioning  Mobility Bed Mobility Bed Mobility: Supine to Sit;Sit to Supine Supine to Sit: Supervision/Verbal cueing Sit to Supine: Supervision/Verbal cueing Transfers Transfers: Sit to Stand;Stand Pivot Transfers;Stand to Sit Sit to Stand: Minimal Assistance - Patient > 75% Stand to Sit: Minimal Assistance - Patient > 75% Stand Pivot Transfers: Moderate Assistance - Patient 50 - 74% Stand Pivot Transfer Details: Tactile cues for posture;Verbal cues for technique Transfer (Assistive device): 1 person hand held assist Locomotion  Gait Ambulation: Yes Gait Assistance: 2 Helpers Gait Distance (Feet): 18 Feet Assistive device: Rolling walker Gait Assistance Details: Tactile cues for posture;Verbal cues for sequencing;Verbal cues for technique;Tactile cues for sequencing Gait Gait: Yes Gait Pattern: Impaired Gait Pattern: Shuffle;Trunk flexed Gait velocity: extremely slow Stairs / Additional Locomotion Stairs: No Wheelchair Mobility Wheelchair Mobility: No  Trunk/Postural Assessment  Cervical Assessment Cervical Assessment: Exceptions to [facility] (forward head) Thoracic Assessment Thoracic Assessment: Exceptions to [facility] (rounded shoulders) Lumbar Assessment Lumbar Assessment: Exceptions to [facility] (posterior pelvic tilt in sitting) Postural Control Postural Control: Within Functional Limits  Balance Balance Balance Assessed: Yes Static Sitting Balance Static Sitting - Level of Assistance: 7: Independent Dynamic Sitting Balance Dynamic Sitting - Level of Assistance: 6: Modified independent (Device/Increase time) Static Standing Balance Static Standing - Level of Assistance: 4: Min assist Dynamic Standing Balance Dynamic Standing - Level of Assistance: 3: Mod assist Extremity Assessment  RLE Assessment RLE Assessment: Exceptions to [facility] General Strength  Comments: Grossly 3+/5 LLE Assessment LLE Assessment: Exceptions to [facility] General Strength Comments: Grossly 4/5    Breck Coons, PT, DPT 10/29/2019, 7:56 AM

## 2019-10-29 NOTE — Progress Notes (Signed)
Patient ID: Tonya Hunter, female   DOB: Oct 28, 1934, 85 y.o.   MRN: [id number]  SW spoke with Shana/Admissions with [facility] [phone]) to discuss if there were any updates. She reported that patient's husband was planning to visit today to discuss placement with her further. Will follow-up with SW.  *SW returned phone call to [facility] who reported that patient's husband indicated that he would be coming back at 1pm to make payments for placement. Pt will need an Rx for Xifaxan as medication is expensive. Also states unable to accept pt after 8pm. If pt does not d/c today, pt will need another COVID test. SW met with pt husband to inform on above. Patient husband plans to pick up Rx and go to facility. SW waiting on updates from facility to ensure d/c is possible for today.   [facility] ambulance pick up scheduled for 4:30pm today. SW informed pt assigned RN and medical team on above.   *SW spoke with [facility], and pt will d/c today. Nurse report to [phone]; Rm [id number].   [name], MSW, [place] Office: [phone] Cell: [phone] Fax: [phone]

## 2019-10-29 NOTE — Progress Notes (Signed)
Occupational Therapy Session Note  Patient Details  Name: Tonya Hunter MRN: [id number] Date of Birth: [birth year]  Today's Date: 10/29/2019 OT Individual Time: 1015-1101 OT Individual Time Calculation (min): 46 min    Short Term Goals: Week 4:  OT Short Term Goal 1 (Week 4): patient will complete supine to/from sitting postion edge of bed with min/mod A and tolerate unsupported sitting fo 10 minutes daily OT Short Term Goal 2 (Week 4): patient will complete sit pivot transfers bed to/from w/c with mod/max A OT Short Term Goal 3 (Week 4): patient will complete UB dressing with min A  Skilled Therapeutic Interventions/Progress Updates:    Patient seated in w/c after PT session.  She denies pain at this time and is agreeable to washing and dressing.  Completed oral care with supervision.  UB bathing and dressing with CS and cues.  Shower shampoo cap with mod A.  She is able to brush hair independently.  She applies make up with CS.  LB bathing and dressing mod A.  Non-skid slipper socks dependent.    Incontinent of urine and small amount of bowel in brief - she is dependent for pants down/up and hygiene in stance with RW.  SPT with RW w/c to bed with min A, cues for posture and posterior lean.  Sitting to supine with CS.  She is able to roll in bed independently.  Bed alarm set and callbell/tray table and phone in reach at close of session.   Therapy Documentation Precautions:  Precautions Precautions: Fall Precaution Comments: Pt h/o confusion Required Braces or Orthoses: Other Brace Other Brace: AFO Restrictions Weight Bearing Restrictions: No RLE Weight Bearing: Weight bearing as tolerated Other Position/Activity Restrictions: RLE General:   Vital Signs:   Pain: Pain Assessment Pain Scale: 0-10 Pain Score: 0-No pain   Therapy/Group: Individual Therapy  Carlos Levering 10/29/2019, 11:06 AM

## 2019-10-29 NOTE — Progress Notes (Signed)
Speech Language Pathology Daily Session Note  Patient Details  Name: Tonya Hunter MRN: [id number] Date of Birth: Apr 13, 1935  Today's Date: 10/29/2019 SLP Individual Time: 1400-1440 SLP Individual Time Calculation (min): 40 min  Short Term Goals: Week 5: SLP Short Term Goal 1 (Week 5): Patient will demonstrate sustained attention to functional tasks for ~10 minutes with Mod verbal cues for redirection. SLP Short Term Goal 2 (Week 5): Patient will recall new, daily information with use of external aids with Max A verbal cues. SLP Short Term Goal 3 (Week 5): Patient will identify 2 physical and 2 cognitive changes with Max A verbal cues. SLP Short Term Goal 4 (Week 5): Patient will demonstrate functional problem solving with Mod A verbal cues.  Skilled Therapeutic Interventions: Skilled ST services focused on cognitive skills. Pt was orientated to place and time with mod A verbal cues for use of calendar. Pt was preservative on locating car title at house and required constant redirection during session, which eventually lead to pt requesting that treatment end early. Pt stated " I can't be bother with this now" and SLP provided emotional support. SLP was able to facilitate basic problem solving skills in 3 step card sequence task for a short period of time, pt was able to return demonstration in 1 out 4 trials with max A verbal cues. Pt was left in room with call bell within reach and bed alarm set. ST recommends to continue skilled ST services.      Pain Pain Assessment Pain Scale: 0-10 Pain Score: 0-No pain  Therapy/Group: Individual Therapy  Lillion Elbert  [facility] 10/29/2019, 12:56 PM

## 2019-10-30 ENCOUNTER — Telehealth: Payer: Self-pay | Admitting: *Deleted

## 2019-10-30 NOTE — Progress Notes (Signed)
Speech Language Pathology Discharge Summary  Patient Details  Name: Tonya Hunter MRN: [id number] Date of Birth: 1934/07/03   Patient has met 4 of 4 long term goals.  Patient to discharge at overall Max level.   Reasons goals not met: N/A   Clinical Impression/Discharge Summary: Patient has made slow progress and has met 4 of 4 LTGs this reporting period. Currently, patient requires overall Max A multimodal cues to complete functional and familiar tasks safely in regards to initiation, sustained attention, basic recall with use of external aids, basic problem solving, and intellectual awareness of deficits. Patient and family education is complete, however, patient's husband is unable to provide the necessary physical and cognitive assistance needed at this time, therefore, patient will discharge to a SNF. Patient would benefit from f/u SLP services to maximize her cognitive functioning and overall functional independence in order to reduce burden of care.   Care Partner:  Caregiver Able to Provide Assistance: No  Type of Caregiver Assistance: Physical;Cognitive  Recommendation:  Skilled Nursing facility  Rationale for SLP Follow Up: Reduce caregiver burden;Maximize cognitive function and independence   Equipment: N/A   Reasons for discharge: Discharged from hospital;Treatment goals met   Patient/Family Agrees with Progress Made and Goals Achieved: Yes    [place], [place] 10/30/2019, 6:43 AM

## 2019-10-30 NOTE — Telephone Encounter (Signed)
Prior auth for Tonya Hunter was approved through 05/15/21.

## 2019-10-31 NOTE — Progress Notes (Signed)
Inpatient Rehabilitation Care Coordinator  Discharge Note  The overall goal for the admission was met for:   Discharge location: Yes. D/c to SNF -[facility] (private pay).  Length of Stay: Yes. 35 days  Discharge activity level: Yes. Max A  Home/community participation: Yes. Limited  Services provided included: MD, RD, PT, OT, SLP, RN, CM, TR, Pharmacy, Neuropsych and SW  Financial Services: Medicare and Private Insurance: BCBS  Follow-up services arranged: N/A  Comments (or additional information):contact pt husband Charlie #[phone]  Patient/Family verbalized understanding of follow-up arrangements: Yes  Individual responsible for coordination of the follow-up plan: N/A  Confirmed correct DME delivered:  A  10/31/2019     A  

## 2019-11-06 DIAGNOSIS — I10 Essential (primary) hypertension: Secondary | ICD-10-CM | POA: Diagnosis not present

## 2019-11-06 DIAGNOSIS — E11621 Type 2 diabetes mellitus with foot ulcer: Secondary | ICD-10-CM | POA: Diagnosis not present

## 2019-11-06 DIAGNOSIS — I739 Peripheral vascular disease, unspecified: Secondary | ICD-10-CM | POA: Diagnosis not present

## 2019-11-06 DIAGNOSIS — E1169 Type 2 diabetes mellitus with other specified complication: Secondary | ICD-10-CM | POA: Diagnosis not present

## 2019-11-12 NOTE — Progress Notes (Signed)
Tonya, Hunter ([id number]) Visit Report for 07/09/2019 Arrival Information Details Patient Name: Date of Service: Tonya Hunter, Tonya Hunter 07/09/2019 3:45 PM Medical Record Number: [id number] Patient Account Number: [id number] Date of Birth/Sex: Treating RN: [birth year] (85 y.o. Tonya Hunter, Tonya Hunter Primary Care Tamani Durney: [name] Other Clinician: Referring Leaann Nevils: Treating Jonaya Freshour/Extender: Peter Garter, [name] in Treatment: 6 Visit Information History Since Last Visit All ordered tests and consults were completed: Yes Patient Arrived: [name] Added or deleted any medications: No Arrival Time: 16:52 Any new allergies or adverse reactions: No Accompanied By: self Had a fall or experienced change in No Transfer Assistance: None activities of daily living that may affect Patient Identification Verified: Yes risk of falls: Secondary Verification Process Completed: Yes Signs or symptoms of abuse/neglect since last visito No Patient Requires Transmission-Based Precautions: No Hospitalized since last visit: No Patient Has Alerts: Yes Implantable device outside of the clinic excluding No Patient Alerts: Patient on Blood Thinner cellular tissue based products placed in the center L ABI non compressible since last visit: Has Dressing in Place as Prescribed: Yes Pain Present Now: No Electronic Signature(s) Signed: 07/09/2019 6:22:34 PM By: [name] RN, BSN Entered By: [name] on 07/09/2019 16:53:04 -------------------------------------------------------------------------------- Clinic Level of Care Assessment Details Patient Name: Date of Service: [name]. 07/09/2019 3:45 PM Medical Record Number: [id number] Patient Account Number: [id number] Date of Birth/Sex: Treating RN: Jun 10, 1934 (85 y.o. Tonya Hunter Primary Care Tonya Hunter: [name] Other Clinician: Referring Tonya Hunter: Treating Tonya Hunter/Extender: Peter Garter, [name] in Treatment: 6 Clinic Level of Care Assessment Items TOOL 4 Quantity Score X- 1 0 Use when only an EandM is performed on FOLLOW-UP visit ASSESSMENTS - Nursing Assessment / Reassessment X- 1 10 Reassessment of Co-morbidities (includes updates in patient status) X- 1 5 Reassessment of Adherence to Treatment Plan ASSESSMENTS - Wound and Skin A ssessment / Reassessment X - Simple Wound Assessment / Reassessment - one wound 1 5 []  - 0 Complex Wound Assessment / Reassessment - multiple wounds []  - 0 Dermatologic / Skin Assessment (not related to wound area) ASSESSMENTS - Focused Assessment []  - 0 Circumferential Edema Measurements - multi extremities []  - 0 Nutritional Assessment / Counseling / Intervention []  - 0 Lower Extremity Assessment (monofilament, tuning fork, pulses) []  - 0 Peripheral Arterial Disease Assessment (using hand held doppler) ASSESSMENTS - Ostomy and/or Continence Assessment and Care []  - 0 Incontinence Assessment and Management []  - 0 Ostomy Care Assessment and Management (repouching, etc.) PROCESS - Coordination of Care X - Simple Patient / Family Education for ongoing care 1 15 []  - 0 Complex (extensive) Patient / Family Education for ongoing care X- 1 10 Staff obtains Programmer, systems, Records, T Results / Process Orders est []  - 0 Staff telephones HHA, Nursing Homes / Clarify orders / etc []  - 0 Routine Transfer to another Facility (non-emergent condition) []  - 0 Routine Hospital Admission (non-emergent condition) []  - 0 New Admissions / Biomedical engineer / Ordering NPWT Apligraf, etc. , []  - 0 Emergency Hospital Admission (emergent condition) X- 1 10 Simple Discharge Coordination []  - 0 Complex (extensive) Discharge Coordination PROCESS - Special Needs []  - 0 Pediatric / Minor Patient Management []  - 0 Isolation Patient Management []  - 0 Hearing / Language / Visual special needs []  - 0 Assessment of  Community assistance (transportation, D/C planning, etc.) []  - 0 Additional assistance / Altered mentation []  - 0 Support Surface(s) Assessment (bed, cushion, seat, etc.) INTERVENTIONS -  Wound Cleansing / Measurement X - Simple Wound Cleansing - one wound 1 5 []  - 0 Complex Wound Cleansing - multiple wounds X- 1 5 Wound Imaging (photographs - any number of wounds) []  - 0 Wound Tracing (instead of photographs) X- 1 5 Simple Wound Measurement - one wound []  - 0 Complex Wound Measurement - multiple wounds INTERVENTIONS - Wound Dressings []  - 0 Small Wound Dressing one or multiple wounds X- 1 15 Medium Wound Dressing one or multiple wounds []  - 0 Large Wound Dressing one or multiple wounds X- 1 5 Application of Medications - topical []  - 0 Application of Medications - injection INTERVENTIONS - Miscellaneous []  - 0 External ear exam []  - 0 Specimen Collection (cultures, biopsies, blood, body fluids, etc.) []  - 0 Specimen(s) / Culture(s) sent or taken to Lab for analysis []  - 0 Patient Transfer (multiple staff / Civil Service fast streamer / Similar devices) []  - 0 Simple Staple / Suture removal (25 or less) []  - 0 Complex Staple / Suture removal (26 or more) []  - 0 Hypo / Hyperglycemic Management (close monitor of Blood Glucose) []  - 0 Ankle / Brachial Index (ABI) - do not check if billed separately X- 1 5 Vital Signs Has the patient been seen at the hospital within the last three years: Yes Total Score: 95 Level Of Care: New/Established - Level 3 Electronic Signature(s) Signed: 07/09/2019 6:06:02 PM By: [name] RN Entered By: [name] on 07/09/2019 17:52:01 -------------------------------------------------------------------------------- Encounter Discharge Information Details Patient Name: Date of Service: [name]. 07/09/2019 3:45 PM Medical Record Number: [id number] Patient Account Number: [id number] Date of Birth/Sex: Treating RN: February 16, 1935 (85 y.o. Tonya Hunter Primary Care Breleigh Carpino: [name] Other Clinician: Referring Giulia Hickey: Treating Linkyn Gobin/Extender: Peter Garter, [name] in Treatment: 6 Encounter Discharge Information Items Discharge Condition: Stable Ambulatory Status: Ambulatory Discharge Destination: Home Transportation: Private Auto Accompanied By: alone Schedule Follow-up Appointment: Yes Clinical Summary of Care: Patient Declined Electronic Signature(s) Signed: 07/11/2019 8:56:01 AM By: Levan Hurst RN, BSN Entered By: Levan Hurst on 07/09/2019 17:50:05 -------------------------------------------------------------------------------- Lower Extremity Assessment Details Patient Name: Date of Service: [name]. 07/09/2019 3:45 PM Medical Record Number: [id number] Patient Account Number: [id number] Date of Birth/Sex: Treating RN: [birth year] (85 y.o. [name] Primary Care Birttany Dechellis: [name] Other Clinician: Referring Selby Slovacek: Treating Jonda Alanis/Extender: Peter Garter, [name] in Treatment: 6 Edema Assessment Assessed: Shirlyn Goltz: No] [Right: No] Edema: [Left: Ye] [Right: s] Calf Left: Right: Point of Measurement: 31 cm From Medial Instep cm 37.5 cm Ankle Left: Right: Point of Measurement: 9 cm From Medial Instep cm 26.2 cm Vascular Assessment Pulses: Dorsalis Pedis Palpable: [Right:Yes] Electronic Signature(s) Signed: 07/09/2019 6:22:34 PM By: [name] RN, BSN Entered By: [name] on 07/09/2019 16:58:03 -------------------------------------------------------------------------------- Multi Wound Chart Details Patient Name: Date of Service: [name]. 07/09/2019 3:45 PM Medical Record Number: [id number] Patient Account Number: [id number] Date of Birth/Sex: Treating RN: 31-Oct-1934 (85 y.o. Tonya Hunter Primary Care Kaycie Pegues: [name] Other Clinician: Referring Iren Whipp: Treating Lova Urbieta/Extender:  Peter Garter, [name] in Treatment: 6 Vital Signs Height(in): 66 Capillary Blood Glucose(mg/dl): 162 Weight(lbs): 164 Pulse(bpm): 37 Body Mass Index(BMI): 26 Blood Pressure(mmHg): 151/86 Temperature(F): 97.7 Respiratory Rate(breaths/min): 18 Photos: [2:No Photos Right T Great - Medial oe] [N/A:N/A N/A] Wound Location: [2:Blister] [N/A:N/A] Wounding Event: [2:Diabetic Wound/Ulcer of the Lower] [N/A:N/A] Primary Etiology: [2:Extremity Cataracts, Anemia, Angina,] [N/A:N/A] Comorbid History: [2:Congestive Heart Failure, Coronary Artery  Disease, Hypertension, Myocardial Infarction, Type II Diabetes, Osteoarthritis, Neuropathy 05/22/2019] [N/A:N/A] Date Acquired: [2:6] [N/A:N/A] Weeks of Treatment: [2:Open] [N/A:N/A] Wound Status: [2:0.9x0.9x0.3] [N/A:N/A] Measurements L x W x D (cm) [2:0.636] [N/A:N/A] A (cm) : rea [2:0.191] [N/A:N/A] Volume (cm) : [2:91.20%] [N/A:N/A] % Reduction in A rea: [2:73.60%] [N/A:N/A] % Reduction in Volume: [2:11] Starting Position 1 (o'clock): [2:5] Ending Position 1 (o'clock): [2:0.3] Maximum Distance 1 (cm): [2:Yes] [N/A:N/A] Undermining: [2:Grade 2] [N/A:N/A] Classification: [2:Medium] [N/A:N/A] Exudate A mount: [2:Serosanguineous] [N/A:N/A] Exudate Type: [2:red, brown] [N/A:N/A] Exudate Color: [2:Thickened] [N/A:N/A] Wound Margin: [2:Small (1-33%)] [N/A:N/A] Granulation A mount: [2:Pink, Friable] [N/A:N/A] Granulation Quality: [2:Large (67-100%)] [N/A:N/A] Necrotic A mount: [2:Fat Layer (Subcutaneous Tissue)] [N/A:N/A] Exposed Structures: [2:Exposed: Yes Fascia: No Tendon: No Muscle: No Joint: No Bone: No None] [N/A:N/A] Treatment Notes Wound #2 (Right, Medial Toe Great) 1. Cleanse With Wound Cleanser 3. Primary Dressing Applied Calcium Alginate Ag 4. Secondary Dressing Dry Gauze Roll Gauze Foam 5. Secured With Tape Notes foam donut, netting. Electronic Signature(s) Signed: 07/09/2019 6:00:34 PM By: Linton Ham  MD Signed: 07/09/2019 6:06:02 PM By: [name] RN Entered By: Linton Ham on 07/09/2019 17:51:05 -------------------------------------------------------------------------------- Multi-Disciplinary Care Plan Details Patient Name: Date of Service: [name]. 07/09/2019 3:45 PM Medical Record Number: [id number] Patient Account Number: [id number] Date of Birth/Sex: Treating RN: Jun 06, 1934 (85 y.o. Tonya Hunter Primary Care Herma Uballe: [name] Other Clinician: Referring Donta Mcinroy: Treating Elfie Costanza/Extender: Peter Garter, [name] in Treatment: 6 Active Inactive Abuse / Safety / Falls / Self Care Management Nursing Diagnoses: Potential for falls Goals: Patient/caregiver will verbalize/demonstrate measures taken to prevent injury and/or falls Date Initiated: 05/22/2019 Target Resolution Date: 07/17/2019 Goal Status: Active Interventions: Assess fall risk on admission and as needed Assess impairment of mobility on admission and as needed per policy Assess personal safety and home safety (as indicated) on admission and as needed Notes: Nutrition Nursing Diagnoses: Impaired glucose control: actual or potential Goals: Patient/caregiver will maintain therapeutic glucose control Date Initiated: 05/22/2019 Target Resolution Date: 07/17/2019 Goal Status: Active Interventions: Assess HgA1c results as ordered upon admission and as needed Assess patient nutrition upon admission and as needed per policy Provide education on elevated blood sugars and impact on wound healing Treatment Activities: Patient referred to Primary Care Physician for further nutritional evaluation : 05/22/2019 Notes: Wound/Skin Impairment Nursing Diagnoses: Impaired tissue integrity Knowledge deficit related to ulceration/compromised skin integrity Goals: Patient/caregiver will verbalize understanding of skin care regimen Date Initiated: 05/22/2019 Target Resolution Date:  07/17/2019 Goal Status: Active Ulcer/skin breakdown will have a volume reduction of 30% by week 4 Date Initiated: 05/22/2019 Date Inactivated: 06/19/2019 Target Resolution Date: 06/19/2019 Goal Status: Met Ulcer/skin breakdown will have a volume reduction of 50% by week 8 Date Initiated: 06/19/2019 Target Resolution Date: 07/17/2019 Goal Status: Active Interventions: Assess patient/caregiver ability to obtain necessary supplies Assess patient/caregiver ability to perform ulcer/skin care regimen upon admission and as needed Assess ulceration(s) every visit Provide education on ulcer and skin care Treatment Activities: Skin care regimen initiated : 05/22/2019 Topical wound management initiated : 05/22/2019 Notes: Electronic Signature(s) Signed: 07/09/2019 6:06:02 PM By: [name] RN Entered By: [name] on 07/09/2019 16:18:21 -------------------------------------------------------------------------------- Pain Assessment Details Patient Name: Date of Service: [name]. 07/09/2019 3:45 PM Medical Record Number: [id number] Patient Account Number: [id number] Date of Birth/Sex: Treating RN: [birth year] (85 y.o. [name] Primary Care Desirae Mancusi: [name] Other Clinician: Referring Elizebeth Kluesner: Treating Raliyah Montella/Extender: Peter Garter, [name] in Treatment: 6 Active  Problems Location of Pain Severity and Description of Pain Patient Has Paino No Site Locations Rate the pain. Current Pain Level: 0 Pain Management and Medication Current Pain Management: Electronic Signature(s) Signed: 07/09/2019 6:22:34 PM By: [name] RN, BSN Entered By: [name] on 07/09/2019 16:56:30 -------------------------------------------------------------------------------- Patient/Caregiver Education Details Patient Name: Date of Service: [name] 2/23/2021andnbsp3:45 PM Medical Record Number: [id number] Patient Account Number: [id number] Date of  Birth/Gender: Treating RN: Oct 28, 1934 (85 y.o. Tonya Hunter Primary Care Physician: [name] Other Clinician: Referring Physician: Treating Physician/Extender: Inetta Fermo in Treatment: 6 Education Assessment Education Provided To: Patient Education Topics Provided Elevated Blood Sugar/ Impact on Healing: Methods: Explain/Verbal Responses: State content correctly Electronic Signature(s) Signed: 07/09/2019 6:06:02 PM By: [name] RN Entered By: [name] on 07/09/2019 16:18:38 -------------------------------------------------------------------------------- Wound Assessment Details Patient Name: Date of Service: [name]. 07/09/2019 3:45 PM Medical Record Number: [id number] Patient Account Number: [id number] Date of Birth/Sex: Treating RN: [birth year] (85 y.o. Tonya Hunter Primary Care Devine Dant: [name] Other Clinician: Referring Rakeya Glab: Treating Shyheim Tanney/Extender: Peter Garter, [name] in Treatment: 6 Wound Status Wound Number: 2 Primary Diabetic Wound/Ulcer of the Lower Extremity Etiology: Wound Location: Right T Great - Medial oe Wound Open Wounding Event: Blister Status: Date Acquired: 05/22/2019 Comorbid Cataracts, Anemia, Angina, Congestive Heart Failure, Coronary Weeks Of Treatment: 6 History: Artery Disease, Hypertension, Myocardial Infarction, Type II Clustered Wound: No Diabetes, Osteoarthritis, Neuropathy Photos Wound Measurements Length: (cm) 0.9 Width: (cm) 0.9 Depth: (cm) 0.3 Area: (cm) 0.636 Volume: (cm) 0.191 % Reduction in Area: 91.2% % Reduction in Volume: 73.6% Epithelialization: None Tunneling: No Undermining: Yes Starting Position (o'clock): 11 Ending Position (o'clock): 5 Maximum Distance: (cm) 0.3 Wound Description Classification: Grade 2 Wound Margin: Thickened Exudate Amount: Medium Exudate Type: Serosanguineous Exudate Color: red,  brown Foul Odor After Cleansing: No Slough/Fibrino Yes Wound Bed Granulation Amount: Small (1-33%) Exposed Structure Granulation Quality: Pink, Friable Fascia Exposed: No Necrotic Amount: Large (67-100%) Fat Layer (Subcutaneous Tissue) Exposed: Yes Necrotic Quality: Adherent Slough Tendon Exposed: No Muscle Exposed: No Joint Exposed: No Bone Exposed: No Electronic Signature(s) Signed: 07/10/2019 5:09:01 PM By: Mikeal Hawthorne EMT/HBOT Signed: 11/12/2019 5:26:37 PM By: [name] RN Previous Signature: 07/09/2019 6:22:34 PM Version By: [name] RN, BSN Entered By: Mikeal Hawthorne on 07/10/2019 13:38:28 -------------------------------------------------------------------------------- Vitals Details Patient Name: Date of Service: Letitia Neri S. 07/09/2019 3:45 PM Medical Record Number: [id number] Patient Account Number: [id number] Date of Birth/Sex: Treating RN: April 03, 1935 (85 y.o. [name] Primary Care Sharalee Witman: [name] Other Clinician: Referring Aubree Doody: Treating Taliyah Watrous/Extender: Peter Garter, [name] in Treatment: 6 Vital Signs Time Taken: 16:55 Temperature (F): 97.7 Height (in): 66 Pulse (bpm): 70 Source: Stated Respiratory Rate (breaths/min): 18 Weight (lbs): 164 Blood Pressure (mmHg): 151/86 Source: Stated Capillary Blood Glucose (mg/dl): 162 Body Mass Index (BMI): 26.5 Reference Range: 80 - 120 mg / dl Notes glucose per pt report last night Electronic Signature(s) Signed: 07/09/2019 6:22:34 PM By: [name] RN, BSN Entered By: [name] on 07/09/2019 16:56:22

## 2019-11-26 DIAGNOSIS — K3184 Gastroparesis: Secondary | ICD-10-CM | POA: Diagnosis not present

## 2019-11-26 DIAGNOSIS — S7291XD Unspecified fracture of right femur, subsequent encounter for closed fracture with routine healing: Secondary | ICD-10-CM | POA: Diagnosis not present

## 2019-11-26 DIAGNOSIS — R110 Nausea: Secondary | ICD-10-CM | POA: Diagnosis not present

## 2019-11-26 DIAGNOSIS — E1143 Type 2 diabetes mellitus with diabetic autonomic (poly)neuropathy: Secondary | ICD-10-CM | POA: Diagnosis not present

## 2019-12-01 ENCOUNTER — Encounter (HOSPITAL_COMMUNITY): Payer: Self-pay | Admitting: *Deleted

## 2019-12-01 ENCOUNTER — Inpatient Hospital Stay (HOSPITAL_COMMUNITY)
Admission: EM | Admit: 2019-12-01 | Discharge: 2019-12-06 | DRG: 683 | Disposition: A | Discharge status: Home Health | Payer: Medicare Other | Attending: Internal Medicine | Admitting: Internal Medicine

## 2019-12-01 ENCOUNTER — Emergency Department (HOSPITAL_COMMUNITY): Payer: Medicare Other

## 2019-12-01 ENCOUNTER — Other Ambulatory Visit: Payer: Self-pay

## 2019-12-01 DIAGNOSIS — R54 Age-related physical debility: Secondary | ICD-10-CM | POA: Diagnosis present

## 2019-12-01 DIAGNOSIS — E872 Acidosis: Secondary | ICD-10-CM | POA: Diagnosis present

## 2019-12-01 DIAGNOSIS — E785 Hyperlipidemia, unspecified: Secondary | ICD-10-CM | POA: Diagnosis present

## 2019-12-01 DIAGNOSIS — R0902 Hypoxemia: Secondary | ICD-10-CM | POA: Diagnosis not present

## 2019-12-01 DIAGNOSIS — Z9049 Acquired absence of other specified parts of digestive tract: Secondary | ICD-10-CM

## 2019-12-01 DIAGNOSIS — I4510 Unspecified right bundle-branch block: Secondary | ICD-10-CM | POA: Diagnosis not present

## 2019-12-01 DIAGNOSIS — Z20822 Contact with and (suspected) exposure to covid-19: Secondary | ICD-10-CM | POA: Diagnosis present

## 2019-12-01 DIAGNOSIS — Z79899 Other long term (current) drug therapy: Secondary | ICD-10-CM

## 2019-12-01 DIAGNOSIS — W19XXXA Unspecified fall, initial encounter: Secondary | ICD-10-CM | POA: Diagnosis not present

## 2019-12-01 DIAGNOSIS — I639 Cerebral infarction, unspecified: Secondary | ICD-10-CM | POA: Diagnosis not present

## 2019-12-01 DIAGNOSIS — E119 Type 2 diabetes mellitus without complications: Secondary | ICD-10-CM | POA: Diagnosis not present

## 2019-12-01 DIAGNOSIS — Z66 Do not resuscitate: Secondary | ICD-10-CM | POA: Diagnosis present

## 2019-12-01 DIAGNOSIS — I10 Essential (primary) hypertension: Secondary | ICD-10-CM | POA: Diagnosis not present

## 2019-12-01 DIAGNOSIS — K7581 Nonalcoholic steatohepatitis (NASH): Secondary | ICD-10-CM | POA: Diagnosis present

## 2019-12-01 DIAGNOSIS — R531 Weakness: Secondary | ICD-10-CM | POA: Diagnosis not present

## 2019-12-01 DIAGNOSIS — Z79890 Hormone replacement therapy (postmenopausal): Secondary | ICD-10-CM

## 2019-12-01 DIAGNOSIS — R197 Diarrhea, unspecified: Secondary | ICD-10-CM | POA: Diagnosis not present

## 2019-12-01 DIAGNOSIS — N1832 Chronic kidney disease, stage 3b: Secondary | ICD-10-CM | POA: Diagnosis present

## 2019-12-01 DIAGNOSIS — D696 Thrombocytopenia, unspecified: Secondary | ICD-10-CM | POA: Diagnosis not present

## 2019-12-01 DIAGNOSIS — E113593 Type 2 diabetes mellitus with proliferative diabetic retinopathy without macular edema, bilateral: Secondary | ICD-10-CM | POA: Diagnosis present

## 2019-12-01 DIAGNOSIS — N183 Chronic kidney disease, stage 3 unspecified: Secondary | ICD-10-CM | POA: Diagnosis present

## 2019-12-01 DIAGNOSIS — R52 Pain, unspecified: Secondary | ICD-10-CM | POA: Diagnosis not present

## 2019-12-01 DIAGNOSIS — K7689 Other specified diseases of liver: Secondary | ICD-10-CM | POA: Diagnosis not present

## 2019-12-01 DIAGNOSIS — D689 Coagulation defect, unspecified: Secondary | ICD-10-CM | POA: Diagnosis present

## 2019-12-01 DIAGNOSIS — M2550 Pain in unspecified joint: Secondary | ICD-10-CM | POA: Diagnosis not present

## 2019-12-01 DIAGNOSIS — M21372 Foot drop, left foot: Secondary | ICD-10-CM | POA: Diagnosis present

## 2019-12-01 DIAGNOSIS — I252 Old myocardial infarction: Secondary | ICD-10-CM

## 2019-12-01 DIAGNOSIS — I951 Orthostatic hypotension: Secondary | ICD-10-CM | POA: Diagnosis not present

## 2019-12-01 DIAGNOSIS — E871 Hypo-osmolality and hyponatremia: Secondary | ICD-10-CM | POA: Diagnosis present

## 2019-12-01 DIAGNOSIS — Z8249 Family history of ischemic heart disease and other diseases of the circulatory system: Secondary | ICD-10-CM

## 2019-12-01 DIAGNOSIS — R778 Other specified abnormalities of plasma proteins: Secondary | ICD-10-CM | POA: Diagnosis not present

## 2019-12-01 DIAGNOSIS — K529 Noninfective gastroenteritis and colitis, unspecified: Secondary | ICD-10-CM | POA: Diagnosis present

## 2019-12-01 DIAGNOSIS — N179 Acute kidney failure, unspecified: Principal | ICD-10-CM | POA: Diagnosis present

## 2019-12-01 DIAGNOSIS — Z79891 Long term (current) use of opiate analgesic: Secondary | ICD-10-CM

## 2019-12-01 DIAGNOSIS — Z90710 Acquired absence of both cervix and uterus: Secondary | ICD-10-CM

## 2019-12-01 DIAGNOSIS — Z8673 Personal history of transient ischemic attack (TIA), and cerebral infarction without residual deficits: Secondary | ICD-10-CM

## 2019-12-01 DIAGNOSIS — I350 Nonrheumatic aortic (valve) stenosis: Secondary | ICD-10-CM | POA: Diagnosis present

## 2019-12-01 DIAGNOSIS — I129 Hypertensive chronic kidney disease with stage 1 through stage 4 chronic kidney disease, or unspecified chronic kidney disease: Secondary | ICD-10-CM | POA: Diagnosis present

## 2019-12-01 DIAGNOSIS — Z7401 Bed confinement status: Secondary | ICD-10-CM | POA: Diagnosis not present

## 2019-12-01 DIAGNOSIS — Z7901 Long term (current) use of anticoagulants: Secondary | ICD-10-CM

## 2019-12-01 DIAGNOSIS — E878 Other disorders of electrolyte and fluid balance, not elsewhere classified: Secondary | ICD-10-CM | POA: Diagnosis present

## 2019-12-01 DIAGNOSIS — E876 Hypokalemia: Secondary | ICD-10-CM | POA: Diagnosis present

## 2019-12-01 DIAGNOSIS — Z794 Long term (current) use of insulin: Secondary | ICD-10-CM | POA: Diagnosis not present

## 2019-12-01 DIAGNOSIS — E039 Hypothyroidism, unspecified: Secondary | ICD-10-CM | POA: Diagnosis present

## 2019-12-01 DIAGNOSIS — I700 Atherosclerosis of aorta: Secondary | ICD-10-CM | POA: Diagnosis not present

## 2019-12-01 DIAGNOSIS — K209 Esophagitis, unspecified without bleeding: Secondary | ICD-10-CM | POA: Diagnosis present

## 2019-12-01 DIAGNOSIS — Z86718 Personal history of other venous thrombosis and embolism: Secondary | ICD-10-CM | POA: Diagnosis not present

## 2019-12-01 DIAGNOSIS — R188 Other ascites: Secondary | ICD-10-CM | POA: Diagnosis present

## 2019-12-01 DIAGNOSIS — R55 Syncope and collapse: Secondary | ICD-10-CM | POA: Diagnosis not present

## 2019-12-01 DIAGNOSIS — Z85828 Personal history of other malignant neoplasm of skin: Secondary | ICD-10-CM

## 2019-12-01 DIAGNOSIS — Z885 Allergy status to narcotic agent status: Secondary | ICD-10-CM

## 2019-12-01 DIAGNOSIS — R7989 Other specified abnormal findings of blood chemistry: Secondary | ICD-10-CM | POA: Diagnosis present

## 2019-12-01 DIAGNOSIS — K5730 Diverticulosis of large intestine without perforation or abscess without bleeding: Secondary | ICD-10-CM | POA: Diagnosis not present

## 2019-12-01 DIAGNOSIS — K3189 Other diseases of stomach and duodenum: Secondary | ICD-10-CM | POA: Diagnosis present

## 2019-12-01 DIAGNOSIS — Z96653 Presence of artificial knee joint, bilateral: Secondary | ICD-10-CM | POA: Diagnosis present

## 2019-12-01 DIAGNOSIS — Z955 Presence of coronary angioplasty implant and graft: Secondary | ICD-10-CM

## 2019-12-01 DIAGNOSIS — L8990 Pressure ulcer of unspecified site, unspecified stage: Secondary | ICD-10-CM | POA: Insufficient documentation

## 2019-12-01 DIAGNOSIS — K766 Portal hypertension: Secondary | ICD-10-CM | POA: Diagnosis present

## 2019-12-01 DIAGNOSIS — K7460 Unspecified cirrhosis of liver: Secondary | ICD-10-CM

## 2019-12-01 DIAGNOSIS — R1084 Generalized abdominal pain: Secondary | ICD-10-CM | POA: Diagnosis not present

## 2019-12-01 DIAGNOSIS — E860 Dehydration: Secondary | ICD-10-CM | POA: Diagnosis present

## 2019-12-01 DIAGNOSIS — E1122 Type 2 diabetes mellitus with diabetic chronic kidney disease: Secondary | ICD-10-CM | POA: Diagnosis present

## 2019-12-01 DIAGNOSIS — I2510 Atherosclerotic heart disease of native coronary artery without angina pectoris: Secondary | ICD-10-CM | POA: Diagnosis present

## 2019-12-01 DIAGNOSIS — E669 Obesity, unspecified: Secondary | ICD-10-CM | POA: Diagnosis present

## 2019-12-01 DIAGNOSIS — Z880 Allergy status to penicillin: Secondary | ICD-10-CM

## 2019-12-01 DIAGNOSIS — K449 Diaphragmatic hernia without obstruction or gangrene: Secondary | ICD-10-CM | POA: Diagnosis present

## 2019-12-01 DIAGNOSIS — Z6835 Body mass index (BMI) 35.0-35.9, adult: Secondary | ICD-10-CM

## 2019-12-01 DIAGNOSIS — R112 Nausea with vomiting, unspecified: Secondary | ICD-10-CM | POA: Diagnosis not present

## 2019-12-01 LAB — COMPREHENSIVE METABOLIC PANEL
ALT: 28 U/L (ref 0–44)
AST: 48 U/L — ABNORMAL HIGH (ref 15–41)
Albumin: 3.5 g/dL (ref 3.5–5.0)
Alkaline Phosphatase: 379 U/L — ABNORMAL HIGH (ref 38–126)
Anion gap: 20 — ABNORMAL HIGH (ref 5–15)
BUN: 72 mg/dL — ABNORMAL HIGH (ref 8–23)
CO2: 21 mmol/L — ABNORMAL LOW (ref 22–32)
Calcium: 9.9 mg/dL (ref 8.9–10.3)
Chloride: 92 mmol/L — ABNORMAL LOW (ref 98–111)
Creatinine, Ser: 4.11 mg/dL — ABNORMAL HIGH (ref 0.44–1.00)
GFR calc Af Amer: 11 mL/min — ABNORMAL LOW (ref >60)
GFR calc non Af Amer: 9 mL/min — ABNORMAL LOW (ref >60)
Glucose, Bld: 200 mg/dL — ABNORMAL HIGH (ref 70–99)
Potassium: 3.3 mmol/L — ABNORMAL LOW (ref 3.5–5.1)
Sodium: 133 mmol/L — ABNORMAL LOW (ref 135–145)
Total Bilirubin: 2.8 mg/dL — ABNORMAL HIGH (ref 0.3–1.2)
Total Protein: 7.8 g/dL (ref 6.5–8.1)

## 2019-12-01 LAB — CBC WITH DIFFERENTIAL/PLATELET
Abs Immature Granulocytes: 0.04 10*3/uL (ref 0.00–0.07)
Basophils Absolute: 0 10*3/uL (ref 0.0–0.1)
Basophils Relative: 0 %
Eosinophils Absolute: 0 10*3/uL (ref 0.0–0.5)
Eosinophils Relative: 1 %
HCT: 46.4 % — ABNORMAL HIGH (ref 36.0–46.0)
Hemoglobin: 15.8 g/dL — ABNORMAL HIGH (ref 12.0–15.0)
Immature Granulocytes: 1 %
Lymphocytes Relative: 21 %
Lymphs Abs: 1.2 10*3/uL (ref 0.7–4.0)
MCH: 30.6 pg (ref 26.0–34.0)
MCHC: 34.1 g/dL (ref 30.0–36.0)
MCV: 89.7 fL (ref 80.0–100.0)
Monocytes Absolute: 0.5 10*3/uL (ref 0.1–1.0)
Monocytes Relative: 8 %
Neutro Abs: 4.1 10*3/uL (ref 1.7–7.7)
Neutrophils Relative %: 69 %
Platelets: 145 10*3/uL — ABNORMAL LOW (ref 150–400)
RBC: 5.17 MIL/uL — ABNORMAL HIGH (ref 3.87–5.11)
RDW: 13.6 % (ref 11.5–15.5)
WBC: 5.9 10*3/uL (ref 4.0–10.5)
nRBC: 0 % (ref 0.0–0.2)

## 2019-12-01 LAB — TROPONIN I (HIGH SENSITIVITY)
Troponin I (High Sensitivity): 88 ng/L — ABNORMAL HIGH (ref <18)
Troponin I (High Sensitivity): 83 ng/L — ABNORMAL HIGH (ref <18)

## 2019-12-01 LAB — LIPASE, BLOOD: Lipase: 44 U/L (ref 11–51)

## 2019-12-01 LAB — CBG MONITORING, ED: Glucose-Capillary: 166 mg/dL — ABNORMAL HIGH (ref 70–99)

## 2019-12-01 LAB — PROTIME-INR
INR: 1.8 — ABNORMAL HIGH (ref 0.8–1.2)
Prothrombin Time: 20.4 seconds — ABNORMAL HIGH (ref 11.4–15.2)

## 2019-12-01 LAB — LACTIC ACID, PLASMA: Lactic Acid, Venous: 2.1 mmol/L (ref 0.5–1.9)

## 2019-12-01 LAB — SARS CORONAVIRUS 2 BY RT PCR (HOSPITAL ORDER, PERFORMED IN ~~LOC~~ HOSPITAL LAB): SARS Coronavirus 2: NEGATIVE

## 2019-12-01 MED ORDER — SODIUM CHLORIDE 0.9% FLUSH
3.0000 mL | INTRAVENOUS | Status: DC | PRN
  Administered 2019-12-02: 3 mL via INTRAVENOUS

## 2019-12-01 MED ORDER — SODIUM CHLORIDE 0.9% FLUSH
3.0000 mL | Freq: Two times a day (BID) | INTRAVENOUS | Status: DC
  Administered 2019-12-01 – 2019-12-03 (×2): 3 mL via INTRAVENOUS

## 2019-12-01 MED ORDER — SODIUM CHLORIDE 0.9 % IV BOLUS
500.0000 mL | Freq: Once | INTRAVENOUS | Status: AC
  Administered 2019-12-01: 500 mL via INTRAVENOUS

## 2019-12-01 MED ORDER — ONDANSETRON HCL 4 MG PO TABS: 4.0000 mg | ORAL_TABLET | Freq: Four times a day (QID) | ORAL | Status: DC | PRN

## 2019-12-01 MED ORDER — INSULIN ASPART 100 UNIT/ML ~~LOC~~ SOLN
0.0000 [IU] | SUBCUTANEOUS | Status: DC
  Administered 2019-12-01: 1 [IU] via SUBCUTANEOUS

## 2019-12-01 MED ORDER — SODIUM CHLORIDE 0.9 % IV SOLN: 250.0000 mL | INTRAVENOUS | Status: DC | PRN

## 2019-12-01 MED ORDER — SODIUM CHLORIDE 0.9 % IV BOLUS
1000.0000 mL | Freq: Once | INTRAVENOUS | Status: AC
  Administered 2019-12-01: 1000 mL via INTRAVENOUS

## 2019-12-01 MED ORDER — SODIUM CHLORIDE 0.9 % IV BOLUS: 500.0000 mL | Freq: Once | INTRAVENOUS | Status: DC

## 2019-12-01 MED ORDER — ACETAMINOPHEN 325 MG PO TABS: 650.0000 mg | ORAL_TABLET | Freq: Four times a day (QID) | ORAL | Status: DC | PRN

## 2019-12-01 MED ORDER — FAMOTIDINE IN NACL 20-0.9 MG/50ML-% IV SOLN
20.0000 mg | Freq: Every day | INTRAVENOUS | Status: DC
  Administered 2019-12-01 – 2019-12-03 (×3): 20 mg via INTRAVENOUS
  Filled 2019-12-01 (×4): qty 50

## 2019-12-01 MED ORDER — ONDANSETRON HCL 4 MG/2ML IJ SOLN
4.0000 mg | Freq: Once | INTRAMUSCULAR | Status: AC
  Administered 2019-12-01: 4 mg via INTRAVENOUS
  Filled 2019-12-01: qty 2

## 2019-12-01 MED ORDER — ONDANSETRON HCL 4 MG/2ML IJ SOLN
4.0000 mg | Freq: Four times a day (QID) | INTRAMUSCULAR | Status: DC | PRN
  Administered 2019-12-03: 4 mg via INTRAVENOUS
  Filled 2019-12-01: qty 2

## 2019-12-01 MED ORDER — SODIUM CHLORIDE 0.9% FLUSH
3.0000 mL | Freq: Two times a day (BID) | INTRAVENOUS | Status: DC
  Administered 2019-12-01: 3 mL via INTRAVENOUS

## 2019-12-01 MED ORDER — APIXABAN 5 MG PO TABS
5.0000 mg | ORAL_TABLET | Freq: Two times a day (BID) | ORAL | Status: DC
  Administered 2019-12-01 – 2019-12-06 (×10): 5 mg via ORAL
  Filled 2019-12-01 (×10): qty 1

## 2019-12-01 MED ORDER — METOCLOPRAMIDE HCL 5 MG/ML IJ SOLN
5.0000 mg | Freq: Once | INTRAMUSCULAR | Status: AC
  Administered 2019-12-01: 5 mg via INTRAVENOUS
  Filled 2019-12-01: qty 2

## 2019-12-01 MED ORDER — POTASSIUM CHLORIDE IN NACL 20-0.9 MEQ/L-% IV SOLN
INTRAVENOUS | Status: AC
  Filled 2019-12-01 (×2): qty 1000

## 2019-12-01 MED ORDER — ACETAMINOPHEN 650 MG RE SUPP: 650.0000 mg | Freq: Four times a day (QID) | RECTAL | Status: DC | PRN

## 2019-12-01 NOTE — ED Notes (Signed)
Pt to ct 

## 2019-12-01 NOTE — ED Notes (Signed)
We found a number for the pts daughter  Left a message for her to call us about her mother she is  ;isa cox   Number [phone]

## 2019-12-01 NOTE — ED Notes (Signed)
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

## 2019-12-01 NOTE — H&P (Signed)
History and Physical    Tonya Hunter XNT:[id number] DOB: 07-Mar-1935 DOA: 12/01/2019  PCP: [name], MD   Patient coming from: Home   Chief Complaint: N/V/D, weakness   HPI: Tonya Hunter is a 85 y.o. female with medical history significant for hypertension, coronary artery disease, insulin-dependent diabetes mellitus, history of DVT on Eliquis, history of [name] with cirrhosis with ascites, and chronic kidney disease stage III, now presenting to the emergency department with nausea, vomiting, diarrhea, and generalized weakness.  Patient had been living at a SNF until returning home with her husband 3 or 4 days ago.  Since then, she has had nausea, nonbloody vomiting, and loose stools.  There is report that her SNF roommate has had similar symptoms.  Patient denies any abdominal pain, denies chest pain, and denies shortness of breath or cough.  She denies fevers or chills.  EMS was called out today and reports that systolic blood pressure dropped to 70 when the patient stood, recovering quickly when she was lowered.  She was vomiting and treated with Zofran and 200 cc of saline prior to arrival in the ED.  Patient denies any loss of consciousness, palpitations, or chest discomfort.  ED Course: Upon arrival to the ED, patient is found to be afebrile, saturating well on room air, and with stable blood pressure.  EKG features sinus rhythm with chronic RBBB.  CT the abdomen and pelvis is notable for small hiatal hernia with wall thickening and question of mild esophagitis, cirrhosis, and no hydronephrosis.  Chemistry panel features a chronic hyperbilirubinemia, mild hypokalemia, BUN 72, and creatinine 4.11, up from 1.2 last month.  CBC with chronic mild thrombocytopenia and hemoglobin 15.8.  Troponin is mildly elevated and decreasing.  COVID-19 screening test not yet resulted.  Blood cultures were collected in the ED and the patient was given 1.5 L of saline and multiple doses of Zofran.  Review of  Systems:  All other systems reviewed and apart from HPI, are negative.  Past Medical History:  Diagnosis Date  . Aortic stenosis   . Basal cell carcinoma of face    "several burned off my face" (06/14/2016)  . CAD (coronary artery disease)    multiple stents  . Carpal tunnel syndrome   . CHF (congestive heart failure) ([place])   . Colles' fracture of left radius   . Coronary artery disease 09/2005   s/p TAXUS DRUG-ELUTING STENT PLACEMENT TO THE LEFT ANTERIOR DESCENDING ARTERY  . Diabetic foot ([place])   . Duodenal diverticulum   . Duodenitis 05/2016  . Esophageal varices (HCC)    s/p esophageal banding 06/16/16, 07/07/16  . Gastric varices 06/2016   s/p banding  . Heart attack ([place]) 2012  . Hematemesis 06/14/2016  . Hyperlipidemia   . Hypertension   . Hypothyroidism   . Left foot drop   . Macular degeneration   . Myocardial infarction [facility]) 2011   "after my knee replacement"  . Osteoarthrosis, unspecified whether generalized or localized, unspecified site   . Portal hypertensive gastropathy ([place])   . Post concussive syndrome   . Proliferative diabetic retinopathy of both eyes (HCC)    severe with macula edema  . Stroke ([place]) 10/2018  . TIA (transient ischemic attack) 02/2017  . Type II diabetes mellitus ([place])   . UTI (urinary tract infection) 11/05/2018    Past Surgical History:  Procedure Laterality Date  . ABDOMINAL HYSTERECTOMY    . APPENDECTOMY    . CATARACT EXTRACTION Bilateral   . CORONARY  ANGIOPLASTY WITH STENT PLACEMENT  09/2005   PLACEMENT TO LEFT ANTERIOR DESCENDING ARTERY  . ESOPHAGEAL BANDING N/A 06/16/2016   Procedure: ESOPHAGEAL BANDING;  Surgeon: Teena Irani, MD;  Location: [place];  Service: Endoscopy;  Laterality: N/A;  . ESOPHAGOGASTRODUODENOSCOPY N/A 06/17/2014   Procedure: ESOPHAGOGASTRODUODENOSCOPY (EGD);  Surgeon: Missy Sabins, MD;  Location: [facility] ENDOSCOPY;  Service: Endoscopy;  Laterality: N/A;  . ESOPHAGOGASTRODUODENOSCOPY N/A 06/14/2016   Procedure:  ESOPHAGOGASTRODUODENOSCOPY (EGD);  Surgeon: Teena Irani, MD;  Location: [facility] ENDOSCOPY;  Service: Endoscopy;  Laterality: N/A;  . ESOPHAGOGASTRODUODENOSCOPY (EGD) WITH PROPOFOL N/A 06/16/2016   Procedure: ESOPHAGOGASTRODUODENOSCOPY (EGD) WITH PROPOFOL;  Surgeon: Teena Irani, MD;  Location: [place];  Service: Endoscopy;  Laterality: N/A;  . ESOPHAGOGASTRODUODENOSCOPY (EGD) WITH PROPOFOL N/A 07/07/2016   Procedure: ESOPHAGOGASTRODUODENOSCOPY (EGD) WITH PROPOFOL;  Surgeon: Teena Irani, MD;  Location: [place];  Service: Endoscopy;  Laterality: N/A;  . FRACTURE SURGERY    . GASTRIC VARICES BANDING N/A 07/07/2016   Procedure: GASTRIC VARICES BANDING;  Surgeon: Teena Irani, MD;  Location: [place];  Service: Endoscopy;  Laterality: N/A;  . HERNIA REPAIR    . HUMERUS FRACTURE SURGERY Left 2001   "put metal disc in months after I broke my shoulder"  . IR IVC FILTER PLMT / S&I /IMG GUID/MOD SED  09/11/2019  . IR PARACENTESIS  09/23/2019  . IR PARACENTESIS  09/27/2019  . IR PARACENTESIS  10/08/2019  . IR PARACENTESIS  10/16/2019  . JOINT REPLACEMENT    . LAPAROSCOPIC CHOLECYSTECTOMY  10/02/2001  . LAPAROSCOPIC CHOLECYSTECTOMY    . LAPAROSCOPIC INCISIONAL / UMBILICAL / VENTRAL HERNIA REPAIR  03/26/2002   s/p repair for incarcerated ventral hernia  . LEFT HEART CATH AND CORONARY ANGIOGRAPHY N/A 09/15/2016   Procedure: Left Heart Cath and Coronary Angiography;  Surgeon: Peter M Martinique, MD;  [facility];  Service: Cardiovascular;  Laterality: N/A;  . LOOP RECORDER INSERTION N/A 10/29/2018   Procedure: LOOP RECORDER INSERTION;  Surgeon: Evans Lance, MD;  Location: [place] [facility];  Service: Cardiovascular;  Laterality: N/A;  . LOOP RECORDER INSERTION  10/2018  . MEDIAN NERVE REPAIR Bilateral 2009   DECOMPRESSION...RIGHT AND LEFT DECOMPRESSION  . ORIF FEMUR FRACTURE Right 09/12/2019   Procedure: OPEN REDUCTION INTERNAL FIXATION (ORIF) DISTAL FEMUR FRACTURE;  Surgeon: Shona Needles,  MD;  Location: [place];  Service: Orthopedics;  Laterality: Right;  . PERONEAL NERVE DECOMPRESSION Left 05/13/2019   Procedure: LEFT PERONEAL NERVE DECOMPRESSION;  Surgeon: Eustace Moore, MD;  Location: [place];  Service: Neurosurgery;  Laterality: Left;  . PERONEAL NERVE DECOMPRESSION Left 2020  . TOTAL ABDOMINAL HYSTERECTOMY    . TOTAL KNEE ARTHROPLASTY Bilateral 2008-2011   "right-left"    Social History:   reports that she has never smoked. She has never used smokeless tobacco. She reports previous alcohol use. She reports that she does not use drugs.  Allergies  Allergen Reactions  . Penicillins Swelling and Other (See Comments)    Caused weakness and swelling of the feet Has patient had a PCN reaction causing immediate rash, facial/tongue/throat swelling, SOB or lightheadedness with hypotension: Yes Has patient had a PCN reaction causing severe rash involving mucus membranes or skin necrosis: Unk Has patient had a PCN reaction that required hospitalization: Yes Has patient had a PCN reaction occurring within the last 10 years: No If all of the above answers are "NO", then may proceed with Cephalosporin use.  . Demerol [Meperidine] Nausea And Vomiting  . Penicillins Swelling  approx 85 years old  . Demerol [Meperidine] Nausea And Vomiting    Family History  Problem Relation Age of Onset  . Cancer Mother   . Heart attack Father      Prior to Admission medications   Medication Sig Start Date End Date Taking? Authorizing Provider  ACCU-CHEK SMARTVIEW test strip 1 each by Other route 3 (three) times daily.  06/06/18   [provider]  alum & mag hydroxide-simeth (MAALOX/MYLANTA) 200-200-20 MG/5ML suspension Take 30 mLs by mouth every 4 (four) hours as needed for indigestion. 10/29/19   Love, Ivan Anchors, PA-C  apixaban (ELIQUIS) 5 MG TABS tablet Take 1 tablet (5 mg total) by mouth 2 (two) times daily. 09/24/19   Hosie Poisson, MD  ascorbic acid (VITAMIN C) 500 MG tablet Take  1 tablet (500 mg total) by mouth 2 (two) times daily. 10/29/19   Love, Ivan Anchors, PA-C  collagenase (SANTYL) ointment Apply topically daily. To yellow eschar on sacrum and cover with damp to dry dressing daily 10/29/19   Love, Ivan Anchors, PA-C  ferrous sulfate 325 (65 FE) MG tablet Take 1 tablet (325 mg total) by mouth daily with breakfast. 09/25/19   Hosie Poisson, MD  insulin glargine (LANTUS) 100 UNIT/ML injection Inject 0.12 mLs (12 Units total) into the skin daily. 10/29/19   Love, Ivan Anchors, PA-C  lactulose (CHRONULAC) 10 GM/15ML solution Take 60 mLs (40 g total) by mouth 2 (two) times daily. 10/29/19   Love, Ivan Anchors, PA-C  levothyroxine (SYNTHROID, LEVOTHROID) 137 MCG tablet Take 137 mcg by mouth daily before breakfast.     [provider]  methocarbamol (ROBAXIN) 500 MG tablet Take 1 tablet (500 mg total) by mouth 3 (three) times daily. 09/24/19   Hosie Poisson, MD  Mouthwashes (MOUTH RINSE) LIQD solution 15 mLs by Mouth Rinse route 2 (two) times daily. 10/29/19   Love, Ivan Anchors, PA-C  Multiple Vitamins-Minerals (PRESERVISION AREDS 2) CAPS Take 1 capsule by mouth 2 (two) times daily.     [provider]  nitroGLYCERIN (NITROSTAT) 0.4 MG SL tablet Place 1 tablet (0.4 mg total) under the tongue every 5 (five) minutes as needed for chest pain. Max 3 doses. Patient taking differently: Place 0.4 mg under the tongue every 5 (five) minutes x 3 doses as needed for chest pain.  09/13/18   Josue Hector, MD  oxyCODONE (OXY IR/ROXICODONE) 5 MG immediate release tablet Take 1 tablet (5 mg total) by mouth every 8 (eight) hours as needed for severe pain. 10/29/19   Love, Ivan Anchors, PA-C  oxyCODONE (OXYCONTIN) 10 mg 12 hr tablet Take 1 tablet (10 mg total) by mouth every 12 (twelve) hours. 10/29/19   Love, Ivan Anchors, PA-C  pantoprazole (PROTONIX) 40 MG tablet Take 40 mg by mouth 2 (two) times daily.    [provider]  polyethylene glycol (MIRALAX / GLYCOLAX) 17 g packet Take 17 g by mouth  daily. 10/29/19   Love, Ivan Anchors, PA-C  protein supplement (RESOURCE BENEPROTEIN) POWD Take 6 g by mouth 3 (three) times daily with meals. 10/29/19   Love, Ivan Anchors, PA-C  rifaximin (XIFAXAN) 550 MG TABS tablet Take 1 tablet (550 mg total) by mouth 2 (two) times daily. 10/29/19   Love, Ivan Anchors, PA-C  spironolactone (ALDACTONE) 50 MG tablet Take 1 tablet (50 mg total) by mouth 2 (two) times daily. 10/29/19   Love, Ivan Anchors, PA-C  torsemide (DEMADEX) 20 MG tablet Take 1 tablet (20 mg total) by mouth daily. 10/29/19  Love, Pamela S, PA-C  traMADol (ULTRAM) 50 MG tablet Take 1 tablet (50 mg total) by mouth every 6 (six) hours as needed for moderate pain. 10/29/19   Love, Ivan Anchors, PA-C  zinc sulfate 220 (50 Zn) MG capsule Take 1 capsule (220 mg total) by mouth daily. 10/29/19   Bary Leriche, PA-C    Physical Exam: Vitals:   12/01/19 1730 12/01/19 1745 12/01/19 1951 12/01/19 2003  BP: 124/66 124/66    Pulse: 87 86 83 84  Resp: 10 (!) 9 16 15   Temp:      TempSrc:      SpO2: 98% 98% 97% 98%  Weight:      Height:        Constitutional: NAD, calm  Eyes: PERTLA, lids and conjunctivae normal ENMT: Mucous membranes are moist. Posterior pharynx clear of any exudate or lesions.   Neck: normal, supple, no masses, no thyromegaly Respiratory: no wheezing, no crackles. No accessory muscle use.  Cardiovascular: S1 & S2 heard, regular rate and rhythm. No extremity edema.  Abdomen: No distension, no tenderness, soft. Bowel sounds active.  Musculoskeletal: no clubbing / cyanosis. No joint deformity upper and lower extremities.   Skin: Scattered ecchymoses, skin tears. Warm, dry, well-perfused. Neurologic: CN 2-12 grossly intact. Sensation intact. Moving all extremities.  Psychiatric: Alert and oriented to person, place, and situation. Pleasant and cooperative.    Labs and Imaging on Admission: I have personally reviewed following labs and imaging studies  CBC: Recent Labs  Lab 12/01/19 1720  WBC 5.9    NEUTROABS 4.1  HGB 15.8*  HCT 46.4*  MCV 89.7  PLT 696*   Basic Metabolic Panel: Recent Labs  Lab 12/01/19 1720  NA 133*  K 3.3*  CL 92*  CO2 21*  GLUCOSE 200*  BUN 72*  CREATININE 4.11*  CALCIUM 9.9   GFR: Estimated Creatinine Clearance: 12.7 mL/min (A) (by C-G formula based on SCr of 4.11 mg/dL (H)). Liver Function Tests: Recent Labs  Lab 12/01/19 1720  AST 48*  ALT 28  ALKPHOS 379*  BILITOT 2.8*  PROT 7.8  ALBUMIN 3.5   Recent Labs  Lab 12/01/19 1720  LIPASE 44   No results for input(s): AMMONIA in the last 168 hours. Coagulation Profile: No results for input(s): INR, PROTIME in the last 168 hours. Cardiac Enzymes: No results for input(s): CKTOTAL, CKMB, CKMBINDEX, TROPONINI in the last 168 hours. BNP (last 3 results) No results for input(s): PROBNP in the last 8760 hours. HbA1C: No results for input(s): HGBA1C in the last 72 hours. CBG: No results for input(s): GLUCAP in the last 168 hours. Lipid Profile: No results for input(s): CHOL, HDL, LDLCALC, TRIG, CHOLHDL, LDLDIRECT in the last 72 hours. Thyroid Function Tests: No results for input(s): TSH, T4TOTAL, FREET4, T3FREE, THYROIDAB in the last 72 hours. Anemia Panel: No results for input(s): VITAMINB12, FOLATE, FERRITIN, TIBC, IRON, RETICCTPCT in the last 72 hours. Urine analysis:    Component Value Date/Time   COLORURINE AMBER (A) 09/26/2019 1350   APPEARANCEUR CLEAR 09/26/2019 1350   LABSPEC 1.035 (H) 09/26/2019 1350   PHURINE 5.0 09/26/2019 1350   GLUCOSEU NEGATIVE 09/26/2019 1350   HGBUR NEGATIVE 09/26/2019 1350   BILIRUBINUR SMALL (A) 09/26/2019 1350   KETONESUR NEGATIVE 09/26/2019 1350   PROTEINUR NEGATIVE 09/26/2019 1350   UROBILINOGEN 1.0 06/16/2014 2138   NITRITE NEGATIVE 09/26/2019 1350   LEUKOCYTESUR NEGATIVE 09/26/2019 1350   Sepsis Labs: @LABRCNTIP (procalcitonin:4,lacticidven:4) )No results found for this or any previous visit (from the past  240 hour(s)).   Radiological  Exams on Admission: CT ABDOMEN PELVIS WO CONTRAST  Result Date: 12/01/2019 CLINICAL DATA:  Abdominal pain with nausea EXAM: CT ABDOMEN AND PELVIS WITHOUT CONTRAST TECHNIQUE: Multidetector CT imaging of the abdomen and pelvis was performed following the standard protocol without IV contrast. COMPARISON:  None. FINDINGS: Lower chest: The visualized heart size within normal limits. No pericardial fluid/thickening. A small hiatal hernia is present. There is question of mild wall thickening seen at the distal GE junction. The visualized portions of the lungs are clear. Hepatobiliary: There is a nodular liver contour. The patient is status post cholecystectomy. Although limited due to lack of intravenous contrast. No focal hepatic lesion. Pancreas:  Unremarkable.  No surrounding inflammatory changes. Spleen: Normal in size. Although limited due to the lack of intravenous contrast, normal in appearance. Adrenals/Urinary Tract: Both adrenal glands appear normal. The kidneys and collecting system appear normal without evidence of urinary tract calculus or hydronephrosis. Bladder is unremarkable. Stomach/Bowel: The stomach and small bowel are normal in appearance. There is scattered colonic diverticula without diverticulitis. Vascular/Lymphatic: There are no enlarged abdominal or pelvic lymph nodes. Scattered aortic atherosclerotic calcifications are seen without aneurysmal dilatation. Again noted is infrarenal IVC filter at the L2 level. Reproductive: The patient is status post hysterectomy. No adnexal masses or collections seen. Other: Right lower abdominal mesh is seen. There is mild diffuse anasarca. A small amount of perihepatic ascites is noted. There is diffuse osteopenia. Musculoskeletal: No acute or significant osseous findings. IMPRESSION: Small hiatal hernia with question of mild wall thickening which could be due to mild esophagitis. Findings suggestive of cirrhosis. Small amount of perihepatic ascites. Aortic  Atherosclerosis (ICD10-I70.0). Diverticulosis without diverticulitis. Electronically Signed   By: Prudencio Pair M.D.   On: 12/01/2019 19:11    EKG: Independently reviewed. Sinus rhythm, RBBB.   Assessment/Plan   1. Acute kidney injury superimposed on CKD IIIb - SCr is 4.11 on admission, up from apparent baseline of 1-1.2  - There is no hydronephrosis on CT and this is likely prerenal azotemia in setting of recent N/V/D  - Check UA and urine chemistries, continue IVF hydration, renally-dose medications, monitor    2. Acute gastroenteritis  - Presents with 3-4 days of N/V/D without abdominal pain or bleeding  - Her roommate has had same sxs and this is likely acute viral gastroenteritis  - Check GI pathogen panel, continue IVF hydration, monitor electrolytes, institute enteric precautions    3. Cirrhosis  - Appears compensated  - Hold diuretics while hydrating, monitor electrolytes    4. CAD; elevated troponin  - No anginal complaints  - Troponin mildly elevated and decreasing, EKG similar to priors   5. Insulin-dependent DM  - A1c was 9.2% in April 2021  - Start with low-intensity SSI only in light of renal failure    6. Hypertension  - BP at goal, reportedly dropped SBP to 70s on standing with EMS  - Hold antihypertensives initially    7. History of DVT  - Continue Eliquis    8. Near-syncope  - Per EMS report, SBP dropped to 70 on standing and recovered quickly; pt reports lightheadedness on standing without LOC  - EKG with SR, chronic RBBB  - Continue IVF hydration, up with assistance only for now    DVT prophylaxis: Eliquis  Code Status: DNR, confirmed with patient in ED  Family Communication: Discussed with patient  Disposition Plan:  Patient is from: Home  Anticipated d/c is to: TBD Anticipated d/c date  is: 12/04/19 Patient currently: In acute renal failure Consults called: None  Admission status: Inpatient     Vianne Bulls, MD [facility]  12/01/2019, 8:08 PM
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
## 2019-12-01 NOTE — ED Notes (Signed)
The pt has stopped dry heaving iv fluid infused

## 2019-12-01 NOTE — ED Notes (Signed)
Our lab having difficulty getting blood cultures
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
## 2019-12-02 ENCOUNTER — Ambulatory Visit (HOSPITAL_COMMUNITY): Payer: Medicare Other | Admitting: *Deleted

## 2019-12-02 DIAGNOSIS — E876 Hypokalemia: Secondary | ICD-10-CM

## 2019-12-02 DIAGNOSIS — N179 Acute kidney failure, unspecified: Secondary | ICD-10-CM

## 2019-12-02 DIAGNOSIS — D696 Thrombocytopenia, unspecified: Secondary | ICD-10-CM

## 2019-12-02 DIAGNOSIS — I639 Cerebral infarction, unspecified: Secondary | ICD-10-CM | POA: Diagnosis not present

## 2019-12-02 LAB — URINALYSIS, ROUTINE W REFLEX MICROSCOPIC
Bilirubin Urine: NEGATIVE
Glucose, UA: NEGATIVE mg/dL
Hgb urine dipstick: NEGATIVE
Ketones, ur: NEGATIVE mg/dL
Nitrite: NEGATIVE
Protein, ur: NEGATIVE mg/dL
Specific Gravity, Urine: 1.02 (ref 1.005–1.030)
pH: 5 (ref 5.0–8.0)

## 2019-12-02 LAB — COMPREHENSIVE METABOLIC PANEL
ALT: 25 U/L (ref 0–44)
AST: 37 U/L (ref 15–41)
Albumin: 2.9 g/dL — ABNORMAL LOW (ref 3.5–5.0)
Alkaline Phosphatase: 295 U/L — ABNORMAL HIGH (ref 38–126)
Anion gap: 14 (ref 5–15)
BUN: 68 mg/dL — ABNORMAL HIGH (ref 8–23)
CO2: 18 mmol/L — ABNORMAL LOW (ref 22–32)
Calcium: 9 mg/dL (ref 8.9–10.3)
Chloride: 104 mmol/L (ref 98–111)
Creatinine, Ser: 3.75 mg/dL — ABNORMAL HIGH (ref 0.44–1.00)
GFR calc Af Amer: 12 mL/min — ABNORMAL LOW (ref >60)
GFR calc non Af Amer: 11 mL/min — ABNORMAL LOW (ref >60)
Glucose, Bld: 148 mg/dL — ABNORMAL HIGH (ref 70–99)
Potassium: 3.7 mmol/L (ref 3.5–5.1)
Sodium: 136 mmol/L (ref 135–145)
Total Bilirubin: 2.4 mg/dL — ABNORMAL HIGH (ref 0.3–1.2)
Total Protein: 6.6 g/dL (ref 6.5–8.1)

## 2019-12-02 LAB — CBC
HCT: 42 % (ref 36.0–46.0)
Hemoglobin: 14 g/dL (ref 12.0–15.0)
MCH: 31.4 pg (ref 26.0–34.0)
MCHC: 33.3 g/dL (ref 30.0–36.0)
MCV: 94.2 fL (ref 80.0–100.0)
Platelets: 98 10*3/uL — ABNORMAL LOW (ref 150–400)
RBC: 4.46 MIL/uL (ref 3.87–5.11)
RDW: 14 % (ref 11.5–15.5)
WBC: 4.8 10*3/uL (ref 4.0–10.5)
nRBC: 0 % (ref 0.0–0.2)

## 2019-12-02 LAB — CUP PACEART REMOTE DEVICE CHECK
Date Time Interrogation Session: 20210718231704
Implantable Pulse Generator Implant Date: 20200615

## 2019-12-02 LAB — CBG MONITORING, ED
Glucose-Capillary: 147 mg/dL — ABNORMAL HIGH (ref 70–99)
Glucose-Capillary: 160 mg/dL — ABNORMAL HIGH (ref 70–99)
Glucose-Capillary: 174 mg/dL — ABNORMAL HIGH (ref 70–99)
Glucose-Capillary: 149 mg/dL — ABNORMAL HIGH (ref 70–99)
Glucose-Capillary: 141 mg/dL — ABNORMAL HIGH (ref 70–99)

## 2019-12-02 LAB — URINALYSIS, MICROSCOPIC (REFLEX)

## 2019-12-02 LAB — SODIUM, URINE, RANDOM: Sodium, Ur: 13 mmol/L

## 2019-12-02 LAB — MAGNESIUM: Magnesium: 1.5 mg/dL — ABNORMAL LOW (ref 1.7–2.4)

## 2019-12-02 LAB — HEMOGLOBIN A1C
Hgb A1c MFr Bld: 6.4 % — ABNORMAL HIGH (ref 4.8–5.6)
Mean Plasma Glucose: 136.98 mg/dL

## 2019-12-02 LAB — LACTIC ACID, PLASMA: Lactic Acid, Venous: 2 mmol/L (ref 0.5–1.9)

## 2019-12-02 LAB — CREATININE, URINE, RANDOM: Creatinine, Urine: 132.9 mg/dL

## 2019-12-02 LAB — AMMONIA: Ammonia: 57 umol/L — ABNORMAL HIGH (ref 9–35)

## 2019-12-02 MED ORDER — MAGNESIUM SULFATE IN D5W 1-5 GM/100ML-% IV SOLN
1.0000 g | INTRAVENOUS | Status: AC
  Administered 2019-12-03: 1 g via INTRAVENOUS
  Filled 2019-12-02 (×2): qty 100

## 2019-12-02 MED ORDER — MAGNESIUM SULFATE IN D5W 1-5 GM/100ML-% IV SOLN
1.0000 g | Freq: Once | INTRAVENOUS | Status: AC
  Administered 2019-12-02: 1 g via INTRAVENOUS
  Filled 2019-12-02: qty 100

## 2019-12-02 MED ORDER — POTASSIUM CHLORIDE IN NACL 20-0.9 MEQ/L-% IV SOLN
INTRAVENOUS | Status: DC
  Filled 2019-12-02 (×5): qty 1000

## 2019-12-02 MED ORDER — INSULIN ASPART 100 UNIT/ML ~~LOC~~ SOLN
0.0000 [IU] | Freq: Three times a day (TID) | SUBCUTANEOUS | Status: DC
  Administered 2019-12-02 – 2019-12-03 (×4): 1 [IU] via SUBCUTANEOUS
  Administered 2019-12-04: 2 [IU] via SUBCUTANEOUS
  Administered 2019-12-04 – 2019-12-05 (×5): 1 [IU] via SUBCUTANEOUS
  Administered 2019-12-06: 2 [IU] via SUBCUTANEOUS
  Administered 2019-12-06: 1 [IU] via SUBCUTANEOUS

## 2019-12-02 MED ORDER — LACTULOSE 10 GM/15ML PO SOLN
30.0000 g | Freq: Two times a day (BID) | ORAL | Status: DC
  Administered 2019-12-02 – 2019-12-04 (×4): 30 g via ORAL
  Filled 2019-12-02 (×5): qty 45

## 2019-12-02 MED ORDER — PANTOPRAZOLE SODIUM 40 MG IV SOLR
40.0000 mg | Freq: Two times a day (BID) | INTRAVENOUS | Status: DC
  Administered 2019-12-02 – 2019-12-06 (×10): 40 mg via INTRAVENOUS
  Filled 2019-12-02 (×9): qty 40

## 2019-12-02 MED ORDER — INSULIN ASPART 100 UNIT/ML ~~LOC~~ SOLN
0.0000 [IU] | Freq: Every day | SUBCUTANEOUS | Status: DC
  Administered 2019-12-04: 2 [IU] via SUBCUTANEOUS

## 2019-12-02 MED ORDER — RIFAXIMIN 550 MG PO TABS
550.0000 mg | ORAL_TABLET | Freq: Two times a day (BID) | ORAL | Status: DC
  Administered 2019-12-02 – 2019-12-06 (×9): 550 mg via ORAL
  Filled 2019-12-02 (×9): qty 1

## 2019-12-02 MED ORDER — MAGNESIUM SULFATE IN D5W 1-5 GM/100ML-% IV SOLN
1.0000 g | Freq: Once | INTRAVENOUS | Status: DC
  Filled 2019-12-02: qty 100

## 2019-12-02 NOTE — ED Notes (Signed)
In and out cath performed, pt has not voided since she has been here in 8 hours, 100cc in drained from bladder.

## 2019-12-02 NOTE — Progress Notes (Signed)
PROGRESS NOTE  Tonya Hunter GOT:[id number] DOB: 12/07/1934 DOA: 12/01/2019 PCP: [name], MD  Brief History:  85 y.o. female with medical history significant for hypertension, coronary artery disease, insulin-dependent diabetes mellitus, history of DVT on Eliquis, history of [name] with cirrhosis with ascites, and chronic kidney disease stage III, now presenting to the emergency department with nausea, vomiting, diarrhea, and generalized weakness.  Patient had been living at a SNF until returning home with her husband 3 or 4 days ago.  Since then, she has had nausea, nonbloody vomiting, and loose stools.  There is report that her SNF roommate has had similar symptoms.  Patient denies any abdominal pain, denies chest pain, and denies shortness of breath or cough.  She denies fevers or chills.  EMS was called out today and reports that systolic blood pressure dropped to 70 when the patient stood, recovering quickly when she was lowered.  She was vomiting and treated with Zofran and 200 cc of saline prior to arrival in the ED.    ED Course: Upon arrival to the ED, patient is found to be afebrile, saturating well on room air, and with stable blood pressure.  EKG features sinus rhythm with chronic RBBB.  CT the abdomen and pelvis is notable for small hiatal hernia with wall thickening and question of mild esophagitis, cirrhosis, and no hydronephrosis.  Chemistry panel features a chronic hyperbilirubinemia, mild hypokalemia, BUN 72, and creatinine 4.11, up from 1.2 last month.  CBC with chronic mild thrombocytopenia and hemoglobin 15.8.  Troponin is mildly elevated and decreasing.  COVID-19 screening test not yet resulted.  Blood cultures were collected in the ED and the patient was given 1.5 L of saline and multiple doses of Zofran.  Since arrival to the emergency department, the patient states that her vomiting and loose stools have seemed to improved.  She has not had any emesis or bowel  movement in the last 18-24 hours.   Assessment/Plan: 1. Acute kidney injury superimposed on CKD IIIb - SCr is 4.11 on admission - apparent baseline of 1.0-1.2  - There is no hydronephrosis on CT and this is likely prerenal azotemia in setting of recent N/V/D  - continue IVF  2. Acute gastroenteritis  - Presents with 3-4 days of N/V/D without abdominal pain or bleeding  - Her roommate has had same sxs and this is likely acute viral gastroenteritis  - Check GI pathogen panel - continue IVF - no recent abx to her knowledge -12/01/2019 CT abdomen mild thickening of the esophagus at the GE junction -Start PPI -clear liquids for now -UA neg for pyuria  3. Liver Cirrhosis with Ascites - Appears compensated  - due to NASH - Holding torsemide, spironolactone -Restart lactulose -Continue rifaximin   4. CAD; elevated troponin  - No anginal complaints  - Troponin mildly elevated and decreasing,  -Personally reviewed EKG unchanged RBBB  5. Insulin-dependent DM  - A1c was 9.2% in April 2021  - Start with low-intensity SSI only in light of renal failure   -Holding Lantus temporarily  6. Hypertension  - BP at goal, reportedly dropped SBP to 70s on standing with EMS  - Hold antihypertensives initially    7. History of DVT  - Continue Eliquis    8. Near-syncope  - Per EMS report, SBP dropped to 70 on standing and recovered quickly; pt reports lightheadedness on standing without LOC  - EKG with SR, chronic RBBB  - Continue  IVF hydration, up with assistance only for now  -PT eval  Hypomagnesemia -replete  Hyponatremia -due to volume depletion -continue IVF      Status is: Inpatient  Remains inpatient appropriate because:IV treatments appropriate due to intensity of illness or inability to take PO   Dispo: The patient is from: Home              Anticipated d/c is to: Home              Anticipated d/c date is: 2 days              Patient currently is not  medically stable to d/c.        Family Communication:  no Family at bedside  Consultants:  none  Code Status:  DNR  DVT Prophylaxis: apixaban   Procedures: As Listed in Progress Note Above  Antibiotics: None     Subjective: Patient denies fevers, chills, headache, chest pain, dyspnea, nausea, vomiting, diarrhea, abdominal pain, dysuria, hematuria, hematochezia, and melena.   Objective: Vitals:   12/02/19 0002 12/02/19 0400 12/02/19 0600 12/02/19 0800  BP:  101/64 112/62 109/68  Pulse: 80 79 79 83  Resp: 18 (!) 7 11 16   Temp:      TempSrc:      SpO2: 100% 95% 96% 97%  Weight:      Height:        Intake/Output Summary (Last 24 hours) at 12/02/2019 0909 Last data filed at 12/02/2019 0816 Gross per 24 hour  Intake 2100 ml  Output --  Net 2100 ml   Weight change:  Exam:   General:  Pt is alert, follows commands appropriately, not in acute distress  HEENT: No icterus, No thrush, No neck mass, [place]/AT  Cardiovascular: RRR, S1/S2, no rubs, no gallops  Respiratory: bibasilar crackles; no wheeze  Abdomen: Soft/+BS, non tender, non distended, no guarding  Extremities: No edema, No lymphangitis, No petechiae, No rashes, no synovitis   Data Reviewed: I have personally reviewed following labs and imaging studies Basic Metabolic Panel: Recent Labs  Lab 12/01/19 1720 12/02/19 0500  NA 133* 136  K 3.3* 3.7  CL 92* 104  CO2 21* 18*  GLUCOSE 200* 148*  BUN 72* 68*  CREATININE 4.11* 3.75*  CALCIUM 9.9 9.0  MG  --  1.5*   Liver Function Tests: Recent Labs  Lab 12/01/19 1720 12/02/19 0500  AST 48* 37  ALT 28 25  ALKPHOS 379* 295*  BILITOT 2.8* 2.4*  PROT 7.8 6.6  ALBUMIN 3.5 2.9*   Recent Labs  Lab 12/01/19 1720  LIPASE 44   Recent Labs  Lab 12/01/19 2200  AMMONIA 57*   Coagulation Profile: Recent Labs  Lab 12/01/19 2200  INR 1.8*   CBC: Recent Labs  Lab 12/01/19 1720 12/02/19 0500  WBC 5.9 4.8  NEUTROABS 4.1  --   HGB 15.8*  14.0  HCT 46.4* 42.0  MCV 89.7 94.2  PLT 145* 98*   Cardiac Enzymes: No results for input(s): CKTOTAL, CKMB, CKMBINDEX, TROPONINI in the last 168 hours. BNP: Invalid input(s): POCBNP CBG: Recent Labs  Lab 12/01/19 2218 12/02/19 0434 12/02/19 0749  GLUCAP 166* 147* 160*   HbA1C: Recent Labs    12/02/19 0500  HGBA1C 6.4*   Urine analysis:    Component Value Date/Time   COLORURINE YELLOW 12/02/2019 0033   APPEARANCEUR CLEAR 12/02/2019 0033   LABSPEC 1.020 12/02/2019 0033   PHURINE 5.0 12/02/2019 0033   GLUCOSEU NEGATIVE 12/02/2019 0033  HGBUR NEGATIVE 12/02/2019 0033   BILIRUBINUR NEGATIVE 12/02/2019 0033   KETONESUR NEGATIVE 12/02/2019 0033   PROTEINUR NEGATIVE 12/02/2019 0033   UROBILINOGEN 1.0 06/16/2014 2138   NITRITE NEGATIVE 12/02/2019 0033   LEUKOCYTESUR SMALL (A) 12/02/2019 0033   Sepsis Labs: @LABRCNTIP (procalcitonin:4,lacticidven:4) ) Recent Results (from the past 240 hour(s))  SARS Coronavirus 2 by RT PCR (hospital order, performed in [facility] hospital lab) Nasopharyngeal Nasopharyngeal Swab     Status: None   Collection Time: 12/01/19  7:43 PM   Specimen: Nasopharyngeal Swab  Result Value Ref Range Status   SARS Coronavirus 2 NEGATIVE NEGATIVE Final    Comment: (NOTE) SARS-CoV-2 target nucleic acids are NOT DETECTED.  The SARS-CoV-2 RNA is generally detectable in upper and lower respiratory specimens during the acute phase of infection. The lowest concentration of SARS-CoV-2 viral copies this assay can detect is 250 copies / mL. A negative result does not preclude SARS-CoV-2 infection and should not be used as the sole basis for treatment or other patient management decisions.  A negative result may occur with improper specimen collection / handling, submission of specimen other than nasopharyngeal swab, presence of viral mutation(s) within the areas targeted by this assay, and inadequate number of viral copies (<250 copies / mL). A negative  result must be combined with clinical observations, patient history, and epidemiological information.  Fact Sheet for Patients:   StrictlyIdeas.no  Fact Sheet for Healthcare Providers: BankingDealers.co.za  This test is not yet approved or  cleared by the [place] FDA and has been authorized for detection and/or diagnosis of SARS-CoV-2 by FDA under an Emergency Use Authorization (EUA).  This EUA will remain in effect (meaning this test can be used) for the duration of the COVID-19 declaration under Section 564(b)(1) of the Act, 21 U.S.C. section 360bbb-3(b)(1), unless the authorization is terminated or revoked sooner.  Performed at [facility], [place] [address] [place], [place] [address]      Scheduled Meds: . apixaban  5 mg Oral BID  . insulin aspart  0-6 Units Subcutaneous Q4H  . sodium chloride flush  3 mL Intravenous Q12H  . sodium chloride flush  3 mL Intravenous Q12H   Continuous Infusions: . sodium chloride    . famotidine (PEPCID) IV Stopped (12/01/19 2219)  . magnesium sulfate bolus IVPB 1 g (12/02/19 0844)    Procedures/Studies: CT ABDOMEN PELVIS WO CONTRAST  Result Date: 12/01/2019 CLINICAL DATA:  Abdominal pain with nausea EXAM: CT ABDOMEN AND PELVIS WITHOUT CONTRAST TECHNIQUE: Multidetector CT imaging of the abdomen and pelvis was performed following the standard protocol without IV contrast. COMPARISON:  None. FINDINGS: Lower chest: The visualized heart size within normal limits. No pericardial fluid/thickening. A small hiatal hernia is present. There is question of mild wall thickening seen at the distal GE junction. The visualized portions of the lungs are clear. Hepatobiliary: There is a nodular liver contour. The patient is status post cholecystectomy. Although limited due to lack of intravenous contrast. No focal hepatic lesion. Pancreas:  Unremarkable.  No surrounding inflammatory changes. Spleen: Normal  in size. Although limited due to the lack of intravenous contrast, normal in appearance. Adrenals/Urinary Tract: Both adrenal glands appear normal. The kidneys and collecting system appear normal without evidence of urinary tract calculus or hydronephrosis. Bladder is unremarkable. Stomach/Bowel: The stomach and small bowel are normal in appearance. There is scattered colonic diverticula without diverticulitis. Vascular/Lymphatic: There are no enlarged abdominal or pelvic lymph nodes. Scattered aortic atherosclerotic calcifications are seen without aneurysmal dilatation.  Again noted is infrarenal IVC filter at the L2 level. Reproductive: The patient is status post hysterectomy. No adnexal masses or collections seen. Other: Right lower abdominal mesh is seen. There is mild diffuse anasarca. A small amount of perihepatic ascites is noted. There is diffuse osteopenia. Musculoskeletal: No acute or significant osseous findings. IMPRESSION: Small hiatal hernia with question of mild wall thickening which could be due to mild esophagitis. Findings suggestive of cirrhosis. Small amount of perihepatic ascites. Aortic Atherosclerosis (ICD10-I70.0). Diverticulosis without diverticulitis. Electronically Signed   By: Prudencio Pair M.D.   On: 12/01/2019 19:11    Orson Eva, DO  [facility]  If 7PM-7AM, please contact night-coverage [email or web address] Password [email or web address] 12/02/2019, 9:09 AM   LOS: 1 day

## 2019-12-02 NOTE — ED Notes (Signed)
We have been unable to locate the pts husband  The phone number recording reports that  The phone is out of order.  Ems reported that the husband  Was coming in  But he never arrived  The pt is a resident of [facility] in [place] but had gone home for the weekend because her roommate at the [facility] had been ill for 3-4 days when the pt caught it,   We finally found a number for lora cox the daughter  She reports that she had no seen her mother for approx 6 months   She keeps up with her whenever emergency rooms call her about her motherscare  She has had multiple visits with random bone fractures  The pt is a diabetic and at one point whenever the daughter had her lliving  At her house   She cooked three meals a d ay had her mother walking 2 miles a day an after 6  Weeks the mother decided  To go back home  With her husband  When she left the daughters house they had fed her healthy and when she left there  Had been taken off diabetic meds now her blood sugar  Stays elevated  She has liver disease and has to have fluid drained frequently    The daughte warned [name] if her father comes to the ed  He is a Corporate investment banker  And acts like he cares for the pt but its all an act  She warned [name] also that if he came to the hospital not to be in the same room with him  He acts like he cares about her mother but only hurts her  pts husband number [phone]  [name] cox the daughters number [phone]. Pt has liver failure stage 5

## 2019-12-02 NOTE — ED Notes (Signed)
Lunch Trays Ordered @ 1101.

## 2019-12-02 NOTE — ED Notes (Signed)
Spoke with 5W, this RN informed pt bed assignment subject to change, this RN left contact number pending bed assignment.

## 2019-12-02 NOTE — ED Notes (Signed)
Pt denies having active diarrhea at this time.

## 2019-12-03 LAB — COMPREHENSIVE METABOLIC PANEL
ALT: 22 U/L (ref 0–44)
AST: 40 U/L (ref 15–41)
Albumin: 2.8 g/dL — ABNORMAL LOW (ref 3.5–5.0)
Alkaline Phosphatase: 275 U/L — ABNORMAL HIGH (ref 38–126)
Anion gap: 11 (ref 5–15)
BUN: 62 mg/dL — ABNORMAL HIGH (ref 8–23)
CO2: 18 mmol/L — ABNORMAL LOW (ref 22–32)
Calcium: 9.1 mg/dL (ref 8.9–10.3)
Chloride: 106 mmol/L (ref 98–111)
Creatinine, Ser: 2.98 mg/dL — ABNORMAL HIGH (ref 0.44–1.00)
GFR calc Af Amer: 16 mL/min — ABNORMAL LOW (ref >60)
GFR calc non Af Amer: 14 mL/min — ABNORMAL LOW (ref >60)
Glucose, Bld: 184 mg/dL — ABNORMAL HIGH (ref 70–99)
Potassium: 4 mmol/L (ref 3.5–5.1)
Sodium: 135 mmol/L (ref 135–145)
Total Bilirubin: 2.1 mg/dL — ABNORMAL HIGH (ref 0.3–1.2)
Total Protein: 6.1 g/dL — ABNORMAL LOW (ref 6.5–8.1)

## 2019-12-03 LAB — GASTROINTESTINAL PANEL BY PCR, STOOL (REPLACES STOOL CULTURE)

## 2019-12-03 LAB — UREA NITROGEN, URINE: Urea Nitrogen, Ur: 433 mg/dL

## 2019-12-03 LAB — CBC
HCT: 39.1 % (ref 36.0–46.0)
Hemoglobin: 13.4 g/dL (ref 12.0–15.0)
MCH: 30.5 pg (ref 26.0–34.0)
MCHC: 34.3 g/dL (ref 30.0–36.0)
MCV: 88.9 fL (ref 80.0–100.0)
Platelets: 87 10*3/uL — ABNORMAL LOW (ref 150–400)
RBC: 4.4 MIL/uL (ref 3.87–5.11)
RDW: 13.7 % (ref 11.5–15.5)
WBC: 4.7 10*3/uL (ref 4.0–10.5)
nRBC: 0 % (ref 0.0–0.2)

## 2019-12-03 LAB — GLUCOSE, CAPILLARY
Glucose-Capillary: 164 mg/dL — ABNORMAL HIGH (ref 70–99)
Glucose-Capillary: 156 mg/dL — ABNORMAL HIGH (ref 70–99)
Glucose-Capillary: 194 mg/dL — ABNORMAL HIGH (ref 70–99)
Glucose-Capillary: 152 mg/dL — ABNORMAL HIGH (ref 70–99)
Glucose-Capillary: 241 mg/dL — ABNORMAL HIGH (ref 70–99)

## 2019-12-03 LAB — MAGNESIUM: Magnesium: 2.1 mg/dL (ref 1.7–2.4)

## 2019-12-03 MED ORDER — LEVOTHYROXINE SODIUM 25 MCG PO TABS
137.0000 ug | ORAL_TABLET | Freq: Every day | ORAL | Status: DC
  Administered 2019-12-03 – 2019-12-06 (×4): 137 ug via ORAL
  Filled 2019-12-03 (×4): qty 1

## 2019-12-03 NOTE — Progress Notes (Addendum)
PROGRESS NOTE  CLAUDELL RHODY Hunter:[id number] DOB: 11-12-1934 DOA: 12/01/2019 PCP: [name], MD  Brief History:  85 y.o.femalewith medical history significant forhypertension, coronary artery disease, insulin-dependent diabetes mellitus, history of DVT on Eliquis, history of [name] with cirrhosis with ascites, and chronic kidney disease stage III, now presenting to the emergency department with nausea, vomiting, diarrhea, and generalized weakness. Patient had been living at a SNF until returning home with her husband 3 or 4 days ago. Since then, she has had nausea, nonbloody vomiting, and loose stools. There is report that her SNF roommate has had similar symptoms. Patient denies any abdominal pain, denies chest pain, and denies shortness of breath or cough. She denies fevers or chills. EMS was called out today and reports that systolic blood pressure dropped to 70 when the patient stood, recovering quickly when she was lowered. She was vomiting and treated with Zofran and 200 cc of saline prior to arrival in the ED.   ED Course:Upon arrival to the ED, patient is found to be afebrile, saturating well on room air, and with stable blood pressure. EKG features sinus rhythm with chronic RBBB. CT the abdomen and pelvis is notable for small hiatal hernia with wall thickening and question of mild esophagitis, cirrhosis, and no hydronephrosis. Chemistry panel features a chronic hyperbilirubinemia, mild hypokalemia, BUN 72, and creatinine 4.11, up from 1.2 last month. CBC with chronic mild thrombocytopenia and hemoglobin 15.8.Troponin is mildly elevated and decreasing. COVID-19 screening test not yet resulted. Blood cultures were collected in the ED and the patient was given 1.5 L of saline and multiple doses of Zofran.  Since arrival to the emergency department, the patient states that her vomiting and loose stools have seemed to improved.  She has not had any emesis or bowel  movement in the last 18-24 hours.   Assessment/Plan: Acute kidney injury superimposed on CKD IIIb -SCr is 4.11 on admission - apparent baseline of 1.0-1.2 -There is no hydronephrosis on CT and this is likely prerenal azotemia in setting of recent N/V/D -improving with IVF-->continue  Intractable vomiting/Esophagitis -Presents with 3-4 days of N/V/D without abdominal pain or bleeding -Her roommate has had same sxs and this is likely acute viral gastroenteritis -Check GI pathogen panel--pending - continue IVF - no recent abx to her knowledge -12/01/2019 CT abdomen mild thickening of the esophagus at the GE junction - continue PPI IV bid -clear liquids for now -UA neg for pyuria - [facility] consulted-->?esophagitis with CT findings, no improvement with conservative care  Hyperammonemia -ammonia 57--unclear clinical significance -pt is alert and awake and conversant although pleasantly confused -continue rifaximin and lactulose - recheck am ammonia  Liver Cirrhosiswith Ascites -Appears compensated - due to NASH -Holding torsemide, spironolactone -Restart lactulose -Continue rifaximin  Orthostatic hypotension -repeat orthostatics on 7/20-->remains positive -continue IVF  CAD; elevated troponin -No anginal complaints -Troponin mildly elevated and decreasing,  -Personally reviewed EKG unchanged RBBB  Insulin-dependent DM -A1c was 9.2% in April 2021 -Start with low-intensity SSI only in light of renal failure -Holding Lantus temporarily  Hypertension -BP at goal, reportedly dropped SBP to 70s on standing with EMS -Hold antihypertensives initially  History of DVT -Continue Eliquis   Near-syncope  - Per EMS report, SBP dropped to 70 on standing and recovered quickly; pt reports lightheadedness on standing without LOC  - EKG with SR, chronic RBBB  - Continue IVF hydration, up with assistance only for now -PT  eval-->SNF  Hypomagnesemia -  replete  Hyponatremia -due to volume depletion and AKI -continue IVF  Lactic Acidosis -due to volume depletion -afebrile and hemodynamically stable      Status is: Inpatient  Remains inpatient appropriate because:IV treatments appropriate due to intensity of illness or inability to take PO   Dispo: The patient is from: Home  Anticipated d/c is to: SNF  Anticipated d/c date is: 2 days  Patient currently is not medically stable to d/c.        Family Communication:  no Family at bedside  Consultants:  [facility]  Code Status:  DNR  DVT Prophylaxis: apixaban   Procedures: As Listed in Progress Note Above  Antibiotics: None  Subjective:  Patient continues to have 2-3 episodes of emesis without blood.  Denies cp, sob, abd pain, dysuria Objective: Vitals:   12/03/19 0408 12/03/19 0409 12/03/19 0730 12/03/19 1203  BP:  (!) 146/86 125/65 (!) 118/58  Pulse: 91 85 82 89  Resp:  20 17 18   Temp: 97.8 F (36.6 C) 97.8 F (36.6 C) 98.6 F (37 C) 98.1 F (36.7 C)  TempSrc: Oral Oral Oral Oral  SpO2:  98% 98% 97%  Weight:  59.6 kg    Height:        Intake/Output Summary (Last 24 hours) at 12/03/2019 1523 Last data filed at 12/03/2019 1505 Gross per 24 hour  Intake 979.37 ml  Output 2 ml  Net 977.37 ml   Weight change: -42.4 kg Exam:   General:  Pt is alert, follows commands appropriately, not in acute distress  HEENT: No icterus, No thrush, No neck mass, [place]/AT  Cardiovascular: RRR, S1/S2, no rubs, no gallops  Respiratory: CTA bilaterally, no wheezing, no crackles, no rhonchi  Abdomen: Soft/+BS, non tender, non distended, no guarding  Extremities: No edema, No lymphangitis, No petechiae, No rashes, no synovitis   Data Reviewed: I have personally reviewed following labs and imaging studies Basic Metabolic Panel: Recent Labs  Lab 12/01/19 1720 12/02/19 0500  12/03/19 0252  NA 133* 136 135  K 3.3* 3.7 4.0  CL 92* 104 106  CO2 21* 18* 18*  GLUCOSE 200* 148* 184*  BUN 72* 68* 62*  CREATININE 4.11* 3.75* 2.98*  CALCIUM 9.9 9.0 9.1  MG  --  1.5* 2.1   Liver Function Tests: Recent Labs  Lab 12/01/19 1720 12/02/19 0500 12/03/19 0252  AST 48* 37 40  ALT 28 25 22   ALKPHOS 379* 295* 275*  BILITOT 2.8* 2.4* 2.1*  PROT 7.8 6.6 6.1*  ALBUMIN 3.5 2.9* 2.8*   Recent Labs  Lab 12/01/19 1720  LIPASE 44   Recent Labs  Lab 12/01/19 2200  AMMONIA 57*   Coagulation Profile: Recent Labs  Lab 12/01/19 2200  INR 1.8*   CBC: Recent Labs  Lab 12/01/19 1720 12/02/19 0500 12/03/19 0252  WBC 5.9 4.8 4.7  NEUTROABS 4.1  --   --   HGB 15.8* 14.0 13.4  HCT 46.4* 42.0 39.1  MCV 89.7 94.2 88.9  PLT 145* 98* 87*   Cardiac Enzymes: No results for input(s): CKTOTAL, CKMB, CKMBINDEX, TROPONINI in the last 168 hours. BNP: Invalid input(s): POCBNP CBG: Recent Labs  Lab 12/02/19 1755 12/02/19 2034 12/03/19 0639 12/03/19 0732 12/03/19 1201  GLUCAP 149* 141* 164* 156* 194*   HbA1C: Recent Labs    12/02/19 0500  HGBA1C 6.4*   Urine analysis:    Component Value Date/Time   COLORURINE YELLOW 12/02/2019 0033   APPEARANCEUR CLEAR 12/02/2019 0033   LABSPEC 1.020 12/02/2019 0033  PHURINE 5.0 12/02/2019 0033   GLUCOSEU NEGATIVE 12/02/2019 0033   HGBUR NEGATIVE 12/02/2019 0033   BILIRUBINUR NEGATIVE 12/02/2019 0033   KETONESUR NEGATIVE 12/02/2019 0033   PROTEINUR NEGATIVE 12/02/2019 0033   UROBILINOGEN 1.0 06/16/2014 2138   NITRITE NEGATIVE 12/02/2019 0033   LEUKOCYTESUR SMALL (A) 12/02/2019 0033   Sepsis Labs: @LABRCNTIP (procalcitonin:4,lacticidven:4) ) Recent Results (from the past 240 hour(s))  Blood culture (routine x 2)     Status: None (Preliminary result)   Collection Time: 12/01/19  6:37 PM   Specimen: BLOOD RIGHT WRIST  Result Value Ref Range Status   Specimen Description BLOOD RIGHT WRIST  Final   Special  Requests   Final    BOTTLES DRAWN AEROBIC AND ANAEROBIC Blood Culture results may not be optimal due to an inadequate volume of blood received in culture bottles   Culture   Final    NO GROWTH 2 DAYS Performed at [facility], [place] [address] [place], [place] [address]    Report Status PENDING  Incomplete  SARS Coronavirus 2 by RT PCR (hospital order, performed in [facility] hospital lab) Nasopharyngeal Nasopharyngeal Swab     Status: None   Collection Time: 12/01/19  7:43 PM   Specimen: Nasopharyngeal Swab  Result Value Ref Range Status   SARS Coronavirus 2 NEGATIVE NEGATIVE Final    Comment: (NOTE) SARS-CoV-2 target nucleic acids are NOT DETECTED.  The SARS-CoV-2 RNA is generally detectable in upper and lower respiratory specimens during the acute phase of infection. The lowest concentration of SARS-CoV-2 viral copies this assay can detect is 250 copies / mL. A negative result does not preclude SARS-CoV-2 infection and should not be used as the sole basis for treatment or other patient management decisions.  A negative result may occur with improper specimen collection / handling, submission of specimen other than nasopharyngeal swab, presence of viral mutation(s) within the areas targeted by this assay, and inadequate number of viral copies (<250 copies / mL). A negative result must be combined with clinical observations, patient history, and epidemiological information.  Fact Sheet for Patients:   StrictlyIdeas.no  Fact Sheet for Healthcare Providers: BankingDealers.co.za  This test is not yet approved or  cleared by the [place] FDA and has been authorized for detection and/or diagnosis of SARS-CoV-2 by FDA under an Emergency Use Authorization (EUA).  This EUA will remain in effect (meaning this test can be used) for the duration of the COVID-19 declaration under Section 564(b)(1) of the Act, 21 U.S.C. section  360bbb-3(b)(1), unless the authorization is terminated or revoked sooner.  Performed at [facility], [place] [address] [place], [place] [address]   Blood culture (routine x 2)     Status: None (Preliminary result)   Collection Time: 12/01/19  9:39 PM   Specimen: BLOOD  Result Value Ref Range Status   Specimen Description BLOOD RIGHT FOREARM  Final   Special Requests   Final    BOTTLES DRAWN AEROBIC AND ANAEROBIC Blood Culture adequate volume   Culture   Final    NO GROWTH 1 DAY Performed at [facility], [place] [address], [place], [place] [address]    Report Status PENDING  Incomplete     Scheduled Meds: . apixaban  5 mg Oral BID  . insulin aspart  0-5 Units Subcutaneous QHS  . insulin aspart  0-6 Units Subcutaneous TID WC  . lactulose  30 g Oral BID  . levothyroxine  137 mcg Oral Q0600  . pantoprazole (  PROTONIX) IV  40 mg Intravenous Q12H  . rifaximin  550 mg Oral BID  . sodium chloride flush  3 mL Intravenous Q12H  . sodium chloride flush  3 mL Intravenous Q12H   Continuous Infusions: . sodium chloride    . 0.9 % NaCl with KCl 20 mEq / L 75 mL/hr at 12/03/19 0001  . famotidine (PEPCID) IV 20 mg (12/03/19 1201)    Procedures/Studies: CT ABDOMEN PELVIS WO CONTRAST  Result Date: 12/01/2019 CLINICAL DATA:  Abdominal pain with nausea EXAM: CT ABDOMEN AND PELVIS WITHOUT CONTRAST TECHNIQUE: Multidetector CT imaging of the abdomen and pelvis was performed following the standard protocol without IV contrast. COMPARISON:  None. FINDINGS: Lower chest: The visualized heart size within normal limits. No pericardial fluid/thickening. A small hiatal hernia is present. There is question of mild wall thickening seen at the distal GE junction. The visualized portions of the lungs are clear. Hepatobiliary: There is a nodular liver contour. The patient is status post cholecystectomy. Although limited due to lack of intravenous contrast. No focal hepatic lesion. Pancreas:  Unremarkable.   No surrounding inflammatory changes. Spleen: Normal in size. Although limited due to the lack of intravenous contrast, normal in appearance. Adrenals/Urinary Tract: Both adrenal glands appear normal. The kidneys and collecting system appear normal without evidence of urinary tract calculus or hydronephrosis. Bladder is unremarkable. Stomach/Bowel: The stomach and small bowel are normal in appearance. There is scattered colonic diverticula without diverticulitis. Vascular/Lymphatic: There are no enlarged abdominal or pelvic lymph nodes. Scattered aortic atherosclerotic calcifications are seen without aneurysmal dilatation. Again noted is infrarenal IVC filter at the L2 level. Reproductive: The patient is status post hysterectomy. No adnexal masses or collections seen. Other: Right lower abdominal mesh is seen. There is mild diffuse anasarca. A small amount of perihepatic ascites is noted. There is diffuse osteopenia. Musculoskeletal: No acute or significant osseous findings. IMPRESSION: Small hiatal hernia with question of mild wall thickening which could be due to mild esophagitis. Findings suggestive of cirrhosis. Small amount of perihepatic ascites. Aortic Atherosclerosis (ICD10-I70.0). Diverticulosis without diverticulitis. Electronically Signed   By: Prudencio Pair M.D.   On: 12/01/2019 19:11   CUP PACEART REMOTE DEVICE CHECK  Result Date: 12/02/2019 Carelink summary report received. Battery status OK. Normal device function. No new symptom episodes, tachy episodes, brady, or pause episodes. No new AF episodes. Monthly summary reports and ROV/PRN   Orson Eva, DO  [facility]  If 7PM-7AM, please contact night-coverage [email or web address] Password [email or web address] 12/03/2019, 3:23 PM   LOS: 2 days
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
## 2019-12-03 NOTE — Progress Notes (Signed)
Patient received to the unit. Patient is alert and oriented x2, iv in place and running fluid. Skin assessment done with another nurse. Given instructions about call bell and phone. Bed in low position and call bell in reach.

## 2019-12-03 NOTE — Discharge Instructions (Addendum)
Follow with Primary MD [name], MD in 7 days   Get CBC, CMP, 2 view Chest X ray -  checked next visit within 1 week by Primary MD    Activity: As tolerated with Full fall precautions use walker/cane & assistance as needed  Disposition Home   Diet: Heart Healthy Soft  Diet with feeding assistance and aspiration precautions.   Special Instructions: If you have smoked or chewed Tobacco  in the last 2 yrs please stop smoking, stop any regular Alcohol  and or any Recreational drug use.  On your next visit with your primary care physician please Get Medicines reviewed and adjusted.  Please request your Prim.MD to go over all Hospital Tests and Procedure/Radiological results at the follow up, please get all Hospital records sent to your Prim MD by signing hospital release before you go home.  If you experience worsening of your admission symptoms, develop shortness of breath, life threatening emergency, suicidal or homicidal thoughts you must seek medical attention immediately by calling 911 or calling your MD immediately  if symptoms less severe.  You Must read complete instructions/literature along with all the possible adverse reactions/side effects for all the Medicines you take and that have been prescribed to you. Take any new Medicines after you have completely understood and accpet all the possible adverse reactions/side effects.    Information on my medicine - ELIQUIS (apixaban)  Why was Eliquis prescribed for you? Eliquis was prescribed to treat blood clots that were been found in the veins of your legs (deep vein thrombosis) in the past, and to reduce the risk of them occurring again.  What do You need to know about Eliquis ? Your current dose is ONE 5 mg tablet taken TWICE daily.  Eliquis may be taken with or without food.   Try to take the dose about the same time in the morning and in the evening. If you have difficulty swallowing the tablet whole please discuss with  your pharmacist how to take the medication safely.  Take Eliquis exactly as prescribed and DO NOT stop taking Eliquis without talking to the doctor who prescribed the medication.  Stopping may increase your risk of developing a new blood clot.  Refill your prescription before you run out.  After discharge, you should have regular check-up appointments with your healthcare provider that is prescribing your Eliquis.    What do you do if you miss a dose? If a dose of ELIQUIS is not taken at the scheduled time, take it as soon as possible on the same day and twice-daily administration should be resumed. The dose should not be doubled to make up for a missed dose.  Important Safety Information A possible side effect of Eliquis is bleeding. You should call your healthcare provider right away if you experience any of the following: ? Bleeding from an injury or your nose that does not stop. ? Unusual colored urine (red or dark brown) or unusual colored stools (red or black). ? Unusual bruising for unknown reasons. ? A serious fall or if you hit your head (even if there is no bleeding).  Some medicines may interact with Eliquis and might increase your risk of bleeding or clotting while on Eliquis. To help avoid this, consult your healthcare provider or pharmacist prior to using any new prescription or non-prescription medications, including herbals, vitamins, non-steroidal anti-inflammatory drugs (NSAIDs) and supplements.  This website has more information on Eliquis (apixaban): http://www.eliquis.com/eliquis/home
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
## 2019-12-04 DIAGNOSIS — L8990 Pressure ulcer of unspecified site, unspecified stage: Secondary | ICD-10-CM | POA: Insufficient documentation

## 2019-12-04 LAB — GLUCOSE, CAPILLARY
Glucose-Capillary: 168 mg/dL — ABNORMAL HIGH (ref 70–99)
Glucose-Capillary: 262 mg/dL — ABNORMAL HIGH (ref 70–99)
Glucose-Capillary: 165 mg/dL — ABNORMAL HIGH (ref 70–99)
Glucose-Capillary: 175 mg/dL — ABNORMAL HIGH (ref 70–99)

## 2019-12-04 LAB — COMPREHENSIVE METABOLIC PANEL
ALT: 27 U/L (ref 0–44)
AST: 42 U/L — ABNORMAL HIGH (ref 15–41)
Albumin: 2.7 g/dL — ABNORMAL LOW (ref 3.5–5.0)
Alkaline Phosphatase: 281 U/L — ABNORMAL HIGH (ref 38–126)
Anion gap: 9 (ref 5–15)
BUN: 47 mg/dL — ABNORMAL HIGH (ref 8–23)
CO2: 18 mmol/L — ABNORMAL LOW (ref 22–32)
Calcium: 9.2 mg/dL (ref 8.9–10.3)
Chloride: 111 mmol/L (ref 98–111)
Creatinine, Ser: 2.16 mg/dL — ABNORMAL HIGH (ref 0.44–1.00)
GFR calc Af Amer: 24 mL/min — ABNORMAL LOW (ref >60)
GFR calc non Af Amer: 21 mL/min — ABNORMAL LOW (ref >60)
Glucose, Bld: 176 mg/dL — ABNORMAL HIGH (ref 70–99)
Potassium: 3.6 mmol/L (ref 3.5–5.1)
Sodium: 138 mmol/L (ref 135–145)
Total Bilirubin: 1.9 mg/dL — ABNORMAL HIGH (ref 0.3–1.2)
Total Protein: 5.8 g/dL — ABNORMAL LOW (ref 6.5–8.1)

## 2019-12-04 LAB — CBC
HCT: 38.2 % (ref 36.0–46.0)
Hemoglobin: 13 g/dL (ref 12.0–15.0)
MCH: 30.7 pg (ref 26.0–34.0)
MCHC: 34 g/dL (ref 30.0–36.0)
MCV: 90.1 fL (ref 80.0–100.0)
Platelets: 94 10*3/uL — ABNORMAL LOW (ref 150–400)
RBC: 4.24 MIL/uL (ref 3.87–5.11)
RDW: 14.1 % (ref 11.5–15.5)
WBC: 4.7 10*3/uL (ref 4.0–10.5)
nRBC: 0 % (ref 0.0–0.2)

## 2019-12-04 LAB — C-REACTIVE PROTEIN: CRP: <0.5 mg/dL (ref <1.0)

## 2019-12-04 LAB — AMMONIA: Ammonia: 66 umol/L — ABNORMAL HIGH (ref 9–35)

## 2019-12-04 LAB — BRAIN NATRIURETIC PEPTIDE: B Natriuretic Peptide: 161.2 pg/mL — ABNORMAL HIGH (ref 0.0–100.0)

## 2019-12-04 MED ORDER — BENEPROTEIN PO POWD
1.0000 | Freq: Three times a day (TID) | ORAL | Status: DC
  Administered 2019-12-04 – 2019-12-06 (×5): 6 g via ORAL
  Filled 2019-12-04 (×2): qty 227

## 2019-12-04 MED ORDER — POTASSIUM CHLORIDE IN NACL 20-0.9 MEQ/L-% IV SOLN: INTRAVENOUS | Status: DC

## 2019-12-04 MED ORDER — ASCORBIC ACID 500 MG PO TABS
500.0000 mg | ORAL_TABLET | Freq: Two times a day (BID) | ORAL | Status: DC
  Administered 2019-12-04 – 2019-12-06 (×5): 500 mg via ORAL
  Filled 2019-12-04 (×5): qty 1

## 2019-12-04 MED ORDER — FERROUS SULFATE 325 (65 FE) MG PO TABS
325.0000 mg | ORAL_TABLET | Freq: Every day | ORAL | Status: DC
  Administered 2019-12-05 – 2019-12-06 (×2): 325 mg via ORAL
  Filled 2019-12-04 (×2): qty 1

## 2019-12-04 MED ORDER — LACTULOSE 10 GM/15ML PO SOLN
20.0000 g | Freq: Two times a day (BID) | ORAL | Status: DC
  Administered 2019-12-04 – 2019-12-06 (×4): 20 g via ORAL
  Filled 2019-12-04 (×4): qty 30

## 2019-12-04 MED ORDER — NITROGLYCERIN 0.4 MG SL SUBL: 0.4000 mg | SUBLINGUAL_TABLET | SUBLINGUAL | Status: DC | PRN

## 2019-12-04 NOTE — Progress Notes (Signed)
VAST consulted to obtain IV access. Pt has had 4 IV's in 2 days, 3 of which have been removed by patient. Spoke with pt's nurse, Janett Billow who stated pt is confused at times and pulled her IV out this morning. Discussed possible need for mittens to maintain IV access.  Attempted to assess left arm. However, pt winced in pain when trying to rotate arm outward. Pt reported history of surgery on wrist within last few months. Unable to complete assessment of left arm for IV placement. Pt's lower and upper right arm is extremely bruised; assessed utilizing [name].; able to start an IV in right lower arm as charted in flowsheets. Wrapped IV loosely with gauze and instructed Janett Billow where to expect infiltration if it occurs (d/t catheter being 1.75 inches in length). Reiterated importance of patient not pulling on line.
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
## 2019-12-04 NOTE — Progress Notes (Signed)
Carelink Summary Report / Loop Recorder 

## 2019-12-04 NOTE — Plan of Care (Signed)
Patient A&O x2. VSS.  Free from falls. Patient refusing mobility. Pt tolerating fluids and meals. Pt voiding adequately during shift. Two loose bowel movement during shift. DVT prophylactic: Eliquis. No c/o of pain. No c/o of nausea/vomiting. MIVF. Bed wheels locked. Phone and call bell within reach. Pt is resting, no distress.   Problem: Education: Goal: Knowledge of General Education information will improve Description: Including pain rating scale, medication(s)/side effects and non-pharmacologic comfort measures Outcome: Progressing   Problem: Health Behavior/Discharge Planning: Goal: Ability to manage health-related needs will improve Outcome: Progressing   Problem: Clinical Measurements: Goal: Ability to maintain clinical measurements within normal limits will improve Outcome: Progressing Goal: Will remain free from infection Outcome: Progressing Goal: Diagnostic test results will improve Outcome: Progressing Goal: Respiratory complications will improve Outcome: Progressing Goal: Cardiovascular complication will be avoided Outcome: Progressing   Problem: Activity: Goal: Risk for activity intolerance will decrease Outcome: Progressing   Problem: Nutrition: Goal: Adequate nutrition will be maintained Outcome: Progressing   Problem: Coping: Goal: Level of anxiety will decrease Outcome: Progressing   Problem: Elimination: Goal: Will not experience complications related to bowel motility Outcome: Progressing Goal: Will not experience complications related to urinary retention Outcome: Progressing   Problem: Pain Managment: Goal: General experience of comfort will improve Outcome: Progressing   Problem: Safety: Goal: Ability to remain free from injury will improve Outcome: Progressing   Problem: Skin Integrity: Goal: Risk for impaired skin integrity will decrease Outcome: Progressing
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
## 2019-12-04 NOTE — Progress Notes (Signed)
PT Cancellation Note  Patient Details Name: Tonya Hunter MRN: [id number] DOB: [birth year]   Cancelled Treatment:    Reason Eval/Treat Not Completed: Patient declined, no reason specified  Patient refused to work with PT at this time, "because I just got comfortable." Reminded patient that she was adamant 12/03/19 that she does not want to return to SNF and wants to go home from hospital. Encouraged her to participate with PT in order to maintain her strength and mobility to improve her chances of discharging home (not SNF). She continued to adamantly refuse. RN made aware.   Arby Barrette, PT Pager [phone]  [name] 12/04/2019, 11:04 AM
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
## 2019-12-05 LAB — COMPREHENSIVE METABOLIC PANEL
ALT: 28 U/L (ref 0–44)
AST: 40 U/L (ref 15–41)
Albumin: 2.5 g/dL — ABNORMAL LOW (ref 3.5–5.0)
Alkaline Phosphatase: 273 U/L — ABNORMAL HIGH (ref 38–126)
Anion gap: 8 (ref 5–15)
BUN: 34 mg/dL — ABNORMAL HIGH (ref 8–23)
CO2: 19 mmol/L — ABNORMAL LOW (ref 22–32)
Calcium: 9.2 mg/dL (ref 8.9–10.3)
Chloride: 107 mmol/L (ref 98–111)
Creatinine, Ser: 1.75 mg/dL — ABNORMAL HIGH (ref 0.44–1.00)
GFR calc Af Amer: 31 mL/min — ABNORMAL LOW (ref >60)
GFR calc non Af Amer: 26 mL/min — ABNORMAL LOW (ref >60)
Glucose, Bld: 182 mg/dL — ABNORMAL HIGH (ref 70–99)
Potassium: 3.7 mmol/L (ref 3.5–5.1)
Sodium: 134 mmol/L — ABNORMAL LOW (ref 135–145)
Total Bilirubin: 1.9 mg/dL — ABNORMAL HIGH (ref 0.3–1.2)
Total Protein: 5.7 g/dL — ABNORMAL LOW (ref 6.5–8.1)

## 2019-12-05 LAB — CBC
HCT: 37.1 % (ref 36.0–46.0)
Hemoglobin: 12.9 g/dL (ref 12.0–15.0)
MCH: 31.5 pg (ref 26.0–34.0)
MCHC: 34.8 g/dL (ref 30.0–36.0)
MCV: 90.7 fL (ref 80.0–100.0)
Platelets: 102 10*3/uL — ABNORMAL LOW (ref 150–400)
RBC: 4.09 MIL/uL (ref 3.87–5.11)
RDW: 14.2 % (ref 11.5–15.5)
WBC: 4.6 10*3/uL (ref 4.0–10.5)
nRBC: 0 % (ref 0.0–0.2)

## 2019-12-05 LAB — BRAIN NATRIURETIC PEPTIDE: B Natriuretic Peptide: 135.6 pg/mL — ABNORMAL HIGH (ref 0.0–100.0)

## 2019-12-05 LAB — GLUCOSE, CAPILLARY
Glucose-Capillary: 159 mg/dL — ABNORMAL HIGH (ref 70–99)
Glucose-Capillary: 159 mg/dL — ABNORMAL HIGH (ref 70–99)
Glucose-Capillary: 162 mg/dL — ABNORMAL HIGH (ref 70–99)
Glucose-Capillary: 170 mg/dL — ABNORMAL HIGH (ref 70–99)

## 2019-12-05 LAB — C-REACTIVE PROTEIN: CRP: <0.5 mg/dL (ref <1.0)

## 2019-12-05 LAB — TSH: TSH: 1.288 u[IU]/mL (ref 0.350–4.500)

## 2019-12-05 LAB — AMMONIA: Ammonia: 51 umol/L — ABNORMAL HIGH (ref 9–35)

## 2019-12-05 LAB — MAGNESIUM: Magnesium: 1.6 mg/dL — ABNORMAL LOW (ref 1.7–2.4)

## 2019-12-05 MED ORDER — POTASSIUM CHLORIDE CRYS ER 20 MEQ PO TBCR
40.0000 meq | EXTENDED_RELEASE_TABLET | Freq: Once | ORAL | Status: AC
  Administered 2019-12-05: 40 meq via ORAL
  Filled 2019-12-05: qty 2

## 2019-12-05 MED ORDER — POTASSIUM CHLORIDE IN NACL 20-0.9 MEQ/L-% IV SOLN
INTRAVENOUS | Status: AC
  Filled 2019-12-05 (×3): qty 1000

## 2019-12-05 MED ORDER — MAGNESIUM SULFATE 2 GM/50ML IV SOLN
2.0000 g | Freq: Once | INTRAVENOUS | Status: AC
  Administered 2019-12-05: 2 g via INTRAVENOUS
  Filled 2019-12-05: qty 50

## 2019-12-05 NOTE — Consult Note (Signed)
   [facility] CM Inpatient Consult   12/05/2019  Tonya Hunter 1935/03/08 [id number]   Patient chart reviewed for potential [place] [facility] CM) services due to high unplanned readmission risk score, 28%.   Patient disposition not set at this time. Will continue to follow for progression and disposition plans. Of note, [facility] Care Management services does not replace or interfere with any services that are arranged by inpatient case management or social work.  [name], MSN, [facility] Liaison Nurse Mobile Phone [phone]  Toll free office [phone]

## 2019-12-05 NOTE — TOC Progression Note (Addendum)
Transition of Care [facility]) - Progression Note    Patient Details  Name: Tonya Hunter MRN: [id number] Date of Birth: 1934-09-23  Transition of Care [facility]) CM/SW [place], LCSW Phone Number: 12/05/2019, 1:32 PM  Clinical Narrative:    1:32pm-CSW left voicemail for patient's spouse on the home number as there was no voicemail available on the cell number.   3pm-CSW received return call from patient's spouse. He reported that he would like to take patient back home rather than return to SNF as that is patient's wish as well. He stated that patient's PCP is Dr. [name] and that he has arranged home health. CSW made him aware that Chanda Busing reported that patient had been set up with [facility] home health. CSW will confirm with [facility]. Patient's spouse confirmed that patient has all needed DME at home. He will be home tomorrow if patient discharges and requests PTAR.     Expected Discharge Plan: Skilled Nursing Facility Barriers to Discharge: Family Issues  Expected Discharge Plan and Services Expected Discharge Plan: [place] arrangements for the past 2 months: Single Family Home                                       Social Determinants of Health (SDOH) Interventions    Readmission Risk Interventions Readmission Risk Prevention Plan 09/16/2019  Transportation Screening Complete  PCP or Specialist Appt within 5-7 Days Complete  Home Care Screening Complete  Medication Review (RN CM) Complete  Some recent data might be hidden
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
## 2019-12-05 NOTE — Plan of Care (Addendum)
Patient A&O x2. VSS.  Free from falls.  Pt tolerating fluids and meals. Pt voiding adequately during shift. No bowel movement during shift. DVT prophylactic: Eliquis. No c/o of pain. No c/o of nausea/vomiting. Potassium and magnesium supplemented during shift. MIVF. Bed wheels locked. Phone and call bell within reach. Pt is resting, no distress.   Problem: Education: Goal: Knowledge of General Education information will improve Description: Including pain rating scale, medication(s)/side effects and non-pharmacologic comfort measures Outcome: Progressing   Problem: Health Behavior/Discharge Planning: Goal: Ability to manage health-related needs will improve Outcome: Progressing   Problem: Clinical Measurements: Goal: Ability to maintain clinical measurements within normal limits will improve Outcome: Progressing Goal: Will remain free from infection Outcome: Progressing Goal: Diagnostic test results will improve Outcome: Progressing Goal: Cardiovascular complication will be avoided Outcome: Progressing   Problem: Activity: Goal: Risk for activity intolerance will decrease Outcome: Progressing   Problem: Nutrition: Goal: Adequate nutrition will be maintained Outcome: Progressing   Problem: Coping: Goal: Level of anxiety will decrease Outcome: Progressing   Problem: Elimination: Goal: Will not experience complications related to bowel motility Outcome: Progressing Goal: Will not experience complications related to urinary retention Outcome: Progressing   Problem: Pain Managment: Goal: General experience of comfort will improve Outcome: Progressing   Problem: Safety: Goal: Ability to remain free from injury will improve Outcome: Progressing   Problem: Skin Integrity: Goal: Risk for impaired skin integrity will decrease Outcome: Progressing
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
## 2019-12-05 NOTE — Plan of Care (Signed)
  Problem: Clinical Measurements: Goal: Ability to maintain clinical measurements within normal limits will improve Outcome: Progressing Goal: Will remain free from infection Outcome: Progressing Goal: Diagnostic test results will improve Outcome: Progressing Goal: Cardiovascular complication will be avoided Outcome: Progressing   Problem: Coping: Goal: Level of anxiety will decrease Outcome: Progressing   Problem: Clinical Measurements: Goal: Respiratory complications will improve Outcome: Not Applicable

## 2019-12-06 LAB — URINE DRUGS OF ABUSE SCREEN W ALC, ROUTINE (REF LAB)
Amphetamines, Urine: NEGATIVE ng/mL
Barbiturate, Ur: NEGATIVE ng/mL
Benzodiazepine Quant, Ur: NEGATIVE ng/mL
Cannabinoid Quant, Ur: NEGATIVE ng/mL
Cocaine (Metab.): NEGATIVE ng/mL
Ethanol U, Quan: NEGATIVE %
Methadone Screen, Urine: NEGATIVE ng/mL
Opiate Quant, Ur: NEGATIVE ng/mL
Phencyclidine, Ur: NEGATIVE ng/mL
Propoxyphene, Urine: NEGATIVE ng/mL

## 2019-12-06 LAB — CULTURE, BLOOD (ROUTINE X 2): Culture: NO GROWTH

## 2019-12-06 LAB — GLUCOSE, CAPILLARY
Glucose-Capillary: 182 mg/dL — ABNORMAL HIGH (ref 70–99)
Glucose-Capillary: 215 mg/dL — ABNORMAL HIGH (ref 70–99)

## 2019-12-06 MED ORDER — TORSEMIDE 10 MG PO TABS: 10.0000 mg | ORAL_TABLET | Freq: Every day | ORAL | 0 refills | Status: AC

## 2019-12-06 MED ORDER — LACTULOSE 10 GM/15ML PO SOLN: 40.0000 g | Freq: Three times a day (TID) | ORAL | Status: AC

## 2019-12-06 MED ORDER — SPIRONOLACTONE 50 MG PO TABS: 50.0000 mg | ORAL_TABLET | Freq: Every day | ORAL | 0 refills | Status: AC
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
## 2019-12-06 NOTE — Plan of Care (Signed)
  Problem: Education: Goal: Knowledge of General Education information will improve Description: Including pain rating scale, medication(s)/side effects and non-pharmacologic comfort measures Outcome: Adequate for Discharge   Problem: Health Behavior/Discharge Planning: Goal: Ability to manage health-related needs will improve Outcome: Adequate for Discharge   Problem: Clinical Measurements: Goal: Ability to maintain clinical measurements within normal limits will improve Outcome: Adequate for Discharge   Problem: Clinical Measurements: Goal: Will remain free from infection Outcome: Adequate for Discharge   Problem: Clinical Measurements: Goal: Diagnostic test results will improve Outcome: Adequate for Discharge   Problem: Clinical Measurements: Goal: Cardiovascular complication will be avoided Outcome: Adequate for Discharge   Problem: Clinical Measurements: Goal: Will remain free from infection Outcome: Adequate for Discharge   Problem: Clinical Measurements: Goal: Diagnostic test results will improve Outcome: Adequate for Discharge   Problem: Clinical Measurements: Goal: Cardiovascular complication will be avoided Outcome: Adequate for Discharge   Problem: Activity: Goal: Risk for activity intolerance will decrease Outcome: Adequate for Discharge   Problem: Nutrition: Goal: Adequate nutrition will be maintained Outcome: Adequate for Discharge   Problem: Coping: Goal: Level of anxiety will decrease Outcome: Adequate for Discharge   Problem: Elimination: Goal: Will not experience complications related to bowel motility Outcome: Adequate for Discharge   Problem: Elimination: Goal: Will not experience complications related to urinary retention Outcome: Adequate for Discharge   Problem: Pain Managment: Goal: General experience of comfort will improve Outcome: Adequate for Discharge   Problem: Safety: Goal: Ability to remain free from injury will  improve Outcome: Adequate for Discharge   Problem: Skin Integrity: Goal: Risk for impaired skin integrity will decrease Outcome: Adequate for Discharge
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
## 2019-12-06 NOTE — Discharge Summary (Signed)
[name]:[id number] DOB: 1934-12-29 DOA: 12/01/2019  PCP: [name], MD  Admit date: 12/01/2019  Discharge date: 12/06/2019  Admitted From: Home   Disposition:  Home (refused our strong recommendation of going to SNF.)   Recommendations for Outpatient Follow-up:   Follow up with PCP in 1-2 weeks  PCP Please obtain BMP/CBC, 2 view CXR in 1week,  (see Discharge instructions)   PCP Please follow up on the following pending results: CBC, BMP, magnesium, ammonia, A1c, blood pressure.  Diuretic dose.   Home Health: RN, PT , SW Equipment/Devices: None  Consultations: None  Discharge Condition: Stable  CODE STATUS: Full   Diet Recommendation: Soft Low Carb - Heart Healthy   Diet Order            Diet clear liquid Room service appropriate? Yes; Fluid consistency: Thin  Diet effective now                  Chief Complaint  Patient presents with  . Emesis     Brief history of present illness from the day of admission and additional interim summary    85 y.o.femalewith medical history significant forhypertension, coronary artery disease, insulin-dependent diabetes mellitus, history of DVT on Eliquis, history of Tonya Hunter with cirrhosis with ascites, and chronic kidney disease stage III, now presenting to the emergency department with nausea, vomiting, diarrhea, and generalized weakness. Patient had been living at a SNF until returning home with her husband 3 or 4 days prior to hospital visit.                                                                  Hospital Course    Acute kidney injury with mild metabolic acidosis and elevated lactic acid levels due to severe dehydration on top of superimposed on CKD IIIb - caused by intractable nausea vomiting due to esophagitis, also was on high-dose  diuretics, baseline creatinine around 1.3, with hydration renal function is improving.  CT abdomen pelvis does not show any signs of obstruction.  The renal function is improved, we resume her diuretics at lower than home dose, we strongly recommended that she goes to SNF for a monitored discharge however patient is lucid and refused to be placed to SNF and wants to go home with her husband.  Request PCP to monitor blood pressure, CBC, CMP, ammonia and diuretic dose closely.   Intractable vomiting/Esophagitis -Presents with 3-4 days of N/V/D without abdominal pain or bleeding, on IV PPI, GI pathogen panel negative, CT abdomen pelvis shows changes suggestive of esophagitis, however GI was consulted who recommended PPI.  Outpatient follow-up with GI as needed.   Hyperammonemia - with history of underlying NASH and cirrhosis.  Diuretics held due to AKI and dehydration, improved with Xifaxan and lactulose on which she  will be discharged.  PCP to monitor.  Again we recommended SNF for monitored setting discharge however patient and husband refused.   Orthostatic hypotension causing near syncope due to dehydration- improved after hydration, reduce home dose diuretics.   CAD; elevated troponin - no acute issues.   Hypertension- stabilized after IV fluids.   History of DVT -Continue Eliquis   Insulin-dependent DM-  A1c was 9.2% in April 2021, new home regimen requested to check CBGs before every meal at bedtime and follow with PCP.    Note patient is lucid, husband is Tonya Hunter along with patient.  Daughter is POA has been revoked.   SpO2: 100 %  Recent Labs  Lab 12/01/19 1943 12/04/19 0506 12/04/19 1211 12/05/19 0455  CRP  --   --  <0.5 <0.5  BNP  --  161.2*  --  135.6*  SARSCOV2NAA NEGATIVE  --   --   --     Hepatic Function Latest Ref Rng & Units 12/05/2019 12/04/2019 12/03/2019  Total Protein 6.5 - 8.1 g/dL 5.7(L) 5.8(L) 6.1(L)  Albumin 3.5 - 5.0 g/dL 2.5(L)  2.7(L) 2.8(L)  AST 15 - 41 U/L 40 42(H) 40  ALT 0 - 44 U/L 28 27 22   Alk Phosphatase 38 - 126 U/L 273(H) 281(H) 275(H)  Total Bilirubin 0.3 - 1.2 mg/dL 1.9(H) 1.9(H) 2.1(H)  Bilirubin, Direct 0.0 - 0.2 mg/dL - - -       Discharge diagnosis     Principal Problem:   Acute renal failure superimposed on stage 3 chronic kidney disease (HCC) Active Problems:   Diabetes mellitus type 2, insulin dependent (HCC)   Essential hypertension   CAD (coronary artery disease)   NASH (nonalcoholic steatohepatitis)   Acute gastroenteritis   History of CVA (cerebrovascular accident)   Elevated troponin   Hypokalemia   Thrombocytopenia (HCC)   Cirrhosis of liver with ascites (HCC)   History of DVT (deep vein thrombosis)   Coagulopathy (HCC)   Acute renal failure superimposed on stage 3b chronic kidney disease (HCC)   Pressure injury of skin    Discharge instructions    Discharge Instructions    Discharge instructions   Complete by: As directed    Follow with Primary MD [name], MD in 7 days   Get CBC, CMP, 2 view Chest X ray -  checked next visit within 1 week by Primary MD    Activity: As tolerated with Full fall precautions use walker/cane & assistance as needed  Disposition Home   Diet: Heart Healthy Low Carb Soft  Diet with feeding assistance and aspiration precautions.  Accuchecks 4 times/day, Once in AM empty stomach and then before each meal. Log in all results and show them to your Prim.MD in 3 days. If any glucose reading is under 80 or above 300 call your Prim MD immidiately. Follow Low glucose instructions for glucose under 80 as instructed.    Special Instructions: If you have smoked or chewed Tobacco  in the last 2 yrs please stop smoking, stop any regular Alcohol  and or any Recreational drug use.  On your next visit with your primary care physician please Get Medicines reviewed and adjusted.  Please request your Prim.MD to go over all Hospital Tests and  Procedure/Radiological results at the follow up, please get all Hospital records sent to your Prim MD by signing hospital release before you go home.  If you experience worsening of your admission symptoms, develop shortness of breath, life threatening emergency, suicidal or homicidal  thoughts you must seek medical attention immediately by calling 911 or calling your MD immediately  if symptoms less severe.  You Must read complete instructions/literature along with all the possible adverse reactions/side effects for all the Medicines you take and that have been prescribed to you. Take any new Medicines after you have completely understood and accpet all the possible adverse reactions/side effects.   Increase activity slowly   Complete by: As directed    No wound care   Complete by: As directed       Discharge Medications   Allergies as of 12/06/2019      Reactions   Penicillins Swelling, Other (See Comments)   Caused weakness and swelling of the feet Has patient had a PCN reaction causing immediate rash, facial/tongue/throat swelling, SOB or lightheadedness with hypotension: Yes Has patient had a PCN reaction causing severe rash involving mucus membranes or skin necrosis: Unk Has patient had a PCN reaction that required hospitalization: Yes Has patient had a PCN reaction occurring within the last 10 years: No If all of the above answers are "NO", then may proceed with Cephalosporin use.   Demerol [meperidine] Nausea And Vomiting      Medication List    STOP taking these medications   methocarbamol 500 MG tablet Commonly known as: ROBAXIN   oxyCODONE 10 mg 12 hr tablet Commonly known as: OXYCONTIN   oxyCODONE 5 MG immediate release tablet Commonly known as: Oxy IR/ROXICODONE     TAKE these medications   alum & mag hydroxide-simeth 379-024-09 MG/5ML suspension Commonly known as: MAALOX/MYLANTA Take 30 mLs by mouth every 4 (four) hours as needed for indigestion.   apixaban 5  MG Tabs tablet Commonly known as: ELIQUIS Take 1 tablet (5 mg total) by mouth 2 (two) times daily.   ascorbic acid 500 MG tablet Commonly known as: VITAMIN C Take 1 tablet (500 mg total) by mouth 2 (two) times daily.   ferrous sulfate 325 (65 FE) MG tablet Take 1 tablet (325 mg total) by mouth daily with breakfast.   insulin glargine 100 UNIT/ML injection Commonly known as: LANTUS Inject 0.12 mLs (12 Units total) into the skin daily. What changed: when to take this   lactulose 10 GM/15ML solution Commonly known as: CHRONULAC Take 60 mLs (40 g total) by mouth 3 (three) times daily.   levothyroxine 137 MCG tablet Commonly known as: SYNTHROID Take 137 mcg by mouth daily before breakfast.   mouth rinse Liqd solution 15 mLs by Mouth Rinse route 2 (two) times daily. What changed: additional instructions   nitroGLYCERIN 0.4 MG SL tablet Commonly known as: NITROSTAT Place 1 tablet (0.4 mg total) under the tongue every 5 (five) minutes as needed for chest pain. Max 3 doses. What changed:   when to take this  additional instructions   pantoprazole 40 MG tablet Commonly known as: PROTONIX Take 40 mg by mouth 2 (two) times daily.   polyethylene glycol 17 g packet Commonly known as: MIRALAX / GLYCOLAX Take 17 g by mouth daily.   PreserVision AREDS 2 Caps Take 2 capsules by mouth 2 (two) times daily.   protein supplement Powd Take 6 g by mouth 3 (three) times daily with meals.   rifaximin 550 MG Tabs tablet Commonly known as: XIFAXAN Take 1 tablet (550 mg total) by mouth 2 (two) times daily.   spironolactone 50 MG tablet Commonly known as: ALDACTONE Take 1 tablet (50 mg total) by mouth daily. What changed: when to take this   torsemide  10 MG tablet Commonly known as: DEMADEX Take 1 tablet (10 mg total) by mouth daily. What changed:   medication strength  how much to take   traMADol 50 MG tablet Commonly known as: ULTRAM Take 1 tablet (50 mg total) by mouth  every 6 (six) hours as needed for moderate pain. What changed: reasons to take this   zinc sulfate 220 (50 Zn) MG capsule Take 1 capsule (220 mg total) by mouth daily.        Follow-up Information    [facility].   Specialty: Home Health Services Why: [place] resumed. They will contact you to schedule first visit.  Contact information: [address] [name] [address] [place] [address] [phone]        [name], MD. Schedule an appointment as soon as possible for a visit in 1 week(s).   Specialty: Internal Medicine Contact information: [place] [address] [phone]        Josue Hector, MD. Schedule an appointment as soon as possible for a visit in 1 week(s).   Specialty: Cardiology Contact information: [address] [place] [address] [phone]               Major procedures and Radiology Reports - PLEASE review detailed and final reports thoroughly  -       CT ABDOMEN PELVIS WO CONTRAST  Result Date: 12/01/2019 CLINICAL DATA:  Abdominal pain with nausea EXAM: CT ABDOMEN AND PELVIS WITHOUT CONTRAST TECHNIQUE: Multidetector CT imaging of the abdomen and pelvis was performed following the standard protocol without IV contrast. COMPARISON:  None. FINDINGS: Lower chest: The visualized heart size within normal limits. No pericardial fluid/thickening. A small hiatal hernia is present. There is question of mild wall thickening seen at the distal GE junction. The visualized portions of the lungs are clear. Hepatobiliary: There is a nodular liver contour. The patient is status post cholecystectomy. Although limited due to lack of intravenous contrast. No focal hepatic lesion. Pancreas:  Unremarkable.  No surrounding inflammatory changes. Spleen: Normal in size. Although limited due to the lack of intravenous contrast, normal in appearance. Adrenals/Urinary Tract: Both adrenal glands appear  normal. The kidneys and collecting system appear normal without evidence of urinary tract calculus or hydronephrosis. Bladder is unremarkable. Stomach/Bowel: The stomach and small bowel are normal in appearance. There is scattered colonic diverticula without diverticulitis. Vascular/Lymphatic: There are no enlarged abdominal or pelvic lymph nodes. Scattered aortic atherosclerotic calcifications are seen without aneurysmal dilatation. Again noted is infrarenal IVC filter at the L2 level. Reproductive: The patient is status post hysterectomy. No adnexal masses or collections seen. Other: Right lower abdominal mesh is seen. There is mild diffuse anasarca. A small amount of perihepatic ascites is noted. There is diffuse osteopenia. Musculoskeletal: No acute or significant osseous findings. IMPRESSION: Small hiatal hernia with question of mild wall thickening which could be due to mild esophagitis. Findings suggestive of cirrhosis. Small amount of perihepatic ascites. Aortic Atherosclerosis (ICD10-I70.0). Diverticulosis without diverticulitis. Electronically Signed   By: Prudencio Pair M.D.   On: 12/01/2019 19:11   CUP PACEART REMOTE DEVICE CHECK  Result Date: 12/02/2019 Care[facility] summary report received. Battery status OK. Normal device function. No new symptom episodes, tachy episodes, brady, or pause episodes. No new AF episodes. Monthly summary reports and ROV/PRN   Micro Results     Recent Results (from the past 240 hour(s))  Blood culture (routine x 2)  Status: None   Collection Time: 12/01/19  6:37 PM   Specimen: BLOOD RIGHT WRIST  Result Value Ref Range Status   Specimen Description BLOOD RIGHT WRIST  Final   Special Requests   Final    BOTTLES DRAWN AEROBIC AND ANAEROBIC Blood Culture results may not be optimal due to an inadequate volume of blood received in culture bottles   Culture   Final    NO GROWTH 5 DAYS Performed at [facility], [place] [address], [place], [place] [address]     Report Status 12/06/2019 FINAL  Final  SARS Coronavirus 2 by RT PCR (hospital order, performed in [facility] hospital lab) Nasopharyngeal Nasopharyngeal Swab     Status: None   Collection Time: 12/01/19  7:43 PM   Specimen: Nasopharyngeal Swab  Result Value Ref Range Status   SARS Coronavirus 2 NEGATIVE NEGATIVE Final    Comment: (NOTE) SARS-CoV-2 target nucleic acids are NOT DETECTED.  The SARS-CoV-2 RNA is generally detectable in upper and lower respiratory specimens during the acute phase of infection. The lowest concentration of SARS-CoV-2 viral copies this assay can detect is 250 copies / mL. A negative result does not preclude SARS-CoV-2 infection and should not be used as the sole basis for treatment or other patient management decisions.  A negative result may occur with improper specimen collection / handling, submission of specimen other than nasopharyngeal swab, presence of viral mutation(s) within the areas targeted by this assay, and inadequate number of viral copies (<250 copies / mL). A negative result must be combined with clinical observations, patient history, and epidemiological information.  Fact Sheet for Patients:   StrictlyIdeas.no  Fact Sheet for Healthcare Providers: BankingDealers.co.za  This test is not yet approved or  cleared by the [place] FDA and has been authorized for detection and/or diagnosis of SARS-CoV-2 by FDA under an Emergency Use Authorization (EUA).  This EUA will remain in effect (meaning this test can be used) for the duration of the COVID-19 declaration under Section 564(b)(1) of the Act, 21 U.S.C. section 360bbb-3(b)(1), unless the authorization is terminated or revoked sooner.  Performed at [facility], [place] [address] [place], [place] [address]   Blood culture (routine x 2)     Status: None (Preliminary result)   Collection Time: 12/01/19  9:39 PM   Specimen: BLOOD    Result Value Ref Range Status   Specimen Description BLOOD RIGHT FOREARM  Final   Special Requests   Final    BOTTLES DRAWN AEROBIC AND ANAEROBIC Blood Culture adequate volume   Culture   Final    NO GROWTH 4 DAYS Performed at [facility], [place] [address], [place], [place] [address]    Report Status PENDING  Incomplete  Gastrointestinal Panel by PCR , Stool     Status: None   Collection Time: 12/02/19 11:40 AM   Specimen: Stool  Result Value Ref Range Status   Campylobacter species NOT DETECTED NOT DETECTED Final   Plesimonas shigelloides NOT DETECTED NOT DETECTED Final   Salmonella species NOT DETECTED NOT DETECTED Final   Yersinia enterocolitica NOT DETECTED NOT DETECTED Final   Vibrio species NOT DETECTED NOT DETECTED Final   Vibrio cholerae NOT DETECTED NOT DETECTED Final   Enteroaggregative E coli (EAEC) NOT DETECTED NOT DETECTED Final   Enteropathogenic E coli (EPEC) NOT DETECTED NOT DETECTED Final   Enterotoxigenic E coli (ETEC) NOT DETECTED NOT DETECTED Final   Shiga like toxin producing E coli (STEC) NOT  DETECTED NOT DETECTED Final   Shigella/Enteroinvasive E coli (EIEC) NOT DETECTED NOT DETECTED Final   Cryptosporidium NOT DETECTED NOT DETECTED Final   Cyclospora cayetanensis NOT DETECTED NOT DETECTED Final   Entamoeba histolytica NOT DETECTED NOT DETECTED Final   Giardia lamblia NOT DETECTED NOT DETECTED Final   Adenovirus F40/41 NOT DETECTED NOT DETECTED Final   Astrovirus NOT DETECTED NOT DETECTED Final   Norovirus GI/GII NOT DETECTED NOT DETECTED Final   Rotavirus A NOT DETECTED NOT DETECTED Final   Sapovirus (I, II, IV, and V) NOT DETECTED NOT DETECTED Final    Comment: Performed at [facility], [address], [place], [place] [address]    Today   Subjective    Tonya Hunter today has no headache,no chest abdominal pain,no new weakness tingling or numbness, feels much better wants to go home today.     Objective   Blood pressure (!)  144/89, pulse 76, temperature 97.8 F (36.6 C), temperature source Oral, resp. rate 18, height 5' 7"  (1.702 m), weight 63 kg, SpO2 100 %.   Intake/Output Summary (Last 24 hours) at 12/06/2019 1055 Last data filed at 12/06/2019 0600 Gross per 24 hour  Intake 1952.85 ml  Output 301 ml  Net 1651.85 ml    Exam  Awake Alert, No new F.N deficits,   [place].AT,PERRAL Supple Neck,No JVD, No cervical lymphadenopathy appriciated.  Symmetrical Chest wall movement, Good air movement bilaterally, CTAB RRR,No Gallops,Rubs or new Murmurs, No Parasternal Heave +ve B.Sounds, Abd Soft, Non tender, No organomegaly appriciated, No rebound -guarding or rigidity. No Cyanosis, Clubbing or edema, No new Rash or bruise   Data Review   CBC w Diff:  Lab Results  Component Value Date   WBC 4.6 12/05/2019   HGB 12.9 12/05/2019   HGB 11.7 09/09/2016   HCT 37.1 12/05/2019   HCT 24.3 (L) 09/16/2019   PLT 102 (L) 12/05/2019   PLT 206 09/09/2016   LYMPHOPCT 21 12/01/2019   MONOPCT 8 12/01/2019   EOSPCT 1 12/01/2019   BASOPCT 0 12/01/2019    CMP:  Lab Results  Component Value Date   NA 134 (L) 12/05/2019   NA 138 04/18/2018   K 3.7 12/05/2019   CL 107 12/05/2019   CO2 19 (L) 12/05/2019   BUN 34 (H) 12/05/2019   BUN 18 04/18/2018   CREATININE 1.75 (H) 12/05/2019   PROT 5.7 (L) 12/05/2019   ALBUMIN 2.5 (L) 12/05/2019   BILITOT 1.9 (H) 12/05/2019   ALKPHOS 273 (H) 12/05/2019   AST 40 12/05/2019   ALT 28 12/05/2019  .   Total Time in preparing paper work, data evaluation and todays exam - 69 minutes  Lala Lund M.D on 12/06/2019 at 10:55 AM  [facility]   Office  [phone]

## 2019-12-06 NOTE — TOC Progression Note (Signed)
Transition of Care [facility]) - Progression Note    Patient Details  Name: Tonya Hunter MRN: [id number] Date of Birth: Apr 17, 1935  Transition of Care [facility]) CM/SW [place], LCSW Phone Number: 12/06/2019, 9:38 AM  Clinical Narrative:    CSW received call from patient's daughter, [name]. She expressed frustration after speaking with the CIR social worker that her father was told that she had POA back when he was in [place] and that was how it ended up being voided. She did not want him to know that information and was allowing him to make decisions at that time but wanted to have the POA just in case her mother declined and she needed to step in. CSW provided supportive listening. She reported that patient's PCP, DR. [name], had kicked patient out of the practice due to the spouse displaying behaviors. She wanted to make sure patient would have home health set up. CSW explained that she can go through the court system to contest the [place]. She stated she would consult with her father in law, who is a Chief Executive Officer. CSW provided her with contact info for [facility] [employer].   CSW confirmed that [facility] is active with patient for PT/OT. Will need home health resumption orders. CSW also spoke with patient's PCP, Dr. [name]. He reported that he was often caught in the middle of whether he was allowed to speak to the patient's spouse versus her daughter as patient often changed her mind. He confirmed that he is still the patient's PCP and she can return at any time. He did reflect that patient did seem to do better health-wise when in the care of her daughter.    Expected Discharge Plan: Skilled Nursing Facility Barriers to Discharge: Family Issues  Expected Discharge Plan and Services Expected Discharge Plan: [place] arrangements for the past 2 months: Single Family Home                                       Social Determinants of Health (SDOH)  Interventions    Readmission Risk Interventions Readmission Risk Prevention Plan 09/16/2019  Transportation Screening Complete  PCP or Specialist Appt within 5-7 Days Complete  Home Care Screening Complete  Medication Review (RN CM) Complete  Some recent data might be hidden

## 2019-12-06 NOTE — Consult Note (Signed)
   [facility] CM Inpatient Consult   12/06/2019  Tonya Hunter May 25, 1934 [id number]  [facility] Follow up:  Attempted to speak with patient by telephone to explain [facility] CM services as outpatient follow up to assess for chronic disease management and assess for community needs. No answer in hospital room number nor noted mobile number. [facility] CM referral placed for RN assessment of community needs.   Of note, [facility] Care Management services does not replace or interfere with any services that are arranged by inpatient case management or social work.  [name], MSN, [place] [facility] Liaison Nurse Mobile Phone [phone]  Toll free office [phone]

## 2019-12-06 NOTE — TOC Transition Note (Addendum)
Transition of Care [facility]) - CM/SW Discharge Note   Patient Details  Name: Tonya Hunter MRN: [id number] Date of Birth: 01-Nov-1934  Transition of Care [facility]) CM/SW Contact:  Benard [place], LCSW Phone Number: 12/06/2019, 11:58 AM   Clinical Narrative:    Patient will DC to: Home Anticipated DC date: 12/06/19 Family notified: Spouse aware and will be home Transport by: [facility] 1:30pm  Please make sure patient's glasses and cell phone go with her.   Per MD patient ready for DC to home. RN, patient, patient's family, and home health notified of DC as well as [facility], [place]. DC packet on chart. Ambulance transport requested for patient.   CSW will sign off for now as social work intervention is no longer needed. Please consult [name] again if new needs arise.      Final next level of care: [place] Barriers to Discharge: Barriers Resolved   Patient Goals and CMS Choice Patient states their goals for this hospitalization and ongoing recovery are:: Patient not forthcoming wit informaiton CMS Medicare.gov Compare Post Acute Care list provided to:: Patient Represenative (must comment) (Spouse) Choice offered to / list presented to : Spouse  Discharge Placement                Patient to be transferred to facility by: [place] Name of family member notified: Spouse Patient and family notified of of transfer: 12/06/19  Discharge Plan and Services                DME Arranged: N/A DME Agency: NA       HH Arranged: RN, PT, OT, Social Work [place] Agency: [facility] Date [place]: 12/05/19 Time [place]: 1400 Representative spoke with at [place]: [place] ([place]) Interventions     Readmission Risk Interventions Readmission Risk Prevention Plan 09/16/2019  Transportation Screening Complete  PCP or Specialist Appt within 5-7 Days Complete  Home Care Screening Complete  Medication Review (RN CM) Complete  Some  recent data might be hidden

## 2019-12-06 NOTE — Progress Notes (Signed)
NURSING PROGRESS NOTE  Tonya Hunter [id number] Discharge Data: 12/06/2019 2:00 PM Attending Provider: Thurnell Lose, MD CHY:IFOYDXA, Delfino Lovett, MD     [name] to be D/C'd Home via [facility] per MD order.  Discussed with the patient the After Visit Summary and all questions fully answered. All IV's discontinued with no bleeding noted. All belongings returned to patient for patient to take home. Pt dressings changed prior to discharge.  Last Vital Signs:  Blood pressure 98/71, pulse 79, temperature 98.1 F (36.7 C), temperature source Oral, resp. rate 18, height 5' 7"  (1.702 m), weight 63 kg, SpO2 100 %.  Discharge Medication List Allergies as of 12/06/2019      Reactions   Penicillins Swelling, Other (See Comments)   Caused weakness and swelling of the feet Has patient had a PCN reaction causing immediate rash, facial/tongue/throat swelling, SOB or lightheadedness with hypotension: Yes Has patient had a PCN reaction causing severe rash involving mucus membranes or skin necrosis: Unk Has patient had a PCN reaction that required hospitalization: Yes Has patient had a PCN reaction occurring within the last 10 years: No If all of the above answers are "NO", then may proceed with Cephalosporin use.   Demerol [meperidine] Nausea And Vomiting      Medication List    STOP taking these medications   methocarbamol 500 MG tablet Commonly known as: ROBAXIN   oxyCODONE 10 mg 12 hr tablet Commonly known as: OXYCONTIN   oxyCODONE 5 MG immediate release tablet Commonly known as: Oxy IR/ROXICODONE     TAKE these medications   alum & mag hydroxide-simeth 128-786-76 MG/5ML suspension Commonly known as: MAALOX/MYLANTA Take 30 mLs by mouth every 4 (four) hours as needed for indigestion.   apixaban 5 MG Tabs tablet Commonly known as: ELIQUIS Take 1 tablet (5 mg total) by mouth 2 (two) times daily.   ascorbic acid 500 MG tablet Commonly known as: VITAMIN C Take 1 tablet (500 mg total)  by mouth 2 (two) times daily.   ferrous sulfate 325 (65 FE) MG tablet Take 1 tablet (325 mg total) by mouth daily with breakfast.   insulin glargine 100 UNIT/ML injection Commonly known as: LANTUS Inject 0.12 mLs (12 Units total) into the skin daily. What changed: when to take this   lactulose 10 GM/15ML solution Commonly known as: CHRONULAC Take 60 mLs (40 g total) by mouth 3 (three) times daily.   levothyroxine 137 MCG tablet Commonly known as: SYNTHROID Take 137 mcg by mouth daily before breakfast.   mouth rinse Liqd solution 15 mLs by Mouth Rinse route 2 (two) times daily. What changed: additional instructions   nitroGLYCERIN 0.4 MG SL tablet Commonly known as: NITROSTAT Place 1 tablet (0.4 mg total) under the tongue every 5 (five) minutes as needed for chest pain. Max 3 doses. What changed:   when to take this  additional instructions   pantoprazole 40 MG tablet Commonly known as: PROTONIX Take 40 mg by mouth 2 (two) times daily.   polyethylene glycol 17 g packet Commonly known as: MIRALAX / GLYCOLAX Take 17 g by mouth daily.   PreserVision AREDS 2 Caps Take 2 capsules by mouth 2 (two) times daily.   protein supplement Powd Take 6 g by mouth 3 (three) times daily with meals.   rifaximin 550 MG Tabs tablet Commonly known as: XIFAXAN Take 1 tablet (550 mg total) by mouth 2 (two) times daily.   spironolactone 50 MG tablet Commonly known as: ALDACTONE Take 1 tablet (50  mg total) by mouth daily. What changed: when to take this   torsemide 10 MG tablet Commonly known as: DEMADEX Take 1 tablet (10 mg total) by mouth daily. What changed:   medication strength  how much to take   traMADol 50 MG tablet Commonly known as: ULTRAM Take 1 tablet (50 mg total) by mouth every 6 (six) hours as needed for moderate pain. What changed: reasons to take this   zinc sulfate 220 (50 Zn) MG capsule Take 1 capsule (220 mg total) by mouth daily.

## 2019-12-07 LAB — CULTURE, BLOOD (ROUTINE X 2)
Culture: NO GROWTH
Special Requests: ADEQUATE

## 2019-12-09 ENCOUNTER — Other Ambulatory Visit: Payer: Self-pay | Admitting: *Deleted

## 2019-12-09 NOTE — Patient Outreach (Signed)
[facility]) Care Management  12/09/2019  Tonya Hunter 1934/12/27 [id number]   Referral received from hospital liaison as member was recently discharged from hospital on 7/23 with acute renal failure.  Noted that recommendation was for SNF but she and husband refused.  Per chart, she also has history of HTN, CAD, CHF, Stroke, Cirrhosis, and DM.  Call placed to member, husband answered the phone, stated no services were needed.  Member was heard in the background and requested to speak with this care manager.  Identity verified, [facility] care management services explained.  She report [facility] would start coming to the home today, but she appears confused as to what to expect.  State once they were there "everything will be ok."  Report her husband won't have to do anything else.  Role of home health explained, she state she understands but continue to speak as if they will be coming to the home daily and staying throughout the day.  Again explained role and expectations of home health, she state "ok, they just need to come on so we can have things taken care of."    Attempted again to explain role of [facility] and complete assessment, she repeatedly state [facility] is coming.  Call ended at that time, member informed that this care manager will call back within the next few days to follow up on home health assessment.    Call placed to [facility], message left for assigned clinicians to contact this care manager for collaboration of care.  Will await call back.  Valente David, [place], MSN [place] [phone]

## 2019-12-10 ENCOUNTER — Ambulatory Visit: Payer: Self-pay

## 2019-12-10 ENCOUNTER — Other Ambulatory Visit: Payer: Self-pay | Admitting: *Deleted

## 2019-12-10 NOTE — Patient Outreach (Signed)
[facility]) Care Management  12/10/2019  LAINEE LEHRMAN 14-Mar-1935 [id number]   Voice message received back from PT with [facility], Tillie Rung.  She stated that member was not admitted to home health due to concern for level of care needs.  Message stated that member and husband were needing assistance with medication administration as well as bathing and repositioning in bed.  Assessment indicated that member would need more assistance with basic ADL's, per [facility] referral was placed to home care division rather than home health.  Also noted per Tillie Rung that member and husband were considering hospice.  This care manager will place referrals to [place].  Will follow up with member within the next 2 days as planned.  Valente David, [place], MSN [place] [phone]

## 2019-12-10 NOTE — Telephone Encounter (Signed)
  Reason for Disposition . [1] Other NON-URGENT information for PCP AND [2] does not require PCP response  Answer Assessment - Initial Assessment Questions 1. REASON FOR CALL or QUESTION: "What is your reason for calling today?" or "How can I best help you?" or "What question do you have that I can help answer?"     Please see note/ 2. CALLER: Document the source of call. (e.g., laboratory, patient).     Unknown who called in to [facility].  Protocols used: PCP CALL - NO TRIAGE-A-AH

## 2019-12-10 NOTE — Telephone Encounter (Signed)
Per pt  [facility] did not come visit her yesterday. Per pt she stated [facility] called and plans to visit today.  Pt stated that she was disappointed that they did not call. Pt and husband seemed to have difficulty with what is occurring and were not clear about the hx of events. Called Monica form [place] and she stated the PT from [facility] did a home visit and determined that the pt is more appropriate for PT but for home care and assistance with bathing and turning and care. Brayton Layman stated she made a referral to "remote health" for services.  Pt and husband refused SNF per [facility].    Per [facility] agent , received a call from a man and a woman demanding to speak to a nurse. The call was dropped prior to warm transfer.

## 2019-12-11 ENCOUNTER — Other Ambulatory Visit: Payer: Self-pay

## 2019-12-11 ENCOUNTER — Other Ambulatory Visit: Payer: Self-pay | Admitting: *Deleted

## 2019-12-11 ENCOUNTER — Emergency Department (HOSPITAL_COMMUNITY)
Admission: EM | Admit: 2019-12-11 | Discharge: 2019-12-12 | Disposition: A | Discharge status: Home / Self Care | Payer: Medicare Other | Attending: Emergency Medicine | Admitting: Emergency Medicine

## 2019-12-11 ENCOUNTER — Emergency Department (HOSPITAL_COMMUNITY): Payer: Medicare Other

## 2019-12-11 DIAGNOSIS — Z789 Other specified health status: Secondary | ICD-10-CM

## 2019-12-11 DIAGNOSIS — N390 Urinary tract infection, site not specified: Secondary | ICD-10-CM | POA: Diagnosis not present

## 2019-12-11 DIAGNOSIS — Z955 Presence of coronary angioplasty implant and graft: Secondary | ICD-10-CM | POA: Insufficient documentation

## 2019-12-11 DIAGNOSIS — I110 Hypertensive heart disease with heart failure: Secondary | ICD-10-CM | POA: Insufficient documentation

## 2019-12-11 DIAGNOSIS — Z7901 Long term (current) use of anticoagulants: Secondary | ICD-10-CM | POA: Insufficient documentation

## 2019-12-11 DIAGNOSIS — I10 Essential (primary) hypertension: Secondary | ICD-10-CM | POA: Diagnosis not present

## 2019-12-11 DIAGNOSIS — R627 Adult failure to thrive: Secondary | ICD-10-CM | POA: Diagnosis not present

## 2019-12-11 DIAGNOSIS — I509 Heart failure, unspecified: Secondary | ICD-10-CM | POA: Diagnosis not present

## 2019-12-11 DIAGNOSIS — R69 Illness, unspecified: Secondary | ICD-10-CM | POA: Diagnosis not present

## 2019-12-11 DIAGNOSIS — Z794 Long term (current) use of insulin: Secondary | ICD-10-CM | POA: Diagnosis not present

## 2019-12-11 DIAGNOSIS — Z96653 Presence of artificial knee joint, bilateral: Secondary | ICD-10-CM | POA: Insufficient documentation

## 2019-12-11 DIAGNOSIS — Z20822 Contact with and (suspected) exposure to covid-19: Secondary | ICD-10-CM | POA: Insufficient documentation

## 2019-12-11 DIAGNOSIS — E119 Type 2 diabetes mellitus without complications: Secondary | ICD-10-CM | POA: Diagnosis not present

## 2019-12-11 DIAGNOSIS — Z609 Problem related to social environment, unspecified: Secondary | ICD-10-CM | POA: Insufficient documentation

## 2019-12-11 DIAGNOSIS — I2510 Atherosclerotic heart disease of native coronary artery without angina pectoris: Secondary | ICD-10-CM | POA: Insufficient documentation

## 2019-12-11 DIAGNOSIS — E039 Hypothyroidism, unspecified: Secondary | ICD-10-CM | POA: Insufficient documentation

## 2019-12-11 DIAGNOSIS — R5381 Other malaise: Secondary | ICD-10-CM | POA: Diagnosis not present

## 2019-12-11 DIAGNOSIS — J986 Disorders of diaphragm: Secondary | ICD-10-CM | POA: Diagnosis not present

## 2019-12-11 LAB — CBC WITH DIFFERENTIAL/PLATELET
Abs Immature Granulocytes: 0.02 10*3/uL (ref 0.00–0.07)
Basophils Absolute: 0 10*3/uL (ref 0.0–0.1)
Basophils Relative: 0 %
Eosinophils Absolute: 0.1 10*3/uL (ref 0.0–0.5)
Eosinophils Relative: 2 %
HCT: 39.2 % (ref 36.0–46.0)
Hemoglobin: 13.6 g/dL (ref 12.0–15.0)
Immature Granulocytes: 0 %
Lymphocytes Relative: 26 %
Lymphs Abs: 1.2 10*3/uL (ref 0.7–4.0)
MCH: 31.5 pg (ref 26.0–34.0)
MCHC: 34.7 g/dL (ref 30.0–36.0)
MCV: 90.7 fL (ref 80.0–100.0)
Monocytes Absolute: 0.5 10*3/uL (ref 0.1–1.0)
Monocytes Relative: 10 %
Neutro Abs: 2.8 10*3/uL (ref 1.7–7.7)
Neutrophils Relative %: 62 %
Platelets: 136 10*3/uL — ABNORMAL LOW (ref 150–400)
RBC: 4.32 MIL/uL (ref 3.87–5.11)
RDW: 14.6 % (ref 11.5–15.5)
WBC: 4.5 10*3/uL (ref 4.0–10.5)
nRBC: 0 % (ref 0.0–0.2)

## 2019-12-11 LAB — COMPREHENSIVE METABOLIC PANEL
ALT: 25 U/L (ref 0–44)
AST: 30 U/L (ref 15–41)
Albumin: 2.7 g/dL — ABNORMAL LOW (ref 3.5–5.0)
Alkaline Phosphatase: 310 U/L — ABNORMAL HIGH (ref 38–126)
Anion gap: 11 (ref 5–15)
BUN: 27 mg/dL — ABNORMAL HIGH (ref 8–23)
CO2: 22 mmol/L (ref 22–32)
Calcium: 10 mg/dL (ref 8.9–10.3)
Chloride: 100 mmol/L (ref 98–111)
Creatinine, Ser: 2.17 mg/dL — ABNORMAL HIGH (ref 0.44–1.00)
GFR calc Af Amer: 24 mL/min — ABNORMAL LOW (ref >60)
GFR calc non Af Amer: 20 mL/min — ABNORMAL LOW (ref >60)
Glucose, Bld: 141 mg/dL — ABNORMAL HIGH (ref 70–99)
Potassium: 3.7 mmol/L (ref 3.5–5.1)
Sodium: 133 mmol/L — ABNORMAL LOW (ref 135–145)
Total Bilirubin: 1.9 mg/dL — ABNORMAL HIGH (ref 0.3–1.2)
Total Protein: 6.2 g/dL — ABNORMAL LOW (ref 6.5–8.1)

## 2019-12-11 LAB — URINALYSIS, ROUTINE W REFLEX MICROSCOPIC
Bilirubin Urine: NEGATIVE
Glucose, UA: NEGATIVE mg/dL
Hgb urine dipstick: NEGATIVE
Ketones, ur: NEGATIVE mg/dL
Nitrite: NEGATIVE
Protein, ur: NEGATIVE mg/dL
Specific Gravity, Urine: 1.008 (ref 1.005–1.030)
pH: 5 (ref 5.0–8.0)

## 2019-12-11 LAB — SARS CORONAVIRUS 2 BY RT PCR (HOSPITAL ORDER, PERFORMED IN ~~LOC~~ HOSPITAL LAB): SARS Coronavirus 2: NEGATIVE

## 2019-12-11 LAB — AMMONIA: Ammonia: 17 umol/L (ref 9–35)

## 2019-12-11 LAB — CBG MONITORING, ED: Glucose-Capillary: 134 mg/dL — ABNORMAL HIGH (ref 70–99)

## 2019-12-11 LAB — LIPASE, BLOOD: Lipase: 58 U/L — ABNORMAL HIGH (ref 11–51)

## 2019-12-11 MED ORDER — LEVOTHYROXINE SODIUM 25 MCG PO TABS
137.0000 ug | ORAL_TABLET | Freq: Every day | ORAL | Status: DC
  Administered 2019-12-12: 137 ug via ORAL
  Filled 2019-12-11: qty 1

## 2019-12-11 MED ORDER — CEPHALEXIN 250 MG PO CAPS
500.0000 mg | ORAL_CAPSULE | Freq: Two times a day (BID) | ORAL | Status: DC
  Administered 2019-12-11 – 2019-12-12 (×2): 500 mg via ORAL
  Filled 2019-12-11 (×2): qty 2

## 2019-12-11 MED ORDER — LACTULOSE 10 GM/15ML PO SOLN
40.0000 g | Freq: Three times a day (TID) | ORAL | Status: DC
  Administered 2019-12-11: 40 g via ORAL
  Filled 2019-12-11: qty 60

## 2019-12-11 MED ORDER — RIFAXIMIN 550 MG PO TABS
550.0000 mg | ORAL_TABLET | Freq: Two times a day (BID) | ORAL | Status: DC
  Administered 2019-12-11 – 2019-12-12 (×2): 550 mg via ORAL
  Filled 2019-12-11 (×3): qty 1

## 2019-12-11 MED ORDER — APIXABAN 5 MG PO TABS
5.0000 mg | ORAL_TABLET | Freq: Two times a day (BID) | ORAL | Status: DC
  Administered 2019-12-11 – 2019-12-12 (×2): 5 mg via ORAL
  Filled 2019-12-11 (×3): qty 1

## 2019-12-11 MED ORDER — PANTOPRAZOLE SODIUM 40 MG PO TBEC
40.0000 mg | DELAYED_RELEASE_TABLET | Freq: Two times a day (BID) | ORAL | Status: DC
  Administered 2019-12-11 – 2019-12-12 (×2): 40 mg via ORAL
  Filled 2019-12-11 (×2): qty 1

## 2019-12-11 MED ORDER — TORSEMIDE 20 MG PO TABS
10.0000 mg | ORAL_TABLET | Freq: Every day | ORAL | Status: DC
  Administered 2019-12-11 – 2019-12-12 (×2): 10 mg via ORAL
  Filled 2019-12-11 (×2): qty 1

## 2019-12-11 MED ORDER — TRAMADOL HCL 50 MG PO TABS: 50.0000 mg | ORAL_TABLET | Freq: Four times a day (QID) | ORAL | Status: DC | PRN

## 2019-12-11 MED ORDER — ALUM & MAG HYDROXIDE-SIMETH 200-200-20 MG/5ML PO SUSP: 30.0000 mL | ORAL | Status: DC | PRN

## 2019-12-11 MED ORDER — FERROUS SULFATE 325 (65 FE) MG PO TABS
325.0000 mg | ORAL_TABLET | Freq: Every day | ORAL | Status: DC
  Administered 2019-12-12: 325 mg via ORAL
  Filled 2019-12-11: qty 1

## 2019-12-11 MED ORDER — SPIRONOLACTONE 25 MG PO TABS
50.0000 mg | ORAL_TABLET | Freq: Every day | ORAL | Status: DC
  Administered 2019-12-11 – 2019-12-12 (×2): 50 mg via ORAL
  Filled 2019-12-11 (×2): qty 2

## 2019-12-11 MED ORDER — ASCORBIC ACID 500 MG PO TABS
500.0000 mg | ORAL_TABLET | Freq: Two times a day (BID) | ORAL | Status: DC
  Administered 2019-12-11 – 2019-12-12 (×2): 500 mg via ORAL
  Filled 2019-12-11 (×2): qty 1

## 2019-12-11 MED ORDER — SODIUM CHLORIDE 0.9 % IV BOLUS
1000.0000 mL | Freq: Once | INTRAVENOUS | Status: AC
  Administered 2019-12-11: 1000 mL via INTRAVENOUS

## 2019-12-11 NOTE — ED Triage Notes (Signed)
Pt here via [facility] for failure to eat and take medications for the last few days - since she had a GI bug, per husband.  Discharged from snf 2 weeks ago.  [employer]ult protective services was at the home when ems arrived.  Husband had contacted [facility] to take pt (where she had been previously), but there were not beds.    BP 102/68 HR 85 RR 20 O2 96% ON RA CBG 139

## 2019-12-11 NOTE — Patient Outreach (Signed)
[facility]) Care Management  12/11/2019  Tonya Hunter 13-Apr-1935 [id number]   This RNCM planned to reach out to member and husband to follow up on management of care since discharge and recent PT/home health evaluation (services were not started due to level of care needed).  Noted that member is currently in the ED. Will follow up with member pending disposition.  Valente David, [place], MSN [place] [phone]

## 2019-12-11 NOTE — ED Notes (Signed)
Pt threw up the coke and medicines just given. Pt refusing to try to take them again.

## 2019-12-11 NOTE — ED Notes (Signed)
Patient denies pain and is resting comfortably.  
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
## 2019-12-12 DIAGNOSIS — Z20828 Contact with and (suspected) exposure to other viral communicable diseases: Secondary | ICD-10-CM | POA: Diagnosis not present

## 2019-12-12 DIAGNOSIS — E43 Unspecified severe protein-calorie malnutrition: Secondary | ICD-10-CM | POA: Diagnosis present

## 2019-12-12 DIAGNOSIS — G9341 Metabolic encephalopathy: Secondary | ICD-10-CM | POA: Diagnosis present

## 2019-12-12 DIAGNOSIS — I82402 Acute embolism and thrombosis of unspecified deep veins of left lower extremity: Secondary | ICD-10-CM | POA: Diagnosis not present

## 2019-12-12 DIAGNOSIS — I509 Heart failure, unspecified: Secondary | ICD-10-CM | POA: Diagnosis present

## 2019-12-12 DIAGNOSIS — E872 Acidosis: Secondary | ICD-10-CM | POA: Diagnosis present

## 2019-12-12 DIAGNOSIS — E119 Type 2 diabetes mellitus without complications: Secondary | ICD-10-CM | POA: Diagnosis not present

## 2019-12-12 DIAGNOSIS — E1169 Type 2 diabetes mellitus with other specified complication: Secondary | ICD-10-CM | POA: Diagnosis not present

## 2019-12-12 DIAGNOSIS — L89152 Pressure ulcer of sacral region, stage 2: Secondary | ICD-10-CM | POA: Diagnosis not present

## 2019-12-12 DIAGNOSIS — E1122 Type 2 diabetes mellitus with diabetic chronic kidney disease: Secondary | ICD-10-CM | POA: Diagnosis present

## 2019-12-12 DIAGNOSIS — R627 Adult failure to thrive: Secondary | ICD-10-CM | POA: Diagnosis not present

## 2019-12-12 DIAGNOSIS — K7200 Acute and subacute hepatic failure without coma: Secondary | ICD-10-CM | POA: Diagnosis present

## 2019-12-12 DIAGNOSIS — Z86718 Personal history of other venous thrombosis and embolism: Secondary | ICD-10-CM | POA: Diagnosis not present

## 2019-12-12 DIAGNOSIS — S82201D Unspecified fracture of shaft of right tibia, subsequent encounter for closed fracture with routine healing: Secondary | ICD-10-CM | POA: Diagnosis not present

## 2019-12-12 DIAGNOSIS — M2550 Pain in unspecified joint: Secondary | ICD-10-CM | POA: Diagnosis not present

## 2019-12-12 DIAGNOSIS — I499 Cardiac arrhythmia, unspecified: Secondary | ICD-10-CM | POA: Diagnosis not present

## 2019-12-12 DIAGNOSIS — D689 Coagulation defect, unspecified: Secondary | ICD-10-CM | POA: Diagnosis present

## 2019-12-12 DIAGNOSIS — K7460 Unspecified cirrhosis of liver: Secondary | ICD-10-CM | POA: Diagnosis present

## 2019-12-12 DIAGNOSIS — I2510 Atherosclerotic heart disease of native coronary artery without angina pectoris: Secondary | ICD-10-CM | POA: Diagnosis present

## 2019-12-12 DIAGNOSIS — K767 Hepatorenal syndrome: Secondary | ICD-10-CM | POA: Diagnosis present

## 2019-12-12 DIAGNOSIS — I739 Peripheral vascular disease, unspecified: Secondary | ICD-10-CM | POA: Diagnosis not present

## 2019-12-12 DIAGNOSIS — I82401 Acute embolism and thrombosis of unspecified deep veins of right lower extremity: Secondary | ICD-10-CM | POA: Diagnosis not present

## 2019-12-12 DIAGNOSIS — H353131 Nonexudative age-related macular degeneration, bilateral, early dry stage: Secondary | ICD-10-CM | POA: Diagnosis not present

## 2019-12-12 DIAGNOSIS — S72401D Unspecified fracture of lower end of right femur, subsequent encounter for closed fracture with routine healing: Secondary | ICD-10-CM | POA: Diagnosis not present

## 2019-12-12 DIAGNOSIS — K8689 Other specified diseases of pancreas: Secondary | ICD-10-CM | POA: Diagnosis not present

## 2019-12-12 DIAGNOSIS — E11621 Type 2 diabetes mellitus with foot ulcer: Secondary | ICD-10-CM | POA: Diagnosis not present

## 2019-12-12 DIAGNOSIS — R188 Other ascites: Secondary | ICD-10-CM | POA: Diagnosis present

## 2019-12-12 DIAGNOSIS — E039 Hypothyroidism, unspecified: Secondary | ICD-10-CM | POA: Diagnosis not present

## 2019-12-12 DIAGNOSIS — K7290 Hepatic failure, unspecified without coma: Secondary | ICD-10-CM | POA: Diagnosis not present

## 2019-12-12 DIAGNOSIS — I350 Nonrheumatic aortic (valve) stenosis: Secondary | ICD-10-CM | POA: Diagnosis present

## 2019-12-12 DIAGNOSIS — Z20822 Contact with and (suspected) exposure to covid-19: Secondary | ICD-10-CM | POA: Diagnosis present

## 2019-12-12 DIAGNOSIS — R5381 Other malaise: Secondary | ICD-10-CM | POA: Diagnosis not present

## 2019-12-12 DIAGNOSIS — F0390 Unspecified dementia without behavioral disturbance: Secondary | ICD-10-CM | POA: Diagnosis present

## 2019-12-12 DIAGNOSIS — R531 Weakness: Secondary | ICD-10-CM | POA: Diagnosis not present

## 2019-12-12 DIAGNOSIS — E440 Moderate protein-calorie malnutrition: Secondary | ICD-10-CM | POA: Diagnosis not present

## 2019-12-12 DIAGNOSIS — Z515 Encounter for palliative care: Secondary | ICD-10-CM | POA: Diagnosis not present

## 2019-12-12 DIAGNOSIS — E113593 Type 2 diabetes mellitus with proliferative diabetic retinopathy without macular edema, bilateral: Secondary | ICD-10-CM | POA: Diagnosis not present

## 2019-12-12 DIAGNOSIS — I130 Hypertensive heart and chronic kidney disease with heart failure and stage 1 through stage 4 chronic kidney disease, or unspecified chronic kidney disease: Secondary | ICD-10-CM | POA: Diagnosis present

## 2019-12-12 DIAGNOSIS — Z66 Do not resuscitate: Secondary | ICD-10-CM | POA: Diagnosis present

## 2019-12-12 DIAGNOSIS — R140 Abdominal distension (gaseous): Secondary | ICD-10-CM | POA: Diagnosis not present

## 2019-12-12 DIAGNOSIS — N1832 Chronic kidney disease, stage 3b: Secondary | ICD-10-CM | POA: Diagnosis not present

## 2019-12-12 DIAGNOSIS — K3184 Gastroparesis: Secondary | ICD-10-CM | POA: Diagnosis not present

## 2019-12-12 DIAGNOSIS — K7581 Nonalcoholic steatohepatitis (NASH): Secondary | ICD-10-CM | POA: Diagnosis not present

## 2019-12-12 DIAGNOSIS — A09 Infectious gastroenteritis and colitis, unspecified: Secondary | ICD-10-CM | POA: Diagnosis not present

## 2019-12-12 DIAGNOSIS — N184 Chronic kidney disease, stage 4 (severe): Secondary | ICD-10-CM | POA: Diagnosis present

## 2019-12-12 DIAGNOSIS — D72819 Decreased white blood cell count, unspecified: Secondary | ICD-10-CM | POA: Diagnosis not present

## 2019-12-12 DIAGNOSIS — R109 Unspecified abdominal pain: Secondary | ICD-10-CM | POA: Diagnosis not present

## 2019-12-12 DIAGNOSIS — Z7401 Bed confinement status: Secondary | ICD-10-CM | POA: Diagnosis not present

## 2019-12-12 DIAGNOSIS — E222 Syndrome of inappropriate secretion of antidiuretic hormone: Secondary | ICD-10-CM | POA: Diagnosis present

## 2019-12-12 DIAGNOSIS — E113553 Type 2 diabetes mellitus with stable proliferative diabetic retinopathy, bilateral: Secondary | ICD-10-CM | POA: Diagnosis not present

## 2019-12-12 DIAGNOSIS — J690 Pneumonitis due to inhalation of food and vomit: Secondary | ICD-10-CM | POA: Diagnosis not present

## 2019-12-12 DIAGNOSIS — K766 Portal hypertension: Secondary | ICD-10-CM | POA: Diagnosis present

## 2019-12-12 DIAGNOSIS — E876 Hypokalemia: Secondary | ICD-10-CM | POA: Diagnosis not present

## 2019-12-12 DIAGNOSIS — N179 Acute kidney failure, unspecified: Secondary | ICD-10-CM | POA: Diagnosis present

## 2019-12-12 DIAGNOSIS — I639 Cerebral infarction, unspecified: Secondary | ICD-10-CM | POA: Diagnosis not present

## 2019-12-12 DIAGNOSIS — I10 Essential (primary) hypertension: Secondary | ICD-10-CM | POA: Diagnosis not present

## 2019-12-12 DIAGNOSIS — K5730 Diverticulosis of large intestine without perforation or abscess without bleeding: Secondary | ICD-10-CM | POA: Diagnosis not present

## 2019-12-12 DIAGNOSIS — E871 Hypo-osmolality and hyponatremia: Secondary | ICD-10-CM | POA: Diagnosis not present

## 2019-12-12 DIAGNOSIS — E1143 Type 2 diabetes mellitus with diabetic autonomic (poly)neuropathy: Secondary | ICD-10-CM | POA: Diagnosis not present

## 2019-12-12 DIAGNOSIS — R1011 Right upper quadrant pain: Secondary | ICD-10-CM | POA: Diagnosis not present

## 2019-12-12 DIAGNOSIS — K529 Noninfective gastroenteritis and colitis, unspecified: Secondary | ICD-10-CM | POA: Diagnosis present

## 2019-12-12 DIAGNOSIS — I700 Atherosclerosis of aorta: Secondary | ICD-10-CM | POA: Diagnosis not present

## 2019-12-12 DIAGNOSIS — Z7189 Other specified counseling: Secondary | ICD-10-CM | POA: Diagnosis not present

## 2019-12-12 DIAGNOSIS — K7469 Other cirrhosis of liver: Secondary | ICD-10-CM | POA: Diagnosis not present

## 2019-12-12 DIAGNOSIS — D696 Thrombocytopenia, unspecified: Secondary | ICD-10-CM | POA: Diagnosis not present

## 2019-12-12 DIAGNOSIS — F0150 Vascular dementia without behavioral disturbance: Secondary | ICD-10-CM | POA: Diagnosis not present

## 2019-12-12 DIAGNOSIS — E870 Hyperosmolality and hypernatremia: Secondary | ICD-10-CM | POA: Diagnosis not present

## 2019-12-12 DIAGNOSIS — R4182 Altered mental status, unspecified: Secondary | ICD-10-CM | POA: Diagnosis not present

## 2019-12-12 DIAGNOSIS — I110 Hypertensive heart disease with heart failure: Secondary | ICD-10-CM | POA: Diagnosis not present

## 2019-12-12 DIAGNOSIS — L97519 Non-pressure chronic ulcer of other part of right foot with unspecified severity: Secondary | ICD-10-CM | POA: Diagnosis not present

## 2019-12-12 DIAGNOSIS — G9340 Encephalopathy, unspecified: Secondary | ICD-10-CM | POA: Diagnosis not present

## 2019-12-12 DIAGNOSIS — N390 Urinary tract infection, site not specified: Secondary | ICD-10-CM | POA: Diagnosis not present

## 2019-12-12 LAB — URINE CULTURE

## 2019-12-12 MED ORDER — CEPHALEXIN 500 MG PO CAPS
500.0000 mg | ORAL_CAPSULE | Freq: Two times a day (BID) | ORAL | 0 refills | Status: DC
Start: 2019-12-12 — End: 2020-01-06

## 2019-12-12 NOTE — ED Notes (Signed)
RN went in room to check on pt. Room had odor of wound and rot. RN changed patient. Bed bath given with help of tech. Fresh gown applied. Pt has stage 1 in various areas of bottock. Foam dressing and skin prep applied. Pt had foam dressing on left shin and right knee. Removed dressings. Malodorous sanguinous, thick purulent drainage noted. Dressings appeared very old and to have been on for quite some time. Wound cleaned (picture obtained by PA). 3.5 inches long and 2 inches wide. Skin is red and beefy and no drainage noted at this time after cleaning. No crepitus noted. Odor is improved. Skin around it is thin and appears to have been old skin tear that was dressed but dressing not changed.  Xeroform petroleum gauze applied and secured with tegaderm to shin and R knee. Will need daily changing.

## 2019-12-12 NOTE — ED Notes (Addendum)
Pt back to [facility]; PTAR notified; Report called.

## 2019-12-12 NOTE — ED Notes (Signed)
Pt had episode of vomiting, RN and NT attempted to change gown, pt refused. Will attempt to change later.

## 2019-12-12 NOTE — ED Notes (Signed)
NT changed pt's gown. [place] gave report to [facility]. PTAR here to transport pt to [facility]. AVS, med nec, facesheet, and carelink report given to PTAR. Discharge instructions reviewed.
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
## 2019-12-12 NOTE — ED Notes (Signed)
PT at bedside.
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
## 2019-12-12 NOTE — Progress Notes (Signed)
PT Cancellation Note  Patient Details Name: Tonya Hunter MRN: [id number] DOB: [birth year]   Cancelled Treatment:    Reason Eval/Treat Not Completed: Other (comment) RN and PA-C actively performing wound care, will return later when patient is more available.    Wind[place] Norfolk, DPT, PN1   Supplemental Physical Therapist [facility]    Pager [phone] Acute Rehab Office [phone]

## 2019-12-12 NOTE — ED Notes (Signed)
Patient denies pain and is resting comfortably.  

## 2019-12-12 NOTE — Discharge Instructions (Addendum)
Take 3 more doses of the antibiotic for the urinary tract infection.
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
## 2019-12-12 NOTE — ED Provider Notes (Signed)
Informed by RN that pt has a foul smelling wound. On my evaluation, pt with a skin tear without signs of infection. Will continue wound care and monitoring.          Franchot Heidelberg, PA-C 12/12/19 6010    Maudie Flakes, MD 12/12/19 1535

## 2019-12-13 ENCOUNTER — Other Ambulatory Visit: Payer: Self-pay | Admitting: *Deleted

## 2019-12-13 DIAGNOSIS — K3184 Gastroparesis: Secondary | ICD-10-CM | POA: Diagnosis not present

## 2019-12-13 DIAGNOSIS — R627 Adult failure to thrive: Secondary | ICD-10-CM | POA: Diagnosis not present

## 2019-12-13 DIAGNOSIS — K7460 Unspecified cirrhosis of liver: Secondary | ICD-10-CM | POA: Diagnosis not present

## 2019-12-13 DIAGNOSIS — E039 Hypothyroidism, unspecified: Secondary | ICD-10-CM | POA: Diagnosis not present

## 2019-12-13 NOTE — Patient Outreach (Signed)
[facility]) Care Management  12/13/2019  LAELA DEVINEY [birth year] [phone]   Noted that member was discharged from ED to SNF [facility]) yesterday.  Post acute care coordinator notified, however she does not monitor patients at [facility].  This care manager will follow member for discharge, will follow up with her and/or family pending discharge from SNF.  Valente David, [place], MSN [place] [phone]

## 2019-12-17 DIAGNOSIS — E039 Hypothyroidism, unspecified: Secondary | ICD-10-CM | POA: Diagnosis not present

## 2019-12-17 DIAGNOSIS — E1143 Type 2 diabetes mellitus with diabetic autonomic (poly)neuropathy: Secondary | ICD-10-CM | POA: Diagnosis not present

## 2019-12-17 DIAGNOSIS — K3184 Gastroparesis: Secondary | ICD-10-CM | POA: Diagnosis not present

## 2019-12-17 DIAGNOSIS — K7290 Hepatic failure, unspecified without coma: Secondary | ICD-10-CM | POA: Diagnosis not present

## 2019-12-20 DIAGNOSIS — E440 Moderate protein-calorie malnutrition: Secondary | ICD-10-CM | POA: Diagnosis not present

## 2019-12-20 DIAGNOSIS — E1143 Type 2 diabetes mellitus with diabetic autonomic (poly)neuropathy: Secondary | ICD-10-CM | POA: Diagnosis not present

## 2019-12-20 DIAGNOSIS — K7581 Nonalcoholic steatohepatitis (NASH): Secondary | ICD-10-CM | POA: Diagnosis not present

## 2019-12-20 DIAGNOSIS — K3184 Gastroparesis: Secondary | ICD-10-CM | POA: Diagnosis not present

## 2019-12-20 DIAGNOSIS — K7460 Unspecified cirrhosis of liver: Secondary | ICD-10-CM | POA: Diagnosis not present

## 2019-12-20 DIAGNOSIS — K7290 Hepatic failure, unspecified without coma: Secondary | ICD-10-CM | POA: Diagnosis not present

## 2019-12-31 ENCOUNTER — Other Ambulatory Visit: Payer: Self-pay | Admitting: Physician Assistant

## 2019-12-31 DIAGNOSIS — I82409 Acute embolism and thrombosis of unspecified deep veins of unspecified lower extremity: Secondary | ICD-10-CM

## 2019-12-31 DIAGNOSIS — R188 Other ascites: Secondary | ICD-10-CM | POA: Diagnosis not present

## 2019-12-31 DIAGNOSIS — E876 Hypokalemia: Secondary | ICD-10-CM | POA: Diagnosis not present

## 2019-12-31 DIAGNOSIS — K7460 Unspecified cirrhosis of liver: Secondary | ICD-10-CM | POA: Diagnosis not present

## 2019-12-31 DIAGNOSIS — K7290 Hepatic failure, unspecified without coma: Secondary | ICD-10-CM | POA: Diagnosis not present

## 2020-01-01 DIAGNOSIS — R627 Adult failure to thrive: Secondary | ICD-10-CM | POA: Diagnosis not present

## 2020-01-01 DIAGNOSIS — K7469 Other cirrhosis of liver: Secondary | ICD-10-CM | POA: Diagnosis not present

## 2020-01-01 DIAGNOSIS — K7290 Hepatic failure, unspecified without coma: Secondary | ICD-10-CM | POA: Diagnosis not present

## 2020-01-01 DIAGNOSIS — R188 Other ascites: Secondary | ICD-10-CM | POA: Diagnosis not present

## 2020-01-05 LAB — CUP PACEART REMOTE DEVICE CHECK
Date Time Interrogation Session: 20210819001012
Implantable Pulse Generator Implant Date: 20200615

## 2020-01-06 ENCOUNTER — Emergency Department (HOSPITAL_COMMUNITY): Payer: Medicare Other

## 2020-01-06 ENCOUNTER — Encounter (HOSPITAL_COMMUNITY): Payer: Self-pay | Admitting: Family Medicine

## 2020-01-06 ENCOUNTER — Other Ambulatory Visit: Payer: Self-pay | Admitting: Internal Medicine

## 2020-01-06 ENCOUNTER — Other Ambulatory Visit: Payer: Self-pay

## 2020-01-06 ENCOUNTER — Inpatient Hospital Stay (HOSPITAL_COMMUNITY)
Admission: EM | Admit: 2020-01-06 | Discharge: 2020-01-15 | DRG: 643 | Disposition: E | Payer: Medicare Other | Attending: Internal Medicine | Admitting: Internal Medicine

## 2020-01-06 ENCOUNTER — Ambulatory Visit (INDEPENDENT_AMBULATORY_CARE_PROVIDER_SITE_OTHER): Payer: Medicare Other | Admitting: *Deleted

## 2020-01-06 DIAGNOSIS — R109 Unspecified abdominal pain: Secondary | ICD-10-CM | POA: Diagnosis not present

## 2020-01-06 DIAGNOSIS — J690 Pneumonitis due to inhalation of food and vomit: Secondary | ICD-10-CM | POA: Diagnosis not present

## 2020-01-06 DIAGNOSIS — Z515 Encounter for palliative care: Secondary | ICD-10-CM

## 2020-01-06 DIAGNOSIS — N179 Acute kidney failure, unspecified: Secondary | ICD-10-CM | POA: Diagnosis present

## 2020-01-06 DIAGNOSIS — K529 Noninfective gastroenteritis and colitis, unspecified: Secondary | ICD-10-CM | POA: Diagnosis present

## 2020-01-06 DIAGNOSIS — G9341 Metabolic encephalopathy: Secondary | ICD-10-CM | POA: Diagnosis not present

## 2020-01-06 DIAGNOSIS — K5730 Diverticulosis of large intestine without perforation or abscess without bleeding: Secondary | ICD-10-CM | POA: Diagnosis not present

## 2020-01-06 DIAGNOSIS — E785 Hyperlipidemia, unspecified: Secondary | ICD-10-CM | POA: Diagnosis present

## 2020-01-06 DIAGNOSIS — Z880 Allergy status to penicillin: Secondary | ICD-10-CM

## 2020-01-06 DIAGNOSIS — Z96653 Presence of artificial knee joint, bilateral: Secondary | ICD-10-CM | POA: Diagnosis present

## 2020-01-06 DIAGNOSIS — K7460 Unspecified cirrhosis of liver: Secondary | ICD-10-CM | POA: Diagnosis present

## 2020-01-06 DIAGNOSIS — Z79890 Hormone replacement therapy (postmenopausal): Secondary | ICD-10-CM

## 2020-01-06 DIAGNOSIS — E43 Unspecified severe protein-calorie malnutrition: Secondary | ICD-10-CM | POA: Diagnosis not present

## 2020-01-06 DIAGNOSIS — E871 Hypo-osmolality and hyponatremia: Secondary | ICD-10-CM

## 2020-01-06 DIAGNOSIS — Z79899 Other long term (current) drug therapy: Secondary | ICD-10-CM

## 2020-01-06 DIAGNOSIS — D6959 Other secondary thrombocytopenia: Secondary | ICD-10-CM | POA: Diagnosis present

## 2020-01-06 DIAGNOSIS — Z794 Long term (current) use of insulin: Secondary | ICD-10-CM

## 2020-01-06 DIAGNOSIS — D689 Coagulation defect, unspecified: Secondary | ICD-10-CM | POA: Diagnosis present

## 2020-01-06 DIAGNOSIS — M7989 Other specified soft tissue disorders: Secondary | ICD-10-CM | POA: Diagnosis present

## 2020-01-06 DIAGNOSIS — I509 Heart failure, unspecified: Secondary | ICD-10-CM | POA: Diagnosis present

## 2020-01-06 DIAGNOSIS — I252 Old myocardial infarction: Secondary | ICD-10-CM

## 2020-01-06 DIAGNOSIS — E872 Acidosis: Secondary | ICD-10-CM | POA: Diagnosis present

## 2020-01-06 DIAGNOSIS — D696 Thrombocytopenia, unspecified: Secondary | ICD-10-CM | POA: Diagnosis present

## 2020-01-06 DIAGNOSIS — E222 Syndrome of inappropriate secretion of antidiuretic hormone: Principal | ICD-10-CM | POA: Diagnosis present

## 2020-01-06 DIAGNOSIS — Z85828 Personal history of other malignant neoplasm of skin: Secondary | ICD-10-CM

## 2020-01-06 DIAGNOSIS — Z66 Do not resuscitate: Secondary | ICD-10-CM | POA: Diagnosis not present

## 2020-01-06 DIAGNOSIS — K7581 Nonalcoholic steatohepatitis (NASH): Secondary | ICD-10-CM | POA: Diagnosis present

## 2020-01-06 DIAGNOSIS — F0390 Unspecified dementia without behavioral disturbance: Secondary | ICD-10-CM | POA: Diagnosis present

## 2020-01-06 DIAGNOSIS — K766 Portal hypertension: Secondary | ICD-10-CM | POA: Diagnosis present

## 2020-01-06 DIAGNOSIS — K7200 Acute and subacute hepatic failure without coma: Secondary | ICD-10-CM | POA: Diagnosis present

## 2020-01-06 DIAGNOSIS — Z90710 Acquired absence of both cervix and uterus: Secondary | ICD-10-CM

## 2020-01-06 DIAGNOSIS — N184 Chronic kidney disease, stage 4 (severe): Secondary | ICD-10-CM | POA: Diagnosis present

## 2020-01-06 DIAGNOSIS — Z20822 Contact with and (suspected) exposure to covid-19: Secondary | ICD-10-CM | POA: Diagnosis present

## 2020-01-06 DIAGNOSIS — I130 Hypertensive heart and chronic kidney disease with heart failure and stage 1 through stage 4 chronic kidney disease, or unspecified chronic kidney disease: Secondary | ICD-10-CM | POA: Diagnosis present

## 2020-01-06 DIAGNOSIS — R140 Abdominal distension (gaseous): Secondary | ICD-10-CM

## 2020-01-06 DIAGNOSIS — I350 Nonrheumatic aortic (valve) stenosis: Secondary | ICD-10-CM | POA: Diagnosis present

## 2020-01-06 DIAGNOSIS — K767 Hepatorenal syndrome: Secondary | ICD-10-CM | POA: Diagnosis present

## 2020-01-06 DIAGNOSIS — E870 Hyperosmolality and hypernatremia: Secondary | ICD-10-CM | POA: Diagnosis not present

## 2020-01-06 DIAGNOSIS — E039 Hypothyroidism, unspecified: Secondary | ICD-10-CM | POA: Diagnosis present

## 2020-01-06 DIAGNOSIS — R208 Other disturbances of skin sensation: Secondary | ICD-10-CM | POA: Diagnosis present

## 2020-01-06 DIAGNOSIS — E113593 Type 2 diabetes mellitus with proliferative diabetic retinopathy without macular edema, bilateral: Secondary | ICD-10-CM | POA: Diagnosis not present

## 2020-01-06 DIAGNOSIS — Z955 Presence of coronary angioplasty implant and graft: Secondary | ICD-10-CM

## 2020-01-06 DIAGNOSIS — M21372 Foot drop, left foot: Secondary | ICD-10-CM | POA: Diagnosis present

## 2020-01-06 DIAGNOSIS — E119 Type 2 diabetes mellitus without complications: Secondary | ICD-10-CM

## 2020-01-06 DIAGNOSIS — Z885 Allergy status to narcotic agent status: Secondary | ICD-10-CM

## 2020-01-06 DIAGNOSIS — I639 Cerebral infarction, unspecified: Secondary | ICD-10-CM | POA: Diagnosis not present

## 2020-01-06 DIAGNOSIS — Z7189 Other specified counseling: Secondary | ICD-10-CM

## 2020-01-06 DIAGNOSIS — H3530 Unspecified macular degeneration: Secondary | ICD-10-CM | POA: Diagnosis present

## 2020-01-06 DIAGNOSIS — I959 Hypotension, unspecified: Secondary | ICD-10-CM | POA: Diagnosis present

## 2020-01-06 DIAGNOSIS — N189 Chronic kidney disease, unspecified: Secondary | ICD-10-CM

## 2020-01-06 DIAGNOSIS — Z7901 Long term (current) use of anticoagulants: Secondary | ICD-10-CM

## 2020-01-06 DIAGNOSIS — Z86718 Personal history of other venous thrombosis and embolism: Secondary | ICD-10-CM

## 2020-01-06 DIAGNOSIS — I700 Atherosclerosis of aorta: Secondary | ICD-10-CM | POA: Diagnosis not present

## 2020-01-06 DIAGNOSIS — E1122 Type 2 diabetes mellitus with diabetic chronic kidney disease: Secondary | ICD-10-CM | POA: Diagnosis present

## 2020-01-06 DIAGNOSIS — I2510 Atherosclerotic heart disease of native coronary artery without angina pectoris: Secondary | ICD-10-CM | POA: Diagnosis present

## 2020-01-06 DIAGNOSIS — Z8249 Family history of ischemic heart disease and other diseases of the circulatory system: Secondary | ICD-10-CM

## 2020-01-06 DIAGNOSIS — K8689 Other specified diseases of pancreas: Secondary | ICD-10-CM | POA: Diagnosis not present

## 2020-01-06 DIAGNOSIS — I864 Gastric varices: Secondary | ICD-10-CM | POA: Diagnosis present

## 2020-01-06 DIAGNOSIS — Z809 Family history of malignant neoplasm, unspecified: Secondary | ICD-10-CM

## 2020-01-06 DIAGNOSIS — R188 Other ascites: Secondary | ICD-10-CM | POA: Diagnosis present

## 2020-01-06 DIAGNOSIS — R1011 Right upper quadrant pain: Secondary | ICD-10-CM | POA: Diagnosis not present

## 2020-01-06 DIAGNOSIS — Z79891 Long term (current) use of opiate analgesic: Secondary | ICD-10-CM

## 2020-01-06 DIAGNOSIS — Z8673 Personal history of transient ischemic attack (TIA), and cerebral infarction without residual deficits: Secondary | ICD-10-CM

## 2020-01-06 DIAGNOSIS — R531 Weakness: Secondary | ICD-10-CM

## 2020-01-06 HISTORY — DX: Chronic kidney disease, unspecified: N18.9

## 2020-01-06 LAB — AMMONIA: Ammonia: 56 umol/L — ABNORMAL HIGH (ref 9–35)

## 2020-01-06 LAB — CBC WITH DIFFERENTIAL/PLATELET
Abs Immature Granulocytes: 0.02 10*3/uL (ref 0.00–0.07)
Basophils Absolute: 0 10*3/uL (ref 0.0–0.1)
Basophils Relative: 0 %
Eosinophils Absolute: 0 10*3/uL (ref 0.0–0.5)
Eosinophils Relative: 0 %
HCT: 38.5 % (ref 36.0–46.0)
Hemoglobin: 13.4 g/dL (ref 12.0–15.0)
Immature Granulocytes: 0 %
Lymphocytes Relative: 13 %
Lymphs Abs: 0.6 10*3/uL — ABNORMAL LOW (ref 0.7–4.0)
MCH: 32.1 pg (ref 26.0–34.0)
MCHC: 34.8 g/dL (ref 30.0–36.0)
MCV: 92.3 fL (ref 80.0–100.0)
Monocytes Absolute: 0.3 10*3/uL (ref 0.1–1.0)
Monocytes Relative: 7 %
Neutro Abs: 3.7 10*3/uL (ref 1.7–7.7)
Neutrophils Relative %: 80 %
Platelets: 132 10*3/uL — ABNORMAL LOW (ref 150–400)
RBC: 4.17 MIL/uL (ref 3.87–5.11)
RDW: 17 % — ABNORMAL HIGH (ref 11.5–15.5)
WBC: 4.7 10*3/uL (ref 4.0–10.5)
nRBC: 0 % (ref 0.0–0.2)

## 2020-01-06 LAB — ALBUMIN, PLEURAL OR PERITONEAL FLUID: Albumin, Fluid: <1 g/dL

## 2020-01-06 LAB — BODY FLUID CELL COUNT WITH DIFFERENTIAL
Eos, Fluid: 0 %
Lymphs, Fluid: 63 %
Monocyte-Macrophage-Serous Fluid: 20 % — ABNORMAL LOW (ref 50–90)
Neutrophil Count, Fluid: 17 % (ref 0–25)
Total Nucleated Cell Count, Fluid: 43 cu mm (ref 0–1000)

## 2020-01-06 LAB — GLUCOSE, PLEURAL OR PERITONEAL FLUID: Glucose, Fluid: 180 mg/dL

## 2020-01-06 LAB — COMPREHENSIVE METABOLIC PANEL
ALT: 51 U/L — ABNORMAL HIGH (ref 0–44)
AST: 61 U/L — ABNORMAL HIGH (ref 15–41)
Albumin: 2.8 g/dL — ABNORMAL LOW (ref 3.5–5.0)
Alkaline Phosphatase: 502 U/L — ABNORMAL HIGH (ref 38–126)
Anion gap: 14 (ref 5–15)
BUN: 39 mg/dL — ABNORMAL HIGH (ref 8–23)
CO2: 22 mmol/L (ref 22–32)
Calcium: 9 mg/dL (ref 8.9–10.3)
Chloride: 89 mmol/L — ABNORMAL LOW (ref 98–111)
Creatinine, Ser: 2.79 mg/dL — ABNORMAL HIGH (ref 0.44–1.00)
GFR calc Af Amer: 17 mL/min — ABNORMAL LOW (ref >60)
GFR calc non Af Amer: 15 mL/min — ABNORMAL LOW (ref >60)
Glucose, Bld: 178 mg/dL — ABNORMAL HIGH (ref 70–99)
Potassium: 4 mmol/L (ref 3.5–5.1)
Sodium: 125 mmol/L — ABNORMAL LOW (ref 135–145)
Total Bilirubin: 2.7 mg/dL — ABNORMAL HIGH (ref 0.3–1.2)
Total Protein: 6.8 g/dL (ref 6.5–8.1)

## 2020-01-06 LAB — GRAM STAIN

## 2020-01-06 LAB — PROTEIN, PLEURAL OR PERITONEAL FLUID: Total protein, fluid: <3 g/dL

## 2020-01-06 LAB — LACTATE DEHYDROGENASE, PLEURAL OR PERITONEAL FLUID: LD, Fluid: 28 U/L — ABNORMAL HIGH (ref 3–23)

## 2020-01-06 LAB — LIPASE, BLOOD: Lipase: 48 U/L (ref 11–51)

## 2020-01-06 LAB — SARS CORONAVIRUS 2 BY RT PCR (HOSPITAL ORDER, PERFORMED IN ~~LOC~~ HOSPITAL LAB): SARS Coronavirus 2: NEGATIVE

## 2020-01-06 LAB — CBG MONITORING, ED: Glucose-Capillary: 157 mg/dL — ABNORMAL HIGH (ref 70–99)

## 2020-01-06 LAB — LACTIC ACID, PLASMA: Lactic Acid, Venous: 2.8 mmol/L (ref 0.5–1.9)

## 2020-01-06 MED ORDER — INSULIN GLARGINE 100 UNIT/ML ~~LOC~~ SOLN
12.0000 [IU] | Freq: Every day | SUBCUTANEOUS | Status: DC
  Filled 2020-01-06: qty 0.12

## 2020-01-06 MED ORDER — LACTULOSE 10 GM/15ML PO SOLN
40.0000 g | Freq: Three times a day (TID) | ORAL | Status: DC
  Administered 2020-01-06 – 2020-01-09 (×8): 40 g via ORAL
  Filled 2020-01-06 (×8): qty 60

## 2020-01-06 MED ORDER — OXYCODONE HCL ER 10 MG PO T12A: 10.0000 mg | EXTENDED_RELEASE_TABLET | Freq: Two times a day (BID) | ORAL | Status: DC | PRN

## 2020-01-06 MED ORDER — INSULIN ASPART 100 UNIT/ML ~~LOC~~ SOLN
0.0000 [IU] | Freq: Three times a day (TID) | SUBCUTANEOUS | Status: DC
  Administered 2020-01-07: 1 [IU] via SUBCUTANEOUS
  Administered 2020-01-07: 3 [IU] via SUBCUTANEOUS
  Administered 2020-01-08: 2 [IU] via SUBCUTANEOUS
  Administered 2020-01-08: 3 [IU] via SUBCUTANEOUS
  Administered 2020-01-09 (×3): 1 [IU] via SUBCUTANEOUS

## 2020-01-06 MED ORDER — APIXABAN 5 MG PO TABS
5.0000 mg | ORAL_TABLET | Freq: Two times a day (BID) | ORAL | Status: DC
  Administered 2020-01-06 – 2020-01-10 (×7): 5 mg via ORAL
  Filled 2020-01-06 (×9): qty 1

## 2020-01-06 MED ORDER — ONDANSETRON HCL 4 MG PO TABS: 4.0000 mg | ORAL_TABLET | Freq: Four times a day (QID) | ORAL | Status: DC | PRN

## 2020-01-06 MED ORDER — POLYETHYLENE GLYCOL 3350 17 G PO PACK: 17.0000 g | PACK | Freq: Every day | ORAL | Status: DC | PRN

## 2020-01-06 MED ORDER — SODIUM CHLORIDE 0.9 % IV SOLN: INTRAVENOUS | Status: DC

## 2020-01-06 MED ORDER — ONDANSETRON HCL 4 MG/2ML IJ SOLN
4.0000 mg | Freq: Four times a day (QID) | INTRAMUSCULAR | Status: DC | PRN
  Administered 2020-01-08 – 2020-01-12 (×2): 4 mg via INTRAVENOUS
  Filled 2020-01-06 (×3): qty 2

## 2020-01-06 MED ORDER — LIDOCAINE-EPINEPHRINE 1 %-1:100000 IJ SOLN
10.0000 mL | Freq: Once | INTRAMUSCULAR | Status: AC
  Administered 2020-01-06: 10 mL
  Filled 2020-01-06: qty 1

## 2020-01-06 MED ORDER — SPIRONOLACTONE 25 MG PO TABS
50.0000 mg | ORAL_TABLET | Freq: Two times a day (BID) | ORAL | Status: DC
  Administered 2020-01-06 – 2020-01-07 (×3): 50 mg via ORAL
  Filled 2020-01-06 (×3): qty 2

## 2020-01-06 MED ORDER — INSULIN GLARGINE 100 UNIT/ML ~~LOC~~ SOLN
6.0000 [IU] | Freq: Every day | SUBCUTANEOUS | Status: DC
  Administered 2020-01-07 – 2020-01-10 (×4): 6 [IU] via SUBCUTANEOUS
  Filled 2020-01-06 (×5): qty 0.06

## 2020-01-06 MED ORDER — ADULT MULTIVITAMIN W/MINERALS CH
1.0000 | ORAL_TABLET | Freq: Every day | ORAL | Status: DC
  Administered 2020-01-07 – 2020-01-10 (×4): 1 via ORAL
  Filled 2020-01-06 (×4): qty 1

## 2020-01-06 MED ORDER — PANTOPRAZOLE SODIUM 40 MG PO TBEC
40.0000 mg | DELAYED_RELEASE_TABLET | Freq: Two times a day (BID) | ORAL | Status: DC
  Administered 2020-01-06 – 2020-01-10 (×7): 40 mg via ORAL
  Filled 2020-01-06 (×9): qty 1

## 2020-01-06 MED ORDER — TORSEMIDE 20 MG PO TABS
20.0000 mg | ORAL_TABLET | Freq: Every day | ORAL | Status: DC
  Administered 2020-01-06: 20 mg via ORAL
  Filled 2020-01-06: qty 1

## 2020-01-06 MED ORDER — INSULIN ASPART 100 UNIT/ML ~~LOC~~ SOLN
0.0000 [IU] | Freq: Every day | SUBCUTANEOUS | Status: DC
  Administered 2020-01-11: 2 [IU] via SUBCUTANEOUS

## 2020-01-06 MED ORDER — LEVOTHYROXINE SODIUM 25 MCG PO TABS
137.0000 ug | ORAL_TABLET | Freq: Every day | ORAL | Status: DC
  Administered 2020-01-07 – 2020-01-09 (×3): 137 ug via ORAL
  Filled 2020-01-06 (×4): qty 1

## 2020-01-06 NOTE — Progress Notes (Signed)
Carelink Summary Report / Loop Recorder 

## 2020-01-06 NOTE — ED Provider Notes (Signed)
Patient with multiple medical problems including Tonya Hunter with worsening ascites who presented from home today due to abdominal pain this morning, poor oral intake and was found to have worsening AKI, worsening hyponatremia. Paracentesis was done but so far fluid has been reassuring. Cell count was not initially ordered but has been added on. Normal white count and low suspicion that patient has SBP. However with the hyponatremia and worsening renal function feel that patient needs medical management. Patient also may benefit from palliative care consult.   [name], MD 12/19/2019 [phone]
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
## 2020-01-06 NOTE — ED Notes (Signed)
Pt transferred to hospital bed. Brief changed. No suction available in room unable to reconnect pt to [place]. No brief, new sheets, and pt cleaned.
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
## 2020-01-06 NOTE — ED Notes (Signed)
Pt coming from [facility].

## 2020-01-06 NOTE — ED Triage Notes (Signed)
Pt bib [facility] with reports of abdominal pain and distention with fluid build up.   History of stage 4 liver cancer and dementia  104/44 BP 83 HR 98% O2  DNR at bedside

## 2020-01-07 DIAGNOSIS — N184 Chronic kidney disease, stage 4 (severe): Secondary | ICD-10-CM | POA: Diagnosis present

## 2020-01-07 DIAGNOSIS — R531 Weakness: Secondary | ICD-10-CM

## 2020-01-07 DIAGNOSIS — E43 Unspecified severe protein-calorie malnutrition: Secondary | ICD-10-CM | POA: Diagnosis present

## 2020-01-07 DIAGNOSIS — I350 Nonrheumatic aortic (valve) stenosis: Secondary | ICD-10-CM | POA: Diagnosis present

## 2020-01-07 DIAGNOSIS — E871 Hypo-osmolality and hyponatremia: Secondary | ICD-10-CM | POA: Diagnosis not present

## 2020-01-07 DIAGNOSIS — E872 Acidosis: Secondary | ICD-10-CM | POA: Diagnosis present

## 2020-01-07 DIAGNOSIS — N179 Acute kidney failure, unspecified: Secondary | ICD-10-CM

## 2020-01-07 DIAGNOSIS — R109 Unspecified abdominal pain: Secondary | ICD-10-CM

## 2020-01-07 DIAGNOSIS — Z515 Encounter for palliative care: Secondary | ICD-10-CM | POA: Diagnosis not present

## 2020-01-07 DIAGNOSIS — E119 Type 2 diabetes mellitus without complications: Secondary | ICD-10-CM

## 2020-01-07 DIAGNOSIS — K7469 Other cirrhosis of liver: Secondary | ICD-10-CM | POA: Diagnosis not present

## 2020-01-07 DIAGNOSIS — G9341 Metabolic encephalopathy: Secondary | ICD-10-CM

## 2020-01-07 DIAGNOSIS — Z7189 Other specified counseling: Secondary | ICD-10-CM | POA: Diagnosis not present

## 2020-01-07 DIAGNOSIS — K7460 Unspecified cirrhosis of liver: Secondary | ICD-10-CM | POA: Diagnosis present

## 2020-01-07 DIAGNOSIS — R188 Other ascites: Secondary | ICD-10-CM | POA: Diagnosis present

## 2020-01-07 DIAGNOSIS — E113593 Type 2 diabetes mellitus with proliferative diabetic retinopathy without macular edema, bilateral: Secondary | ICD-10-CM | POA: Diagnosis not present

## 2020-01-07 DIAGNOSIS — I129 Hypertensive chronic kidney disease with stage 1 through stage 4 chronic kidney disease, or unspecified chronic kidney disease: Secondary | ICD-10-CM | POA: Diagnosis not present

## 2020-01-07 DIAGNOSIS — F0390 Unspecified dementia without behavioral disturbance: Secondary | ICD-10-CM | POA: Diagnosis present

## 2020-01-07 DIAGNOSIS — Z66 Do not resuscitate: Secondary | ICD-10-CM | POA: Diagnosis present

## 2020-01-07 DIAGNOSIS — D696 Thrombocytopenia, unspecified: Secondary | ICD-10-CM | POA: Diagnosis not present

## 2020-01-07 DIAGNOSIS — R140 Abdominal distension (gaseous): Secondary | ICD-10-CM | POA: Diagnosis not present

## 2020-01-07 DIAGNOSIS — I130 Hypertensive heart and chronic kidney disease with heart failure and stage 1 through stage 4 chronic kidney disease, or unspecified chronic kidney disease: Secondary | ICD-10-CM | POA: Diagnosis present

## 2020-01-07 DIAGNOSIS — E222 Syndrome of inappropriate secretion of antidiuretic hormone: Secondary | ICD-10-CM | POA: Diagnosis present

## 2020-01-07 DIAGNOSIS — E1129 Type 2 diabetes mellitus with other diabetic kidney complication: Secondary | ICD-10-CM | POA: Diagnosis not present

## 2020-01-07 DIAGNOSIS — K7581 Nonalcoholic steatohepatitis (NASH): Secondary | ICD-10-CM | POA: Diagnosis not present

## 2020-01-07 DIAGNOSIS — R1110 Vomiting, unspecified: Secondary | ICD-10-CM | POA: Diagnosis not present

## 2020-01-07 DIAGNOSIS — J690 Pneumonitis due to inhalation of food and vomit: Secondary | ICD-10-CM | POA: Diagnosis not present

## 2020-01-07 DIAGNOSIS — K766 Portal hypertension: Secondary | ICD-10-CM | POA: Diagnosis present

## 2020-01-07 DIAGNOSIS — K7200 Acute and subacute hepatic failure without coma: Secondary | ICD-10-CM | POA: Diagnosis present

## 2020-01-07 DIAGNOSIS — Z86718 Personal history of other venous thrombosis and embolism: Secondary | ICD-10-CM | POA: Diagnosis not present

## 2020-01-07 DIAGNOSIS — D689 Coagulation defect, unspecified: Secondary | ICD-10-CM | POA: Diagnosis present

## 2020-01-07 DIAGNOSIS — Z20822 Contact with and (suspected) exposure to covid-19: Secondary | ICD-10-CM | POA: Diagnosis present

## 2020-01-07 DIAGNOSIS — E1122 Type 2 diabetes mellitus with diabetic chronic kidney disease: Secondary | ICD-10-CM | POA: Diagnosis present

## 2020-01-07 DIAGNOSIS — K529 Noninfective gastroenteritis and colitis, unspecified: Secondary | ICD-10-CM | POA: Diagnosis present

## 2020-01-07 DIAGNOSIS — I2510 Atherosclerotic heart disease of native coronary artery without angina pectoris: Secondary | ICD-10-CM | POA: Diagnosis present

## 2020-01-07 DIAGNOSIS — Z794 Long term (current) use of insulin: Secondary | ICD-10-CM

## 2020-01-07 DIAGNOSIS — K767 Hepatorenal syndrome: Secondary | ICD-10-CM | POA: Diagnosis present

## 2020-01-07 DIAGNOSIS — F0150 Vascular dementia without behavioral disturbance: Secondary | ICD-10-CM | POA: Diagnosis not present

## 2020-01-07 DIAGNOSIS — I509 Heart failure, unspecified: Secondary | ICD-10-CM | POA: Diagnosis present

## 2020-01-07 LAB — CBC WITH DIFFERENTIAL/PLATELET
Abs Immature Granulocytes: 0.02 10*3/uL (ref 0.00–0.07)
Basophils Absolute: 0 10*3/uL (ref 0.0–0.1)
Basophils Relative: 1 %
Eosinophils Absolute: 0 10*3/uL (ref 0.0–0.5)
Eosinophils Relative: 1 %
HCT: 30.3 % — ABNORMAL LOW (ref 36.0–46.0)
Hemoglobin: 10.8 g/dL — ABNORMAL LOW (ref 12.0–15.0)
Immature Granulocytes: 1 %
Lymphocytes Relative: 24 %
Lymphs Abs: 0.9 10*3/uL (ref 0.7–4.0)
MCH: 32 pg (ref 26.0–34.0)
MCHC: 35.6 g/dL (ref 30.0–36.0)
MCV: 89.9 fL (ref 80.0–100.0)
Monocytes Absolute: 0.4 10*3/uL (ref 0.1–1.0)
Monocytes Relative: 11 %
Neutro Abs: 2.4 10*3/uL (ref 1.7–7.7)
Neutrophils Relative %: 62 %
Platelets: 116 10*3/uL — ABNORMAL LOW (ref 150–400)
RBC: 3.37 MIL/uL — ABNORMAL LOW (ref 3.87–5.11)
RDW: 16.6 % — ABNORMAL HIGH (ref 11.5–15.5)
WBC: 3.8 10*3/uL — ABNORMAL LOW (ref 4.0–10.5)
nRBC: 0 % (ref 0.0–0.2)

## 2020-01-07 LAB — GLUCOSE, CAPILLARY
Glucose-Capillary: 137 mg/dL — ABNORMAL HIGH (ref 70–99)
Glucose-Capillary: 148 mg/dL — ABNORMAL HIGH (ref 70–99)
Glucose-Capillary: 208 mg/dL — ABNORMAL HIGH (ref 70–99)
Glucose-Capillary: 127 mg/dL — ABNORMAL HIGH (ref 70–99)
Glucose-Capillary: 115 mg/dL — ABNORMAL HIGH (ref 70–99)

## 2020-01-07 LAB — COMPREHENSIVE METABOLIC PANEL
ALT: 45 U/L — ABNORMAL HIGH (ref 0–44)
AST: 51 U/L — ABNORMAL HIGH (ref 15–41)
Albumin: 2.5 g/dL — ABNORMAL LOW (ref 3.5–5.0)
Alkaline Phosphatase: 394 U/L — ABNORMAL HIGH (ref 38–126)
Anion gap: 14 (ref 5–15)
BUN: 41 mg/dL — ABNORMAL HIGH (ref 8–23)
CO2: 20 mmol/L — ABNORMAL LOW (ref 22–32)
Calcium: 8.7 mg/dL — ABNORMAL LOW (ref 8.9–10.3)
Chloride: 92 mmol/L — ABNORMAL LOW (ref 98–111)
Creatinine, Ser: 2.74 mg/dL — ABNORMAL HIGH (ref 0.44–1.00)
GFR calc Af Amer: 18 mL/min — ABNORMAL LOW (ref >60)
GFR calc non Af Amer: 15 mL/min — ABNORMAL LOW (ref >60)
Glucose, Bld: 193 mg/dL — ABNORMAL HIGH (ref 70–99)
Potassium: 3.5 mmol/L (ref 3.5–5.1)
Sodium: 126 mmol/L — ABNORMAL LOW (ref 135–145)
Total Bilirubin: 2.4 mg/dL — ABNORMAL HIGH (ref 0.3–1.2)
Total Protein: 5.9 g/dL — ABNORMAL LOW (ref 6.5–8.1)

## 2020-01-07 LAB — LACTIC ACID, PLASMA
Lactic Acid, Venous: 1.7 mmol/L (ref 0.5–1.9)
Lactic Acid, Venous: 2 mmol/L (ref 0.5–1.9)

## 2020-01-07 LAB — MAGNESIUM: Magnesium: 1.3 mg/dL — ABNORMAL LOW (ref 1.7–2.4)

## 2020-01-07 LAB — PHOSPHORUS: Phosphorus: 3.2 mg/dL (ref 2.5–4.6)

## 2020-01-07 LAB — MRSA PCR SCREENING: MRSA by PCR: NEGATIVE

## 2020-01-07 LAB — PATHOLOGIST SMEAR REVIEW

## 2020-01-07 LAB — PROCALCITONIN: Procalcitonin: 0.12 ng/mL

## 2020-01-07 MED ORDER — MAGNESIUM SULFATE 50 % IJ SOLN
3.0000 g | Freq: Once | INTRAVENOUS | Status: AC
  Administered 2020-01-07: 3 g via INTRAVENOUS
  Filled 2020-01-07: qty 6

## 2020-01-07 MED ORDER — ALBUMIN HUMAN 25 % IV SOLN
25.0000 g | Freq: Once | INTRAVENOUS | Status: AC
  Administered 2020-01-07: 25 g via INTRAVENOUS
  Filled 2020-01-07: qty 100

## 2020-01-07 NOTE — Evaluation (Signed)
Physical Therapy Evaluation Patient Details Name: Tonya Hunter MRN: [id number] DOB: 1935-03-12 Today's Date: 01/07/2020   History of Present Illness  85 yo female was admitted from SNF with hyponatremia, with CKD and ascites from NASH, had paracentesis on 8/23 in ED with no infection found.  Has abd pain and nausea. Family assisting with her care now, planning to continue with rehab placement.  PMHx:  RBBB, gastroentritis, aortic stenosis, CTS, CHF, hx colles fracture, HLD/HTN, L foot drop, macular degeneration, MI, post concussive syndrome, CVA, DM, L humeral fracture, IVC filer, cardiac cath, median nerve repair, hx femur fracture, B TKA  Clinical Impression  Pt is seen for initiation of mobility, and while initial levels of tolerance are limited, she has recently been up walking per pt and daughter.  Pt is motivated to get home, and will possibly be having assistance from more family.  Has been home with her husband, and will anticipate that she can expect to get assistance.  Has come from SNF but is motivated to try the higher intensity of CIR.    Follow Up Recommendations CIR    Equipment Recommendations  None recommended by PT    Recommendations for Other Services Rehab consult     Precautions / Restrictions Precautions Precautions: Fall Precaution Comments: skin is fragile, easily injured Restrictions Weight Bearing Restrictions: No Other Position/Activity Restrictions: recent paracentesis, monitor      Mobility  Bed Mobility Overal bed mobility: Needs Assistance Bed Mobility: Supine to Sit;Sit to Supine     Supine to sit: Max assist Sit to supine: Max assist   General bed mobility comments: max assist due to weakness and limited ROM to flex knees to get on and off bed, L foot drop  Transfers Overall transfer level: Needs assistance Equipment used: Rolling walker (2 wheeled);1 person hand held assist Transfers: Sit to/from Stand Sit to Stand: Max assist;From  elevated surface (to stand partially)         General transfer comment: had to cue her to work on standing with assist to power up and with walker was impractical due to dense weakness on LE's  Ambulation/Gait             General Gait Details: not able to step  Stairs            Wheelchair Mobility    Modified Rankin (Stroke Patients Only)       Balance Overall balance assessment: Needs assistance Sitting-balance support: Feet supported Sitting balance-Leahy Scale: Fair Sitting balance - Comments: fair once initial standing partially with max assist     Standing balance-Leahy Scale: Zero                               Pertinent Vitals/Pain Pain Assessment: No/denies pain    Home Living Family/patient expects to be discharged to:: Inpatient rehab                 Additional Comments: family is requesting     Prior Function Level of Independence: Needs assistance   Gait / Transfers Assistance Needed: has walked in last few days but requires help with RW, often in bed  ADL's / Homemaking Assistance Needed: had help from husband        Hand Dominance   Dominant Hand: Right    Extremity/Trunk Assessment   Upper Extremity Assessment Upper Extremity Assessment: Generalized weakness    Lower Extremity Assessment Lower Extremity Assessment: Generalized weakness  Cervical / Trunk Assessment Cervical / Trunk Assessment: Kyphotic  Communication   Communication: No difficulties  Cognition Arousal/Alertness: Awake/alert;Lethargic Behavior During Therapy: Flat affect Overall Cognitive Status: Within Functional Limits for tasks assessed                                 General Comments: pt is afraid of moving and having some LE pain, was able to attempt standing with max assist      General Comments General comments (skin integrity, edema, etc.): pt was seen for mobility on side of bed, sitting balance initially poor  then fair with standing effort.  Second standing effort very poor and weak, but then returned to bed with pt preferring to lie on L side    Exercises     Assessment/Plan    PT Assessment Patient needs continued PT services  PT Problem List Decreased strength;Decreased range of motion;Decreased activity tolerance;Decreased mobility;Decreased balance;Decreased coordination;Decreased knowledge of use of DME;Decreased safety awareness;Decreased skin integrity;Pain       PT Treatment Interventions DME instruction;Gait training;Functional mobility training;Therapeutic activities;Therapeutic exercise;Balance training;Neuromuscular re-education;Patient/family education    PT Goals (Current goals can be found in the Care Plan section)  Acute Rehab PT Goals Patient Stated Goal: to go to rehab inside hospital PT Goal Formulation: With patient/family Time For Goal Achievement: 01/21/20 Potential to Achieve Goals: Good    Frequency Min 3X/week   Barriers to discharge Inaccessible home environment;Decreased caregiver support home with husband previously but needs two person assist    Co-evaluation               AM-PAC PT "6 Clicks" Mobility  Outcome Measure Help needed turning from your back to your side while in a flat bed without using bedrails?: A Lot Help needed moving from lying on your back to sitting on the side of a flat bed without using bedrails?: A Lot Help needed moving to and from a bed to a chair (including a wheelchair)?: A Lot Help needed standing up from a chair using your arms (e.g., wheelchair or bedside chair)?: A Lot Help needed to walk in hospital room?: Total Help needed climbing 3-5 steps with a railing? : Total 6 Click Score: 10    End of Session Equipment Utilized During Treatment: Gait belt Activity Tolerance: Patient limited by fatigue;Treatment limited secondary to medical complications (Comment) Patient left: in bed;with call bell/phone within  reach;with bed alarm set;with family/visitor present Nurse Communication: Mobility status PT Visit Diagnosis: Unsteadiness on feet (R26.81);Muscle weakness (generalized) (M62.81);Difficulty in walking, not elsewhere classified (R26.2);Adult, failure to thrive (R62.7)    Time: 8119-1478 PT Time Calculation (min) (ACUTE ONLY): 30 min   Charges:   PT Evaluation $PT Eval Moderate Complexity: 1 Mod PT Treatments $Therapeutic Activity: 8-22 mins       Ramond Dial 01/07/2020, 12:28 PM  Mee Hives, PT MS Acute Rehab Dept. Number: [place] [facility] [place]

## 2020-01-07 NOTE — Progress Notes (Signed)
Rehab Admissions Coordinator Note:  Patient was screened by Cleatrice Burke for appropriateness for an Inpatient Acute Rehab Consult per therapy recs. Patient was at [facility] 09/2019 for 35 days and was discharged to [facility] SNF due to care needs unable to be provided at home.   At this time, we are recommending [place]. Patient lacks the medical neccesity for CIR admit and does not have the caregiver support at home.   Cleatrice Burke RN MSN 01/07/2020, 1:50 PM  I can be reached at [phone].

## 2020-01-07 NOTE — Progress Notes (Signed)
New Admission Note: ? Arrival Method: Bed  Mental Orientation: Alert and Oriented x 2 Telemetry: None  Assessment: Completed Skin: Refer to flowsheet IV: Right Upper Arm  Pain: 0/10  Tubes: None  Safety Measures: Safety Fall Prevention Plan discussed with patient. Admission: Completed 5 [place] Orientation: Patient has been orientated to the room, unit and the staff. Family: None at the bedside  Orders have been reviewed and are being implemented. Will continue to monitor the patient. Call light has been placed within reach and bed alarm has been activated.  ? Milagros Loll, RN  Phone Number: [phone]
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
## 2020-01-07 NOTE — Care Management Obs Status (Signed)
[place] NOTIFICATION   Patient Details  Name: Tonya Hunter MRN: [id number] Date of Birth: [birth year]   Medicare Observation Status Notification Given:  Yes    Bartholomew Crews, RN 01/07/2020, 4:49 PM

## 2020-01-07 NOTE — Progress Notes (Signed)
PROGRESS NOTE    Tonya Hunter  PJK:[id number] DOB: August 05, 1934 DOA: 12/15/2019 PCP: [name], MD     Brief Narrative:  Tonya Hunter is a 85 y.o. WF PMHx Dementia liver cirrhosis, stroke, DM Type II controlled with complication, CAD, CHF, hx of DVT, aortic stenosis, macular degeneration,   Presents with abdominal pain.   Patient is alone.  She is not a good historian.  They said she hadsome mild abdominal pain earlier today and thought she was a little swollen.  She has not had fevers, chills, nausea, vomiting.  She does have chronic diarrhea secondary to her regular medications.  She reports her swelling in her legs and diarrhea been unchanged from her baseline.  She does report that she spends her time in bed and does not get up and ambulate often.  States she has not had any syncope.  She denies any slurred speech, drooping face, numbness of extremities  ED Course:    In the emergency room patient underwent paracentesis which is reviewed and does not show signs of infection.  There is low concern for SBP.  Patient is found to have worsening hyponatremia.  Level a month ago was 138 and today is 125.  Patient has mild decrease in kidney function from a month ago.  Hospital service been asked to manage patient for further work-up and treatment.   Subjective: A/O x1 (does not know where, when, why).  Pleasantly confused follows commands   Assessment & Plan: Covid vaccination;   Principal Problem:   Hyponatremia Active Problems:   Diabetes mellitus type 2, insulin dependent ([place])   DNR (do not resuscitate)   NASH (nonalcoholic steatohepatitis)   Thrombocytopenia ([place])   Cirrhosis of liver with ascites ([place])   History of DVT (deep vein thrombosis)   Acute kidney injury superimposed on CKD (HCC)   Generalized weakness   Acute metabolic encephalopathy   Dementia without behavioral disturbance (HCC)   Acute renal failure superimposed on stage 4 chronic kidney disease ([place])    Severe protein-calorie malnutrition ([name]: less than 60% of standard weight) ([place])  Acute metabolic encephalopathy vs dementia -B12 pending -Anemia panel pending  Hyponatremia -worsening hyponatremia.  A month ago sodium level is 138 and has been steadily declining over the last month.  Today is 125. -Most likely secondary to liver cirrhosis, kidney failure. - Patient had urine sodium and urine osmolality test done May 2021. -Serum osmolality pending -Urine osmolality pending -Urine electrolytes pending   Acute on CKD stage IV (baseline Cr 2.17 last month) -Mild worsening of CKD with creatinine increasing from 2.17 last month to 2.7 today. -Gentle IV fluid hydration overnight.  Normal saline 3m/hr Lab Results  Component Value Date   CREATININE 2.74 (H) 01/07/2020   CREATININE 2.79 (H) 12/29/2019   CREATININE 2.17 (H) 12/11/2019   CREATININE 1.75 (H) 12/05/2019   CREATININE 2.16 (H) 12/04/2019    DM Type II controlled complication -78/09hemoglobin A1c= 6.4 -Lantus 6 units daily -Sensitive SSI  NASH/Cirrhosis of Liver with Ascites NASH (nonalcoholic steatohepatitis) -8/23 paracentesis performed by ED fluid sent for analysis  -Per EMR peritoneal fluid did not appear to be infected so empiric antibiotics not started -Negative white blood cell count, negative fever, negative bands, negative left shift we will continue to hold on starting antibiotics for SBP.  Thrombocytopenia - No overt signs of bleeding though patient has multiple bruises all over her body    Generalized weakness - PT recommended   CIR who declined patient.  Patient will require long-term placement in SNF   Borderline hypotensive -8/24 Albumin 25 g -Goal is to maintain MAP> 60  Hypomagnesmia -Magnesium goal> 2 -Magnesium IV 3 g  Severe protein calorie malnutrition -8/24 nutrition consult; this is obviously an ongoing long-term situation of malnourishment and slow decline of patient's  health     DVT prophylaxis: Eliquis Code Status: DNR Family Communication:  Status is: Inpatient    Dispo: The patient is from: Home              Anticipated d/c is to: SNF              Anticipated d/c date is:??              Patient currently unstable      Consultants:  Palliative care  Procedures/Significant Events:  8/23 Paracentesis 263m aspirated    I have personally reviewed and interpreted all radiology studies and my findings are as above.  VENTILATOR SETTINGS:    Cultures 8/23 peritoneal NGTD    Antimicrobials: Anti-infectives (From admission, onward)   None       Devices    LINES / TUBES:      Continuous Infusions:  sodium chloride 75 mL/hr at 01/07/20 0210     Objective: Vitals:   01/07/20 0554 01/07/20 0815 01/07/20 1238 01/07/20 1656  BP: (!) 98/44 (!) 80/51 98/63 (!) 90/57  Pulse: 91 92 88 77  Resp: 18 16 17 13   Temp: 97.8 F (36.6 C) (!) 97.5 F (36.4 C) (!) 97.5 F (36.4 C) (!) 97.4 F (36.3 C)  TempSrc: Oral Oral Oral Oral  SpO2: 96% 95% 96% 95%  Weight:      Height:        Intake/Output Summary (Last 24 hours) at 01/07/2020 2018 Last data filed at 01/07/2020 1300 Gross per 24 hour  Intake 1108.59 ml  Output 0 ml  Net 1108.59 ml   Filed Weights   01/05/2020 1403 01/07/20 0145  Weight: 63.5 kg 58.3 kg    Examination:  General: A/O x1 (does not know where, when, why) pleasantly confused No acute respiratory distress Eyes: negative scleral hemorrhage, negative anisocoria, negative icterus ENT: Negative Runny nose, negative gingival bleeding, Neck:  Negative scars, masses, torticollis, lymphadenopathy, JVD Lungs: Clear to auscultation bilaterally without wheezes or crackles Cardiovascular: Regular rate and rhythm without murmur gallop or rub normal S1 and S2 Abdomen: negative abdominal pain, nondistended, positive soft, bowel sounds, no rebound, no ascites, no appreciable mass Extremities: No significant  cyanosis, clubbing, or edema bilateral lower extremities Skin: Lacerations and bruises on all extremities patient unable to explain how these occurred Psychiatric: Unable to evaluate secondary to altered mental status Central nervous system:  Cranial nerves II through XII intact, tongue/uvula midline, all extremities muscle strength 5/5, sensation intact throughout,  negative dysarthria, negative expressive aphasia, negative receptive aphasia.  .     Data Reviewed: Care during the described time interval was provided by me .  I have reviewed this patient's available data, including medical history, events of note, physical examination, and all test results as part of my evaluation.  CBC: Recent Labs  Lab 01/10/2020 1459 01/07/20 0833  WBC 4.7 3.8*  NEUTROABS 3.7 2.4  HGB 13.4 10.8*  HCT 38.5 30.3*  MCV 92.3 89.9  PLT 132* 1536   Basic Metabolic Panel: Recent Labs  Lab 12/18/2019 1459 01/07/20 0833  NA 125* 126*  K 4.0 3.5  CL 89* 92*  CO2 22 20*  GLUCOSE 178*  193*  BUN 39* 41*  CREATININE 2.79* 2.74*  CALCIUM 9.0 8.7*  MG  --  1.3*  PHOS  --  3.2   GFR: Estimated Creatinine Clearance: 14.3 mL/min (A) (by C-G formula based on SCr of 2.74 mg/dL (H)). Liver Function Tests: Recent Labs  Lab 12/28/2019 1459 01/07/20 0833  AST 61* 51*  ALT 51* 45*  ALKPHOS 502* 394*  BILITOT 2.7* 2.4*  PROT 6.8 5.9*  ALBUMIN 2.8* 2.5*   Recent Labs  Lab 12/17/2019 1459  LIPASE 48   Recent Labs  Lab 12/28/2019 1459  AMMONIA 56*   Coagulation Profile: No results for input(s): INR, PROTIME in the last 168 hours. Cardiac Enzymes: No results for input(s): CKTOTAL, CKMB, CKMBINDEX, TROPONINI in the last 168 hours. BNP (last 3 results) No results for input(s): PROBNP in the last 8760 hours. HbA1C: No results for input(s): HGBA1C in the last 72 hours. CBG: Recent Labs  Lab 12/17/2019 2154 01/07/20 0242 01/07/20 0642 01/07/20 1134 01/07/20 1656  GLUCAP 157* 137* 148* 208* 127*    Lipid Profile: No results for input(s): CHOL, HDL, LDLCALC, TRIG, CHOLHDL, LDLDIRECT in the last 72 hours. Thyroid Function Tests: No results for input(s): TSH, T4TOTAL, FREET4, T3FREE, THYROIDAB in the last 72 hours. Anemia Panel: No results for input(s): VITAMINB12, FOLATE, FERRITIN, TIBC, IRON, RETICCTPCT in the last 72 hours. Sepsis Labs: Recent Labs  Lab 12/19/2019 1459 01/07/20 0833 01/07/20 1126  PROCALCITON  --  0.12  --   LATICACIDVEN 2.8* 1.7 2.0*    Recent Results (from the past 240 hour(s))  Culture, body fluid-bottle     Status: None (Preliminary result)   Collection Time: 01/03/2020  5:06 PM   Specimen: Fluid  Result Value Ref Range Status   Specimen Description FLUID PERITONEAL  Final   Special Requests   Final    BOTTLES DRAWN AEROBIC AND ANAEROBIC Blood Culture adequate volume   Culture   Final    NO GROWTH < 12 HOURS Performed at [facility], [place] [address] [place], [place] [address]    Report Status PENDING  Incomplete  Gram stain     Status: None   Collection Time: 12/30/2019  5:06 PM   Specimen: Fluid  Result Value Ref Range Status   Specimen Description FLUID PERITONEAL  Final   Special Requests NONE  Final   Gram Stain   Final    RARE WBC PRESENT, PREDOMINANTLY MONONUCLEAR NO ORGANISMS SEEN Performed at [facility], [place] [address] [place], [place] [address]    Report Status 12/29/2019 FINAL  Final  SARS Coronavirus 2 by RT PCR (hospital order, performed in [facility] hospital lab) Nasopharyngeal Nasopharyngeal Swab     Status: None   Collection Time: 12/27/2019  7:13 PM   Specimen: Nasopharyngeal Swab  Result Value Ref Range Status   SARS Coronavirus 2 NEGATIVE NEGATIVE Final    Comment: (NOTE) SARS-CoV-2 target nucleic acids are NOT DETECTED.  The SARS-CoV-2 RNA is generally detectable in upper and lower respiratory specimens during the acute phase of infection. The lowest concentration of SARS-CoV-2 viral copies this assay can  detect is 250 copies / mL. A negative result does not preclude SARS-CoV-2 infection and should not be used as the sole basis for treatment or other patient management decisions.  A negative result may occur with improper specimen collection / handling, submission of specimen other than nasopharyngeal swab, presence of viral mutation(s) within the areas targeted by this assay, and inadequate number of viral  copies (<250 copies / mL). A negative result must be combined with clinical observations, patient history, and epidemiological information.  Fact Sheet for Patients:   StrictlyIdeas.no  Fact Sheet for Healthcare Providers: BankingDealers.co.za  This test is not yet approved or  cleared by the [place] FDA and has been authorized for detection and/or diagnosis of SARS-CoV-2 by FDA under an Emergency Use Authorization (EUA).  This EUA will remain in effect (meaning this test can be used) for the duration of the COVID-19 declaration under Section 564(b)(1) of the Act, 21 U.S.C. section 360bbb-3(b)(1), unless the authorization is terminated or revoked sooner.  Performed at [facility], [place] [address], [place], [place] [address]   MRSA PCR Screening     Status: None   Collection Time: 01/07/20  4:16 AM   Specimen: Nasopharyngeal  Result Value Ref Range Status   MRSA by PCR NEGATIVE NEGATIVE Final    Comment:        The GeneXpert MRSA Assay (FDA approved for NASAL specimens only), is one component of a comprehensive MRSA colonization surveillance program. It is not intended to diagnose MRSA infection nor to guide or monitor treatment for MRSA infections. Performed at [facility], [place] [address], [place], [place] [address]          Radiology Studies: CT Abdomen Pelvis Wo Contrast  Result Date: 01/13/2020 CLINICAL DATA:  Abdominal pain and distension, history of liver carcinoma EXAM: CT ABDOMEN AND PELVIS  WITHOUT CONTRAST TECHNIQUE: Multidetector CT imaging of the abdomen and pelvis was performed following the standard protocol without IV contrast. COMPARISON:  12/01/2019 FINDINGS: Lower chest: Mild right basilar atelectasis is noted. No sizable effusion is seen. Heavy coronary calcifications are seen. Hepatobiliary: Liver is shrunken and nodular in appearance consistent with underlying cirrhotic change. Gallbladder is been surgically removed. Considerable amount of perihepatic ascites is noted consistent with portal hypertension. Pancreas: Pancreas is atrophic in nature. Spleen: Spleen is within normal limits although multiple calcified granulomas are seen. Adrenals/Urinary Tract: Adrenal glands are unremarkable. Kidneys demonstrate no renal calculi or obstructive changes. Left cyst is noted stable from the prior study. The bladder is well distended. Stomach/Bowel: Mild diverticular change of the colon is noted without evidence of diverticulitis. No obstructive or inflammatory changes of the colon are seen. The appendix is not visualized consistent with prior surgical excision. Small-bowel and stomach are unremarkable with the exception of a sliding-type hiatal hernia. Vascular/Lymphatic: Aortic atherosclerosis. No enlarged abdominal or pelvic lymph nodes. IVC filter is noted in place and stable. Reproductive: Status post hysterectomy. No adnexal masses. Other: Mild to moderate ascites is noted consistent with portal hypertension. Changes of prior anterior hernia repair are seen and stable. Musculoskeletal: Degenerative changes of the lumbar spine are seen. IMPRESSION: Hepatic cirrhosis Mild to moderate ascites increased from the prior exam likely related to portal hypertension. Diverticulosis without diverticulitis. Chronic changes as described above. Electronically Signed   By: Inez Catalina M.D.   On: 12/26/2019 17:19        Scheduled Meds:  apixaban  5 mg Oral BID   insulin aspart  0-5 Units  Subcutaneous QHS   insulin aspart  0-9 Units Subcutaneous TID WC   insulin glargine  6 Units Subcutaneous QHS   lactulose  40 g Oral TID   levothyroxine  137 mcg Oral QAC breakfast   multivitamin with minerals  1 tablet Oral Daily   pantoprazole  40 mg Oral BID   spironolactone  50 mg Oral BID  Continuous Infusions:  sodium chloride 75 mL/hr at 01/07/20 0210     LOS: 0 days    Time spent:40 min    Kartier Bennison, Geraldo Docker, MD [facility] Pager [phone]  If 7PM-7AM, please contact night-coverage [email or web address] Password [facility] 01/07/2020, 8:18 PM

## 2020-01-07 NOTE — Plan of Care (Signed)
  Problem: Safety: Goal: Ability to remain free from injury will improve Outcome: Progressing   

## 2020-01-08 LAB — RETICULOCYTES
Immature Retic Fract: 10.9 % (ref 2.3–15.9)
RBC.: 3.38 MIL/uL — ABNORMAL LOW (ref 3.87–5.11)
Retic Count, Absolute: 79.1 10*3/uL (ref 19.0–186.0)
Retic Ct Pct: 2.3 % (ref 0.4–3.1)

## 2020-01-08 LAB — FERRITIN: Ferritin: 1066 ng/mL — ABNORMAL HIGH (ref 11–307)

## 2020-01-08 LAB — CBC WITH DIFFERENTIAL/PLATELET
Abs Immature Granulocytes: 0.03 10*3/uL (ref 0.00–0.07)
Basophils Absolute: 0 10*3/uL (ref 0.0–0.1)
Basophils Relative: 0 %
Eosinophils Absolute: 0 10*3/uL (ref 0.0–0.5)
Eosinophils Relative: 1 %
HCT: 30.8 % — ABNORMAL LOW (ref 36.0–46.0)
Hemoglobin: 10.7 g/dL — ABNORMAL LOW (ref 12.0–15.0)
Immature Granulocytes: 1 %
Lymphocytes Relative: 21 %
Lymphs Abs: 0.8 10*3/uL (ref 0.7–4.0)
MCH: 32.4 pg (ref 26.0–34.0)
MCHC: 34.7 g/dL (ref 30.0–36.0)
MCV: 93.3 fL (ref 80.0–100.0)
Monocytes Absolute: 0.3 10*3/uL (ref 0.1–1.0)
Monocytes Relative: 10 %
Neutro Abs: 2.4 10*3/uL (ref 1.7–7.7)
Neutrophils Relative %: 67 %
Platelets: 90 10*3/uL — ABNORMAL LOW (ref 150–400)
RBC: 3.3 MIL/uL — ABNORMAL LOW (ref 3.87–5.11)
RDW: 17.2 % — ABNORMAL HIGH (ref 11.5–15.5)
WBC: 3.6 10*3/uL — ABNORMAL LOW (ref 4.0–10.5)
nRBC: 0 % (ref 0.0–0.2)

## 2020-01-08 LAB — ACETAMINOPHEN LEVEL: Acetaminophen (Tylenol), Serum: <10 ug/mL — ABNORMAL LOW (ref 10–30)

## 2020-01-08 LAB — MAGNESIUM: Magnesium: 2 mg/dL (ref 1.7–2.4)

## 2020-01-08 LAB — GLUCOSE, CAPILLARY
Glucose-Capillary: 114 mg/dL — ABNORMAL HIGH (ref 70–99)
Glucose-Capillary: 153 mg/dL — ABNORMAL HIGH (ref 70–99)
Glucose-Capillary: 204 mg/dL — ABNORMAL HIGH (ref 70–99)
Glucose-Capillary: 157 mg/dL — ABNORMAL HIGH (ref 70–99)

## 2020-01-08 LAB — COMPREHENSIVE METABOLIC PANEL
ALT: 41 U/L (ref 0–44)
AST: 57 U/L — ABNORMAL HIGH (ref 15–41)
Albumin: 2.6 g/dL — ABNORMAL LOW (ref 3.5–5.0)
Alkaline Phosphatase: 388 U/L — ABNORMAL HIGH (ref 38–126)
Anion gap: 18 — ABNORMAL HIGH (ref 5–15)
BUN: 40 mg/dL — ABNORMAL HIGH (ref 8–23)
CO2: 15 mmol/L — ABNORMAL LOW (ref 22–32)
Calcium: 8.7 mg/dL — ABNORMAL LOW (ref 8.9–10.3)
Chloride: 99 mmol/L (ref 98–111)
Creatinine, Ser: 2.3 mg/dL — ABNORMAL HIGH (ref 0.44–1.00)
GFR calc Af Amer: 22 mL/min — ABNORMAL LOW (ref >60)
GFR calc non Af Amer: 19 mL/min — ABNORMAL LOW (ref >60)
Glucose, Bld: 111 mg/dL — ABNORMAL HIGH (ref 70–99)
Potassium: 3.9 mmol/L (ref 3.5–5.1)
Sodium: 132 mmol/L — ABNORMAL LOW (ref 135–145)
Total Bilirubin: 2.3 mg/dL — ABNORMAL HIGH (ref 0.3–1.2)
Total Protein: 5.8 g/dL — ABNORMAL LOW (ref 6.5–8.1)

## 2020-01-08 LAB — PROTIME-INR
INR: 2.1 — ABNORMAL HIGH (ref 0.8–1.2)
Prothrombin Time: 22.9 seconds — ABNORMAL HIGH (ref 11.4–15.2)

## 2020-01-08 LAB — IRON AND TIBC
Iron: 80 ug/dL (ref 28–170)
Saturation Ratios: 57 % — ABNORMAL HIGH (ref 10.4–31.8)
TIBC: 141 ug/dL — ABNORMAL LOW (ref 250–450)
UIBC: 61 ug/dL

## 2020-01-08 LAB — VITAMIN B12: Vitamin B-12: 1190 pg/mL — ABNORMAL HIGH (ref 180–914)

## 2020-01-08 LAB — FOLATE: Folate: 14.8 ng/mL (ref >5.9)

## 2020-01-08 LAB — PROCALCITONIN: Procalcitonin: 0.11 ng/mL

## 2020-01-08 LAB — CORTISOL-AM, BLOOD: Cortisol - AM: 18.4 ug/dL (ref 6.7–22.6)

## 2020-01-08 LAB — OSMOLALITY: Osmolality: 283 mOsm/kg (ref 275–295)

## 2020-01-08 LAB — PHOSPHORUS: Phosphorus: 2.7 mg/dL (ref 2.5–4.6)

## 2020-01-08 MED ORDER — OCTREOTIDE ACETATE 100 MCG/ML IJ SOLN
200.0000 ug | Freq: Two times a day (BID) | INTRAMUSCULAR | Status: DC
  Administered 2020-01-08 – 2020-01-10 (×4): 200 ug via SUBCUTANEOUS
  Filled 2020-01-08 (×5): qty 2

## 2020-01-08 MED ORDER — ALBUMIN HUMAN 25 % IV SOLN
25.0000 g | Freq: Four times a day (QID) | INTRAVENOUS | Status: AC
  Administered 2020-01-08 – 2020-01-09 (×4): 25 g via INTRAVENOUS
  Filled 2020-01-08 (×4): qty 100

## 2020-01-08 MED ORDER — ENSURE ENLIVE PO LIQD
237.0000 mL | Freq: Two times a day (BID) | ORAL | Status: DC
  Administered 2020-01-08 – 2020-01-09 (×3): 237 mL via ORAL

## 2020-01-08 MED ORDER — SODIUM BICARBONATE 650 MG PO TABS
1300.0000 mg | ORAL_TABLET | Freq: Two times a day (BID) | ORAL | Status: DC
  Administered 2020-01-08 – 2020-01-10 (×3): 1300 mg via ORAL
  Filled 2020-01-08 (×5): qty 2

## 2020-01-08 MED ORDER — OXYCODONE HCL 5 MG PO TABS: 2.5000 mg | ORAL_TABLET | Freq: Four times a day (QID) | ORAL | Status: DC | PRN

## 2020-01-08 MED ORDER — MIDODRINE HCL 5 MG PO TABS
10.0000 mg | ORAL_TABLET | Freq: Three times a day (TID) | ORAL | Status: DC
  Administered 2020-01-08 – 2020-01-11 (×6): 10 mg via ORAL
  Filled 2020-01-08 (×6): qty 2

## 2020-01-08 NOTE — Progress Notes (Signed)
Initial Nutrition Assessment  DOCUMENTATION CODES:   Severe malnutrition in context of chronic illness  INTERVENTION:   Recommend liberalizing diet to regular  Ensure Enlive po BID, each supplement provides 350 kcal and 20 grams of protein  MVI daily  NUTRITION DIAGNOSIS:   Severe Malnutrition related to chronic illness (dementia) as evidenced by severe fat depletion, severe muscle depletion, percent weight loss.    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 PMH includes liver cirrhosis, stroke, type 2 DM, CAD, CHF, h/o DVT, aortic stenosis, macular degeneration, dementia.  Pt noted to be A/O x1. Pt is a poor historian.   Attempted to visit with pt. Pt provided no responses to RD questions. Discussed pt with RN.   Per wt readings, pt has lost 22.9% of her body weight over the last 6 months, which is significant for time frame.   PO Intake: 25% x 1 recorded meal  Labs: Na 126 (L), Mg 1.3 (L), CBGs 208-127-115 Medications: Novolog, Lantus, Chronulac, MVI, Protonix, Aldactone  Diet Order:   Diet Order            Diet heart healthy/carb modified Room service appropriate? Yes; Fluid consistency: Thin  Diet effective now                 EDUCATION NEEDS:   Not appropriate for education at this time  Skin:  Skin Assessment: Skin Integrity Issues: Skin Integrity Issues:: Other (Comment) Other: skin tear R/L arm  Last BM:  PTA  Height:   Ht Readings from Last 1 Encounters:  01/01/2020 5' 6"  (1.676 m)    Weight:   Wt Readings from Last 10 Encounters:  01/07/20 58.3 kg  12/11/19 63 kg  12/06/19 63 kg  10/29/19 102 kg  09/12/19 84.9 kg  09/02/19 74.8 kg  07/09/19 75.8 kg  05/13/19 75.8 kg  05/06/19 76.2  kg  12/31/18 82.9 kg    BMI:  Body mass index is 20.75 kg/m.  Estimated Nutritional Needs:   Kcal:  1600-1800  Protein:  80-95 grams  Fluid:  >/=1.6L/d    Larkin Ina, MS, RD, LDN RD pager number and weekend/on-call pager number located in [place].

## 2020-01-08 NOTE — Consult Note (Signed)
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    Comment: Glucose reference range applies only to samples taken after fasting for at least 8 hours.   Comment 1 Notify RN    Comment 2 Document in Chart   Glucose, capillary     Status: Abnormal   Collection Time: 01/07/20  8:38 PM  Result Value Ref Range   Glucose-Capillary 115 (H) 70 - 99 mg/dL    Comment: Glucose reference range applies only to samples taken after fasting for at  least 8 hours.  Comprehensive metabolic panel     Status: Abnormal   Collection Time: 01/08/20  6:32 AM  Result Value Ref Range   Sodium 132 (L) 135 - 145 mmol/L    Potassium 3.9 3.5 - 5.1 mmol/L   Chloride 99 98 - 111 mmol/L   CO2 15 (L) 22 - 32 mmol/L   Glucose, Bld 111 (H) 70 - 99 mg/dL    Comment: Glucose reference range applies only to samples taken after fasting for at least 8 hours.   BUN 40 (H) 8 - 23 mg/dL   Creatinine, Ser 2.30 (H) 0.44 - 1.00 mg/dL   Calcium 8.7 (L) 8.9 - 10.3 mg/dL   Total Protein 5.8 (L) 6.5 - 8.1 g/dL   Albumin 2.6 (L) 3.5 - 5.0 g/dL   AST 57 (H) 15 - 41 U/L   ALT 41 0 - 44 U/L   Alkaline Phosphatase 388 (H) 38 - 126 U/L   Total Bilirubin 2.3 (H) 0.3 - 1.2 mg/dL   GFR calc non Af Amer 19 (L) >60 mL/min   GFR calc Af Amer 22 (L) >60 mL/min   Anion gap 18 (H) 5 - 15    Comment: Performed at [facility], [place] [address], [place], [place] [address]  Magnesium     Status: None   Collection Time: 01/08/20  6:32 AM  Result Value Ref Range   Magnesium 2.0 1.7 - 2.4 mg/dL    Comment: Performed at [facility] [address] [place], [place] [address]  Phosphorus     Status: None   Collection Time: 01/08/20  6:32 AM  Result Value Ref Range   Phosphorus 2.7 2.5 - 4.6 mg/dL    Comment: Performed at [facility] [address] [place], [place] [address]  Osmolality     Status: None   Collection Time: 01/08/20  6:32 AM  Result Value Ref Range   Osmolality 283 275 - 295 mOsm/kg    Comment: Performed at [facility] [address], [place], [place] [address]  Cortisol-am, blood     Status: None   Collection Time: 01/08/20  6:32 AM  Result Value Ref Range   Cortisol - AM 18.4 6.7 - 22.6 ug/dL    Comment: Performed at [facility], [place] [address] [place], [place] [address]  Vitamin B12     Status: Abnormal   Collection Time: 01/08/20  6:32 AM  Result Value Ref Range   Vitamin B-12 1,190 (H) 180 - 914 pg/mL    Comment: (NOTE) This assay is not validated for testing neonatal or myeloproliferative syndrome specimens for Vitamin B12 levels. Performed at [facility], [place] [address], [place], [place] [address]   Reticulocytes     Status: Abnormal   Collection Time: 01/08/20  6:32 AM  Result Value Ref Range   Retic Ct Pct 2.3 0.4 - 3.1 %   RBC. 3.38 (L) 3.87 - 5.11 MIL/uL   Retic Count, Absolute 79.1 19.0 - 186.0 K/uL   Immature Retic Fract 10.9 2.3 - 15.9 %    Comment: Performed at [facility] [address], [place], [place] [address]  Glucose, capillary     Status: Abnormal   Collection Time: 01/08/20  6:43 AM  Result Value Ref Range   Glucose-Capillary 114 (H) 70 - 99 mg/dL    Comment: Glucose reference range applies only to samples taken after fasting for at  least 8 hours.   CT Abdomen Pelvis Wo Contrast  Result Date: 01/02/2020 CLINICAL DATA:  Abdominal pain and distension, history of liver carcinoma EXAM: CT ABDOMEN AND PELVIS WITHOUT CONTRAST TECHNIQUE: Multidetector CT imaging of the abdomen and pelvis was performed following the standard protocol without IV contrast. COMPARISON:  12/01/2019 FINDINGS: Lower chest: Mild right basilar atelectasis is noted. No sizable effusion is seen. Heavy coronary calcifications are seen. Hepatobiliary: Liver is shrunken and nodular in appearance consistent with underlying cirrhotic change. Gallbladder is been surgically removed. Considerable amount of perihepatic ascites is noted consistent with portal hypertension. Pancreas: Pancreas is atrophic in nature. Spleen: Spleen is within normal limits although multiple calcified granulomas are seen. Adrenals/Urinary Tract: Adrenal glands are unremarkable. Kidneys demonstrate no renal calculi or obstructive changes. Left cyst is noted stable from the prior study. The bladder is well distended. Stomach/Bowel: Mild diverticular change of the colon is noted without evidence of diverticulitis. No obstructive or inflammatory changes of the colon are seen. The appendix is not visualized consistent with prior surgical excision. Small-bowel and stomach are unremarkable with the exception of a  sliding-type hiatal hernia. Vascular/Lymphatic: Aortic atherosclerosis. No enlarged abdominal or pelvic lymph nodes. IVC filter is noted in place and stable. Reproductive: Status post hysterectomy. No adnexal masses. Other: Mild to moderate ascites is noted consistent with portal hypertension. Changes of prior anterior hernia repair are seen and stable. Musculoskeletal: Degenerative changes of the lumbar spine are seen. IMPRESSION: Hepatic cirrhosis Mild to moderate ascites increased from the prior exam likely related to portal hypertension. Diverticulosis without diverticulitis. Chronic changes as described above. Electronically Signed   By: Inez Catalina M.D.   On: 12/31/2019 17:19    ROS  Blood pressure (!) 98/59, pulse 80, temperature 98.2 F (36.8 C), temperature source Oral, resp. rate 16, height 5' 6"  (1.676 m), weight 58.3 kg, SpO2 98 %. Physical Exam Constitutional:      Appearance: She is ill-appearing.  HENT:     Head: Normocephalic and atraumatic.  Eyes:     Extraocular Movements: Extraocular movements intact.  Cardiovascular:     Rate and Rhythm: Normal rate and regular rhythm.     Pulses: Normal pulses.     Heart sounds: Murmur heard.   Pulmonary:     Effort: Pulmonary effort is normal. No respiratory distress.  Abdominal:     General: There is distension.     Palpations: There is shifting dullness and fluid wave.     Tenderness: There is abdominal tenderness (diffuse).  Musculoskeletal:     Cervical back: Normal range of motion.     Right lower leg: No edema.     Left lower leg: No edema.  Skin:    General: Skin is warm and dry.  Neurological:     Mental Status: She is disoriented.     Motor: Weakness present.      Assessment/Plan  1. Nonoliguric AKI on CKD (Stage IV) - Patient has improvement of kidney function that is almost back to her baseline. He Cr is 2.30 (GFR 19) and baseline Cr of 2.17 (GFR 20). Patients AKI likely 2/2 to pre-renal azotemia due to  decreased po intake vs hypotension. She likely has some level of chronic HRS. Urine Na is pending. She may benefit from further albumin supplementation. Patient has baseline dementia and is only oriented to self. Likely has a component of uremic/hepatic encephalopathy vs delirium. She will need close follow up with nephrology at discharge. - CT abdomen showed no bladder or  kidney pathology - Urine Na pending  - UA showed hylaine cast and asymptomatic bacturia but otherwise wnl. - Avoid nephrotoxic medications  - Consider further albumin supplementation  - Maintain MAPs >60, consider adding midodrine.  - I don't think her blood pressure can tolerate diuresis at this time.   2. Hyponatremia - Patient presented with hyponatremia in the setting of decompensated cirrhosis and acute on chronic kidney disease. Patient does have signs of extravascular hypervolemia on exam, making cirrhosis/hypoalbuminemai vs acute renal failure the likely cause of her hyponatremia. Her Na has improved to 132 today. Patients mentation unlikely related to serum Na levels.  - Patient will need fluid and salt restricted diet.  - Urine Na and osmolality pending - Patient will need diuresis to help with the hyponatremia.   3. AGMA - patient presented with a mild lactic acidosis (2.6) in the setting of decompensated cirrhosis and an episodes of hypotension.  - Keep MAPs > 60 - Consider adding bicarb.   4. Decompensated Cirrhosis - Pateint presents with decompensated cirrhosis with concern for hepatic encephalopathy. Currently on lactulose. Patient will ultimately need diuresis, but I ont think her blood pressure could tolerate it right now.      Marianna Payment, D.O. [facility], PGY-2 Pager: [phone], Phone: [phone] Date 01/08/2020 Time 11:40 AM

## 2020-01-08 NOTE — Plan of Care (Signed)
  Problem: Education: Goal: Knowledge of General Education information will improve Description: Including pain rating scale, medication(s)/side effects and non-pharmacologic comfort measures Outcome: Progressing   Problem: Activity: Goal: Risk for activity intolerance will decrease Outcome: Progressing   Problem: Skin Integrity: Goal: Risk for impaired skin integrity will decrease Outcome: Progressing
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
## 2020-01-08 NOTE — TOC Progression Note (Signed)
Transition of Care [facility]) - Progression Note    Patient Details  Name: Tonya Hunter MRN: [id number] Date of Birth: 01-23-1935  Transition of [facility]) CM/SW Contact  Sharlet Salina Mila Homer, LCSW Phone Number: 01/08/2020, 5:00 PM  Clinical Narrative:   Talked with daughter [name] ([phone]) and daughter [name] POA [phone]) regarding patient and discharge disposition. CSW advised by daughter [name] (12:51 pm) that they want a facility in [place] as she works in [place]. Daughter added that she wants her mom to have the best and peaceful care. CSW later talked with [name] [facility] POA) regarding patient's d/c disposition and she is in agreement with a facility in [place], so that her sister [name] can visit her.  CSW will continue to follow and provide daughters with facility responses on Thursday, 8/25.     Expected Discharge Plan: [place] (Return to [facility]) Barriers to Discharge: Continued Medical Work up  Expected Discharge Plan and Services Expected Discharge Plan: [place] (Return to [facility]) In-house Referral: Clinical Social Work     Living arrangements for the past 2 months: [place]                                       Social Determinants of Health (SDOH) Interventions  No SDOH interventions requested or needed at this time  Readmission Risk Interventions Readmission Risk Prevention Plan 12/06/2019 09/16/2019  Transportation Screening Complete Complete  PCP or Specialist Appt within 5-7 Days - Complete  PCP or Specialist Appt within 3-5 Days Complete -  Home Care Screening - Complete  Medication Review (RN CM) - Complete  HRI or Home Care Consult Complete -  Social Work Consult for Recovery Care Planning/Counseling Complete -  Palliative Care Screening Complete -  Medication Review Press photographer) Complete -  Some recent data might be hidden

## 2020-01-08 NOTE — Progress Notes (Addendum)
PROGRESS NOTE    Tonya Hunter  HEN:[id number] DOB: 1935/04/03 DOA: 12/21/2019 PCP: [name], MD   Brief Narrative: 85 year old past medical history significant for dementia, Tonya Hunter, liver cirrhosis, stroke, diabetes type 2, CAD, CHF, history of DVT, aortic stenosis, macular degeneration who presents complaining of abdominal pain.  Patient reports abdominal pain and abdominal distention.  No fever no nausea no vomiting.  She does have a chronic history of diarrhea secondary to medication. In the ED patient underwent paracentesis, without any evidence of SBP.  She was found to have worsening hyponatremia and worsening kidney function.     Assessment & Plan:   Principal Problem:   Hyponatremia Active Problems:   Diabetes mellitus type 2, insulin dependent ([place])   DNR (do not resuscitate)   NASH (nonalcoholic steatohepatitis)   Thrombocytopenia (HCC)   Cirrhosis of liver with ascites (HCC)   History of DVT (deep vein thrombosis)   Acute kidney injury superimposed on CKD (HCC)   Generalized weakness   Acute metabolic encephalopathy   Dementia without behavioral disturbance (HCC)   Acute renal failure superimposed on stage 4 chronic kidney disease ([place])   Severe protein-calorie malnutrition ([name]: less than 60% of standard weight) ([place])   1-Acute metabolic, hepatic encephalopathy; Also underlying dementia. Ammonia level 56 B 12: 1190 Continue with the lactulose Repeat ammonia level in the morning. Alert and conversant per  daughter.  2-Hyponatremia: Worsening sodium level on admission might be related to hypovolemia dehydration Improved with IV fluids Urine osmolality and sodium order Improved back to baseline.  3-Acute on CKD stage IV: Creatinine baseline 2.1 months ago Might be related to hypovolemia but also concern with hepatorenal syndrome type I Nephrology consulted  4-diabetes type 2: Continue with Lantus and sliding scale insulin  NASH/cirrhosis of the  liver with ascites -Underwent paracentesis in the ED fluids negative for SBP -Holding spironolactone due to AKI -Follow nephrology recommendation in regards paracentesis to AKI -Meld Score; 28, 19 % estimated 3 month mortality  -palliative care consult for Goals of care.   Thrombocytopenia: Secondary to cirrhosis.  Continue to monitor  Generalized weakness PT will require SNF  Hypomagnesemia: Replete IV  Severe  protein caloric malnutrition: Nutrition consulted.  Liberalize diet  Metabolic acidosis; related to Renal failure.  Nephrology consulted    Estimated body mass index is 20.75 kg/m as calculated from the following:   Height as of this encounter: 5' 6"  (1.676 m).   Weight as of this encounter: 58.3 kg.   DVT prophylaxis: Eliquis Code Status: DNR Family Communication: Daughter at bedside.  Disposition Plan:  Status is: Inpatient  Remains inpatient appropriate because:Hemodynamically unstable   Dispo: The patient is from: SNF              Anticipated d/c is to: SNF              Anticipated d/c date is: 2 days              Patient currently is not medically stable to d/c.        Consultants:   Nephrology  Palliative care  Procedures:   Paracentesis 8/23: 250 mL aspirated  Antimicrobials:    Subjective: Patient is alert, she is feeling a little bit better.  She report mild abdominal discomfort. Daughter at bedside reports patient been having some pain in her legs.  Probably from the stiffness. Daughter  reported that her mother was giving some vitamins by her husband.  Unknown quantitive and what kind of  medication.   Objective: Vitals:   01/07/20 1656 01/07/20 2037 01/08/20 0451 01/08/20 0800  BP: (!) 90/57 (!) 94/58 (!) 100/54 (!) 98/59  Pulse: 77 80 85 80  Resp: 13 15 14 16   Temp: (!) 97.4 F (36.3 C) 98.2 F (36.8 C) 98.4 F (36.9 C) 98.2 F (36.8 C)  TempSrc: Oral   Oral  SpO2: 95% 98% 97% 98%  Weight:  58.3 kg    Height:         Intake/Output Summary (Last 24 hours) at 01/08/2020 1110 Last data filed at 01/08/2020 1007 Gross per 24 hour  Intake 2128.87 ml  Output 600 ml  Net 1528.87 ml   Filed Weights   01/05/2020 1403 01/07/20 0145 01/07/20 2037  Weight: 63.5 kg 58.3 kg 58.3 kg    Examination:  General exam: Appears calm and comfortable, chronic ill appearing.  Respiratory system: Clear to auscultation. Respiratory effort normal. Cardiovascular system: S1 & S2 heard, RRR. No JVD, murmurs, rubs, gallops or clicks. No pedal edema. Gastrointestinal system: Abdomen is distended, mild tender.  Central nervous system: Alert  Extremities: Symmetric 5 x 5 power.    Data Reviewed: I have personally reviewed following labs and imaging studies  CBC: Recent Labs  Lab 01/03/2020 1459 01/07/20 0833  WBC 4.7 3.8*  NEUTROABS 3.7 2.4  HGB 13.4 10.8*  HCT 38.5 30.3*  MCV 92.3 89.9  PLT 132* 295*   Basic Metabolic Panel: Recent Labs  Lab 01/02/2020 1459 01/07/20 0833 01/08/20 0632  NA 125* 126* 132*  K 4.0 3.5 3.9  CL 89* 92* 99  CO2 22 20* 15*  GLUCOSE 178* 193* 111*  BUN 39* 41* 40*  CREATININE 2.79* 2.74* 2.30*  CALCIUM 9.0 8.7* 8.7*  MG  --  1.3* 2.0  PHOS  --  3.2 2.7   GFR: Estimated Creatinine Clearance: 17.1 mL/min (A) (by C-G formula based on SCr of 2.3 mg/dL (H)). Liver Function Tests: Recent Labs  Lab 01/12/2020 1459 01/07/20 0833 01/08/20 0632  AST 61* 51* 57*  ALT 51* 45* 41  ALKPHOS 502* 394* 388*  BILITOT 2.7* 2.4* 2.3*  PROT 6.8 5.9* 5.8*  ALBUMIN 2.8* 2.5* 2.6*   Recent Labs  Lab 01/07/2020 1459  LIPASE 48   Recent Labs  Lab 01/01/2020 1459  AMMONIA 56*   Coagulation Profile: No results for input(s): INR, PROTIME in the last 168 hours. Cardiac Enzymes: No results for input(s): CKTOTAL, CKMB, CKMBINDEX, TROPONINI in the last 168 hours. BNP (last 3 results) No results for input(s): PROBNP in the last 8760 hours. HbA1C: No results for input(s): HGBA1C in the last 72  hours. CBG: Recent Labs  Lab 01/07/20 0642 01/07/20 1134 01/07/20 1656 01/07/20 2038 01/08/20 0643  GLUCAP 148* 208* 127* 115* 114*   Lipid Profile: No results for input(s): CHOL, HDL, LDLCALC, TRIG, CHOLHDL, LDLDIRECT in the last 72 hours. Thyroid Function Tests: No results for input(s): TSH, T4TOTAL, FREET4, T3FREE, THYROIDAB in the last 72 hours. Anemia Panel: Recent Labs    01/08/20 0632  VITAMINB12 1,190*  RETICCTPCT 2.3   Sepsis Labs: Recent Labs  Lab 12/24/2019 1459 01/07/20 0833 01/07/20 1126  PROCALCITON  --  0.12  --   LATICACIDVEN 2.8* 1.7 2.0*    Recent Results (from the past 240 hour(s))  Culture, body fluid-bottle     Status: None (Preliminary result)   Collection Time: 12/20/2019  5:06 PM   Specimen: Fluid  Result Value Ref Range Status   Specimen Description FLUID PERITONEAL  Final  Special Requests   Final    BOTTLES DRAWN AEROBIC AND ANAEROBIC Blood Culture adequate volume   Culture   Final    NO GROWTH 2 DAYS Performed at [facility], [place] [address], [place], [place] [address]    Report Status PENDING  Incomplete  Gram stain     Status: None   Collection Time: 12/17/2019  5:06 PM   Specimen: Fluid  Result Value Ref Range Status   Specimen Description FLUID PERITONEAL  Final   Special Requests NONE  Final   Gram Stain   Final    RARE WBC PRESENT, PREDOMINANTLY MONONUCLEAR NO ORGANISMS SEEN Performed at [facility], [place] [address], [place], [place] [address]    Report Status 01/08/2020 FINAL  Final  SARS Coronavirus 2 by RT PCR (hospital order, performed in [facility] hospital lab) Nasopharyngeal Nasopharyngeal Swab     Status: None   Collection Time: 01/04/2020  7:13 PM   Specimen: Nasopharyngeal Swab  Result Value Ref Range Status   SARS Coronavirus 2 NEGATIVE NEGATIVE Final    Comment: (NOTE) SARS-CoV-2 target nucleic acids are NOT DETECTED.  The SARS-CoV-2 RNA is generally detectable in upper and lower respiratory  specimens during the acute phase of infection. The lowest concentration of SARS-CoV-2 viral copies this assay can detect is 250 copies / mL. A negative result does not preclude SARS-CoV-2 infection and should not be used as the sole basis for treatment or other patient management decisions.  A negative result may occur with improper specimen collection / handling, submission of specimen other than nasopharyngeal swab, presence of viral mutation(s) within the areas targeted by this assay, and inadequate number of viral copies (<250 copies / mL). A negative result must be combined with clinical observations, patient history, and epidemiological information.  Fact Sheet for Patients:   StrictlyIdeas.no  Fact Sheet for Healthcare Providers: BankingDealers.co.za  This test is not yet approved or  cleared by the [place] FDA and has been authorized for detection and/or diagnosis of SARS-CoV-2 by FDA under an Emergency Use Authorization (EUA).  This EUA will remain in effect (meaning this test can be used) for the duration of the COVID-19 declaration under Section 564(b)(1) of the Act, 21 U.S.C. section 360bbb-3(b)(1), unless the authorization is terminated or revoked sooner.  Performed at [facility], [place] [address], [place], [place] [address]   MRSA PCR Screening     Status: None   Collection Time: 01/07/20  4:16 AM   Specimen: Nasopharyngeal  Result Value Ref Range Status   MRSA by PCR NEGATIVE NEGATIVE Final    Comment:        The GeneXpert MRSA Assay (FDA approved for NASAL specimens only), is one component of a comprehensive MRSA colonization surveillance program. It is not intended to diagnose MRSA infection nor to guide or monitor treatment for MRSA infections. Performed at [facility], [place] [address] [place], [place] [address]          Radiology Studies: CT Abdomen Pelvis Wo Contrast  Result  Date: 12/25/2019 CLINICAL DATA:  Abdominal pain and distension, history of liver carcinoma EXAM: CT ABDOMEN AND PELVIS WITHOUT CONTRAST TECHNIQUE: Multidetector CT imaging of the abdomen and pelvis was performed following the standard protocol without IV contrast. COMPARISON:  12/01/2019 FINDINGS: Lower chest: Mild right basilar atelectasis is noted. No sizable effusion is seen. Heavy coronary calcifications are seen. Hepatobiliary: Liver is shrunken and nodular in appearance consistent with underlying cirrhotic change.  Gallbladder is been surgically removed. Considerable amount of perihepatic ascites is noted consistent with portal hypertension. Pancreas: Pancreas is atrophic in nature. Spleen: Spleen is within normal limits although multiple calcified granulomas are seen. Adrenals/Urinary Tract: Adrenal glands are unremarkable. Kidneys demonstrate no renal calculi or obstructive changes. Left cyst is noted stable from the prior study. The bladder is well distended. Stomach/Bowel: Mild diverticular change of the colon is noted without evidence of diverticulitis. No obstructive or inflammatory changes of the colon are seen. The appendix is not visualized consistent with prior surgical excision. Small-bowel and stomach are unremarkable with the exception of a sliding-type hiatal hernia. Vascular/Lymphatic: Aortic atherosclerosis. No enlarged abdominal or pelvic lymph nodes. IVC filter is noted in place and stable. Reproductive: Status post hysterectomy. No adnexal masses. Other: Mild to moderate ascites is noted consistent with portal hypertension. Changes of prior anterior hernia repair are seen and stable. Musculoskeletal: Degenerative changes of the lumbar spine are seen. IMPRESSION: Hepatic cirrhosis Mild to moderate ascites increased from the prior exam likely related to portal hypertension. Diverticulosis without diverticulitis. Chronic changes as described above. Electronically Signed   By: Inez Catalina M.D.    On: 01/02/2020 17:19        Scheduled Meds: . apixaban  5 mg Oral BID  . insulin aspart  0-5 Units Subcutaneous QHS  . insulin aspart  0-9 Units Subcutaneous TID WC  . insulin glargine  6 Units Subcutaneous QHS  . lactulose  40 g Oral TID  . levothyroxine  137 mcg Oral QAC breakfast  . multivitamin with minerals  1 tablet Oral Daily  . pantoprazole  40 mg Oral BID   Continuous Infusions:   LOS: 1 day    Time spent:    Elmarie Shiley, MD [facility]   If 7PM-7AM, please contact night-coverage [email or web address]  01/08/2020, 11:10 AM   Minutes.

## 2020-01-09 ENCOUNTER — Inpatient Hospital Stay (HOSPITAL_COMMUNITY): Payer: Medicare Other

## 2020-01-09 DIAGNOSIS — K7460 Unspecified cirrhosis of liver: Secondary | ICD-10-CM

## 2020-01-09 DIAGNOSIS — R188 Other ascites: Secondary | ICD-10-CM

## 2020-01-09 DIAGNOSIS — Z7189 Other specified counseling: Secondary | ICD-10-CM

## 2020-01-09 DIAGNOSIS — Z66 Do not resuscitate: Secondary | ICD-10-CM

## 2020-01-09 DIAGNOSIS — Z515 Encounter for palliative care: Secondary | ICD-10-CM

## 2020-01-09 HISTORY — PX: IR PARACENTESIS: IMG2679

## 2020-01-09 LAB — CBC WITH DIFFERENTIAL/PLATELET
Abs Immature Granulocytes: 0.01 10*3/uL (ref 0.00–0.07)
Basophils Absolute: 0 10*3/uL (ref 0.0–0.1)
Basophils Relative: 0 %
Eosinophils Absolute: 0 10*3/uL (ref 0.0–0.5)
Eosinophils Relative: 1 %
HCT: 28.1 % — ABNORMAL LOW (ref 36.0–46.0)
Hemoglobin: 10.1 g/dL — ABNORMAL LOW (ref 12.0–15.0)
Immature Granulocytes: 0 %
Lymphocytes Relative: 19 %
Lymphs Abs: 0.7 10*3/uL (ref 0.7–4.0)
MCH: 33.1 pg (ref 26.0–34.0)
MCHC: 35.9 g/dL (ref 30.0–36.0)
MCV: 92.1 fL (ref 80.0–100.0)
Monocytes Absolute: 0.3 10*3/uL (ref 0.1–1.0)
Monocytes Relative: 8 %
Neutro Abs: 2.5 10*3/uL (ref 1.7–7.7)
Neutrophils Relative %: 72 %
Platelets: 92 10*3/uL — ABNORMAL LOW (ref 150–400)
RBC: 3.05 MIL/uL — ABNORMAL LOW (ref 3.87–5.11)
RDW: 17.2 % — ABNORMAL HIGH (ref 11.5–15.5)
WBC: 3.4 10*3/uL — ABNORMAL LOW (ref 4.0–10.5)
nRBC: 0 % (ref 0.0–0.2)

## 2020-01-09 LAB — URINALYSIS, ROUTINE W REFLEX MICROSCOPIC
Bilirubin Urine: NEGATIVE
Glucose, UA: NEGATIVE mg/dL
Hgb urine dipstick: NEGATIVE
Ketones, ur: NEGATIVE mg/dL
Nitrite: NEGATIVE
Protein, ur: NEGATIVE mg/dL
Specific Gravity, Urine: 1.013 (ref 1.005–1.030)
pH: 5 (ref 5.0–8.0)

## 2020-01-09 LAB — COMPREHENSIVE METABOLIC PANEL
ALT: 37 U/L (ref 0–44)
AST: 43 U/L — ABNORMAL HIGH (ref 15–41)
Albumin: 3.3 g/dL — ABNORMAL LOW (ref 3.5–5.0)
Alkaline Phosphatase: 356 U/L — ABNORMAL HIGH (ref 38–126)
Anion gap: 13 (ref 5–15)
BUN: 36 mg/dL — ABNORMAL HIGH (ref 8–23)
CO2: 23 mmol/L (ref 22–32)
Calcium: 9 mg/dL (ref 8.9–10.3)
Chloride: 95 mmol/L — ABNORMAL LOW (ref 98–111)
Creatinine, Ser: 2.14 mg/dL — ABNORMAL HIGH (ref 0.44–1.00)
GFR calc Af Amer: 24 mL/min — ABNORMAL LOW (ref >60)
GFR calc non Af Amer: 21 mL/min — ABNORMAL LOW (ref >60)
Glucose, Bld: 159 mg/dL — ABNORMAL HIGH (ref 70–99)
Potassium: 3.7 mmol/L (ref 3.5–5.1)
Sodium: 131 mmol/L — ABNORMAL LOW (ref 135–145)
Total Bilirubin: 2.2 mg/dL — ABNORMAL HIGH (ref 0.3–1.2)
Total Protein: 5.9 g/dL — ABNORMAL LOW (ref 6.5–8.1)

## 2020-01-09 LAB — PROCALCITONIN: Procalcitonin: 0.1 ng/mL

## 2020-01-09 LAB — MAGNESIUM: Magnesium: 2 mg/dL (ref 1.7–2.4)

## 2020-01-09 LAB — PHOSPHORUS: Phosphorus: 2.3 mg/dL — ABNORMAL LOW (ref 2.5–4.6)

## 2020-01-09 LAB — NA AND K (SODIUM & POTASSIUM), RAND UR
Potassium Urine: 27 mmol/L
Sodium, Ur: <10 mmol/L

## 2020-01-09 LAB — OSMOLALITY, URINE: Osmolality, Ur: 387 mOsm/kg (ref 300–900)

## 2020-01-09 LAB — GLUCOSE, CAPILLARY
Glucose-Capillary: 128 mg/dL — ABNORMAL HIGH (ref 70–99)
Glucose-Capillary: 130 mg/dL — ABNORMAL HIGH (ref 70–99)
Glucose-Capillary: 121 mg/dL — ABNORMAL HIGH (ref 70–99)

## 2020-01-09 LAB — AMMONIA: Ammonia: 138 umol/L — ABNORMAL HIGH (ref 9–35)

## 2020-01-09 MED ORDER — K PHOS MONO-SOD PHOS DI & MONO 155-852-130 MG PO TABS
500.0000 mg | ORAL_TABLET | Freq: Once | ORAL | Status: AC
  Administered 2020-01-09: 500 mg via ORAL
  Filled 2020-01-09: qty 2

## 2020-01-09 MED ORDER — LACTULOSE ENEMA
300.0000 mL | Freq: Two times a day (BID) | ORAL | Status: DC
  Administered 2020-01-10: 300 mL via RECTAL
  Filled 2020-01-09 (×6): qty 300

## 2020-01-09 MED ORDER — LACTULOSE 10 GM/15ML PO SOLN
40.0000 g | Freq: Four times a day (QID) | ORAL | Status: DC
  Administered 2020-01-09 – 2020-01-11 (×2): 40 g via ORAL
  Filled 2020-01-09 (×6): qty 60

## 2020-01-09 MED ORDER — ALBUMIN HUMAN 25 % IV SOLN
25.0000 g | Freq: Four times a day (QID) | INTRAVENOUS | Status: AC
  Administered 2020-01-09 (×3): 25 g via INTRAVENOUS
  Filled 2020-01-09 (×4): qty 100

## 2020-01-09 MED ORDER — RIFAXIMIN 550 MG PO TABS
550.0000 mg | ORAL_TABLET | Freq: Two times a day (BID) | ORAL | Status: DC
  Administered 2020-01-09: 550 mg via ORAL
  Filled 2020-01-09 (×7): qty 1

## 2020-01-09 MED ORDER — LIDOCAINE HCL (PF) 1 % IJ SOLN
INTRAMUSCULAR | Status: DC | PRN
  Administered 2020-01-09: 10 mL

## 2020-01-09 MED ORDER — LIDOCAINE HCL 1 % IJ SOLN
INTRAMUSCULAR | Status: AC
  Filled 2020-01-09: qty 20

## 2020-01-09 NOTE — TOC Progression Note (Signed)
Transition of Care [facility]) - Progression Note    Patient Details  Name: Tonya Hunter MRN: [id number] Date of Birth: [birth year]  Transition of Care [facility] CM/SW Contact  Bartholomew Crews, RN Phone Number: [phone] 01/09/2020, 2:33 PM  Clinical Narrative:     Spoke with patient's daughter, Tonya Hunter, on the phone this morning along with CSW. Tonya Hunter asking questions about bring patient home to her sister, [name]'s, home. Discussed HH RN, PT visits vs home care visits for patient to be assisted with adls 7 days a week. Discussed choice of agency, and Tonya Hunter was familiar with [facility]. Discussed that [facility] provides both home health and home care.  Received notification of family decision to bring patient home with hospice services. Noted request to not say word hospice to patient. TOC will assist with transition needs as patient nears readiness for discharge.   Expected Discharge Plan: [place] (Return to [facility]) Barriers to Discharge: Continued Medical Work up  Expected Discharge Plan and Services Expected Discharge Plan: [place] (Return to [facility]) In-house Referral: Clinical Social Work     Living arrangements for the past 2 months: [place]                                       Social Determinants of Health (SDOH) Interventions    Readmission Risk Interventions Readmission Risk Prevention Plan 12/06/2019 09/16/2019  Transportation Screening Complete Complete  PCP or Specialist Appt within 5-7 Days - Complete  PCP or Specialist Appt within 3-5 Days Complete -  Home Care Screening - Complete  Medication Review (RN CM) - Complete  HRI or Home Care Consult Complete -  Social Work Consult for Recovery Care Planning/Counseling Complete -  Palliative Care Screening Complete -  Medication Review Press photographer) Complete -  Some recent data might be hidden

## 2020-01-09 NOTE — Progress Notes (Signed)
   01/09/20 0316  Provider Notification  Provider Name/Title Dr. Myna Hidalgo  Date Provider Notified 01/09/20  Time Provider Notified [phone]  Notification Type Page  Notification Reason Change in status (No UOP this shift - Bladder Scan >457)  Response See new orders  Date of Provider Response 01/09/20   Monitored patient during the night.  Still no UOP.  Bladder Scan showed > 457.  Abdomen distended.  Dr. Myna Hidalgo made aware.  Order received for I & O catheter.  This was done without difficulty.  400 cc urine obtained.  Urine sent to lab per previous orders.  Will continue to monitor patient.  Earleen Reaper RN
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
## 2020-01-09 NOTE — Plan of Care (Signed)
°  Problem: Activity: Goal: Risk for activity intolerance will decrease Outcome: Progressing   Problem: Nutrition: Goal: Adequate nutrition will be maintained Outcome: Progressing   Problem: Skin Integrity: Goal: Risk for impaired skin integrity will decrease Outcome: Progressing
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
## 2020-01-09 NOTE — Progress Notes (Addendum)
PROGRESS NOTE    Tonya Hunter  MBW:[id number] DOB: 1934-10-17 DOA: 12/19/2019 PCP: [name], MD   Brief Narrative: 85 year old past medical history significant for dementia, Tonya Hunter, liver cirrhosis, stroke, diabetes type 2, CAD, CHF, history of DVT, aortic stenosis, macular degeneration who presents complaining of abdominal pain.  Patient reports abdominal pain and abdominal distention.  No fever no nausea no vomiting.  She does have a chronic history of diarrhea secondary to medication. In the ED patient underwent paracentesis, without any evidence of SBP.  She was found to have worsening hyponatremia and worsening kidney function.     Assessment & Plan:   Principal Problem:   Hyponatremia Active Problems:   Diabetes mellitus type 2, insulin dependent (Tonya Hunter)   DNR (do not resuscitate)   NASH (nonalcoholic steatohepatitis)   Thrombocytopenia (HCC)   Cirrhosis of liver with ascites (HCC)   History of DVT (deep vein thrombosis)   Acute kidney injury superimposed on CKD (HCC)   Generalized weakness   Acute metabolic encephalopathy   Dementia without behavioral disturbance (HCC)   Acute renal failure superimposed on stage 4 chronic kidney disease (Tonya Hunter)   Severe protein-calorie malnutrition (Tonya Hunter: less than 60% of standard weight) (Tonya Hunter)   1-Acute metabolic, hepatic encephalopathy; Also underlying dementia. Ammonia level 56--138 B 12: 1190 Continue with the lactulose Ammonia elevated today at 138. Increase lactulose, add rifaximin.   2-Hyponatremia: Worsening sodium level on admission might be related to hypovolemia dehydration Improved with IV fluids Urine osmolality and sodium order Improved back to baseline.  3-Acute on CKD stage IV: hepatorenal syndrome.  Creatinine baseline 2.1 months ago Might be related to hypovolemia but also concern with hepatorenal syndrome type I Nephrology consulted On midodrine, albumin and octreotide.   4-Diabetes type 2: Continue  with Lantus and sliding scale insulin  NASH/cirrhosis of the liver with ascites -Underwent paracentesis in the ED fluids negative for SBP -Holding spironolactone due to AKI -Follow nephrology recommendation in regards paracentesis to AKI -Meld Score; 28, 19 % estimated 3 month mortality  -palliative care consult for Goals of care. Plan to discharge home with hospice.  -plan to proceed with paracentesis.   Thrombocytopenia: Secondary to cirrhosis.  Continue to monitor  Generalized weakness PT will require SNF  Hypomagnesemia: Replete IV  Severe  protein caloric malnutrition: Nutrition consulted.  Liberalize diet  Metabolic acidosis; related to Renal failure.  Nephrology consulted Started on oral bicarb.    Estimated body mass index is 20.75 kg/m as calculated from the following:   Height as of this encounter: 5' 6"  (1.676 m).   Weight as of this encounter: 58.3 kg.   DVT prophylaxis: Eliquis Code Status: DNR Family Communication: Daughter at bedside.  Disposition Plan:  Status is: Inpatient  Remains inpatient appropriate because:Hemodynamically unstable   Dispo: The patient is from: SNF              Anticipated d/c is to: SNF              Anticipated d/c date is: 2 days              Patient currently is not medically stable to d/c.        Consultants:   Nephrology  Palliative care  Procedures:   Paracentesis 8/23: 250 mL aspirated  Antimicrobials:    Subjective: Patient is more confuse, less conversant, didn't recognized daughter.   Objective: Vitals:   01/08/20 1638 01/08/20 2142 01/09/20 0440 01/09/20 0900  BP: (!) 103/58 108/63 (!) 93/51 [place] Kitchen)  101/55  Pulse: 82 85 74 77  Resp: 16 18 18 18   Temp: 98 F (36.7 C) 97.8 F (36.6 C) 98.4 F (36.9 C) 98.2 F (36.8 C)  TempSrc: Oral   Oral  SpO2: 97% 96% 95% 98%  Weight:      Height:        Intake/Output Summary (Last 24 hours) at 01/09/2020 1107 Last data filed at 01/09/2020 0919 Gross per  24 hour  Intake 881.99 ml  Output 850 ml  Net 31.99 ml   Filed Weights   12/16/2019 1403 01/07/20 0145 01/07/20 2037  Weight: 63.5 kg 58.3 kg 58.3 kg    Examination:  General exam: NAD, chronic ill appearing Respiratory system: CTA Cardiovascular system: S 1, S 2 RRR Gastrointestinal system: BS present, soft, nt Central nervous system: lethargic Extremities: no edema    Data Reviewed: I have personally reviewed following labs and imaging studies  CBC: Recent Labs  Lab 01/13/2020 1459 01/07/20 0833 01/08/20 0632 01/09/20 0212  WBC 4.7 3.8* 3.6* 3.4*  NEUTROABS 3.7 2.4 2.4 2.5  HGB 13.4 10.8* 10.7* 10.1*  HCT 38.5 30.3* 30.8* 28.1*  MCV 92.3 89.9 93.3 92.1  PLT 132* 116* 90* 92*   Basic Metabolic Panel: Recent Labs  Lab 12/19/2019 1459 01/07/20 0833 01/08/20 0632 01/09/20 0212  NA 125* 126* 132* 131*  K 4.0 3.5 3.9 3.7  CL 89* 92* 99 95*  CO2 22 20* 15* 23  GLUCOSE 178* 193* 111* 159*  BUN 39* 41* 40* 36*  CREATININE 2.79* 2.74* 2.30* 2.14*  CALCIUM 9.0 8.7* 8.7* 9.0  MG  --  1.3* 2.0 2.0  PHOS  --  3.2 2.7 2.3*   GFR: Estimated Creatinine Clearance: 18.3 mL/min (A) (by C-G formula based on SCr of 2.14 mg/dL (H)). Liver Function Tests: Recent Labs  Lab 01/12/2020 1459 01/07/20 0833 01/08/20 0632 01/09/20 0212  AST 61* 51* 57* 43*  ALT 51* 45* 41 37  ALKPHOS 502* 394* 388* 356*  BILITOT 2.7* 2.4* 2.3* 2.2*  PROT 6.8 5.9* 5.8* 5.9*  ALBUMIN 2.8* 2.5* 2.6* 3.3*   Recent Labs  Lab 01/01/2020 1459  LIPASE 48   Recent Labs  Lab 01/09/2020 1459 01/09/20 0212  AMMONIA 56* 138*   Coagulation Profile: Recent Labs  Lab 01/08/20 1149  INR 2.1*   Cardiac Enzymes: No results for input(s): CKTOTAL, CKMB, CKMBINDEX, TROPONINI in the last 168 hours. BNP (last 3 results) No results for input(s): PROBNP in the last 8760 hours. HbA1C: No results for input(s): HGBA1C in the last 72 hours. CBG: Recent Labs  Lab 01/08/20 0643 01/08/20 1126 01/08/20 1635  01/08/20 2156 01/09/20 0640  GLUCAP 114* 153* 204* 157* 128*   Lipid Profile: No results for input(s): CHOL, HDL, LDLCALC, TRIG, CHOLHDL, LDLDIRECT in the last 72 hours. Thyroid Function Tests: No results for input(s): TSH, T4TOTAL, FREET4, T3FREE, THYROIDAB in the last 72 hours. Anemia Panel: Recent Labs    01/08/20 0632 01/08/20 1149  VITAMINB12 1,190*  --   FOLATE  --  14.8  FERRITIN  --  1,066*  TIBC  --  141*  IRON  --  80  RETICCTPCT 2.3  --    Sepsis Labs: Recent Labs  Lab 01/12/2020 1459 01/07/20 0833 01/07/20 1126 01/08/20 1149 01/09/20 0212  PROCALCITON  --  0.12  --  0.11 0.10  LATICACIDVEN 2.8* 1.7 2.0*  --   --     Recent Results (from the past 240 hour(s))  Culture, body fluid-bottle  Status: None (Preliminary result)   Collection Time: 01/04/2020  5:06 PM   Specimen: Fluid  Result Value Ref Range Status   Specimen Description FLUID PERITONEAL  Final   Special Requests   Final    BOTTLES DRAWN AEROBIC AND ANAEROBIC Blood Culture adequate volume   Culture   Final    NO GROWTH 3 DAYS Performed at [facility], [address], [place], [place] [address]    Report Status PENDING  Incomplete  Gram stain     Status: None   Collection Time: 12/19/2019  5:06 PM   Specimen: Fluid  Result Value Ref Range Status   Specimen Description FLUID PERITONEAL  Final   Special Requests NONE  Final   Gram Stain   Final    RARE WBC PRESENT, PREDOMINANTLY MONONUCLEAR NO ORGANISMS SEEN Performed at [facility], [place] [address] [place], [place] [address]    Report Status 12/31/2019 FINAL  Final  SARS Coronavirus 2 by RT PCR (hospital order, performed in [facility] hospital lab) Nasopharyngeal Nasopharyngeal Swab     Status: None   Collection Time: 12/16/2019  7:13 PM   Specimen: Nasopharyngeal Swab  Result Value Ref Range Status   SARS Coronavirus 2 NEGATIVE NEGATIVE Final    Comment: (NOTE) SARS-CoV-2 target nucleic acids are NOT DETECTED.  The  SARS-CoV-2 RNA is generally detectable in upper and lower respiratory specimens during the acute phase of infection. The lowest concentration of SARS-CoV-2 viral copies this assay can detect is 250 copies / mL. A negative result does not preclude SARS-CoV-2 infection and should not be used as the sole basis for treatment or other patient management decisions.  A negative result may occur with improper specimen collection / handling, submission of specimen other than nasopharyngeal swab, presence of viral mutation(s) within the areas targeted by this assay, and inadequate number of viral copies (<250 copies / mL). A negative result must be combined with clinical observations, patient history, and epidemiological information.  Fact Sheet for Patients:   StrictlyIdeas.no  Fact Sheet for Healthcare Providers: BankingDealers.co.za  This test is not yet approved or  cleared by the [place] FDA and has been authorized for detection and/or diagnosis of SARS-CoV-2 by FDA under an Emergency Use Authorization (EUA).  This EUA will remain in effect (meaning this test can be used) for the duration of the COVID-19 declaration under Section 564(b)(1) of the Act, 21 U.S.C. section 360bbb-3(b)(1), unless the authorization is terminated or revoked sooner.  Performed at [facility], [place] [address] [place], [place] [address]   MRSA PCR Screening     Status: None   Collection Time: 01/07/20  4:16 AM   Specimen: Nasopharyngeal  Result Value Ref Range Status   MRSA by PCR NEGATIVE NEGATIVE Final    Comment:        The GeneXpert MRSA Assay (FDA approved for NASAL specimens only), is one component of a comprehensive MRSA colonization surveillance program. It is not intended to diagnose MRSA infection nor to guide or monitor treatment for MRSA infections. Performed at [facility], [place] [address], [place],  [address]           Radiology Studies: No results found.      Scheduled Meds: . apixaban  5 mg Oral BID  . feeding supplement (ENSURE ENLIVE)  237 mL Oral BID BM  . insulin aspart  0-5 Units Subcutaneous QHS  . insulin aspart  0-9 Units Subcutaneous TID WC  .  insulin glargine  6 Units Subcutaneous QHS  . lactulose  40 g Oral Q6H  . levothyroxine  137 mcg Oral QAC breakfast  . midodrine  10 mg Oral TID WC  . multivitamin with minerals  1 tablet Oral Daily  . octreotide  200 mcg Subcutaneous Q12H  . pantoprazole  40 mg Oral BID  . rifaximin  550 mg Oral BID  . sodium bicarbonate  1,300 mg Oral BID   Continuous Infusions: . albumin human       LOS: 2 days    Time spent:    Elmarie Shiley, MD [facility]   If 7PM-7AM, please contact night-coverage [email or web address]  01/09/2020, 11:07 AM   Minutes.

## 2020-01-09 NOTE — Progress Notes (Signed)
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  Has abd pain and nausea. Family assisting with her care now, planning to continue with rehab placement.  PMHx:  RBBB, gastroentritis, aortic stenosis, CTS, CHF, hx colles fracture, HLD/HTN, L foot drop, macular degeneration, MI, post concussive syndrome, CVA, DM, L humeral fracture, IVC filer, cardiac cath, median nerve repair, hx femur fracture, B TKA    PT Comments    The pt made limited progress towards established PT goals during this session as she was less responsive and did not follow any commands or initiate any voluntary movement at this time. The pt was a totalA to transition to sitting EOB, and was unable to attempt further transfers or mobility due to decreased command following and responsiveness. The pt's family has voiced interest in returning home rather than to a facility to receive therapy, and will therefore need the equipment below to allow for safe mobility and transfers without burdening caregivers. The pt will continue to benefit from skilled PT to attempt to further progress mobility and establish goals of care with the family.     Follow Up Recommendations  Home health PT;Supervision/Assistance - 24 hour (family wanting home with HHPT)     Equipment Recommendations  Wheelchair (measurements PT);Wheelchair cushion (measurements PT);Hospital bed (lift)    Recommendations for Other Services       Precautions / Restrictions Precautions Precautions: Fall Precaution Comments: skin is fragile, easily injured Restrictions Weight Bearing Restrictions: No Other Position/Activity Restrictions: recent paracentesis    Mobility  Bed Mobility Overal bed mobility: Needs Assistance Bed  Mobility: Supine to Sit;Sit to Supine     Supine to sit: Total assist Sit to supine: Total assist   General bed mobility comments: total A to move to and from sitting EOB, pt did not attempt to assist with transition, maxA to maintain sitting EOB  Transfers Overall transfer level:  (not attempted today due to limited pt engagement and attempt to assist)                     Balance Overall balance assessment: Needs assistance Sitting-balance support: Feet supported Sitting balance-Leahy Scale: Zero Sitting balance - Comments: maxA to maintain sitting EOB Postural control: Posterior lean;Right lateral lean   Standing balance-Leahy Scale: Zero Standing balance comment: unable to attempt stand                            Cognition Arousal/Alertness: Awake/alert;Lethargic Behavior During Therapy: Flat affect Overall Cognitive Status: Impaired/Different from baseline Area of Impairment: Awareness;Following commands                       Following Commands: Follows one step commands inconsistently   Awareness: Intellectual   General Comments: pt not responsive to questions or statements through session, not tracking voices or visual input despite presence of daughter. The pt did not attempt any voluntary movement or follow any commands given at this session      Exercises      General Comments General comments (skin integrity, edema, etc.): pt with decreased responsiveness today to all stimuli. The pt did not make any attempt to assist with mobility, transition to sitting EOB, or  to maintain balance in sitting. Daughter present and very supportive.      Pertinent Vitals/Pain Pain Assessment: Faces Faces Pain Scale: Hurts whole lot Pain Location: pt unable to indicate Pain Descriptors / Indicators: Grimacing;Moaning;Crying Pain Intervention(s): Limited activity within patient's tolerance;Monitored during session;Repositioned           PT Goals  (current goals can now be found in the care plan section) Acute Rehab PT Goals Patient Stated Goal: to go to rehab inside hospital PT Goal Formulation: With patient/family Time For Goal Achievement: 01/21/20 Potential to Achieve Goals: Good Progress towards PT goals: Not progressing toward goals - comment (pt less responsive at this time, unable to follow commands or initiate voluntary movement)    Frequency    Min 3X/week      PT Plan Discharge plan needs to be updated       AM-PAC PT "6 Clicks" Mobility   Outcome Measure  Help needed turning from your back to your side while in a flat bed without using bedrails?: A Lot Help needed moving from lying on your back to sitting on the side of a flat bed without using bedrails?: Total Help needed moving to and from a bed to a chair (including a wheelchair)?: Total Help needed standing up from a chair using your arms (e.g., wheelchair or bedside chair)?: Total Help needed to walk in hospital room?: Total Help needed climbing 3-5 steps with a railing? : Total 6 Click Score: 7    End of Session   Activity Tolerance: Patient limited by fatigue Patient left: in bed;with call bell/phone within reach;with bed alarm set;with family/visitor present Nurse Communication: Mobility status PT Visit Diagnosis: Unsteadiness on feet (R26.81);Muscle weakness (generalized) (M62.81);Difficulty in walking, not elsewhere classified (R26.2);Adult, failure to thrive (R62.7)     Time: 1245-8099 PT Time Calculation (min) (ACUTE ONLY): 23 min  Charges:  $Therapeutic Activity: 23-37 mins                     Karma Ganja, PT, DPT   Acute Rehabilitation Department Pager #: [phone]   Otho Bellows 01/09/2020, 5:52 PM

## 2020-01-09 NOTE — Progress Notes (Addendum)
[facility] NEPHROLOGY PROGRESS NOTE  Assessment/ Plan: Pt is a 85 y.o. yo female  with medical history of DM, CAD, DVT on anticoagulation, AS, Nash liver cirrhosis, dementia, CKD who was presented from nursing home per not feeling well including abdominal pain, distention seen as a consultation for the evaluation of AKI on CKD, hyponatremia.  #Acute kidney injury on CKD stage IV likely hemodynamically mediated in addition to chronic hepatorenal syndrome: She is hypotensive and has low serum albumin level. Urine Na <10 and UA bland.  CT scan ruled out obstruction. Started treatment for HRS on 8/25 with albumin, midodrine and octreotide with improvement of creatinine level.  She is nonoliguric.  I will continue the treatment for HRS today and watch for renal recovery. Given advanced liver disease, dementia she is not a candidate for outpatient dialysis.  Noted palliative care was already consulted and patient is DNR.  #Hyponatremia: Related with liver disease with ascites: Treatment of HRS as above.  She will likely need loop diuretics as outpatient.  #Liver cirrhosis/Nash/ascites/coagulopathy: Advanced liver disease.  Given abdomen distention she will likely need paracentesis.  Discussed with the primary team.  #Metabolic acidosis: Serum CO2 level improved, continue sodium bicarbonate, may need to reduce the dose we will follow up tomorrow's lab.  Subjective: Seen and examined at bedside.  She is alert awake but remains confused.  Urine output recorded 850 cc.  Creatinine level improving.  No new event. Objective Vital signs in last 24 hours: Vitals:   01/08/20 1638 01/08/20 2142 01/09/20 0440 01/09/20 0900  BP: (!) 103/58 108/63 (!) 93/51 (!) 101/55  Pulse: 82 85 74 77  Resp: 16 18 18 18   Temp: 98 F (36.7 C) 97.8 F (36.6 C) 98.4 F (36.9 C) 98.2 F (36.8 C)  TempSrc: Oral   Oral  SpO2: 97% 96% 95% 98%  Weight:      Height:       Weight change:   Intake/Output  Summary (Last 24 hours) at 01/09/2020 1059 Last data filed at 01/09/2020 0919 Gross per 24 hour  Intake 881.99 ml  Output 850 ml  Net 31.99 ml       Labs: Basic Metabolic Panel: Recent Labs  Lab 01/07/20 0833 01/08/20 0632 01/09/20 0212  NA 126* 132* 131*  K 3.5 3.9 3.7  CL 92* 99 95*  CO2 20* 15* 23  GLUCOSE 193* 111* 159*  BUN 41* 40* 36*  CREATININE 2.74* 2.30* 2.14*  CALCIUM 8.7* 8.7* 9.0  PHOS 3.2 2.7 2.3*   Liver Function Tests: Recent Labs  Lab 01/07/20 0833 01/08/20 0632 01/09/20 0212  AST 51* 57* 43*  ALT 45* 41 37  ALKPHOS 394* 388* 356*  BILITOT 2.4* 2.3* 2.2*  PROT 5.9* 5.8* 5.9*  ALBUMIN 2.5* 2.6* 3.3*   Recent Labs  Lab 12/23/2019 1459  LIPASE 48   Recent Labs  Lab 12/21/2019 1459 01/09/20 0212  AMMONIA 56* 138*   CBC: Recent Labs  Lab 01/05/2020 1459 01/08/2020 1459 01/07/20 0833 01/08/20 0632 01/09/20 0212  WBC 4.7   < > 3.8* 3.6* 3.4*  NEUTROABS 3.7   < > 2.4 2.4 2.5  HGB 13.4   < > 10.8* 10.7* 10.1*  HCT 38.5   < > 30.3* 30.8* 28.1*  MCV 92.3  --  89.9 93.3 92.1  PLT 132*   < > 116* 90* 92*   < > = values in this interval not displayed.   Cardiac Enzymes: No results for input(s): CKTOTAL, CKMB, CKMBINDEX, TROPONINI  in the last 168 hours. CBG: Recent Labs  Lab 01/08/20 0643 01/08/20 1126 01/08/20 1635 01/08/20 2156 01/09/20 0640  GLUCAP 114* 153* 204* 157* 128*    Iron Studies:  Recent Labs    01/08/20 1149  IRON 80  TIBC 141*  FERRITIN 1,066*   Studies/Results: No results found.  Medications: Infusions: . albumin human      Scheduled Medications: . apixaban  5 mg Oral BID  . feeding supplement (ENSURE ENLIVE)  237 mL Oral BID BM  . insulin aspart  0-5 Units Subcutaneous QHS  . insulin aspart  0-9 Units Subcutaneous TID WC  . insulin glargine  6 Units Subcutaneous QHS  . lactulose  40 g Oral TID  . levothyroxine  137 mcg Oral QAC breakfast  . midodrine  10 mg Oral TID WC  . multivitamin with minerals  1  tablet Oral Daily  . octreotide  200 mcg Subcutaneous Q12H  . pantoprazole  40 mg Oral BID  . sodium bicarbonate  1,300 mg Oral BID    have reviewed scheduled and prn medications.  Physical Exam: General: Elderly ill-looking female lying on bed, confused Heart:RRR, s1s2 nl, no rubs Lungs:clear b/l, no crackle Abdomen:soft, Non-tender, ascites and distended Extremities:No edema Neurology: Alert awake but confused.  Tsering Leaman Prasad Stephine Langbehn 01/09/2020,10:59 AM  LOS: 2 days  Pager: [phone]

## 2020-01-09 NOTE — Procedures (Signed)
PROCEDURE SUMMARY:  Successful US guided paracentesis from right lateral abdomen.  Yielded 2.5 liters of yellow fluid.  No immediate complications.  Pt tolerated well.   Specimen was not sent for labs.  EBL < 61m  KDocia BarrierPA-C 01/09/2020 4:27 PM

## 2020-01-09 NOTE — Progress Notes (Signed)
Hydrologist [facility]) Hospital Liaison: RN note     Notified by [facility] [place] of patient/family request for [facility] services at home after discharge. Spoke with daughter, [name] who is also HCPOA. She requested a call back Friday morning.   Hospital Liaison will follow up with [name] tomorrow morning 8/27 to proceed with referral.  Please call with any hospice related questions.      Thank you for this referral.     Farrel Gordon, RN, [facility] (listed on [place] under [place])   [phone]

## 2020-01-10 LAB — CBC WITH DIFFERENTIAL/PLATELET
Abs Immature Granulocytes: 0.02 10*3/uL (ref 0.00–0.07)
Basophils Absolute: 0 10*3/uL (ref 0.0–0.1)
Basophils Relative: 1 %
Eosinophils Absolute: 0 10*3/uL (ref 0.0–0.5)
Eosinophils Relative: 1 %
HCT: 29.6 % — ABNORMAL LOW (ref 36.0–46.0)
Hemoglobin: 10.4 g/dL — ABNORMAL LOW (ref 12.0–15.0)
Immature Granulocytes: 1 %
Lymphocytes Relative: 18 %
Lymphs Abs: 0.7 10*3/uL (ref 0.7–4.0)
MCH: 32.5 pg (ref 26.0–34.0)
MCHC: 35.1 g/dL (ref 30.0–36.0)
MCV: 92.5 fL (ref 80.0–100.0)
Monocytes Absolute: 0.3 10*3/uL (ref 0.1–1.0)
Monocytes Relative: 7 %
Neutro Abs: 2.7 10*3/uL (ref 1.7–7.7)
Neutrophils Relative %: 72 %
Platelets: 94 10*3/uL — ABNORMAL LOW (ref 150–400)
RBC: 3.2 MIL/uL — ABNORMAL LOW (ref 3.87–5.11)
RDW: 17.4 % — ABNORMAL HIGH (ref 11.5–15.5)
WBC: 3.7 10*3/uL — ABNORMAL LOW (ref 4.0–10.5)
nRBC: 0 % (ref 0.0–0.2)

## 2020-01-10 LAB — COMPREHENSIVE METABOLIC PANEL
ALT: 30 U/L (ref 0–44)
AST: 49 U/L — ABNORMAL HIGH (ref 15–41)
Albumin: 3.8 g/dL (ref 3.5–5.0)
Alkaline Phosphatase: 256 U/L — ABNORMAL HIGH (ref 38–126)
Anion gap: 13 (ref 5–15)
BUN: 30 mg/dL — ABNORMAL HIGH (ref 8–23)
CO2: 21 mmol/L — ABNORMAL LOW (ref 22–32)
Calcium: 9.4 mg/dL (ref 8.9–10.3)
Chloride: 100 mmol/L (ref 98–111)
Creatinine, Ser: 1.93 mg/dL — ABNORMAL HIGH (ref 0.44–1.00)
GFR calc Af Amer: 27 mL/min — ABNORMAL LOW (ref >60)
GFR calc non Af Amer: 24 mL/min — ABNORMAL LOW (ref >60)
Glucose, Bld: 95 mg/dL (ref 70–99)
Potassium: 3.9 mmol/L (ref 3.5–5.1)
Sodium: 134 mmol/L — ABNORMAL LOW (ref 135–145)
Total Bilirubin: 2.5 mg/dL — ABNORMAL HIGH (ref 0.3–1.2)
Total Protein: 5.7 g/dL — ABNORMAL LOW (ref 6.5–8.1)

## 2020-01-10 LAB — MAGNESIUM: Magnesium: 2 mg/dL (ref 1.7–2.4)

## 2020-01-10 LAB — GLUCOSE, CAPILLARY
Glucose-Capillary: 101 mg/dL — ABNORMAL HIGH (ref 70–99)
Glucose-Capillary: 105 mg/dL — ABNORMAL HIGH (ref 70–99)
Glucose-Capillary: 116 mg/dL — ABNORMAL HIGH (ref 70–99)
Glucose-Capillary: 86 mg/dL (ref 70–99)
Glucose-Capillary: 91 mg/dL (ref 70–99)

## 2020-01-10 LAB — PHOSPHORUS: Phosphorus: 2.5 mg/dL (ref 2.5–4.6)

## 2020-01-10 LAB — AMMONIA: Ammonia: 89 umol/L — ABNORMAL HIGH (ref 9–35)

## 2020-01-10 MED ORDER — OXYCODONE HCL 5 MG PO TABS
2.5000 mg | ORAL_TABLET | Freq: Four times a day (QID) | ORAL | Status: DC | PRN
  Administered 2020-01-10: 2.5 mg via ORAL
  Filled 2020-01-10: qty 1

## 2020-01-10 MED ORDER — HYDROMORPHONE HCL 1 MG/ML IJ SOLN
0.5000 mg | INTRAMUSCULAR | Status: DC | PRN
  Administered 2020-01-10 – 2020-01-12 (×4): 0.5 mg via INTRAVENOUS
  Filled 2020-01-10 (×4): qty 1

## 2020-01-10 NOTE — Progress Notes (Signed)
Hydrologist [facility]) Hospital Liaison: RN note     Notified by [facility] [place], RN of patient/family request for [facility] services at home after discharge. Chart and patient information under review by [facility] physician. Hospice eligibility pending currently.     Writer spoke with  Daughters, [name] and [name] to initiate education related to hospice philosophy, services and team approach to care. Daughters verbalized understanding of information given. Per discussion, plan is for discharge to home by  [facility]AR.   Please send signed and completed DNR form home with patient/family. Patient will need prescriptions for discharge comfort medications.      DME needs have been discussed, patient currently has the following equipment in the home: none.  Patient/family requests the following DME for delivery to the home: hospital bed, hoyer lift, W/C and 3N1. [place] equipment manager has been notified and will contact DME provider to arrange delivery to the home. Home address has been verified and is correct in the chart.  [name] is the family member to contact to arrange time of delivery.      [facility] aware of the above. Please notify [facility] when patient is ready to leave the unit at discharge. (Call [phone] or [phone] after 5pm.) [facility] information and contact numbers given to [facility].       Please call with any hospice related questions.     Thank you for this referral.     Farrel Gordon, RN, [facility] (listed on [place] under [place])   [phone]

## 2020-01-10 NOTE — Progress Notes (Signed)
PROGRESS NOTE    GLESSIE EUSTICE  PQD:[id number] DOB: December 06, 1934 DOA: 12/15/2019 PCP: [name], MD   Brief Narrative: 85 year old past medical history significant for dementia, [name], liver cirrhosis, stroke, diabetes type 2, CAD, CHF, history of DVT, aortic stenosis, macular degeneration who presents complaining of abdominal pain.  Patient reports abdominal pain and abdominal distention.  No fever no nausea no vomiting.  She does have a chronic history of diarrhea secondary to medication. In the ED patient underwent paracentesis, without any evidence of SBP.  She was found to have worsening hyponatremia and worsening kidney function.     Assessment & Plan:   Principal Problem:   Hyponatremia Active Problems:   Diabetes mellitus type 2, insulin dependent ([place])   Goals of care, counseling/discussion   DNR (do not resuscitate)   NASH (nonalcoholic steatohepatitis)   Thrombocytopenia (HCC)   Cirrhosis of liver with ascites ([place])   History of DVT (deep vein thrombosis)   Acute kidney injury superimposed on CKD (HCC)   Generalized weakness   Acute metabolic encephalopathy   Dementia without behavioral disturbance (HCC)   Acute renal failure superimposed on stage 4 chronic kidney disease ([place])   Severe protein-calorie malnutrition ([name]: less than 60% of standard weight) ([place])   1-Acute metabolic, hepatic encephalopathy; Also underlying dementia. Ammonia level 56--138----89. B 12: 1190.  Continue with the lactulose Ammonia elevated today at 138. Increase lactulose, add rifaximin.  Plan to continue with lactulose to see if patient mental status improve prior to discharge/.  Plan is for home with hospice.   2-Hyponatremia: Worsening sodium level on admission might be related to hypovolemia dehydration Improved with IV fluids Improved back to baseline.  3-Acute on CKD stage IV: hepatorenal syndrome.  Creatinine baseline 2.1 months ago Might be related to hypovolemia but  also concern with hepatorenal syndrome type I Nephrology consulted Received midodrine, albumin and octreotide. Now only on midodrine.  Improved.   4-Diabetes type 2: Continue with Lantus and sliding scale insulin  NASH/cirrhosis of the liver with ascites -Underwent paracentesis in the ED fluids negative for SBP -Holding spironolactone due to AKI -Follow nephrology recommendation in regards paracentesis to AKI -Meld Score; 28, 19 % estimated 3 month mortality  -palliative care consult for Goals of care. Plan to discharge home with hospice.  -underwent 2.5 L paracentesis 8/26  Thrombocytopenia: Secondary to cirrhosis.  Continue to monitor  Generalized weakness PT will require SNF  Hypomagnesemia: Replete IV  Severe  protein caloric malnutrition: Nutrition consulted.  Liberalize diet  Metabolic acidosis; related to Renal failure.  Nephrology consulted Started on oral bicarb.   Generalized pain;  Plan for oxycodone, and or IV dilaudid PRN  Estimated body mass index is 21.63 kg/m as calculated from the following:   Height as of this encounter: 5' 6"  (1.676 m).   Weight as of this encounter: 60.8 kg.   DVT prophylaxis: Eliquis Code Status: DNR Family Communication: Daughter at bedside.  Disposition Plan:  Status is: Inpatient  Remains inpatient appropriate because:Hemodynamically unstable   Dispo: The patient is from: SNF              Anticipated d/c is to: SNF              Anticipated d/c date is: 2 days              Patient currently is not medically stable to d/c.        Consultants:   Nephrology  Palliative care  Procedures:  Paracentesis 8/23: 250 mL aspirated  Antimicrobials:    Subjective: Still sleepy, appears in pain.   Objective: Vitals:   01/09/20 0900 01/09/20 1657 01/09/20 2039 01/10/20 0531  BP: (!) 101/55 106/63 106/69 (!) 101/49  Pulse: 77 63 65 91  Resp: 18 18 19 16   Temp: 98.2 F (36.8 C) 98.2 F (36.8 C) 98.8 F (37.1  C) (!) 97.5 F (36.4 C)  TempSrc: Oral   Oral  SpO2: 98% 97% 95% 96%  Weight:    60.8 kg  Height:        Intake/Output Summary (Last 24 hours) at 01/10/2020 0937 Last data filed at 01/10/2020 0533 Gross per 24 hour  Intake 454.63 ml  Output 1 ml  Net 453.63 ml   Filed Weights   01/07/20 0145 01/07/20 2037 01/10/20 0531  Weight: 58.3 kg 58.3 kg 60.8 kg    Examination:  General exam: Sleepy, in pain Respiratory system: CTA Cardiovascular system: S 1, S 2 RRR Gastrointestinal system: BS present, soft, nt Central nervous system: sleepy Extremities: no edema    Data Reviewed: I have personally reviewed following labs and imaging studies  CBC: Recent Labs  Lab 01/05/2020 1459 01/07/20 0833 01/08/20 0632 01/09/20 0212 01/10/20 0348  WBC 4.7 3.8* 3.6* 3.4* 3.7*  NEUTROABS 3.7 2.4 2.4 2.5 2.7  HGB 13.4 10.8* 10.7* 10.1* 10.4*  HCT 38.5 30.3* 30.8* 28.1* 29.6*  MCV 92.3 89.9 93.3 92.1 92.5  PLT 132* 116* 90* 92* 94*   Basic Metabolic Panel: Recent Labs  Lab 12/17/2019 1459 01/07/20 0833 01/08/20 0632 01/09/20 0212 01/10/20 0348  NA 125* 126* 132* 131* 134*  K 4.0 3.5 3.9 3.7 3.9  CL 89* 92* 99 95* 100  CO2 22 20* 15* 23 21*  GLUCOSE 178* 193* 111* 159* 95  BUN 39* 41* 40* 36* 30*  CREATININE 2.79* 2.74* 2.30* 2.14* 1.93*  CALCIUM 9.0 8.7* 8.7* 9.0 9.4  MG  --  1.3* 2.0 2.0 2.0  PHOS  --  3.2 2.7 2.3* 2.5   GFR: Estimated Creatinine Clearance: 20.7 mL/min (A) (by C-G formula based on SCr of 1.93 mg/dL (H)). Liver Function Tests: Recent Labs  Lab 12/18/2019 1459 01/07/20 0833 01/08/20 0632 01/09/20 0212 01/10/20 0348  AST 61* 51* 57* 43* 49*  ALT 51* 45* 41 37 30  ALKPHOS 502* 394* 388* 356* 256*  BILITOT 2.7* 2.4* 2.3* 2.2* 2.5*  PROT 6.8 5.9* 5.8* 5.9* 5.7*  ALBUMIN 2.8* 2.5* 2.6* 3.3* 3.8   Recent Labs  Lab 12/21/2019 1459  LIPASE 48   Recent Labs  Lab 12/19/2019 1459 01/09/20 0212  AMMONIA 56* 138*   Coagulation Profile: Recent Labs  Lab  01/08/20 1149  INR 2.1*   Cardiac Enzymes: No results for input(s): CKTOTAL, CKMB, CKMBINDEX, TROPONINI in the last 168 hours. BNP (last 3 results) No results for input(s): PROBNP in the last 8760 hours. HbA1C: No results for input(s): HGBA1C in the last 72 hours. CBG: Recent Labs  Lab 01/09/20 0640 01/09/20 1143 01/09/20 1657 01/10/20 0051 01/10/20 0705  GLUCAP 128* 130* 121* 86 91   Lipid Profile: No results for input(s): CHOL, HDL, LDLCALC, TRIG, CHOLHDL, LDLDIRECT in the last 72 hours. Thyroid Function Tests: No results for input(s): TSH, T4TOTAL, FREET4, T3FREE, THYROIDAB in the last 72 hours. Anemia Panel: Recent Labs    01/08/20 0632 01/08/20 1149  VITAMINB12 1,190*  --   FOLATE  --  14.8  FERRITIN  --  1,066*  TIBC  --  141*  IRON  --  80  RETICCTPCT 2.3  --    Sepsis Labs: Recent Labs  Lab 12/31/2019 1459 01/07/20 0833 01/07/20 1126 01/08/20 1149 01/09/20 0212  PROCALCITON  --  0.12  --  0.11 0.10  LATICACIDVEN 2.8* 1.7 2.0*  --   --     Recent Results (from the past 240 hour(s))  Culture, body fluid-bottle     Status: None (Preliminary result)   Collection Time: 12/24/2019  5:06 PM   Specimen: Fluid  Result Value Ref Range Status   Specimen Description FLUID PERITONEAL  Final   Special Requests   Final    BOTTLES DRAWN AEROBIC AND ANAEROBIC Blood Culture adequate volume   Culture   Final    NO GROWTH 4 DAYS Performed at [facility], [place] [address] [place], [place] [address]    Report Status PENDING  Incomplete  Gram stain     Status: None   Collection Time: 12/17/2019  5:06 PM   Specimen: Fluid  Result Value Ref Range Status   Specimen Description FLUID PERITONEAL  Final   Special Requests NONE  Final   Gram Stain   Final    RARE WBC PRESENT, PREDOMINANTLY MONONUCLEAR NO ORGANISMS SEEN Performed at [facility], [place] [address], [place], [place] [address]    Report Status 12/28/2019 FINAL  Final  SARS Coronavirus 2 by RT PCR  (hospital order, performed in [facility] hospital lab) Nasopharyngeal Nasopharyngeal Swab     Status: None   Collection Time: 12/31/2019  7:13 PM   Specimen: Nasopharyngeal Swab  Result Value Ref Range Status   SARS Coronavirus 2 NEGATIVE NEGATIVE Final    Comment: (NOTE) SARS-CoV-2 target nucleic acids are NOT DETECTED.  The SARS-CoV-2 RNA is generally detectable in upper and lower respiratory specimens during the acute phase of infection. The lowest concentration of SARS-CoV-2 viral copies this assay can detect is 250 copies / mL. A negative result does not preclude SARS-CoV-2 infection and should not be used as the sole basis for treatment or other patient management decisions.  A negative result may occur with improper specimen collection / handling, submission of specimen other than nasopharyngeal swab, presence of viral mutation(s) within the areas targeted by this assay, and inadequate number of viral copies (<250 copies / mL). A negative result must be combined with clinical observations, patient history, and epidemiological information.  Fact Sheet for Patients:   StrictlyIdeas.no  Fact Sheet for Healthcare Providers: BankingDealers.co.za  This test is not yet approved or  cleared by the [place] FDA and has been authorized for detection and/or diagnosis of SARS-CoV-2 by FDA under an Emergency Use Authorization (EUA).  This EUA will remain in effect (meaning this test can be used) for the duration of the COVID-19 declaration under Section 564(b)(1) of the Act, 21 U.S.C. section 360bbb-3(b)(1), unless the authorization is terminated or revoked sooner.  Performed at [facility], [place] [address], [place],  [address]   MRSA PCR Screening     Status: None   Collection Time: 01/07/20  4:16 AM   Specimen: Nasopharyngeal  Result Value Ref Range Status   MRSA by PCR NEGATIVE NEGATIVE Final    Comment:        The  GeneXpert MRSA Assay (FDA approved for NASAL specimens only), is one component of a comprehensive MRSA colonization surveillance program. It is not intended to diagnose MRSA infection nor to guide or monitor treatment for MRSA infections. Performed at [facility], [address] [place], [place] [address]          Radiology Studies: IR Paracentesis  Result Date: 01/09/2020 INDICATION: Patient with history of cirrhosis, recurrent ascites. Request is made for therapeutic paracentesis of up to 2.5 L maximum. EXAM: ULTRASOUND GUIDED THERAPEUTIC PARACENTESIS MEDICATIONS: 10 mL 1% lidocaine COMPLICATIONS: None immediate. PROCEDURE: Informed written consent was obtained from the patient after a discussion of the risks, benefits and alternatives to treatment. A timeout was performed prior to the initiation of the procedure. Initial ultrasound scanning demonstrates a large amount of ascites within the right lower abdominal quadrant. The right lower abdomen was prepped and draped in the usual sterile fashion. 1% lidocaine was used for local anesthesia. Following this, a 19 gauge, 7-cm, Yueh catheter was introduced. An ultrasound image was saved for documentation purposes. The paracentesis was performed. The catheter was removed and a dressing was applied. The patient tolerated the procedure well without immediate post procedural complication. Patient received post-procedure intravenous albumin; see nursing notes for details. FINDINGS: A total of approximately 2.5 liters of yellow fluid was removed. IMPRESSION: Successful ultrasound-guided paracentesis yielding 2.5 liters of peritoneal fluid. Read by: Brynda Greathouse PA-C Electronically Signed   By: Corrie Mckusick D.O.   On: 01/09/2020 16:26        Scheduled Meds: . apixaban  5 mg Oral BID  . feeding supplement (ENSURE ENLIVE)  237 mL Oral BID BM  . insulin aspart  0-5 Units Subcutaneous QHS  . insulin aspart  0-9 Units Subcutaneous TID WC    . insulin glargine  6 Units Subcutaneous QHS  . lactulose  40 g Oral Q6H  . lactulose  300 mL Rectal BID  . levothyroxine  137 mcg Oral QAC breakfast  . midodrine  10 mg Oral TID WC  . multivitamin with minerals  1 tablet Oral Daily  . octreotide  200 mcg Subcutaneous Q12H  . pantoprazole  40 mg Oral BID  . rifaximin  550 mg Oral BID  . sodium bicarbonate  1,300 mg Oral BID   Continuous Infusions:    LOS: 3 days    Time spent:    Elmarie Shiley, MD [facility]   If 7PM-7AM, please contact night-coverage [email or web address]  01/10/2020, 9:37 AM   Minutes.

## 2020-01-10 NOTE — Progress Notes (Signed)
Physical Therapy Treatment Patient Details Name: Tonya Hunter MRN: [id number] DOB: 1935-01-15 Today's Date: 01/10/2020    History of Present Illness 85 yo female was admitted from SNF with hyponatremia, with CKD and ascites from NASH, had paracentesis on 8/23 in ED with no infection found.  Has abd pain and nausea. Family assisting with her care now, planning to continue with rehab placement.  PMHx:  RBBB, gastroentritis, aortic stenosis, CTS, CHF, hx colles fracture, HLD/HTN, L foot drop, macular degeneration, MI, post concussive syndrome, CVA, DM, L humeral fracture, IVC filer, cardiac cath, median nerve repair, hx femur fracture, B TKA    PT Comments    The pt was in bed upon arrival of PT, and was not appropriate for therapy this morning due to significant levels of pain and current goals of hospice care. The pt's family was present and educated on equipment for d/c home as well as technique for PROM to to all extremities to reduce stiffness if the pt's pain is better controlled in the future. The daughter was appreciative and reports she has no other questions at this time.     Follow Up Recommendations  Home health PT;Supervision/Assistance - 24 hour (as desired with home hospice plan of care)     Equipment Recommendations  Wheelchair (measurements PT);Wheelchair cushion (measurements PT);Hospital bed;3in1 (PT) (hoyer lift)    Recommendations for Other Services       Precautions / Restrictions Precautions Precautions: Fall Precaution Comments: skin is fragile, easily injured Restrictions Weight Bearing Restrictions: No Other Position/Activity Restrictions: recent paracentesis    Mobility  Bed Mobility Overal bed mobility:  (not attempted today due to obvious pt discomfort and goals of hospice care, session spent on family education in preparation for d/c home with hospice.)                      Balance Overall balance assessment: Needs assistance                                           Cognition Arousal/Alertness: Lethargic Behavior During Therapy: Flat affect Overall Cognitive Status: Impaired/Different from baseline                                 General Comments: pt with minimal responses to questions or statments (raised her RUE once as the PT was explaining PROM exercises to the pt's daughter) but the pt did not attempt to open eyes or communicate with therapist      Exercises      General Comments General comments (skin integrity, edema, etc.): Pt with marginally improved responsiveness, but session remained limited by significant pain. The pt began crying multiple times through session at the suggestion of mobility, but was reassured by PT and daughter that she was okay at this time and did not have to mobilize. Educated the pt's daughter on equipment and PROM technique      Pertinent Vitals/Pain Pain Assessment: Faces Faces Pain Scale: Hurts whole lot Pain Location: pt unable to indicate Pain Descriptors / Indicators: Grimacing;Moaning;Crying Pain Intervention(s): Limited activity within patient's tolerance;Premedicated before session;Monitored during session           PT Goals (current goals can now be found in the care plan section) Acute Rehab PT Goals Patient Stated Goal: to go home with  family PT Goal Formulation: With patient/family Time For Goal Achievement: 01/21/20 Potential to Achieve Goals: Good Progress towards PT goals: Not progressing toward goals - comment (significantly pain limited)    Frequency    Min 2X/week      PT Plan Current plan remains appropriate    M-PAC PT "6 Clicks" Mobility   Outcome Measure  Help needed turning from your back to your side while in a flat bed without using bedrails?: A Lot Help needed moving from lying on your back to sitting on the side of a flat bed without using bedrails?: Total Help needed moving to and from a bed to a chair  (including a wheelchair)?: Total Help needed standing up from a chair using your arms (e.g., wheelchair or bedside chair)?: Total Help needed to walk in hospital room?: Total Help needed climbing 3-5 steps with a railing? : Total 6 Click Score: 7    End of Session   Activity Tolerance: Patient limited by fatigue;Patient limited by pain Patient left: in bed;with call bell/phone within reach;with bed alarm set;with family/visitor present Nurse Communication: Mobility status PT Visit Diagnosis: Unsteadiness on feet (R26.81);Muscle weakness (generalized) (M62.81);Difficulty in walking, not elsewhere classified (R26.2);Adult, failure to thrive (R62.7)     Time: 3244-0102 PT Time Calculation (min) (ACUTE ONLY): 10 min  Charges:  $Self Care/Home Management: 8-22                     Karma Ganja, PT, DPT   Acute Rehabilitation Department Pager #: [phone]   Otho Bellows 01/10/2020, 11:21 AM

## 2020-01-10 NOTE — Progress Notes (Signed)
[facility] NEPHROLOGY PROGRESS NOTE  Assessment/ Plan: Pt is a 85 y.o. yo female  with medical history of DM, CAD, DVT on anticoagulation, AS, Nash liver cirrhosis, dementia, CKD who was presented from nursing home per not feeling well including abdominal pain, distention seen as a consultation for the evaluation of AKI on CKD, hyponatremia.  #Acute kidney injury on CKD stage IV likely hemodynamically mediated in addition to chronic hepatorenal syndrome: She is hypotensive and has low serum albumin level. Urine Na <10 and UA bland.  CT scan ruled out obstruction. Started treatment for HRS on 8/25 with albumin, midodrine and octreotide with improvement of creatinine level.  She is nonoliguric.  Given advanced liver disease, dementia she is not a candidate for outpatient dialysis. Seen by palliative care service and now plan to discharge with hospice.  We will discontinue albumin and octreotide and plan to continue octreotide.  I have discussed this with the patient's daughter and the primary team.  #Hyponatremia: Related with liver disease with ascites: Treatment of HRS as above.  Serum sodium level improving.  May need loop diuretics for edema.  #Liver cirrhosis/Nash/ascites/coagulopathy: Advanced liver disease.  Status post paracentesis with 2.5 L fluid removal.  Abdomen distention is much better.    #Metabolic acidosis: Serum CO2 level improved, continue sodium bicarbonate.  Patient is going to hospice.  Nothing further to add, plan as above.  I will sign off, please call back with question.  Subjective: Seen and examined at bedside.  Alert awake but confused female.  Her daughter at bedside.  Had paracentesis yesterday with improvement of abdominal distention.  Seen by hospice care. . Objective Vital signs in last 24 hours: Vitals:   01/09/20 0900 01/09/20 1657 01/09/20 2039 01/10/20 0531  BP: (!) 101/55 106/63 106/69 (!) 101/49  Pulse: 77 63 65 91  Resp: 18 18 19 16    Temp: 98.2 F (36.8 C) 98.2 F (36.8 C) 98.8 F (37.1 C) (!) 97.5 F (36.4 C)  TempSrc: Oral   Oral  SpO2: 98% 97% 95% 96%  Weight:    60.8 kg  Height:       Weight change:   Intake/Output Summary (Last 24 hours) at 01/10/2020 1202 Last data filed at 01/10/2020 1148 Gross per 24 hour  Intake 690 ml  Output 601 ml  Net 89 ml       Labs: Basic Metabolic Panel: Recent Labs  Lab 01/08/20 0632 01/09/20 0212 01/10/20 0348  NA 132* 131* 134*  K 3.9 3.7 3.9  CL 99 95* 100  CO2 15* 23 21*  GLUCOSE 111* 159* 95  BUN 40* 36* 30*  CREATININE 2.30* 2.14* 1.93*  CALCIUM 8.7* 9.0 9.4  PHOS 2.7 2.3* 2.5   Liver Function Tests: Recent Labs  Lab 01/08/20 0632 01/09/20 0212 01/10/20 0348  AST 57* 43* 49*  ALT 41 37 30  ALKPHOS 388* 356* 256*  BILITOT 2.3* 2.2* 2.5*  PROT 5.8* 5.9* 5.7*  ALBUMIN 2.6* 3.3* 3.8   Recent Labs  Lab 12/21/2019 1459  LIPASE 48   Recent Labs  Lab 01/07/2020 1459 01/09/20 0212 01/10/20 0859  AMMONIA 56* 138* 89*   CBC: Recent Labs  Lab 01/08/2020 1459 12/20/2019 1459 01/07/20 0833 01/07/20 0833 01/08/20 0632 01/09/20 0212 01/10/20 0348  WBC 4.7   < > 3.8*   < > 3.6* 3.4* 3.7*  NEUTROABS 3.7   < > 2.4   < > 2.4 2.5 2.7  HGB 13.4   < > 10.8*   < >  10.7* 10.1* 10.4*  HCT 38.5   < > 30.3*   < > 30.8* 28.1* 29.6*  MCV 92.3  --  89.9  --  93.3 92.1 92.5  PLT 132*   < > 116*   < > 90* 92* 94*   < > = values in this interval not displayed.   Cardiac Enzymes: No results for input(s): CKTOTAL, CKMB, CKMBINDEX, TROPONINI in the last 168 hours. CBG: Recent Labs  Lab 01/09/20 1143 01/09/20 1657 01/10/20 0051 01/10/20 0705 01/10/20 1113  GLUCAP 130* 121* 86 91 101*    Iron Studies:  Recent Labs    01/08/20 1149  IRON 80  TIBC 141*  FERRITIN 1,066*   Studies/Results: IR Paracentesis  Result Date: 01/09/2020 INDICATION: Patient with history of cirrhosis, recurrent ascites. Request is made for therapeutic paracentesis of up to  2.5 L maximum. EXAM: ULTRASOUND GUIDED THERAPEUTIC PARACENTESIS MEDICATIONS: 10 mL 1% lidocaine COMPLICATIONS: None immediate. PROCEDURE: Informed written consent was obtained from the patient after a discussion of the risks, benefits and alternatives to treatment. A timeout was performed prior to the initiation of the procedure. Initial ultrasound scanning demonstrates a large amount of ascites within the right lower abdominal quadrant. The right lower abdomen was prepped and draped in the usual sterile fashion. 1% lidocaine was used for local anesthesia. Following this, a 19 gauge, 7-cm, Yueh catheter was introduced. An ultrasound image was saved for documentation purposes. The paracentesis was performed. The catheter was removed and a dressing was applied. The patient tolerated the procedure well without immediate post procedural complication. Patient received post-procedure intravenous albumin; see nursing notes for details. FINDINGS: A total of approximately 2.5 liters of yellow fluid was removed. IMPRESSION: Successful ultrasound-guided paracentesis yielding 2.5 liters of peritoneal fluid. Read by: Brynda Greathouse PA-C Electronically Signed   By: Corrie Mckusick D.O.   On: 01/09/2020 16:26    Medications: Infusions:   Scheduled Medications: . apixaban  5 mg Oral BID  . feeding supplement (ENSURE ENLIVE)  237 mL Oral BID BM  . insulin aspart  0-5 Units Subcutaneous QHS  . insulin aspart  0-9 Units Subcutaneous TID WC  . insulin glargine  6 Units Subcutaneous QHS  . lactulose  40 g Oral Q6H  . lactulose  300 mL Rectal BID  . levothyroxine  137 mcg Oral QAC breakfast  . midodrine  10 mg Oral TID WC  . multivitamin with minerals  1 tablet Oral Daily  . octreotide  200 mcg Subcutaneous Q12H  . pantoprazole  40 mg Oral BID  . rifaximin  550 mg Oral BID  . sodium bicarbonate  1,300 mg Oral BID    have reviewed scheduled and prn medications.  Physical Exam: General: Elderly ill-looking female  lying on bed, confused Heart:RRR, s1s2 nl, no rubs Lungs:clear b/l, no crackle Abdomen:soft, Non-tender, ascites and abdomen distention has improved. Extremities:No edema Neurology: Alert awake but confused.  Tonya Hunter Tanna Furry 01/10/2020,12:02 PM  LOS: 3 days  Pager: [phone]

## 2020-01-10 NOTE — Progress Notes (Signed)
Chaplain responded to Spiritual Consult to read scripture and pray with patient.  Chaplain unable to rouse patient.  That notwithstanding, Chaplain read scripture and prayed at bedside.  Pt's daughter entered the room during prayer time.  Chaplain ministered to daughter.  Advised daughter that if her mom would benefit from additional scripture reading and prayer to ask nurse to place another consult and Chaplain will answer the call.  [place]

## 2020-01-10 NOTE — TOC Progression Note (Signed)
Transition of Care [facility]) - Progression Note    Patient Details  Name: Tonya Hunter MRN: [id number] Date of Birth: 03-22-1935  Transition [facility]) CM/SW Contact  Bartholomew Crews, RN Phone Number: [phone] 01/10/2020, 5:37 PM  Clinical Narrative:     Contacted by nursing and daughter, [name], at bedside. [name] stated that [employer] has high demand for deliveries and is unable to deliver before Monday. MD notified. [place] notified. [facility] to follow up with DME delivery. TOC following for transition needs.   Expected Discharge Plan: [place] (Return to [facility]) Barriers to Discharge: Continued Medical Work up  Expected Discharge Plan and Services Expected Discharge Plan: [place] (Return to [facility]) In-house Referral: Clinical Social Work     Living arrangements for the past 2 months: [place]                                       Social Determinants of Health (SDOH) Interventions    Readmission Risk Interventions Readmission Risk Prevention Plan 12/06/2019 09/16/2019  Transportation Screening Complete Complete  PCP or Specialist Appt within 5-7 Days - Complete  PCP or Specialist Appt within 3-5 Days Complete -  Home Care Screening - Complete  Medication Review (RN CM) - Complete  HRI or Home Care Consult Complete -  Social Work Consult for Recovery Care Planning/Counseling Complete -  Palliative Care Screening Complete -  Medication Review Press photographer) Complete -  Some recent data might be hidden

## 2020-01-11 LAB — COMPREHENSIVE METABOLIC PANEL
ALT: 35 U/L (ref 0–44)
AST: 47 U/L — ABNORMAL HIGH (ref 15–41)
Albumin: 3.3 g/dL — ABNORMAL LOW (ref 3.5–5.0)
Alkaline Phosphatase: 238 U/L — ABNORMAL HIGH (ref 38–126)
Anion gap: 12 (ref 5–15)
BUN: 30 mg/dL — ABNORMAL HIGH (ref 8–23)
CO2: 25 mmol/L (ref 22–32)
Calcium: 9.4 mg/dL (ref 8.9–10.3)
Chloride: 101 mmol/L (ref 98–111)
Creatinine, Ser: 1.87 mg/dL — ABNORMAL HIGH (ref 0.44–1.00)
GFR calc Af Amer: 28 mL/min — ABNORMAL LOW (ref >60)
GFR calc non Af Amer: 24 mL/min — ABNORMAL LOW (ref >60)
Glucose, Bld: 100 mg/dL — ABNORMAL HIGH (ref 70–99)
Potassium: 3.7 mmol/L (ref 3.5–5.1)
Sodium: 138 mmol/L (ref 135–145)
Total Bilirubin: 2.9 mg/dL — ABNORMAL HIGH (ref 0.3–1.2)
Total Protein: 5.5 g/dL — ABNORMAL LOW (ref 6.5–8.1)

## 2020-01-11 LAB — CBC WITH DIFFERENTIAL/PLATELET
Abs Immature Granulocytes: 0.03 10*3/uL (ref 0.00–0.07)
Basophils Absolute: 0 10*3/uL (ref 0.0–0.1)
Basophils Relative: 0 %
Eosinophils Absolute: 0 10*3/uL (ref 0.0–0.5)
Eosinophils Relative: 1 %
HCT: 29.3 % — ABNORMAL LOW (ref 36.0–46.0)
Hemoglobin: 9.9 g/dL — ABNORMAL LOW (ref 12.0–15.0)
Immature Granulocytes: 1 %
Lymphocytes Relative: 20 %
Lymphs Abs: 0.9 10*3/uL (ref 0.7–4.0)
MCH: 31.7 pg (ref 26.0–34.0)
MCHC: 33.8 g/dL (ref 30.0–36.0)
MCV: 93.9 fL (ref 80.0–100.0)
Monocytes Absolute: 0.4 10*3/uL (ref 0.1–1.0)
Monocytes Relative: 9 %
Neutro Abs: 3.1 10*3/uL (ref 1.7–7.7)
Neutrophils Relative %: 69 %
Platelets: 104 10*3/uL — ABNORMAL LOW (ref 150–400)
RBC: 3.12 MIL/uL — ABNORMAL LOW (ref 3.87–5.11)
RDW: 18.1 % — ABNORMAL HIGH (ref 11.5–15.5)
WBC: 4.5 10*3/uL (ref 4.0–10.5)
nRBC: 0 % (ref 0.0–0.2)

## 2020-01-11 LAB — CULTURE, BODY FLUID W GRAM STAIN -BOTTLE
Culture: NO GROWTH
Special Requests: ADEQUATE

## 2020-01-11 LAB — GLUCOSE, CAPILLARY
Glucose-Capillary: 77 mg/dL (ref 70–99)
Glucose-Capillary: 66 mg/dL — ABNORMAL LOW (ref 70–99)
Glucose-Capillary: 158 mg/dL — ABNORMAL HIGH (ref 70–99)
Glucose-Capillary: 198 mg/dL — ABNORMAL HIGH (ref 70–99)
Glucose-Capillary: 214 mg/dL — ABNORMAL HIGH (ref 70–99)

## 2020-01-11 LAB — MAGNESIUM: Magnesium: 1.9 mg/dL (ref 1.7–2.4)

## 2020-01-11 LAB — PHOSPHORUS: Phosphorus: 2.9 mg/dL (ref 2.5–4.6)

## 2020-01-11 MED ORDER — DEXTROSE 50 % IV SOLN
INTRAVENOUS | Status: AC
  Filled 2020-01-11: qty 50

## 2020-01-11 NOTE — Progress Notes (Signed)
PROGRESS NOTE    AVIAH SORCI  TIR:[id number] DOB: 05/13/1935 DOA: 12/20/2019 PCP: [name], MD   Brief Narrative: 85 year old past medical history significant for dementia, [name], liver cirrhosis, stroke, diabetes type 2, CAD, CHF, history of DVT, aortic stenosis, macular degeneration who presents complaining of abdominal pain.  Patient reports abdominal pain and abdominal distention.  No fever no nausea no vomiting.  She does have a chronic history of diarrhea secondary to medication. In the ED patient underwent paracentesis, without any evidence of SBP.  She was found to have worsening hyponatremia and worsening kidney function.     Assessment & Plan:   Principal Problem:   Hyponatremia Active Problems:   Diabetes mellitus type 2, insulin dependent ([place])   Goals of care, counseling/discussion   DNR (do not resuscitate)   NASH (nonalcoholic steatohepatitis)   Thrombocytopenia (HCC)   Cirrhosis of liver with ascites ([place])   History of DVT (deep vein thrombosis)   Acute kidney injury superimposed on CKD (HCC)   Generalized weakness   Acute metabolic encephalopathy   Dementia without behavioral disturbance (HCC)   Acute renal failure superimposed on stage 4 chronic kidney disease ([place])   Severe protein-calorie malnutrition ([name]: less than 60% of standard weight) ([place])   1-Acute metabolic, hepatic encephalopathy; Also underlying dementia. Ammonia level 56--138----89. B 12: 1190.  Continue with the lactulose Ammonia elevated today at 138. Increase lactulose, add rifaximin.  Plan to continue with lactulose to see if patient mental status improve prior to discharge/.  Plan is for home with hospice.  More sleepy today. Received dilaudid last night.  We are trying to get the retention enema. Discussed with family we are not going to proceed with NG tube placement.  Ok for lactulose enema. Family wants to make sure patient is comfortable.    2-Hyponatremia: Worsening  sodium level on admission might be related to hypovolemia dehydration Improved with IV fluids Improved back to baseline.  3-Acute on CKD stage IV: hepatorenal syndrome.  Creatinine baseline 2.1 months ago Might be related to hypovolemia but also concern with hepatorenal syndrome type I Nephrology consulted Received midodrine, albumin and octreotide. Now only on midodrine.  Improved.   4-Diabetes type 2: Continue with Lantus and sliding scale insulin  NASH/cirrhosis of the liver with ascites -Underwent paracentesis in the ED fluids negative for SBP -Holding spironolactone due to AKI -Follow nephrology recommendation in regards paracentesis to AKI -Meld Score; 28, 19 % estimated 3 month mortality  -palliative care consult for Goals of care. Plan to discharge home with hospice.  -underwent 2.5 L paracentesis 8/26  Thrombocytopenia: Secondary to cirrhosis.  Continue to monitor  Generalized weakness PT will require SNF  Hypomagnesemia: Replete IV  Severe  protein caloric malnutrition: Nutrition consulted.  Liberalize diet  Metabolic acidosis; related to Renal failure.  Nephrology consulted Started on oral bicarb. Resolved. Hold oral bicarb.   Generalized pain;  Plan for oxycodone, and or IV dilaudid PRN  Estimated body mass index is 21.63 kg/m as calculated from the following:   Height as of this encounter: 5' 6"  (1.676 m).   Weight as of this encounter: 60.8 kg.   DVT prophylaxis: Eliquis Code Status: DNR Family Communication: Daughter POA, over phone.  Disposition Plan:  Status is: Inpatient  Remains inpatient appropriate because:Hemodynamically unstable   Dispo: The patient is from: SNF              Anticipated d/c is to: Home  Anticipated d/c date is: 2 days              Patient currently is not medically stable to d/c.Home with hospice on Monday         Consultants:   Nephrology  Palliative care  Procedures:   Paracentesis 8/23: 250  mL aspirated  Antimicrobials:    Subjective: Sleepy, does not appears in pain today. Appears resting comfortable.   Objective: Vitals:   01/10/20 0531 01/10/20 1628 01/10/20 2149 01/11/20 1103  BP: (!) 101/49 123/64 108/84 102/63  Pulse: 91 83 91 91  Resp: 16 18 15 16   Temp: (!) 97.5 F (36.4 C)  98.4 F (36.9 C) 98.4 F (36.9 C)  TempSrc: Oral   Oral  SpO2: 96% 98% 97% 100%  Weight: 60.8 kg     Height:        Intake/Output Summary (Last 24 hours) at 01/11/2020 1202 Last data filed at 01/11/2020 1000 Gross per 24 hour  Intake 0 ml  Output 650 ml  Net -650 ml   Filed Weights   01/07/20 0145 01/07/20 2037 01/10/20 0531  Weight: 58.3 kg 58.3 kg 60.8 kg    Examination:  General exam: NAD, Sleepy Respiratory system: CTA Cardiovascular system: S 1, S 2 RRR Gastrointestinal system: BS present, soft, nt Central nervous system: Sleepy Extremities: no edema    Data Reviewed: I have personally reviewed following labs and imaging studies  CBC: Recent Labs  Lab 01/07/20 0833 01/08/20 0632 01/09/20 0212 01/10/20 0348 01/11/20 0332  WBC 3.8* 3.6* 3.4* 3.7* 4.5  NEUTROABS 2.4 2.4 2.5 2.7 3.1  HGB 10.8* 10.7* 10.1* 10.4* 9.9*  HCT 30.3* 30.8* 28.1* 29.6* 29.3*  MCV 89.9 93.3 92.1 92.5 93.9  PLT 116* 90* 92* 94* 272*   Basic Metabolic Panel: Recent Labs  Lab 01/07/20 0833 01/08/20 0632 01/09/20 0212 01/10/20 0348 01/11/20 0332  NA 126* 132* 131* 134* 138  K 3.5 3.9 3.7 3.9 3.7  CL 92* 99 95* 100 101  CO2 20* 15* 23 21* 25  GLUCOSE 193* 111* 159* 95 100*  BUN 41* 40* 36* 30* 30*  CREATININE 2.74* 2.30* 2.14* 1.93* 1.87*  CALCIUM 8.7* 8.7* 9.0 9.4 9.4  MG 1.3* 2.0 2.0 2.0 1.9  PHOS 3.2 2.7 2.3* 2.5 2.9   GFR: Estimated Creatinine Clearance: 21.3 mL/min (A) (by C-G formula based on SCr of 1.87 mg/dL (H)). Liver Function Tests: Recent Labs  Lab 01/07/20 0833 01/08/20 0632 01/09/20 0212 01/10/20 0348 01/11/20 0332  AST 51* 57* 43* 49* 47*  ALT 45*  41 37 30 35  ALKPHOS 394* 388* 356* 256* 238*  BILITOT 2.4* 2.3* 2.2* 2.5* 2.9*  PROT 5.9* 5.8* 5.9* 5.7* 5.5*  ALBUMIN 2.5* 2.6* 3.3* 3.8 3.3*   Recent Labs  Lab 01/11/2020 1459  LIPASE 48   Recent Labs  Lab 12/27/2019 1459 01/09/20 0212 01/10/20 0859  AMMONIA 56* 138* 89*   Coagulation Profile: Recent Labs  Lab 01/08/20 1149  INR 2.1*   Cardiac Enzymes: No results for input(s): CKTOTAL, CKMB, CKMBINDEX, TROPONINI in the last 168 hours. BNP (last 3 results) No results for input(s): PROBNP in the last 8760 hours. HbA1C: No results for input(s): HGBA1C in the last 72 hours. CBG: Recent Labs  Lab 01/10/20 1113 01/10/20 1625 01/10/20 2149 01/11/20 0657 01/11/20 1130  GLUCAP 101* 105* 116* 77 66*   Lipid Profile: No results for input(s): CHOL, HDL, LDLCALC, TRIG, CHOLHDL, LDLDIRECT in the last 72 hours. Thyroid Function Tests: No results  for input(s): TSH, T4TOTAL, FREET4, T3FREE, THYROIDAB in the last 72 hours. Anemia Panel: No results for input(s): VITAMINB12, FOLATE, FERRITIN, TIBC, IRON, RETICCTPCT in the last 72 hours. Sepsis Labs: Recent Labs  Lab 12/21/2019 1459 01/07/20 0833 01/07/20 1126 01/08/20 1149 01/09/20 0212  PROCALCITON  --  0.12  --  0.11 0.10  LATICACIDVEN 2.8* 1.7 2.0*  --   --     Recent Results (from the past 240 hour(s))  Culture, body fluid-bottle     Status: None   Collection Time: 01/11/2020  5:06 PM   Specimen: Fluid  Result Value Ref Range Status   Specimen Description FLUID PERITONEAL  Final   Special Requests   Final    BOTTLES DRAWN AEROBIC AND ANAEROBIC Blood Culture adequate volume   Culture   Final    NO GROWTH 5 DAYS Performed at [facility], [address], [place], [place] [address]    Report Status 01/11/2020 FINAL  Final  Gram stain     Status: None   Collection Time: 12/25/2019  5:06 PM   Specimen: Fluid  Result Value Ref Range Status   Specimen Description FLUID PERITONEAL  Final   Special Requests NONE   Final   Gram Stain   Final    RARE WBC PRESENT, PREDOMINANTLY MONONUCLEAR NO ORGANISMS SEEN Performed at [facility], [place] [address], [place], [place] [address]    Report Status 12/20/2019 FINAL  Final  SARS Coronavirus 2 by RT PCR (hospital order, performed in [facility] hospital lab) Nasopharyngeal Nasopharyngeal Swab     Status: None   Collection Time: 12/21/2019  7:13 PM   Specimen: Nasopharyngeal Swab  Result Value Ref Range Status   SARS Coronavirus 2 NEGATIVE NEGATIVE Final    Comment: (NOTE) SARS-CoV-2 target nucleic acids are NOT DETECTED.  The SARS-CoV-2 RNA is generally detectable in upper and lower respiratory specimens during the acute phase of infection. The lowest concentration of SARS-CoV-2 viral copies this assay can detect is 250 copies / mL. A negative result does not preclude SARS-CoV-2 infection and should not be used as the sole basis for treatment or other patient management decisions.  A negative result may occur with improper specimen collection / handling, submission of specimen other than nasopharyngeal swab, presence of viral mutation(s) within the areas targeted by this assay, and inadequate number of viral copies (<250 copies / mL). A negative result must be combined with clinical observations, patient history, and epidemiological information.  Fact Sheet for Patients:   StrictlyIdeas.no  Fact Sheet for Healthcare Providers: BankingDealers.co.za  This test is not yet approved or  cleared by the [place] FDA and has been authorized for detection and/or diagnosis of SARS-CoV-2 by FDA under an Emergency Use Authorization (EUA).  This EUA will remain in effect (meaning this test can be used) for the duration of the COVID-19 declaration under Section 564(b)(1) of the Act, 21 U.S.C. section 360bbb-3(b)(1), unless the authorization is terminated or revoked sooner.  Performed at [facility], [place] [address], [place], [place] [address]   MRSA PCR Screening     Status: None   Collection Time: 01/07/20  4:16 AM   Specimen: Nasopharyngeal  Result Value Ref Range Status   MRSA by PCR NEGATIVE NEGATIVE Final    Comment:        The GeneXpert MRSA Assay (FDA approved for NASAL specimens only), is one component of a comprehensive MRSA colonization surveillance program. It is not intended to  diagnose MRSA infection nor to guide or monitor treatment for MRSA infections. Performed at [facility], [place] [address] [place], [place] [address]          Radiology Studies: IR Paracentesis  Result Date: 01/09/2020 INDICATION: Patient with history of cirrhosis, recurrent ascites. Request is made for therapeutic paracentesis of up to 2.5 L maximum. EXAM: ULTRASOUND GUIDED THERAPEUTIC PARACENTESIS MEDICATIONS: 10 mL 1% lidocaine COMPLICATIONS: None immediate. PROCEDURE: Informed written consent was obtained from the patient after a discussion of the risks, benefits and alternatives to treatment. A timeout was performed prior to the initiation of the procedure. Initial ultrasound scanning demonstrates a large amount of ascites within the right lower abdominal quadrant. The right lower abdomen was prepped and draped in the usual sterile fashion. 1% lidocaine was used for local anesthesia. Following this, a 19 gauge, 7-cm, Yueh catheter was introduced. An ultrasound image was saved for documentation purposes. The paracentesis was performed. The catheter was removed and a dressing was applied. The patient tolerated the procedure well without immediate post procedural complication. Patient received post-procedure intravenous albumin; see nursing notes for details. FINDINGS: A total of approximately 2.5 liters of yellow fluid was removed. IMPRESSION: Successful ultrasound-guided paracentesis yielding 2.5 liters of peritoneal fluid. Read by: Brynda Greathouse PA-C Electronically Signed   By: Corrie Mckusick D.O.   On: 01/09/2020 16:26        Scheduled Meds: . apixaban  5 mg Oral BID  . feeding supplement (ENSURE ENLIVE)  237 mL Oral BID BM  . insulin aspart  0-5 Units Subcutaneous QHS  . insulin aspart  0-9 Units Subcutaneous TID WC  . insulin glargine  6 Units Subcutaneous QHS  . lactulose  40 g Oral Q6H  . lactulose  300 mL Rectal BID  . levothyroxine  137 mcg Oral QAC breakfast  . midodrine  10 mg Oral TID WC  . multivitamin with minerals  1 tablet Oral Daily  . pantoprazole  40 mg Oral BID  . rifaximin  550 mg Oral BID  . sodium bicarbonate  1,300 mg Oral BID   Continuous Infusions:    LOS: 4 days    Time spent:    Elmarie Shiley, MD [facility]   If 7PM-7AM, please contact night-coverage [email or web address]  01/11/2020, 12:02 PM   Minutes.

## 2020-01-11 NOTE — Progress Notes (Signed)
Manufacturing engineer ([place]  DME was delivered to home at [place], where pt will d/c to on Monday.  Per family, there is not anyone who will be at the home to accept her until Monday.   Hospice will plan to admit to the home on Monday.  Venia Carbon RN, BSN, [facility] Liaison

## 2020-01-11 NOTE — Progress Notes (Signed)
°   01/11/20 0234  Provider Notification  Provider Name/Title Dr. Myna Hidalgo  Date Provider Notified 01/11/20  Time Provider Notified [phone]  Notification Type Page  Notification Reason Other (Comment) (Unable to do Lactulose Enema 2nd no retention kit.)  Response No new orders (No new orders - make patient comfortable)  Date of Provider Response 01/11/20   Unable to do lactulose enema secondary to the correct retention enema set not currently in stock.  Several units called and they did not have either.  Dr. Myna Hidalgo made aware.  Patient only moans and states "Help me".  Will not follow commands.  HS medications not attempted d/t patient not alert enough to attempt.  0.5 mg IV PRN Dilaudid given and patient is currently resting peacefully.  Will continue to monitor patient.  Earleen Reaper RN

## 2020-01-12 ENCOUNTER — Inpatient Hospital Stay (HOSPITAL_COMMUNITY): Payer: Medicare Other

## 2020-01-12 DIAGNOSIS — R109 Unspecified abdominal pain: Secondary | ICD-10-CM

## 2020-01-12 DIAGNOSIS — Z515 Encounter for palliative care: Secondary | ICD-10-CM

## 2020-01-12 LAB — BASIC METABOLIC PANEL
Anion gap: 9 (ref 5–15)
BUN: 29 mg/dL — ABNORMAL HIGH (ref 8–23)
CO2: 24 mmol/L (ref 22–32)
Calcium: 9.4 mg/dL (ref 8.9–10.3)
Chloride: 103 mmol/L (ref 98–111)
Creatinine, Ser: 1.83 mg/dL — ABNORMAL HIGH (ref 0.44–1.00)
GFR calc Af Amer: 29 mL/min — ABNORMAL LOW (ref >60)
GFR calc non Af Amer: 25 mL/min — ABNORMAL LOW (ref >60)
Glucose, Bld: 153 mg/dL — ABNORMAL HIGH (ref 70–99)
Potassium: 3.6 mmol/L (ref 3.5–5.1)
Sodium: 136 mmol/L (ref 135–145)

## 2020-01-12 LAB — GLUCOSE, CAPILLARY: Glucose-Capillary: 138 mg/dL — ABNORMAL HIGH (ref 70–99)

## 2020-01-12 MED ORDER — SODIUM CHLORIDE 0.9 % IV SOLN
1.0000 mg/h | INTRAVENOUS | Status: DC
  Administered 2020-01-12: 0.5 mg/h via INTRAVENOUS
  Administered 2020-01-12: 1 mg/h via INTRAVENOUS
  Administered 2020-01-13 – 2020-01-14 (×2): 1.5 mg/h via INTRAVENOUS
  Filled 2020-01-12 (×3): qty 5
  Filled 2020-01-12: qty 2.5

## 2020-01-12 MED ORDER — BIOTENE DRY MOUTH MT LIQD: 15.0000 mL | OROMUCOSAL | Status: DC | PRN

## 2020-01-12 MED ORDER — BISACODYL 10 MG RE SUPP: 10.0000 mg | Freq: Once | RECTAL | Status: DC

## 2020-01-12 MED ORDER — GLYCOPYRROLATE 0.2 MG/ML IJ SOLN: 0.2000 mg | INTRAMUSCULAR | Status: DC | PRN

## 2020-01-12 MED ORDER — ONDANSETRON HCL 4 MG/2ML IJ SOLN: 4.0000 mg | Freq: Four times a day (QID) | INTRAMUSCULAR | Status: DC | PRN

## 2020-01-12 MED ORDER — FLEET ENEMA 7-19 GM/118ML RE ENEM
1.0000 | ENEMA | Freq: Once | RECTAL | Status: DC
  Filled 2020-01-12: qty 1

## 2020-01-12 MED ORDER — ACETAMINOPHEN 650 MG RE SUPP: 650.0000 mg | Freq: Four times a day (QID) | RECTAL | Status: DC | PRN

## 2020-01-12 MED ORDER — GLYCOPYRROLATE 1 MG PO TABS
1.0000 mg | ORAL_TABLET | ORAL | Status: DC | PRN
  Filled 2020-01-12: qty 1

## 2020-01-12 MED ORDER — ACETAMINOPHEN 325 MG PO TABS: 650.0000 mg | ORAL_TABLET | Freq: Four times a day (QID) | ORAL | Status: DC | PRN

## 2020-01-12 MED ORDER — HYDROMORPHONE BOLUS VIA INFUSION
1.0000 mg | INTRAVENOUS | Status: DC | PRN
  Administered 2020-01-13: 1 mg via INTRAVENOUS
  Filled 2020-01-12: qty 1

## 2020-01-12 MED ORDER — HALOPERIDOL LACTATE 5 MG/ML IJ SOLN: 0.5000 mg | INTRAMUSCULAR | Status: DC | PRN

## 2020-01-12 MED ORDER — HYDROMORPHONE HCL 1 MG/ML IJ SOLN
0.5000 mg | INTRAMUSCULAR | Status: DC | PRN
  Administered 2020-01-12: 0.5 mg via INTRAVENOUS
  Filled 2020-01-12: qty 1

## 2020-01-12 MED ORDER — POLYVINYL ALCOHOL 1.4 % OP SOLN
1.0000 [drp] | Freq: Four times a day (QID) | OPHTHALMIC | Status: DC | PRN
  Filled 2020-01-12: qty 15

## 2020-01-12 MED ORDER — HYDROMORPHONE BOLUS VIA INFUSION
0.5000 mg | INTRAVENOUS | Status: DC | PRN
  Administered 2020-01-12 (×2): 0.5 mg via INTRAVENOUS
  Filled 2020-01-12: qty 1

## 2020-01-12 MED ORDER — ONDANSETRON 4 MG PO TBDP: 4.0000 mg | ORAL_TABLET | Freq: Four times a day (QID) | ORAL | Status: DC | PRN

## 2020-01-12 MED ORDER — HALOPERIDOL LACTATE 2 MG/ML PO CONC
0.5000 mg | ORAL | Status: DC | PRN
  Filled 2020-01-12: qty 0.3

## 2020-01-12 MED ORDER — HALOPERIDOL 0.5 MG PO TABS
0.5000 mg | ORAL_TABLET | ORAL | Status: DC | PRN
  Filled 2020-01-12: qty 1

## 2020-01-12 NOTE — Progress Notes (Signed)
PROGRESS NOTE    Tonya Hunter  RCV:[id number] DOB: 02-Mar-1935 DOA: 12/16/2019 PCP: [name], MD   Brief Narrative: 85 year old past medical history significant for dementia, Tonya Hunter, liver cirrhosis, stroke, diabetes type 2, CAD, CHF, history of DVT, aortic stenosis, macular degeneration who presents complaining of abdominal pain.  Patient reports abdominal pain and abdominal distention.  No fever no nausea no vomiting.  She does have a chronic history of diarrhea secondary to medication. In the ED patient underwent paracentesis, without any evidence of SBP.  She was found to have worsening hyponatremia and worsening kidney function.     Assessment & Plan:   Principal Problem:   Hyponatremia Active Problems:   Diabetes mellitus type 2, insulin dependent ([place])   Goals of care, counseling/discussion   DNR (do not resuscitate)   NASH (nonalcoholic steatohepatitis)   Thrombocytopenia (HCC)   Cirrhosis of liver with ascites ([place])   History of DVT (deep vein thrombosis)   Acute kidney injury superimposed on CKD (HCC)   Generalized weakness   Acute metabolic encephalopathy   Dementia without behavioral disturbance (HCC)   Acute renal failure superimposed on stage 4 chronic kidney disease (HCC)   Severe protein-calorie malnutrition ([name]: less than 60% of standard weight) (HCC)   Abdominal pain   Comfort measures only status   Palliative care encounter   1-Acute metabolic, hepatic encephalopathy; Also underlying dementia. Patient received lactulose, enema and oral during hospitalization.  Ammonia level 56--138----89. B 12: 1190.  She continue to decline, poor oral intake.  Overnight , she probably aspirated. She has decline further and appears to be in end of life process.  Family at bedside, support offered. Transition to full comfort care.   2-Hyponatremia: Worsening sodium level on admission might be related to hypovolemia dehydration Improved with IV  fluids   3-Acute on CKD stage IV: hepatorenal syndrome.  Creatinine baseline 2.1 months ago Might be related to hypovolemia but also concern with hepatorenal syndrome type I Nephrology consulted Received midodrine, albumin and octreotide. Now only on midodrine.    4-Diabetes type 2: Continue with Lantus and sliding scale insulin  NASH/cirrhosis of the liver with ascites -Underwent paracentesis in the ED fluids negative for SBP -Holding spironolactone due to AKI -Follow nephrology recommendation in regards paracentesis to AKI -Meld Score; 28, 19 % estimated 3 month mortality  -palliative care consult for Goals of care. Plan to discharge home with hospice.  -underwent 2.5 L paracentesis 8/26  Thrombocytopenia: Secondary to cirrhosis.  Continue to monitor  Generalized weakness PT will require SNF  Hypomagnesemia: Rep[laced  Severe  protein caloric malnutrition: Nutrition consulted.  Liberalize diet  Metabolic acidosis; related to Renal failure.  Nephrology consulted Started on oral bicarb. Resolved. Hold oral bicarb.   Generalized pain;  Plan for oxycodone, and or IV dilaudid PRN  Estimated body mass index is 20.71 kg/m as calculated from the following:   Height as of this encounter: 5' 6"  (1.676 m).   Weight as of this encounter: 58.2 kg.   DVT prophylaxis: Eliquis Code Status: DNR Family Communication: Daughter at bedside Disposition Plan:  Status is: Inpatient  Remains inpatient appropriate because:Hemodynamically unstable   Dispo: The patient is from: SNF              Anticipated d/c is to: Home              Anticipated d/c date is: 2 days              Patient currently is  not medically stable to d/c.Home with hospice on Monday         Consultants:   Nephrology  Palliative care  Procedures:   Paracentesis 8/23: 250 mL aspirated  Antimicrobials:    Subjective: Events from last night notice. Patient more lethargic, period of apnea notice.    Objective: Vitals:   01/11/20 1103 01/11/20 1725 01/11/20 2000 01/12/20 0205  BP: 102/63 117/69 115/75 121/74  Pulse: 91 92 92 (!) 103  Resp: 16 18 11 12   Temp: 98.4 F (36.9 C) 97.9 F (36.6 C) (!) 97.5 F (36.4 C) 98.4 F (36.9 C)  TempSrc: Oral Oral Axillary   SpO2: 100% 99% 94% 94%  Weight:   58.2 kg   Height:        Intake/Output Summary (Last 24 hours) at 01/12/2020 1058 Last data filed at 01/12/2020 0200 Gross per 24 hour  Intake 240 ml  Output 0 ml  Net 240 ml   Filed Weights   01/07/20 2037 01/10/20 0531 01/11/20 2000  Weight: 58.3 kg 60.8 kg 58.2 kg    Examination:  General exam: Lethargic Respiratory system: CTA Cardiovascular system: S 1, S 2 RRR Gastrointestinal system: BS present, soft, nt Central nervous system: Lethargic Extremities: no edema    Data Reviewed: I have personally reviewed following labs and imaging studies  CBC: Recent Labs  Lab 01/07/20 0833 01/08/20 0632 01/09/20 0212 01/10/20 0348 01/11/20 0332  WBC 3.8* 3.6* 3.4* 3.7* 4.5  NEUTROABS 2.4 2.4 2.5 2.7 3.1  HGB 10.8* 10.7* 10.1* 10.4* 9.9*  HCT 30.3* 30.8* 28.1* 29.6* 29.3*  MCV 89.9 93.3 92.1 92.5 93.9  PLT 116* 90* 92* 94* 440*   Basic Metabolic Panel: Recent Labs  Lab 01/07/20 0833 01/07/20 0833 01/08/20 1027 01/09/20 0212 01/10/20 0348 01/11/20 0332 01/12/20 0218  NA 126*   < > 132* 131* 134* 138 136  K 3.5   < > 3.9 3.7 3.9 3.7 3.6  CL 92*   < > 99 95* 100 101 103  CO2 20*   < > 15* 23 21* 25 24  GLUCOSE 193*   < > 111* 159* 95 100* 153*  BUN 41*   < > 40* 36* 30* 30* 29*  CREATININE 2.74*   < > 2.30* 2.14* 1.93* 1.87* 1.83*  CALCIUM 8.7*   < > 8.7* 9.0 9.4 9.4 9.4  MG 1.3*  --  2.0 2.0 2.0 1.9  --   PHOS 3.2  --  2.7 2.3* 2.5 2.9  --    < > = values in this interval not displayed.   GFR: Estimated Creatinine Clearance: 21.4 mL/min (A) (by C-G formula based on SCr of 1.83 mg/dL (H)). Liver Function Tests: Recent Labs  Lab 01/07/20 0833  01/08/20 0632 01/09/20 0212 01/10/20 0348 01/11/20 0332  AST 51* 57* 43* 49* 47*  ALT 45* 41 37 30 35  ALKPHOS 394* 388* 356* 256* 238*  BILITOT 2.4* 2.3* 2.2* 2.5* 2.9*  PROT 5.9* 5.8* 5.9* 5.7* 5.5*  ALBUMIN 2.5* 2.6* 3.3* 3.8 3.3*   Recent Labs  Lab 12/25/2019 1459  LIPASE 48   Recent Labs  Lab 12/15/2019 1459 01/09/20 0212 01/10/20 0859  AMMONIA 56* 138* 89*   Coagulation Profile: Recent Labs  Lab 01/08/20 1149  INR 2.1*   Cardiac Enzymes: No results for input(s): CKTOTAL, CKMB, CKMBINDEX, TROPONINI in the last 168 hours. BNP (last 3 results) No results for input(s): PROBNP in the last 8760 hours. HbA1C: No results for input(s): HGBA1C in the  last 72 hours. CBG: Recent Labs  Lab 01/11/20 1130 01/11/20 1348 01/11/20 1728 01/11/20 2100 01/12/20 0651  GLUCAP 66* 158* 198* 214* 138*   Lipid Profile: No results for input(s): CHOL, HDL, LDLCALC, TRIG, CHOLHDL, LDLDIRECT in the last 72 hours. Thyroid Function Tests: No results for input(s): TSH, T4TOTAL, FREET4, T3FREE, THYROIDAB in the last 72 hours. Anemia Panel: No results for input(s): VITAMINB12, FOLATE, FERRITIN, TIBC, IRON, RETICCTPCT in the last 72 hours. Sepsis Labs: Recent Labs  Lab 12/23/2019 1459 01/07/20 0833 01/07/20 1126 01/08/20 1149 01/09/20 0212  PROCALCITON  --  0.12  --  0.11 0.10  LATICACIDVEN 2.8* 1.7 2.0*  --   --     Recent Results (from the past 240 hour(s))  Culture, body fluid-bottle     Status: None   Collection Time: 12/30/2019  5:06 PM   Specimen: Fluid  Result Value Ref Range Status   Specimen Description FLUID PERITONEAL  Final   Special Requests   Final    BOTTLES DRAWN AEROBIC AND ANAEROBIC Blood Culture adequate volume   Culture   Final    NO GROWTH 5 DAYS Performed at [facility], [address] [place], [place] [address]    Report Status 01/11/2020 FINAL  Final  Gram stain     Status: None   Collection Time: 12/18/2019  5:06 PM   Specimen: Fluid  Result  Value Ref Range Status   Specimen Description FLUID PERITONEAL  Final   Special Requests NONE  Final   Gram Stain   Final    RARE WBC PRESENT, PREDOMINANTLY MONONUCLEAR NO ORGANISMS SEEN Performed at [facility], [place] [address] [place] [address], [place], [place] [address]    Report Status 12/19/2019 FINAL  Final  SARS Coronavirus 2 by RT PCR (hospital order, performed in [facility] hospital lab) Nasopharyngeal Nasopharyngeal Swab     Status: None   Collection Time: 12/20/2019  7:13 PM   Specimen: Nasopharyngeal Swab  Result Value Ref Range Status   SARS Coronavirus 2 NEGATIVE NEGATIVE Final    Comment: (NOTE) SARS-CoV-2 target nucleic acids are NOT DETECTED.  The SARS-CoV-2 RNA is generally detectable in upper and lower respiratory specimens during the acute phase of infection. The lowest concentration of SARS-CoV-2 viral copies this assay can detect is 250 copies / mL. A negative result does not preclude SARS-CoV-2 infection and should not be used as the sole basis for treatment or other patient management decisions.  A negative result may occur with improper specimen collection / handling, submission of specimen other than nasopharyngeal swab, presence of viral mutation(s) within the areas targeted by this assay, and inadequate number of viral copies (<250 copies / mL). A negative result must be combined with clinical observations, patient history, and epidemiological information.  Fact Sheet for Patients:   StrictlyIdeas.no  Fact Sheet for Healthcare Providers: BankingDealers.co.za  This test is not yet approved or  cleared by the [place] FDA and has been authorized for detection and/or diagnosis of SARS-CoV-2 by FDA under an Emergency Use Authorization (EUA).  This EUA will remain in effect (meaning this test can be used) for the duration of the COVID-19 declaration under Section 564(b)(1) of the Act, 21 U.S.C. section  360bbb-3(b)(1), unless the authorization is terminated or revoked sooner.  Performed at [facility], [place] [address], [place], [place] [address]   MRSA PCR Screening     Status: None   Collection Time: 01/07/20  4:16 AM   Specimen: Nasopharyngeal  Result Value Ref Range Status   MRSA by PCR NEGATIVE NEGATIVE Final    Comment:        The GeneXpert MRSA Assay (FDA approved for NASAL specimens only), is one component of a comprehensive MRSA colonization surveillance program. It is not intended to diagnose MRSA infection nor to guide or monitor treatment for MRSA infections. Performed at [facility], [place] [address] [place], [place] [address]          Radiology Studies: DG Abd 1 View  Result Date: 01/12/2020 CLINICAL DATA:  Abdominal distension with vomiting EXAM: ABDOMEN - 1 VIEW COMPARISON:  09/25/2019, 01/09/2020 FINDINGS: Recording device over the left lower chest. Nonobstructed gas pattern with moderate to large amount of stool in the colon. Previous right lower hernia repair. No radiopaque calculi. IMPRESSION: Nonobstructed gas pattern with moderate to large amount of stool. Electronically Signed   By: Donavan Foil M.D.   On: 01/12/2020 02:41        Scheduled Meds: . bisacodyl  10 mg Rectal Once   Continuous Infusions:    LOS: 5 days    Time spent:    Elmarie Shiley, MD [facility]   If 7PM-7AM, please contact night-coverage [email or web address]  01/12/2020, 10:58 AM   Minutes.

## 2020-01-12 NOTE — Progress Notes (Signed)
Abdominal xray results are back.  Dr. Myna Hidalgo paged and made aware.  No new orders at this time.  Patient is resting comfortably and no new vomiting.  Will continue to monitor patient.  Earleen Reaper RN

## 2020-01-12 NOTE — Progress Notes (Signed)
Patient's husband  In wheelchair,was brought in by security people.Apparently there is some family issues going on now within the family. Patient's daughters are the patient's POA. [facility] made aware.Patient's husband placed in a guest sitting room ,waiting for the outcome.

## 2020-01-12 NOTE — Progress Notes (Signed)
Daily Progress Note   Patient Name: Tonya Hunter       Date: 01/12/2020 DOB: 31-May-1934  Age: 85 y.o. MRN#: [id number] Attending Physician: Elmarie Shiley, MD Primary Care Physician: [name], MD Admit Date: 12/17/2019  Reason for Consultation/Follow-up: To discuss complex medical decision making related to patient's goals of care and specifically symptom management.  Discussed with Dr. Tyrell Antonio.  Chart reviewed.  Patient with decline in the last two days.   Now with symptoms of vomiting, hyperalgesia, labored breathing. Xray shows significant constipation. Patient is not responsive to my voice / gentle shake of her shoulder.  She withdraws in apparent pain when I very gently brush her leg with my hand.   Subjective: Talked with [name] at bedside.  Complex social situation with patient's husband vs family.  Explained to [name] that patient appears to be in the process of actively dying.  Likely hours to days.   I recommend against moving her.   [name] understands and is in agreement.  We discuss a shift to full comfort measures.  Will likely start an opioid infusion given hyperalgesia and breathing changes.  Patient's daughter [name], son in-law, and husband will likely be here today.   Assessment: Patient with decreased responsiveness, breathing changes, hyperalgesia.  Appears to be actively dying.   Patient Profile/HPI:  85 y.o. female  with past medical history of cirrhosis, CVA, T2DM, CAD, CHF, DVT, aortic stenosis, and dementia admitted on 01/07/2020 with abdominal pain. Found to have worsening hyponatremia an worsening kidney function. Concern about hepatorenal syndrome. PMT consulted to discus GOC    Length of Stay: 5   Vital Signs: BP 121/74 (BP Location: Left Arm)   Pulse  (!) 103   Temp 98.4 F (36.9 C)   Resp 12   Ht 5' 6"  (1.676 m)   Wt 58.2 kg   LMP  (LMP Unknown)   SpO2 94%   BMI 20.71 kg/m  SpO2: SpO2: 94 % O2 Device: O2 Device: Room Air O2 Flow Rate:         Palliative Assessment/Data: 10%     Palliative Care Plan    Recommendations/Plan:  Change to NPO with good oral care.  DC oral meds, insulin, lactulose  Shift to comfort approach.  Recommend patient NOT DC home but remain in the hospital.  If she stabilizes may consider Hospice House.   Code Status:  DNR  Prognosis:   Hours - Days   Discharge Planning:  To Be Determined  Care plan was discussed with Dtr [name] at bedside and Dr. Tyrell Antonio.  Thank you for allowing the Palliative Medicine Team to assist in the care of this patient.  Total time spent:  35 min.     Greater than 50%  of this time was spent counseling and coordinating care related to the above assessment and plan.  Florentina Jenny, PA-C Palliative Medicine  Please contact Palliative MedicineTeam phone at [phone] for questions and concerns between 7 am - 7 pm.   Please see AMION for individual provider pager numbers.

## 2020-01-12 NOTE — Progress Notes (Signed)
   01/12/20 0159  Provider Notification  Provider Name/Title Dr. Myna Hidalgo  Date Provider Notified 01/12/20  Time Provider Notified 0159  Notification Type Page  Notification Reason Change in status  Response See new orders  Date of Provider Response 01/12/20   Patient sleeping peacefully during first of the shift.  Opens eyes to voice but not alert enough to safely give PO medications.  At approximately 0100, bladder scan done and showed max of 67 cc urine in bladder.  Patient given complete bath and bed pads were soaked with strong smelling urine.  While turning patient, she began to vomit brown chunky vomitus.  She would take deep breath during and begin to cough.  Suction set up emergently and attempted to suction mouth.  She clenches jaw against the yankauer.  She vomited x 3.  Dr. Myna Hidalgo made aware.  Patient is now NPO.  Abdomen appears more distended.  Abdominal xray ordered.  VS are stable.  She is moaning but will not indicate where she is hurting.  IV Dilaudid and IV Zofran given with good relief.  Will continue to monitor patient.  Earleen Reaper RN

## 2020-01-13 ENCOUNTER — Telehealth: Payer: Self-pay

## 2020-01-13 NOTE — Plan of Care (Signed)
Patient is comfort care. Cheyne Stokes respirations. Does not open eyes or attempt to talk. Comfortable, no grimacing.

## 2020-01-13 NOTE — Progress Notes (Signed)
Palliative Medicine RN Note: Symptom check. Daughters at bedside. PAINAD 0. Non-responsive. Resp rate 2-4 (varied during my visit).   Family reports they think she is near the end. I explained that I agree and that prognosis is very short. At this time, family is tying up one more thing then will be ready to give her the OK to let go.   Plan made with family for PMT to follow up tomorrow if she survives the night.  Marjie Skiff Kiven Vangilder, RN, BSN, [facility] Palliative Medicine Team 01/13/2020 3:59 PM Office [phone]

## 2020-01-13 NOTE — Progress Notes (Signed)
Chaplain offered prayer and scripture reading over Tonya Hunter.    Chaplain can follow-up as needed.

## 2020-01-13 NOTE — Progress Notes (Signed)
Family requesting chaplain to come to the bedside. Chaplain paged and additional emotional support given.

## 2020-01-13 NOTE — Progress Notes (Signed)
SLP Cancellation Note  Patient Details Name: Tonya Hunter MRN: [id number] DOB: Dec 22, 1934   Cancelled treatment:       Reason Eval/Treat Not Completed: Spoke with RN who stated that the pt is comfort care.  No further skilled ST is warranted, therefore will s/o at this time.  Please re-consult if additional needs arise.     Elvia Collum Teryl Gubler 01/13/2020, 9:28 AM

## 2020-01-13 NOTE — Consult Note (Signed)
   [facility] CM Inpatient Consult   01/13/2020  Tonya Hunter 1934-07-02 [id number]   Patient was referred to [facility] prior to admission for chronic disease management services.  Patient has been assigned to a [facility].   2020/01/15 8:15 am  Medical record review of this hospitalization reveals is being followed by hospice/palliative care for transition of care.  Plan: Will alert [place] Coordinator of transition of care for for updates of transition of care needs.  Will continue to follow for transition.  Of note, [facility] Care Management services does not replace or interfere with any services that are needed or arranged by inpatient [facility] care management team.  For additional questions or referrals please contact:  Natividad Brood, RN BSN [facility] Liaison  [phone] business mobile phone Toll free office [phone]  Fax number: [phone] [name]

## 2020-01-13 NOTE — Progress Notes (Signed)
PROGRESS NOTE    Tonya Hunter  YKD:[id number] DOB: May 25, 1934 DOA: 12/17/2019 PCP: [name], MD   Brief Narrative: 85 year old past medical history significant for dementia, [name], liver cirrhosis, stroke, diabetes type 2, CAD, CHF, history of DVT, aortic stenosis, macular degeneration who presents complaining of abdominal pain.  Patient reports abdominal pain and abdominal distention.  No fever no nausea no vomiting.  She does have a chronic history of diarrhea secondary to medication. In the ED patient underwent paracentesis, without any evidence of SBP.  She was found to have worsening hyponatremia and worsening kidney function.     Assessment & Plan:   Principal Problem:   Hyponatremia Active Problems:   Diabetes mellitus type 2, insulin dependent ([place])   Goals of care, counseling/discussion   DNR (do not resuscitate)   NASH (nonalcoholic steatohepatitis)   Thrombocytopenia (HCC)   Cirrhosis of liver with ascites ([place])   History of DVT (deep vein thrombosis)   Acute kidney injury superimposed on CKD (HCC)   Generalized weakness   Acute metabolic encephalopathy   Dementia without behavioral disturbance (HCC)   Acute renal failure superimposed on stage 4 chronic kidney disease (HCC)   Severe protein-calorie malnutrition ([name]: less than 60% of standard weight) (HCC)   Abdominal pain   Comfort measures only status   Palliative care encounter   1-Acute metabolic, hepatic encephalopathy; Also underlying dementia. Patient received lactulose, enema and oral during hospitalization.  Ammonia level 56--138----89. B 12: 1190.  She continue to decline, poor oral intake.  Overnight , she probably aspirated. She has decline further and appears to be in end of life process.  Family at bedside, support offered. Transition to full comfort care.   2-Hyponatremia: Worsening sodium level on admission might be related to hypovolemia dehydration   3-Acute on CKD stage IV:  hepatorenal syndrome.  Creatinine baseline 2.1 months ago Might be related to hypovolemia but also concern with hepatorenal syndrome type I Nephrology consulted Received midodrine, albumin and octreotide midodrine.    4-Diabetes type 2: Continue with Lantus and sliding scale insulin  NASH/cirrhosis of the liver with ascites -Underwent paracentesis in the ED fluids negative for SBP -Holding spironolactone due to AKI -Follow nephrology recommendation in regards paracentesis to AKI -Meld Score; 28, 19 % estimated 3 month mortality  -palliative care consult for Goals of care. Plan to discharge home with hospice.  -underwent 2.5 L paracentesis 8/26  Thrombocytopenia: Secondary to cirrhosis.  Continue to monitor  Generalized weakness PT will require SNF  Hypomagnesemia: Rep[laced  Severe  protein caloric malnutrition: Nutrition consulted.  Liberalize diet  Metabolic acidosis; related to Renal failure.  Nephrology consulted Started on oral bicarb. Resolved. Hold oral bicarb.   Generalized pain;  Plan for oxycodone, and or IV dilaudid PRN  Estimated body mass index is 20.71 kg/m as calculated from the following:   Height as of this encounter: 5' 6"  (1.676 m).   Weight as of this encounter: 58.2 kg.   DVT prophylaxis: Eliquis Code Status: DNR Family Communication: Daughters  at bedside Disposition Plan:  Status is: Inpatient  Remains inpatient appropriate because:Hemodynamically unstable, expect Hospital death, not stable to be transfer to residential hospice.    Dispo: The patient is from: SNF              Anticipated d/c is to: Home              Anticipated d/c date is: 2 days  Patient currently is not medically stable to d/c.        Consultants:   Nephrology  Palliative care  Procedures:   Paracentesis 8/23: 250 mL aspirated  Antimicrobials:    Subjective: Non responsive, agonal breathing.    Objective: Vitals:   01/11/20 1725  01/11/20 2000 01/12/20 0205 01/13/20 0832  BP: 117/69 115/75 121/74 (!) 73/34  Pulse: 92 92 (!) 103 95  Resp: 18 11 12 15   Temp: 97.9 F (36.6 C) (!) 97.5 F (36.4 C) 98.4 F (36.9 C) 98 F (36.7 C)  TempSrc: Oral Axillary  Oral  SpO2: 99% 94% 94% 94%  Weight:  58.2 kg    Height:        Intake/Output Summary (Last 24 hours) at 01/13/2020 1328 Last data filed at 01/13/2020 1136 Gross per 24 hour  Intake 49.72 ml  Output 0 ml  Net 49.72 ml   Filed Weights   01/07/20 2037 01/10/20 0531 01/11/20 2000  Weight: 58.3 kg 60.8 kg 58.2 kg    Examination:  General exam: No responsive Respiratory system: CTA Cardiovascular system: S 1, S 2 RRR Gastrointestinal system: BS present soft.  Central nervous system: Lethargic    Data Reviewed: I have personally reviewed following labs and imaging studies  CBC: Recent Labs  Lab 01/07/20 0833 01/08/20 0632 01/09/20 0212 01/10/20 0348 01/11/20 0332  WBC 3.8* 3.6* 3.4* 3.7* 4.5  NEUTROABS 2.4 2.4 2.5 2.7 3.1  HGB 10.8* 10.7* 10.1* 10.4* 9.9*  HCT 30.3* 30.8* 28.1* 29.6* 29.3*  MCV 89.9 93.3 92.1 92.5 93.9  PLT 116* 90* 92* 94* 570*   Basic Metabolic Panel: Recent Labs  Lab 01/07/20 0833 01/07/20 0833 01/08/20 1779 01/09/20 0212 01/10/20 0348 01/11/20 0332 01/12/20 0218  NA 126*   < > 132* 131* 134* 138 136  K 3.5   < > 3.9 3.7 3.9 3.7 3.6  CL 92*   < > 99 95* 100 101 103  CO2 20*   < > 15* 23 21* 25 24  GLUCOSE 193*   < > 111* 159* 95 100* 153*  BUN 41*   < > 40* 36* 30* 30* 29*  CREATININE 2.74*   < > 2.30* 2.14* 1.93* 1.87* 1.83*  CALCIUM 8.7*   < > 8.7* 9.0 9.4 9.4 9.4  MG 1.3*  --  2.0 2.0 2.0 1.9  --   PHOS 3.2  --  2.7 2.3* 2.5 2.9  --    < > = values in this interval not displayed.   GFR: Estimated Creatinine Clearance: 21.4 mL/min (A) (by C-G formula based on SCr of 1.83 mg/dL (H)). Liver Function Tests: Recent Labs  Lab 01/07/20 0833 01/08/20 0632 01/09/20 0212 01/10/20 0348 01/11/20 0332  AST 51*  57* 43* 49* 47*  ALT 45* 41 37 30 35  ALKPHOS 394* 388* 356* 256* 238*  BILITOT 2.4* 2.3* 2.2* 2.5* 2.9*  PROT 5.9* 5.8* 5.9* 5.7* 5.5*  ALBUMIN 2.5* 2.6* 3.3* 3.8 3.3*   Recent Labs  Lab 01/08/2020 1459  LIPASE 48   Recent Labs  Lab 01/04/2020 1459 01/09/20 0212 01/10/20 0859  AMMONIA 56* 138* 89*   Coagulation Profile: Recent Labs  Lab 01/08/20 1149  INR 2.1*   Cardiac Enzymes: No results for input(s): CKTOTAL, CKMB, CKMBINDEX, TROPONINI in the last 168 hours. BNP (last 3 results) No results for input(s): PROBNP in the last 8760 hours. HbA1C: No results for input(s): HGBA1C in the last 72 hours. CBG: Recent Labs  Lab 01/11/20 1130  01/11/20 1348 01/11/20 1728 01/11/20 2100 01/12/20 0651  GLUCAP 66* 158* 198* 214* 138*   Lipid Profile: No results for input(s): CHOL, HDL, LDLCALC, TRIG, CHOLHDL, LDLDIRECT in the last 72 hours. Thyroid Function Tests: No results for input(s): TSH, T4TOTAL, FREET4, T3FREE, THYROIDAB in the last 72 hours. Anemia Panel: No results for input(s): VITAMINB12, FOLATE, FERRITIN, TIBC, IRON, RETICCTPCT in the last 72 hours. Sepsis Labs: Recent Labs  Lab 01/02/2020 1459 01/07/20 0833 01/07/20 1126 01/08/20 1149 01/09/20 0212  PROCALCITON  --  0.12  --  0.11 0.10  LATICACIDVEN 2.8* 1.7 2.0*  --   --     Recent Results (from the past 240 hour(s))  Culture, body fluid-bottle     Status: None   Collection Time: 01/03/2020  5:06 PM   Specimen: Fluid  Result Value Ref Range Status   Specimen Description FLUID PERITONEAL  Final   Special Requests   Final    BOTTLES DRAWN AEROBIC AND ANAEROBIC Blood Culture adequate volume   Culture   Final    NO GROWTH 5 DAYS Performed at [facility], [address] [place], [place] [address]    Report Status 01/11/2020 FINAL  Final  Gram stain     Status: None   Collection Time: 12/29/2019  5:06 PM   Specimen: Fluid  Result Value Ref Range Status   Specimen Description FLUID PERITONEAL  Final     Special Requests NONE  Final   Gram Stain   Final    RARE WBC PRESENT, PREDOMINANTLY MONONUCLEAR NO ORGANISMS SEEN Performed at [facility], [place] [address], [place], [place] [address]    Report Status 12/27/2019 FINAL  Final  SARS Coronavirus 2 by RT PCR (hospital order, performed in [facility] hospital lab) Nasopharyngeal Nasopharyngeal Swab     Status: None   Collection Time: 12/24/2019  7:13 PM   Specimen: Nasopharyngeal Swab  Result Value Ref Range Status   SARS Coronavirus 2 NEGATIVE NEGATIVE Final    Comment: (NOTE) SARS-CoV-2 target nucleic acids are NOT DETECTED.  The SARS-CoV-2 RNA is generally detectable in upper and lower respiratory specimens during the acute phase of infection. The lowest concentration of SARS-CoV-2 viral copies this assay can detect is 250 copies / mL. A negative result does not preclude SARS-CoV-2 infection and should not be used as the sole basis for treatment or other patient management decisions.  A negative result may occur with improper specimen collection / handling, submission of specimen other than nasopharyngeal swab, presence of viral mutation(s) within the areas targeted by this assay, and inadequate number of viral copies (<250 copies / mL). A negative result must be combined with clinical observations, patient history, and epidemiological information.  Fact Sheet for Patients:   StrictlyIdeas.no  Fact Sheet for Healthcare Providers: BankingDealers.co.za  This test is not yet approved or  cleared by the [place] FDA and has been authorized for detection and/or diagnosis of SARS-CoV-2 by FDA under an Emergency Use Authorization (EUA).  This EUA will remain in effect (meaning this test can be used) for the duration of the COVID-19 declaration under Section 564(b)(1) of the Act, 21 U.S.C. section 360bbb-3(b)(1), unless the authorization is terminated or revoked  sooner.  Performed at [facility], [place] [address], [place], [place] [address]   MRSA PCR Screening     Status: None   Collection Time: 01/07/20  4:16 AM   Specimen: Nasopharyngeal  Result Value Ref Range Status   MRSA by  PCR NEGATIVE NEGATIVE Final    Comment:        The GeneXpert MRSA Assay (FDA approved for NASAL specimens only), is one component of a comprehensive MRSA colonization surveillance program. It is not intended to diagnose MRSA infection nor to guide or monitor treatment for MRSA infections. Performed at [facility], [place] [address], [place], [place] [address]          Radiology Studies: DG Abd 1 View  Result Date: 01/12/2020 CLINICAL DATA:  Abdominal distension with vomiting EXAM: ABDOMEN - 1 VIEW COMPARISON:  09/25/2019, 01/01/2020 FINDINGS: Recording device over the left lower chest. Nonobstructed gas pattern with moderate to large amount of stool in the colon. Previous right lower hernia repair. No radiopaque calculi. IMPRESSION: Nonobstructed gas pattern with moderate to large amount of stool. Electronically Signed   By: Donavan Foil M.D.   On: 01/12/2020 02:41        Scheduled Meds: . bisacodyl  10 mg Rectal Once   Continuous Infusions: . HYDROmorphone 1.5 mg/hr (01/13/20 0600)     LOS: 6 days    Time spent:    Elmarie Shiley, MD [facility]   If 7PM-7AM, please contact night-coverage [email or web address]  01/13/2020, 1:28 PM   Minutes.

## 2020-01-14 ENCOUNTER — Other Ambulatory Visit: Payer: Self-pay | Admitting: *Deleted

## 2020-01-14 MED ORDER — GLYCOPYRROLATE 0.2 MG/ML IJ SOLN
0.4000 mg | Freq: Four times a day (QID) | INTRAMUSCULAR | Status: DC
  Administered 2020-01-14: 0.4 mg via INTRAVENOUS
  Filled 2020-01-14: qty 2

## 2020-01-15 NOTE — Patient Outreach (Signed)
[facility]) Care Management  02/03/2020  Tonya Hunter 1934/09/07 [id number]   Noted that member remains in hospital.  Per chart, she is transitioning to hospice/comfort care.  Hospital liaisons made aware, this care manager will close case at this time.  Valente David, [place], MSN [place] [phone]

## 2020-01-15 NOTE — Death Summary Note (Addendum)
Death Summary  Tonya Hunter ITG:[id number] DOB: 10-Sep-1934 DOA: January 18, 2020  PCP: [name], MD  Admit date: 01-18-2020 Date of Death: 2020-01-26  Final Diagnoses:    Decompensated Cirrhosis of liver with ascites ([place])   Hepatorenal syndrome   Hyponatremia   Aspiration PNA   Diabetes mellitus type 2, insulin dependent ([place])   Goals of care, counseling/discussion   DNR (do not resuscitate)   NASH (nonalcoholic steatohepatitis)   Thrombocytopenia ([place])   History of DVT (deep vein thrombosis)   Generalized weakness   Acute metabolic encephalopathy   Dementia without behavioral disturbance ([place])   Acute renal failure superimposed on stage 4 chronic kidney disease ([place])   Severe protein-calorie malnutrition ([name]: less than 60% of standard weight) (HCC)   Abdominal pain   Comfort measures only status   Palliative care encounter    History of present illness// Hospital Course:  85 year old past medical history significant for dementia, Tonya Hunter, liver cirrhosis, stroke, diabetes type 2, CAD, CHF, history of DVT, aortic stenosis, macular degeneration who presents complaining of abdominal pain.  Patient reports abdominal pain and abdominal distention.  No fever no nausea no vomiting.  She does have a chronic history of diarrhea secondary to medication. In the ED patient underwent paracentesis, without any evidence of SBP.  She was found to have worsening hyponatremia and worsening kidney function. Patient admitted with decompensated cirrhosis, hepatorenal syndrome, acute hepatic encephalopathy. Patient didn't improved and had aspiration event, she became more lethargic. She was transition to comfort care measure. Patient died 26-Jan-2023 at 07/22/1213.    Acute metabolic, hepatic encephalopathy: Also underlying dementia. Patient received lactulose, enema and oral during hospitalization.  Ammonia level 56--138----89. B 12: 1190.  She continue to decline, poor oral intake.  Overnight , she  probably aspirated. She has decline further and appears to be in end of life process.  Family at bedside, support offered. Patient was transition to full comfort care.   Hyponatremia: Worsening sodium level on admission might be related to hypovolemia dehydration   -Acute on CKD stage IV: hepatorenal syndrome.  Creatinine baseline 2.1 months ago Might be related to hypovolemia but also concern with hepatorenal syndrome type I Nephrology consulted Received midodrine, albumin and octreotide midodrine.    -Diabetes type 2: Continue with Lantus and sliding scale insulin  NASH/cirrhosis of the liver with ascites -Underwent paracentesis in the ED fluids negative for SBP -Holding spironolactone due to AKI -Follow nephrology recommendation in regards paracentesis to AKI -Meld Score; 28, 19 % estimated 3 month mortality  -palliative care consult for Goals of care. Plan was  discharge home with hospice, but patient decompensated further, develops aspiration event, became more lethargic. She was started on comfort care medications, dilaudid. Patient died 12;15PM/  -underwent 2.5 L paracentesis 8/26  Thrombocytopenia: Secondary to cirrhosis.  Continue to monitor  Generalized weakness PT will require SNF  Hypomagnesemia: Replaced  Severe  protein caloric malnutrition: Nutrition consulted.  Liberalize diet  Metabolic acidosis; related to Renal failure.  Nephrology consulted Started on oral bicarb. Resolved. Hold oral bicarb.   Generalized pain;  She was treated with  for oxycodone, and or IV dilaudid PRN.  Subsequently started on dilaudid gtt for end life symptoms management.    Time: 12:15 PM  Signed:  Tanyia Grabbe A Lazar Tierce  [facility] 01-26-20, 12:59 PM

## 2020-01-15 NOTE — Progress Notes (Signed)
Pt expired at 1215. Verified with charge RN.

## 2020-01-15 NOTE — Progress Notes (Signed)
Palliative Medicine RN Note: Symptom check. PAINAD 0, but pt is having audible secretions. No family present. Has not gotten any prn Robinul. Resp rate 4-6 during my assessment.  Obtained order to scheduled Robinul from Dr Hilma Favors to prevent worsening of secretions.  Marjie Skiff Koichi [place]atte, RN, BSN, [facility] Palliative Medicine Team 01-27-2020 10:48 AM Office [phone]

## 2020-01-15 NOTE — Progress Notes (Signed)
Nutrition Brief Note  Chart reviewed. Pt now transitioning to comfort care.  No further nutrition interventions warranted at this time.  Please re-consult as needed.   Larkin Ina, MS, RD, LDN RD pager number and weekend/on-call pager number located in [place].

## 2020-01-15 DEATH — deceased

## 2020-01-24 ENCOUNTER — Other Ambulatory Visit (HOSPITAL_COMMUNITY): Payer: Medicare Other

## 2020-01-24 ENCOUNTER — Encounter (HOSPITAL_COMMUNITY): Payer: Medicare Other

## 2020-01-27 ENCOUNTER — Encounter (INDEPENDENT_AMBULATORY_CARE_PROVIDER_SITE_OTHER): Payer: Medicare Other | Admitting: Ophthalmology

## 2020-02-05 ENCOUNTER — Telehealth: Payer: Medicare Other

## 2021-10-20 NOTE — Telephone Encounter (Signed)
Entered in error
